# Patient Record
Sex: Female | Born: 1945 | Race: White | Hispanic: No | Smoking: Never smoker
Health system: Southern US, Community
[De-identification: ages and names within clinical notes are randomized; demographics above are authoritative.]

## PROBLEM LIST (undated history)

## (undated) DIAGNOSIS — E785 Hyperlipidemia, unspecified: Secondary | ICD-10-CM

## (undated) DIAGNOSIS — K589 Irritable bowel syndrome without diarrhea: Secondary | ICD-10-CM

## (undated) DIAGNOSIS — G43909 Migraine, unspecified, not intractable, without status migrainosus: Secondary | ICD-10-CM

## (undated) DIAGNOSIS — K5792 Diverticulitis of intestine, part unspecified, without perforation or abscess without bleeding: Secondary | ICD-10-CM

## (undated) DIAGNOSIS — R0989 Other specified symptoms and signs involving the circulatory and respiratory systems: Secondary | ICD-10-CM

## (undated) DIAGNOSIS — K7581 Nonalcoholic steatohepatitis (NASH): Secondary | ICD-10-CM

## (undated) DIAGNOSIS — I2510 Atherosclerotic heart disease of native coronary artery without angina pectoris: Secondary | ICD-10-CM

## (undated) DIAGNOSIS — E669 Obesity, unspecified: Secondary | ICD-10-CM

## (undated) DIAGNOSIS — F419 Anxiety disorder, unspecified: Secondary | ICD-10-CM

## (undated) DIAGNOSIS — R159 Full incontinence of feces: Secondary | ICD-10-CM

## (undated) DIAGNOSIS — T8859XA Other complications of anesthesia, initial encounter: Secondary | ICD-10-CM

## (undated) DIAGNOSIS — I700 Atherosclerosis of aorta: Secondary | ICD-10-CM

## (undated) DIAGNOSIS — F329 Major depressive disorder, single episode, unspecified: Secondary | ICD-10-CM

## (undated) DIAGNOSIS — Z889 Allergy status to unspecified drugs, medicaments and biological substances status: Secondary | ICD-10-CM

## (undated) DIAGNOSIS — E119 Type 2 diabetes mellitus without complications: Secondary | ICD-10-CM

## (undated) DIAGNOSIS — F32A Depression, unspecified: Secondary | ICD-10-CM

## (undated) DIAGNOSIS — K635 Polyp of colon: Secondary | ICD-10-CM

## (undated) DIAGNOSIS — D126 Benign neoplasm of colon, unspecified: Secondary | ICD-10-CM

## (undated) DIAGNOSIS — K5790 Diverticulosis of intestine, part unspecified, without perforation or abscess without bleeding: Secondary | ICD-10-CM

## (undated) DIAGNOSIS — T4145XA Adverse effect of unspecified anesthetic, initial encounter: Secondary | ICD-10-CM

## (undated) DIAGNOSIS — K219 Gastro-esophageal reflux disease without esophagitis: Secondary | ICD-10-CM

## (undated) HISTORY — DX: Depression, unspecified: F32.A

## (undated) HISTORY — DX: Atherosclerosis of aorta: I70.0

## (undated) HISTORY — DX: Anxiety disorder, unspecified: F41.9

## (undated) HISTORY — PX: BREAST LUMPECTOMY: SHX2

## (undated) HISTORY — DX: Diverticulitis of intestine, part unspecified, without perforation or abscess without bleeding: K57.92

## (undated) HISTORY — DX: Obesity, unspecified: E66.9

## (undated) HISTORY — DX: Diverticulosis of intestine, part unspecified, without perforation or abscess without bleeding: K57.90

## (undated) HISTORY — PX: INGUINAL HERNIA REPAIR: SUR1180

## (undated) HISTORY — DX: Nonalcoholic steatohepatitis (NASH): K75.81

## (undated) HISTORY — DX: Hyperlipidemia, unspecified: E78.5

## (undated) HISTORY — DX: Other specified symptoms and signs involving the circulatory and respiratory systems: R09.89

## (undated) HISTORY — PX: TONSILLECTOMY AND ADENOIDECTOMY: SUR1326

## (undated) HISTORY — DX: Allergy status to unspecified drugs, medicaments and biological substances: Z88.9

## (undated) HISTORY — DX: Full incontinence of feces: R15.9

## (undated) HISTORY — DX: Major depressive disorder, single episode, unspecified: F32.9

## (undated) HISTORY — DX: Benign neoplasm of colon, unspecified: D12.6

## (undated) HISTORY — DX: Polyp of colon: K63.5

## (undated) HISTORY — DX: Gastro-esophageal reflux disease without esophagitis: K21.9

## (undated) HISTORY — DX: Atherosclerotic heart disease of native coronary artery without angina pectoris: I25.10

## (undated) HISTORY — DX: Irritable bowel syndrome, unspecified: K58.9

---

## 1969-05-08 HISTORY — PX: EXCISIONAL HEMORRHOIDECTOMY: SHX1541

## 1969-05-08 HISTORY — PX: ABDOMINAL HYSTERECTOMY: SHX81

## 1998-02-15 ENCOUNTER — Ambulatory Visit (HOSPITAL_COMMUNITY): Admission: RE | Admit: 1998-02-15 | Discharge: 1998-02-15 | Payer: Self-pay | Admitting: Internal Medicine

## 1998-02-19 ENCOUNTER — Ambulatory Visit: Admission: RE | Admit: 1998-02-19 | Discharge: 1998-02-19 | Payer: Self-pay | Admitting: Cardiology

## 1998-02-21 ENCOUNTER — Ambulatory Visit (HOSPITAL_COMMUNITY): Admission: RE | Admit: 1998-02-21 | Discharge: 1998-02-21 | Payer: Self-pay | Admitting: Internal Medicine

## 1998-05-30 ENCOUNTER — Ambulatory Visit (HOSPITAL_COMMUNITY): Admission: RE | Admit: 1998-05-30 | Discharge: 1998-05-30 | Payer: Self-pay | Admitting: Internal Medicine

## 1998-05-30 ENCOUNTER — Encounter: Payer: Self-pay | Admitting: Internal Medicine

## 1999-01-28 ENCOUNTER — Encounter: Payer: Self-pay | Admitting: Internal Medicine

## 1999-01-28 ENCOUNTER — Ambulatory Visit (HOSPITAL_COMMUNITY): Admission: RE | Admit: 1999-01-28 | Discharge: 1999-01-28 | Payer: Self-pay | Admitting: Internal Medicine

## 1999-01-31 ENCOUNTER — Ambulatory Visit (HOSPITAL_COMMUNITY): Admission: RE | Admit: 1999-01-31 | Discharge: 1999-01-31 | Payer: Self-pay | Admitting: Internal Medicine

## 1999-01-31 ENCOUNTER — Encounter: Payer: Self-pay | Admitting: Internal Medicine

## 2000-06-08 ENCOUNTER — Ambulatory Visit (HOSPITAL_COMMUNITY): Admission: RE | Admit: 2000-06-08 | Discharge: 2000-06-08 | Payer: Self-pay

## 2000-09-23 ENCOUNTER — Ambulatory Visit (HOSPITAL_COMMUNITY): Admission: RE | Admit: 2000-09-23 | Discharge: 2000-09-23 | Payer: Self-pay | Admitting: Surgery

## 2002-06-20 ENCOUNTER — Ambulatory Visit (HOSPITAL_COMMUNITY): Admission: RE | Admit: 2002-06-20 | Discharge: 2002-06-20 | Payer: Self-pay | Admitting: Internal Medicine

## 2002-06-23 ENCOUNTER — Encounter: Admission: RE | Admit: 2002-06-23 | Discharge: 2002-06-23 | Payer: Self-pay

## 2004-04-23 ENCOUNTER — Encounter: Payer: Self-pay | Admitting: Internal Medicine

## 2004-04-24 ENCOUNTER — Encounter: Payer: Self-pay | Admitting: Internal Medicine

## 2004-09-07 HISTORY — PX: CORONARY ARTERY BYPASS GRAFT: SHX141

## 2004-09-07 HISTORY — PX: CARDIAC CATHETERIZATION: SHX172

## 2005-06-10 ENCOUNTER — Encounter: Admission: RE | Admit: 2005-06-10 | Discharge: 2005-06-10 | Payer: Self-pay | Admitting: Orthopaedic Surgery

## 2008-06-13 ENCOUNTER — Ambulatory Visit: Payer: Self-pay | Admitting: Internal Medicine

## 2008-06-13 DIAGNOSIS — R159 Full incontinence of feces: Secondary | ICD-10-CM | POA: Insufficient documentation

## 2008-06-13 LAB — CONVERTED CEMR LAB
ALT: 28 units/L (ref 0–35)
AST: 25 units/L (ref 0–37)
Albumin: 4.2 g/dL (ref 3.5–5.2)
Alkaline Phosphatase: 53 units/L (ref 39–117)
BUN: 14 mg/dL (ref 6–23)
Basophils Absolute: 0.1 10*3/uL (ref 0.0–0.1)
Basophils Relative: 1 % (ref 0.0–3.0)
CO2: 32 meq/L (ref 19–32)
Calcium: 9.7 mg/dL (ref 8.4–10.5)
Chloride: 102 meq/L (ref 96–112)
Creatinine, Ser: 0.6 mg/dL (ref 0.4–1.2)
Eosinophils Absolute: 0.4 10*3/uL (ref 0.0–0.7)
Eosinophils Relative: 6.9 % — ABNORMAL HIGH (ref 0.0–5.0)
GFR calc Af Amer: 129 mL/min
GFR calc non Af Amer: 107 mL/min
Glucose, Bld: 103 mg/dL — ABNORMAL HIGH (ref 70–99)
HCT: 42.2 % (ref 36.0–46.0)
Hemoglobin: 14.5 g/dL (ref 12.0–15.0)
Lymphocytes Relative: 27 % (ref 12.0–46.0)
MCHC: 34.4 g/dL (ref 30.0–36.0)
MCV: 90.3 fL (ref 78.0–100.0)
Monocytes Absolute: 0.8 10*3/uL (ref 0.1–1.0)
Monocytes Relative: 12.6 % — ABNORMAL HIGH (ref 3.0–12.0)
Neutro Abs: 3.2 10*3/uL (ref 1.4–7.7)
Neutrophils Relative %: 52.5 % (ref 43.0–77.0)
Platelets: 281 10*3/uL (ref 150–400)
Potassium: 4.3 meq/L (ref 3.5–5.1)
RBC: 4.67 M/uL (ref 3.87–5.11)
RDW: 12.2 % (ref 11.5–14.6)
Sodium: 144 meq/L (ref 135–145)
Total Bilirubin: 1 mg/dL (ref 0.3–1.2)
Total Protein: 7.2 g/dL (ref 6.0–8.3)
WBC: 6.2 10*3/uL (ref 4.5–10.5)

## 2008-06-19 ENCOUNTER — Ambulatory Visit (HOSPITAL_COMMUNITY): Admission: RE | Admit: 2008-06-19 | Discharge: 2008-06-19 | Payer: Self-pay | Admitting: Internal Medicine

## 2008-06-27 ENCOUNTER — Telehealth: Payer: Self-pay | Admitting: Internal Medicine

## 2008-07-03 ENCOUNTER — Telehealth: Payer: Self-pay | Admitting: Internal Medicine

## 2009-04-04 ENCOUNTER — Encounter: Admission: RE | Admit: 2009-04-04 | Discharge: 2009-04-04 | Payer: Self-pay | Admitting: Internal Medicine

## 2009-04-30 ENCOUNTER — Encounter: Admission: RE | Admit: 2009-04-30 | Discharge: 2009-04-30 | Payer: Self-pay | Admitting: Internal Medicine

## 2009-11-29 DIAGNOSIS — M76899 Other specified enthesopathies of unspecified lower limb, excluding foot: Secondary | ICD-10-CM | POA: Insufficient documentation

## 2010-04-01 ENCOUNTER — Encounter: Admission: RE | Admit: 2010-04-01 | Discharge: 2010-04-01 | Payer: Self-pay | Admitting: Orthopedic Surgery

## 2010-06-22 ENCOUNTER — Emergency Department (HOSPITAL_COMMUNITY): Admission: EM | Admit: 2010-06-22 | Discharge: 2010-06-22 | Payer: Self-pay | Admitting: Emergency Medicine

## 2010-06-26 ENCOUNTER — Ambulatory Visit: Payer: Self-pay | Admitting: Cardiovascular Disease

## 2010-07-13 ENCOUNTER — Telehealth (INDEPENDENT_AMBULATORY_CARE_PROVIDER_SITE_OTHER): Payer: Self-pay | Admitting: *Deleted

## 2010-07-26 ENCOUNTER — Emergency Department (HOSPITAL_COMMUNITY)
Admission: EM | Admit: 2010-07-26 | Discharge: 2010-07-26 | Payer: Self-pay | Source: Home / Self Care | Admitting: Emergency Medicine

## 2010-07-28 ENCOUNTER — Ambulatory Visit: Payer: Self-pay | Admitting: Cardiovascular Disease

## 2010-09-22 ENCOUNTER — Ambulatory Visit: Payer: Self-pay | Admitting: Cardiovascular Disease

## 2010-10-07 NOTE — Progress Notes (Addendum)
  Phone Note Call from Patient Call back at Home Phone [phone]   Caller: Patient Summary of Call: Pt called this evening incredibly anxious with complaints of feeling "so sick." Her husband just got out of the hospital from a stroke and has been under a lot of stress. She felt jittery with chills and overall "sick" this evening. She took her blood pressure which was 215/100. She denies CP, SOB, visual changes, or headache but pt feels generally unwell. I advised the patient to go to the ER, but she initially refused. After review of her medicines and a recheck of her blood pressure which was 216/110, again she was strongly encouraged to go to the hospital. She agreed and is calling 911.  Initial call taken by: Melina Copa PA-C

## 2010-11-18 LAB — POCT CARDIAC MARKERS
CKMB, poc: 2.6 ng/mL (ref 1.0–8.0)
Myoglobin, poc: 118 ng/mL (ref 12–200)
Troponin i, poc: <0.05 ng/mL (ref 0.00–0.09)

## 2010-11-18 LAB — DIFFERENTIAL
Basophils Absolute: 0.1 10*3/uL (ref 0.0–0.1)
Basophils Relative: 1 % (ref 0–1)
Eosinophils Absolute: 0.1 10*3/uL (ref 0.0–0.7)
Eosinophils Relative: 2 % (ref 0–5)
Lymphocytes Relative: 24 % (ref 12–46)
Lymphs Abs: 1.4 10*3/uL (ref 0.7–4.0)
Monocytes Absolute: 0.7 10*3/uL (ref 0.1–1.0)
Monocytes Relative: 13 % — ABNORMAL HIGH (ref 3–12)
Neutro Abs: 3.4 10*3/uL (ref 1.7–7.7)
Neutrophils Relative %: 61 % (ref 43–77)

## 2010-11-18 LAB — CBC
HCT: 43.1 % (ref 36.0–46.0)
Hemoglobin: 14.6 g/dL (ref 12.0–15.0)
MCH: 31.7 pg (ref 26.0–34.0)
MCHC: 33.9 g/dL (ref 30.0–36.0)
MCV: 93.5 fL (ref 78.0–100.0)
Platelets: 238 10*3/uL (ref 150–400)
RBC: 4.61 MIL/uL (ref 3.87–5.11)
RDW: 12.9 % (ref 11.5–15.5)
WBC: 5.7 10*3/uL (ref 4.0–10.5)

## 2010-11-18 LAB — POCT I-STAT, CHEM 8
BUN: 15 mg/dL (ref 6–23)
Calcium, Ion: 1.19 mmol/L (ref 1.12–1.32)
Chloride: 105 mEq/L (ref 96–112)
Creatinine, Ser: 0.6 mg/dL (ref 0.4–1.2)
Glucose, Bld: 126 mg/dL — ABNORMAL HIGH (ref 70–99)
HCT: 44 % (ref 36.0–46.0)
Hemoglobin: 15 g/dL (ref 12.0–15.0)
Potassium: 4.5 mEq/L (ref 3.5–5.1)
Sodium: 141 mEq/L (ref 135–145)
TCO2: 27 mmol/L (ref 0–100)

## 2010-11-19 LAB — DIFFERENTIAL
Basophils Absolute: 0.1 10*3/uL (ref 0.0–0.1)
Basophils Relative: 1 % (ref 0–1)
Eosinophils Absolute: 0.1 10*3/uL (ref 0.0–0.7)
Eosinophils Relative: 1 % (ref 0–5)
Lymphocytes Relative: 16 % (ref 12–46)
Lymphs Abs: 1 10*3/uL (ref 0.7–4.0)
Monocytes Absolute: 0.5 10*3/uL (ref 0.1–1.0)
Monocytes Relative: 7 % (ref 3–12)
Neutro Abs: 5 10*3/uL (ref 1.7–7.7)
Neutrophils Relative %: 75 % (ref 43–77)

## 2010-11-19 LAB — BASIC METABOLIC PANEL
BUN: 9 mg/dL (ref 6–23)
CO2: 25 mEq/L (ref 19–32)
Calcium: 9.1 mg/dL (ref 8.4–10.5)
Chloride: 100 mEq/L (ref 96–112)
Creatinine, Ser: 0.64 mg/dL (ref 0.4–1.2)
GFR calc Af Amer: >60 mL/min (ref >60)
GFR calc non Af Amer: >60 mL/min (ref >60)
Glucose, Bld: 150 mg/dL — ABNORMAL HIGH (ref 70–99)
Potassium: 3.8 mEq/L (ref 3.5–5.1)
Sodium: 134 mEq/L — ABNORMAL LOW (ref 135–145)

## 2010-11-19 LAB — CBC
HCT: 43.2 % (ref 36.0–46.0)
Hemoglobin: 15 g/dL (ref 12.0–15.0)
MCH: 31.5 pg (ref 26.0–34.0)
MCHC: 34.7 g/dL (ref 30.0–36.0)
MCV: 90.8 fL (ref 78.0–100.0)
Platelets: 200 10*3/uL (ref 150–400)
RBC: 4.76 MIL/uL (ref 3.87–5.11)
RDW: 12.6 % (ref 11.5–15.5)
WBC: 6.6 10*3/uL (ref 4.0–10.5)

## 2010-11-19 LAB — URINALYSIS, ROUTINE W REFLEX MICROSCOPIC
Bilirubin Urine: NEGATIVE
Glucose, UA: NEGATIVE mg/dL
Hgb urine dipstick: NEGATIVE
Ketones, ur: NEGATIVE mg/dL
Nitrite: NEGATIVE
Protein, ur: NEGATIVE mg/dL
Specific Gravity, Urine: 1.009 (ref 1.005–1.030)
Urobilinogen, UA: 0.2 mg/dL (ref 0.0–1.0)
pH: 7.5 (ref 5.0–8.0)

## 2010-11-19 LAB — URINE MICROSCOPIC-ADD ON

## 2010-12-24 ENCOUNTER — Telehealth: Payer: Self-pay | Admitting: *Deleted

## 2010-12-24 NOTE — Telephone Encounter (Signed)
Please call new phone number regarding her meds

## 2010-12-24 NOTE — Telephone Encounter (Signed)
Walk in with fathers app for event monitor hook up. Pt feels bp is too low,120/58. Wants you to cut her meds back. Pt dislikes EDARBI 63m and wants to take half dose. BYSTOLIC 298MKwants to take half. She only took half today. Wants ok to do half dose. Please advise. JCorwin LevinsRN

## 2010-12-24 NOTE — Telephone Encounter (Signed)
Her BP is perfect.  Please have her continue the meds at the current dose.

## 2010-12-25 ENCOUNTER — Telehealth: Payer: Self-pay | Admitting: Cardiovascular Disease

## 2010-12-25 ENCOUNTER — Other Ambulatory Visit: Payer: Self-pay | Admitting: *Deleted

## 2010-12-25 NOTE — Telephone Encounter (Signed)
Pt states the Edarbi 24m daily is making her sick on her stomach.  Her BP is ranging from  130/50 to 140/50; she is wanting to try taking 469mof the tablet.  Per Dr. NaAcie Fredricksonthis is fine.  Continue to monitor the BP;  Pt verbalized an understanding and will call back if any problems arise.

## 2010-12-25 NOTE — Telephone Encounter (Signed)
Believes her medicine is making her blood pressure drop too low, and is feeling dizzy. Taking Edarbi, dystolyc, amlodipine. The morning pill smell is making her nauseous. Please call her cell phone. I have pulled her file.

## 2010-12-29 ENCOUNTER — Encounter: Payer: Self-pay | Admitting: Cardiovascular Disease

## 2010-12-30 ENCOUNTER — Encounter: Payer: Self-pay | Admitting: Cardiovascular Disease

## 2010-12-30 ENCOUNTER — Ambulatory Visit (INDEPENDENT_AMBULATORY_CARE_PROVIDER_SITE_OTHER): Payer: Medicare Other | Admitting: Cardiovascular Disease

## 2010-12-30 VITALS — BP 152/90 | HR 66 | Ht 64.0 in | Wt 192.2 lb

## 2010-12-30 DIAGNOSIS — Z9889 Other specified postprocedural states: Secondary | ICD-10-CM

## 2010-12-30 DIAGNOSIS — E785 Hyperlipidemia, unspecified: Secondary | ICD-10-CM | POA: Insufficient documentation

## 2010-12-30 DIAGNOSIS — E119 Type 2 diabetes mellitus without complications: Secondary | ICD-10-CM

## 2010-12-30 DIAGNOSIS — I2510 Atherosclerotic heart disease of native coronary artery without angina pectoris: Secondary | ICD-10-CM | POA: Insufficient documentation

## 2010-12-30 DIAGNOSIS — I119 Hypertensive heart disease without heart failure: Secondary | ICD-10-CM | POA: Insufficient documentation

## 2010-12-30 DIAGNOSIS — I10 Essential (primary) hypertension: Secondary | ICD-10-CM

## 2010-12-30 DIAGNOSIS — Z951 Presence of aortocoronary bypass graft: Secondary | ICD-10-CM

## 2010-12-30 MED ORDER — AZILSARTAN MEDOXOMIL 80 MG PO TABS: 80.0000 mg | ORAL_TABLET | Freq: Every day | ORAL | Status: DC

## 2010-12-30 NOTE — Progress Notes (Signed)
Ruth [name] Date of Birth  May 26, 1946 [facility] [address] [place], [name]  [address] [phone]  Fax  [phone]  History of Present Illness:  Ruth [name] seems to be doing fairly well. She tried cutting her Edarbi in half her blood pressure went right back up. [name] now on the full-strength Edarbi and is on Bystolic her blood pressure readings have been well controlled.  She is under a lot less stress now that she's moved to [place] full-time. She was previously taken care of her husband who is disabled due to a stroke.  She denies any chest pain or shortness breath. She denies any dizziness or syncope.   Current Outpatient Prescriptions  Medication Sig Dispense Refill  . amLODipine (NORVASC) 2.5 MG tablet Take 2.5 mg by mouth daily.        Marland Kitchen aspirin 500 MG tablet Take 500 mg by mouth every other day.        . Azilsartan Medoxomil (EDARBI) 40 MG TABS Take 80 mg by mouth daily.   30 tablet    . Lansoprazole (PREVACID PO) Take by mouth as needed.        . Nebivolol HCl (BYSTOLIC) 20 MG TABS Take by mouth daily.           Allergies  Allergen Reactions  . Demerol   . Iohexol   . Plavix (Clopidogrel Bisulfate)   . Sulfonamide Derivatives     No past medical history on file.  No past surgical history on file.  History  Smoking status  . Never Smoker   Smokeless tobacco  . Not on file    History  Alcohol Use No    No family history on file.  Reviw of Systems:  Reviewed in the HPI.  All other systems are negative.  Physical Exam: BP 152/90  Pulse 66  Ht 5' 4"  (1.626 m)  Wt 192 lb 3.2 oz (87.181 kg)  BMI 32.99 kg/m2 The patient is alert and oriented x 3.  The mood and affect are normal.  The skin is warm and dry.  Color is normal.  The HEENT exam reveals that the sclera are nonicteric.  The mucous membranes are moist.  The carotids are 2+ without bruits.  There is no thyromegaly.  There is no JVD.  The  lungs are clear.  The chest wall is non tender.  The heart exam reveals a regular rate with a normal S1 and S2.  There are no murmurs, gallops, or rubs.  The PMI is not displaced.   Abdominal exam reveals good bowel sounds.  There is no guarding or rebound.  There is no hepatosplenomegaly or tenderness.  There are no masses.  Exam of the legs reveal no clubbing, cyanosis, or edema.  The legs are without rashes.  The distal pulses are intact.  Cranial nerves II - XII are intact.  Motor and sensory functions are intact.  The gait is normal.  Assessment / Plan:

## 2010-12-30 NOTE — Assessment & Plan Note (Signed)
She is not having any episodes of chest pain or shortness breath.

## 2010-12-30 NOTE — Assessment & Plan Note (Signed)
Ruth [name] is doing quite a bit better on her current medications. We'll continue with her same medications. I've asked her to increase her exercise and to watch her diet.  I have encouraged her to lose weight as this will make her blood pressure control much easier.

## 2011-01-26 ENCOUNTER — Other Ambulatory Visit: Payer: Self-pay | Admitting: Internal Medicine

## 2011-01-26 DIAGNOSIS — Z1231 Encounter for screening mammogram for malignant neoplasm of breast: Secondary | ICD-10-CM

## 2011-01-28 ENCOUNTER — Ambulatory Visit
Admission: RE | Admit: 2011-01-28 | Discharge: 2011-01-28 | Disposition: A | Discharge status: Home / Self Care | Payer: Medicare Other | Source: Ambulatory Visit | Attending: Internal Medicine | Admitting: Internal Medicine

## 2011-01-28 DIAGNOSIS — Z1231 Encounter for screening mammogram for malignant neoplasm of breast: Secondary | ICD-10-CM

## 2011-03-16 ENCOUNTER — Other Ambulatory Visit: Payer: Self-pay | Admitting: Cardiovascular Disease

## 2011-03-16 NOTE — Telephone Encounter (Signed)
Fax received from pharmacy. Refill completed. Corwin Levins RN

## 2011-04-29 ENCOUNTER — Telehealth: Payer: Self-pay | Admitting: Cardiovascular Disease

## 2011-04-29 NOTE — Telephone Encounter (Signed)
Samples provided for one month and i filled out assistance paperwork for her to finish and send in, msg called to her to pick up later today. TOI;[id number], [birth year]b [birth year]

## 2011-04-29 NOTE — Telephone Encounter (Signed)
Called wanting to know if we had any samples of Edarbi 80 mg available. She is going through a divorce and can not afford to purchase the medication on her own. Please call back. I have pulled her chart.

## 2011-06-29 ENCOUNTER — Ambulatory Visit (INDEPENDENT_AMBULATORY_CARE_PROVIDER_SITE_OTHER): Payer: Medicare Other | Admitting: Cardiovascular Disease

## 2011-06-29 ENCOUNTER — Encounter: Payer: Self-pay | Admitting: Cardiovascular Disease

## 2011-06-29 VITALS — BP 177/90 | HR 66 | Ht 64.0 in | Wt 192.0 lb

## 2011-06-29 DIAGNOSIS — I2510 Atherosclerotic heart disease of native coronary artery without angina pectoris: Secondary | ICD-10-CM

## 2011-06-29 DIAGNOSIS — I10 Essential (primary) hypertension: Secondary | ICD-10-CM

## 2011-06-29 DIAGNOSIS — E785 Hyperlipidemia, unspecified: Secondary | ICD-10-CM

## 2011-06-29 NOTE — Assessment & Plan Note (Addendum)
Her blood pressure readings remain just a little bit high. Overall this is fairly normal for her. [name] been doing fairly well on the dog. We'll continue to try to obtain samples for her.  [name] unable to get samples, we may have to change her to a generic medication like valsartan or Losartan

## 2011-06-29 NOTE — Assessment & Plan Note (Signed)
She has history of CAD. We'll check her lipids, basic metabolic profile, and hepatic profile at her next visit in 6 months.

## 2011-06-29 NOTE — Progress Notes (Signed)
[place] Date of Birth  04/29/1946 [facility] [address]    [place], [place]  [address] [phone]  Fax  [phone]  History of Present Illness:  65 year old female with a history of coronary artery bypass grafting and history of hypertension. His overall done fairly well. Her mother recently fell and broke her back and so she's been very busy helping her mom. [name] not been getting as much because she would like.  Her blood pressure measurements at home are typically normal.  Current Outpatient Prescriptions on File Prior to Visit  Medication Sig Dispense Refill  . amLODipine (NORVASC) 2.5 MG tablet take 1 tablet by mouth once daily  30 tablet  6  . aspirin 500 MG tablet Take 500 mg by mouth. Taking Every Other Day      . Azilsartan Medoxomil (EDARBI) 80 MG TABS Take 80 mg by mouth daily.  30 tablet  11  . Lansoprazole (PREVACID PO) Take by mouth as needed.        . Nebivolol HCl (BYSTOLIC) 20 MG TABS Take by mouth daily.          Allergies  Allergen Reactions  . Demerol   . Iohexol   . Plavix (Clopidogrel Bisulfate)   . Shrimp Flavor   . Sulfonamide Derivatives     No past medical history on file.  No past surgical history on file.  History  Smoking status  . Never Smoker   Smokeless tobacco  . Not on file    History  Alcohol Use No    No family history on file.  Reviw of Systems:  Reviewed in the HPI.  All other systems are negative.  Physical Exam: BP 177/90  Pulse 66  Ht 5' 4"  (1.626 m)  (She has not taken her by solid get today.)  The patient is alert and oriented x 3.  The mood and affect are normal.   Skin: warm and dry.  Color is normal.    HEENT:   No JVD. Her carotids are 2+.  Lungs: Lungs are clear to auscultation   Heart: Regular rate S1-S2. Her PMI is nondisplaced.    Abdomen: Abdominal exam bowel sounds. She is no hepatosplenomegaly.  Extremities:  No clubbing cyanosis or edema. She has no palpable  cords.  Neuro:  Nonfocal. Gait is normal.     ECG: Normal sinus rhythm. She has nonspecific ST-T wave changes.  Assessment / Plan:

## 2011-06-29 NOTE — Assessment & Plan Note (Signed)
She remains pain-free. We will continue with her same medications.

## 2011-06-29 NOTE — Patient Instructions (Addendum)
Your physician wants you to follow-up in: 6 months, You will receive a reminder letter in the mail two months in advance. If you don't receive a letter, please call our office to schedule the follow-up appointment.  Your physician recommends that you return for a FASTING lipid profile: 6 months  Your physician recommends that you continue on your current medications as directed. Please refer to the Current Medication list given to you today.  Gave one months of samples / edarbi 80 mg

## 2011-07-24 ENCOUNTER — Other Ambulatory Visit: Payer: Self-pay | Admitting: *Deleted

## 2011-07-24 DIAGNOSIS — I10 Essential (primary) hypertension: Secondary | ICD-10-CM

## 2011-07-24 MED ORDER — AZILSARTAN MEDOXOMIL 80 MG PO TABS: 80.0000 mg | ORAL_TABLET | Freq: Every day | ORAL | Status: DC

## 2011-07-24 NOTE — Telephone Encounter (Signed)
Adv. Refilled [name]

## 2011-07-24 NOTE — Telephone Encounter (Signed)
Refilled edarbi 52m. To [facility]

## 2011-08-04 ENCOUNTER — Telehealth: Payer: Self-pay | Admitting: Cardiovascular Disease

## 2011-08-04 NOTE — Telephone Encounter (Signed)
Patient called because her B/P has been high 204/104 this AM. After taken Bystolic medication her  B/P is 180/94. Also patient C/O of muscle cramps,she thinks is the Edarbi 80 mg. Patient  Refused to be seen by the PN, patient would like to  see  Dr. Acie Fredrickson. An appointment was made with MD  on Thursday 08/06/11 at 9:15 Am patient aware.

## 2011-08-04 NOTE — Telephone Encounter (Signed)
New message:  Pt is staying with mom at the hospital and her blood pressure is elevated.  Also she is having muscle cramps and wonder if the Edarbi would be causing this.  BP is running 204/104

## 2011-08-06 ENCOUNTER — Encounter: Payer: Self-pay | Admitting: Cardiovascular Disease

## 2011-08-06 ENCOUNTER — Ambulatory Visit (INDEPENDENT_AMBULATORY_CARE_PROVIDER_SITE_OTHER): Payer: Medicare Other | Admitting: Cardiovascular Disease

## 2011-08-06 VITALS — BP 140/80 | HR 64 | Ht 64.0 in | Wt 188.4 lb

## 2011-08-06 DIAGNOSIS — I10 Essential (primary) hypertension: Secondary | ICD-10-CM

## 2011-08-06 DIAGNOSIS — E785 Hyperlipidemia, unspecified: Secondary | ICD-10-CM

## 2011-08-06 LAB — HEPATIC FUNCTION PANEL
Alkaline Phosphatase: 54 U/L (ref 39–117)
Bilirubin, Direct: 0 mg/dL (ref 0.0–0.3)
Total Bilirubin: 0.9 mg/dL (ref 0.3–1.2)
Total Protein: 7.6 g/dL (ref 6.0–8.3)

## 2011-08-06 LAB — LIPID PANEL
HDL: 39.1 mg/dL (ref >39.00)
Total CHOL/HDL Ratio: 7
VLDL: 70.8 mg/dL — ABNORMAL HIGH (ref 0.0–40.0)

## 2011-08-06 LAB — BASIC METABOLIC PANEL
GFR: 98.21 mL/min (ref >60.00)
Potassium: 4.2 mEq/L (ref 3.5–5.1)
Sodium: 143 mEq/L (ref 135–145)

## 2011-08-06 LAB — LDL CHOLESTEROL, DIRECT: Direct LDL: 168.8 mg/dL

## 2011-08-06 MED ORDER — NEBIVOLOL HCL 20 MG PO TABS: 20.0000 mg | ORAL_TABLET | Freq: Two times a day (BID) | ORAL | Status: DC

## 2011-08-06 MED ORDER — AMLODIPINE BESYLATE 5 MG PO TABS: 5.0000 mg | ORAL_TABLET | Freq: Every day | ORAL | Status: DC

## 2011-08-06 NOTE — Progress Notes (Signed)
[place] Date of Birth  [birth year]  [facility] [address]    [place], [place]  [address] [phone]  Fax  [phone]  History of Present Illness:  65 year old female with a history of coronary artery bypass grafting and history of hypertension. His overall done fairly well. Her mother recently fell and broke her back and so she's been very busy helping her mom. [name] not been getting as much because she would like.  She has been sleeping at the [place] with her mom for the past month   Current Outpatient Prescriptions on File Prior to Visit  Medication Sig Dispense Refill  . amLODipine (NORVASC) 2.5 MG tablet take 1 tablet by mouth once daily  30 tablet  6  . aspirin 500 MG tablet Take 500 mg by mouth. Taking Twice a Week      . Azilsartan Medoxomil (EDARBI) 80 MG TABS Take 1 tablet (80 mg total) by mouth daily.  30 tablet  11  . Lansoprazole (PREVACID PO) Take by mouth as needed.        . Nebivolol HCl (BYSTOLIC) 20 MG TABS Take by mouth daily.          Allergies  Allergen Reactions  . Demerol   . Iohexol   . Plavix (Clopidogrel Bisulfate)   . Red Dye   . Shrimp Flavor   . Sulfonamide Derivatives     No past medical history on file.  No past surgical history on file.  History  Smoking status  . Never Smoker   Smokeless tobacco  . Not on file    History  Alcohol Use No    No family history on file.  Reviw of Systems:  Reviewed in the HPI.  All other systems are negative.  Physical Exam: BP 140/80  Pulse 64  Ht 5' 4"  (1.626 m)  Wt 188 lb 6.4 oz (85.458 kg)  BMI 32.34 kg/m2  (She has not taken her by solid get today.)  The patient is alert and oriented x 3.  The mood and affect are normal.   Skin: warm and dry.  Color is normal.    HEENT:   No JVD. Her carotids are 2+.  Lungs: Lungs are clear to auscultation   Heart: Regular rate S1-S2. Her PMI is nondisplaced.    Abdomen: Abdominal exam bowel sounds. She is no  hepatosplenomegaly.  Extremities:  No clubbing cyanosis or edema. She has no palpable cords.  Neuro:  Nonfocal. Gait is normal.    ECG: Assessment / Plan:

## 2011-08-06 NOTE — Assessment & Plan Note (Signed)
[name] not tolerating her ARB quite as well as she used to. We'll increase her Norvasc to 5 mg a day and will increase her Bystolic to 20 mg  twice a day.  I'll see her again in 3 months.  I have told her that she needs to rest. She may need to stay at her house rather than the hospital so that she gets enough rest.  She needs to exercise

## 2011-08-06 NOTE — Patient Instructions (Signed)
Your physician recommends that you schedule a follow-up appointment in: 3 months app made  Your physician has recommended you make the following change in your medication:  Stop edarbi Start bystolic 20 mg one tab twice daily Increase norvasc to 5 mg daily

## 2011-08-07 NOTE — Progress Notes (Signed)
Pt states she will work on diet and exercise but is not willing to start Lipitor.  She states "it's not going to happen" regarding the Lipitor.

## 2011-08-09 ENCOUNTER — Telehealth: Payer: Self-pay | Admitting: Physician Assistant

## 2011-08-09 NOTE — Telephone Encounter (Signed)
The patient called because she was having chest pain. The pain is in her upper left chest close to her left shoulder. She feels that this is gas. She took a Tums which only helped a little. She was concerned. The pain seems to get worse with deep inspiration. She denies any exertional symptoms. She is not short of breath. She wanted to know what to do. I advised her that she could call 911 and come to the emergency room but she stated she could not leave her mother. I then asked her what medications she had that she could take at home and we discussed over-the-counter GI medications. She wanted to know she could take Prevacid. I advised her that this was slower in onset than medication such as Mylanta but she could take it. Patient stated the pain was not like her previous cardiac symptoms. The patient seemed reassured and I advised we would contact her on Monday for followup.

## 2011-11-05 ENCOUNTER — Encounter: Payer: Self-pay | Admitting: Cardiovascular Disease

## 2011-11-05 ENCOUNTER — Ambulatory Visit (INDEPENDENT_AMBULATORY_CARE_PROVIDER_SITE_OTHER): Payer: Medicare Other | Admitting: Cardiovascular Disease

## 2011-11-05 DIAGNOSIS — I2510 Atherosclerotic heart disease of native coronary artery without angina pectoris: Secondary | ICD-10-CM

## 2011-11-05 DIAGNOSIS — I10 Essential (primary) hypertension: Secondary | ICD-10-CM

## 2011-11-05 DIAGNOSIS — R252 Cramp and spasm: Secondary | ICD-10-CM

## 2011-11-05 LAB — MAGNESIUM: Magnesium: 1.9 mg/dL (ref 1.5–2.5)

## 2011-11-05 LAB — BASIC METABOLIC PANEL
BUN: 14 mg/dL (ref 6–23)
Calcium: 9.8 mg/dL (ref 8.4–10.5)
GFR: 133.56 mL/min (ref >60.00)
Glucose, Bld: 107 mg/dL — ABNORMAL HIGH (ref 70–99)
Potassium: 4.4 mEq/L (ref 3.5–5.1)
Sodium: 140 mEq/L (ref 135–145)

## 2011-11-05 NOTE — Assessment & Plan Note (Signed)
She status post coronary artery bypass grafting. [name] not having any angina.

## 2011-11-05 NOTE — Progress Notes (Signed)
    Ruth [name] Date of Birth  05/12/1946 [address]    [place]   [place], [place]  [address]    [place], [place]  [address] [phone]  Fax  [phone]  [phone]  Fax [phone]  Problem list: 1. Coronary artery disease-status post CABG- 2006 2. Hypertension 3. Hypercholesterolemia   History of Present Illness:  Ruth [name] has done fairly well since I last saw her. [name] been under lots of stress. Her husband died this past Christmas. [name] also had patent help her mother who is not in good health right now.  [name] been eating a little bit of extra salt because she's been traveling so much and has not had time to cook for herself.  [name] been having some leg cramps particularly at night.  Current Outpatient Prescriptions on File Prior to Visit  Medication Sig Dispense Refill  . amLODipine (NORVASC) 5 MG tablet Take 1 tablet (5 mg total) by mouth daily.  30 tablet  11  . aspirin 500 MG tablet Take 500 mg by mouth. Taking Twice a Week      . Lansoprazole (PREVACID PO) Take by mouth as needed.        . Nebivolol HCl (BYSTOLIC) 20 MG TABS Take 1 tablet (20 mg total) by mouth 2 (two) times daily.  60 tablet  11    Allergies  Allergen Reactions  . Demerol   . Iohexol   . Plavix (Clopidogrel Bisulfate)   . Red Dye   . Shrimp Flavor   . Sulfonamide Derivatives     No past medical history on file.  No past surgical history on file.  History  Smoking status  . Never Smoker   Smokeless tobacco  . Not on file    History  Alcohol Use No    No family history on file.  Reviw of Systems:  Reviewed in the HPI.  All other systems are negative.  Physical Exam: Blood pressure 140/85, pulse 67, height 5' 3.5" (1.613 m), weight 187 lb 12.8 oz (85.186 kg). General: Well developed, well nourished, in no acute distress.  Head: Normocephalic, atraumatic, sclera non-icteric, mucus membranes are moist,    Neck: Supple. Carotids are 2 + without bruits. No JVD  Lungs: Clear bilaterally to auscultation.  Heart: regular rate  With normal  S1 S2. No murmurs, gallops or rubs.  Abdomen: Soft, non-tender, non-distended with normal bowel sounds. No hepatomegaly. No rebound/guarding. No masses.  Msk:  Strength and tone are normal  Extremities: No clubbing or cyanosis. No edema.  Distal pedal pulses are 2+ and equal bilaterally.  Neuro: Alert and oriented X 3. Moves all extremities spontaneously.  Psych:  Responds to questions appropriately with a normal affect.  ECG:  Assessment / Plan:

## 2011-11-05 NOTE — Patient Instructions (Signed)
Your physician wants you to follow-up in: 6 months  You will receive a reminder letter in the mail two months in advance. If you don't receive a letter, please call our office to schedule the follow-up appointment.   Your physician recommends that you return for lab work in: today   Your physician recommends that you return for a FASTING lipid profile: 6 months

## 2011-11-05 NOTE — Assessment & Plan Note (Signed)
Ruth [name] is doing quite well. [name] not had any further episodes of chest pain. Her blood pressures little elevated when she first comes into the office but it tends to get down quite nicely after she has had here for a little while. She'll continue to exercise on her regular basis.

## 2011-11-24 DIAGNOSIS — M791 Myalgia, unspecified site: Secondary | ICD-10-CM | POA: Insufficient documentation

## 2012-01-11 ENCOUNTER — Other Ambulatory Visit: Payer: Self-pay | Admitting: Internal Medicine

## 2012-01-11 DIAGNOSIS — R3129 Other microscopic hematuria: Secondary | ICD-10-CM | POA: Insufficient documentation

## 2012-01-11 DIAGNOSIS — N63 Unspecified lump in unspecified breast: Secondary | ICD-10-CM | POA: Insufficient documentation

## 2012-01-13 ENCOUNTER — Ambulatory Visit
Admission: RE | Admit: 2012-01-13 | Discharge: 2012-01-13 | Disposition: A | Discharge status: Home / Self Care | Payer: Medicare Other | Source: Ambulatory Visit | Attending: Internal Medicine | Admitting: Internal Medicine

## 2012-01-13 ENCOUNTER — Other Ambulatory Visit: Payer: Self-pay | Admitting: Internal Medicine

## 2012-01-13 ENCOUNTER — Encounter: Payer: Self-pay | Admitting: *Deleted

## 2012-03-01 ENCOUNTER — Telehealth: Payer: Self-pay | Admitting: Cardiovascular Disease

## 2012-03-01 NOTE — Telephone Encounter (Signed)
Samples avail, pt informed

## 2012-03-01 NOTE — Telephone Encounter (Signed)
New msg Pt wants samples of bystolic 20 mg. She will be in [place] today

## 2012-05-16 ENCOUNTER — Encounter: Payer: Self-pay | Admitting: Cardiovascular Disease

## 2012-05-20 ENCOUNTER — Telehealth: Payer: Self-pay | Admitting: Internal Medicine

## 2012-05-20 NOTE — Telephone Encounter (Signed)
Dr. Olevia Perches the patient would like to change to you because the rest of her family see you.  Will you accept?

## 2012-05-20 NOTE — Telephone Encounter (Signed)
Dr. Gessner do you approve of change? 

## 2012-05-20 NOTE — Telephone Encounter (Signed)
OK 

## 2012-05-20 NOTE — Telephone Encounter (Signed)
ok 

## 2012-05-23 ENCOUNTER — Encounter: Payer: Self-pay | Admitting: Internal Medicine

## 2012-06-06 ENCOUNTER — Telehealth: Payer: Self-pay | Admitting: Internal Medicine

## 2012-06-06 ENCOUNTER — Encounter: Payer: Self-pay | Admitting: *Deleted

## 2012-06-06 NOTE — Telephone Encounter (Signed)
Spoke with patient and she states she has had left sided abdominal pain for several weeks and alternates with diarrhea and constipation. She also reports stomach "swelling." States she has IBS.  Offered patient appointment today with extender which she refused. States" I only want to see Dr. Olevia Perches because she sees my father." Scheduled patient on 06/21/12 at 10:00 AM. Patient understands to call back if she is worse.

## 2012-06-07 ENCOUNTER — Telehealth: Payer: Self-pay | Admitting: Cardiovascular Disease

## 2012-06-07 NOTE — Telephone Encounter (Signed)
lmtcb,

## 2012-06-07 NOTE — Telephone Encounter (Signed)
New Problem:    Patient called because she feels like she has gas, is belching, and has some chest pains.  Patient is unsure if this has to do with her heart.  Patient believes that it has to do with her stomach but would like to be seen asap to be evaluated.  When offered an appointment with a NP/PA, patient refused to be seen by anyone but her physician.  Please call back.

## 2012-06-08 ENCOUNTER — Telehealth: Payer: Self-pay | Admitting: Cardiovascular Disease

## 2012-06-08 NOTE — Telephone Encounter (Signed)
Pt refuses to be seen sooner by PA/NP, pt told to go to ER with worsening symptoms, pt states" I think its just gas but I'm not sure" has app to establish with Dr Olevia Perches also for stomach upset. Describes gas/ belching/ fullness that goes into chest and arms. Tried to convince her to be seen by another provider, Pt declined. Will route to Dr Acie Fredrickson

## 2012-06-08 NOTE — Telephone Encounter (Signed)
Pt rtn call to jodette from yesterday

## 2012-06-14 ENCOUNTER — Telehealth: Payer: Self-pay | Admitting: Internal Medicine

## 2012-06-14 NOTE — Telephone Encounter (Signed)
Moved patient to Tye Savoy, NP on 06/15/12 at 10:30 AM.

## 2012-06-15 ENCOUNTER — Ambulatory Visit (INDEPENDENT_AMBULATORY_CARE_PROVIDER_SITE_OTHER): Payer: Medicare Other | Admitting: Nurse Practitioner

## 2012-06-15 ENCOUNTER — Encounter: Payer: Self-pay | Admitting: Nurse Practitioner

## 2012-06-15 VITALS — BP 164/98 | HR 60 | Ht 63.5 in | Wt 193.0 lb

## 2012-06-15 DIAGNOSIS — R140 Abdominal distension (gaseous): Secondary | ICD-10-CM | POA: Insufficient documentation

## 2012-06-15 DIAGNOSIS — Z800 Family history of malignant neoplasm of digestive organs: Secondary | ICD-10-CM

## 2012-06-15 DIAGNOSIS — R1084 Generalized abdominal pain: Secondary | ICD-10-CM | POA: Insufficient documentation

## 2012-06-15 DIAGNOSIS — R143 Flatulence: Secondary | ICD-10-CM

## 2012-06-15 MED ORDER — HYOSCYAMINE SULFATE 0.125 MG PO TABS: ORAL_TABLET | ORAL | Status: DC

## 2012-06-15 MED ORDER — ALIGN PO CAPS: 1.0000 | ORAL_CAPSULE | Freq: Every day | ORAL | Status: DC

## 2012-06-15 NOTE — Patient Instructions (Addendum)
Please call us if you are able to find the most recent colonoscopy information.  We would need a copy of it. We have had you sign a release of information form . Call us with the contact information and we will fax it to them.    We have given you samples of a probiotic Align, take 1 capsule daily for 14 days. We sent a prescription for Hyoscyamine tablets for cramping to [facility].

## 2012-06-15 NOTE — Progress Notes (Signed)
06/15/2012 Ruth [name] [id number] [birth year]   HISTORY OF PRESENT ILLNESS: Patient is a 66 year old female who saw Dr. Carlean Purl October 2009 for evaluation of chronic left lower quadrant pain. She  wanted to be seen today for abdominal pain, distention, gas and a clear discharge from her rectum. Her symptoms started 3 weeks ago. Her bowel movements are unchanged in consistency but she does often have a hard time expelling the stool when she tries to have a BM. Other times she is incontinent of stool, both liquid and formed. Abdominal pain is constant, diffuse, it is worse with meals. She does get some relief with defecation. No nausea or vomiting. She has chronic LLQ pain but this is different in that it is more diffuse. She has gas in her chest and has recently started OTC Prevacid and it helping.     Patient has a history of colon cancer in her father. We have her colonoscopy report from [place] done June 2005 with findings of diverticulosis and a small sessile polyp removed from the transverse colon. Patient believes she had a repeat colonoscopy at [facility] approximately 4 years ago, I do not have those results.    Past Medical History  Diagnosis Date  . CAD (coronary artery disease)   . Labile hypertension   . Dyslipidemia   . Diabetes mellitus   . Steatohepatitis   . Diverticulosis   . IBS (irritable bowel syndrome)   . Obesity   . Hyperplastic colon polyp   . Fecal incontinence    Past Surgical History  Procedure Date  . Coronary artery bypass graft 2006  . Inguinal hernia repair   . Partial hysterectomy   . Hemorrhoid surgery   . Breast lumpectomy     bilateral  . Head lumpectomy     reports that she has never smoked. She has never used smokeless tobacco. She reports that she does not drink alcohol or use illicit drugs. family history includes Breast cancer in an unspecified family member; Colon cancer in her father; Colon polyps in her father; Coronary artery  disease in her father; Diabetes in her maternal grandmother and paternal grandmother; and Hypertension in an unspecified family member. Allergies  Allergen Reactions  . Codeine Anaphylaxis  . Demerol   . Iohexol   . Plavix (Clopidogrel Bisulfate)   . Red Dye   . Shrimp Flavor   . Sulfonamide Derivatives       Outpatient Encounter Prescriptions as of 06/15/2012  Medication Sig Dispense Refill  . amLODipine (NORVASC) 5 MG tablet Take 1 tablet (5 mg total) by mouth daily.  30 tablet  11  . aspirin 500 MG tablet Take 500 mg by mouth. Taking Twice a Week      . calcium carbonate (TUMS - DOSED IN MG ELEMENTAL CALCIUM) 500 MG chewable tablet Chew 1 tablet by mouth as needed.      . lansoprazole (PREVACID) 15 MG capsule Take 15 mg by mouth daily.      . Nebivolol HCl (BYSTOLIC) 20 MG TABS Take 1 tablet (20 mg total) by mouth 2 (two) times daily.  60 tablet  11  . DISCONTD: Lansoprazole (PREVACID PO) Take by mouth as needed.           REVIEW OF SYSTEMS  : Positive for anxiety. Positive for swelling in left neck.  All other systems reviewed and negative except where noted in the History of Present Illness.   PHYSICAL EXAM: BP 164/98  Pulse 60  Ht 5' 3.5" (  1.613 m)  Wt 193 lb (87.544 kg)  BMI 33.65 kg/m2 General: Well developed white female in no acute distress Head: Normocephalic and atraumatic Eyes:  sclerae anicteric,conjunctive pink. Ears: Normal auditory acuity Neck: Supple, no masses felt. Bilateral clavicular fat pads, L>R. Lungs: Clear throughout to auscultation Heart: Regular rate and rhythm Abdomen: Soft, obese, non distended. Mild left lower quadrant tenderness to palpation No masses or hepatomegaly noted. Normal bowel sounds Rectal: 2-3 inch superficial abrasion on in her right buttock, several old hemorrhoidal tags. Minimal light brown stool in vault which is heme-negative. Decreased sphincter tone. Musculoskeletal: Symmetrical with no gross deformities  Skin: No lesions  on visible extremities Extremities: No edema  Neurological: Alert oriented x 4, grossly nonfocal Cervical Nodes:  No significant cervical adenopathy Psychological:  Anxious and emotional but pleasant.   ASSESSMENT AND PLAN:  1. Chronic LLQ pain, unchanged  2. Three week history of postprandial bloating, diffuse abdominal discomfort. Her abdominal exam is not overly concerning. Patient has a history of irritable bowel syndrome, she is under significant stress with two ill parents. Suspect her GI symptoms are functional in nature. She has diabetes, small bowel bacterial overgrowth is a possibility. Will try a course of align. If this is ineffective will consider Xifaxan. For discomfort patient will try Hyoscyamine. She will return for a followup visit with Dr. Olevia Perches in a month or so.  2. Diverticulosis  3. [facility] of colon cancer in father in his 68's. Patient had a colonoscopy in [place] in 2005, see HPI. Patient believes she may have had a colonoscopy at [facility] about 4 years ago, I do not have those results. I asked patient to find out whether she had a colonoscopy and get the results to [place]. If her last colonoscopy was in 2005 then she is due for a surveillance colonoscopy given family history of colon cancer in father.  4. multichemical sensitivity. Multiple medication allergies  as well.   5. Anxiety

## 2012-06-16 DIAGNOSIS — K649 Unspecified hemorrhoids: Secondary | ICD-10-CM | POA: Insufficient documentation

## 2012-06-16 DIAGNOSIS — L98499 Non-pressure chronic ulcer of skin of other sites with unspecified severity: Secondary | ICD-10-CM | POA: Insufficient documentation

## 2012-06-17 ENCOUNTER — Encounter: Payer: Self-pay | Admitting: Nurse Practitioner

## 2012-06-20 ENCOUNTER — Encounter: Payer: Self-pay | Admitting: Cardiovascular Disease

## 2012-06-20 ENCOUNTER — Ambulatory Visit (INDEPENDENT_AMBULATORY_CARE_PROVIDER_SITE_OTHER): Payer: Medicare Other | Admitting: Cardiovascular Disease

## 2012-06-20 VITALS — BP 148/80 | HR 76 | Ht 63.5 in | Wt 195.8 lb

## 2012-06-20 DIAGNOSIS — I10 Essential (primary) hypertension: Secondary | ICD-10-CM

## 2012-06-20 DIAGNOSIS — E785 Hyperlipidemia, unspecified: Secondary | ICD-10-CM

## 2012-06-20 DIAGNOSIS — I2510 Atherosclerotic heart disease of native coronary artery without angina pectoris: Secondary | ICD-10-CM

## 2012-06-20 DIAGNOSIS — E119 Type 2 diabetes mellitus without complications: Secondary | ICD-10-CM

## 2012-06-20 MED ORDER — AMLODIPINE BESYLATE 5 MG PO TABS: 2.5000 mg | ORAL_TABLET | Freq: Every day | ORAL | Status: DC

## 2012-06-20 NOTE — Assessment & Plan Note (Signed)
Ruth [name] is doing well. [name] not had any episodes of chest pain. We'll continue with her same medications. We'll have her return sometime this week for fasting labs.

## 2012-06-20 NOTE — Assessment & Plan Note (Signed)
Her blood pressure seems to be well-controlled. She takes a fairly complex regimen of Bystolic  but so far it seems to be working for her.

## 2012-06-20 NOTE — Patient Instructions (Addendum)
Your physician wants you to follow-up in: 6 months You will receive a reminder letter in the mail two months in advance. If you don't receive a letter, please call our office to schedule the follow-up appointment.  Your physician recommends that you return for a FASTING lipid profile: tomorrow fasting/ may drink water.

## 2012-06-20 NOTE — Progress Notes (Signed)
[name] Date of Birth  November 22, 1945 [place] [address]    [place], [place]  [address]    [place],   [id number] [phone]  Fax  [phone]  [phone]  Fax [phone]  Problem list: 1. Coronary artery disease-status post CABG- 2006 2. Hypertension 3. Hypercholesterolemia   History of Present Illness:  Kenslee has done fairly well since I last saw her. [name] been under lots of stress. Her husband died this past Christmas. [name] also had patent help her mother who is not in good health right now.  [name] been eating a little bit of extra salt because she's been traveling so much and has not had time to cook for herself.  [name] been having some leg cramps particularly at night.  She has had some episodes of HTN for which she takes an extra Norvasc.  She takes Bystolic 3 times a day for a total of 25 mg a day.  Current Outpatient Prescriptions on File Prior to Visit  Medication Sig Dispense Refill  . amLODipine (NORVASC) 5 MG tablet Take 1 tablet (5 mg total) by mouth daily.  30 tablet  11  . aspirin 500 MG tablet Take 500 mg by mouth. Taking Twice a Week      . bifidobacterium infantis (ALIGN) capsule Take 1 capsule by mouth daily.  14 capsule  0  . calcium carbonate (TUMS - DOSED IN MG ELEMENTAL CALCIUM) 500 MG chewable tablet Chew 1 tablet by mouth as needed.      . hyoscyamine (LEVSIN, ANASPAZ) 0.125 MG tablet Take 1 tab every morning as needed for cramping and abdominal spasms.  30 tablet  0  . lansoprazole (PREVACID) 15 MG capsule Take 15 mg by mouth daily.      Marland Kitchen DISCONTD: Nebivolol HCl (BYSTOLIC) 20 MG TABS Take 1 tablet (20 mg total) by mouth 2 (two) times daily.  60 tablet  11    Allergies  Allergen Reactions  . Codeine Anaphylaxis  . Demerol   . Iohexol   . Plavix (Clopidogrel Bisulfate)   . Red Dye   . Shrimp Flavor   . Sulfonamide Derivatives     Past Medical History    Diagnosis Date  . CAD (coronary artery disease)   . Labile hypertension   . Dyslipidemia   . Diabetes mellitus   . Steatohepatitis   . Diverticulosis   . IBS (irritable bowel syndrome)   . Obesity   . Hyperplastic colon polyp   . Fecal incontinence   . Multiple allergies     Past Surgical History  Procedure Date  . Coronary artery bypass graft 2006  . Inguinal hernia repair   . Partial hysterectomy   . Hemorrhoid surgery   . Breast lumpectomy     bilateral  . Head lumpectomy     History  Smoking status  . Never Smoker   Smokeless tobacco  . Never Used    History  Alcohol Use No    Family History  Problem Relation Age of Onset  . Coronary artery disease Father   . Hypertension    . Colon cancer Father   . Colon polyps Father   . Breast cancer      grandmother  . Diabetes Maternal Grandmother   . Diabetes Paternal Grandmother     Reviw of Systems:  Reviewed in the HPI.  All other systems are negative.  Physical  Exam: Blood pressure 148/80, pulse 76, height 5' 3.5" (1.613 m), weight 195 lb 12.8 oz (88.814 kg). General: Well developed, well nourished, in no acute distress.  Head: Normocephalic, atraumatic, sclera non-icteric, mucus membranes are moist,   Neck: Supple. Carotids are 2 + without bruits. No JVD  Lungs: Clear bilaterally to auscultation.  Heart: regular rate  With normal  S1 S2. No murmurs, gallops or rubs.  Abdomen: Soft, non-tender, non-distended with normal bowel sounds. No hepatomegaly. No rebound/guarding. No masses.  Msk:  Strength and tone are normal  Extremities: No clubbing or cyanosis. No edema.  Distal pedal pulses are 2+ and equal bilaterally.  Neuro: Alert and oriented X 3. Moves all extremities spontaneously.  Psych:  Responds to questions appropriately with a normal affect.  ECG: Oct. 14, 2013 - NSR at 107. No ST / T wave changes Assessment / Plan:

## 2012-06-21 ENCOUNTER — Ambulatory Visit: Payer: Medicare Other | Admitting: Internal Medicine

## 2012-06-21 ENCOUNTER — Ambulatory Visit (INDEPENDENT_AMBULATORY_CARE_PROVIDER_SITE_OTHER): Payer: Medicare Other | Admitting: *Deleted

## 2012-06-21 DIAGNOSIS — I10 Essential (primary) hypertension: Secondary | ICD-10-CM

## 2012-06-21 DIAGNOSIS — E785 Hyperlipidemia, unspecified: Secondary | ICD-10-CM

## 2012-06-21 DIAGNOSIS — I2510 Atherosclerotic heart disease of native coronary artery without angina pectoris: Secondary | ICD-10-CM

## 2012-06-21 LAB — BASIC METABOLIC PANEL
BUN: 14 mg/dL (ref 6–23)
Chloride: 101 mEq/L (ref 96–112)
Creatinine, Ser: 0.6 mg/dL (ref 0.4–1.2)

## 2012-06-21 LAB — HEPATIC FUNCTION PANEL
AST: 22 U/L (ref 0–37)
Albumin: 3.9 g/dL (ref 3.5–5.2)
Alkaline Phosphatase: 50 U/L (ref 39–117)

## 2012-06-21 LAB — LIPID PANEL
Cholesterol: 241 mg/dL — ABNORMAL HIGH (ref 0–200)
Total CHOL/HDL Ratio: 8

## 2012-06-27 ENCOUNTER — Encounter: Payer: Self-pay | Admitting: Cardiovascular Disease

## 2012-07-04 ENCOUNTER — Encounter: Payer: Self-pay | Admitting: Internal Medicine

## 2012-07-04 ENCOUNTER — Other Ambulatory Visit (INDEPENDENT_AMBULATORY_CARE_PROVIDER_SITE_OTHER): Payer: Medicare Other

## 2012-07-04 ENCOUNTER — Ambulatory Visit (INDEPENDENT_AMBULATORY_CARE_PROVIDER_SITE_OTHER): Payer: Medicare Other | Admitting: Internal Medicine

## 2012-07-04 VITALS — BP 200/104 | HR 72 | Ht 62.75 in | Wt 194.4 lb

## 2012-07-04 DIAGNOSIS — R1013 Epigastric pain: Secondary | ICD-10-CM

## 2012-07-04 MED ORDER — LANSOPRAZOLE 30 MG PO CPDR: 30.0000 mg | DELAYED_RELEASE_CAPSULE | Freq: Every day | ORAL | Status: DC

## 2012-07-04 NOTE — Progress Notes (Signed)
Ruth [name] [birth year] MRN [id number]   History of Present Illness:  This is a 66 year old white female with bloating, epigastric discomfort and pain. She saw Ruth Savoy, NP on 06/15/2012 and was started on probiotic Align,, Prevacid and Levsin SL. She took only align but not the Prevacid. She has multiple medical problems. She had coronary artery bypass graft in 2006. She also  has diabetes, fatty liver, obesity, family history of colon cancer in her father, constipation as well as diarrhea and fecal incontinence. She had a colonoscopy in [place] in 2005 which showed a polyp. A repeat colonoscopy was completed in 2010, according to the patient, at [facility], however, we have no report of that. There is a positive family history of gallbladder disease in her maternal grandmother. She denies dysphagia or odynophagia. She has multiple drug allergies and sensitivities. Her husband passed away last Christmas and she has been very upset.   Past Medical History  Diagnosis Date  . CAD (coronary artery disease)   . Labile hypertension   . Dyslipidemia   . Diabetes mellitus   . Steatohepatitis   . Diverticulosis   . IBS (irritable bowel syndrome)   . Obesity   . Hyperplastic colon polyp   . Fecal incontinence   . Multiple allergies    Past Surgical History  Procedure Date  . Coronary artery bypass graft 2006  . Inguinal hernia repair   . Partial hysterectomy   . Hemorrhoid surgery   . Breast lumpectomy     bilateral  . Head lumpectomy     reports that she has never smoked. She has never used smokeless tobacco. She reports that she does not drink alcohol or use illicit drugs. family history includes Breast cancer in an unspecified family member; Colon cancer in her father; Colon polyps in her father; Coronary artery disease in her father; Diabetes in her maternal grandmother and paternal grandmother; and Hypertension in an unspecified family member. Allergies    Allergen Reactions  . Codeine Anaphylaxis  . Demerol   . Iohexol   . Plavix (Clopidogrel Bisulfate)   . Red Dye   . Shrimp Flavor   . Sulfonamide Derivatives         Review of Systems: Bloating distention. Abdominal pain. Fecal incontinence  The remainder of the 10 point ROS is negative except as outlined in H&P   Physical Exam: General appearance  Well developed, in no distress. Eyes- non icteric. HEENT nontraumatic, normocephalic. Mouth no lesions, tongue papillated, no cheilosis. Neck supple without adenopathy, thyroid not enlarged, no carotid bruits, no JVD. Lungs Clear to auscultation bilaterally. Post thoracotomy scar Cor normal S1, normal S2, regular rhythm, no murmur,  quiet precordium. Abdomen: Protuberant soft with normoactive bowel sounds. Tenderness in epigastrium. Liver edge at costal margin tenderness in left lower quadrant. Rectal: Decreased rectal sphincter tone. Small external hemorrhoids. Soft Hemoccult negative stool. Extremities no pedal edema. Skin no lesions. Neurological alert and oriented x 3. Psychological normal mood and affect.  Assessment and Plan:  Problem #57 66 year old white female with nonspecific bloating and dyspepsia which could be multifactorial. I have asked her to take the Prevacid on a daily basis and will send her prescription for it. It could be biliary as well, so we will order an upper abdominal ultrasound. She is reluctant to undergo an upper endoscopy so we will go forward with an upper GI series instead which will show if she has a hiatal hernia.or any structural abnormality, she knows it will  not show subtle lesions.  Problem #2 Colorectal screening. We are still awaiting reports from 2010 colonoscopy. She has a positive family history of colon cancer in her father. She ought to be due for a repeat colonoscopy in 2015.  Problem #3 Decreased rectal sphincter tone, most likely postpartum. She had two large babies vaginally. She is  also a diabetic which may be negatively impacting the rectal sphincter tone. She will continue on probiotics and a high fiber diet.   07/04/2012 Ruth [name]

## 2012-07-04 NOTE — Patient Instructions (Addendum)
You have been scheduled for an Abdominal ultrasound and an Upper GI Series at [facility] (1st floor). Your appointment is on 07/11/12 at 9:00 am. Please arrive 15 minutes prior to your test for registration. Make sure not to eat or drink anything after midnight on the night before your test. If you need to reschedule, please call radiology at [phone]. ________________________________________________________________ An upper GI series uses x rays to help diagnose problems of the upper GI tract, which includes the esophagus, stomach, and duodenum. The duodenum is the first part of the small intestine. An upper GI series is conducted by a radiology technologist or a radiologist-a doctor who specializes in x-ray imaging-at a hospital or outpatient center. While sitting or standing in front of an x-ray machine, the patient drinks barium liquid, which is often white and has a chalky consistency and taste. The barium liquid coats the lining of the upper GI tract and makes signs of disease show up more clearly on x rays. X-ray video, called fluoroscopy, is used to view the barium liquid moving through the esophagus, stomach, and duodenum. Additional x rays and fluoroscopy are performed while the patient lies on an x-ray table. To fully coat the upper GI tract with barium liquid, the technologist or radiologist may press on the abdomen or ask the patient to change position. Patients hold still in various positions, allowing the technologist or radiologist to take x rays of the upper GI tract at different angles. If a technologist conducts the upper GI series, a radiologist will later examine the images to look for problems.  This test typically takes about 1 hour to complete. __________________________________________________________________ We have sent the following medications to your pharmacy for you to pick up at your convenience: [place] has requested that you go to the  basement for the following lab work before leaving today: H Pylori  We have given you samples of Align. This puts good bacteria back into your colon. You should take 1 capsule by mouth once daily. If this works well for you, it can be purchased over the counter.  CC: Dr Elta Guadeloupe Perini,Dr Cathie Olden

## 2012-07-05 ENCOUNTER — Telehealth: Payer: Self-pay | Admitting: *Deleted

## 2012-07-05 NOTE — Telephone Encounter (Signed)
Called and left a message for [facility] [employer] that we sent a signed release on 10-9 and have not received the Colon and path from 2010.  I advised I faxed the request again today 07-05-2012 and asked they please fax these records.  I also left my name and number in case they need to call me.

## 2012-07-07 ENCOUNTER — Emergency Department (HOSPITAL_COMMUNITY)
Admission: EM | Admit: 2012-07-07 | Discharge: 2012-07-08 | Disposition: A | Discharge status: Home / Self Care | Payer: Medicare Other | Attending: Emergency Medicine | Admitting: Emergency Medicine

## 2012-07-07 ENCOUNTER — Encounter (HOSPITAL_COMMUNITY): Payer: Self-pay | Admitting: *Deleted

## 2012-07-07 ENCOUNTER — Emergency Department (HOSPITAL_COMMUNITY): Payer: Medicare Other

## 2012-07-07 DIAGNOSIS — R079 Chest pain, unspecified: Secondary | ICD-10-CM | POA: Insufficient documentation

## 2012-07-07 DIAGNOSIS — Z79899 Other long term (current) drug therapy: Secondary | ICD-10-CM | POA: Insufficient documentation

## 2012-07-07 DIAGNOSIS — Z8719 Personal history of other diseases of the digestive system: Secondary | ICD-10-CM | POA: Insufficient documentation

## 2012-07-07 DIAGNOSIS — M549 Dorsalgia, unspecified: Secondary | ICD-10-CM | POA: Insufficient documentation

## 2012-07-07 DIAGNOSIS — I2510 Atherosclerotic heart disease of native coronary artery without angina pectoris: Secondary | ICD-10-CM | POA: Insufficient documentation

## 2012-07-07 DIAGNOSIS — E785 Hyperlipidemia, unspecified: Secondary | ICD-10-CM | POA: Insufficient documentation

## 2012-07-07 DIAGNOSIS — R109 Unspecified abdominal pain: Secondary | ICD-10-CM | POA: Insufficient documentation

## 2012-07-07 DIAGNOSIS — I10 Essential (primary) hypertension: Secondary | ICD-10-CM | POA: Insufficient documentation

## 2012-07-07 DIAGNOSIS — E119 Type 2 diabetes mellitus without complications: Secondary | ICD-10-CM | POA: Insufficient documentation

## 2012-07-07 DIAGNOSIS — K589 Irritable bowel syndrome without diarrhea: Secondary | ICD-10-CM | POA: Insufficient documentation

## 2012-07-07 DIAGNOSIS — Z951 Presence of aortocoronary bypass graft: Secondary | ICD-10-CM | POA: Insufficient documentation

## 2012-07-07 LAB — CBC WITH DIFFERENTIAL/PLATELET
Basophils Absolute: 0 10*3/uL (ref 0.0–0.1)
Basophils Relative: 1 % (ref 0–1)
Eosinophils Relative: 2 % (ref 0–5)
HCT: 42.8 % (ref 36.0–46.0)
MCHC: 34.8 g/dL (ref 30.0–36.0)
MCV: 89.4 fL (ref 78.0–100.0)
Monocytes Absolute: 0.6 10*3/uL (ref 0.1–1.0)
RDW: 12.5 % (ref 11.5–15.5)

## 2012-07-07 LAB — COMPREHENSIVE METABOLIC PANEL
AST: 23 U/L (ref 0–37)
Albumin: 4 g/dL (ref 3.5–5.2)
CO2: 27 mEq/L (ref 19–32)
Calcium: 10 mg/dL (ref 8.4–10.5)
Creatinine, Ser: 0.68 mg/dL (ref 0.50–1.10)
GFR calc non Af Amer: 88 mL/min — ABNORMAL LOW (ref >90)

## 2012-07-07 LAB — TROPONIN I: Troponin I: <0.3 ng/mL (ref <0.30)

## 2012-07-07 LAB — LIPASE, BLOOD: Lipase: 46 U/L (ref 11–59)

## 2012-07-07 NOTE — ED Notes (Signed)
Per EMS:  Pt has had epigastric pain and bloating for the last couple of months.  Pt started having chest pain 45 in ago (previous CABG), pt became diaphoretic and nauseous, pt became SOB.  Pt has 1g of Aspirin (1 558m this am and 1 504mwhen the pain started).  Pt refused NTG from EMS.  20g present L hand.  Pain is SSCP, pain does not radiate anywhere, pt describes pain as dull pain.

## 2012-07-07 NOTE — ED Provider Notes (Signed)
History     CSN: [id number]  Arrival date & time 07/07/12  2115   First MD Initiated Contact with Patient 07/07/12 2120      Chief Complaint - abdominal pain   Patient is a 66 y.o. female presenting with abdominal pain. The history is provided by the patient.  Abdominal Pain The primary symptoms of the illness include abdominal pain and nausea. The current episode started more than 2 days ago. The onset of the illness was gradual. The problem has been gradually worsening.  Additional symptoms associated with the illness include back pain.  eating/drinking makes symptoms worse  Pt presents with abd pain and chest pain The patient reports she has had epigastric pain for over 3 weeks - it is daily pain and worse with food She reports f/u with GI physician with abdominal US pending as outpatient She reports today her abdominal pain progressed into chest pain.  She also felt SOB and diaphoretic.  She took ASA and her BP meds and her symptoms improved She does not usually get CP on a regular basis.  Her CP is improved at this time  No fever.  No vomting.  No constipation.  No dysuria.  No rectal bleeding or melena No focal weakness At times the abdominal pain will move into her back  Past Medical History  Diagnosis Date  . CAD (coronary artery disease)   . Labile hypertension   . Dyslipidemia   . Diabetes mellitus   . Steatohepatitis   . Diverticulosis   . IBS (irritable bowel syndrome)   . Obesity   . Hyperplastic colon polyp   . Fecal incontinence   . Multiple allergies     Past Surgical History  Procedure Date  . Coronary artery bypass graft 2006  . Inguinal hernia repair   . Partial hysterectomy   . Hemorrhoid surgery   . Breast lumpectomy     bilateral  . Head lumpectomy     Family History  Problem Relation Age of Onset  . Coronary artery disease Father   . Hypertension    . Colon cancer Father   . Colon polyps Father   . Breast cancer      grandmother  .  Diabetes Maternal Grandmother   . Diabetes Paternal Grandmother     History  Substance Use Topics  . Smoking status: Never Smoker   . Smokeless tobacco: Never Used  . Alcohol Use: No    OB History    Grav Para Term Preterm Abortions TAB SAB Ect Mult Living                  Review of Systems  Gastrointestinal: Positive for nausea and abdominal pain.  Musculoskeletal: Positive for back pain.  All other systems reviewed and are negative.    Allergies  Codeine; Shrimp flavor; Demerol; Iohexol; Plavix; Red dye; and Sulfonamide derivatives  Home Medications   Current Outpatient Rx  Name Route Sig Dispense Refill  . AMLODIPINE BESYLATE 5 MG PO TABS Oral Take 0.5 tablets (2.5 mg total) by mouth daily. 30 tablet 11  . ASPIRIN 500 MG PO TABS Oral Take 500 mg by mouth. Taking Twice a Week    . ALIGN PO CAPS Oral Take 1 capsule by mouth daily. 14 capsule 0    Lot # [id number] Exp date: 06/2012  . CALCIUM CARBONATE ANTACID 500 MG PO CHEW Oral Chew 1 tablet by mouth 2 (two) times daily as needed. For heartburn    . LANSOPRAZOLE  30 MG PO CPDR Oral Take 1 capsule (30 mg total) by mouth daily. 90 capsule 3  . NEBIVOLOL HCL 5 MG PO TABS Oral Take 10-15 mg by mouth 2 (two) times daily. Take 15 mg in the morning and 20 mg at night      BP 182/85  Pulse 78  Temp 98.8 F (37.1 C)  Resp 16  SpO2 97%   Physical Exam CONSTITUTIONAL: Well developed/well nourished HEAD AND FACE: Normocephalic/atraumatic EYES: EOMI/PERRL ENMT: Mucous membranes moist NECK: supple no meningeal signs SPINE:entire spine nontender CV: S1/S2 noted, no murmurs/rubs/gallops noted LUNGS: Lungs are clear to auscultation bilaterally, no apparent distress ABDOMEN: soft, epigastric tenderness, tenderness is moderate, no rebound or guarding GU:no cva tenderness NEURO: Pt is awake/alert, moves all extremitiesx4 EXTREMITIES: pulses normal, full ROM SKIN: warm, color normal PSYCH: no abnormalities of mood  noted  ED Course  Procedures    Labs Reviewed  CBC WITH DIFFERENTIAL  COMPREHENSIVE METABOLIC PANEL  LIPASE, BLOOD  TROPONIN I   10:50 PM Pt here for worsening abd pain for over 3 weeks now with CP/SOB.  She has cardiac history with h/o CABG, so ACS is possible, however cholelithiasis is also possible.  Will initiate labs/imaging.  12:12 AM Pt improved We discussed imaging/labs.  She will continue with GI workup as outpatient including UGI as scheduled by dr Olevia Perches. Given she had CP earlier tonight and h/o CAD, would recommend repeat EKG troponin at 1230am (approximately 3 hrs after arrival and CP had resolved and approximately 4 hrs since pain started) She has had no further CP here.   It is possible that her CP was referred from abdominal pain. She can f/u with dr Acie Fredrickson next week since she had CP but do not feel she needs admission if pain does not return here and repeat labs/ekg negative Initial EKG did not show any ischemic changes D/w dr Randal Buba to f/u repeat ekg/labs   MDM  Nursing notes including past medical history and social history reviewed and considered in documentation xrays reviewed and considered Labs/vital reviewed and considered Previous records reviewed and considered - recent GI visit reviewed.  [place] ordered as outpatient        Date: 07/07/2012  Rate: 68  Rhythm: normal sinus rhythm  QRS Axis: normal  Intervals: normal  ST/T Wave abnormalities: nonspecific ST changes  Conduction Disutrbances:none  Narrative Interpretation:   Old EKG Reviewed: unchanged    Sharyon Cable, MD 07/08/12 [phone]

## 2012-07-08 ENCOUNTER — Telehealth: Payer: Self-pay | Admitting: Cardiovascular Disease

## 2012-07-08 ENCOUNTER — Telehealth: Payer: Self-pay | Admitting: Internal Medicine

## 2012-07-08 LAB — TROPONIN I: Troponin I: <0.3 ng/mL (ref <0.30)

## 2012-07-08 NOTE — Telephone Encounter (Signed)
Patient calling to let us know she went to ED last night due abdominal pain. States her BP was up too. She states she had a ultrasound done and per patient "it showed I had lots of gas."  She states she is still having abdominal pain-"feels like my stomach is on fire." Pain is increased with eating or movement. She wanted to be sure we cancelled the ultrasound scheduled on Monday since she already had one.  She is going to have the UGI series on Monday. She states she is holding on until then.

## 2012-07-08 NOTE — Telephone Encounter (Signed)
Patient notified of recommendations. 

## 2012-07-08 NOTE — Telephone Encounter (Signed)
New Problem:    Patient called in because she was seen at the ER last night because her BP was 220/120 and she wanted to know if she could increase her amLODipine (NORVASC) 5 MG tablet to the full 79m dose instead of continuing to only take 2.585m  Please call back.

## 2012-07-08 NOTE — Telephone Encounter (Signed)
I have reviewed her ultrasound. CBD is prominent. Could be gall bladder problem but her LFT"s last night were normal. Keep the appointment for UGI.

## 2012-07-08 NOTE — Telephone Encounter (Signed)
**Note De-Identified Ruth [name] Obfuscation** Pt. went to ER last night for chest/abdominal pain and states that her BP was 220/120. She is concerned that BP is too high this am at 176/88 and states that at her last OV with Dr. Acie Fredrickson on 06/27/12 she was advised to take additional 2.5 mg of Amlodipine for elevated BP. She wants to know if she should take 5 mg at one time or 2.5 mg twice daily. Pt. Is advised to take 2.5 mg this am and 2.5 mg this evening. She verbalized understanding.

## 2012-07-11 ENCOUNTER — Telehealth: Payer: Self-pay | Admitting: Internal Medicine

## 2012-07-11 ENCOUNTER — Ambulatory Visit (HOSPITAL_COMMUNITY)
Admission: RE | Admit: 2012-07-11 | Discharge: 2012-07-11 | Disposition: A | Discharge status: Home / Self Care | Payer: Medicare Other | Source: Ambulatory Visit | Attending: Internal Medicine | Admitting: Internal Medicine

## 2012-07-11 ENCOUNTER — Ambulatory Visit (HOSPITAL_COMMUNITY): Payer: Medicare Other

## 2012-07-11 DIAGNOSIS — R142 Eructation: Secondary | ICD-10-CM | POA: Insufficient documentation

## 2012-07-11 DIAGNOSIS — R141 Gas pain: Secondary | ICD-10-CM | POA: Insufficient documentation

## 2012-07-11 DIAGNOSIS — R1013 Epigastric pain: Secondary | ICD-10-CM | POA: Insufficient documentation

## 2012-07-11 NOTE — Telephone Encounter (Signed)
Spoke with patient and results given see results note.

## 2012-07-12 ENCOUNTER — Other Ambulatory Visit: Payer: Self-pay | Admitting: *Deleted

## 2012-07-12 MED ORDER — METOCLOPRAMIDE HCL 5 MG PO TABS: ORAL_TABLET | ORAL | Status: DC

## 2012-07-14 ENCOUNTER — Other Ambulatory Visit: Payer: Self-pay | Admitting: *Deleted

## 2012-07-14 DIAGNOSIS — I2510 Atherosclerotic heart disease of native coronary artery without angina pectoris: Secondary | ICD-10-CM

## 2012-07-14 DIAGNOSIS — R079 Chest pain, unspecified: Secondary | ICD-10-CM

## 2012-07-14 NOTE — Progress Notes (Signed)
PT WAS WITH FAMILY MEMBER FOR OV, DURING OV SHE MENTIONED SHE HAD CP AND WAS SEEN IN ER, DR NAHSER ORDERED  STRESS TEST.

## 2012-07-15 ENCOUNTER — Ambulatory Visit: Payer: Medicare Other | Admitting: Cardiovascular Disease

## 2012-07-19 ENCOUNTER — Telehealth: Payer: Self-pay | Admitting: Cardiovascular Disease

## 2012-07-19 ENCOUNTER — Ambulatory Visit: Payer: Medicare Other | Admitting: Internal Medicine

## 2012-07-19 NOTE — Telephone Encounter (Signed)
Pt has elevated 220/109 and she is very concerned and wants to talk to someone

## 2012-07-19 NOTE — Telephone Encounter (Signed)
Pt informed and will accept plan but will call back if she feels meds need to be changed.

## 2012-07-19 NOTE — Telephone Encounter (Signed)
No changes, continue to take an extra amlodipine on occasion.

## 2012-07-19 NOTE — Telephone Encounter (Signed)
Spoke with pt. Pt states her BP was elevated last night, the highest reading was 220/106. Pt took extra 44m of amlodipine and her BP has gradually come down. This morning her BP was 125/72. Pt is not having any symptoms this morning except her stomach hurts. She feels this may be related to bystolic. Pt usually takes bystolic 106UJ AM , 134MG6 PM , and amlodipine 2.5 around 10:30pm. Pt feels she is having side effects from bystolic and would like to know if it can be changed to another medication. She is also asking if other adjustments should be made to her medication given her recent high BP readings. Pt requesting try cell phone [phone] not at home number. I will forward to Dr NAcie Fredricksonfor review and recommendations.

## 2012-07-21 ENCOUNTER — Ambulatory Visit (HOSPITAL_COMMUNITY): Payer: Medicare Other | Attending: Cardiovascular Disease | Admitting: Radiology

## 2012-07-21 VITALS — BP 191/87 | HR 72 | Ht 63.0 in | Wt 188.0 lb

## 2012-07-21 DIAGNOSIS — I2510 Atherosclerotic heart disease of native coronary artery without angina pectoris: Secondary | ICD-10-CM

## 2012-07-21 DIAGNOSIS — E119 Type 2 diabetes mellitus without complications: Secondary | ICD-10-CM | POA: Insufficient documentation

## 2012-07-21 DIAGNOSIS — I10 Essential (primary) hypertension: Secondary | ICD-10-CM | POA: Insufficient documentation

## 2012-07-21 DIAGNOSIS — I999 Unspecified disorder of circulatory system: Secondary | ICD-10-CM | POA: Insufficient documentation

## 2012-07-21 DIAGNOSIS — R079 Chest pain, unspecified: Secondary | ICD-10-CM

## 2012-07-21 DIAGNOSIS — R0789 Other chest pain: Secondary | ICD-10-CM | POA: Insufficient documentation

## 2012-07-21 DIAGNOSIS — R0602 Shortness of breath: Secondary | ICD-10-CM | POA: Insufficient documentation

## 2012-07-21 DIAGNOSIS — Z8249 Family history of ischemic heart disease and other diseases of the circulatory system: Secondary | ICD-10-CM | POA: Insufficient documentation

## 2012-07-21 MED ORDER — TECHNETIUM TC 99M SESTAMIBI GENERIC - CARDIOLITE
10.0000 | Freq: Once | INTRAVENOUS | Status: AC | PRN
  Administered 2012-07-21: 10 via INTRAVENOUS

## 2012-07-21 MED ORDER — TECHNETIUM TC 99M SESTAMIBI GENERIC - CARDIOLITE
30.0000 | Freq: Once | INTRAVENOUS | Status: AC | PRN
  Administered 2012-07-21: 30 via INTRAVENOUS

## 2012-07-21 MED ORDER — REGADENOSON 0.4 MG/5ML IV SOLN
0.4000 mg | Freq: Once | INTRAVENOUS | Status: AC
  Administered 2012-07-21: 0.4 mg via INTRAVENOUS

## 2012-07-21 NOTE — Progress Notes (Signed)
[facility] [id number] [place] [address] [phone]  Cardiology Nuclear Med Study  Ruth [name] is a 66 y.o. female     MRN : [id number]     DOB: 04/23/1946  Procedure Date: 07/21/2012  Nuclear Med Background Indication for Stress Test:  Evaluation for Ischemia and Graft Patency History:  '06 CABG; '10 Normal, EF=62%; 07/07/12 [facility] with Chest/Abdominal Pain Cardiac Risk Factors: Family History - CAD, Hypertension, Lipids, NIDDM and Obesity  Symptoms:  Chest Pressure/Tightness with and without Exertion (last episode of chest discomfort is now, 2/10), Diaphoresis, DOE/SOB and Nausea    Nuclear Pre-Procedure Caffeine/Decaff Intake:  None NPO After: 6:00pm   Lungs:  Clear. O2 Sat: 97% on room air. IV 0.9% NS with Angio Cath:  22g  IV Site: L Antecubital  IV Started by:  Perrin Maltese, EMT-P  Chest Size (in):  38 Cup Size: D  Height: 5' 3"  (1.6 m)  Weight:  188 lb (85.276 kg)  BMI:  Body mass index is 33.30 kg/(m^2). Tech Comments:  Bystolic held x 24 hours    Nuclear Med Study 1 or 2 day study: 1 day  Stress Test Type:  Lexiscan  Reading MD: Mertie Moores, MD  Order Authorizing Provider:  Mertie Moores, MD  Resting Radionuclide: Technetium 51mSestamibi  Resting Radionuclide Dose: 11.0 mCi   Stress Radionuclide:  Technetium 96mestamibi  Stress Radionuclide Dose: 32.8 mCi           Stress Protocol Rest HR: 72 Stress HR: 109  Rest BP: 191/87 Stress BP: 184/70  Exercise Time (min): n/a METS: n/a   Predicted Max HR: 152 bpm % Max HR: 71.71 bpm Rate Pressure Product: 20056   Dose of Adenosine (mg):  n/a Dose of Lexiscan: 0.4 mg  Dose of Atropine (mg): n/a Dose of Dobutamine: n/a mcg/kg/min (at max HR)  Stress Test Technologist: ShLetta MoynahanCMA-N  Nuclear Technologist:  StCharlton AmorCNMT     Rest Procedure:  Myocardial perfusion imaging was performed at rest 45 minutes following the intravenous  administration of Technetium 9969mstamibi.  Rest ECG: No acute changes.  Stress Procedure:  The patient received IV Lexiscan 0.4 mg over 15-seconds.  Technetium 47m28mtamibi was injected at 30-seconds.  There were nonspecific T-wave changes and rare PAC's with Lexiscan.  She did c/o chest pain with Lexiscan.  Quantitative spect images were obtained after a 45- minute delay.  Stress ECG: No significant change from baseline ECG  QPS Raw Data Images:  Normal; no motion artifact; normal heart/lung ratio. Stress Images:  There is a large moderately severe defect in the mid-basal inferolateral wall.            Rest Images:  Normal homogeneous uptake in all areas of the myocardium. Subtraction (SDS):  There is evidence of a large area of moderatly severe ischemia in the mid-basal inferolateral wall    Transient Ischemic Dilatation (Normal <1.22):  1.06 Lung/Heart Ratio (Normal <0.45):  0.45  Quantitative Gated Spect Images QGS EDV:  70 ml QGS ESV:  20 ml  Impression Exercise Capacity:  Lexiscan with no exercise. BP Response:  Normal blood pressure response. Clinical Symptoms:  No significant symptoms noted. ECG Impression:  No significant ST segment change suggestive of ischemia. Comparison with Prior Nuclear Study: No images to compare  Overall Impression:  Abnormal stress nuclear study.  She has had episodes of angina.  She has a large area of moderate ischemia in the  lateral wall.   LV Ejection Fraction: 71%.  LV Wall Motion:  NL LV Function; NL Wall Motion.    Thayer Headings, Brooke Bonito., MD, [facility] 07/21/2012, 6:03 PM Office - [phone] Pager [phone]

## 2012-07-26 ENCOUNTER — Encounter (HOSPITAL_COMMUNITY): Payer: Self-pay | Admitting: Pharmacy Technician

## 2012-07-26 ENCOUNTER — Encounter: Payer: Self-pay | Admitting: Nurse Practitioner

## 2012-07-26 ENCOUNTER — Telehealth: Payer: Self-pay | Admitting: Cardiovascular Disease

## 2012-07-26 ENCOUNTER — Encounter: Payer: Self-pay | Admitting: *Deleted

## 2012-07-26 ENCOUNTER — Ambulatory Visit (INDEPENDENT_AMBULATORY_CARE_PROVIDER_SITE_OTHER): Payer: Medicare Other | Admitting: Nurse Practitioner

## 2012-07-26 VITALS — BP 180/90 | HR 80 | Resp 20 | Ht 63.5 in | Wt 192.0 lb

## 2012-07-26 DIAGNOSIS — R079 Chest pain, unspecified: Secondary | ICD-10-CM

## 2012-07-26 LAB — BASIC METABOLIC PANEL
BUN: 18 mg/dL (ref 6–23)
CO2: 29 mEq/L (ref 19–32)
Calcium: 9.7 mg/dL (ref 8.4–10.5)
Chloride: 100 mEq/L (ref 96–112)
Creatinine, Ser: 0.8 mg/dL (ref 0.4–1.2)
GFR: 73.57 mL/min (ref >60.00)
Glucose, Bld: 109 mg/dL — ABNORMAL HIGH (ref 70–99)
Potassium: 3.9 mEq/L (ref 3.5–5.1)
Sodium: 138 mEq/L (ref 135–145)

## 2012-07-26 LAB — CBC WITH DIFFERENTIAL/PLATELET
Basophils Absolute: 0 10*3/uL (ref 0.0–0.1)
Basophils Relative: 0.6 % (ref 0.0–3.0)
Eosinophils Absolute: 0.1 10*3/uL (ref 0.0–0.7)
Eosinophils Relative: 1.5 % (ref 0.0–5.0)
HCT: 43.8 % (ref 36.0–46.0)
Hemoglobin: 14.8 g/dL (ref 12.0–15.0)
Lymphocytes Relative: 23.6 % (ref 12.0–46.0)
Lymphs Abs: 1.8 10*3/uL (ref 0.7–4.0)
MCHC: 33.9 g/dL (ref 30.0–36.0)
MCV: 90.6 fl (ref 78.0–100.0)
Monocytes Absolute: 0.8 10*3/uL (ref 0.1–1.0)
Monocytes Relative: 10.6 % (ref 3.0–12.0)
Neutro Abs: 4.9 10*3/uL (ref 1.4–7.7)
Neutrophils Relative %: 63.7 % (ref 43.0–77.0)
Platelets: 249 10*3/uL (ref 150.0–400.0)
RBC: 4.83 Mil/uL (ref 3.87–5.11)
RDW: 12.6 % (ref 11.5–14.6)
WBC: 7.7 10*3/uL (ref 4.5–10.5)

## 2012-07-26 LAB — PROTIME-INR
INR: 1.1 ratio — ABNORMAL HIGH (ref 0.8–1.0)
Prothrombin Time: 11.4 s (ref 10.2–12.4)

## 2012-07-26 LAB — APTT: aPTT: 28.1 s (ref 21.7–28.8)

## 2012-07-26 MED ORDER — PREDNISONE (PAK) 10 MG PO TABS: 60.0000 mg | ORAL_TABLET | Freq: Every day | ORAL | Status: DC

## 2012-07-26 NOTE — Progress Notes (Signed)
[name] Date of Birth: [birth year] Medical Record #[id number]  History of Present Illness: Ruth [name] is seen back today for a pre cath visit. She is seen for Dr. Acie Fredrickson. She has known CAD with remote CABG x 4 in 2006 in [place], [place]. Her other issues include HTN, HLD and GERD. Blood pressure has been running higher recently. Has had her medicines adjusted. She has a significant degree of anxiety.   She comes in today. She has had a stress testing due to recurrent upper abdominal pain. This is abnormal. She is quite adamant that her pain is from the [place]. This has been switched to Metoprolol earlier today. She was going to start tomorrow. She is quite anxious and under lots of stress. Trying to take care of her aging parents. Husband died last year. Blood pressure is in the 150's now at home and tends to run higher here and is higher when she gets anxious.   Current Outpatient Prescriptions on File Prior to Visit  Medication Sig Dispense Refill  . amLODipine (NORVASC) 5 MG tablet Take 0.5 tablets (2.5 mg total) by mouth daily.  30 tablet  11  . aspirin 500 MG tablet Take 500 mg by mouth. Taking Twice a Week      . bifidobacterium infantis (ALIGN) capsule Take 1 capsule by mouth daily.  14 capsule  0  . calcium carbonate (TUMS - DOSED IN MG ELEMENTAL CALCIUM) 500 MG chewable tablet Chew 1 tablet by mouth 2 (two) times daily as needed. For heartburn      . lansoprazole (PREVACID) 30 MG capsule Take 1 capsule (30 mg total) by mouth daily.  90 capsule  3  . metoprolol (LOPRESSOR) 50 MG tablet Take 50 mg by mouth 2 (two) times daily.      . metoCLOPramide (REGLAN) 5 MG tablet Take one po TID AC  60 tablet  1    Allergies  Allergen Reactions  . Codeine Anaphylaxis  . Shrimp Flavor Shortness Of Breath  . Demerol     Unknown  . Iohexol     Unknown  . Latex   . Plavix (Clopidogrel Bisulfate)     Unknown  . Red Dye     Unknown  . Sulfonamide Derivatives     Past Medical History    Diagnosis Date  . CAD (coronary artery disease)     CABG x 4 in 2006 in [place]  . Labile hypertension   . Dyslipidemia   . Diabetes mellitus   . Steatohepatitis   . Diverticulosis   . IBS (irritable bowel syndrome)   . Obesity   . Hyperplastic colon polyp   . Fecal incontinence   . Multiple allergies   . Anxiety   . Obesity     Past Surgical History  Procedure Date  . Coronary artery bypass graft 2006  . Inguinal hernia repair   . Partial hysterectomy   . Hemorrhoid surgery   . Breast lumpectomy     bilateral  . Head lumpectomy     History  Smoking status  . Never Smoker   Smokeless tobacco  . Never Used    History  Alcohol Use No    Family History  Problem Relation Age of Onset  . Coronary artery disease Father   . Colon cancer Father   . Colon polyps Father   . Heart disease Father   . Hypertension    . Breast cancer      grandmother  . Diabetes Maternal  Grandmother   . Diabetes Paternal Grandmother   . Stroke Mother     Review of Systems: The review of systems is per the HPI.  All other systems were reviewed and are negative.  Physical Exam: BP 180/90  Pulse 80  Resp 20  Ht 5' 3.5" (1.613 m)  Wt 192 lb (87.091 kg)  BMI 33.48 kg/m2 Patient is quite anxious and in no acute distress. She is tearful and crying during today's visit. Skin is warm and dry. Color is normal.  HEENT is unremarkable. Normocephalic/atraumatic. PERRL. Sclera are nonicteric. Neck is supple. No masses. No JVD. Lungs are clear. Cardiac exam shows a regular rate and rhythm. Abdomen is soft. Extremities are without edema. Gait and ROM are intact. No gross neurologic deficits noted.  LABORATORY DATA: Pending  Myoview Impression  Exercise Capacity: Lexiscan with no exercise.  BP Response: Normal blood pressure response.  Clinical Symptoms: No significant symptoms noted.  ECG Impression: No significant ST segment change suggestive of ischemia.  Comparison with Prior  Nuclear Study: No images to compare   Overall Impression: Abnormal stress nuclear study. She has had episodes of angina. She has a large area of moderate ischemia in the lateral wall.  LV Ejection Fraction: 71%. LV Wall Motion: NL LV Function; NL Wall Motion.  Ruth [name], Ruth [name]., MD, [facility]  07/21/2012, 6:03 PM  Office - [phone]  Pager [phone]   Assessment / Plan: 1. Chest pain/abdominal pain with abnormal Myoview - known CAD with prior CABG. Will proceed on with cardiac catheterization. Procedure is reviewed in detail and she is willing to proceed. She has multiple allergies including Xray dye and will be premedicated accordingly.   2. HTN - she says her blood pressure has gotten better at home.   3. Multiple allergies  Her cath is arranged for this Thursday. Will check labs today. Patient is agreeable to this plan and will call if any problems develop in the interim.

## 2012-07-26 NOTE — Patient Instructions (Addendum)
We need to arrange for a heart catheterization  We will need to check labs today.  You may stop the Bystolic and use the Metoprolol as already ordered.  Stay on your other medicines  You are scheduled for a cardiac catheterization on Thursday, November 21st  with Dr. Angelena Form or associate.  Go to [facility] 2nd Floor Short Stay on Thursday, November 21st at 7am. No food or drink after midnight on Wednesday. You may take your medications with a sip of water on the day of your procedure.    Take Prednisone 60 mg on Wednesday night and again on Thursday morning before your procedure. This is because of your dye allergy.

## 2012-07-26 NOTE — Telephone Encounter (Signed)
Pt calling for stress test results

## 2012-07-26 NOTE — Telephone Encounter (Signed)
Results given, app made for today so lhc can be scheduled.

## 2012-07-28 ENCOUNTER — Encounter (HOSPITAL_COMMUNITY): Payer: Self-pay | Admitting: General Practice

## 2012-07-28 ENCOUNTER — Ambulatory Visit (HOSPITAL_COMMUNITY)
Admission: RE | Admit: 2012-07-28 | Discharge: 2012-07-29 | Disposition: A | Discharge status: Home / Self Care | Payer: Medicare Other | Source: Ambulatory Visit | Attending: Cardiovascular Disease | Admitting: Cardiovascular Disease

## 2012-07-28 ENCOUNTER — Encounter (HOSPITAL_COMMUNITY)
Admission: RE | Disposition: A | Discharge status: Home / Self Care | Payer: Self-pay | Source: Ambulatory Visit | Attending: Cardiovascular Disease

## 2012-07-28 DIAGNOSIS — K7689 Other specified diseases of liver: Secondary | ICD-10-CM | POA: Insufficient documentation

## 2012-07-28 DIAGNOSIS — E785 Hyperlipidemia, unspecified: Secondary | ICD-10-CM

## 2012-07-28 DIAGNOSIS — K5710 Diverticulosis of small intestine without perforation or abscess without bleeding: Secondary | ICD-10-CM | POA: Insufficient documentation

## 2012-07-28 DIAGNOSIS — I25810 Atherosclerosis of coronary artery bypass graft(s) without angina pectoris: Secondary | ICD-10-CM

## 2012-07-28 DIAGNOSIS — R079 Chest pain, unspecified: Secondary | ICD-10-CM

## 2012-07-28 DIAGNOSIS — Z91041 Radiographic dye allergy status: Secondary | ICD-10-CM | POA: Insufficient documentation

## 2012-07-28 DIAGNOSIS — I10 Essential (primary) hypertension: Secondary | ICD-10-CM

## 2012-07-28 DIAGNOSIS — K219 Gastro-esophageal reflux disease without esophagitis: Secondary | ICD-10-CM | POA: Insufficient documentation

## 2012-07-28 DIAGNOSIS — I200 Unstable angina: Secondary | ICD-10-CM | POA: Insufficient documentation

## 2012-07-28 DIAGNOSIS — R159 Full incontinence of feces: Secondary | ICD-10-CM | POA: Insufficient documentation

## 2012-07-28 DIAGNOSIS — I2510 Atherosclerotic heart disease of native coronary artery without angina pectoris: Secondary | ICD-10-CM | POA: Insufficient documentation

## 2012-07-28 DIAGNOSIS — E669 Obesity, unspecified: Secondary | ICD-10-CM

## 2012-07-28 DIAGNOSIS — I119 Hypertensive heart disease without heart failure: Secondary | ICD-10-CM | POA: Diagnosis present

## 2012-07-28 DIAGNOSIS — E119 Type 2 diabetes mellitus without complications: Secondary | ICD-10-CM | POA: Insufficient documentation

## 2012-07-28 DIAGNOSIS — F411 Generalized anxiety disorder: Secondary | ICD-10-CM | POA: Insufficient documentation

## 2012-07-28 HISTORY — DX: Migraine, unspecified, not intractable, without status migrainosus: G43.909

## 2012-07-28 HISTORY — PX: LEFT HEART CATHETERIZATION WITH CORONARY/GRAFT ANGIOGRAM: SHX5450

## 2012-07-28 HISTORY — DX: Other complications of anesthesia, initial encounter: T88.59XA

## 2012-07-28 HISTORY — DX: Type 2 diabetes mellitus without complications: E11.9

## 2012-07-28 HISTORY — DX: Adverse effect of unspecified anesthetic, initial encounter: T41.45XA

## 2012-07-28 HISTORY — PX: PERCUTANEOUS CORONARY STENT INTERVENTION (PCI-S): SHX5485

## 2012-07-28 HISTORY — PX: CORONARY ANGIOPLASTY WITH STENT PLACEMENT: SHX49

## 2012-07-28 LAB — GLUCOSE, CAPILLARY
Glucose-Capillary: 161 mg/dL — ABNORMAL HIGH (ref 70–99)
Glucose-Capillary: 180 mg/dL — ABNORMAL HIGH (ref 70–99)

## 2012-07-28 SURGERY — LEFT HEART CATHETERIZATION WITH CORONARY/GRAFT ANGIOGRAM
Anesthesia: LOCAL

## 2012-07-28 MED ORDER — ASPIRIN 81 MG PO CHEW
81.0000 mg | CHEWABLE_TABLET | Freq: Every day | ORAL | Status: DC
  Administered 2012-07-29: 09:00:00 81 mg via ORAL
  Filled 2012-07-28: qty 1

## 2012-07-28 MED ORDER — DIPHENHYDRAMINE HCL 50 MG/ML IJ SOLN
INTRAMUSCULAR | Status: AC
  Filled 2012-07-28: qty 1

## 2012-07-28 MED ORDER — HEPARIN (PORCINE) IN NACL 2-0.9 UNIT/ML-% IJ SOLN
INTRAMUSCULAR | Status: AC
  Filled 2012-07-28: qty 1000

## 2012-07-28 MED ORDER — SODIUM CHLORIDE 0.9 % IV SOLN
INTRAVENOUS | Status: DC
  Administered 2012-07-28: 1000 mL via INTRAVENOUS

## 2012-07-28 MED ORDER — ASPIRIN 81 MG PO CHEW
CHEWABLE_TABLET | ORAL | Status: AC
  Administered 2012-07-28: 324 mg via ORAL
  Filled 2012-07-28: qty 4

## 2012-07-28 MED ORDER — HYDRALAZINE HCL 20 MG/ML IJ SOLN
INTRAMUSCULAR | Status: AC
  Filled 2012-07-28: qty 1

## 2012-07-28 MED ORDER — METOPROLOL TARTRATE 50 MG PO TABS
50.0000 mg | ORAL_TABLET | Freq: Two times a day (BID) | ORAL | Status: DC
  Administered 2012-07-29: 06:00:00 50 mg via ORAL
  Filled 2012-07-28 (×3): qty 1

## 2012-07-28 MED ORDER — SODIUM CHLORIDE 0.9 % IJ SOLN: 3.0000 mL | Freq: Two times a day (BID) | INTRAMUSCULAR | Status: DC

## 2012-07-28 MED ORDER — MIDAZOLAM HCL 2 MG/2ML IJ SOLN
INTRAMUSCULAR | Status: AC
  Filled 2012-07-28: qty 2

## 2012-07-28 MED ORDER — SODIUM CHLORIDE 0.9 % IV SOLN: INTRAVENOUS | Status: AC

## 2012-07-28 MED ORDER — PANTOPRAZOLE SODIUM 20 MG PO TBEC
20.0000 mg | DELAYED_RELEASE_TABLET | Freq: Every day | ORAL | Status: DC
  Administered 2012-07-28 – 2012-07-29 (×2): 20 mg via ORAL
  Filled 2012-07-28 (×3): qty 1

## 2012-07-28 MED ORDER — ACETAMINOPHEN 325 MG PO TABS: 650.0000 mg | ORAL_TABLET | ORAL | Status: DC | PRN

## 2012-07-28 MED ORDER — FENTANYL CITRATE 0.05 MG/ML IJ SOLN
INTRAMUSCULAR | Status: AC
  Filled 2012-07-28: qty 2

## 2012-07-28 MED ORDER — SODIUM CHLORIDE 0.9 % IV SOLN: 250.0000 mL | INTRAVENOUS | Status: DC | PRN

## 2012-07-28 MED ORDER — SODIUM CHLORIDE 0.9 % IJ SOLN: 3.0000 mL | INTRAMUSCULAR | Status: DC | PRN

## 2012-07-28 MED ORDER — ONDANSETRON HCL 4 MG/2ML IJ SOLN: 4.0000 mg | Freq: Four times a day (QID) | INTRAMUSCULAR | Status: DC | PRN

## 2012-07-28 MED ORDER — PRASUGREL HCL 10 MG PO TABS
ORAL_TABLET | ORAL | Status: AC
  Filled 2012-07-28: qty 6

## 2012-07-28 MED ORDER — PRASUGREL HCL 10 MG PO TABS
10.0000 mg | ORAL_TABLET | Freq: Every day | ORAL | Status: DC
  Administered 2012-07-29: 09:00:00 10 mg via ORAL
  Filled 2012-07-28: qty 1

## 2012-07-28 MED ORDER — FAMOTIDINE IN NACL 20-0.9 MG/50ML-% IV SOLN
20.0000 mg | INTRAVENOUS | Status: AC
  Administered 2012-07-28: 20 mg via INTRAVENOUS

## 2012-07-28 MED ORDER — DIPHENHYDRAMINE HCL 50 MG/ML IJ SOLN: 25.0000 mg | INTRAMUSCULAR | Status: DC

## 2012-07-28 MED ORDER — NITROGLYCERIN 0.2 MG/ML ON CALL CATH LAB
INTRAVENOUS | Status: AC
  Filled 2012-07-28: qty 1

## 2012-07-28 MED ORDER — BIVALIRUDIN 250 MG IV SOLR
INTRAVENOUS | Status: AC
  Filled 2012-07-28: qty 250

## 2012-07-28 MED ORDER — ASPIRIN 81 MG PO CHEW
324.0000 mg | CHEWABLE_TABLET | ORAL | Status: AC
  Administered 2012-07-28: 324 mg via ORAL

## 2012-07-28 MED ORDER — AMLODIPINE BESYLATE 2.5 MG PO TABS
2.5000 mg | ORAL_TABLET | Freq: Every day | ORAL | Status: DC
  Administered 2012-07-28 – 2012-07-29 (×2): 2.5 mg via ORAL
  Filled 2012-07-28 (×2): qty 1

## 2012-07-28 MED ORDER — LIDOCAINE HCL (PF) 1 % IJ SOLN
INTRAMUSCULAR | Status: AC
  Filled 2012-07-28: qty 30

## 2012-07-28 NOTE — CV Procedure (Signed)
Cardiac Catheterization Operative Report  Ruth [name] [id number] 11/21/201312:03 PM Jerlyn Ly, MD  Procedure Performed:  1. Left Heart Catheterization 2. Selective Coronary Angiography 3. SVG angiography 4. LIMA graft angiography 5. Left ventricular angiogram 6. PTCA/DES x 1 mid body of SVG to PDA 7. Angioseal femoral artery closure device RFA  Operator: Lauree Chandler, MD  Indication:   66 yo WF with history of DM, HTN, HLD, CAD s/p CABG with recent chest/abdominal pain concerning for unstable angina with lateral ischemia in stress myoview.                                 Procedure Details: The risks, benefits, complications, treatment options, and expected outcomes were discussed with the patient. The patient and/or family concurred with the proposed plan, giving informed consent. The patient was brought to the cath lab after IV hydration was begun and oral premedication was given. The patient was further sedated with Versed and Fentanyl. The right groin was prepped and draped in the usual manner. Using the modified Seldinger access technique, a 5 French sheath was placed in the right femoral artery. Standard diagnostic catheters were used to perform selective coronary angiography. The JR4 catheter was used to engage both vein grafts. A IMA catheter was used to engage the LIMA graft.  A pigtail catheter was used to perform a left ventricular angiogram.  The patient was found to have a severe stenosis of the mid body of the SVG to the PDA. The sheath was upsized to a 6 [place] system. She was given a bolus of Angiomax and a drip was started. She was given 60 mg po Effient x 1 (she reported prior allergic response to Plavix). I then engaged the SVG to the PDA with a RCB guiding catheter. When the ACT was greater than 200, I passed a BMW wire down the vein graft into the distal target vessel. I then placed a Spider 5.0 filter wire in the distal body of the vein graft. A 3.5 x  12 mm Promus Element DES was carefully positioned in the mid body of the vein graft in the area of most severe stenosis and was deployed at 12 atm. I then retrieved the filter. There was an excellent result. The stenosis was taken from 99% down to 0%. There was excellent flow into the distal vessel. An Angioseal femoral artery closure device was deployed in the RFA but this failed and there was steady oozing from the site.   There were no immediate complications. The patient was taken to the recovery area in stable condition.   Hemodynamic Findings: Central aortic pressure: 183/86 Left ventricular pressure: 189/11/18  Angiographic Findings:  Left main: 10% distal stenosis.   Left Anterior Descending Artery: Large caliber vessel that courses to the apex. The proximal vessel has a 60% stenosis. The mid vessel has a 100% sub-total occlusion. The first two diagonal branches arise before the total occlusion and have non-obstructive plaque disease. The third diagonal branch, mid LAD and distal LAD fills from the LIMA graft.   Circumflex Artery: Moderate caliber vessel 100% mid occlusion. The second OM and distal AV groove Circumflex fill from the vein graft.   Right Coronary Artery: Large caliber, dominant vessel with 30% proximal stenosis, diffuse 50% mid vessel stenosis and 99% sub-total occlusion of the distal vessel just before the bifurcation into the PDA and PLA. The PDA and PLA fill from the patent vein  graft.   Graft Anatomy:  SVG to PDA/PLA patent with 99% stenosis in the mid body of the vein graft SVG to OM is patent LIMA to mid LAD and diagonal is patent  Left Ventricular Angiogram: LVEF=65%  Impression: 1. Triple vessel CAD s/p 4V CABG with 4/4 patent grafts with severe stenosis mid body of SVG to PDA/PLA. 2. Unstable angina 3. Successful PTCA/DES x 1 mid body of SVG to PDA/PLA 4. Preserved LV systolic function  Recommendations: She will need ASA and Effient for one year.  Continue other cardiac meds.        Complications:  None. The patient tolerated the procedure well.

## 2012-07-28 NOTE — H&P (View-Only) (Signed)
Ruth [name] Date of Birth: [birth year] Medical Record #[id number]  History of Present Illness: Ruth [name] is seen back today for a pre cath visit. She is seen for Dr. Acie Fredrickson. She has known CAD with remote CABG x 4 in 2006 in [place], [place]. Her other issues include HTN, HLD and GERD. Blood pressure has been running higher recently. Has had her medicines adjusted. She has a significant degree of anxiety.   She comes in today. She has had a stress testing due to recurrent upper abdominal pain. This is abnormal. She is quite adamant that her pain is from the [place]. This has been switched to Metoprolol earlier today. She was going to start tomorrow. She is quite anxious and under lots of stress. Trying to take care of her aging parents. Husband died last year. Blood pressure is in the 150's now at home and tends to run higher here and is higher when she gets anxious.   Current Outpatient Prescriptions on File Prior to Visit  Medication Sig Dispense Refill  . amLODipine (NORVASC) 5 MG tablet Take 0.5 tablets (2.5 mg total) by mouth daily.  30 tablet  11  . aspirin 500 MG tablet Take 500 mg by mouth. Taking Twice a Week      . bifidobacterium infantis (ALIGN) capsule Take 1 capsule by mouth daily.  14 capsule  0  . calcium carbonate (TUMS - DOSED IN MG ELEMENTAL CALCIUM) 500 MG chewable tablet Chew 1 tablet by mouth 2 (two) times daily as needed. For heartburn      . lansoprazole (PREVACID) 30 MG capsule Take 1 capsule (30 mg total) by mouth daily.  90 capsule  3  . metoprolol (LOPRESSOR) 50 MG tablet Take 50 mg by mouth 2 (two) times daily.      . metoCLOPramide (REGLAN) 5 MG tablet Take one po TID AC  60 tablet  1    Allergies  Allergen Reactions  . Codeine Anaphylaxis  . Shrimp Flavor Shortness Of Breath  . Demerol     Unknown  . Iohexol     Unknown  . Latex   . Plavix (Clopidogrel Bisulfate)     Unknown  . Red Dye     Unknown  . Sulfonamide Derivatives     Past Medical History    Diagnosis Date  . CAD (coronary artery disease)     CABG x 4 in 2006 in [place]  . Labile hypertension   . Dyslipidemia   . Diabetes mellitus   . Steatohepatitis   . Diverticulosis   . IBS (irritable bowel syndrome)   . Obesity   . Hyperplastic colon polyp   . Fecal incontinence   . Multiple allergies   . Anxiety   . Obesity     Past Surgical History  Procedure Date  . Coronary artery bypass graft 2006  . Inguinal hernia repair   . Partial hysterectomy   . Hemorrhoid surgery   . Breast lumpectomy     bilateral  . Head lumpectomy     History  Smoking status  . Never Smoker   Smokeless tobacco  . Never Used    History  Alcohol Use No    Family History  Problem Relation Age of Onset  . Coronary artery disease Father   . Colon cancer Father   . Colon polyps Father   . Heart disease Father   . Hypertension    . Breast cancer      grandmother  . Diabetes Maternal  Grandmother   . Diabetes Paternal Grandmother   . Stroke Mother     Review of Systems: The review of systems is per the HPI.  All other systems were reviewed and are negative.  Physical Exam: BP 180/90  Pulse 80  Resp 20  Ht 5' 3.5" (1.613 m)  Wt 192 lb (87.091 kg)  BMI 33.48 kg/m2 Patient is quite anxious and in no acute distress. She is tearful and crying during today's visit. Skin is warm and dry. Color is normal.  HEENT is unremarkable. Normocephalic/atraumatic. PERRL. Sclera are nonicteric. Neck is supple. No masses. No JVD. Lungs are clear. Cardiac exam shows a regular rate and rhythm. Abdomen is soft. Extremities are without edema. Gait and ROM are intact. No gross neurologic deficits noted.  LABORATORY DATA: Pending  Myoview Impression  Exercise Capacity: Lexiscan with no exercise.  BP Response: Normal blood pressure response.  Clinical Symptoms: No significant symptoms noted.  ECG Impression: No significant ST segment change suggestive of ischemia.  Comparison with Prior  Nuclear Study: No images to compare   Overall Impression: Abnormal stress nuclear study. She has had episodes of angina. She has a large area of moderate ischemia in the lateral wall.  LV Ejection Fraction: 71%. LV Wall Motion: NL LV Function; NL Wall Motion.  Thayer Headings, Ruth [name]., MD, [facility]  07/21/2012, 6:03 PM  Office - [phone]  Pager [phone]   Assessment / Plan: 1. Chest pain/abdominal pain with abnormal Myoview - known CAD with prior CABG. Will proceed on with cardiac catheterization. Procedure is reviewed in detail and she is willing to proceed. She has multiple allergies including Xray dye and will be premedicated accordingly.   2. HTN - she says her blood pressure has gotten better at home.   3. Multiple allergies  Her cath is arranged for this Thursday. Will check labs today. Patient is agreeable to this plan and will call if any problems develop in the interim.

## 2012-07-28 NOTE — Interval H&P Note (Signed)
History and Physical Interval Note:  07/28/2012 10:49 AM  Ruth [name]  has presented today for cardiac cath with the diagnosis of Chest pain and abnormal myvoview.   The various methods of treatment have been discussed with the patient and family. After consideration of risks, benefits and other options for treatment, the patient has consented to  Procedure(s) (LRB) with comments: LEFT HEART CATHETERIZATION WITH CORONARY/GRAFT ANGIOGRAM (N/A) as a surgical intervention .  The patient's history has been reviewed, patient examined, no change in status, stable for surgery.  I have reviewed the patient's chart and labs.  Questions were answered to the patient's satisfaction.     Wallace Gappa

## 2012-07-29 ENCOUNTER — Encounter (HOSPITAL_COMMUNITY): Payer: Self-pay | Admitting: Physician Assistant

## 2012-07-29 DIAGNOSIS — E669 Obesity, unspecified: Secondary | ICD-10-CM

## 2012-07-29 DIAGNOSIS — I200 Unstable angina: Secondary | ICD-10-CM | POA: Diagnosis present

## 2012-07-29 DIAGNOSIS — I2510 Atherosclerotic heart disease of native coronary artery without angina pectoris: Secondary | ICD-10-CM

## 2012-07-29 LAB — BASIC METABOLIC PANEL
Chloride: 104 mEq/L (ref 96–112)
GFR calc Af Amer: >90 mL/min (ref >90)
GFR calc non Af Amer: >90 mL/min (ref >90)
Glucose, Bld: 129 mg/dL — ABNORMAL HIGH (ref 70–99)
Potassium: 3.5 mEq/L (ref 3.5–5.1)
Sodium: 142 mEq/L (ref 135–145)

## 2012-07-29 LAB — CBC
Hemoglobin: 14 g/dL (ref 12.0–15.0)
MCHC: 34 g/dL (ref 30.0–36.0)
RDW: 12.9 % (ref 11.5–15.5)
WBC: 9 10*3/uL (ref 4.0–10.5)

## 2012-07-29 MED ORDER — NITROGLYCERIN 0.4 MG SL SUBL: 0.4000 mg | SUBLINGUAL_TABLET | SUBLINGUAL | Status: DC | PRN

## 2012-07-29 MED ORDER — ASPIRIN 81 MG PO TABS: 81.0000 mg | ORAL_TABLET | Freq: Every day | ORAL | Status: DC

## 2012-07-29 MED ORDER — PRASUGREL HCL 10 MG PO TABS: 10.0000 mg | ORAL_TABLET | Freq: Every day | ORAL | Status: DC

## 2012-07-29 MED FILL — Dextrose Inj 5%: INTRAVENOUS | Qty: 50 | Status: AC

## 2012-07-29 NOTE — Discharge Summary (Signed)
See full note this am. cdm

## 2012-07-29 NOTE — Discharge Summary (Signed)
Discharge Summary   Patient ID: Ruth [name] MRN: [id number], DOB/AGE: 1946-08-18 66 y.o. Admit date: 07/28/2012 D/C date:     07/29/2012  Primary Cardiologist: Nahser  Primary Discharge Diagnoses:  1. CAD with unstable angina this admission  - s/p PTCA/DES to mid body of SVG to PDA/PLA 07/28/12 - s/p CABG x 4 in 2006 in [place], [place] 2. Obesity BMI 34.9 3. HTN 4. Dyslipidemia, intolerant of statins  Secondary Discharge Diagnoses:  1. GERD 2. Anxiety 3. Diverticulosis 4. Hyperplastic colon polyp 5. Fecal incontinence 6. Multiple allergies including contrast dye 7. Diabetes mellitus 8. Steatohepatitis 9. Remote history of migraines  Hospital Course: Ruth [name] is a 66 y/o F with history of CAD s/p CABG 2006 in [place], HTN, HL, GERD. She recently underwent stress testing due to recurrent upper abdominal pain. This was abnormal showing large area of moderate ischemia in the lateral wall, EF was 71%. She was pre-treated with prednisone in prep for cath. She underwent cath yesterday demonstrating 4/4 patent grafts with severe stenosis mid body of SVG to PDA/PLA.This was treated with successful PTCA/DES. She tolerated the procedure well. Dr. Angelena Form recommended Effient/ASA for 1 year. He has seen and examined the patient today and feels she is stable for discharge. She received two rx's for Effient - one regular prescription and an additional one for use with the 30-day free card.    Discharge Vitals: Blood pressure 153/73, pulse 79, temperature 98.5 F (36.9 C), temperature source Oral, resp. rate 16, height 5' 3.5" (1.613 m), weight 200 lb (90.719 kg), SpO2 96.00%.  Labs: Lab Results  Component Value Date   WBC 9.0 07/29/2012   HGB 14.0 07/29/2012   HCT 41.2 07/29/2012   MCV 89.8 07/29/2012   PLT 230 07/29/2012    Lab 07/29/12 0515  NA 142  K 3.5  CL 104  CO2 25  BUN 17  CREATININE 0.56  CALCIUM 9.3  PROT --  BILITOT --  ALKPHOS --  ALT --  AST --  GLUCOSE 129*     Lab Results  Component Value Date   CHOL 241* 06/21/2012   HDL 30.20* 06/21/2012   TRIG 362.0* 06/21/2012    Diagnostic Studies/Procedures   US Abdomen Complete 07/07/2012  *RADIOLOGY REPORT*  Clinical Data:  Abdominal pain.  Epigastric pain.  COMPLETE ABDOMINAL ULTRASOUND  Comparison:  CT of the abdomen and pelvis with contrast 06/19/1008.  Findings:  Gallbladder:  A 4.8 mm polyp is present along the posterior wall of the gallbladder.  No stones are present.  Wall thickness is within normal limits at 1.7 mm.  There is no sonographic Murphy's sign.  Common bile duct:  The colon bile duct is mildly dilated, measuring 7.4 mm.  Liver:  The liver is mildly enlarged.  Increased echogenicity is somewhat heterogeneous.  A 10 mm cyst is benign.  IVC:  Appears normal.  Pancreas:  Although the pancreas is difficult to visualize in its entirety, no focal pancreatic abnormality is identified.  Spleen:  Normal size and echotexture without focal parenchymal abnormality.  The maximal length is 11.1 cm.  Right Kidney:  No hydronephrosis.  Well-preserved cortex.  Normal size and parenchymal echotexture without focal abnormalities. The maximal length is 13.3 cm, within normal limits.  Left Kidney:  No hydronephrosis.  Well-preserved cortex.  Normal size and parenchymal echotexture without focal abnormalities. The maximal length is 13.0 cm, within normal limits.  Abdominal aorta:  No aneurysm identified.  IMPRESSION:  1.  Heterogeneous appearance of the  liver.  There is likely some degree of fatty infiltration.  No discrete lesions are evident. 2.  Small gallbladder polyp without evidence for stones or cholecystitis. 3.  Borderline enlargement of the common bile duct.  No obstructing lesion is evident. 4.  No other acute or focal lesion to explain the patient's symptoms.   Original Report Authenticated By: San Morelle, M.D.    Dg Chest Port 1 View 07/07/2012  *RADIOLOGY REPORT*  Clinical Data: Chest pain and  shortness of breath.  PORTABLE CHEST - 1 VIEW  Comparison: Chest 07/26/2010.  Findings: There is cardiomegaly without edema.  Mild atelectasis in the lung bases is noted.  No pneumothorax or pleural fluid.  IMPRESSION: Cardiomegaly without acute disease.   Original Report Authenticated By: Orlean Patten, M.D.    Darletta Moll Duanne Limerick Charmayne Sheer 07/11/2012  *RADIOLOGY REPORT*  Clinical Data: Epigastric pain, bloating  UPPER GI SERIES W/ KUB  Technique: Double contrast upper GI study  Fluoroscopy time was 1.47 minutes  Comparison:  None.  Findings: The patient is status post median sternotomy and CABG.  The esophagus shows normal contour, distensibility and peristalsis.  No esophageal stricture.  No gastroesophageal reflux was noted.  No upper esophageal web .  No hiatal hernia.  The stomach shows normal contour, distensibility and peristalsis. No obstructing or constricting mass.  No gastric outlet obstruction.  The duodenal bulb and duodenal sweep are unremarkable. Visualized proximal small bowel is unremarkable.  IMPRESSION: Unremarkable double contrast upper GI study.   Original Report Authenticated By: Lahoma Crocker, M.D.     Discharge Medications   Current Discharge Medication List    START taking these medications   Details  nitroGLYCERIN (NITROSTAT) 0.4 MG SL tablet Place 1 tablet (0.4 mg total) under the tongue every 5 (five) minutes as needed for chest pain (up to 3 doses). Qty: 25 tablet, Refills: 4    prasugrel (EFFIENT) 10 MG TABS Take 1 tablet (10 mg total) by mouth daily. Qty: 30 tablet, Refills: 10      CONTINUE these medications which have CHANGED   Details  aspirin 81 MG tablet Take 1 tablet (81 mg total) by mouth daily.      CONTINUE these medications which have NOT CHANGED   Details  amLODipine (NORVASC) 5 MG tablet Take 0.5 tablets (2.5 mg total) by mouth daily. Qty: 30 tablet, Refills: 11   Associated Diagnoses: Hypertension; Hyperlipidemia    calcium carbonate (TUMS - DOSED IN MG  ELEMENTAL CALCIUM) 500 MG chewable tablet Chew 1 tablet by mouth 2 (two) times daily as needed. For heartburn    lansoprazole (PREVACID) 30 MG capsule Take 1 capsule (30 mg total) by mouth daily. Qty: 90 capsule, Refills: 3    metoprolol (LOPRESSOR) 50 MG tablet Take 50 mg by mouth 2 (two) times daily.      STOP taking these medications     predniSONE (STERAPRED UNI-PAK) 10 MG tablet Comments:  Reason for Stopping:          Disposition   The patient will be discharged in stable condition to home. Discharge Orders    Future Orders Please Complete By Expires   Diet - low sodium heart healthy      Increase activity slowly      Comments:   No driving for 2 days. No lifting over 5 lbs for 1 week. No sexual activity for 1 week. Keep procedure site clean & dry. If you notice increased pain, swelling, bleeding or pus, call/return!  You may shower, but  no soaking baths/hot tubs/pools for 1 week.     Follow-up Information    Follow up with Darden Amber., MD. (Our office will call you for a follow-up appointment)    Contact information:   [address] [place] [address] [phone]       Follow up with [name], MD. (Follow up with your gastroenterologist for your abdominal pain)    Contact information:   [address] [place] [address] [phone]            Duration of Discharge Encounter: Greater than 30 minutes including physician and PA time.  Signed, Melina Copa PA-C 07/29/2012, 7:58 AM

## 2012-07-29 NOTE — Progress Notes (Signed)
CARDIAC REHAB PHASE I   PRE:  Rate/Rhythm: 66 SR    BP: sitting 159/82    SaO2:   MODE:  Ambulation: 500 ft   POST:  Rate/Rhythm: 77    BP: sitting 156/73     SaO2:   Tolerated well. C/o abdominal pain that did make her slightly sob briefly. sts no angina walking, sob much improved. Ed completed. Not interested in CRPII due to bowel issues but will ex at home.  [id number]  Darrick Meigs CES, ACSM

## 2012-07-29 NOTE — Progress Notes (Signed)
    SUBJECTIVE: Feels great. No chest pain or SOB. NO events.   BP 153/73  Pulse 79  Temp 98.5 F (36.9 C) (Oral)  Resp 16  Ht 5' 3.5" (1.613 m)  Wt 200 lb (90.719 kg)  BMI 34.87 kg/m2  SpO2 96%  Intake/Output Summary (Last 24 hours) at 07/29/12 0647 Last data filed at 07/29/12 0500  Gross per 24 hour  Intake    120 ml  Output   1101 ml  Net   -981 ml    PHYSICAL EXAM General: Well developed, well nourished, in no acute distress. Alert and oriented x 3.  Psych:  Good affect, responds appropriately Neck: No JVD. No masses noted.  Lungs: Clear bilaterally with no wheezes or rhonci noted.  Heart: RRR with no murmurs noted. Abdomen: Bowel sounds are present. Soft, non-tender.  Extremities: No lower extremity edema. No right groin hematoma.   LABS: Basic Metabolic Panel:  Basename 07/26/12 1555  NA 138  K 3.9  CL 100  CO2 29  GLUCOSE 109*  BUN 18  CREATININE 0.8  CALCIUM 9.7  MG --  PHOS --   CBC:  Basename 07/29/12 0515 07/26/12 1555  WBC 9.0 7.7  NEUTROABS -- 4.9  HGB 14.0 14.8  HCT 41.2 43.8  MCV 89.8 90.6  PLT 230 249.0    Current Meds:    . amLODipine  2.5 mg Oral Daily  . [COMPLETED] aspirin  324 mg Oral Pre-Cath  . aspirin  81 mg Oral Daily  . [COMPLETED] bivalirudin      . [EXPIRED] diphenhydrAMINE      . [COMPLETED] diphenhydrAMINE      . [COMPLETED] famotidine (PEPCID) IV  20 mg Intravenous On Call  . [COMPLETED] fentaNYL      . [COMPLETED] heparin      . [COMPLETED] hydrALAZINE      . [COMPLETED] lidocaine      . metoprolol  50 mg Oral BID  . [COMPLETED] midazolam      . [COMPLETED] nitroGLYCERIN      . pantoprazole  20 mg Oral Daily  . [COMPLETED] prasugrel      . prasugrel  10 mg Oral Daily  . [DISCONTINUED] diphenhydrAMINE  25 mg Intravenous On Call  . [DISCONTINUED] sodium chloride  3 mL Intravenous Q12H     ASSESSMENT AND PLAN:  1. Unstable angina/CAD: Admitted after cardiac cath yesterday. Recent worsening of chest  pain/epigastric pain. History of 4V CABG. Stress myoview as outpatient with large lateral ischemia. Pt found to have 99% stenosis in the vein graft to the large posterolateral branch and PDA of RCA. Now s/p DES x 1 mid body of SVG to PDA/PLA. Doing well. Will need ASA 81 mg po Qdaily and Effient 10 mg po Qdaily for one year. Continue beta blocker. She does not tolerate statins.   2. Dispo: Ambulate this am with cardiac rehab. D/C home if BMET ok. Follow up with Liam Rogers in 2-3 weeks.   Lamoyne Palencia  11/22/20136:47 AM

## 2012-08-03 ENCOUNTER — Ambulatory Visit: Payer: Medicare Other | Admitting: Internal Medicine

## 2012-08-10 ENCOUNTER — Telehealth: Payer: Self-pay | Admitting: Cardiovascular Disease

## 2012-08-10 NOTE — Telephone Encounter (Signed)
[phone] pt having stomach pain, sob, some chest pain, since before stents and still having them but now not as bad , has appt with nahser 12-12 and wants to know if needs to be seen sooner, will only see him , she wants you to know

## 2012-08-10 NOTE — Telephone Encounter (Signed)
F/u   Pt calling for f/u SOB.  Pt says she needs to come in to see Dr. Acie Fredrickson, She hates the ER and will not go if SOB progresses.

## 2012-08-10 NOTE — Telephone Encounter (Signed)
App made for tomorrow

## 2012-08-10 NOTE — Telephone Encounter (Signed)
lmtcb at home number

## 2012-08-11 ENCOUNTER — Ambulatory Visit (INDEPENDENT_AMBULATORY_CARE_PROVIDER_SITE_OTHER): Payer: Medicare Other | Admitting: Cardiovascular Disease

## 2012-08-11 ENCOUNTER — Encounter: Payer: Self-pay | Admitting: Cardiovascular Disease

## 2012-08-11 VITALS — BP 178/96 | HR 74 | Ht 63.5 in | Wt 192.4 lb

## 2012-08-11 DIAGNOSIS — E669 Obesity, unspecified: Secondary | ICD-10-CM

## 2012-08-11 DIAGNOSIS — I2510 Atherosclerotic heart disease of native coronary artery without angina pectoris: Secondary | ICD-10-CM

## 2012-08-11 DIAGNOSIS — E119 Type 2 diabetes mellitus without complications: Secondary | ICD-10-CM

## 2012-08-11 DIAGNOSIS — I10 Essential (primary) hypertension: Secondary | ICD-10-CM

## 2012-08-11 MED ORDER — PRASUGREL HCL 10 MG PO TABS: 10.0000 mg | ORAL_TABLET | Freq: Every day | ORAL | Status: DC

## 2012-08-11 NOTE — Assessment & Plan Note (Signed)
Her BP remains elevated today - it is frequently elevated when she is stressed out.  It was better controlled on Bystolic but she does not want to pay for it.  She has multiple drug intolerances and it has been very difficult to find medications that she can tolerate / afford.  I have encouraged her to work on a good diet and exercise program to help with her BP control.

## 2012-08-11 NOTE — Progress Notes (Signed)
Ruth [name] Date of Birth  Jul 30, 1946 [place] [address]    [place]   [name], [place]  [address]    [place], [place]  [address] [phone]  Fax  [phone]  [phone]  Fax [phone]  Problem list: 1. Coronary artery disease-status post CABG- 2006, s/p stent placement to SVT to PDA/PLSA (3.5 x 12 mm Promus Element DES ) 2. Hypertension 3. Hypercholesterolemia   History of Present Illness:  Ruth [name] has done fairly well since I last saw her. [name] been under lots of stress. Her husband died this past Christmas. [name] also had patent help her mother who is not in good health right now.  She recently presented with unstable angina and a myoview revealed lateral ischemia.  She was found to have a tight stenosis and she had a stent placed in the SVG to PDA / PLSA. [name] not had any recurrent chest pain since the stent to the saphenous vein graft. She has had some stomach issues. She denies any blood in her stool. She takes antacids and PPIs. She does not have any real shortness breath but does feel the need to take a deep breath on occasion. She has not been exercising.  She has gained some weight.    Her BP has been a little elevated.  It was better controlled on Bystolic but she is back on Metoprolol due to costs.   Current Outpatient Prescriptions on File Prior to Visit  Medication Sig Dispense Refill  . amLODipine (NORVASC) 5 MG tablet Take 0.5 tablets (2.5 mg total) by mouth daily.  30 tablet  11  . aspirin 81 MG tablet Take 1 tablet (81 mg total) by mouth daily.      . calcium carbonate (TUMS - DOSED IN MG ELEMENTAL CALCIUM) 500 MG chewable tablet Chew 1 tablet by mouth 2 (two) times daily as needed. For heartburn      . lansoprazole (PREVACID) 30 MG capsule Take 1 capsule (30 mg total) by mouth daily.  90 capsule  3  . metoprolol (LOPRESSOR) 50 MG tablet Take 50 mg by mouth 2 (two) times daily.      .  nitroGLYCERIN (NITROSTAT) 0.4 MG SL tablet Place 1 tablet (0.4 mg total) under the tongue every 5 (five) minutes as needed for chest pain (up to 3 doses).  25 tablet  4  . prasugrel (EFFIENT) 10 MG TABS Take 1 tablet (10 mg total) by mouth daily.  30 tablet  10    Allergies  Allergen Reactions  . Betadine (Povidone Iodine) Swelling  . Codeine Anaphylaxis and Other (See Comments)    "quit breathing" (07/28/2012)  . Demerol Other (See Comments)    "quit breathing" (07/28/2012)  . Latex Other (See Comments)    "quit breathing" (07/28/2012)  . Other Other (See Comments)    Perfume, Any Fragrance. "quit breathing" (07/28/2012)  . Percocet (Oxycodone-Acetaminophen) Other (See Comments)    "quit breathing; disoriented" (07/28/2012)  . Plavix (Clopidogrel Bisulfate) Other (See Comments)    "get hot; like I'm burning up inside; had to put me in shower after OHS because of that" (07/28/2012)  . Red Dye Other (See Comments)    "quit breathing" (07/28/2012)  . Shrimp (Shellfish Allergy) Shortness Of Breath and Other (See Comments)    "broke out in knots all over" (07/28/2012)  . Shrimp Flavor Shortness Of Breath and Other (See Comments)    "broke out in  knots all over" (07/28/2012)  . Sulfonamide Derivatives Rash and Other (See Comments)    "quit breathing" (07/28/2012)  . Tylenol (Acetaminophen) Shortness Of Breath  . Iohexol     Unknown    Past Medical History  Diagnosis Date  . CAD (coronary artery disease)     a. CABG x 4 in 2006 in [place]. b. DES to midbody of SVG-PDA/PLA 07/2012.  . Labile hypertension   . Dyslipidemia     Intolerant of statins  . Diverticulosis   . IBS (irritable bowel syndrome)   . Obesity   . Hyperplastic colon polyp   . Fecal incontinence   . Multiple allergies   . Anxiety   . Obesity   . Complication of anesthesia     "quit breathing when I got ?[place]" (07/28/2012)  . Type II diabetes mellitus   . Steatohepatitis   . Migraines     "had them  in my 39's" (07/28/2012)    Past Surgical History  Procedure Date  . Inguinal hernia repair 1970's?    left  . Excisional hemorrhoidectomy 1970's  . Breast lumpectomy     bilateral  . Cardiac catheterization 2006  . Coronary angioplasty with stent placement 07/28/2012    "1; first time for me" (07/28/2012)  . Coronary artery bypass graft 2006    CABG X4  . Abdominal hysterectomy 1970's  . Tonsillectomy and adenoidectomy ~ 1951    History  Smoking status  . Never Smoker   Smokeless tobacco  . Never Used    History  Alcohol Use No    Family History  Problem Relation Age of Onset  . Coronary artery disease Father   . Colon cancer Father   . Colon polyps Father   . Heart disease Father   . Hypertension    . Breast cancer      grandmother  . Diabetes Maternal Grandmother   . Diabetes Paternal Grandmother   . Stroke Mother     Reviw of Systems:  Reviewed in the HPI.  All other systems are negative.  Physical Exam: Blood pressure 178/96, pulse 74, height 5' 3.5" (1.613 m), weight 192 lb 6.4 oz (87.272 kg), SpO2 98.00%. General: Well developed, well nourished, in no acute distress.  Head: Normocephalic, atraumatic, sclera non-icteric, mucus membranes are moist,   Neck: Supple. Carotids are 2 + without bruits. No JVD  Lungs: Clear bilaterally to auscultation.  Heart: regular rate  With normal  S1 S2. No murmurs, gallops or rubs.  Abdomen: Soft, non-tender, non-distended with normal bowel sounds. No hepatomegaly. No rebound/guarding. No masses.  Msk:  Strength and tone are normal  Extremities: No clubbing or cyanosis. No edema.  Distal pedal pulses are 2+ and equal bilaterally.  Her right femoral cath site has a small marble sized knot c/w mild scarring after cath.  Neuro: Alert and oriented X 3. Moves all extremities spontaneously.  Psych:  Responds to questions appropriately with a normal affect.  ECG: Dec. 5, 2013 - NSR at 74, NS ST /T  abnormalities.  Assessment / Plan:

## 2012-08-11 NOTE — Patient Instructions (Addendum)
Your physician wants you to follow-up in: 6 months  You will receive a reminder letter in the mail two months in advance. If you don't receive a letter, please call our office to schedule the follow-up appointment.  Your physician recommends that you continue on your current medications as directed. Please refer to the Current Medication list given to you today.   PLEASE SCHEDULE AN APPOINTMENT WITH YOUR PRIMARY CARE PHYSICIAN TO ASSESS YOUR STOMACH PROBLEMS.

## 2012-08-12 NOTE — Assessment & Plan Note (Signed)
She is doing well and has not had any further episodes of angina since the PCI of the SVG to her RCA.  She has some abdomina pain and I have asked her to check with her medical doctor.  If she needs to have endoscopy, I do not recommend stopping the Effient or ASA at this time since she just had a drug eluting stent placed.    I will see her in 6 months.

## 2012-08-18 ENCOUNTER — Encounter: Payer: Medicare Other | Admitting: Cardiovascular Disease

## 2012-08-24 ENCOUNTER — Telehealth: Payer: Self-pay | Admitting: Internal Medicine

## 2012-08-24 DIAGNOSIS — R109 Unspecified abdominal pain: Secondary | ICD-10-CM

## 2012-08-24 NOTE — Telephone Encounter (Signed)
Patient states she did not follow up because she had to go into hospital to have stints placed. She is now on a blood thinner. She states her stomach still hurts, swells up and gets hard. Pain is above belly button under breasts. She is taking Prevacid daily. She reports constipation alternating with diarrhea. She is not taking anything for constipation but will try Miralax prn. Yesterday, she reports she had rectal bleeding with bowel movement. Reports bright, red blood on tissue. She has not had any more rectal bleeding. She will notify her "heart doctor" about bleeding also since she is on blood thinner. Offered patient OV with extender but she only wants to see Dr. Olevia Perches. She scheduled OV for late January.  Please, advise.

## 2012-08-24 NOTE — Telephone Encounter (Signed)
Patient called because she said had called Dr. Olevia Perches GI  and spoke with his nurse regarding the stomach problem, the nurse recommended for pt to see her cardiologist. Pt was seen by Dr. Acie Fredrickson on 08/11/12 and recommended for pt to call her PCP and make an appointment to assess her stomach problem. Pt states when she spoke the GI nurse she got scare. Pt also states she has constipation problems; yesterday she was pushing hard to have a BM and even reached her hand  in to evacuate and bled a little. Today she is doing fine no problem bleeding. Pt is aware to call her PCP for the stomach problem.

## 2012-08-24 NOTE — Telephone Encounter (Signed)
I agree with an OV , continue Prevacid 30 mg po qd. Continue Levsin SL.125 mg,, before she sees me, she ought to have a HIDA scan with [facility] because of abnormal ultrasound.

## 2012-08-24 NOTE — Telephone Encounter (Signed)
New problem:   C/O stomach swelling .  GI was contacted advise to call her cardiologist. Bleeding from rectum. Pt just start on blood thinner.

## 2012-08-24 NOTE — Telephone Encounter (Signed)
Scheduled patient on 09/20/12 at 7:45/8:00 AM at [facility] radiology(Alisha) NPO after midnight. No stomach medications on day of procedure. Patient notified of procedure appointment date, time and instructions. She will take Prevacid and Levsin.

## 2012-09-02 ENCOUNTER — Encounter: Payer: Self-pay | Admitting: *Deleted

## 2012-09-20 ENCOUNTER — Encounter (HOSPITAL_COMMUNITY)
Admission: RE | Admit: 2012-09-20 | Discharge: 2012-09-20 | Disposition: A | Discharge status: Home / Self Care | Payer: Medicare Other | Source: Ambulatory Visit | Attending: Internal Medicine | Admitting: Internal Medicine

## 2012-09-20 DIAGNOSIS — R109 Unspecified abdominal pain: Secondary | ICD-10-CM | POA: Insufficient documentation

## 2012-09-20 MED ORDER — TECHNETIUM TC 99M MEBROFENIN IV KIT
5.5000 | PACK | Freq: Once | INTRAVENOUS | Status: AC | PRN
  Administered 2012-09-20: 6 via INTRAVENOUS

## 2012-10-05 ENCOUNTER — Ambulatory Visit: Payer: Medicare Other | Admitting: Internal Medicine

## 2012-10-24 ENCOUNTER — Telehealth: Payer: Self-pay | Admitting: *Deleted

## 2012-10-24 NOTE — Telephone Encounter (Signed)
PT STOPPED TO REQUEST SAMPLES OF EFFIENT, PT WAS CALLED BACK, NO EFFIENT SAMPLES AVAILABLE. MSG WAS LEFT.

## 2012-11-18 ENCOUNTER — Encounter: Payer: Self-pay | Admitting: Internal Medicine

## 2012-11-18 ENCOUNTER — Ambulatory Visit (INDEPENDENT_AMBULATORY_CARE_PROVIDER_SITE_OTHER): Payer: Medicare Other | Admitting: Internal Medicine

## 2012-11-18 VITALS — BP 170/86 | HR 86 | Ht 62.75 in | Wt 194.5 lb

## 2012-11-18 DIAGNOSIS — Z800 Family history of malignant neoplasm of digestive organs: Secondary | ICD-10-CM

## 2012-11-18 DIAGNOSIS — R198 Other specified symptoms and signs involving the digestive system and abdomen: Secondary | ICD-10-CM

## 2012-11-18 NOTE — Progress Notes (Signed)
Ruth [name] 14-Mar-1946 MRN [id number]  History of Present Illness:  This is a 67 year old white female who is here for a follow up appointment for rectal bleeding and abdominal pain. She has constipation and rectal sphincter incompetence. She is complaining of bloating. She has a history of coronary artery disease and is status post bypass grafting in 2006 and recent coronary stents placed by Dr.Nasher. She is currently on Effient and will stay on it for about 1 year. She uses manual disimpaction due to inability to evacuate stool. There is a family history of colon cancer in her father who is our patient. Other medical problems include diabetes, obesity and fatty liver. An upper abdominal ultrasound in October 2013 showed hepatomegaly and a gallbladder polyp with normal thickness of the gallbladder wall. The common bile duct was 7.4 mm. Her last colonoscopy in 2005 in [place] showed polyps. Her recent HIDA scan with CCK showed a 62% ejection fraction.     Past Medical History  Diagnosis Date  . CAD (coronary artery disease)     a. CABG x 4 in 2006 in [place]. b. DES to midbody of SVG-PDA/PLA 07/2012.  . Labile hypertension   . Dyslipidemia     Intolerant of statins  . Diverticulosis   . IBS (irritable bowel syndrome)   . Obesity   . Hyperplastic colon polyp   . Fecal incontinence   . Multiple allergies   . Anxiety   . Obesity   . Complication of anesthesia     "quit breathing when I got ?Inovar" (07/28/2012)  . Type II diabetes mellitus   . Steatohepatitis   . Migraines     "had them in my 30's" (07/28/2012)  . GERD (gastroesophageal reflux disease)    Past Surgical History  Procedure Laterality Date  . Inguinal hernia repair  1970's?    left  . Excisional hemorrhoidectomy  1970's  . Breast lumpectomy      bilateral  . Cardiac catheterization  2006  . Coronary angioplasty with stent placement  07/28/2012    "1; first time for me" (07/28/2012)  . Coronary artery  bypass graft  2006    CABG X4  . Abdominal hysterectomy  1970's  . Tonsillectomy and adenoidectomy  ~ 1951    reports that she has never smoked. She has never used smokeless tobacco. She reports that she does not drink alcohol or use illicit drugs. family history includes Breast cancer in an unspecified family member; Colon cancer in her father; Colon polyps in her father; Coronary artery disease in her father; Diabetes in her maternal grandmother and paternal grandmother; Heart disease in her father; Hypertension in an unspecified family member; and Stroke in her mother. Allergies  Allergen Reactions  . Betadine (Povidone Iodine) Swelling  . Codeine Anaphylaxis and Other (See Comments)    "quit breathing" (07/28/2012)  . Demerol Other (See Comments)    "quit breathing" (07/28/2012)  . Latex Other (See Comments)    "quit breathing" (07/28/2012)  . Other Other (See Comments)    Perfume, Any Fragrance. "quit breathing" (07/28/2012)  . Percocet (Oxycodone-Acetaminophen) Other (See Comments)    "quit breathing; disoriented" (07/28/2012)  . Plavix (Clopidogrel Bisulfate) Other (See Comments)    "get hot; like I'm burning up inside; had to put me in shower after OHS because of that" (07/28/2012)  . Red Dye Other (See Comments)    "quit breathing" (07/28/2012)  . Shrimp (Shellfish Allergy) Shortness Of Breath and Other (See Comments)    "  broke out in knots all over" (07/28/2012)  . Shrimp Flavor Shortness Of Breath and Other (See Comments)    "broke out in knots all over" (07/28/2012)  . Sulfonamide Derivatives Rash and Other (See Comments)    "quit breathing" (07/28/2012)  . Tylenol (Acetaminophen) Shortness Of Breath  . Iohexol     Unknown        Review of Systems:Negative for heartburn chest pain or shortness of breath   The remainder of the 10 point ROS is negative except as outlined in H&P   Physical Exam: General appearance  Well developed, in no distress. overweight   Eyes- non icteric. HEENT nontraumatic, normocephalic. Mouth no lesions, tongue papillated, no cheilosis. Neck supple without adenopathy, thyroid not enlarged, no carotid bruits, no JVD. Lungs Clear to auscultation bilaterally. Cor normal S1, normal S2, regular rhythm, no murmur,  quiet precordium. Abdomen:  obese soft with normoactive bowel sounds. Tenderness along left lower and left middle quadrant. No palpable mass.  Rectal: decreased rectal sphincter tone. External hemorrhoids. Soft Hemoccult negative stool.  Extremities: trace pedal edema. Skin no lesions. Neurological alert and oriented x 3. Psychological normal mood and affect.  Assessment and Plan:  Problem #80 67 year old white female with change in bowel habits toward constipation. I suspect a rectocele is causing  difficulty with stool evacuation. She has a positive family history of colorectal cancer in her father and a personal history of polyps. She is due for a repeat colonoscopy but she has to leave for [place] for about a month to sell her house after her husband passed away and will be back next month. A colonoscopy will continue Effient, in order to minimize the risk of stent thrombosis. For now, she will use magnesium oxide 500 mg 1-2 tablets daily and glycerin suppositories to evacuate stool from the rectum.    11/18/2012 [name]

## 2012-11-18 NOTE — Patient Instructions (Addendum)
Please take Magnesium Oxide (over the counter) 500 mg--Take 1-2 tablets by mouth every night.  You will be due for colonoscopy in April 2014. Per your request, we will wait to schedule the test for the time being. We will send you a reminder next month to call back and schedule your procedure. You will remain ON Effient per your cardiologist for the procedure.  CC: Dr Crist Infante, Dr Cathie Olden

## 2012-11-24 ENCOUNTER — Telehealth: Payer: Self-pay | Admitting: Cardiovascular Disease

## 2012-11-24 NOTE — Telephone Encounter (Signed)
She wants to be seen soon for bilateral ankle pain with slight edema, denies redness or warmth in ankles. She believes it is effient causing the pain. She had a high bp last eve and this am of 192/93 but stated it has come down now. She denies excessive sodium intake, she wants to take more amlodipine when her bp is elevated. I told her that for 2 elevated bp's she should not take more amlodipine and one SE of amlodipine is swelling and she shouldn't take more till evaluated.  I gave her several choices to accommodate her. I offered to dbl book Dr Acie Fredrickson  3/28, offered a soon app with PA/NP or she could see her pcp. Pt  Decided to see her pcp.

## 2012-11-24 NOTE — Telephone Encounter (Signed)
New Problem   Per pt blood pressure up 192/93 legs giving way she takes amlodipine(2.5 mg) pt wants to know if she can take 5 mg b/c sometimes that brings her b/p down- offered pt to see a PA but she declined, wants to see dr Mayra Reel call pt to advice

## 2012-11-30 ENCOUNTER — Encounter: Payer: Self-pay | Admitting: Internal Medicine

## 2012-12-14 DIAGNOSIS — R252 Cramp and spasm: Secondary | ICD-10-CM | POA: Insufficient documentation

## 2012-12-15 ENCOUNTER — Other Ambulatory Visit: Payer: Self-pay

## 2012-12-15 ENCOUNTER — Emergency Department (HOSPITAL_COMMUNITY)
Admission: EM | Admit: 2012-12-15 | Discharge: 2012-12-15 | Disposition: A | Discharge status: Home / Self Care | Payer: Medicare Other | Attending: Emergency Medicine | Admitting: Emergency Medicine

## 2012-12-15 ENCOUNTER — Encounter (HOSPITAL_COMMUNITY): Payer: Self-pay | Admitting: Emergency Medicine

## 2012-12-15 DIAGNOSIS — Z8639 Personal history of other endocrine, nutritional and metabolic disease: Secondary | ICD-10-CM | POA: Insufficient documentation

## 2012-12-15 DIAGNOSIS — Z7982 Long term (current) use of aspirin: Secondary | ICD-10-CM | POA: Insufficient documentation

## 2012-12-15 DIAGNOSIS — E119 Type 2 diabetes mellitus without complications: Secondary | ICD-10-CM | POA: Insufficient documentation

## 2012-12-15 DIAGNOSIS — I2510 Atherosclerotic heart disease of native coronary artery without angina pectoris: Secondary | ICD-10-CM | POA: Insufficient documentation

## 2012-12-15 DIAGNOSIS — Z8659 Personal history of other mental and behavioral disorders: Secondary | ICD-10-CM | POA: Insufficient documentation

## 2012-12-15 DIAGNOSIS — Z8679 Personal history of other diseases of the circulatory system: Secondary | ICD-10-CM | POA: Insufficient documentation

## 2012-12-15 DIAGNOSIS — Z862 Personal history of diseases of the blood and blood-forming organs and certain disorders involving the immune mechanism: Secondary | ICD-10-CM | POA: Insufficient documentation

## 2012-12-15 DIAGNOSIS — Z79899 Other long term (current) drug therapy: Secondary | ICD-10-CM | POA: Insufficient documentation

## 2012-12-15 DIAGNOSIS — Z860100 Personal history of colon polyps, unspecified: Secondary | ICD-10-CM | POA: Insufficient documentation

## 2012-12-15 DIAGNOSIS — Z951 Presence of aortocoronary bypass graft: Secondary | ICD-10-CM | POA: Insufficient documentation

## 2012-12-15 DIAGNOSIS — I10 Essential (primary) hypertension: Secondary | ICD-10-CM | POA: Insufficient documentation

## 2012-12-15 DIAGNOSIS — Z8719 Personal history of other diseases of the digestive system: Secondary | ICD-10-CM | POA: Insufficient documentation

## 2012-12-15 DIAGNOSIS — E669 Obesity, unspecified: Secondary | ICD-10-CM | POA: Insufficient documentation

## 2012-12-15 LAB — POCT I-STAT, CHEM 8
BUN: 13 mg/dL (ref 6–23)
BUN: 14 mg/dL (ref 6–23)
Calcium, Ion: 1.23 mmol/L (ref 1.13–1.30)
Chloride: 102 mEq/L (ref 96–112)
Chloride: 102 mEq/L (ref 96–112)
Creatinine, Ser: 0.6 mg/dL (ref 0.50–1.10)
Creatinine, Ser: 0.7 mg/dL (ref 0.50–1.10)
Glucose, Bld: 138 mg/dL — ABNORMAL HIGH (ref 70–99)
Glucose, Bld: 140 mg/dL — ABNORMAL HIGH (ref 70–99)
HCT: 45 % (ref 36.0–46.0)
Hemoglobin: 15.3 g/dL — ABNORMAL HIGH (ref 12.0–15.0)
Potassium: 3.8 mEq/L (ref 3.5–5.1)
Potassium: 3.9 mEq/L (ref 3.5–5.1)
Sodium: 140 mEq/L (ref 135–145)

## 2012-12-15 NOTE — ED Notes (Signed)
Saw PCP yesterday BP was 192/92. Pt took BP at home this am and it was still high she took 50 metoprolol at 0500. BP continued to go up she took another 50 mg at 1000 after calling her PCP. She called him back at 1330 he instructed her to take 2 Norvasc. She took these and BP has not come down. She called her PCP again and he instructed her to take another 50 mg of metoprolol and come to ED. Pt becomes extremely anxious when she hears what her BP is. First BP 238/110 checked by EMS, pt calmed down a little BP was rechecked 224/117. In right arm and Left arm 268/132.

## 2012-12-15 NOTE — ED Notes (Signed)
Pt dc to home.   Pt states understanding to dc instructions.  Pt ambulatory to exit without difficulty.  Pt denies need for w/c.

## 2012-12-15 NOTE — ED Provider Notes (Signed)
History     CSN: [id number]  Arrival date & time 12/15/12  1718    Chief Complaint  Patient presents with  . Hypertension    (Consider location/radiation/quality/duration/timing/severity/associated sxs/prior treatment) HPI Comments: 19 y F with hx of labile HTN here for eval 2/2 elevated BPs.  She reports SBPs as high as 260.  She talked to her PCP and took an increased dose of metoprolol (100 mg total in AM, usually on 50 mg) and took an extra dose of hydralazine (5 mg, usually on 2.5 mg nightly).  Asymptomatic.  No HA, CP, SOB.  Patient is a 67 y.o. female presenting with hypertension. The history is provided by the patient.  Hypertension This is a recurrent problem. The current episode started today. The problem occurs constantly. The problem has been gradually improving. Pertinent negatives include no abdominal pain, chest pain, fever, headaches or nausea. Treatments tried: extra doses of metoprolol and hydralazine. The treatment provided mild relief.    Past Medical History  Diagnosis Date  . CAD (coronary artery disease)     a. CABG x 4 in 2006 in [place]. b. DES to midbody of SVG-PDA/PLA 07/2012.  . Labile hypertension   . Dyslipidemia     Intolerant of statins  . Diverticulosis   . IBS (irritable bowel syndrome)   . Obesity   . Hyperplastic colon polyp   . Fecal incontinence   . Multiple allergies   . Anxiety   . Obesity   . Complication of anesthesia     "quit breathing when I got ?Inovar" (07/28/2012)  . Type II diabetes mellitus   . Steatohepatitis   . Migraines     "had them in my 30's" (07/28/2012)  . GERD (gastroesophageal reflux disease)     Past Surgical History  Procedure Laterality Date  . Inguinal hernia repair  1970's?    left  . Excisional hemorrhoidectomy  1970's  . Breast lumpectomy      bilateral  . Cardiac catheterization  2006  . Coronary angioplasty with stent placement  07/28/2012    "1; first time for me" (07/28/2012)  .  Coronary artery bypass graft  2006    CABG X4  . Abdominal hysterectomy  1970's  . Tonsillectomy and adenoidectomy  ~ 1951    Family History  Problem Relation Age of Onset  . Coronary artery disease Father   . Colon cancer Father   . Colon polyps Father   . Heart disease Father   . Hypertension    . Breast cancer      grandmother  . Diabetes Maternal Grandmother   . Diabetes Paternal Grandmother   . Stroke Mother     History  Substance Use Topics  . Smoking status: Never Smoker   . Smokeless tobacco: Never Used  . Alcohol Use: No    OB History   Grav Para Term Preterm Abortions TAB SAB Ect Mult Living                  Review of Systems  Constitutional: Negative for fever.  Eyes: Negative for visual disturbance.  Respiratory: Negative for chest tightness and shortness of breath.   Cardiovascular: Negative for chest pain.  Gastrointestinal: Negative for nausea and abdominal pain.  Neurological: Negative for headaches.  All other systems reviewed and are negative.    Allergies  Betadine; Codeine; Demerol; Latex; Other; Percocet; Plavix; Red dye; Shrimp; Shrimp flavor; Sulfonamide derivatives; Tylenol; and Iohexol  Home Medications   Current Outpatient Rx  Name  Route  Sig  Dispense  Refill  . amLODipine (NORVASC) 2.5 MG tablet   Oral   Take 2.5 mg by mouth every evening.         Marland Kitchen aspirin 81 MG tablet   Oral   Take 1 tablet (81 mg total) by mouth daily.         . calcium carbonate (TUMS - DOSED IN MG ELEMENTAL CALCIUM) 500 MG chewable tablet   Oral   Chew 1 tablet by mouth 2 (two) times daily as needed. For heartburn         . metoprolol (LOPRESSOR) 50 MG tablet   Oral   Take 50 mg by mouth 2 (two) times daily.         . nitroGLYCERIN (NITROSTAT) 0.4 MG SL tablet   Sublingual   Place 1 tablet (0.4 mg total) under the tongue every 5 (five) minutes as needed for chest pain (up to 3 doses).   25 tablet   4   . prasugrel (EFFIENT) 10 MG TABS    Oral   Take 1 tablet (10 mg total) by mouth daily.   30 tablet   10     BP 194/91  Pulse 75  Temp(Src) 98.3 F (36.8 C)  SpO2 97%  Physical Exam  Constitutional: She is oriented to person, place, and time. She appears well-developed and well-nourished.  HENT:  Right Ear: External ear normal.  Left Ear: External ear normal.  Mouth/Throat: No oropharyngeal exudate.  Eyes: Conjunctivae and EOM are normal. Pupils are equal, round, and reactive to light.  Neck: Normal range of motion. Neck supple.  Cardiovascular: Normal rate, regular rhythm, normal heart sounds and intact distal pulses.  Exam reveals no gallop and no friction rub.   No murmur heard. Pulmonary/Chest: Effort normal and breath sounds normal.  Abdominal: Soft. Bowel sounds are normal. She exhibits no distension. There is no tenderness.  Musculoskeletal: Normal range of motion. She exhibits no edema.  Neurological: She is alert and oriented to person, place, and time. No cranial nerve deficit.  Skin: Skin is warm and dry. No rash noted.  Psychiatric: She has a normal mood and affect.    ED Course  Procedures (including critical care time)  Labs Reviewed  POCT I-STAT, CHEM 8 - Abnormal; Notable for the following:    Glucose, Bld 140 (*)    Hemoglobin 15.3 (*)    All other components within normal limits  POCT I-STAT, CHEM 8 - Abnormal; Notable for the following:    Glucose, Bld 138 (*)    Hemoglobin 15.3 (*)    All other components within normal limits  POCT I-STAT TROPONIN I   No results found.  EKG: NSR rate 71, PR 144, QRS 86, QTc 474; RSR pattern, NSTWA  1. Hypertension       MDM   8 y F with hx of labile HTN here for eval 2/2 elevated BPs.  She reports SBPs as high as 260.  She talked to her PCP and took an increased dose of metoprolol (100 mg total in AM, usually on 50 mg) and took an extra dose of hydralazine (5 mg, usually on 2.5 mg nightly).  Asymptomatic.  No HA, CP, SOB.  Exam reassuring.   Given co-morbidities, will eval with EKG, trop and chem-8.  8:10 PM Workup unremarkable.  BPs in 150-160s throughout stay.  Asymptomatic hypertension.  Pt encouraged to resume her previous anti-hypertensive regimen and to f/u with her PCP in the  AM for further recommendations.   Return precautions reviewed.  It is felt the pt is stable for d/c with close PCP f/u.  Pt seen in conjunction with my attending, Dr. Alvino Chapel.  Madaline Brilliant, MD PGY-II [facility] Emergency Medicine Resident    Madaline Brilliant, MD 12/16/12 [phone]

## 2012-12-16 NOTE — ED Provider Notes (Signed)
I saw and evaluated the patient, reviewed the resident's note and I agree with the findings and plan and agree with their ECG interpretation. Patient with hypertension. No chest pain. Had been increased on medicines by PCP. EKG is reassuring. Blood pressure improved here. Will follow by PCP  Jasper Riling. Alvino Chapel, MD 12/16/12 2135

## 2012-12-19 ENCOUNTER — Telehealth: Payer: Self-pay | Admitting: Cardiovascular Disease

## 2012-12-19 NOTE — Telephone Encounter (Signed)
Follow-up:    Patient called in returning your call.  Please call back.

## 2012-12-19 NOTE — Telephone Encounter (Signed)
lmtcb

## 2012-12-19 NOTE — Telephone Encounter (Signed)
Pt states she has been dealing with her bp with pcp Dr Joylene Draft. Also seen in ED/ see visit.  He has doubled her metoprolol 100 mg bid and increased amlodipine 5 mg bid. States her bp is still elevated/ 203/91, Dr Joylene Draft is out of town and so she called and wanted to be seen. Pt declined app with NP/PA, app given 01/12/13 and pt was accepting. Pt to monitor bp/p and avoid salt. Pt told that I will forward note to dr Acie Fredrickson for review, pt accepting of plan.

## 2012-12-19 NOTE — Telephone Encounter (Signed)
New Prob    Was in the hospital for high BP 268/132, over a period of time while in the hospital her BP went down and they sent her home. Pt is very concerned and would like to speak to nurse.

## 2013-01-12 ENCOUNTER — Encounter: Payer: Self-pay | Admitting: Cardiovascular Disease

## 2013-01-12 ENCOUNTER — Ambulatory Visit (INDEPENDENT_AMBULATORY_CARE_PROVIDER_SITE_OTHER): Payer: Medicare Other | Admitting: Cardiovascular Disease

## 2013-01-12 VITALS — BP 180/96 | HR 78 | Ht 62.75 in | Wt 193.0 lb

## 2013-01-12 DIAGNOSIS — I10 Essential (primary) hypertension: Secondary | ICD-10-CM

## 2013-01-12 DIAGNOSIS — I119 Hypertensive heart disease without heart failure: Secondary | ICD-10-CM

## 2013-01-12 MED ORDER — LOSARTAN POTASSIUM 50 MG PO TABS: 50.0000 mg | ORAL_TABLET | Freq: Every day | ORAL | Status: DC

## 2013-01-12 MED ORDER — AMLODIPINE BESYLATE 5 MG PO TABS: 5.0000 mg | ORAL_TABLET | Freq: Two times a day (BID) | ORAL | Status: DC

## 2013-01-12 NOTE — Patient Instructions (Addendum)
Your physician has requested that you have a renal artery duplex. During this test, an ultrasound is used to evaluate blood flow to the kidneys. Allow one hour for this exam. Do not eat after midnight the day before and avoid carbonated beverages. Take your medications as you usually do.  Your physician wants you to follow-up in: [place] will receive a reminder letter in the mail two months in advance. If you don't receive a letter, please call our office to schedule the follow-up appointment.   Your physician has recommended you make the following change in your medication:   Increase amlodipine from to 5 mg twice daily. 12 hours apart  START COZAAR 50 MG DAILY

## 2013-01-12 NOTE — Assessment & Plan Note (Signed)
Ruth [name] continues to have significant hypertension. She was seen in the emergency room recently with significant hypertension. She recorded her blood pressure 260/130. Her blood pressure gradually improved while she was in the emergency room.  We have had difficulty keeping control of her blood pressure. She has multiple intolerances. We will try increasing her amlodipine to 5 mg twice a day. We'll add Cozaar 50 mg a day. I'll try to get a renal artery duplex scan to rule out renal artery stenosis.  I'll see her in 3 months.

## 2013-01-12 NOTE — Progress Notes (Signed)
Ruth [name] Date of Birth  [birth year] Ruth [address]    [place]   [place] Ruth [name], [place]  [address]    [place], [place]  [address] [phone]  Fax  [phone]  [phone]  Fax [phone]  Problem list: 1. Coronary artery disease-status post CABG- 2006, s/p stent placement to SVT to PDA/PLSA (3.5 x 12 mm Promus Element DES, 11/13) 2. Hypertension 3. Hypercholesterolemia  4. Leg pain  History of Present Illness:  [name] has done fairly well since I last saw her. [name] been under lots of stress. Her husband died this past Christmas. [name] also had patent help her mother who is not in good health right now.  She recently presented with unstable angina and a myoview revealed lateral ischemia.  She was found to have a tight stenosis and she had a stent placed in the SVG to PDA / PLSA. [name] not had any recurrent chest pain since the stent to the saphenous vein graft. She has had some stomach issues. She denies any blood in her stool. She takes antacids and PPIs. She does not have any real shortness breath but does feel the need to take a deep breath on occasion. She has not been exercising.  She has gained some weight.    Her BP has been a little elevated.  It was better controlled on Bystolic but she is back on Metoprolol due to costs.   Jan 12, 2013:  She has had some problems with HTN.  She was seen in th ER   Current Outpatient Prescriptions on File Prior to Visit  Medication Sig Dispense Refill  . amLODipine (NORVASC) 2.5 MG tablet Take by mouth every evening. Tsking 27m in AM and 2.516min PM      . aspirin 81 MG tablet Take 1 tablet (81 mg total) by mouth daily.      . calcium carbonate (TUMS - DOSED IN MG ELEMENTAL CALCIUM) 500 MG chewable tablet Chew 1 tablet by mouth 2 (two) times daily as needed. For heartburn      . metoprolol (LOPRESSOR) 50 MG tablet Take 100 mg by mouth 2 (two) times daily.       .  nitroGLYCERIN (NITROSTAT) 0.4 MG SL tablet Place 1 tablet (0.4 mg total) under the tongue every 5 (five) minutes as needed for chest pain (up to 3 doses).  25 tablet  4  . prasugrel (EFFIENT) 10 MG TABS Take 1 tablet (10 mg total) by mouth daily.  30 tablet  10   No current facility-administered medications on file prior to visit.    Allergies  Allergen Reactions  . Betadine (Povidone Iodine) Swelling  . Codeine Anaphylaxis and Other (See Comments)    "quit breathing" (07/28/2012)  . Demerol Other (See Comments)    "quit breathing" (07/28/2012)  . Latex Other (See Comments)    "quit breathing" (07/28/2012)  . Other Other (See Comments)    Perfume, Any Fragrance. "quit breathing" (07/28/2012)  . Percocet (Oxycodone-Acetaminophen) Other (See Comments)    "quit breathing; disoriented" (07/28/2012)  . Plavix (Clopidogrel Bisulfate) Other (See Comments)    "get hot; like I'm burning up inside; had to put me in shower after OHS because of that" (07/28/2012)  . Red Dye Other (See Comments)    "quit breathing" (07/28/2012)  . Shrimp (Shellfish Allergy) Shortness Of Breath and Other (See Comments)    "broke out in knots all over" (07/28/2012)  .  Shrimp Flavor Shortness Of Breath and Other (See Comments)    "broke out in knots all over" (07/28/2012)  . Sulfonamide Derivatives Rash and Other (See Comments)    "quit breathing" (07/28/2012)  . Tylenol (Acetaminophen) Shortness Of Breath  . Iohexol     Unknown    Past Medical History  Diagnosis Date  . CAD (coronary artery disease)     a. CABG x 4 in 2006 in [place]. b. DES to midbody of SVG-PDA/PLA 07/2012.  . Labile hypertension   . Dyslipidemia     Intolerant of statins  . Diverticulosis   . IBS (irritable bowel syndrome)   . Obesity   . Hyperplastic colon polyp   . Fecal incontinence   . Multiple allergies   . Anxiety   . Obesity   . Complication of anesthesia     "quit breathing when I got ?Inovar" (07/28/2012)  .  Type II diabetes mellitus   . Steatohepatitis   . Migraines     "had them in my 30's" (07/28/2012)  . GERD (gastroesophageal reflux disease)     Past Surgical History  Procedure Laterality Date  . Inguinal hernia repair  1970's?    left  . Excisional hemorrhoidectomy  1970's  . Breast lumpectomy      bilateral  . Cardiac catheterization  2006  . Coronary angioplasty with stent placement  07/28/2012    "1; first time for me" (07/28/2012)  . Coronary artery bypass graft  2006    CABG X4  . Abdominal hysterectomy  1970's  . Tonsillectomy and adenoidectomy  ~ 1951    History  Smoking status  . Never Smoker   Smokeless tobacco  . Never Used    History  Alcohol Use No    Family History  Problem Relation Age of Onset  . Coronary artery disease Father   . Colon cancer Father   . Colon polyps Father   . Heart disease Father   . Hypertension    . Breast cancer      grandmother  . Diabetes Maternal Grandmother   . Diabetes Paternal Grandmother   . Stroke Mother     Reviw of Systems:  Reviewed in the HPI.  All other systems are negative.  Physical Exam: Blood pressure 180/96, pulse 78, height 5' 2.75" (1.594 m), weight 193 lb (87.544 kg). General: Well developed, well nourished, in no acute distress.  Head: Normocephalic, atraumatic, sclera non-icteric, mucus membranes are moist,   Neck: Supple. Carotids are 2 + without bruits. No JVD  Lungs: Clear bilaterally to auscultation.  Heart: regular rate  With normal  S1 S2. No murmurs, gallops or rubs.  Abdomen: Soft, non-tender, non-distended with normal bowel sounds. No hepatomegaly. No rebound/guarding. No masses.  Msk:  Strength and tone are normal  Extremities: No clubbing or cyanosis. No edema.  Distal pedal pulses are 2+ and equal bilaterally.  Her right femoral cath site has a small marble sized knot c/w mild scarring after cath.  Neuro: Alert and oriented X 3. Moves all extremities spontaneously.  Psych:   Responds to questions appropriately with a normal affect.  ECG: Dec. 5, 2013 - NSR at 74, NS ST /T abnormalities.  Assessment / Plan:

## 2013-01-23 ENCOUNTER — Encounter (INDEPENDENT_AMBULATORY_CARE_PROVIDER_SITE_OTHER): Payer: Medicare Other

## 2013-01-23 DIAGNOSIS — I10 Essential (primary) hypertension: Secondary | ICD-10-CM

## 2013-01-25 ENCOUNTER — Telehealth: Payer: Self-pay | Admitting: *Deleted

## 2013-01-25 NOTE — Telephone Encounter (Signed)
Normal renal results given, pt verbalized understanding.

## 2013-02-14 DIAGNOSIS — M7542 Impingement syndrome of left shoulder: Secondary | ICD-10-CM | POA: Insufficient documentation

## 2013-04-17 ENCOUNTER — Ambulatory Visit: Payer: 59 | Admitting: Cardiovascular Disease

## 2013-07-03 ENCOUNTER — Other Ambulatory Visit: Payer: Self-pay

## 2013-07-03 ENCOUNTER — Telehealth: Payer: Self-pay | Admitting: Internal Medicine

## 2013-07-03 DIAGNOSIS — R197 Diarrhea, unspecified: Secondary | ICD-10-CM

## 2013-07-03 DIAGNOSIS — Z1231 Encounter for screening mammogram for malignant neoplasm of breast: Secondary | ICD-10-CM

## 2013-07-03 MED ORDER — METRONIDAZOLE 250 MG PO TABS: 250.0000 mg | ORAL_TABLET | Freq: Three times a day (TID) | ORAL | Status: DC

## 2013-07-03 NOTE — Telephone Encounter (Signed)
LOV 11/2012 for severe constipation,  Now diarrhea. Was supposed to have a colonoscopy in the spring. Why did not she schedule? Please obtain stool for lactoferrin, C&S, C.Diff,  Give course of Flagyl 250 mg po tid x 7 days. I will see her.

## 2013-07-03 NOTE — Telephone Encounter (Signed)
Patient is having loose stools. She is not taking Magnesium tablets. States she has had this for a month. She cannot tell me how many stools she is having per day. She also reports hemorrhoid problems and is having some bright, red blood in stool. She reports she has not been on any antibiotics. She also denies abdominal pain. She states she is on a "new heart medication" but nothing else new. Offered to schedule with extender but patient only wants Dr. Olevia Perches. Scheduled patient on 07/21/13 at 2:00 PM.

## 2013-07-03 NOTE — Telephone Encounter (Signed)
OK 

## 2013-07-03 NOTE — Telephone Encounter (Signed)
Spoke with patient and she states she had a cousin to pass away and she cannot get the stool kit until Wednesday. She states she is allergic to most antibiotics and does not want to take Flagyl at this time. She will keep her OV. She did not schedule her procedure in the spring because of her husband's recent death.

## 2013-07-03 NOTE — Telephone Encounter (Signed)
Unable to reach patient and voice mail not set up. Will try again later.

## 2013-07-04 NOTE — Telephone Encounter (Signed)
Spoke with patient and told her lab gives out the kits. She states she cannot come until 11/26/2022 because of a death in her family.

## 2013-07-06 ENCOUNTER — Other Ambulatory Visit: Payer: Medicare Other

## 2013-07-06 DIAGNOSIS — R197 Diarrhea, unspecified: Secondary | ICD-10-CM

## 2013-07-07 LAB — CLOSTRIDIUM DIFFICILE BY PCR: Toxigenic C. Difficile by PCR: NOT DETECTED

## 2013-07-07 LAB — FECAL LACTOFERRIN, QUANT: Lactoferrin: NEGATIVE

## 2013-07-21 ENCOUNTER — Ambulatory Visit (INDEPENDENT_AMBULATORY_CARE_PROVIDER_SITE_OTHER): Payer: Medicare Other | Admitting: Internal Medicine

## 2013-07-21 ENCOUNTER — Encounter: Payer: Self-pay | Admitting: Internal Medicine

## 2013-07-21 VITALS — BP 170/82 | HR 70 | Ht 62.5 in | Wt 194.0 lb

## 2013-07-21 DIAGNOSIS — K648 Other hemorrhoids: Secondary | ICD-10-CM

## 2013-07-21 DIAGNOSIS — K589 Irritable bowel syndrome without diarrhea: Secondary | ICD-10-CM

## 2013-07-21 DIAGNOSIS — R197 Diarrhea, unspecified: Secondary | ICD-10-CM

## 2013-07-21 NOTE — Progress Notes (Signed)
Ruth [name] 1945/09/09 MRN [id number]   History of Present Illness:  This is a 67 year old white female with diabetes, fatty liver and obesity. She also has coronary artery disease. She is status post coronary artery bypass graft in 2006 and a recent coronary stent by Dr Acie Fredrickson. She is now followed by Dr Einar Gip. She is currently on Effient. She is complaining of increasing diarrhea, urgency and incontinence since she  started on Effient. She had problems with the fecal incontinence prior to stent placement. diabetes. She also has symptoms of incomplete evacuation, rectal irritation and bleeding. Her last colonoscopy according to our records was in [place] in 2005 and she had a hyperplastic polyp but she says she had a colonoscopy since then at [facility]. We will request those records and just received and show last colonoscopy by Dr Annitta Needs  On 04/16/2009 followed by anorectal manometry which showed weak shpincter tone and pelvic floor dysfunction.. She has been unable to lose weight despite of being on a diabetic diet and carbohydrate modification. She has multiple allergies to medications and environmental allergens.   Past Medical History  Diagnosis Date  . CAD (coronary artery disease)     a. CABG x 4 in 2006 in [place]. b. DES to midbody of SVG-PDA/PLA 07/2012.  . Labile hypertension   . Dyslipidemia     Intolerant of statins  . Diverticulosis   . IBS (irritable bowel syndrome)   . Obesity   . Hyperplastic colon polyp   . Fecal incontinence   . Multiple allergies   . Anxiety   . Obesity   . Complication of anesthesia     "quit breathing when I got ?[place]" (07/28/2012)  . Type II diabetes mellitus   . Steatohepatitis   . Migraines     "had them in my 30's" (07/28/2012)  . GERD (gastroesophageal reflux disease)    Past Surgical History  Procedure Laterality Date  . Inguinal hernia repair  1970's?    left  . Excisional hemorrhoidectomy  1970's  . Breast  lumpectomy      bilateral  . Cardiac catheterization  2006  . Coronary angioplasty with stent placement  07/28/2012    "1; first time for me" (07/28/2012)  . Coronary artery bypass graft  2006    CABG X4  . Abdominal hysterectomy  1970's  . Tonsillectomy and adenoidectomy  ~ 1951    reports that she has never smoked. She has never used smokeless tobacco. She reports that she does not drink alcohol or use illicit drugs. family history includes Breast cancer in an other family member; Colon cancer in her father; Colon polyps in her father; Coronary artery disease in her father; Diabetes in her maternal grandmother and paternal grandmother; Heart disease in her father; Hypertension in an other family member; Stroke in her mother. Allergies  Allergen Reactions  . Betadine [Povidone Iodine] Swelling  . Codeine Anaphylaxis and Other (See Comments)    "quit breathing" (07/28/2012)  . Demerol Other (See Comments)    "quit breathing" (07/28/2012)  . Latex Other (See Comments)    "quit breathing" (07/28/2012)  . Other Other (See Comments)    Perfume, Any Fragrance. "quit breathing" (07/28/2012)  . Percocet [Oxycodone-Acetaminophen] Other (See Comments)    "quit breathing; disoriented" (07/28/2012)  . Plavix [Clopidogrel Bisulfate] Other (See Comments)    "get hot; like I'm burning up inside; had to put me in shower after OHS because of that" (07/28/2012)  . Red Dye Other (See Comments)    "  quit breathing" (07/28/2012)  . Shrimp Flavor Shortness Of Breath and Other (See Comments)    "broke out in knots all over" (07/28/2012)  . Shrimp [Shellfish Allergy] Shortness Of Breath and Other (See Comments)    "broke out in knots all over" (07/28/2012)  . Sulfonamide Derivatives Rash and Other (See Comments)    "quit breathing" (07/28/2012)  . Tylenol [Acetaminophen] Shortness Of Breath  . Iohexol     Unknown        Review of Systems: Denies dysphagia, heartburn, positive for rectal  bleeding  The remainder of the 10 point ROS is negative except as outlined in H&P   Physical Exam: General appearance  Well developed, in no distress. Mildly overweight Eyes- non icteric. HEENT nontraumatic, normocephalic. Mouth no lesions, tongue papillated, no cheilosis. Neck supple without adenopathy, thyroid not enlarged, no carotid bruits, no JVD. Lungs Clear to auscultation bilaterally. Post thoracotomy scar Cor normal S1, normal S2, regular rhythm, no murmur,  quiet precordium. Abdomen: Protuberant soft with mild tenderness along the left costal margin. No tympany or distention. Rectal: Large external hemorrhoids. Decreased rectal sphincter tone. Large amount of mucus and Hemoccult negative stool in the rectal ampulla. Extremities no pedal edema. Skin no lesions. Neurological alert and oriented x 3. Psychological normal mood and affect.  Assessment and Plan:  Problem #62 67 year old white female with a combination of rectal and pelvic floor  dysfunction fully evaluated  At [facility] in Aug 2010. She also has diabetic visceral neuropathy, and an IBS.Marland Kitchen On top of it, she has a lot of anxiety and is concerned about multiple allergies.. I am ,frankly, quite afraid to put her on any  New  medications because of the possibility of allergic reaction. I have given her samples of Recticare to apply 2-3 times a day to external hemorrhoids into the anal canal. I have suggested that she continues the manual disimpaction but is very careful not to injure the rectum. She was unable to take suppositories which were prescribed by Dr. Joylene Draft. She will stay on a high-fiber carb modified diet. She may consider using antispasmodics if she is willing to try them but she is afraid to try any new medications at this point. She needs to have a colonoscopy to further assess her rectal bleeding and intermittent diarrhea. She is not a surgical candidate for rectocele repair because of multiple medical  problems. She also has her mind made up about what she wants and what she doesn't want. At this point, she is not interested in a pelvic floor evaluation by Ileana Roup at the urology center. We will contact Dr.Ganje as to when it would be feasible for the patient to have stop Effient to have  a colonoscopy. I will tentatively schedule her for January 2015.   07/21/2013 [name]

## 2013-07-21 NOTE — Patient Instructions (Addendum)
We will try to obtain your last colonoscopy from [facility].  We have contacted Dr Irven Shelling office regarding whether you can come off of your Effient for a procedure. We will contact you when we get an answer from him.  You have been scheduled for a colonoscopy with propofol. Please follow written instructions given to you at your visit today.  Please pick up your prep kit at the pharmacy within the next 1-3 days. If you use inhalers (even only as needed), please bring them with you on the day of your procedure. Your physician has requested that you go to www.startemmi.com and enter the access code given to you at your visit today. This web site gives a general overview about your procedure. However, you should still follow specific instructions given to you by our office regarding your preparation for the procedure.  Cc: Dr Joylene Draft, Dr Nadyne Coombes

## 2013-07-26 ENCOUNTER — Telehealth: Payer: Self-pay | Admitting: *Deleted

## 2013-07-26 NOTE — Telephone Encounter (Signed)
Reviewed ans agree.

## 2013-07-26 NOTE — Telephone Encounter (Signed)
I have spoken to patient to advise that Dr Einar Gip states that in the past she wanted to proceed with colonoscopy while on ASA and Effient and come back for a 2nd procedure off of the medication x 7 days if a biopsy were needed. He says it is her choice, but from his standpoint, she is able to d/c Effient at any time since it has been over 1 year since her PTCA. Patient states that she will discontinue the Effient probably in December and will contact Dr Einar Gip about this because she wants to be sure what she is and is not supposed to be taking. Dr Olevia Perches aware.

## 2013-08-21 ENCOUNTER — Ambulatory Visit: Payer: Medicare Other

## 2013-09-12 DIAGNOSIS — B379 Candidiasis, unspecified: Secondary | ICD-10-CM | POA: Insufficient documentation

## 2013-09-13 ENCOUNTER — Telehealth: Payer: Self-pay | Admitting: Internal Medicine

## 2013-09-13 MED ORDER — MOVIPREP 100 G PO SOLR: 1.0000 | Freq: Once | ORAL | Status: DC

## 2013-09-13 NOTE — Telephone Encounter (Signed)
rx sent

## 2013-09-15 ENCOUNTER — Telehealth: Payer: Self-pay | Admitting: Internal Medicine

## 2013-09-15 NOTE — Telephone Encounter (Signed)
Patient is no longer on Effient. She just wanted to make sure she is able to take all medications prior to her procedure. I advised her that she may.

## 2013-09-19 ENCOUNTER — Ambulatory Visit (AMBULATORY_SURGERY_CENTER): Payer: Medicare Other | Admitting: Internal Medicine

## 2013-09-19 ENCOUNTER — Encounter: Payer: Self-pay | Admitting: Internal Medicine

## 2013-09-19 VITALS — BP 134/55 | HR 68 | Temp 97.5°F | Resp 19 | Ht 62.5 in | Wt 194.0 lb

## 2013-09-19 DIAGNOSIS — D126 Benign neoplasm of colon, unspecified: Secondary | ICD-10-CM

## 2013-09-19 DIAGNOSIS — K648 Other hemorrhoids: Secondary | ICD-10-CM

## 2013-09-19 DIAGNOSIS — K589 Irritable bowel syndrome without diarrhea: Secondary | ICD-10-CM

## 2013-09-19 DIAGNOSIS — R197 Diarrhea, unspecified: Secondary | ICD-10-CM

## 2013-09-19 MED ORDER — SODIUM CHLORIDE 0.9 % IV SOLN: 500.0000 mL | INTRAVENOUS | Status: DC

## 2013-09-19 NOTE — Op Note (Signed)
[place]  [address] [place], [address]   COLONOSCOPY PROCEDURE REPORT  PATIENT: Ruth, [name]  MR#: [id number] BIRTHDATE: 05/17/1946 , 89  yrs. old GENDER: Female ENDOSCOPIST: Lafayette Dragon, MD REFERRED HC:WCBJ Perini, M.D. PROCEDURE DATE:  09/19/2013 PROCEDURE:   Colonoscopy with cold biopsy polypectomy First Screening Colonoscopy - Avg.  risk and is 50 yrs.  old or older - No.  Prior Negative Screening - Now for repeat screening. 10 or more years since last screening Prior Negative Screening - Now for repeat screening.  Other: See Comments  History of Adenoma - Now for follow-up colonoscopy & has been > or = to 3 yrs.  N/A Polyps Removed Today? Yes. ASA CLASS:   Class II INDICATIONS:Rectal Bleeding and the leakage of stool, diarrhea, prior colonoscopy in 2005 hyperplastic polyp, and 2010.  Pelvic relaxation evaluated at [facility] in 2010. MEDICATIONS: MAC sedation, administered by CRNA and propofol (Diprivan) 368m IV  DESCRIPTION OF PROCEDURE:   After the risks benefits and alternatives of the procedure were thoroughly explained, informed consent was obtained.  A digital rectal exam revealed decreased sphincter tone.   The LB [id number] endoscope was introduced through the anus and advanced to the cecum, which was identified by both the appendix and ileocecal valve. No adverse events experienced.   The quality of the prep was good, using MoviPrep The instrument was then slowly withdrawn as the colon was fully examined.      COLON FINDINGS: A polypoid shaped sessile polyp ranging between 3-543min size was found at the cecum.  A polypectomy was performed with cold forceps.  The resection was complete and the polyp tissue was completely retrieved.   There was moderate diverticulosis noted throughout the entire examined colon with associated tortuosity, muscular hypertrophy, colonic narrowing and angulation.   Decreased rectal  sphincter tone.  Macerated mucosa within the anal canal. Retroflexed views revealed no abnormalities. The time to cecum=9 minutes 08 seconds.  Withdrawal time=8 minutes 30 seconds.  The scope was withdrawn and the procedure completed. COMPLICATIONS: There were no complications.  ENDOSCOPIC IMPRESSION: 1.   Sessile polyp ranging between 3-68m57mn size was found at the cecum; polypectomy was performed with cold forceps 2.   There was moderate diverticulosis noted throughout the entire examined colon 3.   Decreased rectal sphincter tone.  Macerated mucosa within the anal canal 4.random biopsies of the left colon to rule out microscopic colitis 5.Perirectal erythema due  to stool incontinence  RECOMMENDATIONS: 1.  Await pathology results 2.  Calmoseptine ointment bid and prn High-fiber diet Recall colonoscopy pending pathology   eSigned:  DorLafayette DragonD 09/19/2013 2:11 PM   cc:   PATIENT NAME:  [name], [name]: [id number]

## 2013-09-19 NOTE — Patient Instructions (Signed)
YOU HAD AN ENDOSCOPIC PROCEDURE TODAY AT [facility]: Refer to the procedure report that was given to you for any specific questions about what was found during the examination.  If the procedure report does not answer your questions, please call your gastroenterologist to clarify.  If you requested that your care partner not be given the details of your procedure findings, then the procedure report has been included in a sealed envelope for you to review at your convenience later.  YOU SHOULD EXPECT: Some feelings of bloating in the abdomen. Passage of more gas than usual.  Walking can help get rid of the air that was put into your GI tract during the procedure and reduce the bloating. If you had a lower endoscopy (such as a colonoscopy or flexible sigmoidoscopy) you may notice spotting of blood in your stool or on the toilet paper. If you underwent a bowel prep for your procedure, then you may not have a normal bowel movement for a few days.  DIET: Your first meal following the procedure should be a light meal and then it is ok to progress to your normal diet.  A half-sandwich or bowl of soup is an example of a good first meal.  Heavy or fried foods are harder to digest and may make you feel nauseous or bloated.  Likewise meals heavy in dairy and vegetables can cause extra gas to form and this can also increase the bloating.  Drink plenty of fluids but you should avoid alcoholic beverages for 24 hours.  ACTIVITY: Your care partner should take you home directly after the procedure.  You should plan to take it easy, moving slowly for the rest of the day.  You can resume normal activity the day after the procedure however you should NOT DRIVE or use heavy machinery for 24 hours (because of the sedation medicines used during the test).    SYMPTOMS TO REPORT IMMEDIATELY: A gastroenterologist can be reached at any hour.  During normal business hours, 8:30 AM to 5:00 PM Monday through Friday,  call [phone].  After hours and on weekends, please call the GI answering service at [phone]  Emergency number who will take a message and have the physician on call contact you.   Following lower endoscopy (colonoscopy or flexible sigmoidoscopy):  Excessive amounts of blood in the stool  Significant tenderness or worsening of abdominal pains  Swelling of the abdomen that is new, acute  Fever of 100F or higher  FOLLOW UP: If any biopsies were taken you will be contacted by phone or by letter within the next 1-3 weeks.  Call your gastroenterologist if you have not heard about the biopsies in 3 weeks.  Our staff will call the home number listed on your records the next business day following your procedure to check on you and address any questions or concerns that you may have at that time regarding the information given to you following your procedure. This is a courtesy call and so if there is no answer at the home number and we have not heard from you through the emergency physician on call, we will assume that you have returned to your regular daily activities without incident.  SIGNATURES/CONFIDENTIALITY: You and/or your care partner have signed paperwork which will be entered into your electronic medical record.  These signatures attest to the fact that that the information above on your After Visit Summary has been reviewed and is understood.  Full responsibility of the  confidentiality of this discharge information lies with you and/or your care-partner.  Handout on polyps diverticulosis high fiber diet calmoseptine ointment (over the counter) (samples given) twice a day and as needed You can try over the counter benefiber or metamucil once a day to get in your fiber

## 2013-09-19 NOTE — Progress Notes (Signed)
Patient did not experience any of the following events: a burn prior to discharge; a fall within the facility; wrong site/side/patient/procedure/implant event; or a hospital transfer or hospital admission upon discharge from the facility. (G8907)Patient did not have preoperative order for IV antibiotic SSI prophylaxis. (G8918) ewm 

## 2013-09-19 NOTE — Progress Notes (Signed)
A/ox3 pleased with MAC, report to [employer]

## 2013-09-19 NOTE — Progress Notes (Signed)
Called to room to assist during endoscopic procedure.  Patient ID and intended procedure confirmed with present staff. Received instructions for my participation in the procedure from the performing physician.  

## 2013-09-20 ENCOUNTER — Telehealth: Payer: Self-pay | Admitting: *Deleted

## 2013-09-20 NOTE — Telephone Encounter (Signed)
  Follow up Call-  Call back number 09/19/2013  Post procedure Call Back phone  # [phone] cell  Permission to leave phone message Yes     Patient questions:  Do you have a fever, pain , or abdominal swelling? no Pain Score  0 *  Have you tolerated food without any problems? yes  Have you been able to return to your normal activities? yes  Do you have any questions about your discharge instructions: Diet   no Medications  no Follow up visit  no  Do you have questions or concerns about your Care? no  Actions: * If pain score is 4 or above: No action needed, pain <4.  Pt. States that she is having some "gas discomfort" up high in area of stomach.  She doesn't have fever or unusual abdominal distention.  She is able to eat without any problems.  Advised to call back if she Has continuing or worsening discomfort.

## 2013-09-25 ENCOUNTER — Encounter: Payer: Self-pay | Admitting: Internal Medicine

## 2013-09-27 ENCOUNTER — Encounter: Payer: Self-pay | Admitting: *Deleted

## 2013-11-13 DIAGNOSIS — M79605 Pain in left leg: Secondary | ICD-10-CM | POA: Insufficient documentation

## 2013-11-14 ENCOUNTER — Ambulatory Visit (HOSPITAL_COMMUNITY)
Admission: RE | Admit: 2013-11-14 | Discharge: 2013-11-14 | Disposition: A | Discharge status: Home / Self Care | Payer: Medicare Other | Source: Ambulatory Visit | Attending: Vascular Surgery | Admitting: Vascular Surgery

## 2013-11-14 ENCOUNTER — Other Ambulatory Visit (HOSPITAL_COMMUNITY): Payer: Self-pay | Admitting: Internal Medicine

## 2013-11-14 DIAGNOSIS — M79609 Pain in unspecified limb: Secondary | ICD-10-CM | POA: Insufficient documentation

## 2014-02-12 ENCOUNTER — Encounter (HOSPITAL_COMMUNITY): Payer: Self-pay | Admitting: Emergency Medicine

## 2014-02-12 ENCOUNTER — Inpatient Hospital Stay (HOSPITAL_COMMUNITY)
Admission: EM | Admit: 2014-02-12 | Discharge: 2014-02-15 | DRG: 872 | Disposition: A | Discharge status: Home / Self Care | Payer: Medicare Other | Attending: Internal Medicine | Admitting: Internal Medicine

## 2014-02-12 ENCOUNTER — Emergency Department (HOSPITAL_COMMUNITY): Payer: Medicare Other

## 2014-02-12 DIAGNOSIS — R140 Abdominal distension (gaseous): Secondary | ICD-10-CM

## 2014-02-12 DIAGNOSIS — N12 Tubulo-interstitial nephritis, not specified as acute or chronic: Secondary | ICD-10-CM

## 2014-02-12 DIAGNOSIS — E669 Obesity, unspecified: Secondary | ICD-10-CM | POA: Diagnosis present

## 2014-02-12 DIAGNOSIS — I2510 Atherosclerotic heart disease of native coronary artery without angina pectoris: Secondary | ICD-10-CM | POA: Diagnosis present

## 2014-02-12 DIAGNOSIS — N390 Urinary tract infection, site not specified: Secondary | ICD-10-CM

## 2014-02-12 DIAGNOSIS — Z7982 Long term (current) use of aspirin: Secondary | ICD-10-CM

## 2014-02-12 DIAGNOSIS — I119 Hypertensive heart disease without heart failure: Secondary | ICD-10-CM

## 2014-02-12 DIAGNOSIS — N10 Acute tubulo-interstitial nephritis: Secondary | ICD-10-CM | POA: Diagnosis present

## 2014-02-12 DIAGNOSIS — Z79899 Other long term (current) drug therapy: Secondary | ICD-10-CM

## 2014-02-12 DIAGNOSIS — I10 Essential (primary) hypertension: Secondary | ICD-10-CM | POA: Diagnosis present

## 2014-02-12 DIAGNOSIS — E119 Type 2 diabetes mellitus without complications: Secondary | ICD-10-CM

## 2014-02-12 DIAGNOSIS — Z951 Presence of aortocoronary bypass graft: Secondary | ICD-10-CM

## 2014-02-12 DIAGNOSIS — F411 Generalized anxiety disorder: Secondary | ICD-10-CM | POA: Diagnosis present

## 2014-02-12 DIAGNOSIS — Z800 Family history of malignant neoplasm of digestive organs: Secondary | ICD-10-CM

## 2014-02-12 DIAGNOSIS — E1165 Type 2 diabetes mellitus with hyperglycemia: Secondary | ICD-10-CM

## 2014-02-12 DIAGNOSIS — K219 Gastro-esophageal reflux disease without esophagitis: Secondary | ICD-10-CM | POA: Diagnosis present

## 2014-02-12 DIAGNOSIS — E785 Hyperlipidemia, unspecified: Secondary | ICD-10-CM | POA: Diagnosis present

## 2014-02-12 DIAGNOSIS — Z6833 Body mass index (BMI) 33.0-33.9, adult: Secondary | ICD-10-CM

## 2014-02-12 DIAGNOSIS — E871 Hypo-osmolality and hyponatremia: Secondary | ICD-10-CM | POA: Diagnosis present

## 2014-02-12 DIAGNOSIS — I200 Unstable angina: Secondary | ICD-10-CM

## 2014-02-12 DIAGNOSIS — R1084 Generalized abdominal pain: Secondary | ICD-10-CM

## 2014-02-12 DIAGNOSIS — A419 Sepsis, unspecified organism: Secondary | ICD-10-CM

## 2014-02-12 DIAGNOSIS — IMO0001 Reserved for inherently not codable concepts without codable children: Secondary | ICD-10-CM | POA: Diagnosis present

## 2014-02-12 LAB — URINALYSIS, ROUTINE W REFLEX MICROSCOPIC
BILIRUBIN URINE: NEGATIVE
Glucose, UA: NEGATIVE mg/dL
Ketones, ur: NEGATIVE mg/dL
Nitrite: NEGATIVE
PROTEIN: NEGATIVE mg/dL
Specific Gravity, Urine: 1.016 (ref 1.005–1.030)
Urobilinogen, UA: 0.2 mg/dL (ref 0.0–1.0)
pH: 5.5 (ref 5.0–8.0)

## 2014-02-12 LAB — CBC WITH DIFFERENTIAL/PLATELET
BASOS ABS: 0 10*3/uL (ref 0.0–0.1)
Basophils Relative: 0 % (ref 0–1)
EOS PCT: 0 % (ref 0–5)
Eosinophils Absolute: 0 10*3/uL (ref 0.0–0.7)
HEMATOCRIT: 42 % (ref 36.0–46.0)
HEMOGLOBIN: 14.5 g/dL (ref 12.0–15.0)
LYMPHS ABS: 1.4 10*3/uL (ref 0.7–4.0)
LYMPHS PCT: 8 % — AB (ref 12–46)
MCH: 31.5 pg (ref 26.0–34.0)
MCHC: 34.5 g/dL (ref 30.0–36.0)
MCV: 91.3 fL (ref 78.0–100.0)
MONO ABS: 1.3 10*3/uL — AB (ref 0.1–1.0)
MONOS PCT: 8 % (ref 3–12)
Neutro Abs: 14 10*3/uL — ABNORMAL HIGH (ref 1.7–7.7)
Neutrophils Relative %: 84 % — ABNORMAL HIGH (ref 43–77)
Platelets: 237 10*3/uL (ref 150–400)
RBC: 4.6 MIL/uL (ref 3.87–5.11)
RDW: 12.6 % (ref 11.5–15.5)
WBC: 16.6 10*3/uL — AB (ref 4.0–10.5)

## 2014-02-12 LAB — COMPREHENSIVE METABOLIC PANEL
ALK PHOS: 61 U/L (ref 39–117)
ALT: 20 U/L (ref 0–35)
AST: 15 U/L (ref 0–37)
Albumin: 4.1 g/dL (ref 3.5–5.2)
BILIRUBIN TOTAL: 1.5 mg/dL — AB (ref 0.3–1.2)
BUN: 15 mg/dL (ref 6–23)
CHLORIDE: 89 meq/L — AB (ref 96–112)
CO2: 27 meq/L (ref 19–32)
CREATININE: 0.61 mg/dL (ref 0.50–1.10)
Calcium: 10.7 mg/dL — ABNORMAL HIGH (ref 8.4–10.5)
GFR calc Af Amer: >90 mL/min (ref >90)
GFR, EST NON AFRICAN AMERICAN: 90 mL/min — AB (ref >90)
Glucose, Bld: 178 mg/dL — ABNORMAL HIGH (ref 70–99)
Potassium: 4 mEq/L (ref 3.7–5.3)
Sodium: 133 mEq/L — ABNORMAL LOW (ref 137–147)
Total Protein: 8 g/dL (ref 6.0–8.3)

## 2014-02-12 LAB — URINE MICROSCOPIC-ADD ON

## 2014-02-12 LAB — CBG MONITORING, ED: Glucose-Capillary: 180 mg/dL — ABNORMAL HIGH (ref 70–99)

## 2014-02-12 LAB — I-STAT CG4 LACTIC ACID, ED: Lactic Acid, Venous: 2.72 mmol/L — ABNORMAL HIGH (ref 0.5–2.2)

## 2014-02-12 LAB — LIPASE, BLOOD: Lipase: 28 U/L (ref 11–59)

## 2014-02-12 MED ORDER — DEXTROSE 5 % IV SOLN
1.0000 g | INTRAVENOUS | Status: DC
  Administered 2014-02-12 – 2014-02-14 (×3): 1 g via INTRAVENOUS
  Filled 2014-02-12 (×3): qty 10

## 2014-02-12 MED ORDER — HEPARIN SODIUM (PORCINE) 5000 UNIT/ML IJ SOLN
5000.0000 [IU] | Freq: Three times a day (TID) | INTRAMUSCULAR | Status: DC
  Filled 2014-02-12 (×9): qty 1

## 2014-02-12 MED ORDER — DEXTROSE 5 % IV SOLN
1.0000 g | Freq: Once | INTRAVENOUS | Status: DC
  Filled 2014-02-12: qty 10

## 2014-02-12 MED ORDER — ASPIRIN 325 MG PO TABS
325.0000 mg | ORAL_TABLET | Freq: Every day | ORAL | Status: DC
  Administered 2014-02-13 – 2014-02-15 (×3): 325 mg via ORAL
  Filled 2014-02-12 (×3): qty 1

## 2014-02-12 MED ORDER — SODIUM CHLORIDE 0.9 % IV SOLN
INTRAVENOUS | Status: DC
  Administered 2014-02-12 – 2014-02-14 (×4): via INTRAVENOUS

## 2014-02-12 MED ORDER — INSULIN ASPART 100 UNIT/ML ~~LOC~~ SOLN: 0.0000 [IU] | Freq: Three times a day (TID) | SUBCUTANEOUS | Status: DC

## 2014-02-12 MED ORDER — SODIUM CHLORIDE 0.9 % IJ SOLN: 3.0000 mL | Freq: Two times a day (BID) | INTRAMUSCULAR | Status: DC

## 2014-02-12 MED ORDER — ONDANSETRON HCL 4 MG/2ML IJ SOLN: 4.0000 mg | Freq: Four times a day (QID) | INTRAMUSCULAR | Status: DC | PRN

## 2014-02-12 MED ORDER — IBUPROFEN 600 MG PO TABS: 600.0000 mg | ORAL_TABLET | Freq: Four times a day (QID) | ORAL | Status: DC | PRN

## 2014-02-12 MED ORDER — SODIUM CHLORIDE 0.9 % IV BOLUS (SEPSIS)
500.0000 mL | Freq: Once | INTRAVENOUS | Status: AC
  Administered 2014-02-12: 500 mL via INTRAVENOUS

## 2014-02-12 MED ORDER — IRBESARTAN 300 MG PO TABS
300.0000 mg | ORAL_TABLET | Freq: Every day | ORAL | Status: DC
  Filled 2014-02-12: qty 1

## 2014-02-12 MED ORDER — METOPROLOL TARTRATE 100 MG PO TABS
100.0000 mg | ORAL_TABLET | Freq: Two times a day (BID) | ORAL | Status: DC
  Filled 2014-02-12 (×2): qty 1

## 2014-02-12 MED ORDER — VITAMIN B-12 1000 MCG PO TABS
1000.0000 ug | ORAL_TABLET | Freq: Every day | ORAL | Status: DC
  Administered 2014-02-13 – 2014-02-15 (×3): 1000 ug via ORAL
  Filled 2014-02-12 (×3): qty 1

## 2014-02-12 MED ORDER — HYDROCHLOROTHIAZIDE 12.5 MG PO CAPS
12.5000 mg | ORAL_CAPSULE | Freq: Every day | ORAL | Status: DC
  Filled 2014-02-12: qty 1

## 2014-02-12 MED ORDER — SPIRONOLACTONE 12.5 MG HALF TABLET
12.5000 mg | ORAL_TABLET | Freq: Every day | ORAL | Status: DC
  Filled 2014-02-12: qty 1

## 2014-02-12 MED ORDER — TELMISARTAN-HCTZ 80-12.5 MG PO TABS: 1.0000 | ORAL_TABLET | Freq: Every day | ORAL | Status: DC

## 2014-02-12 MED ORDER — ONDANSETRON 8 MG PO TBDP
8.0000 mg | ORAL_TABLET | Freq: Once | ORAL | Status: DC
  Filled 2014-02-12: qty 2

## 2014-02-12 NOTE — ED Provider Notes (Signed)
CSN: [id number]     Arrival date & time 02/12/14  1521 History   First MD Initiated Contact with Patient 02/12/14 1821     Chief Complaint  Patient presents with  . Flank Pain     (Consider location/radiation/quality/duration/timing/severity/associated sxs/prior Treatment) Patient is a 68 y.o. female presenting with flank pain. The history is provided by the patient and medical records. No language interpreter was used.  Flank Pain Associated symptoms include abdominal pain, nausea and vomiting. Pertinent negatives include no chest pain, coughing, diaphoresis, fatigue, fever, headaches, neck pain, rash or weakness.    Ruth [name] is a 68 y.o. female  with a hx of CAD, diverticulosis, anxiety, steatohepatitis, GERD, non-insulin-dependent diabetes presents to the Emergency Department complaining of gradual, persistent, progressively worsening dysuria with associated "floppy" urine onset yesterday. Patient reports she had multiple bouts of emesis overnight with worsening left-sided abdominal and left flank pain. She reports she saw her primary care physician recommended that she visit the emergency department. She reports that she had a fever at home, MAXIMUM TEMPERATURE 101.3 F.  patient reports she's continued to have nausea and has been healthy for 2 days. She reports that her primary care provider gave her one tablet of Cipro however she just completed this helped. Nothing seems to make her symptoms better or worse. Pt denies headache neck pain, chest pain, shortness of breath, diarrhea, weakness, dizziness, syncope.     Past Medical History  Diagnosis Date  . CAD (coronary artery disease)     a. CABG x 4 in 2006 in [place]. b. DES to midbody of SVG-PDA/PLA 07/2012.  . Labile hypertension   . Dyslipidemia     Intolerant of statins  . Diverticulosis   . IBS (irritable bowel syndrome)   . Obesity   . Hyperplastic colon polyp   . Fecal incontinence   . Multiple allergies   .  Anxiety   . Obesity   . Complication of anesthesia     "quit breathing when I got ?Inovar" (07/28/2012)  . Steatohepatitis   . Migraines     "had them in my 30's" (07/28/2012)  . GERD (gastroesophageal reflux disease)   . Type II diabetes mellitus     no meds, diet controlled  . Hyperlipidemia    Past Surgical History  Procedure Laterality Date  . Inguinal hernia repair  1970's?    left  . Excisional hemorrhoidectomy  1970's  . Breast lumpectomy      bilateral  . Cardiac catheterization  2006  . Coronary angioplasty with stent placement  07/28/2012    "1; first time for me" (07/28/2012)  . Coronary artery bypass graft  2006    CABG X4  . Abdominal hysterectomy  1970's  . Tonsillectomy and adenoidectomy  ~ 1951   Family History  Problem Relation Age of Onset  . Coronary artery disease Father   . Colon cancer Father   . Colon polyps Father   . Heart disease Father   . Hypertension    . Breast cancer      grandmother  . Diabetes Maternal Grandmother   . Diabetes Paternal Grandmother   . Stroke Mother   . Esophageal cancer Neg Hx   . Rectal cancer Neg Hx   . Stomach cancer Neg Hx    History  Substance Use Topics  . Smoking status: Never Smoker   . Smokeless tobacco: Never Used  . Alcohol Use: No   OB History   Grav Para Term Preterm  Abortions TAB SAB Ect Mult Living                 Review of Systems  Constitutional: Negative for fever, diaphoresis, appetite change, fatigue and unexpected weight change.  HENT: Negative for mouth sores and trouble swallowing.   Eyes: Negative for visual disturbance.  Respiratory: Negative for cough, chest tightness, shortness of breath, wheezing and stridor.   Cardiovascular: Negative for chest pain and palpitations.  Gastrointestinal: Positive for nausea, vomiting and abdominal pain. Negative for diarrhea, constipation, blood in stool, abdominal distention and rectal pain.  Endocrine: Negative for polydipsia, polyphagia and  polyuria.  Genitourinary: Positive for dysuria and flank pain. Negative for urgency, frequency, hematuria and difficulty urinating.  Musculoskeletal: Negative for back pain, neck pain and neck stiffness.  Skin: Negative for rash.  Allergic/Immunologic: Negative for immunocompromised state.  Neurological: Negative for syncope, weakness, light-headedness and headaches.  Hematological: Negative for adenopathy. Does not bruise/bleed easily.  Psychiatric/Behavioral: Negative for confusion and sleep disturbance. The patient is not nervous/anxious.   All other systems reviewed and are negative.     Allergies  Betadine; Codeine; Demerol; Latex; Other; Percocet; Plavix; Red dye; Shrimp; Shrimp flavor; Sulfonamide derivatives; Tylenol; and Iohexol  Home Medications   Prior to Admission medications   Medication Sig Start Date End Date Taking? Authorizing Provider  aspirin 325 MG tablet Take 325 mg by mouth daily.   Yes Historical Provider, MD  calcium carbonate (TUMS - DOSED IN MG ELEMENTAL CALCIUM) 500 MG chewable tablet Chew 1 tablet by mouth 2 (two) times daily as needed. For heartburn   Yes Historical Provider, MD  cholecalciferol (VITAMIN D) 1000 UNITS tablet Take 1,000 Units by mouth daily.   Yes Historical Provider, MD  metoprolol (LOPRESSOR) 50 MG tablet Take 100 mg by mouth 2 (two) times daily.    Yes Historical Provider, MD  nitroGLYCERIN (NITROSTAT) 0.4 MG SL tablet Place 1 tablet (0.4 mg total) under the tongue every 5 (five) minutes as needed for chest pain (up to 3 doses). 07/29/12  Yes Dayna N Dunn, PA-C  spironolactone (ALDACTONE) 25 MG tablet Take 12.5 mg by mouth daily.   Yes Historical Provider, MD  telmisartan-hydrochlorothiazide (MICARDIS HCT) 80-12.5 MG per tablet Take 1 tablet by mouth daily.   Yes Historical Provider, MD  vitamin B-12 (CYANOCOBALAMIN) 1000 MCG tablet Take 1,000 mcg by mouth daily.   Yes Historical Provider, MD   BP 149/65  Pulse 89  Temp(Src) 101.2 F  (38.4 C) (Rectal)  Resp 21  SpO2 95% Physical Exam  Nursing note and vitals reviewed. Constitutional: She is oriented to person, place, and time. She appears well-developed and well-nourished. No distress.  Awake, alert, nontoxic appearance  HENT:  Head: Normocephalic and atraumatic.  Mouth/Throat: Oropharynx is clear and moist. No oropharyngeal exudate.  Eyes: Conjunctivae are normal. No scleral icterus.  Neck: Normal range of motion. Neck supple.  Cardiovascular: Normal rate, regular rhythm, normal heart sounds and intact distal pulses.   Pulmonary/Chest: Effort normal and breath sounds normal. No respiratory distress. She has no wheezes.  Abdominal: Soft. Bowel sounds are normal. She exhibits no distension and no mass. There is tenderness. There is guarding (voluntary). There is no rebound and no CVA tenderness.    Patient with pain to palpation the left upper quadrant left lower quadrant and left side; nonicteric guarding, no rebound, no peritoneal signs No CVA tenderness  Musculoskeletal: Normal range of motion. She exhibits no edema.  Lymphadenopathy:    She has no cervical adenopathy.  Neurological: She is alert and oriented to person, place, and time. She exhibits normal muscle tone. Coordination normal.  Speech is clear and goal oriented Moves extremities without ataxia  Skin: Skin is warm and dry. No rash noted. She is not diaphoretic.  Psychiatric: She has a normal mood and affect.    ED Course  Procedures (including critical care time) Labs Review Labs Reviewed  CBC WITH DIFFERENTIAL - Abnormal; Notable for the following:    WBC 16.6 (*)    Neutrophils Relative % 84 (*)    Neutro Abs 14.0 (*)    Lymphocytes Relative 8 (*)    Monocytes Absolute 1.3 (*)    All other components within normal limits  COMPREHENSIVE METABOLIC PANEL - Abnormal; Notable for the following:    Sodium 133 (*)    Chloride 89 (*)    Glucose, Bld 178 (*)    Calcium 10.7 (*)    Total  Bilirubin 1.5 (*)    GFR calc non Af Amer 90 (*)    All other components within normal limits  URINALYSIS, ROUTINE W REFLEX MICROSCOPIC - Abnormal; Notable for the following:    APPearance CLOUDY (*)    Hgb urine dipstick LARGE (*)    Leukocytes, UA LARGE (*)    All other components within normal limits  URINE MICROSCOPIC-ADD ON - Abnormal; Notable for the following:    Squamous Epithelial / LPF MANY (*)    Bacteria, UA FEW (*)    All other components within normal limits  I-STAT CG4 LACTIC ACID, ED - Abnormal; Notable for the following:    Lactic Acid, Venous 2.72 (*)    All other components within normal limits  CULTURE, BLOOD (ROUTINE X 2)  CULTURE, BLOOD (ROUTINE X 2)  URINE CULTURE  LIPASE, BLOOD    Imaging Review Ct Abdomen Pelvis Wo Contrast  02/12/2014   CLINICAL DATA:  Fever.  Flank pain.  Urinary tract infection.  EXAM: CT ABDOMEN AND PELVIS WITHOUT CONTRAST  TECHNIQUE: Multidetector CT imaging of the abdomen and pelvis was performed following the standard protocol without IV contrast.  COMPARISON:  06/19/2008  FINDINGS: Lung bases show scarring and/or atelectasis. No pleural or pericardial fluid.  The liver has a normal appearance without contrast. No calcified gallstones. The spleen is normal. The pancreas is normal. The adrenal glands are normal. 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 No evidence of hydronephrosis or stranding on this side. On the left, the kidney shows normal size and does not contain any focal lesions. One could question mild stranding of the surrounding fat but this is not definite. No stone seen along the course of either ureter. No stone in the bladder.  There is atherosclerosis of the aorta and its branch vessels but no aneurysm. The IVC is normal. No retroperitoneal mass or adenopathy. No free intraperitoneal fluid or air. The patient has had hysterectomy. There is diverticulosis of the colon without evidence of  diverticulitis. No significant bony finding.  IMPRESSION: No sign of renal obstruction or abscess. One could question mild stranding of the fat around the left kidney that could go along with pyelonephritis.  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   Electronically Signed   By: Nelson Chimes M.D.   On: 02/12/2014 20:23     EKG Interpretation None      MDM   Final diagnoses:  Pyelonephritis   Ruth [name] presents with  primary care office with complaints of urinary tract infection, fevers, vomiting and abdominal pain.  Patient is unable to give urine sample this time.  CBC with leukocytosis.  Normal BUN and creatinine.  Patient without history of kidney stone, no CVA tenderness today. Question possible pyelonephritis versus other intra-abdominal pathology.  Patient allergic to IV and oral contrast, will obtain CT abdomen without.    8:47 PM UA with evidence of urinary tract infection. Lactic acid 2.72. Leukocytosis of 16.6. CT with likely pyelonephritis.  Will admit for IV abx.  IV rocephin given in the ED.    9:01 PM Pt admitted to [facility].  BP 149/65  Pulse 89  Temp(Src) 101.2 F (38.4 C) (Rectal)  Resp 21  SpO2 95%   Abigail Butts, PA-C 02/12/14 2102

## 2014-02-12 NOTE — H&P (Signed)
[facility] History and Physical  Ruth [name] WRU:[id number] DOB: September 16, 1945 DOA: 02/12/2014  Referring physician: EDP PCP: Jerlyn Ly, MD   Chief Complaint: Fever   HPI: Ruth [name] is a 68 y.o. female who went to her PCPs office earlier today with L flank pain.  Diagnosed with UTI, sent home on cipro and told to call if she developed fever.  At home developed fever of 101.3 (101.2 here in ED), as well as persistent N/V.  Review of Systems: Systems reviewed.  As above, otherwise negative  Past Medical History  Diagnosis Date  . CAD (coronary artery disease)     a. CABG x 4 in 2006 in [place]. b. DES to midbody of SVG-PDA/PLA 07/2012.  . Labile hypertension   . Dyslipidemia     Intolerant of statins  . Diverticulosis   . IBS (irritable bowel syndrome)   . Obesity   . Hyperplastic colon polyp   . Fecal incontinence   . Multiple allergies   . Anxiety   . Obesity   . Complication of anesthesia     "quit breathing when I got ?Inovar" (07/28/2012)  . Steatohepatitis   . Migraines     "had them in my 30's" (07/28/2012)  . GERD (gastroesophageal reflux disease)   . Type II diabetes mellitus     no meds, diet controlled  . Hyperlipidemia    Past Surgical History  Procedure Laterality Date  . Inguinal hernia repair  1970's?    left  . Excisional hemorrhoidectomy  1970's  . Breast lumpectomy      bilateral  . Cardiac catheterization  2006  . Coronary angioplasty with stent placement  07/28/2012    "1; first time for me" (07/28/2012)  . Coronary artery bypass graft  2006    CABG X4  . Abdominal hysterectomy  1970's  . Tonsillectomy and adenoidectomy  ~ 1951   Social History:  reports that she has never smoked. She has never used smokeless tobacco. She reports that she does not drink alcohol or use illicit drugs.  Allergies  Allergen Reactions  . Betadine [Povidone Iodine] Swelling  . Codeine Anaphylaxis and Other (See Comments)    "quit  breathing" (07/28/2012)  . Demerol Other (See Comments)    "quit breathing" (07/28/2012)  . Latex Other (See Comments)    "quit breathing" (07/28/2012)  . Other Other (See Comments)    Perfume, Any Fragrance. "quit breathing" (07/28/2012)  . Percocet [Oxycodone-Acetaminophen] Other (See Comments)    "quit breathing; disoriented" (07/28/2012)  . Plavix [Clopidogrel Bisulfate] Other (See Comments)    "get hot; like I'm burning up inside; had to put me in shower after OHS because of that" (07/28/2012)  . Red Dye Other (See Comments)    "quit breathing" (07/28/2012)  . Shrimp Flavor Shortness Of Breath and Other (See Comments)    "broke out in knots all over" (07/28/2012)  . Shrimp [Shellfish Allergy] Shortness Of Breath and Other (See Comments)    "broke out in knots all over" (07/28/2012)  . Sulfonamide Derivatives Rash and Other (See Comments)    "quit breathing" (07/28/2012)  . Tylenol [Acetaminophen] Shortness Of Breath  . Iohexol     Unknown    Family History  Problem Relation Age of Onset  . Coronary artery disease Father   . Colon cancer Father   . Colon polyps Father   . Heart disease Father   . Hypertension    . Breast cancer      grandmother  .  Diabetes Maternal Grandmother   . Diabetes Paternal Grandmother   . Stroke Mother   . Esophageal cancer Neg Hx   . Rectal cancer Neg Hx   . Stomach cancer Neg Hx      Prior to Admission medications   Medication Sig Start Date End Date Taking? Authorizing Provider  aspirin 325 MG tablet Take 325 mg by mouth daily.   Yes Historical Provider, MD  calcium carbonate (TUMS - DOSED IN MG ELEMENTAL CALCIUM) 500 MG chewable tablet Chew 1 tablet by mouth 2 (two) times daily as needed. For heartburn   Yes Historical Provider, MD  cholecalciferol (VITAMIN D) 1000 UNITS tablet Take 1,000 Units by mouth daily.   Yes Historical Provider, MD  metoprolol (LOPRESSOR) 50 MG tablet Take 100 mg by mouth 2 (two) times daily.    Yes Historical  Provider, MD  nitroGLYCERIN (NITROSTAT) 0.4 MG SL tablet Place 1 tablet (0.4 mg total) under the tongue every 5 (five) minutes as needed for chest pain (up to 3 doses). 07/29/12  Yes Dayna N Dunn, PA-C  spironolactone (ALDACTONE) 25 MG tablet Take 12.5 mg by mouth daily.   Yes Historical Provider, MD  telmisartan-hydrochlorothiazide (MICARDIS HCT) 80-12.5 MG per tablet Take 1 tablet by mouth daily.   Yes Historical Provider, MD  vitamin B-12 (CYANOCOBALAMIN) 1000 MCG tablet Take 1,000 mcg by mouth daily.   Yes Historical Provider, MD   Physical Exam: Filed Vitals:   02/12/14 1945  BP: 149/65  Pulse: 89  Temp:   Resp: 21    BP 149/65  Pulse 89  Temp(Src) 101.2 F (38.4 C) (Rectal)  Resp 21  SpO2 95%  General Appearance:    Alert, oriented, no distress, appears stated age  Head:    Normocephalic, atraumatic  Eyes:    PERRL, EOMI, sclera non-icteric        Nose:   Nares without drainage or epistaxis. Mucosa, turbinates normal  Throat:   Moist mucous membranes. Oropharynx without erythema or exudate.  Neck:   Supple. No carotid bruits.  No thyromegaly.  No lymphadenopathy.   Back:     No CVA tenderness, no spinal tenderness  Lungs:     Clear to auscultation bilaterally, without wheezes, rhonchi or rales  Chest wall:    No tenderness to palpitation  Heart:    Regular rate and rhythm without murmurs, gallops, rubs  Abdomen:     Soft, non-tender, nondistended, normal bowel sounds, no organomegaly  Genitalia:    deferred  Rectal:    deferred  Extremities:   No clubbing, cyanosis or edema.  Pulses:   2+ and symmetric all extremities  Skin:   Skin color, texture, turgor normal, no rashes or lesions  Lymph nodes:   Cervical, supraclavicular, and axillary nodes normal  Neurologic:   CNII-XII intact. Normal strength, sensation and reflexes      throughout    Labs on Admission:  Basic Metabolic Panel:  Recent Labs Lab 02/12/14 1633  NA 133*  K 4.0  CL 89*  CO2 27  GLUCOSE 178*   BUN 15  CREATININE 0.61  CALCIUM 10.7*   Liver Function Tests:  Recent Labs Lab 02/12/14 1633  AST 15  ALT 20  ALKPHOS 61  BILITOT 1.5*  PROT 8.0  ALBUMIN 4.1    Recent Labs Lab 02/12/14 1633  LIPASE 28   No results found for this basename: AMMONIA,  in the last 168 hours CBC:  Recent Labs Lab 02/12/14 1633  WBC 16.6*  NEUTROABS  14.0*  HGB 14.5  HCT 42.0  MCV 91.3  PLT 237   Cardiac Enzymes: No results found for this basename: CKTOTAL, CKMB, CKMBINDEX, TROPONINI,  in the last 168 hours  BNP (last 3 results) No results found for this basename: PROBNP,  in the last 8760 hours CBG: No results found for this basename: GLUCAP,  in the last 168 hours  Radiological Exams on Admission: Ct Abdomen Pelvis Wo Contrast  02/12/2014   CLINICAL DATA:  Fever.  Flank pain.  Urinary tract infection.  EXAM: CT ABDOMEN AND PELVIS WITHOUT CONTRAST  TECHNIQUE: Multidetector CT imaging of the abdomen and pelvis was performed following the standard protocol without IV contrast.  COMPARISON:  06/19/2008  FINDINGS: Lung bases show scarring and/or atelectasis. No pleural or pericardial fluid.  The liver has a normal appearance without contrast. No calcified gallstones. The spleen is normal. The pancreas is normal. The adrenal glands are normal. 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 No evidence of hydronephrosis or stranding on this side. On the left, the kidney shows normal size and does not contain any focal lesions. One could question mild stranding of the surrounding fat but this is not definite. No stone seen along the course of either ureter. No stone in the bladder.  There is atherosclerosis of the aorta and its branch vessels but no aneurysm. The IVC is normal. No retroperitoneal mass or adenopathy. No free intraperitoneal fluid or air. The patient has had hysterectomy. There is diverticulosis of the colon without evidence of diverticulitis. No  significant bony finding.  IMPRESSION: No sign of renal obstruction or abscess. One could question mild stranding of the fat around the left kidney that could go along with pyelonephritis.  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   Electronically Signed   By: Nelson Chimes M.D.   On: 02/12/2014 20:23    EKG: Independently reviewed.  Assessment/Plan Active Problems:   Diabetes mellitus   Sepsis secondary to UTI   Acute pyelonephritis   1. Acute L sided pyelonephritis with secondary sepsis - cultures pending, empiric rocephin, IVF, repeat CBC and BMP in AM, does have 16k WBC, zofran for nausea, motrin for fever (allergy to tylenol). 2. DM2 - diet controlled at home, SSI ac/hs while here.    Code Status: Full Code  Family Communication: No family in room Disposition Plan: Admit to inpatient   Time spent: 40 min  [facility] Pager [phone]  If 7AM-7PM, please contact the day team taking care of the patient [email or web address] Password [facility] 02/12/2014, 8:56 PM

## 2014-02-12 NOTE — ED Notes (Signed)
CBG is 180. Notified Nurse Kandee Keen.

## 2014-02-12 NOTE — ED Notes (Signed)
Dr. Hurley Cisco at bedside.

## 2014-02-12 NOTE — ED Notes (Signed)
Per EMS- pt is from home. Pt has PCP appt today and had been diagnosed with kidney infection. Pt was sent home with antibiotics and was told to call if she developed fever. Pt states that that fever was 101.3. Pt has left flank pain. Pt has nausea and dry heaves.

## 2014-02-13 DIAGNOSIS — E871 Hypo-osmolality and hyponatremia: Secondary | ICD-10-CM

## 2014-02-13 DIAGNOSIS — I10 Essential (primary) hypertension: Secondary | ICD-10-CM

## 2014-02-13 LAB — CBC
HEMATOCRIT: 40.1 % (ref 36.0–46.0)
Hemoglobin: 13.7 g/dL (ref 12.0–15.0)
MCH: 31.3 pg (ref 26.0–34.0)
MCHC: 34.2 g/dL (ref 30.0–36.0)
MCV: 91.6 fL (ref 78.0–100.0)
Platelets: 209 10*3/uL (ref 150–400)
RBC: 4.38 MIL/uL (ref 3.87–5.11)
RDW: 12.5 % (ref 11.5–15.5)
WBC: 15.9 10*3/uL — ABNORMAL HIGH (ref 4.0–10.5)

## 2014-02-13 LAB — LACTIC ACID, PLASMA: Lactic Acid, Venous: 1.8 mmol/L (ref 0.5–2.2)

## 2014-02-13 LAB — URINE CULTURE
Colony Count: NO GROWTH
Culture: NO GROWTH

## 2014-02-13 LAB — GLUCOSE, CAPILLARY
GLUCOSE-CAPILLARY: 176 mg/dL — AB (ref 70–99)
GLUCOSE-CAPILLARY: 135 mg/dL — AB (ref 70–99)
GLUCOSE-CAPILLARY: 182 mg/dL — AB (ref 70–99)
Glucose-Capillary: 140 mg/dL — ABNORMAL HIGH (ref 70–99)

## 2014-02-13 LAB — BASIC METABOLIC PANEL
BUN: 11 mg/dL (ref 6–23)
CO2: 24 mEq/L (ref 19–32)
Calcium: 9.5 mg/dL (ref 8.4–10.5)
Chloride: 92 mEq/L — ABNORMAL LOW (ref 96–112)
Creatinine, Ser: 0.58 mg/dL (ref 0.50–1.10)
Glucose, Bld: 203 mg/dL — ABNORMAL HIGH (ref 70–99)
Potassium: 3.8 mEq/L (ref 3.7–5.3)
SODIUM: 133 meq/L — AB (ref 137–147)

## 2014-02-13 LAB — HEMOGLOBIN A1C
Hgb A1c MFr Bld: 8 % — ABNORMAL HIGH (ref <5.7)
Mean Plasma Glucose: 183 mg/dL — ABNORMAL HIGH (ref <117)

## 2014-02-13 MED ORDER — METOPROLOL TARTRATE 50 MG PO TABS
50.0000 mg | ORAL_TABLET | Freq: Two times a day (BID) | ORAL | Status: DC
  Administered 2014-02-13 – 2014-02-15 (×4): 50 mg via ORAL
  Filled 2014-02-13 (×5): qty 1

## 2014-02-13 MED ORDER — ALUM & MAG HYDROXIDE-SIMETH 200-200-20 MG/5ML PO SUSP
15.0000 mL | ORAL | Status: DC | PRN
  Administered 2014-02-13: 15 mL via ORAL
  Filled 2014-02-13: qty 30

## 2014-02-13 NOTE — Progress Notes (Signed)
OT Cancellation Note  Patient Details Name: Ruth [name] MRN: [id number] DOB: Jul 05, 1946   Cancelled Treatment:    Reason Eval/Treat Not Completed: OT screened, no needs identified, will sign off. Spoke with pt and she has been up in her room and has no OT needs at this time.  [name] OTR/L [phone] 02/13/2014, 3:34 PM

## 2014-02-13 NOTE — Evaluation (Signed)
Physical Therapy Evaluation and Discharge Patient Details Name: Ruth [name] MRN: [id number] DOB: 1945/12/25 Today's Date: 02/13/2014   History of Present Illness  Ruth [name] is a 68 y.o. female who went to her PCPs office earlier today with L flank pain.  Diagnosed with UTI, sent home on cipro and told to call if she developed fever.  At home developed fever of 101.3 (101.2 here in ED), as well as persistent N/V.  Clinical Impression  Pt functioning at baseline. Pt at minimal falls risk as demod by score of 23/24 on DGI. Pt safe to d/c home once medically stable. Pt with no further acute PT needs at this time. PT signing off. Please re-consult if needed in future.    Follow Up Recommendations No PT follow up    Equipment Recommendations  None recommended by PT    Recommendations for Other Services       Precautions / Restrictions Precautions Precautions: None Restrictions Weight Bearing Restrictions: No      Mobility  Bed Mobility Overal bed mobility:  (pt up in chair upon PT arrival)                Transfers Overall transfer level: Independent Equipment used: None             General transfer comment: safe technique, v/c's for safe IV line management  Ambulation/Gait Ambulation/Gait assistance: Independent Ambulation Distance (Feet): 400 Feet Assistive device: None Gait Pattern/deviations: WFL(Within Functional Limits)   Gait velocity interpretation: at or above normal speed for age/gender General Gait Details: pt with no episodes of LOB  Stairs Stairs: Yes Stairs assistance: Modified independent (Device/Increase time) Stair Management: One rail Right Number of Stairs: 3 General stair comments: pt step to down steps but safe  Wheelchair Mobility    Modified Rankin (Stroke Patients Only)       Balance                             High level balance activites: Backward walking;Direction changes;Turns High Level Balance  Comments: pt able to amb backwards and side step with supervision. pt required minA to maintain balance during tandem ambulation Standardized Balance Assessment Standardized Balance Assessment : Dynamic Gait Index   Dynamic Gait Index Level Surface: Normal Change in Gait Speed: Normal Gait with Horizontal Head Turns: Normal Gait with Vertical Head Turns: Normal Gait and Pivot Turn: Normal Step Over Obstacle: Normal Step Around Obstacles: Normal Steps: Mild Impairment Total Score: 23       Pertinent Vitals/Pain Denies pain, denied dizziness during eval and all testing    Home Living Family/patient expects to be discharged to:: Private residence Living Arrangements:  (pt is primary caregiver to her parents) Available Help at Discharge: Family;Friend(s);Available PRN/intermittently Type of Home: House Home Access: Stairs to enter Entrance Stairs-Rails: Can reach both Entrance Stairs-Number of Steps: 3 Home Layout: One level Home Equipment: None (parents have DME) Additional Comments: pt primary caregiver to her parents who are 38 and 52    Prior Function Level of Independence: Independent               Hand Dominance   Dominant Hand: Right    Extremity/Trunk Assessment   Upper Extremity Assessment: Overall WFL for tasks assessed           Lower Extremity Assessment: Overall WFL for tasks assessed      Cervical / Trunk Assessment: Normal  Communication   Communication:  No difficulties  Cognition Arousal/Alertness: Awake/alert Behavior During Therapy: WFL for tasks assessed/performed Overall Cognitive Status: Within Functional Limits for tasks assessed                      General Comments      Exercises        Assessment/Plan    PT Assessment Patent does not need any further PT services  PT Diagnosis     PT Problem List    PT Treatment Interventions     PT Goals (Current goals can be found in the Care Plan section) Acute Rehab PT  Goals Patient Stated Goal: home ASAP to care for parents PT Goal Formulation: No goals set, d/c therapy    Frequency     Barriers to discharge        Co-evaluation               End of Session Equipment Utilized During Treatment: Gait belt Activity Tolerance: Patient tolerated treatment well Patient left: in chair;with call bell/phone within reach Nurse Communication: Mobility status         Time: 4997-1820 PT Time Calculation (min): 17 min   Charges:   PT Evaluation $Initial PT Evaluation Tier I: 1 Procedure PT Treatments $Gait Training: 8-22 mins   PT G CodesKoleen Distance Andrus Sharp 02/13/2014, 4:49 PM  Kittie Plater, PT, DPT Pager #: [phone] Office #: [phone]

## 2014-02-13 NOTE — Progress Notes (Signed)
[facility] PROGRESS NOTE  KIVA NORLAND KCL:[id number] DOB: 1946/07/03 DOA: 02/12/2014  PCP: Jerlyn Ly, MD  Brief HPI: Ruth [name] is a 68 y.o. female who went to her PCPs office on day of admission with L flank pain. She was diagnosed with UTI, sent home on cipro and told to call if she developed fever. At home developed fever of 101.3 (101.2 here in ED), as well as persistent N/V.  Past medical history:  Past Medical History  Diagnosis Date  . CAD (coronary artery disease)     a. CABG x 4 in 2006 in [place]. b. DES to midbody of SVG-PDA/PLA 07/2012.  . Labile hypertension   . Dyslipidemia     Intolerant of statins  . Diverticulosis   . IBS (irritable bowel syndrome)   . Obesity   . Hyperplastic colon polyp   . Fecal incontinence   . Multiple allergies   . Anxiety   . Obesity   . Complication of anesthesia     "quit breathing when I got ?[place]" (07/28/2012)  . Steatohepatitis   . Migraines     "had them in my 30's" (07/28/2012)  . GERD (gastroesophageal reflux disease)   . Type II diabetes mellitus     no meds, diet controlled  . Hyperlipidemia     Consultants: None  Procedures: None  Antibiotics: Ceftriaxone  Subjective: Patient feels slightly better. Left sided abdominal and flank pain persists but down to 4/10. No nausea and vomiting this morning. Some dizziness earlier today. Complains of left great toe ingrown nail.  Objective: Vital Signs  Filed Vitals:   02/12/14 2200 02/12/14 2222 02/12/14 2250 02/13/14 0645  BP:   147/62 116/54  Pulse: 77  82 88  Temp:  99.5 F (37.5 C)  98.8 F (37.1 C)  TempSrc:  Oral  Oral  Resp:   18 18  Height:   5' 3.5" (1.613 m)   Weight:   85.594 kg (188 lb 11.2 oz)   SpO2: 96%  96% 96%    Intake/Output Summary (Last 24 hours) at 02/13/14 0817 Last data filed at 02/13/14 0600  Gross per 24 hour  Intake    600 ml  Output    300 ml  Net    300 ml   Filed Weights   02/12/14 2250    Weight: 85.594 kg (188 lb 11.2 oz)    General appearance: alert, cooperative, appears stated age, no distress and moderately obese Throat: dry mm Resp: clear to auscultation bilaterally Cardio: regular rate and rhythm, S1, S2 normal, no murmur, click, rub or gallop GI: soft, tender in left side of abormen without rebound rigisidty or guarding. BS present. No masses or organomegaly. Neurologic: No focal deficits Left 1st Toe: ingrown toe nail. Mild erythema. No tenderness  Lab Results:  Basic Metabolic Panel:  Recent Labs Lab 02/12/14 1633 02/13/14 0015  NA 133* 133*  K 4.0 3.8  CL 89* 92*  CO2 27 24  GLUCOSE 178* 203*  BUN 15 11  CREATININE 0.61 0.58  CALCIUM 10.7* 9.5   Liver Function Tests:  Recent Labs Lab 02/12/14 1633  AST 15  ALT 20  ALKPHOS 61  BILITOT 1.5*  PROT 8.0  ALBUMIN 4.1    Recent Labs Lab 02/12/14 1633  LIPASE 28   CBC:  Recent Labs Lab 02/12/14 1633 02/13/14 0015  WBC 16.6* 15.9*  NEUTROABS 14.0*  --   HGB 14.5 13.7  HCT 42.0 40.1  MCV 91.3 91.6  PLT 237 209   CBG:  Recent Labs Lab 02/12/14 2159 02/13/14 0744  GLUCAP 180* 176*    Recent Results (from the past 240 hour(s))  CULTURE, BLOOD (ROUTINE X 2)     Status: None   Collection Time    02/13/14 12:15 AM      Result Value Ref Range Status   Specimen Description BLOOD RIGHT ARM   Final   Special Requests BAA 5CC BLUE 3CC RED   Final   Culture PENDING   Incomplete   Report Status PENDING   Incomplete      Studies/Results: Ct Abdomen Pelvis Wo Contrast  02/12/2014   CLINICAL DATA:  Fever.  Flank pain.  Urinary tract infection.  EXAM: CT ABDOMEN AND PELVIS WITHOUT CONTRAST  TECHNIQUE: Multidetector CT imaging of the abdomen and pelvis was performed following the standard protocol without IV contrast.  COMPARISON:  06/19/2008  FINDINGS: Lung bases show scarring and/or atelectasis. No pleural or pericardial fluid.  The liver has a normal appearance without contrast. No  calcified gallstones. The spleen is normal. The pancreas is normal. The adrenal glands are normal. 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 No evidence of hydronephrosis or stranding on this side. On the left, the kidney shows normal size and does not contain any focal lesions. One could question mild stranding of the surrounding fat but this is not definite. No stone seen along the course of either ureter. No stone in the bladder.  There is atherosclerosis of the aorta and its branch vessels but no aneurysm. The IVC is normal. No retroperitoneal mass or adenopathy. No free intraperitoneal fluid or air. The patient has had hysterectomy. There is diverticulosis of the colon without evidence of diverticulitis. No significant bony finding.  IMPRESSION: No sign of renal obstruction or abscess. One could question mild stranding of the fat around the left kidney that could go along with pyelonephritis.  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   Electronically Signed   By: Nelson Chimes M.D.   On: 02/12/2014 20:23    Medications:  Scheduled: . aspirin  325 mg Oral Daily  . cefTRIAXone (ROCEPHIN) IVPB 1 gram/50 mL D5W  1 g Intravenous Q24H  . heparin  5,000 Units Subcutaneous 3 times per day  . insulin aspart  0-15 Units Subcutaneous TID WC  . metoprolol  50 mg Oral BID  . ondansetron  8 mg Oral Once  . sodium chloride  3 mL Intravenous Q12H  . vitamin B-12  1,000 mcg Oral Daily   Continuous: . sodium chloride 75 mL/hr at 02/12/14 2311   OVZ:CHYIFOYDX, ondansetron (ZOFRAN) IV  Assessment/Plan:  Principal Problem:   Acute pyelonephritis Active Problems:   Coronary artery disease   Diabetes mellitus   Sepsis secondary to UTI   Hyponatremia   HTN (hypertension)    Acute Pyelonephritis Continue Ceftriaxone. Follow urine culture. Anti-emetics and pain medications as needed.  Sepsis Repeat lactic acid level. BP  is soft. Stop her diuretics and ARB. Reduce dose of BB and give holding parameters. Continue IVF. Monitor WBC.  Mild Hyponatremia Likely due to hypovolemia/sepsis. Continue NS.  History of CAD Stable. Continue ASA and BB at reduced dose and with holding parameters. Patient took her own metoprolol this AM. She was asked not to take any of her home medications without talking to RN or MD.  History of Hypertension BP usually runs high. BP is soft  by comparison. Will hold her diuretics and ARB as discussed earlier. Monitor BP closely.  History of Diet Controlled Diabetes Mellitus Type 2 Continue SSI. Check HBA1c.  Code Status: Full Code  DVT Prophylaxis: Heparin    Family Communication: Discussed with patient  Disposition Plan: Not ready for discharge. Will return home when improved. Mobilize as tolerated. PT/OT.    LOS: 1 day   [facility] Pager [phone] 02/13/2014, 8:17 AM  If 8PM-8AM, please contact night-coverage at [email or web address], password [email or web address]   Disclaimer: This note was dictated with voice recognition software. Similar sounding words can inadvertently be transcribed and may not be corrected upon review.

## 2014-02-14 DIAGNOSIS — E1165 Type 2 diabetes mellitus with hyperglycemia: Secondary | ICD-10-CM

## 2014-02-14 DIAGNOSIS — I2510 Atherosclerotic heart disease of native coronary artery without angina pectoris: Secondary | ICD-10-CM

## 2014-02-14 DIAGNOSIS — IMO0001 Reserved for inherently not codable concepts without codable children: Secondary | ICD-10-CM

## 2014-02-14 DIAGNOSIS — N12 Tubulo-interstitial nephritis, not specified as acute or chronic: Secondary | ICD-10-CM

## 2014-02-14 LAB — CBC
HCT: 39.6 % (ref 36.0–46.0)
Hemoglobin: 13 g/dL (ref 12.0–15.0)
MCH: 31.1 pg (ref 26.0–34.0)
MCHC: 32.8 g/dL (ref 30.0–36.0)
MCV: 94.7 fL (ref 78.0–100.0)
PLATELETS: 181 10*3/uL (ref 150–400)
RBC: 4.18 MIL/uL (ref 3.87–5.11)
RDW: 12.7 % (ref 11.5–15.5)
WBC: 8.9 10*3/uL (ref 4.0–10.5)

## 2014-02-14 LAB — BASIC METABOLIC PANEL
BUN: 15 mg/dL (ref 6–23)
CALCIUM: 8.9 mg/dL (ref 8.4–10.5)
CHLORIDE: 103 meq/L (ref 96–112)
CO2: 25 meq/L (ref 19–32)
Creatinine, Ser: 0.59 mg/dL (ref 0.50–1.10)
GFR calc Af Amer: >90 mL/min (ref >90)
GFR calc non Af Amer: >90 mL/min (ref >90)
Glucose, Bld: 178 mg/dL — ABNORMAL HIGH (ref 70–99)
POTASSIUM: 4.6 meq/L (ref 3.7–5.3)
Sodium: 142 mEq/L (ref 137–147)

## 2014-02-14 LAB — GLUCOSE, CAPILLARY
GLUCOSE-CAPILLARY: 159 mg/dL — AB (ref 70–99)
GLUCOSE-CAPILLARY: 112 mg/dL — AB (ref 70–99)
GLUCOSE-CAPILLARY: 128 mg/dL — AB (ref 70–99)
Glucose-Capillary: 151 mg/dL — ABNORMAL HIGH (ref 70–99)

## 2014-02-14 MED ORDER — DIPHENHYDRAMINE HCL 50 MG/ML IJ SOLN: 25.0000 mg | Freq: Once | INTRAMUSCULAR | Status: DC

## 2014-02-14 NOTE — Progress Notes (Addendum)
Inpatient Diabetes Program Recommendations  AACE/ADA: New Consensus Statement on Inpatient Glycemic Control (2013)  Target Ranges:  Prepandial:   less than 140 mg/dL      Peak postprandial:   less than 180 mg/dL (1-2 hours)      Critically ill patients:  140 - 180 mg/dL   Reason for Assessment:  Results for Ruth [name], Ruth [name] (MRN [id number]) as of 02/14/2014 10:28  Ref. Range 02/13/2014 12:14 02/13/2014 17:21 02/13/2014 22:52 02/14/2014 04:32 02/14/2014 08:24  Glucose-Capillary Latest Range: 70-99 mg/dL 140 (H) 135 (H) 182 (H)  159 (H)  Results for Ruth [name], Ruth [name] (MRN [id number]) as of 02/14/2014 10:28  Ref. Range 02/13/2014 08:47  Hemoglobin A1C Latest Range: <5.7 % 8.0 (H)   Diabetes history: Type 2 diabetes- "Diet Controlled" Outpatient Diabetes medications: None Current orders for Inpatient glycemic control: Novolog moderate tid with meals   Note that patient has been refusing Novolog correction.  Based on A1C, it appears that she would benefit from the addition of oral diabetes medications at discharge.  She will also need close follow-up with her PCP.  Will follow.  Adah Perl, RN, BC-ADM Inpatient Diabetes Coordinator Pager [phone]     Addendum: Spoke to patient regarding home diabetes management.  She states that she see's Dr. Joylene Draft and that 1 month ago her A1C was 8.5%.  She is pleased that her A1C is improving and states she has been trying to eat better and exercise.  She reports that she is very sensitive to the chemicals in medications and therefore is trying to avoid medications for her diabetes.  She plans to follow-up with Dr. Joylene Draft about her diabetes.  Gave her educational sheet regarding A1C and other standards of care for diabetes. She is under a lot of stress with caring for her elderly parents.  Discussed the importance of managing stress and the effects of stress on her diabetes.

## 2014-02-14 NOTE — Progress Notes (Signed)
PROGRESS NOTE  Ruth [name] FHL:[id number] DOB: 31-Dec-1945 DOA: 02/12/2014 PCP: Jerlyn Ly, MD  Assessment/Plan: Acute Pyelonephritis  -Continue Ceftriaxone.  -WBC improving, blood pressure stable -Follow urine culture--remains negative to date -Anti-emetics and pain medications as needed. -Blood cultures negative to date  Sepsis  Repeat lactic acid level--1.8.  -BP is soft-->improved.  -Stop her diuretics and ARB -Continue metoprolol tartrate 50 mg twice a day Mild Hyponatremia  -Likely due to hypovolemia/sepsis.  -Continue NS.  -also partly due to HCTZ and spironolactone History of CAD  Stable.  -Continue ASA and BB at reduced dose and with holding parameters.  -Patient took her own metoprolol am6/8. She was asked not to take any of her home medications without talking to RN or MD.  History of Hypertension  -BP usually runs high. BP is soft by comparison. -hold her diuretics and ARB as discussed earlier. Monitor BP closely.  History of Diet Controlled Diabetes Mellitus Type 2  -Hemoglobin A1c 8.0 -Patient refuses insulin -She refuses any oral hypoglycemics -She insists on following up with her PCP to negotiate any further need of treatment   Family Communication:   Pt at beside Disposition Plan:   Home  02/15/14 if stable     Procedures/Studies: Ct Abdomen Pelvis Wo Contrast  02/12/2014   CLINICAL DATA:  Fever.  Flank pain.  Urinary tract infection.  EXAM: CT ABDOMEN AND PELVIS WITHOUT CONTRAST  TECHNIQUE: Multidetector CT imaging of the abdomen and pelvis was performed following the standard protocol without IV contrast.  COMPARISON:  06/19/2008  FINDINGS: Lung bases show scarring and/or atelectasis. No pleural or pericardial fluid.  The liver has a normal appearance without contrast. No calcified gallstones. The spleen is normal. The pancreas is normal. The adrenal glands are normal. The right kidney is normal except for a a 1.5 cm angiomyolipoma at the  lower pole. No evidence of hydronephrosis or stranding on this side. On the left, the kidney shows normal size and does not contain any focal lesions. One could question mild stranding of the surrounding fat but this is not definite. No stone seen along the course of either ureter. No stone in the bladder.  There is atherosclerosis of the aorta and its branch vessels but no aneurysm. The IVC is normal. No retroperitoneal mass or adenopathy. No free intraperitoneal fluid or air. The patient has had hysterectomy. There is diverticulosis of the colon without evidence of diverticulitis. No significant bony finding.  IMPRESSION: No sign of renal obstruction or abscess. One could question mild stranding of the fat around the left kidney that could go along with pyelonephritis.  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   Electronically Signed   By: Nelson Chimes M.D.   On: 02/12/2014 20:23         Subjective: Patient denies fevers, chills, headache, chest pain, dyspnea, nausea, vomiting, diarrhea, abdominal pain, dysuria, hematuria   Objective: Filed Vitals:   02/13/14 2152 02/14/14 0600 02/14/14 0939 02/14/14 1319  BP: 139/62 124/57 139/67 134/84  Pulse: 71 72 85 71  Temp: 98.7 F (37.1 C) 98.5 F (36.9 C)  98.2 F (36.8 C)  TempSrc: Oral Oral  Oral  Resp: 16 16  18   Height:      Weight:      SpO2: 96% 95%  93%    Intake/Output Summary (Last 24 hours) at 02/14/14 1508 Last data filed at 02/14/14 1442  Gross per  24 hour  Intake    960 ml  Output   1250 ml  Net   -290 ml   Weight change:  Exam:   General:  Pt is alert, follows commands appropriately, not in acute distress  HEENT: No icterus, No thrush,  [place]/AT  Cardiovascular: RRR, S1/S2, no rubs, no gallops  Respiratory: CTA bilaterally, no wheezing, no crackles, no rhonchi  Abdomen: Soft/+BS, non tender, non distended, no guarding  Extremities: No edema, No lymphangitis, No petechiae,  No rashes, no synovitis  Data Reviewed: Basic Metabolic Panel:  Recent Labs Lab 02/12/14 1633 02/13/14 0015 02/14/14 0432  NA 133* 133* 142  K 4.0 3.8 4.6  CL 89* 92* 103  CO2 27 24 25   GLUCOSE 178* 203* 178*  BUN 15 11 15   CREATININE 0.61 0.58 0.59  CALCIUM 10.7* 9.5 8.9   Liver Function Tests:  Recent Labs Lab 02/12/14 1633  AST 15  ALT 20  ALKPHOS 61  BILITOT 1.5*  PROT 8.0  ALBUMIN 4.1    Recent Labs Lab 02/12/14 1633  LIPASE 28   No results found for this basename: AMMONIA,  in the last 168 hours CBC:  Recent Labs Lab 02/12/14 1633 02/13/14 0015 02/14/14 0432  WBC 16.6* 15.9* 8.9  NEUTROABS 14.0*  --   --   HGB 14.5 13.7 13.0  HCT 42.0 40.1 39.6  MCV 91.3 91.6 94.7  PLT 237 209 181   Cardiac Enzymes: No results found for this basename: CKTOTAL, CKMB, CKMBINDEX, TROPONINI,  in the last 168 hours BNP: No components found with this basename: POCBNP,  CBG:  Recent Labs Lab 02/13/14 1214 02/13/14 1721 02/13/14 2252 02/14/14 0824 02/14/14 1207  GLUCAP 140* 135* 182* 159* 151*    Recent Results (from the past 240 hour(s))  CULTURE, BLOOD (ROUTINE X 2)     Status: None   Collection Time    02/12/14  6:50 PM      Result Value Ref Range Status   Specimen Description BLOOD ARM LEFT   Final   Special Requests BOTTLES DRAWN AEROBIC AND ANAEROBIC 10CC   Final   Culture  Setup Time     Final   Value: 02/13/2014 02:29     Performed at [facility]   Culture     Final   Value:        BLOOD CULTURE RECEIVED NO GROWTH TO DATE CULTURE WILL BE HELD FOR 5 DAYS BEFORE ISSUING A FINAL NEGATIVE REPORT     Performed at [facility]   Report Status PENDING   Incomplete  URINE CULTURE     Status: None   Collection Time    02/12/14  7:45 PM      Result Value Ref Range Status   Specimen Description URINE, CLEAN CATCH   Final   Special Requests NONE   Final   Culture  Setup Time     Final   Value: 02/12/2014 20:16     Performed at  [place]     Final   Value: NO GROWTH     Performed at [facility]   Culture     Final   Value: NO GROWTH     Performed at [facility]   Report Status 02/13/2014 FINAL   Final  CULTURE, BLOOD (ROUTINE X 2)     Status: None   Collection Time    02/13/14 12:15 AM      Result Value Ref  Range Status   Specimen Description BLOOD RIGHT ARM   Final   Special Requests BAA 5CC BLUE 3CC RED   Final   Culture  Setup Time     Final   Value: 02/13/2014 03:50     Performed at [facility]   Culture     Final   Value:        BLOOD CULTURE RECEIVED NO GROWTH TO DATE CULTURE WILL BE HELD FOR 5 DAYS BEFORE ISSUING A FINAL NEGATIVE REPORT     Performed at [facility]   Report Status PENDING   Incomplete     Scheduled Meds: . aspirin  325 mg Oral Daily  . cefTRIAXone (ROCEPHIN) IVPB 1 gram/50 mL D5W  1 g Intravenous Q24H  . heparin  5,000 Units Subcutaneous 3 times per day  . insulin aspart  0-15 Units Subcutaneous TID WC  . metoprolol  50 mg Oral BID  . ondansetron  8 mg Oral Once  . sodium chloride  3 mL Intravenous Q12H  . vitamin B-12  1,000 mcg Oral Daily   Continuous Infusions: . sodium chloride 75 mL/hr at 02/14/14 0200     Meiah Zamudio, DO  [facility] Pager [phone]  If 7PM-7AM, please contact night-coverage [email or web address] Password [email or web address] 02/14/2014, 3:08 PM   LOS: 2 days

## 2014-02-15 DIAGNOSIS — E785 Hyperlipidemia, unspecified: Secondary | ICD-10-CM

## 2014-02-15 LAB — BASIC METABOLIC PANEL
BUN: 11 mg/dL (ref 6–23)
CHLORIDE: 102 meq/L (ref 96–112)
CO2: 24 mEq/L (ref 19–32)
Calcium: 8.7 mg/dL (ref 8.4–10.5)
Creatinine, Ser: 0.57 mg/dL (ref 0.50–1.10)
GFR calc non Af Amer: >90 mL/min (ref >90)
Glucose, Bld: 159 mg/dL — ABNORMAL HIGH (ref 70–99)
POTASSIUM: 4.6 meq/L (ref 3.7–5.3)
SODIUM: 140 meq/L (ref 137–147)

## 2014-02-15 LAB — CBC
HEMATOCRIT: 38.4 % (ref 36.0–46.0)
HEMOGLOBIN: 12.6 g/dL (ref 12.0–15.0)
MCH: 30.7 pg (ref 26.0–34.0)
MCHC: 32.8 g/dL (ref 30.0–36.0)
MCV: 93.7 fL (ref 78.0–100.0)
Platelets: 210 10*3/uL (ref 150–400)
RBC: 4.1 MIL/uL (ref 3.87–5.11)
RDW: 12.5 % (ref 11.5–15.5)
WBC: 8.2 10*3/uL (ref 4.0–10.5)

## 2014-02-15 LAB — GLUCOSE, CAPILLARY: GLUCOSE-CAPILLARY: 168 mg/dL — AB (ref 70–99)

## 2014-02-15 MED ORDER — HYDROCHLOROTHIAZIDE 10 MG/ML ORAL SUSPENSION
6.2500 mg | Freq: Every day | ORAL | Status: DC
  Filled 2014-02-15: qty 1.25

## 2014-02-15 MED ORDER — CEFPODOXIME PROXETIL 200 MG PO TABS
200.0000 mg | ORAL_TABLET | Freq: Two times a day (BID) | ORAL | Status: DC
  Administered 2014-02-15: 200 mg via ORAL
  Filled 2014-02-15 (×2): qty 1

## 2014-02-15 MED ORDER — METOPROLOL TARTRATE 50 MG PO TABS: 50.0000 mg | ORAL_TABLET | Freq: Two times a day (BID) | ORAL | Status: DC

## 2014-02-15 MED ORDER — HYDROCHLOROTHIAZIDE 12.5 MG PO CAPS
12.5000 mg | ORAL_CAPSULE | Freq: Every day | ORAL | Status: DC
  Administered 2014-02-15: 12.5 mg via ORAL

## 2014-02-15 MED ORDER — HYDROCHLOROTHIAZIDE 12.5 MG PO CAPS
12.5000 mg | ORAL_CAPSULE | Freq: Every day | ORAL | Status: DC
Start: 2014-02-15 — End: 2015-03-04

## 2014-02-15 MED ORDER — CEFPODOXIME PROXETIL 200 MG PO TABS: 200.0000 mg | ORAL_TABLET | Freq: Two times a day (BID) | ORAL | Status: DC

## 2014-02-15 NOTE — ED Provider Notes (Signed)
Medical screening examination/treatment/procedure(s) were conducted as a shared visit with non-physician practitioner(s) and myself.  I personally evaluated the patient during the encounter.   EKG Interpretation None      Patient seen by me. Hx of UTI, complaint of left flank pain with nausea, vomiting, and fever. UA cw with UTI, CT scan raises concern for pyelonephritis. Some elevation in lactic acid but less than 4. Will require antibiotics and admission. PE with left sided tenderness.    Results for orders placed during the hospital encounter of 02/12/14  CULTURE, BLOOD (ROUTINE X 2)      Result Value Ref Range   Specimen Description BLOOD ARM LEFT     Special Requests BOTTLES DRAWN AEROBIC AND ANAEROBIC 10CC     Culture  Setup Time       Value: 02/13/2014 02:29     Performed at [facility]   Culture       Value:        BLOOD CULTURE RECEIVED NO GROWTH TO DATE CULTURE WILL BE HELD FOR 5 DAYS BEFORE ISSUING A FINAL NEGATIVE REPORT     Performed at [facility]   Report Status PENDING    CULTURE, BLOOD (ROUTINE X 2)      Result Value Ref Range   Specimen Description BLOOD RIGHT ARM     Special Requests BAA 5CC BLUE 3CC RED     Culture  Setup Time       Value: 02/13/2014 03:50     Performed at [facility]   Culture       Value:        BLOOD CULTURE RECEIVED NO GROWTH TO DATE CULTURE WILL BE HELD FOR 5 DAYS BEFORE ISSUING A FINAL NEGATIVE REPORT     Performed at [facility]   Report Status PENDING    URINE CULTURE      Result Value Ref Range   Specimen Description URINE, CLEAN CATCH     Special Requests NONE     Culture  Setup Time       Value: 02/12/2014 20:16     Performed at [facility]       Value: NO GROWTH     Performed at [facility]   Culture       Value: NO GROWTH     Performed at [facility]   Report Status 02/13/2014 FINAL    CBC WITH DIFFERENTIAL      Result Value Ref Range   WBC  16.6 (*) 4.0 - 10.5 K/uL   RBC 4.60  3.87 - 5.11 MIL/uL   Hemoglobin 14.5  12.0 - 15.0 g/dL   HCT 42.0  36.0 - 46.0 %   MCV 91.3  78.0 - 100.0 fL   MCH 31.5  26.0 - 34.0 pg   MCHC 34.5  30.0 - 36.0 g/dL   RDW 12.6  11.5 - 15.5 %   Platelets 237  150 - 400 K/uL   Neutrophils Relative % 84 (*) 43 - 77 %   Neutro Abs 14.0 (*) 1.7 - 7.7 K/uL   Lymphocytes Relative 8 (*) 12 - 46 %   Lymphs Abs 1.4  0.7 - 4.0 K/uL   Monocytes Relative 8  3 - 12 %   Monocytes Absolute 1.3 (*) 0.1 - 1.0 K/uL   Eosinophils Relative 0  0 - 5 %   Eosinophils Absolute 0.0  0.0 - 0.7 K/uL   Basophils Relative  0  0 - 1 %   Basophils Absolute 0.0  0.0 - 0.1 K/uL  COMPREHENSIVE METABOLIC PANEL      Result Value Ref Range   Sodium 133 (*) 137 - 147 mEq/L   Potassium 4.0  3.7 - 5.3 mEq/L   Chloride 89 (*) 96 - 112 mEq/L   CO2 27  19 - 32 mEq/L   Glucose, Bld 178 (*) 70 - 99 mg/dL   BUN 15  6 - 23 mg/dL   Creatinine, Ser 0.61  0.50 - 1.10 mg/dL   Calcium 10.7 (*) 8.4 - 10.5 mg/dL   Total Protein 8.0  6.0 - 8.3 g/dL   Albumin 4.1  3.5 - 5.2 g/dL   AST 15  0 - 37 U/L   ALT 20  0 - 35 U/L   Alkaline Phosphatase 61  39 - 117 U/L   Total Bilirubin 1.5 (*) 0.3 - 1.2 mg/dL   GFR calc non Af Amer 90 (*) >90 mL/min   GFR calc Af Amer >90  >90 mL/min  LIPASE, BLOOD      Result Value Ref Range   Lipase 28  11 - 59 U/L  URINALYSIS, ROUTINE W REFLEX MICROSCOPIC      Result Value Ref Range   Color, Urine YELLOW  YELLOW   APPearance CLOUDY (*) CLEAR   Specific Gravity, Urine 1.016  1.005 - 1.030   pH 5.5  5.0 - 8.0   Glucose, UA NEGATIVE  NEGATIVE mg/dL   Hgb urine dipstick LARGE (*) NEGATIVE   Bilirubin Urine NEGATIVE  NEGATIVE   Ketones, ur NEGATIVE  NEGATIVE mg/dL   Protein, ur NEGATIVE  NEGATIVE mg/dL   Urobilinogen, UA 0.2  0.0 - 1.0 mg/dL   Nitrite NEGATIVE  NEGATIVE   Leukocytes, UA LARGE (*) NEGATIVE  URINE MICROSCOPIC-ADD ON      Result Value Ref Range   Squamous Epithelial / LPF MANY (*) RARE   WBC,  UA TOO NUMEROUS TO COUNT  <3 WBC/hpf   RBC / HPF 11-20  <3 RBC/hpf   Bacteria, UA FEW (*) RARE  CBC      Result Value Ref Range   WBC 15.9 (*) 4.0 - 10.5 K/uL   RBC 4.38  3.87 - 5.11 MIL/uL   Hemoglobin 13.7  12.0 - 15.0 g/dL   HCT 40.1  36.0 - 46.0 %   MCV 91.6  78.0 - 100.0 fL   MCH 31.3  26.0 - 34.0 pg   MCHC 34.2  30.0 - 36.0 g/dL   RDW 12.5  11.5 - 15.5 %   Platelets 209  150 - 400 K/uL  BASIC METABOLIC PANEL      Result Value Ref Range   Sodium 133 (*) 137 - 147 mEq/L   Potassium 3.8  3.7 - 5.3 mEq/L   Chloride 92 (*) 96 - 112 mEq/L   CO2 24  19 - 32 mEq/L   Glucose, Bld 203 (*) 70 - 99 mg/dL   BUN 11  6 - 23 mg/dL   Creatinine, Ser 0.58  0.50 - 1.10 mg/dL   Calcium 9.5  8.4 - 10.5 mg/dL   GFR calc non Af Amer >90  >90 mL/min   GFR calc Af Amer >90  >90 mL/min  HEMOGLOBIN A1C      Result Value Ref Range   Hemoglobin A1C 8.0 (*) <5.7 %   Mean Plasma Glucose 183 (*) <117 mg/dL  LACTIC ACID, PLASMA      Result Value  Ref Range   Lactic Acid, Venous 1.8  0.5 - 2.2 mmol/L  GLUCOSE, CAPILLARY      Result Value Ref Range   Glucose-Capillary 176 (*) 70 - 99 mg/dL  GLUCOSE, CAPILLARY      Result Value Ref Range   Glucose-Capillary 140 (*) 70 - 99 mg/dL  CBC      Result Value Ref Range   WBC 8.9  4.0 - 10.5 K/uL   RBC 4.18  3.87 - 5.11 MIL/uL   Hemoglobin 13.0  12.0 - 15.0 g/dL   HCT 39.6  36.0 - 46.0 %   MCV 94.7  78.0 - 100.0 fL   MCH 31.1  26.0 - 34.0 pg   MCHC 32.8  30.0 - 36.0 g/dL   RDW 12.7  11.5 - 15.5 %   Platelets 181  150 - 400 K/uL  BASIC METABOLIC PANEL      Result Value Ref Range   Sodium 142  137 - 147 mEq/L   Potassium 4.6  3.7 - 5.3 mEq/L   Chloride 103  96 - 112 mEq/L   CO2 25  19 - 32 mEq/L   Glucose, Bld 178 (*) 70 - 99 mg/dL   BUN 15  6 - 23 mg/dL   Creatinine, Ser 0.59  0.50 - 1.10 mg/dL   Calcium 8.9  8.4 - 10.5 mg/dL   GFR calc non Af Amer >90  >90 mL/min   GFR calc Af Amer >90  >90 mL/min  GLUCOSE, CAPILLARY      Result Value Ref  Range   Glucose-Capillary 135 (*) 70 - 99 mg/dL  GLUCOSE, CAPILLARY      Result Value Ref Range   Glucose-Capillary 182 (*) 70 - 99 mg/dL   Comment 1 Notify RN     Comment 2 Documented in Chart    GLUCOSE, CAPILLARY      Result Value Ref Range   Glucose-Capillary 159 (*) 70 - 99 mg/dL  GLUCOSE, CAPILLARY      Result Value Ref Range   Glucose-Capillary 151 (*) 70 - 99 mg/dL  BASIC METABOLIC PANEL      Result Value Ref Range   Sodium 140  137 - 147 mEq/L   Potassium 4.6  3.7 - 5.3 mEq/L   Chloride 102  96 - 112 mEq/L   CO2 24  19 - 32 mEq/L   Glucose, Bld 159 (*) 70 - 99 mg/dL   BUN 11  6 - 23 mg/dL   Creatinine, Ser 0.57  0.50 - 1.10 mg/dL   Calcium 8.7  8.4 - 10.5 mg/dL   GFR calc non Af Amer >90  >90 mL/min   GFR calc Af Amer >90  >90 mL/min  CBC      Result Value Ref Range   WBC 8.2  4.0 - 10.5 K/uL   RBC 4.10  3.87 - 5.11 MIL/uL   Hemoglobin 12.6  12.0 - 15.0 g/dL   HCT 38.4  36.0 - 46.0 %   MCV 93.7  78.0 - 100.0 fL   MCH 30.7  26.0 - 34.0 pg   MCHC 32.8  30.0 - 36.0 g/dL   RDW 12.5  11.5 - 15.5 %   Platelets 210  150 - 400 K/uL  GLUCOSE, CAPILLARY      Result Value Ref Range   Glucose-Capillary 112 (*) 70 - 99 mg/dL  GLUCOSE, CAPILLARY      Result Value Ref Range   Glucose-Capillary 128 (*) 70 - 99 mg/dL  Comment 1 Notify RN     Comment 2 Documented in Chart    GLUCOSE, CAPILLARY      Result Value Ref Range   Glucose-Capillary 168 (*) 70 - 99 mg/dL  I-STAT CG4 LACTIC ACID, ED      Result Value Ref Range   Lactic Acid, Venous 2.72 (*) 0.5 - 2.2 mmol/L  CBG MONITORING, ED      Result Value Ref Range   Glucose-Capillary 180 (*) 70 - 99 mg/dL   Comment 1 Documented in Chart     Comment 2 Notify RN     Ct Abdomen Pelvis Wo Contrast  02/12/2014   CLINICAL DATA:  Fever.  Flank pain.  Urinary tract infection.  EXAM: CT ABDOMEN AND PELVIS WITHOUT CONTRAST  TECHNIQUE: Multidetector CT imaging of the abdomen and pelvis was performed following the standard protocol  without IV contrast.  COMPARISON:  06/19/2008  FINDINGS: Lung bases show scarring and/or atelectasis. No pleural or pericardial fluid.  The liver has a normal appearance without contrast. No calcified gallstones. The spleen is normal. The pancreas is normal. The adrenal glands are normal. 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 No evidence of hydronephrosis or stranding on this side. On the left, the kidney shows normal size and does not contain any focal lesions. One could question mild stranding of the surrounding fat but this is not definite. No stone seen along the course of either ureter. No stone in the bladder.  There is atherosclerosis of the aorta and its branch vessels but no aneurysm. The IVC is normal. No retroperitoneal mass or adenopathy. No free intraperitoneal fluid or air. The patient has had hysterectomy. There is diverticulosis of the colon without evidence of diverticulitis. No significant bony finding.  IMPRESSION: No sign of renal obstruction or abscess. One could question mild stranding of the fat around the left kidney that could go along with pyelonephritis.  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   Electronically Signed   By: Nelson Chimes M.D.   On: 02/12/2014 20:23    Results for orders placed during the hospital encounter of 02/12/14  CULTURE, BLOOD (ROUTINE X 2)      Result Value Ref Range   Specimen Description BLOOD ARM LEFT     Special Requests BOTTLES DRAWN AEROBIC AND ANAEROBIC 10CC     Culture  Setup Time       Value: 02/13/2014 02:29     Performed at [facility]   Culture       Value:        BLOOD CULTURE RECEIVED NO GROWTH TO DATE CULTURE WILL BE HELD FOR 5 DAYS BEFORE ISSUING A FINAL NEGATIVE REPORT     Performed at [facility]   Report Status PENDING    CULTURE, BLOOD (ROUTINE X 2)      Result Value Ref Range   Specimen Description BLOOD RIGHT ARM      Special Requests BAA 5CC BLUE 3CC RED     Culture  Setup Time       Value: 02/13/2014 03:50     Performed at [facility]   Culture       Value:        BLOOD CULTURE RECEIVED NO GROWTH TO DATE CULTURE WILL BE HELD FOR 5 DAYS BEFORE ISSUING A FINAL NEGATIVE REPORT     Performed at [facility]   Report  Status PENDING    URINE CULTURE      Result Value Ref Range   Specimen Description URINE, CLEAN CATCH     Special Requests NONE     Culture  Setup Time       Value: 02/12/2014 20:16     Performed at [facility]       Value: NO GROWTH     Performed at [facility]   Culture       Value: NO GROWTH     Performed at [facility]   Report Status 02/13/2014 FINAL    CBC WITH DIFFERENTIAL      Result Value Ref Range   WBC 16.6 (*) 4.0 - 10.5 K/uL   RBC 4.60  3.87 - 5.11 MIL/uL   Hemoglobin 14.5  12.0 - 15.0 g/dL   HCT 42.0  36.0 - 46.0 %   MCV 91.3  78.0 - 100.0 fL   MCH 31.5  26.0 - 34.0 pg   MCHC 34.5  30.0 - 36.0 g/dL   RDW 12.6  11.5 - 15.5 %   Platelets 237  150 - 400 K/uL   Neutrophils Relative % 84 (*) 43 - 77 %   Neutro Abs 14.0 (*) 1.7 - 7.7 K/uL   Lymphocytes Relative 8 (*) 12 - 46 %   Lymphs Abs 1.4  0.7 - 4.0 K/uL   Monocytes Relative 8  3 - 12 %   Monocytes Absolute 1.3 (*) 0.1 - 1.0 K/uL   Eosinophils Relative 0  0 - 5 %   Eosinophils Absolute 0.0  0.0 - 0.7 K/uL   Basophils Relative 0  0 - 1 %   Basophils Absolute 0.0  0.0 - 0.1 K/uL  COMPREHENSIVE METABOLIC PANEL      Result Value Ref Range   Sodium 133 (*) 137 - 147 mEq/L   Potassium 4.0  3.7 - 5.3 mEq/L   Chloride 89 (*) 96 - 112 mEq/L   CO2 27  19 - 32 mEq/L   Glucose, Bld 178 (*) 70 - 99 mg/dL   BUN 15  6 - 23 mg/dL   Creatinine, Ser 0.61  0.50 - 1.10 mg/dL   Calcium 10.7 (*) 8.4 - 10.5 mg/dL   Total Protein 8.0  6.0 - 8.3 g/dL   Albumin 4.1  3.5 - 5.2 g/dL   AST 15  0 - 37 U/L   ALT 20  0 - 35 U/L   Alkaline Phosphatase 61  39 - 117 U/L    Total Bilirubin 1.5 (*) 0.3 - 1.2 mg/dL   GFR calc non Af Amer 90 (*) >90 mL/min   GFR calc Af Amer >90  >90 mL/min  LIPASE, BLOOD      Result Value Ref Range   Lipase 28  11 - 59 U/L  URINALYSIS, ROUTINE W REFLEX MICROSCOPIC      Result Value Ref Range   Color, Urine YELLOW  YELLOW   APPearance CLOUDY (*) CLEAR   Specific Gravity, Urine 1.016  1.005 - 1.030   pH 5.5  5.0 - 8.0   Glucose, UA NEGATIVE  NEGATIVE mg/dL   Hgb urine dipstick LARGE (*) NEGATIVE   Bilirubin Urine NEGATIVE  NEGATIVE   Ketones, ur NEGATIVE  NEGATIVE mg/dL   Protein, ur NEGATIVE  NEGATIVE mg/dL   Urobilinogen, UA 0.2  0.0 - 1.0 mg/dL   Nitrite NEGATIVE  NEGATIVE   Leukocytes, UA LARGE (*) NEGATIVE  URINE MICROSCOPIC-ADD  ON      Result Value Ref Range   Squamous Epithelial / LPF MANY (*) RARE   WBC, UA TOO NUMEROUS TO COUNT  <3 WBC/hpf   RBC / HPF 11-20  <3 RBC/hpf   Bacteria, UA FEW (*) RARE  CBC      Result Value Ref Range   WBC 15.9 (*) 4.0 - 10.5 K/uL   RBC 4.38  3.87 - 5.11 MIL/uL   Hemoglobin 13.7  12.0 - 15.0 g/dL   HCT 40.1  36.0 - 46.0 %   MCV 91.6  78.0 - 100.0 fL   MCH 31.3  26.0 - 34.0 pg   MCHC 34.2  30.0 - 36.0 g/dL   RDW 12.5  11.5 - 15.5 %   Platelets 209  150 - 400 K/uL  BASIC METABOLIC PANEL      Result Value Ref Range   Sodium 133 (*) 137 - 147 mEq/L   Potassium 3.8  3.7 - 5.3 mEq/L   Chloride 92 (*) 96 - 112 mEq/L   CO2 24  19 - 32 mEq/L   Glucose, Bld 203 (*) 70 - 99 mg/dL   BUN 11  6 - 23 mg/dL   Creatinine, Ser 0.58  0.50 - 1.10 mg/dL   Calcium 9.5  8.4 - 10.5 mg/dL   GFR calc non Af Amer >90  >90 mL/min   GFR calc Af Amer >90  >90 mL/min  HEMOGLOBIN A1C      Result Value Ref Range   Hemoglobin A1C 8.0 (*) <5.7 %   Mean Plasma Glucose 183 (*) <117 mg/dL  LACTIC ACID, PLASMA      Result Value Ref Range   Lactic Acid, Venous 1.8  0.5 - 2.2 mmol/L  GLUCOSE, CAPILLARY      Result Value Ref Range   Glucose-Capillary 176 (*) 70 - 99 mg/dL  GLUCOSE, CAPILLARY       Result Value Ref Range   Glucose-Capillary 140 (*) 70 - 99 mg/dL  CBC      Result Value Ref Range   WBC 8.9  4.0 - 10.5 K/uL   RBC 4.18  3.87 - 5.11 MIL/uL   Hemoglobin 13.0  12.0 - 15.0 g/dL   HCT 39.6  36.0 - 46.0 %   MCV 94.7  78.0 - 100.0 fL   MCH 31.1  26.0 - 34.0 pg   MCHC 32.8  30.0 - 36.0 g/dL   RDW 12.7  11.5 - 15.5 %   Platelets 181  150 - 400 K/uL  BASIC METABOLIC PANEL      Result Value Ref Range   Sodium 142  137 - 147 mEq/L   Potassium 4.6  3.7 - 5.3 mEq/L   Chloride 103  96 - 112 mEq/L   CO2 25  19 - 32 mEq/L   Glucose, Bld 178 (*) 70 - 99 mg/dL   BUN 15  6 - 23 mg/dL   Creatinine, Ser 0.59  0.50 - 1.10 mg/dL   Calcium 8.9  8.4 - 10.5 mg/dL   GFR calc non Af Amer >90  >90 mL/min   GFR calc Af Amer >90  >90 mL/min  GLUCOSE, CAPILLARY      Result Value Ref Range   Glucose-Capillary 135 (*) 70 - 99 mg/dL  GLUCOSE, CAPILLARY      Result Value Ref Range   Glucose-Capillary 182 (*) 70 - 99 mg/dL   Comment 1 Notify RN     Comment 2 Documented in Chart  GLUCOSE, CAPILLARY      Result Value Ref Range   Glucose-Capillary 159 (*) 70 - 99 mg/dL  GLUCOSE, CAPILLARY      Result Value Ref Range   Glucose-Capillary 151 (*) 70 - 99 mg/dL  BASIC METABOLIC PANEL      Result Value Ref Range   Sodium 140  137 - 147 mEq/L   Potassium 4.6  3.7 - 5.3 mEq/L   Chloride 102  96 - 112 mEq/L   CO2 24  19 - 32 mEq/L   Glucose, Bld 159 (*) 70 - 99 mg/dL   BUN 11  6 - 23 mg/dL   Creatinine, Ser 0.57  0.50 - 1.10 mg/dL   Calcium 8.7  8.4 - 10.5 mg/dL   GFR calc non Af Amer >90  >90 mL/min   GFR calc Af Amer >90  >90 mL/min  CBC      Result Value Ref Range   WBC 8.2  4.0 - 10.5 K/uL   RBC 4.10  3.87 - 5.11 MIL/uL   Hemoglobin 12.6  12.0 - 15.0 g/dL   HCT 38.4  36.0 - 46.0 %   MCV 93.7  78.0 - 100.0 fL   MCH 30.7  26.0 - 34.0 pg   MCHC 32.8  30.0 - 36.0 g/dL   RDW 12.5  11.5 - 15.5 %   Platelets 210  150 - 400 K/uL  GLUCOSE, CAPILLARY      Result Value Ref Range    Glucose-Capillary 112 (*) 70 - 99 mg/dL  GLUCOSE, CAPILLARY      Result Value Ref Range   Glucose-Capillary 128 (*) 70 - 99 mg/dL   Comment 1 Notify RN     Comment 2 Documented in Chart    GLUCOSE, CAPILLARY      Result Value Ref Range   Glucose-Capillary 168 (*) 70 - 99 mg/dL  I-STAT CG4 LACTIC ACID, ED      Result Value Ref Range   Lactic Acid, Venous 2.72 (*) 0.5 - 2.2 mmol/L  CBG MONITORING, ED      Result Value Ref Range   Glucose-Capillary 180 (*) 70 - 99 mg/dL   Comment 1 Documented in Chart     Comment 2 Notify RN       Fredia Sorrow, MD 02/15/14 1142

## 2014-02-15 NOTE — Progress Notes (Signed)
Discussed discharge summary with pt. Reviewed medications with pt. Pt had no further questions. Pt ready for discharge and waiting for ride.

## 2014-02-15 NOTE — Discharge Summary (Signed)
Physician Discharge Summary  Ruth [name] HYW:[id number] DOB: 08-02-1946 DOA: 02/12/2014  PCP: Jerlyn Ly, MD  Admit date: 02/12/2014 Discharge date: 02/15/2014  Recommendations for Outpatient Follow-up:  1. Pt will need to follow up with PCP in 1-2 weeks post discharge 2. Please obtain BMP    Discharge Diagnoses:  Principal Problem:   Acute pyelonephritis Active Problems:   Coronary artery disease   Diabetes mellitus   Sepsis secondary to UTI   Hyponatremia   HTN (hypertension)   Type II or unspecified type diabetes mellitus without mention of complication, uncontrolled Acute Pyelonephritis  -Continue Ceftriaxone.  -WBC improving, blood pressure stable  -Follow urine culture--remains negative to date  -Anti-emetics and pain medications as needed.  -Blood cultures negative to date  -home with cefpodoxime 268m bid x 11 days which will complete 14 days of abx therapy -pt refuses to restart cipro or other quinolone because she felt she had a flush/swelling reaction to it prior to admission Sepsis  Repeat lactic acid level--1.8.  -BP is soft-->improved.  -Stop her diuretics and ARB  -Continue metoprolol tartrate 50 mg twice a day  Mild Hyponatremia  -Likely due to hypovolemia/sepsis.  -Continue NS-->improved -also partly due to HCTZ and spironolactone  History of CAD  Stable.  -Continue ASA and BB at reduced dose and with holding parameters.  -Patient took her own metoprolol am6/8. She was asked not to take any of her home medications without talking to RN or MD.  -home with metoprolol 50 mg bid History of Hypertension  -BP remains stable/controlled with metoprolol tartrate 529mbid -home with HCTZ 12.8m76maily  -d/c telmisartan and spironolactone History of Diet Controlled Diabetes Mellitus Type 2  -Hemoglobin A1c 8.0  -Patient refuses insulin  -She refuses any oral hypoglycemics  -She insists on following up with her PCP to negotiate any further need of  treatment   Discharge Condition: stable  Disposition: home  Diet:carb modified Wt Readings from Last 3 Encounters:  02/12/14 85.594 kg (188 lb 11.2 oz)  09/19/13 87.998 kg (194 lb)  07/21/13 87.998 kg (194 lb)    History of present illness:  68 14[age]. female who went to her PCPs office on day of admission with L flank pain. She was diagnosed with UTI, sent home on cipro and told to call if she developed fever. At home developed fever of 101.3 (101.2 here in ED), as well as persistent N/V. The patient was started on intravenous ceftriaxone. Blood cultures were obtained. The patient was started intravenous fluids. She clinically improved. Blood cultures remained negative. Unfortunately urine cultures were nondiagnostic. Initial WBC was 16.6 which improved to 8.2 on the day of discharge. The patient did not want to be started back on ciprofloxacin or quinolones do to her concern for swelling and flushing on the medication prior to admission. The patient will be sent home with cefpodoxime for 11 more days.     Discharge Exam: Filed Vitals:   02/15/14 0602  BP: 126/69  Pulse: 68  Temp: 98.4 F (36.9 C)  Resp: 16   Filed Vitals:   02/14/14 2121 02/14/14 2140 02/14/14 2300 02/15/14 0602  BP: 135/77 171/80 140/54 126/69  Pulse: 80 80 71 68  Temp:  98.1 F (36.7 C)  98.4 F (36.9 C)  TempSrc:  Oral  Oral  Resp:  18  16  Height:      Weight:      SpO2:  97%  96%   General: A&O x 3, NAD, pleasant, cooperative Cardiovascular:  RRR, no rub, no gallop, no S3 Respiratory: CTAB, no wheeze, no rhonchi Abdomen:soft, nontender, nondistended, positive bowel sounds Extremities: No edema, No lymphangitis, no petechiae  Discharge Instructions      Discharge Instructions   Diet - low sodium heart healthy    Complete by:  As directed      Increase activity slowly    Complete by:  As directed             Medication List    STOP taking these medications       spironolactone 25 MG  tablet  Commonly known as:  ALDACTONE     telmisartan-hydrochlorothiazide 80-12.5 MG per tablet  Commonly known as:  MICARDIS HCT      TAKE these medications       aspirin 325 MG tablet  Take 325 mg by mouth daily.     calcium carbonate 500 MG chewable tablet  Commonly known as:  TUMS - dosed in mg elemental calcium  Chew 1 tablet by mouth 2 (two) times daily as needed. For heartburn     cefpodoxime 200 MG tablet  Commonly known as:  VANTIN  Take 1 tablet (200 mg total) by mouth every 12 (twelve) hours.     cholecalciferol 1000 UNITS tablet  Commonly known as:  VITAMIN D  Take 1,000 Units by mouth daily.     hydrochlorothiazide 12.5 MG capsule  Commonly known as:  MICROZIDE  Take 1 capsule (12.5 mg total) by mouth daily.     metoprolol 50 MG tablet  Commonly known as:  LOPRESSOR  Take 1 tablet (50 mg total) by mouth 2 (two) times daily.     nitroGLYCERIN 0.4 MG SL tablet  Commonly known as:  NITROSTAT  Place 1 tablet (0.4 mg total) under the tongue every 5 (five) minutes as needed for chest pain (up to 3 doses).     vitamin B-12 1000 MCG tablet  Commonly known as:  CYANOCOBALAMIN  Take 1,000 mcg by mouth daily.         The results of significant diagnostics from this hospitalization (including imaging, microbiology, ancillary and laboratory) are listed below for reference.    Significant Diagnostic Studies: Ct Abdomen Pelvis Wo Contrast  02/12/2014   CLINICAL DATA:  Fever.  Flank pain.  Urinary tract infection.  EXAM: CT ABDOMEN AND PELVIS WITHOUT CONTRAST  TECHNIQUE: Multidetector CT imaging of the abdomen and pelvis was performed following the standard protocol without IV contrast.  COMPARISON:  06/19/2008  FINDINGS: Lung bases show scarring and/or atelectasis. No pleural or pericardial fluid.  The liver has a normal appearance without contrast. No calcified gallstones. The spleen is normal. The pancreas is normal. The adrenal glands are normal. 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 No evidence of hydronephrosis or stranding on this side. On the left, the kidney shows normal size and does not contain any focal lesions. One could question mild stranding of the surrounding fat but this is not definite. No stone seen along the course of either ureter. No stone in the bladder.  There is atherosclerosis of the aorta and its branch vessels but no aneurysm. The IVC is normal. No retroperitoneal mass or adenopathy. No free intraperitoneal fluid or air. The patient has had hysterectomy. There is diverticulosis of the colon without evidence of diverticulitis. No significant bony finding.  IMPRESSION: No sign of renal obstruction or abscess. One could question mild stranding of the fat around  the left kidney that could go along with pyelonephritis.  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   Electronically Signed   By: Nelson Chimes M.D.   On: 02/12/2014 20:23     Microbiology: Recent Results (from the past 240 hour(s))  CULTURE, BLOOD (ROUTINE X 2)     Status: None   Collection Time    02/12/14  6:50 PM      Result Value Ref Range Status   Specimen Description BLOOD ARM LEFT   Final   Special Requests BOTTLES DRAWN AEROBIC AND ANAEROBIC 10CC   Final   Culture  Setup Time     Final   Value: 02/13/2014 02:29     Performed at [facility]   Culture     Final   Value:        BLOOD CULTURE RECEIVED NO GROWTH TO DATE CULTURE WILL BE HELD FOR 5 DAYS BEFORE ISSUING A FINAL NEGATIVE REPORT     Performed at [facility]   Report Status PENDING   Incomplete  URINE CULTURE     Status: None   Collection Time    02/12/14  7:45 PM      Result Value Ref Range Status   Specimen Description URINE, CLEAN CATCH   Final   Special Requests NONE   Final   Culture  Setup Time     Final   Value: 02/12/2014 20:16     Performed at [facility]     Final   Value:  NO GROWTH     Performed at [facility]   Culture     Final   Value: NO GROWTH     Performed at [facility]   Report Status 02/13/2014 FINAL   Final  CULTURE, BLOOD (ROUTINE X 2)     Status: None   Collection Time    02/13/14 12:15 AM      Result Value Ref Range Status   Specimen Description BLOOD RIGHT ARM   Final   Special Requests BAA 5CC BLUE 3CC RED   Final   Culture  Setup Time     Final   Value: 02/13/2014 03:50     Performed at [facility]   Culture     Final   Value:        BLOOD CULTURE RECEIVED NO GROWTH TO DATE CULTURE WILL BE HELD FOR 5 DAYS BEFORE ISSUING A FINAL NEGATIVE REPORT     Performed at [facility]   Report Status PENDING   Incomplete     Labs: Basic Metabolic Panel:  Recent Labs Lab 02/12/14 1633 02/13/14 0015 02/14/14 0432 02/15/14 0426  NA 133* 133* 142 140  K 4.0 3.8 4.6 4.6  CL 89* 92* 103 102  CO2 27 24 25 24   GLUCOSE 178* 203* 178* 159*  BUN 15 11 15 11   CREATININE 0.61 0.58 0.59 0.57  CALCIUM 10.7* 9.5 8.9 8.7   Liver Function Tests:  Recent Labs Lab 02/12/14 1633  AST 15  ALT 20  ALKPHOS 61  BILITOT 1.5*  PROT 8.0  ALBUMIN 4.1    Recent Labs Lab 02/12/14 1633  LIPASE 28   No results found for this basename: AMMONIA,  in the last 168 hours CBC:  Recent Labs Lab 02/12/14 1633 02/13/14 0015 02/14/14 0432 02/15/14 0426  WBC 16.6* 15.9* 8.9 8.2  NEUTROABS 14.0*  --   --   --  HGB 14.5 13.7 13.0 12.6  HCT 42.0 40.1 39.6 38.4  MCV 91.3 91.6 94.7 93.7  PLT 237 209 181 210   Cardiac Enzymes: No results found for this basename: CKTOTAL, CKMB, CKMBINDEX, TROPONINI,  in the last 168 hours BNP: No components found with this basename: POCBNP,  CBG:  Recent Labs Lab 02/13/14 2252 02/14/14 0824 02/14/14 1207 02/14/14 1750 02/14/14 2138  GLUCAP 182* 159* 151* 112* 128*    Time coordinating discharge:  Greater than 30 minutes  Signed:  Verlan Grotz, DO [facility] Pager: [phone] 02/15/2014, 8:51 AM

## 2014-02-19 LAB — CULTURE, BLOOD (ROUTINE X 2)
CULTURE: NO GROWTH
Culture: NO GROWTH

## 2014-02-26 ENCOUNTER — Ambulatory Visit: Payer: Medicare Other

## 2014-08-16 ENCOUNTER — Encounter (HOSPITAL_COMMUNITY): Payer: Self-pay | Admitting: Cardiovascular Disease

## 2015-01-08 ENCOUNTER — Telehealth: Payer: Self-pay | Admitting: Cardiovascular Disease

## 2015-01-08 NOTE — Telephone Encounter (Signed)
Received records from [facility] for appointment with Dr Claiborne Billings on 03/04/15.  Records given to [facility] (medical records) for Dr Evette Georges schedule on 03/04/15.  lp

## 2015-03-04 ENCOUNTER — Encounter: Payer: Self-pay | Admitting: Cardiovascular Disease

## 2015-03-04 ENCOUNTER — Ambulatory Visit (INDEPENDENT_AMBULATORY_CARE_PROVIDER_SITE_OTHER): Payer: Medicare Other | Admitting: Cardiovascular Disease

## 2015-03-04 VITALS — BP 192/100 | HR 65 | Ht 63.5 in | Wt 192.5 lb

## 2015-03-04 DIAGNOSIS — R5383 Other fatigue: Secondary | ICD-10-CM

## 2015-03-04 DIAGNOSIS — I10 Essential (primary) hypertension: Secondary | ICD-10-CM | POA: Diagnosis not present

## 2015-03-04 DIAGNOSIS — E1159 Type 2 diabetes mellitus with other circulatory complications: Secondary | ICD-10-CM

## 2015-03-04 DIAGNOSIS — E119 Type 2 diabetes mellitus without complications: Secondary | ICD-10-CM | POA: Diagnosis not present

## 2015-03-04 DIAGNOSIS — E669 Obesity, unspecified: Secondary | ICD-10-CM

## 2015-03-04 DIAGNOSIS — Z79899 Other long term (current) drug therapy: Secondary | ICD-10-CM

## 2015-03-04 DIAGNOSIS — E785 Hyperlipidemia, unspecified: Secondary | ICD-10-CM | POA: Diagnosis not present

## 2015-03-04 DIAGNOSIS — I2510 Atherosclerotic heart disease of native coronary artery without angina pectoris: Secondary | ICD-10-CM

## 2015-03-04 MED ORDER — AMLODIPINE BESYLATE 10 MG PO TABS: 10.0000 mg | ORAL_TABLET | Freq: Every day | ORAL | Status: DC

## 2015-03-04 NOTE — Patient Instructions (Signed)
Your physician has requested that you have an echocardiogram. Echocardiography is a painless test that uses sound waves to create images of your heart. It provides your doctor with information about the size and shape of your heart and how well your heart's chambers and valves are working. This procedure takes approximately one hour. There are no restrictions for this procedure.  Your physician has requested that you have a lexiscan myoview. For further information please visit [email or web address]. Please follow instruction sheet, as given.  Your physician recommends that you return for lab work fasting.  Your physician has recommended you make the following change in your medication: the amlodipine has been increased from 5 mg to 10 mg. A new prescription has been sent to your pharmacy to reflect this change.  Your physician recommends that you schedule a follow-up appointment in: 2 months.

## 2015-03-04 NOTE — Progress Notes (Signed)
Patient ID: Ruth [name], female   DOB: [birth year], 69 y.o.   MRN: [id number]    PATIENT PROFILE: Ruth [name] is a 69 year old female who is followed by Dr. Crist Infante for primary care.  Over the last several years she has been cared for by Dr. Einar Gip at [facility] for cardiology care.  She presents to the office today to establish cardiology care with me.   HPI: [name] has a history of established coronary artery disease.  While she was in [place].  She developed chest discomfort and in 2004-11-26 underwent CABG surgery 4 at the [facility] in [place] by Dr. Marzetta Merino.  She had a IMA to LAD, SVG to diagonal, SVG to obtuse marginal, and SVG to the RCA.   Her husband died in 11-26-2010 and she moved to the [place] area.  In Nov 27, 2011, she experienced symptoms of increasing shortness of breath.  She underwent cardiac catheterization and a stent was placed in the vein graft to her RCA.  She states that she has not had any subsequent stress testing.  An echocardiogram done in July 2014 by Dr. Einar Gip showed normal LV size and function with mild concentric left ventricular hypertrophy.  She had mild to moderate mitral regurgitation, mild tricuspid regurgitation, and mild primary hypertension with an estimated PA pressure 39 mm.  Recently, she has noticed some left-sided chest pain and also admits to having gas in her left upper quadrant.  She has a history of hypertension for at least 15-20 years.  She states her blood pressure at home typically is in the 145-150 range, but her blood pressure is always elevated when she goes to the doctor's office.  She has a history of hyperlipidemia and apparently has been intolerant to statins but has never tried Zetia.  Is a history of diabetes mellitus but is not on therapy and states this is diet controlled.  She states she last saw Dr. Einar Gip 4 months ago.  She admits to recent increased stress.  Her mother died in 2014/12/25  and recently her dog died.  She does not routinely exercise.  She has been taking amlodipine 5 mg, losartan 50 mg twice a day and metoprolol 50 mrem twice a day for hypertension and CAD.  She presents to the office today to establish cardiology care with me.   Past Medical History  Diagnosis Date  . CAD (coronary artery disease)     a. CABG x 4 in November 26, 2004 in [place]. b. DES to midbody of SVG-PDA/PLA 07/2012.  . Labile hypertension   . Dyslipidemia     Intolerant of statins  . Diverticulosis   . IBS (irritable bowel syndrome)   . Obesity   . Hyperplastic colon polyp   . Fecal incontinence   . Multiple allergies   . Anxiety   . Obesity   . Complication of anesthesia     "quit breathing when I got ?[place]" (07/28/2012)  . Steatohepatitis   . Migraines     "had them in my 30's" (07/28/2012)  . GERD (gastroesophageal reflux disease)   . Type II diabetes mellitus     no meds, diet controlled  . Hyperlipidemia     Past Surgical History  Procedure Laterality Date  . Inguinal hernia repair  1970's?    left  . Excisional hemorrhoidectomy  1970's  . Breast lumpectomy      bilateral  . Cardiac catheterization  2004/11/26  . Coronary angioplasty with stent  placement  07/28/2012    "1; first time for me" (07/28/2012)  . Coronary artery bypass graft  2006    CABG X4  . Abdominal hysterectomy  1970's  . Tonsillectomy and adenoidectomy  ~ 1951  . Left heart catheterization with coronary/graft angiogram N/A 07/28/2012    Procedure: LEFT HEART CATHETERIZATION WITH Beatrix Fetters;  Surgeon: Burnell Blanks, MD;  Location: [facility] C[facility];  Service: Cardiovascular;  Laterality: N/A;  . Percutaneous coronary stent intervention (pci-s)  07/28/2012    Procedure: PERCUTANEOUS CORONARY STENT INTERVENTION (PCI-S);  Surgeon: Burnell Blanks, MD;  Location: [facility] CATH LAB;  Service: Cardiovascular;;    Allergies  Allergen Reactions  . Betadine [Povidone Iodine] Swelling  .  Codeine Anaphylaxis and Other (See Comments)    "quit breathing" (07/28/2012)  . Demerol Other (See Comments)    "quit breathing" (07/28/2012)  . Latex Other (See Comments)    "quit breathing" (07/28/2012)  . Other Other (See Comments)    Perfume, Any Fragrance. "quit breathing" (07/28/2012)  . Percocet [Oxycodone-Acetaminophen] Other (See Comments)    "quit breathing; disoriented" (07/28/2012)  . Plavix [Clopidogrel Bisulfate] Other (See Comments)    "get hot; like I'm burning up inside; had to put me in shower after OHS because of that" (07/28/2012)  . Red Dye Other (See Comments)    "quit breathing" (07/28/2012)  . Shrimp Flavor Shortness Of Breath and Other (See Comments)    "broke out in knots all over" (07/28/2012)  . Shrimp [Shellfish Allergy] Shortness Of Breath and Other (See Comments)    "broke out in knots all over" (07/28/2012)  . Sulfonamide Derivatives Rash and Other (See Comments)    "quit breathing" (07/28/2012)  . Tylenol [Acetaminophen] Shortness Of Breath  . Iohexol     Unknown    Current Outpatient Prescriptions  Medication Sig Dispense Refill  . amLODipine (NORVASC) 5 MG tablet Take 1 tablet by mouth daily.  0  . aspirin 325 MG tablet Take 325 mg by mouth daily.    . calcium carbonate (TUMS - DOSED IN MG ELEMENTAL CALCIUM) 500 MG chewable tablet Chew 1 tablet by mouth 2 (two) times daily as needed. For heartburn    . cholecalciferol (VITAMIN D) 1000 UNITS tablet Take 1,000 Units by mouth daily.    Marland Kitchen losartan (COZAAR) 100 MG tablet Take 50 mg by mouth 2 (two) times daily.  0  . Magnesium 250 MG TABS Take 0.5 tablets by mouth 2 (two) times daily.    . metoprolol (LOPRESSOR) 50 MG tablet Take 1 tablet (50 mg total) by mouth 2 (two) times daily. 60 tablet 0  . nitroGLYCERIN (NITROSTAT) 0.4 MG SL tablet Place 1 tablet (0.4 mg total) under the tongue every 5 (five) minutes as needed for chest pain (up to 3 doses). 25 tablet 4  . vitamin B-12 (CYANOCOBALAMIN) 1000  MCG tablet Take 1,000 mcg by mouth daily.     No current facility-administered medications for this visit.    History   Social History  . Marital Status: Widowed    Spouse Name: N/A  . Number of Children: N/A  . Years of Education: N/A   Occupational History  . Not on file.   Social History Main Topics  . Smoking status: Never Smoker   . Smokeless tobacco: Never Used  . Alcohol Use: No  . Drug Use: No  . Sexual Activity: No   Other Topics Concern  . Not on file   Social History Narrative   Social history  is notable that she is widowed.  She has 2 children, ages 43 and 48.  There is no tobacco use.  She does not routinely exercise.  Family History  Problem Relation Age of Onset  . Coronary artery disease Father   . Colon cancer Father   . Colon polyps Father   . Heart disease Father   . Hypertension    . Breast cancer      grandmother  . Diabetes Maternal Grandmother   . Diabetes Paternal Grandmother   . Stroke Mother   . Esophageal cancer Neg Hx   . Rectal cancer Neg Hx   . Stomach cancer Neg Hx    Family history is notable in that her father has heart disease and is 78 years old.  Her mother died at age 66 with a stroke.  She has 2 sisters, ages 50 and 5.  ROS General: Negative; No fevers, chills, or night sweats HEENT: Negative; No changes in vision or hearing, sinus congestion, difficulty swallowing Pulmonary: Negative; No cough, wheezing, shortness of breath, hemoptysis Cardiovascular: See HPI:  GI: Positive for left upper quadrant pain which he describes as "gas." GU: Negative; No dysuria, hematuria, or difficulty voiding Musculoskeletal: Negative; no myalgias, joint pain, or weakness Hematologic: Negative; no easy bruising, bleeding Endocrine: Positive for diabetes mellitus Neuro: Negative; no changes in balance, headaches Skin: Negative; No rashes or skin lesions Psychiatric: Negative; No behavioral problems, depression Sleep: Negative; No snoring,   daytime sleepiness, hypersomnolence, bruxism, restless legs, hypnogognic hallucinations. Other comprehensive 14 point system review is negative   Physical Exam BP 192/100 mmHg  Pulse 65  Ht 5' 3.5" (1.613 m)  Wt 192 lb 8 oz (87.317 kg)  BMI 33.56 kg/m2  Repeat blood pressure by me was 190/80  Wt Readings from Last 3 Encounters:  03/04/15 192 lb 8 oz (87.317 kg)  02/12/14 188 lb 11.2 oz (85.594 kg)  09/19/13 194 lb (87.998 kg)   General: Alert, oriented, no distress.  Skin: normal turgor, no rashes, warm and dry HEENT: Normocephalic, atraumatic. Pupils equal round and reactive to light; sclera anicteric; extraocular muscles intact, No lid lag; Nose without nasal septal hypertrophy; Mouth/Parynx benign; Mallinpatti scale 3 Neck: No JVD, no carotid bruits; normal carotid upstroke Lungs: clear to ausculatation and percussion bilaterally; no wheezing or rales, normal inspiratory and expiratory effort Chest wall: without tenderness to palpitation Heart: PMI not displaced, RRR, s1 s2 normal, 6-5/4 systolic murmur, No diastolic murmur, no rubs, gallops, thrills, or heaves Abdomen: soft, nontender; no hepatosplenomehaly, BS+; abdominal aorta nontender and not dilated by palpation. Back: no CVA tenderness Pulses: 2+  Musculoskeletal: full range of motion, normal strength, no joint deformities Extremities: Pulses 2+, no clubbing cyanosis or edema, Homan's sign negative  Neurologic: grossly nonfocal; Cranial nerves grossly wnl Psychologic: Normal mood and affect   ECG (independently read by me): Normal sinus rhythm at 65 bpm.  Mild T wave abnormality in lead 1 and aVL.  LABS:  BMP Latest Ref Rng 02/15/2014 02/14/2014 02/13/2014  Glucose 70 - 99 mg/dL 159(H) 178(H) 203(H)  BUN 6 - 23 mg/dL 11 15 11   Creatinine 0.50 - 1.10 mg/dL 0.57 0.59 0.58  Sodium 137 - 147 mEq/L 140 142 133(L)  Potassium 3.7 - 5.3 mEq/L 4.6 4.6 3.8  Chloride 96 - 112 mEq/L 102 103 92(L)  CO2 19 - 32 mEq/L 24 25 24     Calcium 8.4 - 10.5 mg/dL 8.7 8.9 9.5     Hepatic Function Latest Ref Rng 02/12/2014 07/07/2012  06/21/2012  Total Protein 6.0 - 8.3 g/dL 8.0 7.6 7.4  Albumin 3.5 - 5.2 g/dL 4.1 4.0 3.9  AST 0 - 37 U/L 15 23 22   ALT 0 - 35 U/L 20 22 23   Alk Phosphatase 39 - 117 U/L 61 68 50  Total Bilirubin 0.3 - 1.2 mg/dL 1.5(H) 0.5 1.0  Bilirubin, Direct 0.0 - 0.3 mg/dL - - 0.1    CBC Latest Ref Rng 02/15/2014 02/14/2014 02/13/2014  WBC 4.0 - 10.5 K/uL 8.2 8.9 15.9(H)  Hemoglobin 12.0 - 15.0 g/dL 12.6 13.0 13.7  Hematocrit 36.0 - 46.0 % 38.4 39.6 40.1  Platelets 150 - 400 K/uL 210 181 209   Lab Results  Component Value Date   MCV 93.7 02/15/2014   MCV 94.7 02/14/2014   MCV 91.6 02/13/2014    No results found for: TSH  BNP No results found for: BNP  ProBNP No results found for: PROBNP   Lipid Panel     Component Value Date/Time   CHOL 241* 06/21/2012 0833   TRIG 362.0* 06/21/2012 0833   HDL 30.20* 06/21/2012 0833   CHOLHDL 8 06/21/2012 0833   VLDL 72.4* 06/21/2012 0833   LDLDIRECT 160.9 06/21/2012 0833     RADIOLOGY: No results found.    ASSESSMENT AND PLAN: Ms. Lumi Winslett is a 69 year old female who is 10 years status post CBG revascularization surgery 4, which was done at the [facility] in [place], [place].  She is 3 years status post DES stenting to the vein graft supplying her RCA territory.  I reviewed records from Drs. Perini and Ganji  She recently has noticed some vague left-sided chest discomfort which is not definitively exertionally precipitated.  Her blood pressure today is elevated and is compatible with stage II hypertension despite taking amlodipine 5 mg, losartan 100 mg, and metoprolol 50 twice a day.  I am titrating her amlodipine to 10 mg daily for improved blood pressure control.  I am recommending that she undergo a 2-D echo Doppler study to evaluate systolic and diastolic function, as well as valvular architecture.  She has a 1 to 2/6 systolic  murmur which may be due to aortic sclerosis but also she previously is been documented have mild to moderate mitral regurgitation.  She has a history of significant hyperlipidemia and has refused to take statin therapy due to previous myalgias.  I discussed with her the possibility of initiating Zetia.  However, initially, I will obtain a complete set of laboratory including a Cmet, CBC, magnesium level, vitamin D level, TSH, and lipid panel.  I am scheduling her for a Gibbs study for further evaluation of her CAD and chest discomfort since she is 10 years status post bypass surgery and has had continued hypertension and hyperlipidemia.  I will see her in 6-8 weeks in follow-up of the above studies and further recommendations will be made at that time.  Time spent: 45 minutes   Troy Sine, MD, [facility]  03/04/2015 8:55 AM

## 2015-03-13 ENCOUNTER — Ambulatory Visit (HOSPITAL_COMMUNITY)
Admission: RE | Admit: 2015-03-13 | Discharge: 2015-03-13 | Disposition: A | Discharge status: Home / Self Care | Payer: Medicare Other | Source: Ambulatory Visit | Attending: Cardiovascular Disease | Admitting: Cardiovascular Disease

## 2015-03-13 DIAGNOSIS — I10 Essential (primary) hypertension: Secondary | ICD-10-CM | POA: Diagnosis not present

## 2015-03-13 DIAGNOSIS — I340 Nonrheumatic mitral (valve) insufficiency: Secondary | ICD-10-CM | POA: Diagnosis not present

## 2015-03-13 DIAGNOSIS — I2510 Atherosclerotic heart disease of native coronary artery without angina pectoris: Secondary | ICD-10-CM | POA: Diagnosis present

## 2015-03-13 DIAGNOSIS — I517 Cardiomegaly: Secondary | ICD-10-CM | POA: Insufficient documentation

## 2015-03-14 ENCOUNTER — Encounter: Payer: Self-pay | Admitting: *Deleted

## 2015-03-18 ENCOUNTER — Telehealth: Payer: Self-pay | Admitting: Cardiovascular Disease

## 2015-03-18 NOTE — Telephone Encounter (Signed)
Pt called in wanting to know if she needed to fast before having lab work done. Please call  Thanks

## 2015-03-18 NOTE — Telephone Encounter (Signed)
Pt. Informed to be fasting for her labs and that Shirlean Mylar would call her about her stress test

## 2015-03-18 NOTE — Telephone Encounter (Signed)
Follow Up   Pt returning call after getting disconnected. Please call.

## 2015-03-20 LAB — COMPREHENSIVE METABOLIC PANEL
ALBUMIN: 4.1 g/dL (ref 3.5–5.2)
ALK PHOS: 64 U/L (ref 39–117)
ALT: 20 U/L (ref 0–35)
AST: 20 U/L (ref 0–37)
BILIRUBIN TOTAL: 0.7 mg/dL (ref 0.2–1.2)
BUN: 13 mg/dL (ref 6–23)
CHLORIDE: 100 meq/L (ref 96–112)
CO2: 23 mEq/L (ref 19–32)
CREATININE: 0.59 mg/dL (ref 0.50–1.10)
Calcium: 9.5 mg/dL (ref 8.4–10.5)
Glucose, Bld: 197 mg/dL — ABNORMAL HIGH (ref 70–99)
POTASSIUM: 4.6 meq/L (ref 3.5–5.3)
Sodium: 139 mEq/L (ref 135–145)
TOTAL PROTEIN: 6.9 g/dL (ref 6.0–8.3)

## 2015-03-20 LAB — CBC
HEMATOCRIT: 42.1 % (ref 36.0–46.0)
HEMOGLOBIN: 14.1 g/dL (ref 12.0–15.0)
MCH: 30.8 pg (ref 26.0–34.0)
MCHC: 33.5 g/dL (ref 30.0–36.0)
MCV: 91.9 fL (ref 78.0–100.0)
MPV: 9.5 fL (ref 8.6–12.4)
Platelets: 267 10*3/uL (ref 150–400)
RBC: 4.58 MIL/uL (ref 3.87–5.11)
RDW: 13.7 % (ref 11.5–15.5)
WBC: 6 10*3/uL (ref 4.0–10.5)

## 2015-03-20 LAB — LIPID PANEL
CHOL/HDL RATIO: 8.4 ratio
Cholesterol: 244 mg/dL — ABNORMAL HIGH (ref 0–200)
HDL: 29 mg/dL — ABNORMAL LOW (ref >=46)
LDL CALC: 163 mg/dL — AB (ref 0–99)
TRIGLYCERIDES: 262 mg/dL — AB (ref <150)
VLDL: 52 mg/dL — ABNORMAL HIGH (ref 0–40)

## 2015-03-20 LAB — HEMOGLOBIN A1C
Hgb A1c MFr Bld: 8.7 % — ABNORMAL HIGH (ref <5.7)
MEAN PLASMA GLUCOSE: 203 mg/dL — AB (ref <117)

## 2015-03-20 LAB — MAGNESIUM: Magnesium: 1.8 mg/dL (ref 1.5–2.5)

## 2015-03-20 LAB — TSH: TSH: 1.987 u[IU]/mL (ref 0.350–4.500)

## 2015-03-21 ENCOUNTER — Ambulatory Visit (HOSPITAL_COMMUNITY)
Admission: RE | Admit: 2015-03-21 | Discharge: 2015-03-21 | Disposition: A | Discharge status: Home / Self Care | Payer: Medicare Other | Source: Ambulatory Visit | Attending: Cardiology | Admitting: Cardiology

## 2015-03-21 DIAGNOSIS — E663 Overweight: Secondary | ICD-10-CM | POA: Insufficient documentation

## 2015-03-21 DIAGNOSIS — Z8249 Family history of ischemic heart disease and other diseases of the circulatory system: Secondary | ICD-10-CM | POA: Insufficient documentation

## 2015-03-21 DIAGNOSIS — I2510 Atherosclerotic heart disease of native coronary artery without angina pectoris: Secondary | ICD-10-CM | POA: Insufficient documentation

## 2015-03-21 DIAGNOSIS — Z955 Presence of coronary angioplasty implant and graft: Secondary | ICD-10-CM | POA: Insufficient documentation

## 2015-03-21 DIAGNOSIS — Z6834 Body mass index (BMI) 34.0-34.9, adult: Secondary | ICD-10-CM | POA: Insufficient documentation

## 2015-03-21 DIAGNOSIS — E119 Type 2 diabetes mellitus without complications: Secondary | ICD-10-CM | POA: Diagnosis not present

## 2015-03-21 DIAGNOSIS — R079 Chest pain, unspecified: Secondary | ICD-10-CM | POA: Insufficient documentation

## 2015-03-21 DIAGNOSIS — Z951 Presence of aortocoronary bypass graft: Secondary | ICD-10-CM | POA: Diagnosis not present

## 2015-03-21 DIAGNOSIS — R42 Dizziness and giddiness: Secondary | ICD-10-CM | POA: Diagnosis not present

## 2015-03-21 DIAGNOSIS — I10 Essential (primary) hypertension: Secondary | ICD-10-CM

## 2015-03-21 DIAGNOSIS — R0600 Dyspnea, unspecified: Secondary | ICD-10-CM | POA: Insufficient documentation

## 2015-03-21 MED ORDER — REGADENOSON 0.4 MG/5ML IV SOLN
0.4000 mg | Freq: Once | INTRAVENOUS | Status: AC
  Administered 2015-03-21: 0.4 mg via INTRAVENOUS

## 2015-03-21 MED ORDER — AMINOPHYLLINE 25 MG/ML IV SOLN
75.0000 mg | Freq: Once | INTRAVENOUS | Status: AC
  Administered 2015-03-21: 75 mg via INTRAVENOUS

## 2015-03-21 MED ORDER — TECHNETIUM TC 99M SESTAMIBI GENERIC - CARDIOLITE
10.8000 | Freq: Once | INTRAVENOUS | Status: AC | PRN
  Administered 2015-03-21: 11 via INTRAVENOUS

## 2015-03-21 MED ORDER — TECHNETIUM TC 99M SESTAMIBI GENERIC - CARDIOLITE
30.5000 | Freq: Once | INTRAVENOUS | Status: AC | PRN
  Administered 2015-03-21: 31 via INTRAVENOUS

## 2015-03-22 ENCOUNTER — Encounter: Payer: Self-pay | Admitting: *Deleted

## 2015-03-22 DIAGNOSIS — E7800 Pure hypercholesterolemia, unspecified: Secondary | ICD-10-CM | POA: Insufficient documentation

## 2015-03-22 DIAGNOSIS — E1165 Type 2 diabetes mellitus with hyperglycemia: Secondary | ICD-10-CM | POA: Insufficient documentation

## 2015-03-22 LAB — MYOCARDIAL PERFUSION IMAGING
CHL CUP RESTING HR STRESS: 64 {beats}/min
LVDIAVOL: 74 mL
LVSYSVOL: 26 mL
Peak HR: 90 {beats}/min
SDS: 2
SRS: 2
SSS: 4
TID: 1.23

## 2015-03-22 LAB — VITAMIN D 1,25 DIHYDROXY
VITAMIN D 1, 25 (OH) TOTAL: 89 pg/mL — AB (ref 18–72)
VITAMIN D3 1, 25 (OH): 89 pg/mL
Vitamin D2 1, 25 (OH)2: <8 pg/mL

## 2015-05-23 ENCOUNTER — Encounter: Payer: Self-pay | Admitting: Cardiovascular Disease

## 2015-05-23 ENCOUNTER — Ambulatory Visit (INDEPENDENT_AMBULATORY_CARE_PROVIDER_SITE_OTHER): Payer: Medicare Other | Admitting: Cardiovascular Disease

## 2015-05-23 VITALS — BP 200/90 | HR 68 | Ht 63.5 in | Wt 193.2 lb

## 2015-05-23 DIAGNOSIS — I10 Essential (primary) hypertension: Secondary | ICD-10-CM

## 2015-05-23 DIAGNOSIS — E785 Hyperlipidemia, unspecified: Secondary | ICD-10-CM | POA: Diagnosis not present

## 2015-05-23 DIAGNOSIS — I2583 Coronary atherosclerosis due to lipid rich plaque: Secondary | ICD-10-CM

## 2015-05-23 DIAGNOSIS — I119 Hypertensive heart disease without heart failure: Secondary | ICD-10-CM | POA: Diagnosis not present

## 2015-05-23 DIAGNOSIS — E669 Obesity, unspecified: Secondary | ICD-10-CM | POA: Diagnosis not present

## 2015-05-23 DIAGNOSIS — I2510 Atherosclerotic heart disease of native coronary artery without angina pectoris: Secondary | ICD-10-CM

## 2015-05-23 MED ORDER — DILTIAZEM HCL ER COATED BEADS 120 MG PO CP24: 120.0000 mg | ORAL_CAPSULE | Freq: Every day | ORAL | Status: DC

## 2015-05-23 MED ORDER — ASPIRIN EC 81 MG PO TBEC: 81.0000 mg | DELAYED_RELEASE_TABLET | Freq: Every day | ORAL | Status: DC

## 2015-05-23 MED ORDER — SPIRONOLACTONE 25 MG PO TABS
12.5000 mg | ORAL_TABLET | Freq: Every day | ORAL | Status: DC
Start: 2015-05-23 — End: 2015-05-23

## 2015-05-23 MED ORDER — EZETIMIBE 10 MG PO TABS: 10.0000 mg | ORAL_TABLET | Freq: Every day | ORAL | Status: DC

## 2015-05-23 NOTE — Patient Instructions (Addendum)
Your physician recommends that you return for lab work in: 2 months fasting.  Your physician has recommended you make the following change in your medication:   1.) decrease the aspirin to 81 mg daily.  2.) start new prescriptions for diltiazem and zetia. These have already been sent to your pharmacy.  He also wants you to purchase some fish oil over the counter. Take 2 capsules daily.  Your physician recommends that you schedule a follow-up appointment in:  3 months with Dr. Claiborne Billings.

## 2015-05-24 ENCOUNTER — Telehealth: Payer: Self-pay

## 2015-05-24 ENCOUNTER — Telehealth: Payer: Self-pay | Admitting: Cardiovascular Disease

## 2015-05-24 NOTE — Telephone Encounter (Signed)
Dr. Claiborne Billings OV yesterday.  Started one of the new RX Cardizem at 8p last night.  BP this morning at eye doctor 243/108  Took Losartan and 1/2 magnesium pill when she got home  BP now 229/99.  She said it will bo back to her normal of 140/80 later on.   Said the [employer] of Cardizem had the opposite effect.  She has chemical intolerances to medications which causes her body to act opposite to the intended affect.  She said that she is going to stop Cardizem because it caused her blood pressure to go too high and made her sick to her stomach.  Has not started Zetia yet.  Did not want to start both at once because then she would not know which one she was reacting to.  I told her I would give Dr. Claiborne Billings the message and we would call her back with any changes.  Wants to make sure that stopping Cardizem after one dose there would not be a problem with abrupt discontinuation.

## 2015-05-24 NOTE — Telephone Encounter (Signed)
Routed to Dr. Claiborne Billings

## 2015-05-24 NOTE — Telephone Encounter (Signed)
Explained to patient that Dr. Claiborne Billings would like her to continue medication.  It takes 5 days for the medication to get therapeutic effect in her body.  She said , "No, it makes me sick and I am just going back to what I was taking'  Told her that her blood pressure was too high even though it has gone down to 187/99.  And she said, "No, I am not taking it and it is going down now".

## 2015-05-24 NOTE — Telephone Encounter (Signed)
Dr Claiborne Billings started her on a new medicine yesterday. Her blood pressure went up to 243/108.It is making her sick,please call asap.Blood pressure went down to 229/99.

## 2015-05-25 ENCOUNTER — Encounter: Payer: Self-pay | Admitting: Cardiovascular Disease

## 2015-05-25 DIAGNOSIS — I119 Hypertensive heart disease without heart failure: Secondary | ICD-10-CM | POA: Insufficient documentation

## 2015-05-25 NOTE — Progress Notes (Signed)
Patient ID: Ruth [name], female   DOB: [birth year], 69 y.o.   MRN: [id number]   Primary M.D.: Dr. Hardie Shackleton   HPI: Ruth [name] is a 69 year old female who established cardiology care with me 3 months ago.  She presents for follow-up cardiology evaluation.    Ruth [name] has a history of established coronary artery disease.  While living in [place] she developed chest discomfort and in 2004-11-07 underwent CABG surgery 4 at the [facility] in [place] by Dr. Marzetta Merino.  She had a LIMA to LAD, SVG to diagonal, SVG to obtuse marginal, and SVG to the RCA.   Her husband died in 11/07/10 and she moved to the [place] area.  In 2011/11/08, she experienced symptoms of increasing shortness of breath.  She underwent cardiac catheterization and a stent was placed in the vein graft to her RCA.  She states that she has not had any subsequent stress testing.  She is a former patient of Dr. Einar [name] and an echocardiogram in July 2014 showed normal LV size and function with mild concentric left ventricular hypertrophy.  She had mild to moderate mitral regurgitation, mild tricuspid regurgitation, and mild primary hypertension with an estimated PA pressure 39 mm.  Recently, she has noticed some left-sided chest pain and also admits to having gas in her left upper quadrant.  She has a history of hypertension for at least 15-20 years.  She states her blood pressure at home typically is in the 145-150 range, but her blood pressure is always elevated when she goes to the doctor's office.  She has a history of hyperlipidemia and apparently has been intolerant to statins but has never tried Zetia.  Is a history of diabetes mellitus but is not on therapy and states this is diet controlled.    She admits to recent increased stress.  Her mother died in Dec 06, 2014 and recently her dog died.  She does not routinely exercise.  When I initially saw her in June 2016 she has been taking amlodipine 5 mg, losartan 50  mg twice a day and metoprolol 50 mrem twice a day for hypertension and CAD.  She has a history of multiple allergies.  She states that she is Intolerant.  In the past.  She could not take amlodipine as well as Spironolactone.  She has been having labile blood pressure.  In the past. She became dehydrated on diarrhetic therapy.  Since I last saw her, she was taken off amlodipine by Dr. Joylene Draft.  She underwent an echo Doppler study on 03/13/2015 which showed an ejection fraction at 55-60%.  There was mild mitral regurgitation, mild left atrial dilatation, and very mild pulmonary hypertension with an estimated PA pressure 32 mm.  A nuclear perfusion study done on 03/21/2015 was low risk and demonstrated a very small region of mild ischemia in the basal anterolateral wall.  She had normal LV function with an EF of 65%.  Presently, she denies chest pain.  Laboratory 2 months ago revealed increased serum glucose.  Her lipids were elevated with a total cholesterol 244, triglycerides 262, HDL 29, and LDL 163.  She presents for evaluation.   Past Medical History  Diagnosis Date  . CAD (coronary artery disease)     a. CABG x 4 in 2004/11/07 in [place]. b. DES to midbody of SVG-PDA/PLA 07/2012.  . Labile hypertension   . Dyslipidemia     Intolerant of statins  . Diverticulosis   . IBS (irritable bowel  syndrome)   . Obesity   . Hyperplastic colon polyp   . Fecal incontinence   . Multiple allergies   . Anxiety   . Obesity   . Complication of anesthesia     "quit breathing when I got ?Inovar" (07/28/2012)  . Steatohepatitis   . Migraines     "had them in my 30's" (07/28/2012)  . GERD (gastroesophageal reflux disease)   . Type II diabetes mellitus     no meds, diet controlled  . Hyperlipidemia     Past Surgical History  Procedure Laterality Date  . Inguinal hernia repair  1970's?    left  . Excisional hemorrhoidectomy  1970's  . Breast lumpectomy      bilateral  . Cardiac catheterization   2006  . Coronary angioplasty with stent placement  07/28/2012    "1; first time for me" (07/28/2012)  . Coronary artery bypass graft  2006    CABG X4  . Abdominal hysterectomy  1970's  . Tonsillectomy and adenoidectomy  ~ 1951  . Left heart catheterization with coronary/graft angiogram N/A 07/28/2012    Procedure: LEFT HEART CATHETERIZATION WITH Beatrix Fetters;  Surgeon: Burnell Blanks, MD;  Location: [facility] CATH LAB;  Service: Cardiovascular;  Laterality: N/A;  . Percutaneous coronary stent intervention (pci-s)  07/28/2012    Procedure: PERCUTANEOUS CORONARY STENT INTERVENTION (PCI-S);  Surgeon: Burnell Blanks, MD;  Location: [facility] CATH LAB;  Service: Cardiovascular;;    Allergies  Allergen Reactions  . Betadine [Povidone Iodine] Swelling  . Codeine Anaphylaxis and Other (See Comments)    "quit breathing" (07/28/2012)  . Demerol Other (See Comments)    "quit breathing" (07/28/2012)  . Latex Other (See Comments)    "quit breathing" (07/28/2012)  . Other Other (See Comments)    Perfume, Any Fragrance. "quit breathing" (07/28/2012)  . Percocet [Oxycodone-Acetaminophen] Other (See Comments)    "quit breathing; disoriented" (07/28/2012)  . Plavix [Clopidogrel Bisulfate] Other (See Comments)    "get hot; like I'm burning up inside; had to put me in shower after OHS because of that" (07/28/2012)  . Red Dye Other (See Comments)    "quit breathing" (07/28/2012)  . Shrimp Flavor Shortness Of Breath and Other (See Comments)    "broke out in knots all over" (07/28/2012)  . Shrimp [Shellfish Allergy] Shortness Of Breath and Other (See Comments)    "broke out in knots all over" (07/28/2012)  . Sulfonamide Derivatives Rash and Other (See Comments)    "quit breathing" (07/28/2012)  . Tylenol [Acetaminophen] Shortness Of Breath  . Iohexol     Unknown    Current Outpatient Prescriptions  Medication Sig Dispense Refill  . calcium carbonate (TUMS - DOSED IN MG ELEMENTAL  CALCIUM) 500 MG chewable tablet Chew 1 tablet by mouth 2 (two) times daily as needed. For heartburn    . cholecalciferol (VITAMIN D) 1000 UNITS tablet Take 1,000 Units by mouth daily.    . Cranberry-Vitamin C-Vitamin E (CRANBERRY PLUS VITAMIN C) 4200-20-3 MG-MG-UNIT CAPS Take 1 capsule by mouth daily.    Marland Kitchen losartan (COZAAR) 100 MG tablet Take 50 mg by mouth 2 (two) times daily.  0  . Magnesium 250 MG TABS Take 0.5 tablets by mouth 2 (two) times daily.    . metoprolol (LOPRESSOR) 100 MG tablet Take 1 tablet by mouth 2 (two) times daily.  0  . nitroGLYCERIN (NITROSTAT) 0.4 MG SL tablet Place 1 tablet (0.4 mg total) under the tongue every 5 (five) minutes as needed for chest pain (up  to 3 doses). 25 tablet 4  . vitamin B-12 (CYANOCOBALAMIN) 1000 MCG tablet Take 1,000 mcg by mouth daily.    Marland Kitchen aspirin EC 81 MG tablet Take 1 tablet (81 mg total) by mouth daily. 90 tablet 3  . diltiazem (CARDIZEM CD) 120 MG 24 hr capsule Take 1 capsule (120 mg total) by mouth at bedtime. 30 capsule 6  . ezetimibe (ZETIA) 10 MG tablet Take 1 tablet (10 mg total) by mouth daily. 30 tablet 6   No current facility-administered medications for this visit.    Social History   Social History  . Marital Status: Widowed    Spouse Name: N/A  . Number of Children: N/A  . Years of Education: N/A   Occupational History  . Not on file.   Social History Main Topics  . Smoking status: Never Smoker   . Smokeless tobacco: Never Used  . Alcohol Use: No  . Drug Use: No  . Sexual Activity: No   Other Topics Concern  . Not on file   Social History Narrative   Social history is notable that she is widowed.  She has 2 children, ages 33 and 55.  There is no tobacco use.  She does not routinely exercise.  Family History  Problem Relation Age of Onset  . Coronary artery disease Father   . Colon cancer Father   . Colon polyps Father   . Heart disease Father   . Hypertension    . Breast cancer      grandmother  .  Diabetes Maternal Grandmother   . Diabetes Paternal Grandmother   . Stroke Mother   . Esophageal cancer Neg Hx   . Rectal cancer Neg Hx   . Stomach cancer Neg Hx    Family history is notable in that her father has heart disease and is 101 years old.  Her mother died at age 18 with a stroke.  She has 2 sisters, ages 29 and 69.  ROS General: Negative; No fevers, chills, or night sweats HEENT: Negative; No changes in vision or hearing, sinus congestion, difficulty swallowing Pulmonary: Negative; No cough, wheezing, shortness of breath, hemoptysis Cardiovascular: See HPI:  GI: Positive for left upper quadrant pain which he describes as "gas." GU: Negative; No dysuria, hematuria, or difficulty voiding Musculoskeletal: Negative; no myalgias, joint pain, or weakness Hematologic: Negative; no easy bruising, bleeding Endocrine: Positive for diabetes mellitus Neuro: Negative; no changes in balance, headaches Skin: Negative; No rashes or skin lesions Psychiatric: Negative; No behavioral problems, depression Sleep: Negative; No snoring,  daytime sleepiness, hypersomnolence, bruxism, restless legs, hypnogognic hallucinations. Other comprehensive 14 point system review is negative   Physical Exam BP 200/90 mmHg  Pulse 68  Ht 5' 3.5" (1.613 m)  Wt 193 lb 3.2 oz (87.635 kg)  BMI 33.68 kg/m2  Repeat blood pressure by me was 188/90  Wt Readings from Last 3 Encounters:  05/23/15 193 lb 3.2 oz (87.635 kg)  03/21/15 192 lb (87.091 kg)  03/04/15 192 lb 8 oz (87.317 kg)   General: Alert, oriented, no distress.  Skin: normal turgor, no rashes, warm and dry HEENT: Normocephalic, atraumatic. Pupils equal round and reactive to light; sclera anicteric; extraocular muscles intact, No lid lag; Nose without nasal septal hypertrophy; Mouth/Parynx benign; Mallinpatti scale 3 Neck: No JVD, no carotid bruits; normal carotid upstroke Lungs: clear to ausculatation and percussion bilaterally; no wheezing or  rales, normal inspiratory and expiratory effort Chest wall: without tenderness to palpitation Heart: PMI not displaced, RRR, s1  s2 normal, 5-4/5 systolic murmur, No diastolic murmur, no rubs, gallops, thrills, or heaves Abdomen: soft, nontender; no hepatosplenomehaly, BS+; abdominal aorta nontender and not dilated by palpation. Back: no CVA tenderness Pulses: 2+  Musculoskeletal: full range of motion, normal strength, no joint deformities Extremities: Pulses 2+, no clubbing cyanosis or edema, Homan's sign negative  Neurologic: grossly nonfocal; Cranial nerves grossly wnl Psychologic: Normal mood and affect   03/04/2015 ECG (independently read by me): Normal sinus rhythm at 65 bpm.  Mild T wave abnormality in lead 1 and aVL.  LABS:  BMP Latest Ref Rng 03/19/2015 02/15/2014 02/14/2014  Glucose 70 - 99 mg/dL 197(H) 159(H) 178(H)  BUN 6 - 23 mg/dL _0 Creatinine 0.50 - 1.10 mg/dL 0.59 0.57 0.59  Sodium 135 - 145 mEq/L 139 140 142  Potassium 3.5 - 5.3 mEq/L 4.6 4.6 4.6  Chloride 96 - 112 mEq/L 100 102 103  CO2 19 - 32 mEq/L _1 Calcium 8.4 - 10.5 mg/dL 9.5 8.7 8.9     Hepatic Function Latest Ref Rng 03/19/2015 02/12/2014 07/07/2012  Total Protein 6.0 - 8.3 g/dL 6.9 8.0 7.6  Albumin 3.5 - 5.2 g/dL 4.1 4.1 4.0  AST 0 - 37 U/L _2 ALT 0 - 35 U/L _3 Alk Phosphatase 39 - 117 U/L 64 61 68  Total Bilirubin 0.2 - 1.2 mg/dL 0.7 1.5(H) 0.5  Bilirubin, Direct 0.0 - 0.3 mg/dL - - -    CBC Latest Ref Rng 03/19/2015 02/15/2014 02/14/2014  WBC 4.0 - 10.5 K/uL 6.0 8.2 8.9  Hemoglobin 12.0 - 15.0 g/dL 14.1 12.6 13.0  Hematocrit 36.0 - 46.0 % 42.1 38.4 39.6  Platelets 150 - 400 K/uL 267 210 181   Lab Results  Component Value Date   MCV 91.9 03/19/2015   MCV 93.7 02/15/2014   MCV 94.7 02/14/2014    Lab Results  Component Value Date   TSH 1.987 03/19/2015    BNP No results found for: BNP  ProBNP No results found for: PROBNP   Lipid Panel     Component Value  Date/Time   CHOL 244* 03/19/2015 0812   TRIG 262* 03/19/2015 0812   HDL 29* 03/19/2015 0812   CHOLHDL 8.4 03/19/2015 0812   VLDL 52* 03/19/2015 0812   LDLCALC 163* 03/19/2015 0812   LDLDIRECT 160.9 06/21/2012 0833     RADIOLOGY: No results found.    ASSESSMENT AND PLAN: Ruth. Ruth [name] is a 69 year old female who is 10 years status post CABG revascularization surgery 4, which was done at the [facility] in [place], [place].  She is status post DES stenting to the vein graft supplying her RCA territory.  When I last saw her, she was experiencing vague left-sided chest discomfort which was not exertionally precipitated.  An echo Doppler study was done which showed normal systolic function without wall motion abnormalities.  Nuclear perfusion imaging showed only a minimal region (extent 5%, TPD 4%) of mild ischemia in the basal inferolateral wall.  I reviewed her lipid studies with her in detail.  She is completely against initiation of statin therapy.  I again have recommended at least adding fish oil over-the-counter, as well as Zetia 10 mg to her medical regimen.  She has stage II hypertension.  She has numerous intolerances apparently recently did not tolerate amlodipine and apparently cannot tolerate diarrhetic's or spironolactone.  I will try starting her on diltiazem, initially at 120 mg at bedtime to see if  this can improve some of her symptoms.  She does have some increased gas and discomfort at the left splenic flexure but I do not believe this is cardiac in nature.  She will continue taking her losartan 100 mg daily.  He 59 g twice a day regimen as well as metoprolol 100 mg twice a day.  She will monitor her blood pressure.  I suggested she reduce her aspirin from 325 mg to 81 mg.  Follow-up laboratory will be obtained in approximate 60 weeks.  I will see her in 3 months for reevaluation.   Time spent: 30 minutes  Troy Sine, MD, [facility]  05/25/2015 10:05 PM

## 2015-08-26 ENCOUNTER — Ambulatory Visit: Payer: Medicare Other | Admitting: Cardiovascular Disease

## 2015-09-07 ENCOUNTER — Encounter (HOSPITAL_COMMUNITY): Payer: Self-pay

## 2015-09-07 ENCOUNTER — Emergency Department (HOSPITAL_COMMUNITY)
Admission: EM | Admit: 2015-09-07 | Discharge: 2015-09-07 | Disposition: A | Discharge status: Home / Self Care | Payer: Medicare Other | Attending: Emergency Medicine | Admitting: Emergency Medicine

## 2015-09-07 DIAGNOSIS — Z8659 Personal history of other mental and behavioral disorders: Secondary | ICD-10-CM | POA: Diagnosis not present

## 2015-09-07 DIAGNOSIS — Z91040 Latex allergy status: Secondary | ICD-10-CM | POA: Diagnosis not present

## 2015-09-07 DIAGNOSIS — Z86010 Personal history of colonic polyps: Secondary | ICD-10-CM | POA: Diagnosis not present

## 2015-09-07 DIAGNOSIS — Z79899 Other long term (current) drug therapy: Secondary | ICD-10-CM | POA: Insufficient documentation

## 2015-09-07 DIAGNOSIS — I2510 Atherosclerotic heart disease of native coronary artery without angina pectoris: Secondary | ICD-10-CM | POA: Insufficient documentation

## 2015-09-07 DIAGNOSIS — E119 Type 2 diabetes mellitus without complications: Secondary | ICD-10-CM | POA: Diagnosis not present

## 2015-09-07 DIAGNOSIS — Z951 Presence of aortocoronary bypass graft: Secondary | ICD-10-CM | POA: Insufficient documentation

## 2015-09-07 DIAGNOSIS — E785 Hyperlipidemia, unspecified: Secondary | ICD-10-CM | POA: Insufficient documentation

## 2015-09-07 DIAGNOSIS — E669 Obesity, unspecified: Secondary | ICD-10-CM | POA: Insufficient documentation

## 2015-09-07 DIAGNOSIS — M542 Cervicalgia: Secondary | ICD-10-CM | POA: Diagnosis present

## 2015-09-07 DIAGNOSIS — Y9389 Activity, other specified: Secondary | ICD-10-CM | POA: Diagnosis not present

## 2015-09-07 DIAGNOSIS — Z7982 Long term (current) use of aspirin: Secondary | ICD-10-CM | POA: Diagnosis not present

## 2015-09-07 DIAGNOSIS — Z9889 Other specified postprocedural states: Secondary | ICD-10-CM | POA: Diagnosis not present

## 2015-09-07 DIAGNOSIS — Y998 Other external cause status: Secondary | ICD-10-CM | POA: Diagnosis not present

## 2015-09-07 DIAGNOSIS — I10 Essential (primary) hypertension: Secondary | ICD-10-CM | POA: Insufficient documentation

## 2015-09-07 DIAGNOSIS — Y9289 Other specified places as the place of occurrence of the external cause: Secondary | ICD-10-CM | POA: Insufficient documentation

## 2015-09-07 DIAGNOSIS — X58XXXA Exposure to other specified factors, initial encounter: Secondary | ICD-10-CM | POA: Insufficient documentation

## 2015-09-07 DIAGNOSIS — K219 Gastro-esophageal reflux disease without esophagitis: Secondary | ICD-10-CM | POA: Diagnosis not present

## 2015-09-07 DIAGNOSIS — Z9861 Coronary angioplasty status: Secondary | ICD-10-CM | POA: Insufficient documentation

## 2015-09-07 DIAGNOSIS — S46811A Strain of other muscles, fascia and tendons at shoulder and upper arm level, right arm, initial encounter: Secondary | ICD-10-CM | POA: Insufficient documentation

## 2015-09-07 NOTE — ED Provider Notes (Signed)
CSN: [id number]     Arrival date & time 09/07/15  5573 History   First MD Initiated Contact with Patient 09/07/15 [phone]     Chief Complaint  Patient presents with  . Neck Pain     (Consider location/radiation/quality/duration/timing/severity/associated sxs/prior Treatment) HPI Comments: 69yo F w/ PMH including CAD s/p CABG, HTN, HLD, T2DM, anxiety who p/w neck pain. Patient began having right upper neck pain starting yesterday which has been severe and worse with any movement. She denies any trauma or recent falls. No change in physical activity. No numbness or tingling in her arms or upper extremity weakness. No problems walking. She states that she's had a mild headache related to the neck pain but denies any visual changes. She does admit to being very stressed recently as she is caring for her father who has metastatic cancer and her mom recently died. She has taken aspirin a few times yesterday without much relief.  Patient is a 69 y.o. female presenting with neck pain. The history is provided by the patient.  Neck Pain   Past Medical History  Diagnosis Date  . CAD (coronary artery disease)     a. CABG x 4 in 2006 in [place]. b. DES to midbody of SVG-PDA/PLA 07/2012.  . Labile hypertension   . Dyslipidemia     Intolerant of statins  . Diverticulosis   . IBS (irritable bowel syndrome)   . Obesity   . Hyperplastic colon polyp   . Fecal incontinence   . Multiple allergies   . Anxiety   . Obesity   . Complication of anesthesia     "quit breathing when I got ?Inovar" (07/28/2012)  . Steatohepatitis   . Migraines     "had them in my 30's" (07/28/2012)  . GERD (gastroesophageal reflux disease)   . Type II diabetes mellitus (HCC)     no meds, diet controlled  . Hyperlipidemia    Past Surgical History  Procedure Laterality Date  . Inguinal hernia repair  1970's?    left  . Excisional hemorrhoidectomy  1970's  . Breast lumpectomy      bilateral  . Cardiac  catheterization  2006  . Coronary angioplasty with stent placement  07/28/2012    "1; first time for me" (07/28/2012)  . Coronary artery bypass graft  2006    CABG X4  . Abdominal hysterectomy  1970's  . Tonsillectomy and adenoidectomy  ~ 1951  . Left heart catheterization with coronary/graft angiogram N/A 07/28/2012    Procedure: LEFT HEART CATHETERIZATION WITH Beatrix Fetters;  Surgeon: Burnell Blanks, MD;  Location: [facility] CATH LAB;  Service: Cardiovascular;  Laterality: N/A;  . Percutaneous coronary stent intervention (pci-s)  07/28/2012    Procedure: PERCUTANEOUS CORONARY STENT INTERVENTION (PCI-S);  Surgeon: Burnell Blanks, MD;  Location: [facility] CATH LAB;  Service: Cardiovascular;;   Family History  Problem Relation Age of Onset  . Coronary artery disease Father   . Colon cancer Father   . Colon polyps Father   . Heart disease Father   . Hypertension    . Breast cancer      grandmother  . Diabetes Maternal Grandmother   . Diabetes Paternal Grandmother   . Stroke Mother   . Esophageal cancer Neg Hx   . Rectal cancer Neg Hx   . Stomach cancer Neg Hx    Social History  Substance Use Topics  . Smoking status: Never Smoker   . Smokeless tobacco: Never Used  . Alcohol Use: No  OB History    No data available     Review of Systems  Musculoskeletal: Positive for neck pain.   10 Systems reviewed and are negative for acute change except as noted in the HPI.    Allergies  Betadine; Codeine; Demerol; Latex; Other; Percocet; Plavix; Red dye; Shrimp; Shrimp flavor; Sulfonamide derivatives; Tylenol; and Iohexol  Home Medications   Prior to Admission medications   Medication Sig Start Date End Date Taking? Authorizing Provider  aspirin EC 81 MG tablet Take 1 tablet (81 mg total) by mouth daily. 05/23/15   Troy Sine, MD  calcium carbonate (TUMS - DOSED IN MG ELEMENTAL CALCIUM) 500 MG chewable tablet Chew 1 tablet by mouth 2 (two) times daily as needed.  For heartburn    Historical Provider, MD  cholecalciferol (VITAMIN D) 1000 UNITS tablet Take 1,000 Units by mouth daily.    Historical Provider, MD  Cranberry-Vitamin C-Vitamin E (CRANBERRY PLUS VITAMIN C) 4200-20-3 MG-MG-UNIT CAPS Take 1 capsule by mouth daily.    Historical Provider, MD  diltiazem (CARDIZEM CD) 120 MG 24 hr capsule Take 1 capsule (120 mg total) by mouth at bedtime. 05/23/15   Troy Sine, MD  ezetimibe (ZETIA) 10 MG tablet Take 1 tablet (10 mg total) by mouth daily. 05/23/15   Troy Sine, MD  losartan (COZAAR) 100 MG tablet Take 50 mg by mouth 2 (two) times daily. 01/03/15   Historical Provider, MD  Magnesium 250 MG TABS Take 0.5 tablets by mouth 2 (two) times daily.    Historical Provider, MD  metoprolol (LOPRESSOR) 100 MG tablet Take 1 tablet by mouth 2 (two) times daily. 05/07/15   Historical Provider, MD  nitroGLYCERIN (NITROSTAT) 0.4 MG SL tablet Place 1 tablet (0.4 mg total) under the tongue every 5 (five) minutes as needed for chest pain (up to 3 doses). 07/29/12   Dayna N Dunn, PA-C  vitamin B-12 (CYANOCOBALAMIN) 1000 MCG tablet Take 1,000 mcg by mouth daily.    Historical Provider, MD   BP 181/85 mmHg  Pulse 73  Temp(Src) 98.8 F (37.1 C) (Oral)  Resp 16  SpO2 98% Physical Exam  Constitutional: She is oriented to person, place, and time. She appears well-developed and well-nourished. No distress.  HENT:  Head: Normocephalic and atraumatic.  Moist mucous membranes  Eyes: Conjunctivae are normal. Pupils are equal, round, and reactive to light.  Neck:  Limited right-sided rotation of the neck secondary to pain, no meningismus  Cardiovascular: Normal rate, regular rhythm and normal heart sounds.   No murmur heard. Pulmonary/Chest: Effort normal and breath sounds normal.  Abdominal: Soft. Bowel sounds are normal. She exhibits no distension. There is no tenderness.  Musculoskeletal:  Tenderness to palpation of the right trapezius muscle, 5/5 strength and normal  sensation bilateral upper extremities  Neurological: She is alert and oriented to person, place, and time.  Fluent speech  Skin: Skin is warm and dry.  Psychiatric: Judgment normal.  Anxious but pleasant  Nursing note and vitals reviewed.   ED Course  Procedures (including critical care time) Labs Review Labs Reviewed - No data to display   MDM   Final diagnoses:  Trapezius strain, right, initial encounter   patient with posterior right-sided neck/shoulder pain that began yesterday without any history of trauma. Patient anxious but well-appearing at presentation. She was hypertensive at triage but states that she suspects this is because of pain and anxiety. Tenderness to palpation of right trapezius muscle consistent with muscle strain/cramping. No upper extremity weakness  or altered sensation to suggest cervical spine problem. Structure on supportive care including heat therapy and cautioned on judicious use of aspirin for only a few days. I discussed risks and benefits of muscle relaxant and patient declined stating that she is allergic to a lot of medications and it was not worth the risk. Instructed to follow-up with PCP in one week if not improved. Patient voiced understanding and was discharged in satisfactory condition.  Sharlett Iles, MD 09/07/15 [phone]

## 2015-09-07 NOTE — ED Notes (Signed)
Per pt, neck pain bilateral back and going to shoulders.  Pain started yesterday.  Denies trauma.  Pt cannot recall if pain came on sudden or woke up with it. Pt very stressed.  Looking after Dad and mom has passed.

## 2015-09-07 NOTE — Discharge Instructions (Signed)

## 2015-09-08 ENCOUNTER — Emergency Department (HOSPITAL_COMMUNITY)
Admission: EM | Admit: 2015-09-08 | Discharge: 2015-09-08 | Disposition: A | Discharge status: Home / Self Care | Payer: Medicare Other | Attending: Emergency Medicine | Admitting: Emergency Medicine

## 2015-09-08 ENCOUNTER — Encounter (HOSPITAL_COMMUNITY): Payer: Self-pay | Admitting: Emergency Medicine

## 2015-09-08 DIAGNOSIS — N390 Urinary tract infection, site not specified: Secondary | ICD-10-CM

## 2015-09-08 DIAGNOSIS — E785 Hyperlipidemia, unspecified: Secondary | ICD-10-CM | POA: Insufficient documentation

## 2015-09-08 DIAGNOSIS — Z951 Presence of aortocoronary bypass graft: Secondary | ICD-10-CM | POA: Diagnosis not present

## 2015-09-08 DIAGNOSIS — Z91040 Latex allergy status: Secondary | ICD-10-CM | POA: Insufficient documentation

## 2015-09-08 DIAGNOSIS — E119 Type 2 diabetes mellitus without complications: Secondary | ICD-10-CM | POA: Insufficient documentation

## 2015-09-08 DIAGNOSIS — E669 Obesity, unspecified: Secondary | ICD-10-CM | POA: Diagnosis not present

## 2015-09-08 DIAGNOSIS — Z7982 Long term (current) use of aspirin: Secondary | ICD-10-CM | POA: Insufficient documentation

## 2015-09-08 DIAGNOSIS — Z86010 Personal history of colonic polyps: Secondary | ICD-10-CM | POA: Insufficient documentation

## 2015-09-08 DIAGNOSIS — K219 Gastro-esophageal reflux disease without esophagitis: Secondary | ICD-10-CM | POA: Insufficient documentation

## 2015-09-08 DIAGNOSIS — I2510 Atherosclerotic heart disease of native coronary artery without angina pectoris: Secondary | ICD-10-CM | POA: Diagnosis not present

## 2015-09-08 DIAGNOSIS — Z9889 Other specified postprocedural states: Secondary | ICD-10-CM | POA: Insufficient documentation

## 2015-09-08 DIAGNOSIS — Z9861 Coronary angioplasty status: Secondary | ICD-10-CM | POA: Insufficient documentation

## 2015-09-08 DIAGNOSIS — R300 Dysuria: Secondary | ICD-10-CM | POA: Diagnosis present

## 2015-09-08 DIAGNOSIS — Z79899 Other long term (current) drug therapy: Secondary | ICD-10-CM | POA: Insufficient documentation

## 2015-09-08 DIAGNOSIS — Z8659 Personal history of other mental and behavioral disorders: Secondary | ICD-10-CM | POA: Diagnosis not present

## 2015-09-08 DIAGNOSIS — I10 Essential (primary) hypertension: Secondary | ICD-10-CM | POA: Diagnosis not present

## 2015-09-08 DIAGNOSIS — R110 Nausea: Secondary | ICD-10-CM | POA: Diagnosis not present

## 2015-09-08 LAB — URINALYSIS, ROUTINE W REFLEX MICROSCOPIC
BILIRUBIN URINE: NEGATIVE
Glucose, UA: 100 mg/dL — AB
Hgb urine dipstick: NEGATIVE
KETONES UR: NEGATIVE mg/dL
NITRITE: NEGATIVE
PROTEIN: NEGATIVE mg/dL
SPECIFIC GRAVITY, URINE: 1.012 (ref 1.005–1.030)
pH: 5.5 (ref 5.0–8.0)

## 2015-09-08 LAB — URINE MICROSCOPIC-ADD ON: RBC / HPF: NONE SEEN RBC/hpf (ref 0–5)

## 2015-09-08 MED ORDER — AMOXICILLIN-POT CLAVULANATE 875-125 MG PO TABS
1.0000 | ORAL_TABLET | Freq: Once | ORAL | Status: AC
  Administered 2015-09-08: 1 via ORAL
  Filled 2015-09-08: qty 1

## 2015-09-08 MED ORDER — AMOXICILLIN-POT CLAVULANATE 875-125 MG PO TABS: 1.0000 | ORAL_TABLET | Freq: Two times a day (BID) | ORAL | Status: DC

## 2015-09-08 NOTE — ED Notes (Signed)
Patient was alert, oriented and stable upon discharge. RN went over AVS and patient had no further questions. Pt was monitored for reaction to medication before leaving. No signs of allergic reaction prior to discharge.

## 2015-09-08 NOTE — ED Notes (Signed)
Pt states that she has had oliguria and lower abdominal pain x 2 days. Feels like she may have a UTI. Alert and oriented.

## 2015-09-08 NOTE — ED Provider Notes (Signed)
CSN: [id number]     Arrival date & time 09/08/15  1546 History   First MD Initiated Contact with Patient 09/08/15 1755     Chief Complaint  Patient presents with  . Dysuria     (Consider location/radiation/quality/duration/timing/severity/associated sxs/prior Treatment) Patient is a 70 y.o. female presenting with dysuria.  Dysuria Pain quality:  Sharp and aching Pain severity:  Mild Onset quality:  Gradual Timing:  Constant Progression:  Worsening Chronicity:  New Recent urinary tract infections: no   Relieved by:  None tried Worsened by:  Nothing tried Ineffective treatments:  None tried Urinary symptoms: discolored urine and foul-smelling urine   Urinary symptoms: no frequent urination   Associated symptoms: nausea   Associated symptoms: no abdominal pain and no vomiting   Risk factors: recurrent urinary tract infections     Past Medical History  Diagnosis Date  . CAD (coronary artery disease)     a. CABG x 4 in 2006 in [place]. b. DES to midbody of SVG-PDA/PLA 07/2012.  . Labile hypertension   . Dyslipidemia     Intolerant of statins  . Diverticulosis   . IBS (irritable bowel syndrome)   . Obesity   . Hyperplastic colon polyp   . Fecal incontinence   . Multiple allergies   . Anxiety   . Obesity   . Complication of anesthesia     "quit breathing when I got ?Inovar" (07/28/2012)  . Steatohepatitis   . Migraines     "had them in my 30's" (07/28/2012)  . GERD (gastroesophageal reflux disease)   . Type II diabetes mellitus (HCC)     no meds, diet controlled  . Hyperlipidemia    Past Surgical History  Procedure Laterality Date  . Inguinal hernia repair  1970's?    left  . Excisional hemorrhoidectomy  1970's  . Breast lumpectomy      bilateral  . Cardiac catheterization  2006  . Coronary angioplasty with stent placement  07/28/2012    "1; first time for me" (07/28/2012)  . Coronary artery bypass graft  2006    CABG X4  . Abdominal hysterectomy   1970's  . Tonsillectomy and adenoidectomy  ~ 1951  . Left heart catheterization with coronary/graft angiogram N/A 07/28/2012    Procedure: LEFT HEART CATHETERIZATION WITH Beatrix Fetters;  Surgeon: Burnell Blanks, MD;  Location: [facility] CATH LAB;  Service: Cardiovascular;  Laterality: N/A;  . Percutaneous coronary stent intervention (pci-s)  07/28/2012    Procedure: PERCUTANEOUS CORONARY STENT INTERVENTION (PCI-S);  Surgeon: Burnell Blanks, MD;  Location: [facility] CATH LAB;  Service: Cardiovascular;;   Family History  Problem Relation Age of Onset  . Coronary artery disease Father   . Colon cancer Father   . Colon polyps Father   . Heart disease Father   . Hypertension    . Breast cancer      grandmother  . Diabetes Maternal Grandmother   . Diabetes Paternal Grandmother   . Stroke Mother   . Esophageal cancer Neg Hx   . Rectal cancer Neg Hx   . Stomach cancer Neg Hx    Social History  Substance Use Topics  . Smoking status: Never Smoker   . Smokeless tobacco: Never Used  . Alcohol Use: No   OB History    No data available     Review of Systems  Constitutional: Negative for chills.  HENT: Negative for congestion.   Eyes: Negative for pain.  Respiratory: Negative for cough and shortness of breath.  Cardiovascular: Negative for chest pain and palpitations.  Gastrointestinal: Positive for nausea. Negative for vomiting and abdominal pain.  Genitourinary: Positive for dysuria. Negative for hematuria and enuresis.  Musculoskeletal: Negative for myalgias and back pain.  All other systems reviewed and are negative.     Allergies  Betadine; Codeine; Demerol; Latex; Other; Percocet; Plavix; Red dye; Shrimp; Shrimp flavor; Sulfonamide derivatives; Tylenol; and Iohexol  Home Medications   Prior to Admission medications   Medication Sig Start Date End Date Taking? Authorizing Provider  aspirin 325 MG EC tablet Take 325 mg by mouth daily.   Yes Historical  Provider, MD  calcium carbonate (TUMS - DOSED IN MG ELEMENTAL CALCIUM) 500 MG chewable tablet Chew 1 tablet by mouth 2 (two) times daily as needed. For heartburn   Yes Historical Provider, MD  cholecalciferol (VITAMIN D) 1000 UNITS tablet Take 1,000 Units by mouth daily.   Yes Historical Provider, MD  Cranberry-Vitamin C-Vitamin E (CRANBERRY PLUS VITAMIN C) 4200-20-3 MG-MG-UNIT CAPS Take 1 capsule by mouth daily.   Yes Historical Provider, MD  losartan (COZAAR) 50 MG tablet Take 50 mg by mouth 2 (two) times daily. 09/07/15  Yes Historical Provider, MD  Magnesium 250 MG TABS Take 0.5 tablets by mouth 2 (two) times daily.   Yes Historical Provider, MD  metoprolol (LOPRESSOR) 100 MG tablet Take 1 tablet by mouth 2 (two) times daily. 05/07/15  Yes Historical Provider, MD  mupirocin ointment (BACTROBAN) 2 % Apply 1 application topically 3 (three) times daily as needed (rash).  08/14/15  Yes Historical Provider, MD  Naftifine HCl 2 % CREA Apply 1 application topically 3 (three) times daily as needed (rash).  08/07/15  Yes Historical Provider, MD  nitroGLYCERIN (NITROSTAT) 0.4 MG SL tablet Place 1 tablet (0.4 mg total) under the tongue every 5 (five) minutes as needed for chest pain (up to 3 doses). 07/29/12  Yes Dayna N Dunn, PA-C  Nystatin (NYAMYC) 100000 UNIT/GM POWD Apply 1 application topically daily as needed (rash).  08/14/15  Yes Historical Provider, MD  vitamin B-12 (CYANOCOBALAMIN) 1000 MCG tablet Take 1,000 mcg by mouth daily.   Yes Historical Provider, MD  amoxicillin-clavulanate (AUGMENTIN) 875-125 MG tablet Take 1 tablet by mouth 2 (two) times daily. 09/08/15   Merrily Pew, MD  aspirin EC 81 MG tablet Take 1 tablet (81 mg total) by mouth daily. 05/23/15   Troy Sine, MD  diltiazem (CARDIZEM CD) 120 MG 24 hr capsule Take 1 capsule (120 mg total) by mouth at bedtime. 05/23/15   Troy Sine, MD  ezetimibe (ZETIA) 10 MG tablet Take 1 tablet (10 mg total) by mouth daily. 05/23/15   Troy Sine,  MD   BP 184/91 mmHg  Pulse 82  Temp(Src) 98.4 F (36.9 C) (Oral)  Resp 18  SpO2 96% Physical Exam  Constitutional: She is oriented to person, place, and time. She appears well-developed and well-nourished.  HENT:  Head: Normocephalic and atraumatic.  Neck: Normal range of motion.  Cardiovascular: Normal rate and regular rhythm.   Pulmonary/Chest: Effort normal and breath sounds normal. No stridor. No respiratory distress.  Abdominal: Soft. She exhibits no distension. There is tenderness (slightly, suprapubic).  Musculoskeletal: Normal range of motion. She exhibits no edema or tenderness.  Neurological: She is alert and oriented to person, place, and time. No cranial nerve deficit. Coordination normal.  Skin: Skin is warm and dry.  Psychiatric: She has a normal mood and affect.  Nursing note and vitals reviewed.   ED Course  Procedures (including critical care time) Labs Review Labs Reviewed  URINALYSIS, ROUTINE W REFLEX MICROSCOPIC (NOT AT [facility]) - Abnormal; Notable for the following:    APPearance HAZY (*)    Glucose, UA 100 (*)    Leukocytes, UA MODERATE (*)    All other components within normal limits  URINE MICROSCOPIC-ADD ON - Abnormal; Notable for the following:    Squamous Epithelial / LPF TOO NUMEROUS TO COUNT (*)    Bacteria, UA RARE (*)    All other components within normal limits    Imaging Review No results found. I have personally reviewed and evaluated these images and lab results as part of my medical decision-making.   EKG Interpretation None      MDM   Final diagnoses:  UTI (lower urinary tract infection)    Likely uncomplicated UTI. No e/o of other causes for symptoms. Will tx appropriately, with PCP follow up.     Merrily Pew, MD 09/08/15 [phone]

## 2015-11-11 ENCOUNTER — Ambulatory Visit (INDEPENDENT_AMBULATORY_CARE_PROVIDER_SITE_OTHER): Payer: Medicare Other | Admitting: Cardiovascular Disease

## 2015-11-11 ENCOUNTER — Telehealth: Payer: Self-pay | Admitting: Cardiovascular Disease

## 2015-11-11 ENCOUNTER — Encounter: Payer: Self-pay | Admitting: Cardiovascular Disease

## 2015-11-11 VITALS — BP 180/100 | HR 78 | Ht 63.5 in | Wt 195.0 lb

## 2015-11-11 DIAGNOSIS — I10 Essential (primary) hypertension: Secondary | ICD-10-CM | POA: Diagnosis not present

## 2015-11-11 DIAGNOSIS — I2583 Coronary atherosclerosis due to lipid rich plaque: Secondary | ICD-10-CM

## 2015-11-11 DIAGNOSIS — I119 Hypertensive heart disease without heart failure: Secondary | ICD-10-CM | POA: Diagnosis not present

## 2015-11-11 DIAGNOSIS — E669 Obesity, unspecified: Secondary | ICD-10-CM

## 2015-11-11 DIAGNOSIS — I2510 Atherosclerotic heart disease of native coronary artery without angina pectoris: Secondary | ICD-10-CM

## 2015-11-11 MED ORDER — DILTIAZEM HCL ER COATED BEADS 120 MG PO CP24: 120.0000 mg | ORAL_CAPSULE | Freq: Every evening | ORAL | Status: DC | PRN

## 2015-11-11 MED ORDER — DILTIAZEM HCL ER COATED BEADS 120 MG PO CP24: 120.0000 mg | ORAL_CAPSULE | Freq: Every day | ORAL | Status: DC

## 2015-11-11 MED ORDER — METOPROLOL TARTRATE 100 MG PO TABS: 100.0000 mg | ORAL_TABLET | Freq: Two times a day (BID) | ORAL | Status: DC

## 2015-11-11 NOTE — Telephone Encounter (Signed)
She said she saw Dr Claiborne Billings this morning. Pt said he put her on Cardizem,she says she can not take this medicine.

## 2015-11-11 NOTE — Patient Instructions (Signed)
Your physician has recommended you make the following change in your medication:   1.) start new prescription for diltiazem 120 mg as directed on the bottle. This has been sent in to your pharmacy.  Your physician recommends that you schedule a follow-up appointment in: 2 months with Dr Joylene Draft and 6 months with Dr Claiborne Billings.

## 2015-11-13 ENCOUNTER — Encounter: Payer: Self-pay | Admitting: Cardiovascular Disease

## 2015-11-13 NOTE — Progress Notes (Signed)
Patient ID: Ruth [name], female   DOB: 1946/04/03, 70 y.o.   MRN: [id number]   Primary M.D.: Dr. Crist Infante  HPI: Ruth [name] is a 70 year old female who presents for six-month follow-up cardiology evaluation.  Ruth [name] has a history of coronary artery disease.  While living in [place] Ruth [name] developed chest discomfort and in 08-Nov-2004 underwent CABG surgery 4 at the [facility] in [place] by Dr. Marzetta Merino.  Ruth [name] had a LIMA to LAD, SVG to diagonal, SVG to obtuse marginal, and SVG to the RCA.   Her husband died in 11-08-2010 and Ruth [name] moved to the [place] area.  In 11-09-2011, Ruth [name] experienced symptoms of increasing shortness of breath.  Ruth [name] underwent cardiac catheterization and a stent was placed in the vein graft to her RCA.  Ruth [name] states that Ruth [name] has not had any subsequent stress testing.  Ruth [name] is a former patient of Dr. Einar Gip and an echocardiogram in July 2014 showed normal LV size and function with mild concentric left ventricular hypertrophy.  Ruth [name] had mild to moderate mitral regurgitation, mild tricuspid regurgitation, and mild primary hypertension with an estimated PA pressure 39 mm.  Ruth [name] has a history of hypertension for at least 15-20 years.  Ruth [name] states her blood pressure at home typically is in the 145-150 range, but her blood pressure is always elevated when Ruth [name] goes to the doctor's office.  Ruth [name] has a history of hyperlipidemia and apparently has been intolerant to statins but has never tried Zetia.  There is a history of diabetes mellitus but is not on therapy and states this is diet controlled.    Ruth [name] admits to  increased stress.  Her mother died in 2014/12/07 and recently her dog died.  Ruth [name] does not routinely exercise.  When I initially saw her in June 2016 Ruth [name] has been taking amlodipine 5 mg, losartan 50 mg twice a day and metoprolol 50 mrem twice a day for hypertension and CAD.  Ruth [name] has a history of multiple allergies.  Ruth [name] states that Ruth [name] is Intolerant to amlodipine as  well as Spironolactone.  Ruth [name] has been having labile blood pressure.  In the past. Ruth [name] became dehydrated on diarrhetic therapy.    Ruth [name] underwent an echo Doppler study on 03/13/2015 which showed an ejection fraction at 55-60%.  There was mild mitral regurgitation, mild left atrial dilatation, and very mild pulmonary hypertension with an estimated PA pressure 32 mm.  A nuclear perfusion study done on 03/21/2015 was low risk and demonstrated a very small region of mild ischemia in the basal anterolateral wall.  Ruth [name] had normal LV function with an EF of 65%.   he has a history of diverticular disease. Ruth [name] has noticed some vague left-sided chest discomfort which is not exertional. Ruth [name] continues to have blood pressure elevation.  Ruth [name] is unaware of palpitations. Ruth [name] admits to sleepiness but does not want any evaluation for sleep.  Past Medical History  Diagnosis Date  . CAD (coronary artery disease)     a. CABG x 4 in November 08, 2004 in [place]. b. DES to midbody of SVG-PDA/PLA 07/2012.  . Labile hypertension   . Dyslipidemia     Intolerant of statins  . Diverticulosis   . IBS (irritable bowel syndrome)   . Obesity   . Hyperplastic colon polyp   . Fecal incontinence   . Multiple allergies   . Anxiety   . Obesity   . Complication of anesthesia     "quit breathing when I got ?  Inovar" (07/28/2012)  . Steatohepatitis   . Migraines     "had them in my 30's" (07/28/2012)  . GERD (gastroesophageal reflux disease)   . Type II diabetes mellitus (HCC)     no meds, diet controlled  . Hyperlipidemia     Past Surgical History  Procedure Laterality Date  . Inguinal hernia repair  1970's?    left  . Excisional hemorrhoidectomy  1970's  . Breast lumpectomy      bilateral  . Cardiac catheterization  2006  . Coronary angioplasty with stent placement  07/28/2012    "1; first time for me" (07/28/2012)  . Coronary artery bypass graft  2006    CABG X4  . Abdominal hysterectomy  1970's  . Tonsillectomy and  adenoidectomy  ~ 1951  . Left heart catheterization with coronary/graft angiogram N/A 07/28/2012    Procedure: LEFT HEART CATHETERIZATION WITH Beatrix Fetters;  Surgeon: Burnell Blanks, MD;  Location: [facility] CATH LAB;  Service: Cardiovascular;  Laterality: N/A;  . Percutaneous coronary stent intervention (pci-s)  07/28/2012    Procedure: PERCUTANEOUS CORONARY STENT INTERVENTION (PCI-S);  Surgeon: Burnell Blanks, MD;  Location: [facility] CATH LAB;  Service: Cardiovascular;;    Allergies  Allergen Reactions  . Betadine [Povidone Iodine] Swelling  . Codeine Anaphylaxis and Other (See Comments)    "quit breathing" (07/28/2012)  . Demerol Other (See Comments)    "quit breathing" (07/28/2012)  . Latex Other (See Comments)    "quit breathing" (07/28/2012)  . Other Other (See Comments)    Perfume, Any Fragrance. "quit breathing" (07/28/2012)  . Percocet [Oxycodone-Acetaminophen] Other (See Comments)    "quit breathing; disoriented" (07/28/2012)  . Plavix [Clopidogrel Bisulfate] Other (See Comments)    "get hot; like I'm burning up inside; had to put me in shower after OHS because of that" (07/28/2012)  . Red Dye Other (See Comments)    "quit breathing" (07/28/2012)  . Shrimp Flavor Shortness Of Breath and Other (See Comments)    "broke out in knots all over" (07/28/2012)  . Shrimp [Shellfish Allergy] Shortness Of Breath and Other (See Comments)    "broke out in knots all over" (07/28/2012)  . Sulfonamide Derivatives Rash and Other (See Comments)    "quit breathing" (07/28/2012)  . Tylenol [Acetaminophen] Shortness Of Breath  . Iohexol     Unknown    Current Outpatient Prescriptions  Medication Sig Dispense Refill  . aspirin 325 MG EC tablet Take 325 mg by mouth daily.    . calcium carbonate (TUMS - DOSED IN MG ELEMENTAL CALCIUM) 500 MG chewable tablet Chew 1 tablet by mouth 2 (two) times daily as needed. For heartburn    . cholecalciferol (VITAMIN D) 1000 UNITS tablet  Take 1,000 Units by mouth daily.    . Cranberry-Vitamin C-Vitamin E (CRANBERRY PLUS VITAMIN C) 4200-20-3 MG-MG-UNIT CAPS Take 1 capsule by mouth daily.    Marland Kitchen losartan (COZAAR) 50 MG tablet Take 50 mg by mouth 2 (two) times daily.  0  . Magnesium 250 MG TABS Take 0.5 tablets by mouth 2 (two) times daily.    . metoprolol (LOPRESSOR) 100 MG tablet Take 1 tablet (100 mg total) by mouth 2 (two) times daily. 60 tablet 6  . nitroGLYCERIN (NITROSTAT) 0.4 MG SL tablet Place 1 tablet (0.4 mg total) under the tongue every 5 (five) minutes as needed for chest pain (up to 3 doses). 25 tablet 4  . vitamin B-12 (CYANOCOBALAMIN) 1000 MCG tablet Take 1,000 mcg by mouth daily.    Marland Kitchen  diltiazem (CARDIZEM CD) 120 MG 24 hr capsule Take 1 capsule (120 mg total) by mouth at bedtime. 30 capsule 6   No current facility-administered medications for this visit.    Social History   Social History  . Marital Status: Widowed    Spouse Name: N/A  . Number of Children: N/A  . Years of Education: N/A   Occupational History  . Not on file.   Social History Main Topics  . Smoking status: Never Smoker   . Smokeless tobacco: Never Used  . Alcohol Use: No  . Drug Use: No  . Sexual Activity: No   Other Topics Concern  . Not on file   Social History Narrative   Social history is notable that Ruth [name] is widowed.  Ruth [name] has 2 children, ages 88 and 72.  There is no tobacco use.  Ruth [name] does not routinely exercise.  Family History  Problem Relation Age of Onset  . Coronary artery disease Father   . Colon cancer Father   . Colon polyps Father   . Heart disease Father   . Hypertension    . Breast cancer      grandmother  . Diabetes Maternal Grandmother   . Diabetes Paternal Grandmother   . Stroke Mother   . Esophageal cancer Neg Hx   . Rectal cancer Neg Hx   . Stomach cancer Neg Hx    Family history is notable in that her father has heart disease and is 40 years old.  Her mother died at age 43 with a stroke.  Ruth [name] has 2  sisters, ages 71 and 58.  ROS General: Negative; No fevers, chills, or night sweats HEENT: Negative; No changes in vision or hearing, sinus congestion, difficulty swallowing Pulmonary: Negative; No cough, wheezing, shortness of breath, hemoptysis Cardiovascular: See HPI:  GI: Positive for left upper quadrant pain which he describes as "gas." GU: Negative; No dysuria, hematuria, or difficulty voiding Musculoskeletal: Negative; no myalgias, joint pain, or weakness Hematologic: Negative; no easy bruising, bleeding Endocrine: Positive for diabetes mellitus Neuro: Negative; no changes in balance, headaches Skin: Negative; No rashes or skin lesions Psychiatric: Negative; No behavioral problems, depression Sleep: Negative; No snoring,  daytime sleepiness, hypersomnolence, bruxism, restless legs, hypnogognic hallucinations. Other comprehensive 14 point system review is negative   Physical Exam BP 180/100 mmHg  Pulse 78  Ht 5' 3.5" (1.613 m)  Wt 195 lb (88.451 kg)  BMI 34.00 kg/m2  Repeat blood pressure by me was 172/92  Wt Readings from Last 3 Encounters:  11/11/15 195 lb (88.451 kg)  05/23/15 193 lb 3.2 oz (87.635 kg)  03/21/15 192 lb (87.091 kg)   General: Alert, oriented, no distress.  Skin: normal turgor, no rashes, warm and dry HEENT: Normocephalic, atraumatic. Pupils equal round and reactive to light; sclera anicteric; extraocular muscles intact, No lid lag; Nose without nasal septal hypertrophy; Mouth/Parynx benign; Mallinpatti scale 3 Neck: No JVD, no carotid bruits; normal carotid upstroke Lungs: clear to ausculatation and percussion bilaterally; no wheezing or rales, normal inspiratory and expiratory effort Chest wall: without tenderness to palpitation Heart: PMI not displaced, RRR, s1 s2 normal, 5-6/3 systolic murmur, No diastolic murmur, no rubs, gallops, thrills, or heaves Abdomen: soft, nontender; no hepatosplenomehaly, BS+; abdominal aorta nontender and not dilated by  palpation. Back: no CVA tenderness Pulses: 2+  Musculoskeletal: full range of motion, normal strength, no joint deformities Extremities: Pulses 2+, no clubbing cyanosis or edema, Homan's sign negative  Neurologic: grossly nonfocal; Cranial nerves grossly wnl Psychologic: Normal  mood and affect  ECG (independently read by me):  Normal sinus rhythm at 70 bpm. Nonspecific ST changes laterally.  03/04/2015 ECG (independently read by me): Normal sinus rhythm at 65 bpm.  Mild T wave abnormality in lead 1 and aVL.  LABS:  BMP Latest Ref Rng 03/19/2015 02/15/2014 02/14/2014  Glucose 70 - 99 mg/dL 197(H) 159(H) 178(H)  BUN 6 - 23 mg/dL 13 11 15   Creatinine 0.50 - 1.10 mg/dL 0.59 0.57 0.59  Sodium 135 - 145 mEq/L 139 140 142  Potassium 3.5 - 5.3 mEq/L 4.6 4.6 4.6  Chloride 96 - 112 mEq/L 100 102 103  CO2 19 - 32 mEq/L 23 24 25   Calcium 8.4 - 10.5 mg/dL 9.5 8.7 8.9     Hepatic Function Latest Ref Rng 03/19/2015 02/12/2014 07/07/2012  Total Protein 6.0 - 8.3 g/dL 6.9 8.0 7.6  Albumin 3.5 - 5.2 g/dL 4.1 4.1 4.0  AST 0 - 37 U/L 20 15 23   ALT 0 - 35 U/L 20 20 22   Alk Phosphatase 39 - 117 U/L 64 61 68  Total Bilirubin 0.2 - 1.2 mg/dL 0.7 1.5(H) 0.5  Bilirubin, Direct 0.0 - 0.3 mg/dL - - -    CBC Latest Ref Rng 03/19/2015 02/15/2014 02/14/2014  WBC 4.0 - 10.5 K/uL 6.0 8.2 8.9  Hemoglobin 12.0 - 15.0 g/dL 14.1 12.6 13.0  Hematocrit 36.0 - 46.0 % 42.1 38.4 39.6  Platelets 150 - 400 K/uL 267 210 181   Lab Results  Component Value Date   MCV 91.9 03/19/2015   MCV 93.7 02/15/2014   MCV 94.7 02/14/2014    Lab Results  Component Value Date   TSH 1.987 03/19/2015    BNP No results found for: BNP  ProBNP No results found for: PROBNP   Lipid Panel     Component Value Date/Time   CHOL 244* 03/19/2015 0812   TRIG 262* 03/19/2015 0812   HDL 29* 03/19/2015 0812   CHOLHDL 8.4 03/19/2015 0812   VLDL 52* 03/19/2015 0812   LDLCALC 163* 03/19/2015 0812   LDLDIRECT 160.9 06/21/2012 0833      RADIOLOGY: No results found.    ASSESSMENT AND PLAN: Ruth [name] is a 70 year old female who is 11 years status post CABG revascularization surgery 4, which was done at the [facility] in [place], [place].  Ruth [name] is status post DES stenting to the vein graft supplying her RCA territory.   Remotely Ruth [name] has experienced vague left-sided chest discomfort which was not exertionally precipitated.  An echo Doppler study showed normal systolic function without wall motion abnormalities.  Nuclear perfusion imaging showed only a minimal region (extent 5%, TPD 4%) of mild ischemia in the basal inferolateral wall.  Her blood pressure today is elevated and compatible with stage II hypertension. Ruth [name] initially was not willing to take additional blood pressure medication.  Ruth [name] has been maintained on losartan 50 Milligan grams twice a day and Lopressor 100 mg twice a day.  Her resting pulse is 78.  I've suggested the addition of Cardizem CD at 120 mg to take at bedtime. Ruth [name] has significant hyperlipidemia but has refused to take statin therapy. I also discussed with her the possibility of sleep apnea  contributing somewhat to her elevated blood pressure but Ruth [name] does not  desire any evaluation.  We discussed sodium restriction.  We discussed exercise and weight loss.  Ruth [name] is mildly obese with a BMI of 34.  I will see her in 2-3 months for reevaluation.  Time spent:  25 minutes  Troy Sine, MD, [facility]  11/13/2015 1:57 PM

## 2015-11-14 NOTE — Telephone Encounter (Signed)
Left message that call has been returned. Call back at her convenience.

## 2015-12-04 ENCOUNTER — Ambulatory Visit (INDEPENDENT_AMBULATORY_CARE_PROVIDER_SITE_OTHER): Payer: Medicare Other | Admitting: Podiatry

## 2015-12-04 ENCOUNTER — Encounter: Payer: Self-pay | Admitting: Podiatry

## 2015-12-04 VITALS — BP 183/104 | HR 75 | Resp 14

## 2015-12-04 DIAGNOSIS — L600 Ingrowing nail: Secondary | ICD-10-CM

## 2015-12-04 NOTE — Progress Notes (Signed)
   Subjective:    Patient ID: Ruth [name], female    DOB: [birth year], 70 y.o.   MRN: [id number]  HPI The patient presents here today with left great toe pain since 1 week.She says she has pain along the outside border big toe left foot. She says she has soaked her toe in Epsom salts for the last week. She says she has multiple medical problems and only wishes the nail corner to be removed. She presents the office for an evaluation and treatment. Patient is diabetic.   Review of Systems  All other systems reviewed and are negative.      Objective:   Physical Exam GENERAL APPEARANCE: Alert, conversant. Appropriately groomed. No acute distress.  VASCULAR: Pedal pulses are  palpable at  [facility] and [facility] bilateral.  Capillary refill time is immediate to all digits,  Normal temperature gradient.  Digital hair growth is present bilateral  NEUROLOGIC: sensation is normal to 5.07 monofilament at 5/5 sites bilateral.  Light touch is intact bilateral, Muscle strength normal.  MUSCULOSKELETAL: acceptable muscle strength, tone and stability bilateral.  Intrinsic muscluature intact bilateral.  Rectus appearance of foot and digits noted bilateral.   DERMATOLOGIC: skin color, texture, and turgor are within normal limits.  No preulcerative lesions or ulcers  are seen, no interdigital maceration noted.  No open lesions present.  . No drainage noted.  NAILS  Marked incurvation and pain along the lateral border left great toenail.         Assessment & Plan:  Ingrown toenail lateral border left great toe.   IE  Incision and drainage lateral border left great toe.  Home instructions given.   Gardiner Barefoot DPM

## 2016-03-12 DIAGNOSIS — M542 Cervicalgia: Secondary | ICD-10-CM | POA: Insufficient documentation

## 2016-03-19 ENCOUNTER — Encounter: Payer: Self-pay | Admitting: Cardiovascular Disease

## 2016-04-01 ENCOUNTER — Encounter: Payer: Self-pay | Admitting: Physical Therapy

## 2016-04-01 ENCOUNTER — Ambulatory Visit: Payer: Medicare Other | Attending: Internal Medicine | Admitting: Physical Therapy

## 2016-04-01 DIAGNOSIS — M62838 Other muscle spasm: Secondary | ICD-10-CM | POA: Diagnosis present

## 2016-04-01 DIAGNOSIS — M542 Cervicalgia: Secondary | ICD-10-CM | POA: Insufficient documentation

## 2016-04-01 DIAGNOSIS — R293 Abnormal posture: Secondary | ICD-10-CM | POA: Insufficient documentation

## 2016-04-01 NOTE — Therapy (Signed)
[place], [place], [address] Phone: [phone]   Fax:  [phone]  Physical Therapy Evaluation  Patient Details  Name: Ruth [name] MRN: [id number] Date of Birth: Jun 12, 1946 Referring Provider: Crist Infante MD  Encounter Date: 04/01/2016      PT End of Session - 04/01/16 1102    Visit Number 1   Number of Visits 13   Date for PT Re-Evaluation 05/13/16   PT Start Time 1017   PT Stop Time 1101   PT Time Calculation (min) 44 min   Activity Tolerance Patient tolerated treatment well   Behavior During Therapy [facility] for tasks assessed/performed      Past Medical History:  Diagnosis Date  . Anxiety   . CAD (coronary artery disease)    a. CABG x 4 in 2006 in [place]. b. DES to midbody of SVG-PDA/PLA 07/2012.  Marland Kitchen Complication of anesthesia    "quit breathing when I got ?Inovar" (07/28/2012)  . Depression   . Diverticulosis   . Dyslipidemia    Intolerant of statins  . Fecal incontinence   . GERD (gastroesophageal reflux disease)   . Hyperlipidemia   . Hyperplastic colon polyp   . IBS (irritable bowel syndrome)   . Labile hypertension   . Migraines    "had them in my 30's" (07/28/2012)  . Multiple allergies   . Obesity   . Obesity   . Steatohepatitis   . Type II diabetes mellitus (HCC)    no meds, diet controlled    Past Surgical History:  Procedure Laterality Date  . ABDOMINAL HYSTERECTOMY  1970's  . BREAST LUMPECTOMY     bilateral  . CARDIAC CATHETERIZATION  2006  . CORONARY ANGIOPLASTY WITH STENT PLACEMENT  07/28/2012   "1; first time for me" (07/28/2012)  . CORONARY ARTERY BYPASS GRAFT  2006   CABG X4  . EXCISIONAL HEMORRHOIDECTOMY  1970's  . INGUINAL HERNIA REPAIR  1970's?   left  . LEFT HEART CATHETERIZATION WITH CORONARY/GRAFT ANGIOGRAM N/A 07/28/2012   Procedure: LEFT HEART CATHETERIZATION WITH Beatrix Fetters;  Surgeon: Burnell Blanks, MD;  Location: [facility]  CATH LAB;  Service: Cardiovascular;  Laterality: N/A;  . PERCUTANEOUS CORONARY STENT INTERVENTION (PCI-S)  07/28/2012   Procedure: PERCUTANEOUS CORONARY STENT INTERVENTION (PCI-S);  Surgeon: Burnell Blanks, MD;  Location: [facility] CATH LAB;  Service: Cardiovascular;;  . TONSILLECTOMY AND ADENOIDECTOMY  ~ 1951    There were no vitals filed for this visit.       Subjective Assessment - 04/01/16 1023    Subjective pt is a 70 y.o F with CC of neck pain that has been going for 6 months that started insidiously. pain stays mostly in the neck on the R/L equally, deniess N/T down the arms. since onset the pain has gotten worse, reports intermittent headaches with over use.    Limitations Lifting;House hold activities   How long can you sit comfortably? 5 min    How long can you stand comfortably? unlimited   How long can you walk comfortably? unlimited   Diagnostic tests X-ray recently   Patient Stated Goals to make the pain go away, to be able to do yard work without pain, and to sleep better.    Currently in Pain? Yes   Pain Score 5    Pain Location Neck   Pain Orientation Right;Left;Mid   Pain Descriptors / Indicators Tightness   Pain Onset More than a month ago   Pain Frequency  Constant   Aggravating Factors  sitting, lifting, pusing (mowing)   Pain Relieving Factors shoulder shrugs, hot bath, epsom salt            [facility] PT Assessment - 04/01/16 1028      Assessment   Medical Diagnosis neck pain    Referring Provider Crist Infante MD   Onset Date/Surgical Date --  6 months   Hand Dominance Right   Next MD Visit --  3 months   Prior Therapy no     Precautions   Precautions None     Restrictions   Weight Bearing Restrictions No     Balance Screen   Has the patient fallen in the past 6 months No   Has the patient had a decrease in activity level because of a fear of falling?  No   Is the patient reluctant to leave their home because of a fear of falling?  No     Home  Environment   Living Environment Private residence   Living Arrangements Alone   Type of [place] to enter   Entrance Stairs-Number of Steps 3   [place] One level     Prior Function   Level of Independence Independent;Independent with basic ADLs   Vocation Retired   Leisure yard work, church     Cognition   Overall Cognitive Status Within [employer] for tasks assessed     Observation/Other Assessments   Focus on Therapeutic Outcomes (FOTO)  52% limited  predicted 37% limited     Posture/Postural Control   Posture/Postural Control Postural limitations   Postural Limitations Forward head;Rounded Shoulders     ROM / Strength   AROM / PROM / Strength AROM;Strength     AROM   AROM Assessment Site Cervical   Cervical Flexion 30  PDM   Cervical Extension 30  PDM   Cervical - Right Side Bend 30  ERP   Cervical - Left Side Bend 32  ERP   Cervical - Right Rotation 45  ERP   Cervical - Left Rotation 45  ERP     Palpation   Palpation comment tightness in bil upper traps with multiple trigger points. tightness in cervical multifidus, sub-occipitals and levator scapulae                   [facility] Adult PT Treatment/Exercise - 04/01/16 1028      Manual Therapy   Manual Therapy Myofascial release;Soft tissue mobilization   Soft tissue mobilization DTM rolling with tennis ball over bil upper traps and levator scapulae   Myofascial Release manual trigger point release x 2 ea in bil upper trap, x 1 in levator scap bil.   reported relief of pain                PT Education - 04/01/16 1101    Education provided Yes   Education Details evaluation findings, POC, Goals, HEP with proper form and treatment reationale. education of trap/ levator scapulae anatomy and effects on pain and referred symptoms. posture keeping shoulders down to prefent upper trap overactivation   Person(s) Educated Patient   Methods  Explanation;Demonstration;Handout;Verbal cues   Comprehension Verbalized understanding;Verbal cues required;Returned demonstration          PT Short Term Goals - 04/01/16 1107      PT SHORT TERM GOAL #1   Title pt will be I with inital HEP (04/22/2016)   Time 3   Period Weeks  Status New     PT SHORT TERM GOAL #2   Title pt will verbalize / demo proper posture and lifting/ carrying mechanics to prevent / reduce muscle tightness and neck pain (04/22/2016)   Time 3   Period Weeks   Status New           PT Long Term Goals - 04/01/16 1108      PT LONG TERM GOAL #1   Title pt will be I with all advanced HEP given (05/13/2016)   Time 6   Period Weeks   Status New     PT LONG TERM GOAL #2   Title pt will demonstrate reduce shoulder muscle tightness to reduce pain to </= 1/10 pain and improve cervical mobility (05/13/2016)   Time 6   Period Weeks   Status New     PT LONG TERM GOAL #3   Title She will improve cervical flexion/ extension by >/= 10 degrees  and cervical side bending/ rotation by >/= 8 degrees bil to assist with safety during driving and ADLs (12/09/345)   Time 6   Period Weeks   Status New     PT LONG TERM GOAL #4   Title Increaes FOTO score to </=37% limited to demonstrate improvement in function (05/13/2016)   Time 6   Period Weeks   Status New     PT LONG TERM GOAL #5   Title pt will be able to push/ pull >/= 15#, and lift / carry >/= 8# at or above her head to assist with functional lifting and carrying with </=2/10 pain and to assist with personal goal of doing yard work (05/13/2016)   Time 6   Period Weeks   Status New               Plan - 04/01/16 1102    Clinical Impression Statement Mrs. Theodis Aguas presents to O[facility] as a moderate complexity evaluation with CC of neck pain. she demosntrates mild limitation with cervical mobility secondary to pain which was noted during motion. palpation revealed multiple trigger points in bil upper traps/ levator  scapule and sub-occipitals. She would benefit from physical therapy to decrease neck pain, reduce muscle tightness, correct posture and lifting/ carrying mechanics and overall maximize her funciton by addressing the defecits listed.  following soft tiusse work she reported pain dropped to 4/10.   Moderate complexity based on anxiety, HTN, depression, DM : eval findings of abnormal posture, limited cervical ROM, pain, muscle tightness, worsening symptoms int he shoulder that fluctuates    Rehab Potential Good   PT Frequency 2x / week   PT Duration 6 weeks   PT Treatment/Interventions ADLs/Self Care Home Management;Cryotherapy;Electrical Stimulation;Iontophoresis 80m/ml Dexamethasone;Passive range of motion;Taping;Therapeutic exercise;Therapeutic activities;Ultrasound;Moist Heat;Manual techniques;Patient/family education;Dry needling   PT Next Visit Plan assess/ review HEP, soft tissue work to bil upper traps and surrounding muscles, posture education, Modalites for pain/ tightness PRN(pt reports issues with fragrant smells, or anything that has chemicals in it)   PT Home Exercise Plan upper trap stretch, levator scapulae stretch, chin tucks, rows.       Patient will benefit from skilled therapeutic intervention in order to improve the following deficits and impairments:  Pain, Improper body mechanics, Postural dysfunction, Hypomobility, Decreased range of motion, Decreased activity tolerance, Decreased endurance, Increased fascial restricitons  Visit Diagnosis: Cervicalgia - Plan: PT plan of care cert/re-cert  Other muscle spasm - Plan: PT plan of care cert/re-cert  Abnormal posture - Plan: PT plan of care cert/re-cert  G-Codes - 04/01/16 1117    Functional Assessment Tool Used FOTO/ clinical judgement   Functional Limitation Carrying, moving and handling objects   Carrying, Moving and Handling Objects Current Status [phone]) At least 40 percent but less than 60 percent impaired, limited  or restricted   Carrying, Moving and Handling Objects Goal Status ([id number]) At least 20 percent but less than 40 percent impaired, limited or restricted       Problem List Patient Active Problem List   Diagnosis Date Noted  . Hypertensive heart disease 05/25/2015  . Type II or unspecified type diabetes mellitus without mention of complication, uncontrolled 02/14/2014  . Hyponatremia 02/13/2014  . HTN (hypertension) 02/13/2014  . Sepsis secondary to UTI ([place]) 02/12/2014  . Acute pyelonephritis 02/12/2014  . Unstable angina ([place]) 07/29/2012  . Obesity (BMI 30-39.9) 07/29/2012  . Bloating 06/15/2012  . Generalized abdominal pain 06/15/2012  . Family history of colon cancer 06/15/2012  . 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 Coronary artery disease 12/30/2010  . Hyperlipidemia 12/30/2010  . Diabetes mellitus ([place]) 12/30/2010  . RECTAL INCONTINENCE 06/13/2008   Starr Lake PT, DPT, LAT, ATC  04/01/16  11:21 AM      [facility] [address] [place], [place], [address] Phone: [phone]   Fax:  [phone]  Name: Ruth [name] MRN: [id number] Date of Birth: 07/04/1946

## 2016-04-14 ENCOUNTER — Ambulatory Visit: Payer: Medicare Other | Attending: Internal Medicine | Admitting: Physical Therapy

## 2016-04-14 DIAGNOSIS — M542 Cervicalgia: Secondary | ICD-10-CM | POA: Diagnosis not present

## 2016-04-14 DIAGNOSIS — M62838 Other muscle spasm: Secondary | ICD-10-CM | POA: Diagnosis present

## 2016-04-14 DIAGNOSIS — R293 Abnormal posture: Secondary | ICD-10-CM

## 2016-04-14 NOTE — Therapy (Signed)
[place], [place], [address] Phone: [phone]   Fax:  [phone]  Physical Therapy Treatment  Patient Details  Name: Ruth [name] MRN: [id number] Date of Birth: 08-Nov-1945 Referring Provider: Crist Infante MD  Encounter Date: 04/14/2016      PT End of Session - 04/14/16 1807    Visit Number 2   Number of Visits 13   Date for PT Re-Evaluation 05/13/16   PT Start Time 1021   PT Stop Time 1105   PT Time Calculation (min) 44 min   Activity Tolerance Patient tolerated treatment well   Behavior During Therapy [facility] for tasks assessed/performed      Past Medical History:  Diagnosis Date  . Anxiety   . CAD (coronary artery disease)    a. CABG x 4 in 2006 in [place]. b. DES to midbody of SVG-PDA/PLA 07/2012.  Marland Kitchen Complication of anesthesia    "quit breathing when I got ?Inovar" (07/28/2012)  . Depression   . Diverticulosis   . Dyslipidemia    Intolerant of statins  . Fecal incontinence   . GERD (gastroesophageal reflux disease)   . Hyperlipidemia   . Hyperplastic colon polyp   . IBS (irritable bowel syndrome)   . Labile hypertension   . Migraines    "had them in my 30's" (07/28/2012)  . Multiple allergies   . Obesity   . Obesity   . Steatohepatitis   . Type II diabetes mellitus (HCC)    no meds, diet controlled    Past Surgical History:  Procedure Laterality Date  . ABDOMINAL HYSTERECTOMY  1970's  . BREAST LUMPECTOMY     bilateral  . CARDIAC CATHETERIZATION  2006  . CORONARY ANGIOPLASTY WITH STENT PLACEMENT  07/28/2012   "1; first time for me" (07/28/2012)  . CORONARY ARTERY BYPASS GRAFT  2006   CABG X4  . EXCISIONAL HEMORRHOIDECTOMY  1970's  . INGUINAL HERNIA REPAIR  1970's?   left  . LEFT HEART CATHETERIZATION WITH CORONARY/GRAFT ANGIOGRAM N/A 07/28/2012   Procedure: LEFT HEART CATHETERIZATION WITH Beatrix Fetters;  Surgeon: Burnell Blanks, MD;  Location: [facility]  CATH LAB;  Service: Cardiovascular;  Laterality: N/A;  . PERCUTANEOUS CORONARY STENT INTERVENTION (PCI-S)  07/28/2012   Procedure: PERCUTANEOUS CORONARY STENT INTERVENTION (PCI-S);  Surgeon: Burnell Blanks, MD;  Location: [facility] CATH LAB;  Service: Cardiovascular;;  . TONSILLECTOMY AND ADENOIDECTOMY  ~ 1951    There were no vitals filed for this visit.      Subjective Assessment - 04/14/16 1027    Subjective (P)  4/10.  Sometimes worse with yardwork.    Currently in Pain? (P)  Yes   Pain Score (P)  4    Pain Descriptors / Indicators (P)  Aching;Sore                         [facility] Adult PT Treatment/Exercise - 04/14/16 0001      Self-Care   Self-Care --  posture education sitting     Neck Exercises: Seated   Postural Training scapular squeeze , 5 X 5 seconds.      Manual Therapy   Soft tissue mobilization instrument assist intermittantly through clothes.  unable to use lotion from clinic, patient sensitive.    Myofascial Release trigger point release, soft tissue work upper trap, neck and mid back                PT Education - 04/14/16  43    Education provided Yes   Education Details posture importance   Person(s) Educated Patient   Methods Explanation;Demonstration   Comprehension Verbalized understanding          PT Short Term Goals - 04/14/16 1810      PT SHORT TERM GOAL #1   Title pt will be I with inital HEP (04/22/2016)   Time 3   Period Weeks   Status On-going     PT SHORT TERM GOAL #2   Title pt will verbalize / demo proper posture and lifting/ carrying mechanics to prevent / reduce muscle tightness and neck pain (04/22/2016)   Baseline beginning education   Time 3   Period Weeks   Status On-going           PT Long Term Goals - 04/01/16 1108      PT LONG TERM GOAL #1   Title pt will be I with all advanced HEP given (05/13/2016)   Time 6   Period Weeks   Status New     PT LONG TERM GOAL #2   Title pt will  demonstrate reduce shoulder muscle tightness to reduce pain to </= 1/10 pain and improve cervical mobility (05/13/2016)   Time 6   Period Weeks   Status New     PT LONG TERM GOAL #3   Title She will improve cervical flexion/ extension by >/= 10 degrees  and cervical side bending/ rotation by >/= 8 degrees bil to assist with safety during driving and ADLs (10/16/1914)   Time 6   Period Weeks   Status New     PT LONG TERM GOAL #4   Title Increaes FOTO score to </=37% limited to demonstrate improvement in function (05/13/2016)   Time 6   Period Weeks   Status New     PT LONG TERM GOAL #5   Title pt will be able to push/ pull >/= 15#, and lift / carry >/= 8# at or above her head to assist with functional lifting and carrying with </=2/10 pain and to assist with personal goal of doing yard work (05/13/2016)   Time 6   Period Weeks   Status New               Plan - 04/14/16 1808    Clinical Impression Statement manual helpful. Patient wants to bring her own lotion for massage due to her sensitivity to fragrance.  No new goals met,    PT Next Visit Plan manual, reviev previously issued exercises, Use patient's lotion.   progress home exercises if ready.   PT Home Exercise Plan upper trap stretch, levator scapulae stretch, chin tucks, rows.    Consulted and Agree with Plan of Care Patient      Patient will benefit from skilled therapeutic intervention in order to improve the following deficits and impairments:     Visit Diagnosis: Cervicalgia  Other muscle spasm  Abnormal posture     Problem List Patient Active Problem List   Diagnosis Date Noted  . Hypertensive heart disease 05/25/2015  . Type II or unspecified type diabetes mellitus without mention of complication, uncontrolled 02/14/2014  . Hyponatremia 02/13/2014  . HTN (hypertension) 02/13/2014  . Sepsis secondary to UTI ([place]) 02/12/2014  . Acute pyelonephritis 02/12/2014  . Unstable angina ([place]) 07/29/2012  .  Obesity (BMI 30-39.9) 07/29/2012  . Bloating 06/15/2012  . Generalized abdominal pain 06/15/2012  . Family history of colon cancer 06/15/2012  . 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 Coronary artery disease 12/30/2010  . Hyperlipidemia 12/30/2010  . Diabetes mellitus ([place]) 12/30/2010  . RECTAL INCONTINENCE 06/13/2008    HARRIS,KAREN 04/14/2016, 6:11 PM  [facility] [address] [place], [place], [address] Phone: [phone]   Fax:  [phone]  Name: Ruth [name] MRN: [id number] Date of Birth: [birth year]   Melvenia Needles, PTA 04/14/16 6:11 PM Phone: [phone] Fax: [phone]

## 2016-04-16 ENCOUNTER — Encounter: Payer: Medicare Other | Admitting: Physical Therapy

## 2016-04-17 ENCOUNTER — Ambulatory Visit: Payer: Medicare Other | Admitting: Physical Therapy

## 2016-04-17 DIAGNOSIS — R293 Abnormal posture: Secondary | ICD-10-CM

## 2016-04-17 DIAGNOSIS — M542 Cervicalgia: Secondary | ICD-10-CM | POA: Diagnosis not present

## 2016-04-17 DIAGNOSIS — M62838 Other muscle spasm: Secondary | ICD-10-CM

## 2016-04-17 NOTE — Therapy (Signed)
[place], [place], [address] Phone: [phone]   Fax:  [phone]  Physical Therapy Treatment  Patient Details  Name: Ruth [name] MRN: [id number] Date of Birth: [birth year] Referring Provider: Crist Infante MD  Encounter Date: 04/17/2016      PT End of Session - 04/17/16 1158    Visit Number 3   Number of Visits 13   Date for PT Re-Evaluation 05/13/16   PT Start Time 0933   PT Stop Time 1020   PT Time Calculation (min) 47 min   Activity Tolerance Patient tolerated treatment well   Behavior During Therapy [facility] for tasks assessed/performed      Past Medical History:  Diagnosis Date  . Anxiety   . CAD (coronary artery disease)    a. CABG x 4 in 2006 in [place]. b. DES to midbody of SVG-PDA/PLA 07/2012.  Marland Kitchen Complication of anesthesia    "quit breathing when I got ?Inovar" (07/28/2012)  . Depression   . Diverticulosis   . Dyslipidemia    Intolerant of statins  . Fecal incontinence   . GERD (gastroesophageal reflux disease)   . Hyperlipidemia   . Hyperplastic colon polyp   . IBS (irritable bowel syndrome)   . Labile hypertension   . Migraines    "had them in my 30's" (07/28/2012)  . Multiple allergies   . Obesity   . Obesity   . Steatohepatitis   . Type II diabetes mellitus (HCC)    no meds, diet controlled    Past Surgical History:  Procedure Laterality Date  . ABDOMINAL HYSTERECTOMY  1970's  . BREAST LUMPECTOMY     bilateral  . CARDIAC CATHETERIZATION  2006  . CORONARY ANGIOPLASTY WITH STENT PLACEMENT  07/28/2012   "1; first time for me" (07/28/2012)  . CORONARY ARTERY BYPASS GRAFT  2006   CABG X4  . EXCISIONAL HEMORRHOIDECTOMY  1970's  . INGUINAL HERNIA REPAIR  1970's?   left  . LEFT HEART CATHETERIZATION WITH CORONARY/GRAFT ANGIOGRAM N/A 07/28/2012   Procedure: LEFT HEART CATHETERIZATION WITH Beatrix Fetters;  Surgeon: Burnell Blanks, MD;  Location: [facility]  CATH LAB;  Service: Cardiovascular;  Laterality: N/A;  . PERCUTANEOUS CORONARY STENT INTERVENTION (PCI-S)  07/28/2012   Procedure: PERCUTANEOUS CORONARY STENT INTERVENTION (PCI-S);  Surgeon: Burnell Blanks, MD;  Location: [facility] CATH LAB;  Service: Cardiovascular;;  . TONSILLECTOMY AND ADENOIDECTOMY  ~ 1951    There were no vitals filed for this visit.      Subjective Assessment - 04/17/16 0943    Subjective 5/10 now.  Sore from being prone last session.  Brought her own lotion.  And  soap to wash our hands  prior to session.    Currently in Pain? Yes   Pain Score 5    Pain Location Neck   Pain Orientation Right;Left;Mid   Pain Descriptors / Indicators Tightness   Pain Radiating Towards shoulder right with using arm   Pain Frequency Constant   Aggravating Factors  sitting, mowing    Pain Relieving Factors epsom salts, soft tissue work,                           [facility] Adult PT Treatment/Exercise - 04/17/16 0001      Self-Care   Self-Care --  reaching modifications demonstrated with lower reaching.       Neck Exercises: Seated   Cervical Rotation 5 reps;Both  cues for posture prior  to stretch   Lateral Flexion 5 reps  5 seconds,  painful end range, cued to use her hand for less     Shoulder Exercises: Seated   Retraction --  3 reps   Row 10 reps   Theraband Level (Shoulder Row) Level 2 (Red)   Row Limitations Multiple cues needed.  Shoulder pain right noted.      Manual Therapy   Manual therapy comments Thera cane shown and patient practiced using.  She is considering purchase.   Myofascial Release trigger point release, soft tissue work upper trap, neck and mid back  pain decreased to 2-1/10 post srssion.     Neck Exercises: Stretches   Levator Stretch 3 reps;10 seconds   Other Neck Stretches neck flexion 10 seconds  1 X                PT Education - 04/17/16 1157    Education provided Yes   Education Details ADL handout,  posture ed  continued   Person(s) Educated Patient   Methods Demonstration;Handout   Comprehension Verbalized understanding;Need further instruction          PT Short Term Goals - 04/14/16 1810      PT SHORT TERM GOAL #1   Title pt will be I with inital HEP (04/22/2016)   Time 3   Period Weeks   Status On-going     PT SHORT TERM GOAL #2   Title pt will verbalize / demo proper posture and lifting/ carrying mechanics to prevent / reduce muscle tightness and neck pain (04/22/2016)   Baseline beginning education   Time 3   Period Weeks   Status On-going           PT Long Term Goals - 04/01/16 1108      PT LONG TERM GOAL #1   Title pt will be I with all advanced HEP given (05/13/2016)   Time 6   Period Weeks   Status New     PT LONG TERM GOAL #2   Title pt will demonstrate reduce shoulder muscle tightness to reduce pain to </= 1/10 pain and improve cervical mobility (05/13/2016)   Time 6   Period Weeks   Status New     PT LONG TERM GOAL #3   Title She will improve cervical flexion/ extension by >/= 10 degrees  and cervical side bending/ rotation by >/= 8 degrees bil to assist with safety during driving and ADLs (5/0/9326)   Time 6   Period Weeks   Status New     PT LONG TERM GOAL #4   Title Increaes FOTO score to </=37% limited to demonstrate improvement in function (05/13/2016)   Time 6   Period Weeks   Status New     PT LONG TERM GOAL #5   Title pt will be able to push/ pull >/= 15#, and lift / carry >/= 8# at or above her head to assist with functional lifting and carrying with </=2/10 pain and to assist with personal goal of doing yard work (05/13/2016)   Time 6   Period [place] - 04/17/16 1159    Clinical Impression Statement Patient has been working in garage from 6 am to 11 pm with some rests to go through boxes from moving years ago.  Some soreness noted from this,  she has not been using the best body mechanics.  She continues to need  cues with her hoime exercoise program.  No new objective findings noted today.   PT Next Visit Plan manual, Use patient's lotion.   progress home exercises,  consider decompression or scapular stab with bands.    PT Home Exercise Plan Continue with improved technique   Consulted and Agree with Plan of Care Patient      Patient will benefit from skilled therapeutic intervention in order to improve the following deficits and impairments:  Pain, Improper body mechanics, Postural dysfunction, Hypomobility, Decreased range of motion, Decreased activity tolerance, Decreased endurance, Increased fascial restricitons  Visit Diagnosis: Cervicalgia  Other muscle spasm  Abnormal posture     Problem List Patient Active Problem List   Diagnosis Date Noted  . Hypertensive heart disease 05/25/2015  . Type II or unspecified type diabetes mellitus without mention of complication, uncontrolled 02/14/2014  . Hyponatremia 02/13/2014  . HTN (hypertension) 02/13/2014  . Sepsis secondary to UTI ([place]) 02/12/2014  . Acute pyelonephritis 02/12/2014  . Unstable angina ([place]) 07/29/2012  . Obesity (BMI 30-39.9) 07/29/2012  . Bloating 06/15/2012  . Generalized abdominal pain 06/15/2012  . Family history of colon cancer 06/15/2012  . 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 Coronary artery disease 12/30/2010  . Hyperlipidemia 12/30/2010  . Diabetes mellitus ([place]) 12/30/2010  . RECTAL INCONTINENCE 06/13/2008    Kainan Patty 04/17/2016, 12:05 PM  [facility] [address] [place], [place], [address] Phone: [phone]   Fax:  [phone]  Name: Ruth [name] MRN: [id number] Date of Birth: Jan 15, 1946   Melvenia Needles, PTA 04/17/16 12:05 PM Phone: [phone] Fax: [phone]

## 2016-04-17 NOTE — Patient Instructions (Signed)

## 2016-04-21 ENCOUNTER — Ambulatory Visit: Payer: Medicare Other | Admitting: Physical Therapy

## 2016-04-21 DIAGNOSIS — M542 Cervicalgia: Secondary | ICD-10-CM

## 2016-04-21 DIAGNOSIS — R293 Abnormal posture: Secondary | ICD-10-CM

## 2016-04-21 DIAGNOSIS — M62838 Other muscle spasm: Secondary | ICD-10-CM

## 2016-04-21 NOTE — Therapy (Signed)
[place], [place], [address] Phone: [phone]   Fax:  [phone]  Physical Therapy Treatment  Patient Details  Name: Ruth [name] MRN: [id number] Date of Birth: 03-03-1946 Referring Provider: Crist Infante MD  Encounter Date: 04/21/2016      PT End of Session - 04/21/16 1824    Visit Number 4   Number of Visits 13   Date for PT Re-Evaluation 05/13/16   PT Start Time 1022   PT Stop Time 1047   PT Time Calculation (min) 25 min   Activity Tolerance Patient tolerated treatment well   Behavior During Therapy [facility] for tasks assessed/performed      Past Medical History:  Diagnosis Date  . Anxiety   . CAD (coronary artery disease)    a. CABG x 4 in 2006 in [place]. b. DES to midbody of SVG-PDA/PLA 07/2012.  Marland Kitchen Complication of anesthesia    "quit breathing when I got ?Inovar" (07/28/2012)  . Depression   . Diverticulosis   . Dyslipidemia    Intolerant of statins  . Fecal incontinence   . GERD (gastroesophageal reflux disease)   . Hyperlipidemia   . Hyperplastic colon polyp   . IBS (irritable bowel syndrome)   . Labile hypertension   . Migraines    "had them in my 30's" (07/28/2012)  . Multiple allergies   . Obesity   . Obesity   . Steatohepatitis   . Type II diabetes mellitus (HCC)    no meds, diet controlled    Past Surgical History:  Procedure Laterality Date  . ABDOMINAL HYSTERECTOMY  1970's  . BREAST LUMPECTOMY     bilateral  . CARDIAC CATHETERIZATION  2006  . CORONARY ANGIOPLASTY WITH STENT PLACEMENT  07/28/2012   "1; first time for me" (07/28/2012)  . CORONARY ARTERY BYPASS GRAFT  2006   CABG X4  . EXCISIONAL HEMORRHOIDECTOMY  1970's  . INGUINAL HERNIA REPAIR  1970's?   left  . LEFT HEART CATHETERIZATION WITH CORONARY/GRAFT ANGIOGRAM N/A 07/28/2012   Procedure: LEFT HEART CATHETERIZATION WITH Beatrix Fetters;  Surgeon: Burnell Blanks, MD;  Location: [facility]  CATH LAB;  Service: Cardiovascular;  Laterality: N/A;  . PERCUTANEOUS CORONARY STENT INTERVENTION (PCI-S)  07/28/2012   Procedure: PERCUTANEOUS CORONARY STENT INTERVENTION (PCI-S);  Surgeon: Burnell Blanks, MD;  Location: [facility] CATH LAB;  Service: Cardiovascular;;  . TONSILLECTOMY AND ADENOIDECTOMY  ~ 1951    There were no vitals filed for this visit.      Subjective Assessment - 04/21/16 1056    Subjective My neck is feeling better,  I have been working on posture and it helps.  Less upper back pain.   Currently in Pain? Yes   Pain Score 5    Pain Location Neck   Pain Orientation Right;Left;Mid   Pain Descriptors / Indicators Tightness  pain   Pain Radiating Towards shoulders,  down to bra area   Pain Frequency Constant   Aggravating Factors  sitting, mowing,  turning head to drive   Pain Relieving Factors massage , posture changes                         [facility] Adult PT Treatment/Exercise - 04/21/16 0001      Neck Exercises: Seated   Neck Retraction 1 rep;5 secs   Cervical Rotation --  3 reps using hand for neck support   Other Seated Exercise neck flexion, lateral flexion and royations, AROM,  painful unless she supports head with hand for lowering.    able to touch chin to chest   Other Seated Exercise trunk rotation 2 x 10 seconds each way,  painful end range.     Manual Therapy   Myofascial Release trigger point release, soft tissue work upper trap, neck and mid back  pain decreased to 2-1/10 post srssion.                  PT Short Term Goals - 04/21/16 1827      PT SHORT TERM GOAL #1   Title pt will be I with inital HEP (04/22/2016)   Time 3   Period Weeks   Status On-going     PT SHORT TERM GOAL #2   Title pt will verbalize / demo proper posture and lifting/ carrying mechanics to prevent / reduce muscle tightness and neck pain (04/22/2016)   Baseline reports doing better with these at home   Time 3   Period Weeks   Status  On-going           PT Long Term Goals - 04/01/16 1108      PT LONG TERM GOAL #1   Title pt will be I with all advanced HEP given (05/13/2016)   Time 6   Period Weeks   Status New     PT LONG TERM GOAL #2   Title pt will demonstrate reduce shoulder muscle tightness to reduce pain to </= 1/10 pain and improve cervical mobility (05/13/2016)   Time 6   Period Weeks   Status New     PT LONG TERM GOAL #3   Title She will improve cervical flexion/ extension by >/= 10 degrees  and cervical side bending/ rotation by >/= 8 degrees bil to assist with safety during driving and ADLs (04/10/6961)   Time 6   Period Weeks   Status New     PT LONG TERM GOAL #4   Title Increaes FOTO score to </=37% limited to demonstrate improvement in function (05/13/2016)   Time 6   Period Weeks   Status New     PT LONG TERM GOAL #5   Title pt will be able to push/ pull >/= 15#, and lift / carry >/= 8# at or above her head to assist with functional lifting and carrying with </=2/10 pain and to assist with personal goal of doing yard work (05/13/2016)   Time 6   Period Weeks   Status New               Plan - 04/21/16 1825    Clinical Impression Statement Short session patient had to leave early.  Pain improving Full neck flexion With AA of hand to rest her head on with flexion.  Progress toward pain goals.    PT Next Visit Plan Issue AROM with the use of hand,  work toward goals.    PT Home Exercise Plan Continue with improved technique   Consulted and Agree with Plan of Care Patient      Patient will benefit from skilled therapeutic intervention in order to improve the following deficits and impairments:  Pain, Improper body mechanics, Postural dysfunction, Hypomobility, Decreased range of motion, Decreased activity tolerance, Decreased endurance, Increased fascial restricitons  Visit Diagnosis: Cervicalgia  Other muscle spasm  Abnormal posture     Problem List Patient Active Problem List    Diagnosis Date Noted  . Hypertensive heart disease 05/25/2015  . Type II or unspecified type  diabetes mellitus without mention of complication, uncontrolled 02/14/2014  . Hyponatremia 02/13/2014  . HTN (hypertension) 02/13/2014  . Sepsis secondary to UTI ([place]) 02/12/2014  . Acute pyelonephritis 02/12/2014  . Unstable angina ([place]) 07/29/2012  . Obesity (BMI 30-39.9) 07/29/2012  . Bloating 06/15/2012  . Generalized abdominal pain 06/15/2012  . Family history of colon cancer 06/15/2012  . 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 Coronary artery disease 12/30/2010  . Hyperlipidemia 12/30/2010  . Diabetes mellitus ([place]) 12/30/2010  . RECTAL INCONTINENCE 06/13/2008    HARRIS,KAREN 04/21/2016, 6:29 PM  [facility] [address] [place], [place], [address] Phone: [phone]   Fax:  [phone]  Name: Ruth [name] MRN: [id number] Date of Birth: November 17, 1945   Melvenia Needles, PTA 04/21/16 6:29 PM Phone: [phone] Fax: [phone]

## 2016-04-23 ENCOUNTER — Ambulatory Visit: Payer: Medicare Other | Admitting: Physical Therapy

## 2016-04-23 DIAGNOSIS — M62838 Other muscle spasm: Secondary | ICD-10-CM

## 2016-04-23 DIAGNOSIS — M542 Cervicalgia: Secondary | ICD-10-CM

## 2016-04-23 DIAGNOSIS — R293 Abnormal posture: Secondary | ICD-10-CM

## 2016-04-23 NOTE — Patient Instructions (Signed)
Neck ROM from Ex drawer.  Use of hand to assist stretch, pain free.  5-10 X each after a warm bath.  daily

## 2016-04-23 NOTE — Therapy (Signed)
[place], [place], [address] Phone: [phone]   Fax:  [phone]  Physical Therapy Treatment  Patient Details  Name: Ruth [name] MRN: [id number] Date of Birth: 1946-01-07 Referring Provider: Crist Infante MD  Encounter Date: 04/23/2016      PT End of Session - 04/23/16 1832    Visit Number 5   Number of Visits 13   Date for PT Re-Evaluation 05/13/16   PT Start Time 0935   PT Stop Time 1017   PT Time Calculation (min) 42 min   Activity Tolerance Patient tolerated treatment well   Behavior During Therapy [facility] for tasks assessed/performed      Past Medical History:  Diagnosis Date  . Anxiety   . CAD (coronary artery disease)    a. CABG x 4 in 2006 in [place]. b. DES to midbody of SVG-PDA/PLA 07/2012.  Marland Kitchen Complication of anesthesia    "quit breathing when I got ?Inovar" (07/28/2012)  . Depression   . Diverticulosis   . Dyslipidemia    Intolerant of statins  . Fecal incontinence   . GERD (gastroesophageal reflux disease)   . Hyperlipidemia   . Hyperplastic colon polyp   . IBS (irritable bowel syndrome)   . Labile hypertension   . Migraines    "had them in my 30's" (07/28/2012)  . Multiple allergies   . Obesity   . Obesity   . Steatohepatitis   . Type II diabetes mellitus (HCC)    no meds, diet controlled    Past Surgical History:  Procedure Laterality Date  . ABDOMINAL HYSTERECTOMY  1970's  . BREAST LUMPECTOMY     bilateral  . CARDIAC CATHETERIZATION  2006  . CORONARY ANGIOPLASTY WITH STENT PLACEMENT  07/28/2012   "1; first time for me" (07/28/2012)  . CORONARY ARTERY BYPASS GRAFT  2006   CABG X4  . EXCISIONAL HEMORRHOIDECTOMY  1970's  . INGUINAL HERNIA REPAIR  1970's?   left  . LEFT HEART CATHETERIZATION WITH CORONARY/GRAFT ANGIOGRAM N/A 07/28/2012   Procedure: LEFT HEART CATHETERIZATION WITH Beatrix Fetters;  Surgeon: Burnell Blanks, MD;  Location: [facility]  CATH LAB;  Service: Cardiovascular;  Laterality: N/A;  . PERCUTANEOUS CORONARY STENT INTERVENTION (PCI-S)  07/28/2012   Procedure: PERCUTANEOUS CORONARY STENT INTERVENTION (PCI-S);  Surgeon: Burnell Blanks, MD;  Location: [facility] CATH LAB;  Service: Cardiovascular;;  . TONSILLECTOMY AND ADENOIDECTOMY  ~ 1951    There were no vitals filed for this visit.      Subjective Assessment - 04/23/16 0959    Subjective My neck is moving better.  3/10 today.  Is not waking as much at night with pain,   Currently in Pain? Yes   Pain Score 3    Pain Location Neck                         [facility] Adult PT Treatment/Exercise - 04/23/16 0001      Neck Exercises: Seated   Other Seated Exercise Neck arom 3 way both sides 5-10 X each HEP  hand needed. LT lateral flexion     Shoulder Exercises: Standing   Row 5 reps  felt she had pulling on chest incision (old scar)    Retraction 5 reps  cues     Shoulder Exercises: ROM/Strengthening   UBE (Upper Arm Bike) Nustep L1 arms/legs X 5 minutes     Manual Therapy   Myofascial Release trigger point release, soft tissue  work upper trap, neck and mid back  pain decreased to 2-1/10 post srssion.                PT Education - 04/23/16 1832    Education provided Yes   Education Details Neck ROM   Person(s) Educated Patient   Methods Explanation;Demonstration;Verbal cues;Handout   Comprehension Verbalized understanding;Returned demonstration          PT Short Term Goals - 04/21/16 1827      PT SHORT TERM GOAL #1   Title pt will be I with inital HEP (04/22/2016)   Time 3   Period Weeks   Status On-going     PT SHORT TERM GOAL #2   Title pt will verbalize / demo proper posture and lifting/ carrying mechanics to prevent / reduce muscle tightness and neck pain (04/22/2016)   Baseline reports doing better with these at home   Time 3   Period Weeks   Status On-going           PT Long Term Goals - 04/01/16 1108       PT LONG TERM GOAL #1   Title pt will be I with all advanced HEP given (05/13/2016)   Time 6   Period Weeks   Status New     PT LONG TERM GOAL #2   Title pt will demonstrate reduce shoulder muscle tightness to reduce pain to </= 1/10 pain and improve cervical mobility (05/13/2016)   Time 6   Period Weeks   Status New     PT LONG TERM GOAL #3   Title She will improve cervical flexion/ extension by >/= 10 degrees  and cervical side bending/ rotation by >/= 8 degrees bil to assist with safety during driving and ADLs (12/12/960)   Time 6   Period Weeks   Status New     PT LONG TERM GOAL #4   Title Increaes FOTO score to </=37% limited to demonstrate improvement in function (05/13/2016)   Time 6   Period Weeks   Status New     PT LONG TERM GOAL #5   Title pt will be able to push/ pull >/= 15#, and lift / carry >/= 8# at or above her head to assist with functional lifting and carrying with </=2/10 pain and to assist with personal goal of doing yard work (05/13/2016)   Time 6   Period Weeks   Status New               Plan - 04/23/16 1833    Clinical Impression Statement Progress toward home exercise goals.  Lateral flexion to Left increased her headach pain.     PT Next Visit Plan review Neck AROM and measure   PT Home Exercise Plan Neck AROM      Patient will benefit from skilled therapeutic intervention in order to improve the following deficits and impairments:  Pain, Improper body mechanics, Postural dysfunction, Hypomobility, Decreased range of motion, Decreased activity tolerance, Decreased endurance, Increased fascial restricitons  Visit Diagnosis: Cervicalgia  Other muscle spasm  Abnormal posture     Problem List Patient Active Problem List   Diagnosis Date Noted  . Hypertensive heart disease 05/25/2015  . Type II or unspecified type diabetes mellitus without mention of complication, uncontrolled 02/14/2014  . Hyponatremia 02/13/2014  . HTN (hypertension)  02/13/2014  . Sepsis secondary to UTI ([place]) 02/12/2014  . Acute pyelonephritis 02/12/2014  . Unstable angina ([place]) 07/29/2012  . Obesity (BMI 30-39.9) 07/29/2012  .  Bloating 06/15/2012  . Generalized abdominal pain 06/15/2012  . Family history of colon cancer 06/15/2012  . 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 Coronary artery disease 12/30/2010  . Hyperlipidemia 12/30/2010  . Diabetes mellitus ([place]) 12/30/2010  . RECTAL INCONTINENCE 06/13/2008    HARRIS,KAREN 04/23/2016, 6:35 PM  [facility] [address] [place], [place], [address] Phone: [phone]   Fax:  [phone]  Name: KYIA RHUDE MRN: [id number] Date of Birth: 11-07-1945   Melvenia Needles, PTA 04/23/16 6:35 PM Phone: [phone] Fax: [phone]

## 2016-04-28 ENCOUNTER — Encounter: Payer: Medicare Other | Admitting: Physical Therapy

## 2016-04-30 ENCOUNTER — Ambulatory Visit: Payer: Medicare Other | Admitting: Physical Therapy

## 2016-04-30 DIAGNOSIS — M542 Cervicalgia: Secondary | ICD-10-CM

## 2016-04-30 DIAGNOSIS — M62838 Other muscle spasm: Secondary | ICD-10-CM

## 2016-04-30 DIAGNOSIS — R293 Abnormal posture: Secondary | ICD-10-CM
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
## 2016-05-05 ENCOUNTER — Ambulatory Visit: Payer: Medicare Other | Admitting: Physical Therapy

## 2016-05-05 DIAGNOSIS — M542 Cervicalgia: Secondary | ICD-10-CM

## 2016-05-05 DIAGNOSIS — R293 Abnormal posture: Secondary | ICD-10-CM

## 2016-05-05 DIAGNOSIS — M62838 Other muscle spasm: Secondary | ICD-10-CM

## 2016-05-05 NOTE — Therapy (Signed)
[place], [place], [address] Phone: [phone]   Fax:  [phone]  Physical Therapy Treatment  Patient Details  Name: Ruth [name] MRN: [id number] Date of Birth: [birth year] Referring Provider: Crist Infante MD  Encounter Date: 05/05/2016      PT End of Session - 05/05/16 1815    Visit Number 7   Number of Visits 13   Date for PT Re-Evaluation 05/13/16   PT Start Time 0937   PT Stop Time 1020   PT Time Calculation (min) 43 min   Activity Tolerance Patient tolerated treatment well   Behavior During Therapy [facility] for tasks assessed/performed      Past Medical History:  Diagnosis Date  . Anxiety   . CAD (coronary artery disease)    a. CABG x 4 in 2006 in [place]. b. DES to midbody of SVG-PDA/PLA 07/2012.  Marland Kitchen Complication of anesthesia    "quit breathing when I got ?Inovar" (07/28/2012)  . Depression   . Diverticulosis   . Dyslipidemia    Intolerant of statins  . Fecal incontinence   . GERD (gastroesophageal reflux disease)   . Hyperlipidemia   . Hyperplastic colon polyp   . IBS (irritable bowel syndrome)   . Labile hypertension   . Migraines    "had them in my 30's" (07/28/2012)  . Multiple allergies   . Obesity   . Obesity   . Steatohepatitis   . Type II diabetes mellitus (HCC)    no meds, diet controlled    Past Surgical History:  Procedure Laterality Date  . ABDOMINAL HYSTERECTOMY  1970's  . BREAST LUMPECTOMY     bilateral  . CARDIAC CATHETERIZATION  2006  . CORONARY ANGIOPLASTY WITH STENT PLACEMENT  07/28/2012   "1; first time for me" (07/28/2012)  . CORONARY ARTERY BYPASS GRAFT  2006   CABG X4  . EXCISIONAL HEMORRHOIDECTOMY  1970's  . INGUINAL HERNIA REPAIR  1970's?   left  . LEFT HEART CATHETERIZATION WITH CORONARY/GRAFT ANGIOGRAM N/A 07/28/2012   Procedure: LEFT HEART CATHETERIZATION WITH Beatrix Fetters;  Surgeon: Burnell Blanks, MD;  Location: [facility]  CATH LAB;  Service: Cardiovascular;  Laterality: N/A;  . PERCUTANEOUS CORONARY STENT INTERVENTION (PCI-S)  07/28/2012   Procedure: PERCUTANEOUS CORONARY STENT INTERVENTION (PCI-S);  Surgeon: Burnell Blanks, MD;  Location: [facility] CATH LAB;  Service: Cardiovascular;;  . TONSILLECTOMY AND ADENOIDECTOMY  ~ 1951    There were no vitals filed for this visit.      Subjective Assessment - 05/05/16 1805    Subjective Less pain today.  I was able to mow the yard with less pain.     Currently in Pain? Yes   Pain Score 3    Pain Location Neck   Pain Orientation Left;Right;Mid   Pain Descriptors / Indicators --  sore   Pain Radiating Towards not reported today,     Pain Frequency Constant   Aggravating Factors  moving head end ranges.    Pain Relieving Factors exercise, posture changes, massage   Multiple Pain Sites No            [facility] PT Assessment - 05/05/16 0001      AROM   Cervical Flexion 44   Cervical Extension 34   Cervical - Right Side Bend 30   Cervical - Left Side Bend 30   Cervical - Right Rotation 52   Cervical - Left Rotation 48  [facility] Adult PT Treatment/Exercise - 05/05/16 0001      Therapeutic Activites    Therapeutic Activities Other Therapeutic Activities   Other Therapeutic Activities simulates puss/pull lawnmower with 20 pounds on cart vs sled.  patient demonstrated good technique.      Neck Exercises: Seated   Cervical Rotation --  AROM 3 X   Lateral Flexion --  AROM 3 X     Shoulder Exercises: ROM/Strengthening   UBE (Upper Arm Bike) Nu step L 4 initially then decreased to 2/10 6 minutes arms and legs.   UBE Level 1,  4 minutes stopped due to chest pain.  forward.     Manual Therapy   Manual Therapy Soft tissue mobilization   Manual therapy comments upper traps paraspinals, scaleens,  Pecs less tight than usual,  Strumming to lengthen with active stretch of neck                  PT Short Term Goals -  05/05/16 1820      PT SHORT TERM GOAL #1   Title pt will be I with inital HEP (04/22/2016)   Baseline she says she is independent   Time 3   Period Weeks   Status Achieved     PT SHORT TERM GOAL #2   Title pt will verbalize / demo proper posture and lifting/ carrying mechanics to prevent / reduce muscle tightness and neck pain (04/22/2016)   Baseline can demo correct technique with push /pull simulation   Time 3   Period Weeks   Status Partially Met           PT Long Term Goals - 04/01/16 1108      PT LONG TERM GOAL #1   Title pt will be I with all advanced HEP given (05/13/2016)   Time 6   Period Weeks   Status New     PT LONG TERM GOAL #2   Title pt will demonstrate reduce shoulder muscle tightness to reduce pain to </= 1/10 pain and improve cervical mobility (05/13/2016)   Time 6   Period Weeks   Status New     PT LONG TERM GOAL #3   Title She will improve cervical flexion/ extension by >/= 10 degrees  and cervical side bending/ rotation by >/= 8 degrees bil to assist with safety during driving and ADLs (8/0/9983)   Time 6   Period Weeks   Status New     PT LONG TERM GOAL #4   Title Increaes FOTO score to </=37% limited to demonstrate improvement in function (05/13/2016)   Time 6   Period Weeks   Status New     PT LONG TERM GOAL #5   Title pt will be able to push/ pull >/= 15#, and lift / carry >/= 8# at or above her head to assist with functional lifting and carrying with </=2/10 pain and to assist with personal goal of doing yard work (05/13/2016)   Time 6   Period Weeks   Status New               Plan - 05/05/16 1816    Clinical Impression Statement STG #2 met,  STG#3 partially met. Progress toward ADL goal. Patient had chest pain while on UBE toady and briefly after.  She has these pains from time to time and does nothing about them. Alease Medina her to to report to MD,  She says she is aware.   PT Next Visit  Plan Try lifting to work on long term goals.      PT Home Exercise Plan continue   Consulted and Agree with Plan of Care Patient      Patient will benefit from skilled therapeutic intervention in order to improve the following deficits and impairments:  Pain, Improper body mechanics, Postural dysfunction, Hypomobility, Decreased range of motion, Decreased activity tolerance, Decreased endurance, Increased fascial restricitons  Visit Diagnosis: Cervicalgia  Abnormal posture  Other muscle spasm     Problem List Patient Active Problem List   Diagnosis Date Noted  . Hypertensive heart disease 05/25/2015  . Type II or unspecified type diabetes mellitus without mention of complication, uncontrolled 02/14/2014  . Hyponatremia 02/13/2014  . HTN (hypertension) 02/13/2014  . Sepsis secondary to UTI ([place]) 02/12/2014  . Acute pyelonephritis 02/12/2014  . Unstable angina ([place]) 07/29/2012  . Obesity (BMI 30-39.9) 07/29/2012  . Bloating 06/15/2012  . Generalized abdominal pain 06/15/2012  . Family history of colon cancer 06/15/2012  . 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 Coronary artery disease 12/30/2010  . Hyperlipidemia 12/30/2010  . Diabetes mellitus ([place]) 12/30/2010  . RECTAL INCONTINENCE 06/13/2008    HARRIS,KAREN 05/05/2016, 6:24 PM  [facility] [address] [place], [place], [address] Phone: [phone]   Fax:  [phone]  Name: Ruth [name] MRN: [id number] Date of Birth: 15-May-1946   Melvenia Needles, PTA 05/05/16 6:24 PM Phone: [phone] Fax: [phone]

## 2016-05-07 ENCOUNTER — Ambulatory Visit: Payer: Medicare Other | Admitting: Physical Therapy

## 2016-05-07 DIAGNOSIS — M542 Cervicalgia: Secondary | ICD-10-CM | POA: Diagnosis not present

## 2016-05-07 DIAGNOSIS — R293 Abnormal posture: Secondary | ICD-10-CM

## 2016-05-07 DIAGNOSIS — M62838 Other muscle spasm: Secondary | ICD-10-CM

## 2016-05-07 NOTE — Therapy (Signed)
[place], [place], [address] Phone: [phone]   Fax:  [phone]  Physical Therapy Treatment  Patient Details  Name: Ruth [name] MRN: [id number] Date of Birth: 1946/01/19 Referring Provider: Crist Infante MD  Encounter Date: 05/07/2016      PT End of Session - 05/07/16 1355    Visit Number 8   Number of Visits 13   Date for PT Re-Evaluation 05/13/16   PT Start Time 4818   PT Stop Time 1100   PT Time Calculation (min) 45 min   Activity Tolerance Patient tolerated treatment well   Behavior During Therapy [facility] for tasks assessed/performed      Past Medical History:  Diagnosis Date  . Anxiety   . CAD (coronary artery disease)    a. CABG x 4 in 2006 in [place]. b. DES to midbody of SVG-PDA/PLA 07/2012.  Marland Kitchen Complication of anesthesia    "quit breathing when I got ?Inovar" (07/28/2012)  . Depression   . Diverticulosis   . Dyslipidemia    Intolerant of statins  . Fecal incontinence   . GERD (gastroesophageal reflux disease)   . Hyperlipidemia   . Hyperplastic colon polyp   . IBS (irritable bowel syndrome)   . Labile hypertension   . Migraines    "had them in my 30's" (07/28/2012)  . Multiple allergies   . Obesity   . Obesity   . Steatohepatitis   . Type II diabetes mellitus (HCC)    no meds, diet controlled    Past Surgical History:  Procedure Laterality Date  . ABDOMINAL HYSTERECTOMY  1970's  . BREAST LUMPECTOMY     bilateral  . CARDIAC CATHETERIZATION  2006  . CORONARY ANGIOPLASTY WITH STENT PLACEMENT  07/28/2012   "1; first time for me" (07/28/2012)  . CORONARY ARTERY BYPASS GRAFT  2006   CABG X4  . EXCISIONAL HEMORRHOIDECTOMY  1970's  . INGUINAL HERNIA REPAIR  1970's?   left  . LEFT HEART CATHETERIZATION WITH CORONARY/GRAFT ANGIOGRAM N/A 07/28/2012   Procedure: LEFT HEART CATHETERIZATION WITH Beatrix Fetters;  Surgeon: Burnell Blanks, MD;  Location: [facility]  CATH LAB;  Service: Cardiovascular;  Laterality: N/A;  . PERCUTANEOUS CORONARY STENT INTERVENTION (PCI-S)  07/28/2012   Procedure: PERCUTANEOUS CORONARY STENT INTERVENTION (PCI-S);  Surgeon: Burnell Blanks, MD;  Location: [facility] CATH LAB;  Service: Cardiovascular;;  . TONSILLECTOMY AND ADENOIDECTOMY  ~ 1951    There were no vitals filed for this visit.      Subjective Assessment - 05/07/16 1020    Subjective Lt thigh cramps10/10,  Ankle sore too.Left 9/10. Neck, shoulder 4/10.  ABle to rake 40 minutes and groomed 4 dogs yeaterday.I think i need massage and stretches today vs using the machines.      Currently in Pain? Yes   Pain Score 4    Pain Location Neck   Pain Descriptors / Indicators --  sore   Pain Radiating Towards no   Pain Frequency Constant   Aggravating Factors  Activity   Pain Relieving Factors soaking in bath   Multiple Pain Sites --  has ankle and thigh cramps, some swelling lateral y  advised  patient to see  MD if she continues to have high pain levels.                           [facility] Adult PT Treatment/Exercise - 05/07/16 0001      Manual Therapy  Manual Therapy Soft tissue mobilization   Manual therapy comments upper traps paraspinals, scaleens,  Pecs less tight than usual,  Strumming to lengthen with active stretch of neck     Neck Exercises: Stretches   Upper Trapezius Stretch 3 reps;20 seconds   Levator Stretch 3 reps;20 seconds   Corner Stretch 3 reps;30 seconds  1 arm in doorway.  HEP  Extra time spent with education about exercise and delayed soreness that happens .  How to ease back into exercising.                    PT Short Term Goals - 05/05/16 1820      PT SHORT TERM GOAL #1   Title pt will be I with inital HEP (04/22/2016)   Baseline she says she is independent   Time 3   Period Weeks   Status Achieved     PT SHORT TERM GOAL #2   Title pt will verbalize / demo proper posture and lifting/ carrying  mechanics to prevent / reduce muscle tightness and neck pain (04/22/2016)   Baseline can demo correct technique with push /pull simulation   Time 3   Period Weeks   Status Partially Met           PT Long Term Goals - 04/01/16 1108      PT LONG TERM GOAL #1   Title pt will be I with all advanced HEP given (05/13/2016)   Time 6   Period Weeks   Status New     PT LONG TERM GOAL #2   Title pt will demonstrate reduce shoulder muscle tightness to reduce pain to </= 1/10 pain and improve cervical mobility (05/13/2016)   Time 6   Period Weeks   Status New     PT LONG TERM GOAL #3   Title She will improve cervical flexion/ extension by >/= 10 degrees  and cervical side bending/ rotation by >/= 8 degrees bil to assist with safety during driving and ADLs (10/12/3662)   Time 6   Period Weeks   Status New     PT LONG TERM GOAL #4   Title Increaes FOTO score to </=37% limited to demonstrate improvement in function (05/13/2016)   Time 6   Period Weeks   Status New     PT LONG TERM GOAL #5   Title pt will be able to push/ pull >/= 15#, and lift / carry >/= 8# at or above her head to assist with functional lifting and carrying with </=2/10 pain and to assist with personal goal of doing yard work (05/13/2016)   Time 6   Period Weeks   Status New               Plan - 05/07/16 1356    Clinical Impression Statement Pain reported by patient was not physically demonstrated non verbally.  Her Left thigh is larger than right with varicose veins.  Ankle tender lateral ankle, She is to tell her MD about .  Session focused on ROM/Manual at patient's request.  No new goals met, however Neck AROM appeard to be improving with increased function with driving,  yard work and care of dogs. nder    PT Next Visit Plan Try lifting to work on long term goals if she is ready.     PT Home Exercise Plan Single arm doorway stretch   Consulted and Agree with Plan of Care Patient      Patient will  benefit from  skilled therapeutic intervention in order to improve the following deficits and impairments:  Pain, Improper body mechanics, Postural dysfunction, Hypomobility, Decreased range of motion, Decreased activity tolerance, Decreased endurance, Increased fascial restricitons  Visit Diagnosis: Cervicalgia  Abnormal posture  Other muscle spasm     Problem List Patient Active Problem List   Diagnosis Date Noted  . Hypertensive heart disease 05/25/2015  . Type II or unspecified type diabetes mellitus without mention of complication, uncontrolled 02/14/2014  . Hyponatremia 02/13/2014  . HTN (hypertension) 02/13/2014  . Sepsis secondary to UTI ([place]) 02/12/2014  . Acute pyelonephritis 02/12/2014  . Unstable angina ([place]) 07/29/2012  . Obesity (BMI 30-39.9) 07/29/2012  . Bloating 06/15/2012  . Generalized abdominal pain 06/15/2012  . Family history of colon cancer 06/15/2012  . 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 Coronary artery disease 12/30/2010  . Hyperlipidemia 12/30/2010  . Diabetes mellitus ([place]) 12/30/2010  . RECTAL INCONTINENCE 06/13/2008    HARRIS,KAREN 05/07/2016, 2:12 PM  [facility] [address] [place], [place], [address] Phone: [phone]   Fax:  [phone]  Name: Ruth [name] MRN: [id number] Date of Birth: 1946/07/04   Melvenia Needles, PTA 05/07/16 2:13 PM Phone: [phone] Fax: [phone]

## 2016-05-07 NOTE — Patient Instructions (Addendum)
Contact MD about chest pain, thigh pain, ankle pain.   Single arm door way stretch issued from exercise drawer.  1 x a day, 3 X each, 30 seconds hold.

## 2016-05-12 ENCOUNTER — Ambulatory Visit: Payer: Medicare Other | Attending: Internal Medicine | Admitting: Physical Therapy

## 2016-05-12 DIAGNOSIS — R293 Abnormal posture: Secondary | ICD-10-CM | POA: Diagnosis present

## 2016-05-12 DIAGNOSIS — M542 Cervicalgia: Secondary | ICD-10-CM | POA: Diagnosis not present

## 2016-05-12 DIAGNOSIS — M62838 Other muscle spasm: Secondary | ICD-10-CM | POA: Diagnosis present

## 2016-05-12 NOTE — Therapy (Signed)
[place], [place], [address] Phone: [phone]   Fax:  [phone]  Physical Therapy Treatment  Patient Details  Name: Ruth [name] MRN: [id number] Date of Birth: [birth year] Referring Provider: Crist Infante MD  Encounter Date: 05/12/2016      PT End of Session - 05/12/16 1808    Visit Number 9   Number of Visits 13   Date for PT Re-Evaluation 05/13/16   PT Start Time 0934   PT Stop Time 1017   PT Time Calculation (min) 43 min   Activity Tolerance Patient tolerated treatment well   Behavior During Therapy [facility] for tasks assessed/performed      Past Medical History:  Diagnosis Date  . Anxiety   . CAD (coronary artery disease)    a. CABG x 4 in 2006 in [place]. b. DES to midbody of SVG-PDA/PLA 07/2012.  Marland Kitchen Complication of anesthesia    "quit breathing when I got ?Inovar" (07/28/2012)  . Depression   . Diverticulosis   . Dyslipidemia    Intolerant of statins  . Fecal incontinence   . GERD (gastroesophageal reflux disease)   . Hyperlipidemia   . Hyperplastic colon polyp   . IBS (irritable bowel syndrome)   . Labile hypertension   . Migraines    "had them in my 30's" (07/28/2012)  . Multiple allergies   . Obesity   . Obesity   . Steatohepatitis   . Type II diabetes mellitus (HCC)    no meds, diet controlled    Past Surgical History:  Procedure Laterality Date  . ABDOMINAL HYSTERECTOMY  1970's  . BREAST LUMPECTOMY     bilateral  . CARDIAC CATHETERIZATION  2006  . CORONARY ANGIOPLASTY WITH STENT PLACEMENT  07/28/2012   "1; first time for me" (07/28/2012)  . CORONARY ARTERY BYPASS GRAFT  2006   CABG X4  . EXCISIONAL HEMORRHOIDECTOMY  1970's  . INGUINAL HERNIA REPAIR  1970's?   left  . LEFT HEART CATHETERIZATION WITH CORONARY/GRAFT ANGIOGRAM N/A 07/28/2012   Procedure: LEFT HEART CATHETERIZATION WITH Beatrix Fetters;  Surgeon: Burnell Blanks, MD;  Location: [facility]  CATH LAB;  Service: Cardiovascular;  Laterality: N/A;  . PERCUTANEOUS CORONARY STENT INTERVENTION (PCI-S)  07/28/2012   Procedure: PERCUTANEOUS CORONARY STENT INTERVENTION (PCI-S);  Surgeon: Burnell Blanks, MD;  Location: [facility] CATH LAB;  Service: Cardiovascular;;  . TONSILLECTOMY AND ADENOIDECTOMY  ~ 1951    There were no vitals filed for this visit.      Subjective Assessment - 05/12/16 0952    Subjective 8/10 pain left ankle.  Neck 3/10.  Neck does not wake her at night.     Currently in Pain? Yes   Pain Score 3    Pain Location Neck   Pain Orientation Left   Pain Descriptors / Indicators Aching   Pain Frequency Constant   Aggravating Factors  sitting longer   Pain Relieving Factors spaking   Multiple Pain Sites --  left ankle,  worse with weight bearing,   Wakes her at night                         [facility] Adult PT Treatment/Exercise - 05/12/16 0001      Shoulder Exercises: Standing   External Rotation 10 reps   Theraband Level (Shoulder External Rotation) Level 1 (Yellow)   Extension 10 reps   Theraband Level (Shoulder Extension) Level 1 (Yellow)   Row 10 reps  Theraband Level (Shoulder Row) Level 2 (Red)   Row Limitations cues   Other Standing Exercises posture standing with abdominals  engaged5 x     Shoulder Exercises: ROM/Strengthening   UBE (Upper Arm Bike) Nustep L4 8 minutes   Other ROM/Strengthening Exercises wall push up 10 X     Manual Therapy   Manual Therapy Soft tissue mobilization   Manual therapy comments upper traps paraspinals, scaleens,  Pecs less tight than usual,  Strumming to lengthen with active stretch of neck                  PT Short Term Goals - 05/12/16 1815      PT SHORT TERM GOAL #1   Title pt will be I with inital HEP (04/22/2016)   Time 3   Period Weeks   Status Achieved     PT SHORT TERM GOAL #2   Title pt will verbalize / demo proper posture and lifting/ carrying mechanics to prevent / reduce  muscle tightness and neck pain (04/22/2016)   Time 3   Period Weeks   Status Partially Met           PT Long Term Goals - 05/12/16 1815      PT LONG TERM GOAL #1   Title pt will be I with all advanced HEP given (05/13/2016)   Time 6   Period Weeks   Status On-going     PT LONG TERM GOAL #2   Title pt will demonstrate reduce shoulder muscle tightness to reduce pain to </= 1/10 pain and improve cervical mobility (05/13/2016)   Baseline 4/10 pain    Time 6   Period Weeks   Status On-going     PT LONG TERM GOAL #3   Title She will improve cervical flexion/ extension by >/= 10 degrees  and cervical side bending/ rotation by >/= 8 degrees bil to assist with safety during driving and ADLs (04/14/5026)   Time 6   Period Weeks   Status Unable to assess     PT LONG TERM GOAL #4   Title Increaes FOTO score to </=37% limited to demonstrate improvement in function (05/13/2016)   Time 6   Period Weeks   Status Unable to assess     PT LONG TERM GOAL #5   Title pt will be able to push/ pull >/= 15#, and lift / carry >/= 8# at or above her head to assist with functional lifting and carrying with </=2/10 pain and to assist with personal goal of doing yard work (05/13/2016)   Time 6   Period Weeks   Status Unable to assess               Plan - 05/12/16 1809    Clinical Impression Statement Patient gradually participating in strengthening.  She declines some exercises from fear of hurting her chest. (Open heart surgery)No objective measured assessed today due to lack of time. Pain levels have consistantly been decreasing over the last week. I am not sure how consistant her home exercises have been done.   PT Next Visit Plan ERO, FOTO    PT Home Exercise Plan continue   Consulted and Agree with Plan of Care Patient      Patient will benefit from skilled therapeutic intervention in order to improve the following deficits and impairments:  Pain, Improper body mechanics, Postural  dysfunction, Hypomobility, Decreased range of motion, Decreased activity tolerance, Decreased endurance, Increased fascial restricitons  Visit Diagnosis: Cervicalgia  Abnormal posture  Other muscle spasm     Problem List Patient Active Problem List   Diagnosis Date Noted  . Hypertensive heart disease 05/25/2015  . Type II or unspecified type diabetes mellitus without mention of complication, uncontrolled 02/14/2014  . Hyponatremia 02/13/2014  . HTN (hypertension) 02/13/2014  . Sepsis secondary to UTI ([place]) 02/12/2014  . Acute pyelonephritis 02/12/2014  . Unstable angina ([place]) 07/29/2012  . Obesity (BMI 30-39.9) 07/29/2012  . Bloating 06/15/2012  . Generalized abdominal pain 06/15/2012  . Family history of colon cancer 06/15/2012  . 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 Coronary artery disease 12/30/2010  . Hyperlipidemia 12/30/2010  . Diabetes mellitus ([place]) 12/30/2010  . RECTAL INCONTINENCE 06/13/2008    Derrian Rodak 05/12/2016, 6:17 PM  [facility] [address] [place], [place], [address] Phone: [phone]   Fax:  [phone]  Name: Ruth [name] MRN: [id number] Date of Birth: 09-Nov-1945   Melvenia Needles, PTA 05/12/16 6:17 PM Phone: [phone] Fax: [phone]

## 2016-05-14 ENCOUNTER — Ambulatory Visit: Payer: Medicare Other | Admitting: Physical Therapy

## 2016-05-16 ENCOUNTER — Other Ambulatory Visit: Payer: Self-pay | Admitting: Internal Medicine

## 2016-05-16 DIAGNOSIS — Z1231 Encounter for screening mammogram for malignant neoplasm of breast: Secondary | ICD-10-CM

## 2016-05-18 ENCOUNTER — Ambulatory Visit: Payer: Medicare Other | Admitting: Physical Therapy

## 2016-05-18 DIAGNOSIS — M542 Cervicalgia: Secondary | ICD-10-CM | POA: Diagnosis not present

## 2016-05-18 DIAGNOSIS — M62838 Other muscle spasm: Secondary | ICD-10-CM

## 2016-05-18 DIAGNOSIS — R293 Abnormal posture: Secondary | ICD-10-CM

## 2016-05-18 NOTE — Therapy (Signed)
[place], [place], [address] Phone: [phone]   Fax:  [phone]  Physical Therapy Treatment / Re-certification Note  Patient Details  Name: ARLYNE BRANDES MRN: [id number] Date of Birth: May 22, 1946 Referring Provider: Crist Infante MD  Encounter Date: 05/18/2016      PT End of Session - 05/18/16 1042    Visit Number 10   Number of Visits 13   Date for PT Re-Evaluation 06/29/16   PT Start Time 1000   PT Stop Time 1049   PT Time Calculation (min) 49 min   Activity Tolerance Patient tolerated treatment well   Behavior During Therapy [facility] for tasks assessed/performed      Past Medical History:  Diagnosis Date  . Anxiety   . CAD (coronary artery disease)    a. CABG x 4 in 2006 in [place]. b. DES to midbody of SVG-PDA/PLA 07/2012.  Marland Kitchen Complication of anesthesia    "quit breathing when I got ?Inovar" (07/28/2012)  . Depression   . Diverticulosis   . Dyslipidemia    Intolerant of statins  . Fecal incontinence   . GERD (gastroesophageal reflux disease)   . Hyperlipidemia   . Hyperplastic colon polyp   . IBS (irritable bowel syndrome)   . Labile hypertension   . Migraines    "had them in my 30's" (07/28/2012)  . Multiple allergies   . Obesity   . Obesity   . Steatohepatitis   . Type II diabetes mellitus (HCC)    no meds, diet controlled    Past Surgical History:  Procedure Laterality Date  . ABDOMINAL HYSTERECTOMY  1970's  . BREAST LUMPECTOMY     bilateral  . CARDIAC CATHETERIZATION  2006  . CORONARY ANGIOPLASTY WITH STENT PLACEMENT  07/28/2012   "1; first time for me" (07/28/2012)  . CORONARY ARTERY BYPASS GRAFT  2006   CABG X4  . EXCISIONAL HEMORRHOIDECTOMY  1970's  . INGUINAL HERNIA REPAIR  1970's?   left  . LEFT HEART CATHETERIZATION WITH CORONARY/GRAFT ANGIOGRAM N/A 07/28/2012   Procedure: LEFT HEART CATHETERIZATION WITH Beatrix Fetters;  Surgeon: Burnell Blanks, MD;  Location: [facility] CATH LAB;  Service: Cardiovascular;  Laterality: N/A;  . PERCUTANEOUS CORONARY STENT INTERVENTION (PCI-S)  07/28/2012   Procedure: PERCUTANEOUS CORONARY STENT INTERVENTION (PCI-S);  Surgeon: Burnell Blanks, MD;  Location[facility] CATH LAB;  Service: Cardiovascular;;  . TONSILLECTOMY AND ADENOIDECTOMY  ~ 1951    There were no vitals filed for this visit.      Subjective Assessment - 05/18/16 0952    Subjective "I am not feeling good because this weekend, it seems to be worse with sitting outside and it has been hurting which makes me feel sick to my stomach"   Currently in Pain? Yes   Pain Score 4    Pain Location Neck   Pain Orientation Left   Pain Descriptors / Indicators Aching   Pain Onset More than a month ago   Pain Frequency Intermittent   Aggravating Factors  prolonged sitting   Pain Relieving Factors nothing            [facility] PT Assessment - 05/18/16 1009      Observation/Other Assessments   Focus on Therapeutic Outcomes (FOTO)  50% limited     AROM   Cervical Flexion 60   Cervical Extension 40   Cervical - Right Side Bend 38   Cervical - Left Side Bend 36   Cervical - Right Rotation  76   Cervical - Left Rotation 68                     [facility] Adult PT Treatment/Exercise - 05/18/16 0001      Neck Exercises: Seated   Other Seated Exercise seated pelvic tilts 2 x 10, 1 x 10 standing  verbal / visual cues for form (HEP)     Shoulder Exercises: Seated   Other Seated Exercises thoracic extension over back of chair 2 x 10   verbal cues to look at floor to avoid cervical ext     Manual Therapy   Manual therapy comments sub-occipital release x 5 min     Neck Exercises: Stretches   Upper Trapezius Stretch 1 rep;30 seconds   Levator Stretch 1 rep;30 seconds   Corner Stretch 2 reps;30 seconds                PT Education - 05/18/16 1052    Education provided Yes   Education Details reviewed HEP and importance  of doing HEP every day to help correct imbalance, posture especially with sitting using roll in lumbar region to decrease pain. updated HEP and discussed pt progress and updated POC.   Person(s) Educated Patient   Methods Explanation;Verbal cues;Handout   Comprehension Verbalized understanding;Verbal cues required          PT Short Term Goals - 05/18/16 1016      PT SHORT TERM GOAL #1   Title pt will be I with inital HEP (04/22/2016)   Time 3   Period Weeks   Status Achieved     PT SHORT TERM GOAL #2   Title pt will verbalize / demo proper posture and lifting/ carrying mechanics to prevent / reduce muscle tightness and neck pain (04/22/2016)   Baseline able to verbalize correct posture   Time 3   Period Weeks   Status Achieved           PT Long Term Goals - 05/18/16 1017      PT LONG TERM GOAL #1   Title pt will be I with all advanced HEP given (05/13/2016)   Time 6   Period Weeks   Status On-going     PT LONG TERM GOAL #2   Title pt will demonstrate reduce shoulder muscle tightness to reduce pain to </= 1/10 pain and improve cervical mobility (05/13/2016)   Baseline 4/10 pain    Time 6   Period Weeks   Status On-going     PT LONG TERM GOAL #3   Title She will improve cervical flexion/ extension by >/= 10 degrees  and cervical side bending/ rotation by >/= 8 degrees bil to assist with safety during driving and ADLs (2/0/1007)   Baseline flexion 60 degrees, met sidebending/ rotation,    Time 6   Period Weeks   Status Achieved     PT LONG TERM GOAL #4   Title Increaes FOTO score to </=37% limited to demonstrate improvement in function (05/13/2016)   Time 6   Period Weeks   Status On-going     PT LONG TERM GOAL #5   Title pt will be able to push/ pull >/= 15#, and lift / carry >/= 8# at or above her head to assist with functional lifting and carrying with </=2/10 pain and to assist with personal goal of doing yard work (05/13/2016)   Period Weeks   Status On-going  Plan - 2016/06/12 1053    Clinical Impression Statement Mrs. Lonsway reports increased pain today from prolonged sitting. following sitting posture correction and thoracic extension exercises she reported significant relief of pain rpeorted at 2/10. pt is improving with cerivcal moblity with decreased pain and tightness. pt me tall STg's today and is progressing with LTG's. plan to continue with her current POC to finish out her remaining visits to work toward her remaining goals, and independent exercise.    Rehab Potential Good   PT Frequency 1x / week   PT Duration 3 weeks   PT Treatment/Interventions ADLs/Self Care Home Management;Cryotherapy;Electrical Stimulation;Iontophoresis 74m/ml Dexamethasone;Passive range of motion;Taping;Therapeutic exercise;Therapeutic activities;Ultrasound;Moist Heat;Manual techniques;Patient/family education;Dry needling   PT Next Visit Plan review posture, thoracic exercises for mobility, progressing away from manual and more toward exercise (discussed this with pt)   PT Home Exercise Plan thoracic extension over chair, pelvic tilt in sitting   Consulted and Agree with Plan of Care Patient      Patient will benefit from skilled therapeutic intervention in order to improve the following deficits and impairments:  Pain, Improper body mechanics, Postural dysfunction, Hypomobility, Decreased range of motion, Decreased activity tolerance, Decreased endurance, Increased fascial restricitons  Visit Diagnosis: Cervicalgia - Plan: PT plan of care cert/re-cert  Abnormal posture - Plan: PT plan of care cert/re-cert  Other muscle spasm - Plan: PT plan of care cert/re-cert       G-Codes - 010/06/20171057    Functional Assessment Tool Used FOTO/ Clinical judgement   Functional Limitation Carrying, moving and handling objects   Carrying, Moving and Handling Objects Current Status ((X2820 At least 40 percent but less than 60 percent impaired, limited  or restricted   Carrying, Moving and Handling Objects Goal Status ((S1388 At least 20 percent but less than 40 percent impaired, limited or restricted      Problem List Patient Active Problem List   Diagnosis Date Noted  . Hypertensive heart disease 05/25/2015  . Type II or unspecified type diabetes mellitus without mention of complication, uncontrolled 02/14/2014  . Hyponatremia 02/13/2014  . HTN (hypertension) 02/13/2014  . Sepsis secondary to UTI ([place] 02/12/2014  . Acute pyelonephritis 02/12/2014  . Unstable angina ([place] 07/29/2012  . Obesity (BMI 30-39.9) 07/29/2012  . Bloating 06/15/2012  . Generalized abdominal pain 06/15/2012  . Family history of colon cancer 06/15/2012  . 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 Coronary artery disease 12/30/2010  . Hyperlipidemia 12/30/2010  . Diabetes mellitus ([place] 12/30/2010  . RECTAL INCONTINENCE 06/13/2008   KStarr LakePT, DPT, LAT, ATC  0Oct 06, 2017 11:03 AM      [facility]GElmsford [place] [address]Phone: [phone]  Fax:  [phone] Name: DAREIL OTTEYMRN: [id number] of Birth: 505-02-1946

## 2016-05-27 ENCOUNTER — Ambulatory Visit: Payer: Medicare Other | Admitting: Physical Therapy

## 2016-05-27 DIAGNOSIS — M542 Cervicalgia: Secondary | ICD-10-CM | POA: Diagnosis not present

## 2016-05-27 DIAGNOSIS — R293 Abnormal posture: Secondary | ICD-10-CM

## 2016-05-27 DIAGNOSIS — M62838 Other muscle spasm: Secondary | ICD-10-CM

## 2016-05-27 NOTE — Patient Instructions (Signed)
   Take breaks throughout activity if planning on doing prolong tasks that last longer than an hour. Set a timer for for 30 minutes and take a 5 minute break; then resume activity.

## 2016-05-27 NOTE — Therapy (Signed)
[place], [place], [address] Phone: [phone]   Fax:  [phone]  Physical Therapy Treatment  Patient Details  Name: Ruth [name] MRN: [id number] Date of Birth: [birth year] Referring Provider: Crist Infante MD  Encounter Date: 05/27/2016      PT End of Session - 05/27/16 1101    Visit Number 11   Number of Visits 13   Date for PT Re-Evaluation 06/29/16   PT Start Time 1016   PT Stop Time 1059   PT Time Calculation (min) 43 min   Activity Tolerance Patient tolerated treatment well   Behavior During Therapy [facility] for tasks assessed/performed      Past Medical History:  Diagnosis Date  . Anxiety   . CAD (coronary artery disease)    a. CABG x 4 in 2006 in [place]. b. DES to midbody of SVG-PDA/PLA 07/2012.  Marland Kitchen Complication of anesthesia    "quit breathing when I got ?Inovar" (07/28/2012)  . Depression   . Diverticulosis   . Dyslipidemia    Intolerant of statins  . Fecal incontinence   . GERD (gastroesophageal reflux disease)   . Hyperlipidemia   . Hyperplastic colon polyp   . IBS (irritable bowel syndrome)   . Labile hypertension   . Migraines    "had them in my 30's" (07/28/2012)  . Multiple allergies   . Obesity   . Obesity   . Steatohepatitis   . Type II diabetes mellitus (HCC)    no meds, diet controlled    Past Surgical History:  Procedure Laterality Date  . ABDOMINAL HYSTERECTOMY  1970's  . BREAST LUMPECTOMY     bilateral  . CARDIAC CATHETERIZATION  2006  . CORONARY ANGIOPLASTY WITH STENT PLACEMENT  07/28/2012   "1; first time for me" (07/28/2012)  . CORONARY ARTERY BYPASS GRAFT  2006   CABG X4  . EXCISIONAL HEMORRHOIDECTOMY  1970's  . INGUINAL HERNIA REPAIR  1970's?   left  . LEFT HEART CATHETERIZATION WITH CORONARY/GRAFT ANGIOGRAM N/A 07/28/2012   Procedure: LEFT HEART CATHETERIZATION WITH Beatrix Fetters;  Surgeon: Burnell Blanks, MD;  Location: [facility]  CATH LAB;  Service: Cardiovascular;  Laterality: N/A;  . PERCUTANEOUS CORONARY STENT INTERVENTION (PCI-S)  07/28/2012   Procedure: PERCUTANEOUS CORONARY STENT INTERVENTION (PCI-S);  Surgeon: Burnell Blanks, MD;  Location: [facility] CATH LAB;  Service: Cardiovascular;;  . TONSILLECTOMY AND ADENOIDECTOMY  ~ 1951    There were no vitals filed for this visit.      Subjective Assessment - 05/27/16 1024    Subjective "The last few days I had a some soreness in the neck at rated at 4/10"  pt reports she doesn't know when to stop.   Currently in Pain? Yes   Pain Score 4    Pain Location Neck   Pain Orientation Left   Pain Descriptors / Indicators Aching   Pain Onset More than a month ago   Pain Frequency Intermittent   Aggravating Factors  over doing it                         [facility] Adult PT Treatment/Exercise - 05/27/16 1030      Neck Exercises: Seated   Other Seated Exercise thoracic extension over back of chair 2 x 10 pushing from arm rest of chair   Other Seated Exercise thoracic rotation holding physioball 2 x 10   1 x without ball to mimic exercise at home without  ball     Shoulder Exercises: Seated   Other Seated Exercises shoulder depression exercise from table 2 x 10      Shoulder Exercises: Standing   External Rotation AROM;Both   Extension 10 reps   Theraband Level (Shoulder Extension) Level 2 (Red)   Row 10 reps   Theraband Level (Shoulder Row) Level 2 (Red)  cues for proper form                PT Education - 05/27/16 1102    Education provided Yes   Education Details reviewed Hep and how to performed exercises correctly to avoid upper trap activation. updated HEP for thoracic mobility exercises.    Person(s) Educated Patient   Methods Explanation;Verbal cues;Tactile cues;Handout   Comprehension Verbalized understanding;Verbal cues required;Tactile cues required          PT Short Term Goals - 05/18/16 1016      PT SHORT TERM GOAL #1    Title pt will be I with inital HEP (04/22/2016)   Time 3   Period Weeks   Status Achieved     PT SHORT TERM GOAL #2   Title pt will verbalize / demo proper posture and lifting/ carrying mechanics to prevent / reduce muscle tightness and neck pain (04/22/2016)   Baseline able to verbalize correct posture   Time 3   Period Weeks   Status Achieved           PT Long Term Goals - 05/18/16 1017      PT LONG TERM GOAL #1   Title pt will be I with all advanced HEP given (05/13/2016)   Time 6   Period Weeks   Status On-going     PT LONG TERM GOAL #2   Title pt will demonstrate reduce shoulder muscle tightness to reduce pain to </= 1/10 pain and improve cervical mobility (05/13/2016)   Baseline 4/10 pain    Time 6   Period Weeks   Status On-going     PT LONG TERM GOAL #3   Title She will improve cervical flexion/ extension by >/= 10 degrees  and cervical side bending/ rotation by >/= 8 degrees bil to assist with safety during driving and ADLs (09/12/9448)   Baseline flexion 60 degrees, met sidebending/ rotation,    Time 6   Period Weeks   Status Achieved     PT LONG TERM GOAL #4   Title Increaes FOTO score to </=37% limited to demonstrate improvement in function (05/13/2016)   Time 6   Period Weeks   Status On-going     PT LONG TERM GOAL #5   Title pt will be able to push/ pull >/= 15#, and lift / carry >/= 8# at or above her head to assist with functional lifting and carrying with </=2/10 pain and to assist with personal goal of doing yard work (05/13/2016)   Period Weeks   Status On-going               Plan - 05/27/16 1101    Clinical Impression Statement pt reported pain at 4/10 which relieved to 2/10 following thoracic mobility exercises which was given as HEP. reviewed HEP with pt, which she required verbal/ tactile cues for proper form.    PT Next Visit Plan review posture, thoracic exercises for mobility, progressing away from manual and more toward exercise   PT  Home Exercise Plan thoracic extension over chair, and rotation mimicing holding a ball   Consulted and Agree  with Plan of Care Patient      Patient will benefit from skilled therapeutic intervention in order to improve the following deficits and impairments:  Pain, Improper body mechanics, Postural dysfunction, Hypomobility, Decreased range of motion, Decreased activity tolerance, Decreased endurance, Increased fascial restricitons  Visit Diagnosis: Cervicalgia  Abnormal posture  Other muscle spasm     Problem List Patient Active Problem List   Diagnosis Date Noted  . Hypertensive heart disease 05/25/2015  . Type II or unspecified type diabetes mellitus without mention of complication, uncontrolled 02/14/2014  . Hyponatremia 02/13/2014  . HTN (hypertension) 02/13/2014  . Sepsis secondary to UTI ([place]) 02/12/2014  . Acute pyelonephritis 02/12/2014  . Unstable angina ([place]) 07/29/2012  . Obesity (BMI 30-39.9) 07/29/2012  . Bloating 06/15/2012  . Generalized abdominal pain 06/15/2012  . Family history of colon cancer 06/15/2012  . 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 Coronary artery disease 12/30/2010  . Hyperlipidemia 12/30/2010  . Diabetes mellitus ([place]) 12/30/2010  . RECTAL INCONTINENCE 06/13/2008   Starr Lake PT, DPT, LAT, ATC  05/27/16  11:06 AM      [place] [facility] [address] [place], [place], [address] Phone: [phone]   Fax:  [phone]  Name: Ruth [name] MRN: [id number] Date of Birth: 1946/06/21

## 2016-06-03 ENCOUNTER — Ambulatory Visit: Payer: Medicare Other | Admitting: Physical Therapy

## 2016-06-03 DIAGNOSIS — R293 Abnormal posture: Secondary | ICD-10-CM

## 2016-06-03 DIAGNOSIS — M62838 Other muscle spasm: Secondary | ICD-10-CM

## 2016-06-03 DIAGNOSIS — M542 Cervicalgia: Secondary | ICD-10-CM | POA: Diagnosis not present

## 2016-06-03 NOTE — Therapy (Signed)
[place], [place], [address] Phone: [phone]   Fax:  [phone]  Physical Therapy Treatment  Patient Details  Name: Ruth [name] MRN: [id number] Date of Birth: 23-Feb-1946 Referring Provider: Crist Infante MD  Encounter Date: 06/03/2016      PT End of Session - 06/03/16 1140    Visit Number 12   Number of Visits 13   Date for PT Re-Evaluation 06/29/16   PT Start Time 1105   PT Stop Time 1146   PT Time Calculation (min) 41 min   Activity Tolerance Patient tolerated treatment well   Behavior During Therapy [facility] for tasks assessed/performed      Past Medical History:  Diagnosis Date  . Anxiety   . CAD (coronary artery disease)    a. CABG x 4 in 2006 in [place]. b. DES to midbody of SVG-PDA/PLA 07/2012.  Marland Kitchen Complication of anesthesia    "quit breathing when I got ?Inovar" (07/28/2012)  . Depression   . Diverticulosis   . Dyslipidemia    Intolerant of statins  . Fecal incontinence   . GERD (gastroesophageal reflux disease)   . Hyperlipidemia   . Hyperplastic colon polyp   . IBS (irritable bowel syndrome)   . Labile hypertension   . Migraines    "had them in my 30's" (07/28/2012)  . Multiple allergies   . Obesity   . Obesity   . Steatohepatitis   . Type II diabetes mellitus (HCC)    no meds, diet controlled    Past Surgical History:  Procedure Laterality Date  . ABDOMINAL HYSTERECTOMY  1970's  . BREAST LUMPECTOMY     bilateral  . CARDIAC CATHETERIZATION  2006  . CORONARY ANGIOPLASTY WITH STENT PLACEMENT  07/28/2012   "1; first time for me" (07/28/2012)  . CORONARY ARTERY BYPASS GRAFT  2006   CABG X4  . EXCISIONAL HEMORRHOIDECTOMY  1970's  . INGUINAL HERNIA REPAIR  1970's?   left  . LEFT HEART CATHETERIZATION WITH CORONARY/GRAFT ANGIOGRAM N/A 07/28/2012   Procedure: LEFT HEART CATHETERIZATION WITH Beatrix Fetters;  Surgeon: Burnell Blanks, MD;  Location: [facility]  CATH LAB;  Service: Cardiovascular;  Laterality: N/A;  . PERCUTANEOUS CORONARY STENT INTERVENTION (PCI-S)  07/28/2012   Procedure: PERCUTANEOUS CORONARY STENT INTERVENTION (PCI-S);  Surgeon: Burnell Blanks, MD;  Location: [facility] CATH LAB;  Service: Cardiovascular;;  . TONSILLECTOMY AND ADENOIDECTOMY  ~ 1951    There were no vitals filed for this visit.      Subjective Assessment - 06/03/16 1110    Subjective "I am doing, much better today no pain doing exercises more often just not every day"   Currently in Pain? No/denies   Pain Onset More than a month ago   Pain Frequency Intermittent                         [facility] Adult PT Treatment/Exercise - 06/03/16 0001      Neck Exercises: Supine   Neck Retraction 10 reps;5 secs  in semi-recumbent position   Upper Extremity D2 Flexion;Extension;15 reps;Theraband  x 2 sets performed bil   Theraband Level (UE D2) Level 1 (Yellow)   Other Supine Exercise foam roll routine (using rolled up towels) ceiling punches, alt ceiling punchs, horizontal abduction/ adduction, back stroke alternating arms     Neck Exercises: Stretches   Upper Trapezius Stretch 1 rep;30 seconds   Levator Stretch 1 rep;30 seconds  PT Education - 06/03/16 1139    Education Details updated and reviewed HEP with proper form.    Person(s) Educated Patient   Methods Explanation;Tactile cues;Demonstration;Verbal cues;Handout   Comprehension Verbalized understanding;Returned demonstration;Verbal cues required          PT Short Term Goals - 05/18/16 1016      PT SHORT TERM GOAL #1   Title pt will be I with inital HEP (04/22/2016)   Time 3   Period Weeks   Status Achieved     PT SHORT TERM GOAL #2   Title pt will verbalize / demo proper posture and lifting/ carrying mechanics to prevent / reduce muscle tightness and neck pain (04/22/2016)   Baseline able to verbalize correct posture   Time 3   Period Weeks   Status  Achieved           PT Long Term Goals - 05/18/16 1017      PT LONG TERM GOAL #1   Title pt will be I with all advanced HEP given (05/13/2016)   Time 6   Period Weeks   Status On-going     PT LONG TERM GOAL #2   Title pt will demonstrate reduce shoulder muscle tightness to reduce pain to </= 1/10 pain and improve cervical mobility (05/13/2016)   Baseline 4/10 pain    Time 6   Period Weeks   Status On-going     PT LONG TERM GOAL #3   Title She will improve cervical flexion/ extension by >/= 10 degrees  and cervical side bending/ rotation by >/= 8 degrees bil to assist with safety during driving and ADLs (11/06/5496)   Baseline flexion 60 degrees, met sidebending/ rotation,    Time 6   Period Weeks   Status Achieved     PT LONG TERM GOAL #4   Title Increaes FOTO score to </=37% limited to demonstrate improvement in function (05/13/2016)   Time 6   Period Weeks   Status On-going     PT LONG TERM GOAL #5   Title pt will be able to push/ pull >/= 15#, and lift / carry >/= 8# at or above her head to assist with functional lifting and carrying with </=2/10 pain and to assist with personal goal of doing yard work (05/13/2016)   Period Weeks   Status On-going               Plan - 06/03/16 1253    Clinical Impression Statement pt states she has no pain today and has been a little more consistent with her HEP. Continued with thoracic mobility exercises and shoulder exercises which she peformed well with no pain. discussed with pt that next visit is her last session and to bring in all her HEP for review.    PT Next Visit Plan review HEP, ROM, goals. FOTO   PT Home Exercise Plan D2 PNF in supine   Consulted and Agree with Plan of Care Patient      Patient will benefit from skilled therapeutic intervention in order to improve the following deficits and impairments:  Pain, Improper body mechanics, Postural dysfunction, Hypomobility, Decreased range of motion, Decreased activity  tolerance, Decreased endurance, Increased fascial restricitons  Visit Diagnosis: Cervicalgia  Abnormal posture  Other muscle spasm     Problem List Patient Active Problem List   Diagnosis Date Noted  . Hypertensive heart disease 05/25/2015  . Type II or unspecified type diabetes mellitus without mention of complication, uncontrolled 02/14/2014  .  Hyponatremia 02/13/2014  . HTN (hypertension) 02/13/2014  . Sepsis secondary to UTI ([place]) 02/12/2014  . Acute pyelonephritis 02/12/2014  . Unstable angina ([place]) 07/29/2012  . Obesity (BMI 30-39.9) 07/29/2012  . Bloating 06/15/2012  . Generalized abdominal pain 06/15/2012  . Family history of colon cancer 06/15/2012  . 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 Coronary artery disease 12/30/2010  . Hyperlipidemia 12/30/2010  . Diabetes mellitus ([place]) 12/30/2010  . RECTAL INCONTINENCE 06/13/2008   Starr Lake PT, DPT, LAT, ATC  06/03/16  12:57 PM      [facility] [address] [place], [place], [address] Phone: [phone]   Fax:  [phone]  Name: Ruth [name] MRN: [id number] Date of Birth: Dec 06, 1945

## 2016-06-10 ENCOUNTER — Ambulatory Visit: Payer: Medicare Other | Attending: Internal Medicine | Admitting: Physical Therapy

## 2016-06-10 DIAGNOSIS — R293 Abnormal posture: Secondary | ICD-10-CM | POA: Diagnosis present

## 2016-06-10 DIAGNOSIS — M542 Cervicalgia: Secondary | ICD-10-CM

## 2016-06-10 DIAGNOSIS — M62838 Other muscle spasm: Secondary | ICD-10-CM

## 2016-06-10 NOTE — Therapy (Signed)
[place], [place], [address] Phone: [phone]   Fax:  [phone]  Physical Therapy Treatment / Discharge Note  Patient Details  Name: Ruth [name] MRN: [id number] Date of Birth: [birth year] Referring Provider: Crist Infante MD  Encounter Date: 06/10/2016      PT End of Session - 06/10/16 1012    Visit Number 13   Number of Visits 13   Date for PT Re-Evaluation 06/29/16   PT Start Time 1003  shortened visit due to discharge   PT Stop Time 1035   PT Time Calculation (min) 32 min   Activity Tolerance Patient tolerated treatment well   Behavior During Therapy [facility] for tasks assessed/performed      Past Medical History:  Diagnosis Date  . Anxiety   . CAD (coronary artery disease)    a. CABG x 4 in 2006 in [place]. b. DES to midbody of SVG-PDA/PLA 07/2012.  Marland Kitchen Complication of anesthesia    "quit breathing when I got ?Inovar" (07/28/2012)  . Depression   . Diverticulosis   . Dyslipidemia    Intolerant of statins  . Fecal incontinence   . GERD (gastroesophageal reflux disease)   . Hyperlipidemia   . Hyperplastic colon polyp   . IBS (irritable bowel syndrome)   . Labile hypertension   . Migraines    "had them in my 30's" (07/28/2012)  . Multiple allergies   . Obesity   . Obesity   . Steatohepatitis   . Type II diabetes mellitus (HCC)    no meds, diet controlled    Past Surgical History:  Procedure Laterality Date  . ABDOMINAL HYSTERECTOMY  1970's  . BREAST LUMPECTOMY     bilateral  . CARDIAC CATHETERIZATION  2006  . CORONARY ANGIOPLASTY WITH STENT PLACEMENT  07/28/2012   "1; first time for me" (07/28/2012)  . CORONARY ARTERY BYPASS GRAFT  2006   CABG X4  . EXCISIONAL HEMORRHOIDECTOMY  1970's  . INGUINAL HERNIA REPAIR  1970's?   left  . LEFT HEART CATHETERIZATION WITH CORONARY/GRAFT ANGIOGRAM N/A 07/28/2012   Procedure: LEFT HEART CATHETERIZATION WITH Beatrix Fetters;   Surgeon: Burnell Blanks, MD;  Location: [facility] CATH LAB;  Service: Cardiovascular;  Laterality: N/A;  . PERCUTANEOUS CORONARY STENT INTERVENTION (PCI-S)  07/28/2012   Procedure: PERCUTANEOUS CORONARY STENT INTERVENTION (PCI-S);  Surgeon: Burnell Blanks, MD;  Location: [facility] CATH LAB;  Service: Cardiovascular;;  . TONSILLECTOMY AND ADENOIDECTOMY  ~ 1951    There were no vitals filed for this visit.      Subjective Assessment - 06/10/16 1008    Subjective "I am doing so well, I have no pain"    Currently in Pain? No/denies            [facility] PT Assessment - 06/10/16 1009      Observation/Other Assessments   Focus on Therapeutic Outcomes (FOTO)  34% limited     AROM   Cervical Flexion 60   Cervical Extension 48   Cervical - Right Side Bend 58   Cervical - Left Side Bend 42   Cervical - Right Rotation 82   Cervical - Left Rotation 71                     [facility] Adult PT Treatment/Exercise - 06/10/16 0001      Manual Therapy   Manual therapy comments DTM over bil upper traps, levator scapulae, sub-occipitals  with pt in sitting  PT Education - 2016-06-29 1038    Education provided Yes   Education Details reviewed previously given pt's progress with PT, HEP's and how to progress exercises in order to assist with endurance by increasing reps/ sets, reviewed posture handout with focus on specific tasks pertinent to her ADLS/ work.   Person(s) Educated Patient   Methods Explanation;Verbal cues   Comprehension Verbalized understanding;Verbal cues required          PT Short Term Goals - 05/18/16 1016      PT SHORT TERM GOAL #1   Title pt will be I with inital HEP (04/22/2016)   Time 3   Period Weeks   Status Achieved     PT SHORT TERM GOAL #2   Title pt will verbalize / demo proper posture and lifting/ carrying mechanics to prevent / reduce muscle tightness and neck pain (04/22/2016)   Baseline able to verbalize correct posture    Time 3   Period Weeks   Status Achieved           PT Long Term Goals - 29-Jun-2016 1017      PT LONG TERM GOAL #1   Title pt will be I with all advanced HEP given (05/13/2016)   Time 6   Period Weeks   Status Achieved     PT LONG TERM GOAL #2   Title pt will demonstrate reduce shoulder muscle tightness to reduce pain to </= 1/10 pain and improve cervical mobility (05/13/2016)   Time 6   Period Weeks   Status Achieved     PT LONG TERM GOAL #3   Title She will improve cervical flexion/ extension by >/= 10 degrees  and cervical side bending/ rotation by >/= 8 degrees bil to assist with safety during driving and ADLs (04/14/5026)   Period Weeks   Status Achieved     PT LONG TERM GOAL #4   Title Increaes FOTO score to </=37% limited to demonstrate improvement in function (05/13/2016)   Baseline 34% limited   Time 6   Period Weeks   Status Achieved     PT LONG TERM GOAL #5   Title pt will be able to push/ pull >/= 15#, and lift / carry >/= 8# at or above her head to assist with functional lifting and carrying with </=2/10 pain and to assist with personal goal of doing yard work (05/13/2016)   Time 6   Period Weeks   Status Achieved               Plan - 06-29-16 1041    Clinical Impression Statement mrs. Coke has made great progress with PT improveing cervical mobility inall planes, improve endurance with ADLs and additonally reports no pain. she met all goals today. she reports she is able to maintain and progress her current level of function independently and will be discharged from PT today.    PT Next Visit Plan discharged from pt   PT Home Exercise Plan HEP review, posture review   Consulted and Agree with Plan of Care Patient      Patient will benefit from skilled therapeutic intervention in order to improve the following deficits and impairments:  Pain, Improper body mechanics, Postural dysfunction, Hypomobility, Decreased range of motion, Decreased activity  tolerance, Decreased endurance, Increased fascial restricitons  Visit Diagnosis: Cervicalgia  Abnormal posture  Other muscle spasm       G-Codes - 06-29-2016 1043    Functional Assessment Tool Used FOTO/ Clinical judgement  Functional Limitation Carrying, moving and handling objects   Carrying, Moving and Handling Objects Goal Status [phone]) At least 20 percent but less than 40 percent impaired, limited or restricted   Carrying, Moving and Handling Objects Discharge Status [phone]) At least 20 percent but less than 40 percent impaired, limited or restricted      Problem List Patient Active Problem List   Diagnosis Date Noted  . Hypertensive heart disease 05/25/2015  . Type II or unspecified type diabetes mellitus without mention of complication, uncontrolled 02/14/2014  . Hyponatremia 02/13/2014  . HTN (hypertension) 02/13/2014  . Sepsis secondary to UTI ([place]) 02/12/2014  . Acute pyelonephritis 02/12/2014  . Unstable angina ([place]) 07/29/2012  . Obesity (BMI 30-39.9) 07/29/2012  . Bloating 06/15/2012  . Generalized abdominal pain 06/15/2012  . Family history of colon cancer 06/15/2012  . 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 Coronary artery disease 12/30/2010  . Hyperlipidemia 12/30/2010  . Diabetes mellitus ([place]) 12/30/2010  . RECTAL INCONTINENCE 06/13/2008   Starr Lake PT, DPT, LAT, ATC  06/10/16  10:46 AM      [place] [facility] [address] [place], [place], [address] Phone: [phone]   Fax:  [phone]  Name: ETHELL BLATCHFORD MRN: [id number] Date of Birth: [birth year]   PHYSICAL THERAPY DISCHARGE SUMMARY  Visits from Start of Care: 13  Current functional level related to goals / functional outcomes: 34% limited   Remaining deficits: Intermittent tightness / soreness in bil upper traps, increased thoracic kyphosis that she is able to correct with verbal  cues and use of lumbar roll / pelvic tilt exercise.   Education / Equipment: HEP, posture education, theraband for strengthening  Plan: Patient agrees to discharge.  Patient goals were met. Patient is being discharged due to meeting the stated rehab goals.  ?????

## 2016-06-24 DIAGNOSIS — B351 Tinea unguium: Secondary | ICD-10-CM | POA: Insufficient documentation

## 2016-08-19 ENCOUNTER — Ambulatory Visit (INDEPENDENT_AMBULATORY_CARE_PROVIDER_SITE_OTHER): Payer: Medicare Other | Admitting: Cardiovascular Disease

## 2016-08-19 ENCOUNTER — Encounter: Payer: Self-pay | Admitting: Cardiovascular Disease

## 2016-08-19 VITALS — BP 231/107 | HR 70 | Ht 63.5 in | Wt 180.6 lb

## 2016-08-19 DIAGNOSIS — I2510 Atherosclerotic heart disease of native coronary artery without angina pectoris: Secondary | ICD-10-CM | POA: Diagnosis not present

## 2016-08-19 DIAGNOSIS — E785 Hyperlipidemia, unspecified: Secondary | ICD-10-CM

## 2016-08-19 DIAGNOSIS — I119 Hypertensive heart disease without heart failure: Secondary | ICD-10-CM

## 2016-08-19 DIAGNOSIS — I10 Essential (primary) hypertension: Secondary | ICD-10-CM

## 2016-08-19 DIAGNOSIS — I2583 Coronary atherosclerosis due to lipid rich plaque: Secondary | ICD-10-CM | POA: Diagnosis not present

## 2016-08-19 DIAGNOSIS — E669 Obesity, unspecified: Secondary | ICD-10-CM

## 2016-08-19 MED ORDER — VALSARTAN 160 MG PO TABS: ORAL_TABLET | ORAL | 6 refills | Status: DC

## 2016-08-19 NOTE — Patient Instructions (Addendum)
Your physician has requested that you have a renal artery duplex. During this test, an ultrasound is used to evaluate blood flow to the kidneys. Allow one hour for this exam. Do not eat after midnight the day before and avoid carbonated beverages. Take your medications as you usually do.  Your physician recommends that you return for lab work 2-3 weeks after starting new medication.  Your physician has recommended you make the following change in your medication:   1.) STOP losartan. This has been replaced with Valsartan. Take as directed on the bottle.  Your physician recommends that you schedule a follow-up appointment in: 6 weeks with Dr Claiborne Billings.

## 2016-08-21 NOTE — Progress Notes (Signed)
Patient ID: Ruth [name], female   DOB: [birth year], 70 y.o.   MRN: [id number]   Primary M.D.: Dr. Crist Infante  HPI: Ruth [name] is a 70 year old female who presents for 9 month follow-up cardiology evaluation.  Ruth [name] has a history of coronary artery disease.  While living in [place] she developed chest discomfort and in 11-04-2004 underwent CABG surgery 4 at the [facility] in [place] by Dr. Marzetta Merino.  She had a LIMA to LAD, SVG to diagonal, SVG to obtuse marginal, and SVG to the RCA.   Her husband died in November 04, 2010 and she moved to the [place] area.  In 11/05/2011, she experienced symptoms of increasing shortness of breath.  She underwent cardiac catheterization and a stent was placed in the vein graft to her RCA.  She states that she has not had any subsequent stress testing.  She is a former patient of Dr. Einar Gip and an echocardiogram in July 2014 showed normal LV size and function with mild concentric left ventricular hypertrophy.  She had mild to moderate mitral regurgitation, mild tricuspid regurgitation, and mild primary hypertension with an estimated PA pressure 39 mm.  She has a history of hypertension for at least 15-20 years.  She states her blood pressure at home typically is in the 145-150 range, but her blood pressure is always elevated when she goes to the doctor's office.  She has a history of hyperlipidemia and apparently has been intolerant to statins but has never tried Zetia.  There is a history of diabetes mellitus but is not on therapy and states this is diet controlled.    She admits to  increased stress.  Her mother died in 12/03/2014 and recently her dog died.  She does not routinely exercise.  When I initially saw her in June 2016 she has been taking amlodipine 5 mg, losartan 50 mg twice a day and metoprolol 50 mrem twice a day for hypertension and CAD.  She has a history of multiple allergies.  She states that she is Intolerant to amlodipine as well  as Spironolactone.  She has been having labile blood pressure.  In the past. She became dehydrated on diarrhetic therapy.    She underwent an echo Doppler study on 03/13/2015 which showed an ejection fraction at 55-60%.  There was mild mitral regurgitation, mild left atrial dilatation, and very mild pulmonary hypertension with an estimated PA pressure 32 mm.  A nuclear perfusion study done on 03/21/2015 was low risk and demonstrated a very small region of mild ischemia in the basal anterolateral wall.  She had normal LV function with an EF of 65%.  She has a history of diverticular disease.  She denies any episodes of chest pain.  Since I last saw her, she stopped taking diltiazem.  She continues to take losartan 50 mg twice a day, metoprolol 100 mg twice a day for blood pressure control.  She has been taking full dose aspirin.  She denies PND or orthopnea.  She does at times have some difficulty with sleep, but she does not want further evaluation of this.  She presents for reevaluation..  Past Medical History:  Diagnosis Date  . Anxiety   . CAD (coronary artery disease)    a. CABG x 4 in 2004/11/04 in [place]. b. DES to midbody of SVG-PDA/PLA 07/2012.  Marland Kitchen Complication of anesthesia    "quit breathing when I got ?[place]" (07/28/2012)  . Depression   . Diverticulosis   .  Dyslipidemia    Intolerant of statins  . Fecal incontinence   . GERD (gastroesophageal reflux disease)   . Hyperlipidemia   . Hyperplastic colon polyp   . IBS (irritable bowel syndrome)   . Labile hypertension   . Migraines    "had them in my 30's" (07/28/2012)  . Multiple allergies   . Obesity   . Obesity   . Steatohepatitis   . Type II diabetes mellitus (HCC)    no meds, diet controlled    Past Surgical History:  Procedure Laterality Date  . ABDOMINAL HYSTERECTOMY  1970's  . BREAST LUMPECTOMY     bilateral  . CARDIAC CATHETERIZATION  2006  . CORONARY ANGIOPLASTY WITH STENT PLACEMENT  07/28/2012   "1; first  time for me" (07/28/2012)  . CORONARY ARTERY BYPASS GRAFT  2006   CABG X4  . EXCISIONAL HEMORRHOIDECTOMY  1970's  . INGUINAL HERNIA REPAIR  1970's?   left  . LEFT HEART CATHETERIZATION WITH CORONARY/GRAFT ANGIOGRAM N/A 07/28/2012   Procedure: LEFT HEART CATHETERIZATION WITH Beatrix Fetters;  Surgeon: Burnell Blanks, MD;  Location: [facility] CATH LAB;  Service: Cardiovascular;  Laterality: N/A;  . PERCUTANEOUS CORONARY STENT INTERVENTION (PCI-S)  07/28/2012   Procedure: PERCUTANEOUS CORONARY STENT INTERVENTION (PCI-S);  Surgeon: Burnell Blanks, MD;  Location: [facility] CATH LAB;  Service: Cardiovascular;;  . TONSILLECTOMY AND ADENOIDECTOMY  ~ 1951    Allergies  Allergen Reactions  . Betadine [Povidone Iodine] Swelling  . Codeine Anaphylaxis and Other (See Comments)    "quit breathing" (07/28/2012)  . Demerol Other (See Comments)    "quit breathing" (07/28/2012)  . Latex Other (See Comments)    "quit breathing" (07/28/2012)  . Other Other (See Comments)    Perfume, Any Fragrance. "quit breathing" (07/28/2012)  . Percocet [Oxycodone-Acetaminophen] Other (See Comments)    "quit breathing; disoriented" (07/28/2012)  . Plavix [Clopidogrel Bisulfate] Other (See Comments)    "get hot; like I'm burning up inside; had to put me in shower after OHS because of that" (07/28/2012)  . Red Dye Other (See Comments)    "quit breathing" (07/28/2012)  . Shrimp Flavor Shortness Of Breath and Other (See Comments)    "broke out in knots all over" (07/28/2012)  . Shrimp [Shellfish Allergy] Shortness Of Breath and Other (See Comments)    "broke out in knots all over" (07/28/2012)  . Sulfonamide Derivatives Rash and Other (See Comments)    "quit breathing" (07/28/2012)  . Tylenol [Acetaminophen] Shortness Of Breath  . Iohexol     Unknown    Current Outpatient Prescriptions  Medication Sig Dispense Refill  . aspirin 325 MG EC tablet Take 325 mg by mouth daily.    . calcium carbonate  (TUMS - DOSED IN MG ELEMENTAL CALCIUM) 500 MG chewable tablet Chew 1 tablet by mouth 2 (two) times daily as needed. For heartburn    . cholecalciferol (VITAMIN D) 1000 UNITS tablet Take 1,000 Units by mouth daily.    . Cranberry-Vitamin C-Vitamin E (CRANBERRY PLUS VITAMIN C) 4200-20-3 MG-MG-UNIT CAPS Take 1 capsule by mouth daily.    . Magnesium 250 MG TABS Take 0.5 tablets by mouth 2 (two) times daily.    . metoprolol (LOPRESSOR) 100 MG tablet Take 1 tablet (100 mg total) by mouth 2 (two) times daily. 60 tablet 6  . nitroGLYCERIN (NITROSTAT) 0.4 MG SL tablet Place 1 tablet (0.4 mg total) under the tongue every 5 (five) minutes as needed for chest pain (up to 3 doses). 25 tablet 4  . vitamin  B-12 (CYANOCOBALAMIN) 1000 MCG tablet Take 1,000 mcg by mouth daily.    . valsartan (DIOVAN) 160 MG tablet Take 1/2 tablet twice a day for 2 weeks then increase to 1 tablet twice daily 60 tablet 6   No current facility-administered medications for this visit.     Social History   Social History  . Marital status: Widowed    Spouse name: N/A  . Number of children: N/A  . Years of education: N/A   Occupational History  . Not on file.   Social History Main Topics  . Smoking status: Never Smoker  . Smokeless tobacco: Never Used  . Alcohol use No  . Drug use: No  . Sexual activity: No   Other Topics Concern  . Not on file   Social History Narrative  . No narrative on file   Social history is notable that she is widowed.  She has 2 children, ages 3 and 60.  There is no tobacco use.  She does not routinely exercise.  Family History  Problem Relation Age of Onset  . Coronary artery disease Father   . Colon cancer Father   . Colon polyps Father   . Heart disease Father   . Stroke Mother   . Hypertension    . Breast cancer      grandmother  . Diabetes Maternal Grandmother   . Diabetes Paternal Grandmother   . Esophageal cancer Neg Hx   . Rectal cancer Neg Hx   . Stomach cancer Neg Hx      Family history is notable in that her father has heart disease and is 24 years old.  Her mother died at age 66 with a stroke.  She has 2 sisters, ages 43 and 89.  ROS General: Negative; No fevers, chills, or night sweats HEENT: Negative; No changes in vision or hearing, sinus congestion, difficulty swallowing Pulmonary: Negative; No cough, wheezing, shortness of breath, hemoptysis Cardiovascular: See HPI:  GI: Positive for left upper quadrant pain which he describes as "gas." GU: Negative; No dysuria, hematuria, or difficulty voiding Musculoskeletal: Negative; no myalgias, joint pain, or weakness Hematologic: Negative; no easy bruising, bleeding Endocrine: Positive for diabetes mellitus Neuro: Negative; no changes in balance, headaches Skin: Negative; No rashes or skin lesions Psychiatric: Negative; No behavioral problems, depression Sleep: Negative; No snoring,  daytime sleepiness, hypersomnolence, bruxism, restless legs, hypnogognic hallucinations. Other comprehensive 14 point system review is negative   Physical Exam BP (!) 231/107   Pulse 70   Ht 5' 3.5" (1.613 m)   Wt 180 lb 9.6 oz (81.9 kg)   BMI 31.49 kg/m    Repeat blood pressure by me was 168/90  Wt Readings from Last 3 Encounters:  08/19/16 180 lb 9.6 oz (81.9 kg)  11/11/15 195 lb (88.5 kg)  05/23/15 193 lb 3.2 oz (87.6 kg)   General: Alert, oriented, no distress.  Skin: normal turgor, no rashes, warm and dry HEENT: Normocephalic, atraumatic. Pupils equal round and reactive to light; sclera anicteric; extraocular muscles intact, No lid lag; Nose without nasal septal hypertrophy; Mouth/Parynx benign; Mallinpatti scale 3 Neck: No JVD, no carotid bruits; normal carotid upstroke Lungs: clear to ausculatation and percussion bilaterally; no wheezing or rales, normal inspiratory and expiratory effort Chest wall: without tenderness to palpitation Heart: PMI not displaced, RRR, s1 s2 normal, 1-7/6 systolic murmur, No  diastolic murmur, no rubs, gallops, thrills, or heaves Abdomen: soft, nontender; no hepatosplenomehaly, BS+; abdominal aorta nontender and not dilated by palpation. Back: no CVA  tenderness Pulses: 2+  Musculoskeletal: full range of motion, normal strength, no joint deformities Extremities: Pulses 2+, no clubbing cyanosis or edema, Homan's sign negative  Neurologic: grossly nonfocal; Cranial nerves grossly wnl Psychologic: Normal mood and affect  ECG (independently read by me): Normal sinus rhythm at 70 bpm.  Nonspecific ST changes.  No ectopy.  March 2017 ECG (independently read by me):  Normal sinus rhythm at 70 bpm. Nonspecific ST changes laterally.  03/04/2015 ECG (independently read by me): Normal sinus rhythm at 65 bpm.  Mild T wave abnormality in lead 1 and aVL.  LABS:  BMP Latest Ref Rng & Units 03/19/2015 02/15/2014 02/14/2014  Glucose 70 - 99 mg/dL 197(H) 159(H) 178(H)  BUN 6 - 23 mg/dL 13 11 15   Creatinine 0.50 - 1.10 mg/dL 0.59 0.57 0.59  Sodium 135 - 145 mEq/L 139 140 142  Potassium 3.5 - 5.3 mEq/L 4.6 4.6 4.6  Chloride 96 - 112 mEq/L 100 102 103  CO2 19 - 32 mEq/L 23 24 25   Calcium 8.4 - 10.5 mg/dL 9.5 8.7 8.9     Hepatic Function Latest Ref Rng & Units 03/19/2015 02/12/2014 07/07/2012  Total Protein 6.0 - 8.3 g/dL 6.9 8.0 7.6  Albumin 3.5 - 5.2 g/dL 4.1 4.1 4.0  AST 0 - 37 U/L 20 15 23   ALT 0 - 35 U/L 20 20 22   Alk Phosphatase 39 - 117 U/L 64 61 68  Total Bilirubin 0.2 - 1.2 mg/dL 0.7 1.5(H) 0.5  Bilirubin, Direct 0.0 - 0.3 mg/dL - - -    CBC Latest Ref Rng & Units 03/19/2015 02/15/2014 02/14/2014  WBC 4.0 - 10.5 K/uL 6.0 8.2 8.9  Hemoglobin 12.0 - 15.0 g/dL 14.1 12.6 13.0  Hematocrit 36.0 - 46.0 % 42.1 38.4 39.6  Platelets 150 - 400 K/uL 267 210 181   Lab Results  Component Value Date   MCV 91.9 03/19/2015   MCV 93.7 02/15/2014   MCV 94.7 02/14/2014    Lab Results  Component Value Date   TSH 1.987 03/19/2015    BNP No results found for:  BNP  ProBNP No results found for: PROBNP   Lipid Panel     Component Value Date/Time   CHOL 244 (H) 03/19/2015 0812   TRIG 262 (H) 03/19/2015 0812   HDL 29 (L) 03/19/2015 0812   CHOLHDL 8.4 03/19/2015 0812   VLDL 52 (H) 03/19/2015 0812   LDLCALC 163 (H) 03/19/2015 0812   LDLDIRECT 160.9 06/21/2012 0833     RADIOLOGY: No results found.  IMPRESSION:  1. Essential hypertension   2. Coronary artery disease due to lipid rich plaque   3. Obesity (BMI 30-39.9)   4. Hypertensive heart disease without heart failure   5. Hyperlipidemia with target low density lipoprotein (LDL) cholesterol less than 70 mg/dL     ASSESSMENT AND PLAN: Ruth. Ruth [name] is a 70 year old female who is 12 years status post CABG revascularization surgery 4, which was done at the [facility] in [place], [place].  She is status post DES stenting to the vein graft supplying her RCA territory.   Remotely she has experienced vague left-sided chest discomfort which was not exertionally precipitated.  An echo Doppler study showed normal systolic function without wall motion abnormalities.  Nuclear perfusion imaging showed only a minimal region (extent 5%, TPD 4%) of mild ischemia in the basal inferolateral wall.  She continues to have issues with significant blood pressure lability.  Her blood pressure today when she arrived was markedly elevated  at 231/107.  On repeat when tested by me had improved but was still elevated at 168/90.  She is unaware of any episodes of tachycardia.  No longer is taking her previous diltiazem.  I am recommending she discontinue losartan.  In its place.  She will start valsartan 160 mg daily for 2 weeks and then she will increase this to twice a day.  I'm also scheduling her for renal Doppler study to makes certain there is no renal vascular etiology.  Will be monitoring her blood pressures closely.  A complete set of fasting laboratory will be obtained.  I suggested she reduce her  aspirin to 81 mg.  She will need lipid-lowering therapy , and I will contact her regarding the results of her laboratory.  We discussed the importance of weight loss and sodium restriction.  I will see her in the office in 6 weeks for reevaluation.    Time spent: 25 minutes  Troy Sine, MD, [facility]  08/21/2016 6:41 PM

## 2016-09-02 ENCOUNTER — Telehealth: Payer: Self-pay | Admitting: Cardiovascular Disease

## 2016-09-02 NOTE — Telephone Encounter (Signed)
Returned call to patient-patient reports she was suppose to get blood work completed this week or next after a medication change that was made at [facility] on 12/13 by Dr. Claiborne Billings.  Pt reports she did not make medication change as she has "multiple chemical sensitivities" and changes cause her "big problems".   Pt denies taking BP at home because she is too nervous but reports she can tell when it is elevated (face is flushed) and reports she doesn't think it has been elevated, reports she "feels fine".  Pt reports her BP is always high at the doctor office because she is so nervous and when she gets home it has decreased.  Pt wants to let Dr. Claiborne Billings know she did not switch medication from losartan to valsartan.  Patient also wanting to know if blood work is still needed since she did not make this medication change.    Advised patient that I would make Dr. Claiborne Billings aware and route to Dr. Claiborne Billings for review regarding blood work.    Pt agreed and verbalized understanding.

## 2016-09-02 NOTE — Telephone Encounter (Signed)
Pt has a question concerning her labs she is suppose to have. Please call pt on home [phone] or cell [phone]

## 2016-09-08 NOTE — Telephone Encounter (Signed)
Follow up      Pt states that she is still waiting on a call from the nurse from last week.  Please call and ok to leave message on vm if she is not there

## 2016-09-08 NOTE — Telephone Encounter (Signed)
Returned call to patient-made aware Dr. Claiborne Billings is to review and will return call with recommendations.    Pt verbalized understanding.

## 2016-09-11 NOTE — Telephone Encounter (Signed)
Follow up      Calling to follow up.  Pt did not start new medication.  She want to know if she still need to get labs drawn. Also, should she still keep her appt for the renal test on the 19th?  Please call

## 2016-09-11 NOTE — Telephone Encounter (Signed)
Spoke to patient, who declines interest in changing medical therapy for HTN. Advised patient that if she is not making med changes to hold off on bloodwork at this time. She cites "MCS" and white coat syndrome as rationale for not changing meds - thinks she is well controlled at home. She would wish to remain on previous therapy rather than changing ARB type and dosing. Informed her I would make Dr. Claiborne Billings aware.

## 2016-09-12 NOTE — Telephone Encounter (Signed)
I recommend that she change the medicine; she has severe blood pressure lability and needs more potent Rx

## 2016-09-15 ENCOUNTER — Encounter (HOSPITAL_COMMUNITY): Payer: Medicare Other

## 2016-09-15 NOTE — Telephone Encounter (Signed)
Spoke to patient and let her know Dr. Evette Georges recommendations. She voiced acknowledgment of this. She agreed to make changes and follow her BP readings and give [place] a call in a week or so w BP log and updates.

## 2016-09-25 ENCOUNTER — Ambulatory Visit (HOSPITAL_COMMUNITY)
Admission: RE | Admit: 2016-09-25 | Discharge: 2016-09-25 | Disposition: A | Discharge status: Home / Self Care | Payer: Medicare Other | Source: Ambulatory Visit | Attending: Cardiology | Admitting: Cardiology

## 2016-09-25 DIAGNOSIS — I10 Essential (primary) hypertension: Secondary | ICD-10-CM | POA: Insufficient documentation

## 2016-09-28 DIAGNOSIS — K5730 Diverticulosis of large intestine without perforation or abscess without bleeding: Secondary | ICD-10-CM | POA: Insufficient documentation

## 2016-09-28 DIAGNOSIS — R142 Eructation: Secondary | ICD-10-CM | POA: Insufficient documentation

## 2016-10-02 ENCOUNTER — Telehealth: Payer: Self-pay | Admitting: *Deleted

## 2016-10-02 NOTE — Telephone Encounter (Signed)
Left message renal duplex normal. Call if questions and or concerns.

## 2016-11-18 ENCOUNTER — Telehealth: Payer: Self-pay | Admitting: Cardiovascular Disease

## 2016-11-18 NOTE — Telephone Encounter (Signed)
Spoke with patient and she has been having episodes of chest pain over the last several days that only last for a few minutes. Chest pain comes on different times but not brought on by exertion. Patient has been having elevated blood pressure with a reading couple days ago of 230/104. Chest pain not associated with elevated blood pressure Patient stated she is currently taking Losartan 50 mg twice a day and per patient Dr Rulon Abide said ok to take extra as needed. Scheduled ov tomorrow with Allie Bossier NP

## 2016-11-18 NOTE — Telephone Encounter (Signed)
Left message to call back  

## 2016-11-18 NOTE — Telephone Encounter (Signed)
New Message  Requesting call back from nurse  Pt c/o of Chest Pain: STAT if CP now or developed within 24 hours  1. Are you having CP right now? Yes  2. Are you experiencing any other symptoms (ex. SOB, nausea, vomiting, sweating)? Dizziness  3. How long have you been experiencing CP? About 3 days  4. Is your CP continuous or coming and going? Coming & Going  5. Have you taken Nitroglycerin? No ?

## 2016-11-19 ENCOUNTER — Encounter: Payer: Self-pay | Admitting: Nurse Practitioner

## 2016-11-19 ENCOUNTER — Ambulatory Visit (INDEPENDENT_AMBULATORY_CARE_PROVIDER_SITE_OTHER): Payer: Medicare Other | Admitting: Nurse Practitioner

## 2016-11-19 VITALS — BP 199/95 | HR 64 | Ht 62.5 in | Wt 181.0 lb

## 2016-11-19 DIAGNOSIS — I2510 Atherosclerotic heart disease of native coronary artery without angina pectoris: Secondary | ICD-10-CM

## 2016-11-19 DIAGNOSIS — I119 Hypertensive heart disease without heart failure: Secondary | ICD-10-CM | POA: Diagnosis not present

## 2016-11-19 MED ORDER — VALSARTAN 160 MG PO TABS: 160.0000 mg | ORAL_TABLET | Freq: Every day | ORAL | 5 refills | Status: DC

## 2016-11-19 MED ORDER — ASPIRIN EC 81 MG PO TBEC: 81.0000 mg | DELAYED_RELEASE_TABLET | Freq: Every day | ORAL | 5 refills | Status: DC

## 2016-11-19 NOTE — Patient Instructions (Addendum)
Medication Instructions:   Change aspirin from 350m daily to 824mdaily. Stop losartan and start valsartan 16043maily.   Labwork: none   Testing/Procedures: none   Follow-Up: with Kristen in 1 week for blood pressure check. With ChrIgnacia Bayley in 1 month.   Any Other Special Instructions Will Be Listed Below (If Applicable).     If you need a refill on your cardiac medications before your next appointment, please call your pharmacy.

## 2016-11-19 NOTE — Progress Notes (Signed)
Office Visit    Patient Name: Ruth [name] Date of Encounter: 11/19/2016  Primary Care Provider:  Jerlyn Ly, MD Primary Cardiologist:  Corky Downs, MD   Chief Complaint    71 year old female with prior history of CAD status post coronary artery bypass grafting, hypertension, hyperlipidemia, diet-controlled diabetes ongoing hypertension.  Past Medical History    Past Medical History:  Diagnosis Date  . Anxiety   . CAD (coronary artery disease)    a. CABG x 4 in 2006 in [place] (LIMA->LAD, VG->Diag, VG->OM, VG->RCA); b. DES to midbody of SVG-PDA/PLA 07/2012; c. 03/2015 Myoview: EF 65%, small defect of mild severit in basal inferolateral wall, low risk.  . Complication of anesthesia    "quit breathing when I got ?Inovar" (07/28/2012)  . Depression   . Diverticulosis   . Dyslipidemia    Intolerant of statins  . Fecal incontinence   . GERD (gastroesophageal reflux disease)   . Hyperlipidemia   . Hyperplastic colon polyp   . IBS (irritable bowel syndrome)   . Labile hypertension    a. 09/2016 Renal Artery duplex: nl renal arteries.  . Migraines    "had them in my 30's" (07/28/2012)  . Multiple allergies   . Obesity   . Obesity   . Steatohepatitis   . Type II diabetes mellitus (HCC)    a. no meds, diet controlled - takes cinnamon.   Past Surgical History:  Procedure Laterality Date  . ABDOMINAL HYSTERECTOMY  1970's  . BREAST LUMPECTOMY     bilateral  . CARDIAC CATHETERIZATION  2006  . CORONARY ANGIOPLASTY WITH STENT PLACEMENT  07/28/2012   "1; first time for me" (07/28/2012)  . CORONARY ARTERY BYPASS GRAFT  2006   CABG X4  . EXCISIONAL HEMORRHOIDECTOMY  1970's  . INGUINAL HERNIA REPAIR  1970's?   left  . LEFT HEART CATHETERIZATION WITH CORONARY/GRAFT ANGIOGRAM N/A 07/28/2012   Procedure: LEFT HEART CATHETERIZATION WITH Beatrix Fetters;  Surgeon: Burnell Blanks, MD;  Location: [facility] CATH LAB;  Service: Cardiovascular;  Laterality: N/A;  .  PERCUTANEOUS CORONARY STENT INTERVENTION (PCI-S)  07/28/2012   Procedure: PERCUTANEOUS CORONARY STENT INTERVENTION (PCI-S);  Surgeon: Burnell Blanks, MD;  Location: [facility] CATH LAB;  Service: Cardiovascular;;  . TONSILLECTOMY AND ADENOIDECTOMY  ~ 1951    Allergies  Allergies  Allergen Reactions  . Betadine [Povidone Iodine] Swelling  . Codeine Anaphylaxis and Other (See Comments)    "quit breathing" (07/28/2012)  . Demerol Other (See Comments)    "quit breathing" (07/28/2012)  . Latex Other (See Comments)    "quit breathing" (07/28/2012)  . Other Other (See Comments)    Perfume, Any Fragrance. "quit breathing" (07/28/2012)  . Percocet [Oxycodone-Acetaminophen] Other (See Comments)    "quit breathing; disoriented" (07/28/2012)  . Plavix [Clopidogrel Bisulfate] Other (See Comments)    "get hot; like I'm burning up inside; had to put me in shower after OHS because of that" (07/28/2012)  . Red Dye Other (See Comments)    "quit breathing" (07/28/2012)  . Shrimp Flavor Shortness Of Breath and Other (See Comments)    "broke out in knots all over" (07/28/2012)  . Shrimp [Shellfish Allergy] Shortness Of Breath and Other (See Comments)    "broke out in knots all over" (07/28/2012)  . Sulfonamide Derivatives Rash and Other (See Comments)    "quit breathing" (07/28/2012)  . Tylenol [Acetaminophen] Shortness Of Breath  . Iohexol     Unknown    History of Present Illness  71 year old female with the above complex past medical history including coronary artery disease status post coronary artery bypass grafting 4 in 2006 with subsequent stenting of the vein graft to the PDA/PLA in 2013. She had a low risk stress test in 2016. Other history includes hypertension, hyperlipidemia, diet-controlled diabetes, GERD, obesity, and anxiety. She was last seen in clinic in December, at which time her blood pressure was markedly elevated with a systolic over 830. She was advised to discontinue  losartan and begin valsartan. She never ended up doing this. A renal artery duplex was performed in early January and was normal. She says that her blood pressure typically runs in the 140s to 150s at home but over the past few days, it has been higher. Today, she was 199/95 and 200/100 on repeat. She is not symptomatic currently. She says sometimes she can feel her face flushed when her pressure is up. She also occasionally notes fleeting upper abdominal to left chest discomfort, occurring once a week or less, lasting a second or so, and resolving spontaneously. She denies dyspnea, palpitations, PND, orthopnea, dizziness, syncope, edema, or early satiety.  Home Medications    Prior to Admission medications   Medication Sig Start Date End Date Taking? Authorizing Provider  calcium carbonate (TUMS - DOSED IN MG ELEMENTAL CALCIUM) 500 MG chewable tablet Chew 1 tablet by mouth 2 (two) times daily as needed. For heartburn   Yes Historical Provider, MD  cholecalciferol (VITAMIN D) 1000 UNITS tablet Take 1,000 Units by mouth daily.   Yes Historical Provider, MD  Cranberry-Vitamin C-Vitamin E (CRANBERRY PLUS VITAMIN C) 4200-20-3 MG-MG-UNIT CAPS Take 1 capsule by mouth daily.   Yes Historical Provider, MD  Magnesium 250 MG TABS Take 0.5 tablets by mouth 2 (two) times daily.   Yes Historical Provider, MD  metoprolol (LOPRESSOR) 100 MG tablet Take 1 tablet (100 mg total) by mouth 2 (two) times daily. 11/11/15  Yes Troy Sine, MD  nitroGLYCERIN (NITROSTAT) 0.4 MG SL tablet Place 1 tablet (0.4 mg total) under the tongue every 5 (five) minutes as needed for chest pain (up to 3 doses). 07/29/12  Yes Dayna N Dunn, PA-C  vitamin B-12 (CYANOCOBALAMIN) 1000 MCG tablet Take 1,000 mcg by mouth daily.   Yes Historical Provider, MD  aspirin EC 81 MG tablet Take 1 tablet (81 mg total) by mouth daily. 11/19/16   Rogelia Mire, NP  valsartan (DIOVAN) 160 MG tablet Take 1 tablet (160 mg total) by mouth daily. 11/19/16    Rogelia Mire, NP    Review of Systems    She has occasional fleeting upper abdominal and left chest discomfort lasting just a second or so and resolving spontaneously. She has not been having any centralized chest pressure, dyspnea, PND, orthopnea, dizziness, syncope, edema, palpitations, or early satiety. she occasionally notes facial flushing during periods of elevated blood pressure.  All other systems reviewed and are otherwise negative except as noted above.  Physical Exam    VS:  BP (!) 199/95   Pulse 64   Ht 5' 2.5" (1.588 m)   Wt 181 lb (82.1 kg)   BMI 32.58 kg/m  , BMI Body mass index is 32.58 kg/m. GEN: Well nourished, well developed, in no acute distress.  HEENT: normal.  Neck: Supple, no JVD, carotid bruits, or masses. Cardiac: RRR, 2/6 systolic murmur at the bilateral upper sternal borders, no rubs, or gallops. No clubbing, cyanosis, edema.  Radials/DP/PT 2+ and equal bilaterally.  Respiratory:  Respirations regular and  unlabored, clear to auscultation bilaterally. GI: Soft, nontender, nondistended, BS + x 4. MS: no deformity or atrophy. Skin: warm and dry, no rash. Neuro:  Strength and sensation are intact. Psych: Normal affect.  Accessory Clinical Findings    ECG - Regular sinus rhythm, 63, nonspecific T changes. No acute changes.  Assessment & Plan    1.  Hypertensive heart disease without congestive heart failure: Patient's blood pressure is markedly elevated today. This is similar to her presentation in December at which time she was advised to discontinue losartan and begin valsartan 160 mg daily with a plan to titrate that to twice a day. She never ended up changing her ARB as she reports a history of multiple chemical sensitivities and has extreme anxiety about adverse reactions to medications. We discussed how valsartan and losartan carry a similar side effect profile and that since she has tolerated losartan, we would expect that she would tolerate  valsartan. She is now willing to make this change. As her pressure is elevated in clinic today, I did have her take an additional 50 mg of losartan while she was here. She will begin taking valsartan 160 mg daily and I will arrange for follow-up with our pharmacist in one week for additional blood pressure management. If blood pressure remains elevated, we can look to titrate valsartan to 160 mg twice a day as was previously advised by Dr. Claiborne Billings. Beyond that, we could consider switching metoprolol to carvedilol at some point in the future though as noted above, she has a fair amount of anxiety related to changing medicines.  2. Coronary artery disease: Status post prior CABG and subsequent stenting of the vein graft to the PDA/PLA. She low risk stress test in 2016. She has been very active and just mowed her lawn a few days ago without any limitations. She does have occasional fleeting chest pain but this begins as GI gas and works its way up to her left chest and resolves within just a few seconds. Given her good exercise tolerance and ability to work out in her yard, I do not think that she needs any additional ischemic evaluation at this time. She will remain on aspirin, which I have again asked her to reduce to 81 mg left breast seen Dr. Claiborne Billings asked her to do this in December) and beta blocker. She is not on a statin due to intolerance though I do not see it actually listed as a prior intolerance -I will list.   3. Disposition: Follow-up in pharmacy run risk factor management clinic next week for blood pressure evaluation. Follow-up with Dr. Claiborne Billings in April as scheduled.  Murray Hodgkins, NP 11/19/2016, 2:08 PM

## 2016-11-24 NOTE — Addendum Note (Signed)
Addended by: Ulice Brilliant T on: 11/24/2016 02:27 PM   Modules accepted: Orders

## 2016-11-26 ENCOUNTER — Ambulatory Visit (INDEPENDENT_AMBULATORY_CARE_PROVIDER_SITE_OTHER): Payer: Medicare Other | Admitting: Pharmacist

## 2016-11-26 VITALS — BP 163/84 | HR 64

## 2016-11-26 DIAGNOSIS — I10 Essential (primary) hypertension: Secondary | ICD-10-CM | POA: Diagnosis not present

## 2016-11-26 DIAGNOSIS — I119 Hypertensive heart disease without heart failure: Secondary | ICD-10-CM

## 2016-11-26 NOTE — Progress Notes (Signed)
Patient ID: JOLONDA GOMM                 DOB: 12/05/1945                      MRN: [id number]     HPI:  Ruth [name] is a 71 y.o. female patient of Dr Claiborne Billings referred to HTN clinic by Angelica Ran NP. Prior medical history includes CAD s/p bypass grafting, hypertension, hyperlipidemia, obesity and diabetes.  Patient very anxious about medication changes due to sensitivity to various "chemicals".  Losartan was changed to valsartan 160m daily last week with plan titration to 1629mtwice daily is BP not at goal.  Patient presents to clinic for HTN follow up. 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  Patient is taking 8034mwice daily instead of 160m64mery morning. Patient to start taking valsartan 160mg60mry morning in 1 week and to repeat BMET in 2 weeks. Denies dizziness, shortness of breath, headaches, fatigue or any ADR.   Current HTN meds:  Metoprolol 100mg 2me a day Valsartan 160mg d51m  Previously tried:  Amlodipine 5mg - i51mlerant Diltiazem 120mg HCT56m.5mg Losar91m 50mg Nebiv18m 20mg Spirin25mtone 25mg - intol66mt  BP goal: 130/80  Family History: 2 sons ; one son has hypertension and another had CABG recently. Mother died of stroke at at age 76, father ha75 HTN but died at age 53.  Social H71tory: Denies tobacco use, alcohol or illicit drugs.  Diet: Low sodium and low fat diet. Drinks 1 cup of decaf coffee every day  Exercise:  [place]; 160/72 yesterday per patient history  Wt Readings from Last 3 Encounters:  11/19/16 181 lb (82.1 kg)  08/19/16 180 lb 9.6 oz (81.9 kg)  11/11/15 195 lb (88.5 kg)   BP Readings from Last 3 Encounters:  11/26/16 (!) 163/84  11/19/16 (!) 199/95  08/19/16 (!) 231/107   Pulse Readings from Last 3 Encounters:  11/26/16 64  11/19/16 64  08/19/16 70    Past Medical History:  Diagnosis Date  . Anxiety   . CAD (coronary artery disease)    [place] VG->RCA[place] b. DES to midbody of SVG-PDA/PLA 07/2012; c. 03/2015 Myoview: EF 65%, small defect of mild severit in basal inferolateral wall, low risk.  . Complication of anesthesia    "quit breathing when I got ?Inovar" (07/28/2012)  . Depression   . Diverticulosis   . Dyslipidemia    Intolerant of statins  . Fecal incontinence   . GERD (gastroesophageal reflux disease)   . Hyperlipidemia   . Hyperplastic colon polyp   . IBS (irritable bowel syndrome)   . Labile hypertension    a. 09/2016 Renal Artery duplex: nl renal arteries.  . Migraines    "had them in my 30's" (07/28/2012)  . Multiple allergies   . Obesity   . Steatohepatitis   . Type II diabetes mellitus (HCC)    a. no meds, diet controlled - takes cinnamon.    Current Outpatient Prescriptions on File Prior to Visit  Medication Sig Dispense Refill  . aspirin EC 81 MG tablet Take 1 tablet (81 mg total) by mouth daily. 30 tablet 5  . calcium carbonate (TUMS - DOSED IN MG ELEMENTAL CALCIUM) 500 MG chewable tablet Chew 1 tablet by mouth 2 (two) times daily as needed. For heartburn    .  cholecalciferol (VITAMIN D) 1000 UNITS tablet Take 1,000 Units by mouth daily.    . Cranberry-Vitamin C-Vitamin E (CRANBERRY PLUS VITAMIN C) 4200-20-3 MG-MG-UNIT CAPS Take 1 capsule by mouth daily.    . Magnesium 250 MG TABS Take 0.5 tablets by mouth 2 (two) times daily.    . metoprolol (LOPRESSOR) 100 MG tablet Take 1 tablet (100 mg total) by mouth 2 (two) times daily. 60 tablet 6  . nitroGLYCERIN (NITROSTAT) 0.4 MG SL tablet Place 1 tablet (0.4 mg total) under the tongue every 5 (five) minutes as needed for chest pain (up to 3 doses). 25 tablet 4  . valsartan (DIOVAN) 160 MG tablet Take 1 tablet (160 mg total) by mouth daily. 30 tablet 5  . vitamin B-12 (CYANOCOBALAMIN) 1000 MCG tablet Take 1,000 mcg by mouth daily.     No current facility-administered medications on file prior to visit.      Allergies  Allergen Reactions  . Betadine [Povidone Iodine] Swelling  . Codeine Anaphylaxis and Other (See Comments)    "quit breathing" (07/28/2012)  . Demerol Other (See Comments)    "quit breathing" (07/28/2012)  . Latex Other (See Comments)    "quit breathing" (07/28/2012)  . Other Other (See Comments)    Perfume, Any Fragrance. "quit breathing" (07/28/2012)  . Percocet [Oxycodone-Acetaminophen] Other (See Comments)    "quit breathing; disoriented" (07/28/2012)  . Plavix [Clopidogrel Bisulfate] Other (See Comments)    "get hot; like I'm burning up inside; had to put me in shower after OHS because of that" (07/28/2012)  . Red Dye Other (See Comments)    "quit breathing" (07/28/2012)  . Shrimp Flavor Shortness Of Breath and Other (See Comments)    "broke out in knots all over" (07/28/2012)  . Shrimp [Shellfish Allergy] Shortness Of Breath and Other (See Comments)    "broke out in knots all over" (07/28/2012)  . Sulfonamide Derivatives Rash and Other (See Comments)    "quit breathing" (07/28/2012)  . Tylenol [Acetaminophen] Shortness Of Breath  . Iohexol     Unknown  . Statins     Blood pressure (!) 163/84, pulse 64, SpO2 97 %.  Hypertension:  Blood pressure today remains above goal but greatly improved from previous readings.  Also noted patient has history of white coat syndrome. Patient is very apprehensive to all medication changes..  She started taking valsartan less than 7 days ago and  still taking valsartan 167m (1/2 tablet twice daily).  Will continue current medication titration as planned by Dr KClaiborne Billings Patient to take valsartan 1665m(1/2 tablet twice daily) for 1 more week, then 1606mvery morning and repeat BMET in 1 week.  If able to tolerate medication, we will increase valsartan to 160m36mice daily. Will follow up with Dr KellClaiborne Billings3 weeks and with HTN clinic in 6 weeks.   Addalynn Kumari Rodriguez-Guzman PharmD, [id number] 10:00 AM

## 2016-11-26 NOTE — Patient Instructions (Addendum)
Return for a  follow up appointment in in 6 weeks  Your blood pressure today is 163/84 pulse 64  Check your blood pressure at home daily (if able) and keep record of the readings.  Take your BP meds as follows: **All CURRENT medication as prescribed**  Phone [phone]  Bring all of your meds, your BP cuff and your record of home blood pressures to your next appointment.  Exercise as you're able, try to walk approximately 30 minutes per day.  Keep salt intake to a minimum, especially watch canned and prepared boxed foods.  Eat more fresh fruits and vegetables and fewer canned items.  Avoid eating in fast food restaurants.    HOW TO TAKE YOUR BLOOD PRESSURE: . Rest 5 minutes before taking your blood pressure. .  Don't smoke or drink caffeinated beverages for at least 30 minutes before. . Take your blood pressure before (not after) you eat. . Sit comfortably with your back supported and both feet on the floor (don't cross your legs). . Elevate your arm to heart level on a table or a desk. . Use the proper sized cuff. It should fit smoothly and snugly around your bare upper arm. There should be enough room to slip a fingertip under the cuff. The bottom edge of the cuff should be 1 inch above the crease of the elbow. . Ideally, take 3 measurements at one sitting and record the average.

## 2016-12-07 ENCOUNTER — Other Ambulatory Visit: Payer: Self-pay | Admitting: Cardiovascular Disease

## 2016-12-07 NOTE — Telephone Encounter (Signed)
Rx request sent to pharmacy.  

## 2016-12-10 ENCOUNTER — Telehealth: Payer: Self-pay | Admitting: Pharmacist

## 2016-12-10 NOTE — Telephone Encounter (Signed)
Information about initiating ARJUNA extract requested by patient.   LMOM; Arjuna extract may potentiate or mimic beta-blocker action and decrease blood pressure as well as pulse.   Recommendation:  1. Preference is to avoid ARJUNA extract for now and let MD/PCP optimized medication regimen.  2. If preference is to start ARJUNA extract - please start 3-5 days prior to next office visit so we can monitor effect on BP and pulse.

## 2016-12-14 LAB — CBC
HCT: 44.4 % (ref 35.0–45.0)
Hemoglobin: 14.7 g/dL (ref 11.7–15.5)
MCH: 30.6 pg (ref 27.0–33.0)
MCHC: 33.1 g/dL (ref 32.0–36.0)
MCV: 92.5 fL (ref 80.0–100.0)
MPV: 10.2 fL (ref 7.5–12.5)
Platelets: 243 10*3/uL (ref 140–400)
RBC: 4.8 MIL/uL (ref 3.80–5.10)
RDW: 13 % (ref 11.0–15.0)
WBC: 5.8 10*3/uL (ref 3.8–10.8)

## 2016-12-14 LAB — COMPREHENSIVE METABOLIC PANEL
ALT: 12 U/L (ref 6–29)
AST: 12 U/L (ref 10–35)
Albumin: 3.9 g/dL (ref 3.6–5.1)
Alkaline Phosphatase: 63 U/L (ref 33–130)
BILIRUBIN TOTAL: 0.6 mg/dL (ref 0.2–1.2)
BUN: 20 mg/dL (ref 7–25)
CO2: 28 mmol/L (ref 20–31)
Calcium: 9.3 mg/dL (ref 8.6–10.4)
Chloride: 102 mmol/L (ref 98–110)
Creat: 0.63 mg/dL (ref 0.60–0.93)
GLUCOSE: 259 mg/dL — AB (ref 65–99)
POTASSIUM: 4.6 mmol/L (ref 3.5–5.3)
Sodium: 138 mmol/L (ref 135–146)
TOTAL PROTEIN: 6.6 g/dL (ref 6.1–8.1)

## 2016-12-15 ENCOUNTER — Encounter: Payer: Self-pay | Admitting: *Deleted

## 2016-12-17 ENCOUNTER — Encounter: Payer: Self-pay | Admitting: Cardiovascular Disease

## 2016-12-17 ENCOUNTER — Ambulatory Visit (INDEPENDENT_AMBULATORY_CARE_PROVIDER_SITE_OTHER): Payer: Medicare Other | Admitting: Cardiovascular Disease

## 2016-12-17 VITALS — BP 164/98 | HR 67 | Ht 62.0 in | Wt 183.0 lb

## 2016-12-17 DIAGNOSIS — E118 Type 2 diabetes mellitus with unspecified complications: Secondary | ICD-10-CM | POA: Diagnosis not present

## 2016-12-17 DIAGNOSIS — I119 Hypertensive heart disease without heart failure: Secondary | ICD-10-CM

## 2016-12-17 DIAGNOSIS — I2510 Atherosclerotic heart disease of native coronary artery without angina pectoris: Secondary | ICD-10-CM | POA: Diagnosis not present

## 2016-12-17 DIAGNOSIS — E785 Hyperlipidemia, unspecified: Secondary | ICD-10-CM | POA: Diagnosis not present

## 2016-12-17 DIAGNOSIS — I10 Essential (primary) hypertension: Secondary | ICD-10-CM | POA: Diagnosis not present

## 2016-12-17 DIAGNOSIS — E669 Obesity, unspecified: Secondary | ICD-10-CM

## 2016-12-17 MED ORDER — VALSARTAN 160 MG PO TABS: 160.0000 mg | ORAL_TABLET | Freq: Two times a day (BID) | ORAL | 6 refills | Status: DC

## 2016-12-17 NOTE — Progress Notes (Signed)
Patient ID: Ruth [name], female   DOB: March 28, 1946, 71 y.o.   MRN: [id number]   Primary M.D.: Dr. Crist [name]  HPI: [name] is a 71 year old female who presents for a 4 month follow-up cardiology evaluation.  Ms [name] has a history of coronary artery disease.  While living in [place] she developed chest discomfort and in November 04, 2004 underwent CABG surgery 4 at the [facility] in [place] by Dr. Marzetta Merino.  She had a LIMA to LAD, SVG to diagonal, SVG to obtuse marginal, and SVG to the RCA.   Her husband died in 11/04/2010 and she moved to the [place] area.  In 2011/11/05, she experienced symptoms of increasing shortness of breath.  She underwent cardiac catheterization and a stent was placed in the vein graft to her RCA.  She states that she has not had any subsequent stress testing.  She is a former patient of Dr. Einar Gip and an echocardiogram in July 2014 showed normal LV size and function with mild concentric left ventricular hypertrophy.  She had mild to moderate mitral regurgitation, mild tricuspid regurgitation, and mild primary hypertension with an estimated PA pressure 39 mm.  She has a history of hypertension for at least 15-20 years.  She states her blood pressure at home typically is in the 145-150 range, but her blood pressure is always elevated when she goes to the doctor's office.  She has a history of hyperlipidemia and apparently has been intolerant to statins but has never tried Zetia.  There is a history of diabetes mellitus but is not on therapy and states this is diet controlled.    She admits to  increased stress.  Her mother died in 12-03-2014 and recently her dog died.  She does not routinely exercise.  When I initially saw her in June 2016 she has been taking amlodipine 5 mg, losartan 50 mg twice a day and metoprolol 50 mrem twice a day for hypertension and CAD.  She has a history of multiple allergies.  She states that she is Intolerant to amlodipine as  well as Spironolactone.  She has been having labile blood pressure.  In the past. She became dehydrated on diarrhetic therapy.    She underwent an echo Doppler study on 03/13/2015 which showed an ejection fraction at 55-60%.  There was mild mitral regurgitation, mild left atrial dilatation, and very mild pulmonary hypertension with an estimated PA pressure 32 mm.  A nuclear perfusion study done on 03/21/2015 was low risk and demonstrated a very small region of mild ischemia in the basal anterolateral wall.  She had normal LV function with an EF of 65%.  She has a history of diverticular disease.  She denies any episodes of chest pain.  She is diabetic but refuses to take and the type of diabetic medication.  Most recently, she has been taking valsartan 80 mg twice a day, metoprolol 100 mg twice a day for blood pressure.  She takes B12 daily and calcium.  She is on a baby aspirin.  She had blood work on 12/14/2016 and her fasting glucose was 259.  Normal LFTs.  Her BUN and creatinine were 20 and 0.63, respectively.  She was not anemic with a hemoglobin of 14.7 and hematocrit 44.4.  She has an appointment to see Dr. Abner Greenspan in May.  She presents for evaluation.   Past Medical History:  Diagnosis Date  . Anxiety   . CAD (coronary artery disease)    a. CABG x  4 in 2006 in [place], VG->Diag, VG->OM, VG->RCA); b. DES to midbody of SVG-PDA/PLA 07/2012; c. 03/2015 Myoview: EF 65%, small defect of mild severit in basal inferolateral wall, low risk.  . Complication of anesthesia    "quit breathing when I got ?Inovar" (07/28/2012)  . Depression   . Diverticulosis   . Dyslipidemia    Intolerant of statins  . Fecal incontinence   . GERD (gastroesophageal reflux disease)   . Hyperlipidemia   . Hyperplastic colon polyp   . IBS (irritable bowel syndrome)   . Labile hypertension    a. 09/2016 Renal Artery duplex: nl renal arteries.  . Migraines    "had them in my 30's" (07/28/2012)  .  Multiple allergies   . Obesity   . Steatohepatitis   . Type II diabetes mellitus (HCC)    a. no meds, diet controlled - takes cinnamon.    Past Surgical History:  Procedure Laterality Date  . ABDOMINAL HYSTERECTOMY  1970's  . BREAST LUMPECTOMY     bilateral  . CARDIAC CATHETERIZATION  2006  . CORONARY ANGIOPLASTY WITH STENT PLACEMENT  07/28/2012   "1; first time for me" (07/28/2012)  . CORONARY ARTERY BYPASS GRAFT  2006   CABG X4  . EXCISIONAL HEMORRHOIDECTOMY  1970's  . INGUINAL HERNIA REPAIR  1970's?   left  . LEFT HEART CATHETERIZATION WITH CORONARY/GRAFT ANGIOGRAM N/A 07/28/2012   Procedure: LEFT HEART CATHETERIZATION WITH Beatrix Fetters;  Surgeon: Burnell Blanks, MD;  Location: [facility] CATH LAB;  Service: Cardiovascular;  Laterality: N/A;  . PERCUTANEOUS CORONARY STENT INTERVENTION (PCI-S)  07/28/2012   Procedure: PERCUTANEOUS CORONARY STENT INTERVENTION (PCI-S);  Surgeon: Burnell Blanks, MD;  Location: [facility] CATH LAB;  Service: Cardiovascular;;  . TONSILLECTOMY AND ADENOIDECTOMY  ~ 1951    Allergies  Allergen Reactions  . Betadine [Povidone Iodine] Swelling  . Codeine Anaphylaxis and Other (See Comments)    "quit breathing" (07/28/2012)  . Demerol Other (See Comments)    "quit breathing" (07/28/2012)  . Latex Other (See Comments)    "quit breathing" (07/28/2012)  . Other Other (See Comments)    Perfume, Any Fragrance. "quit breathing" (07/28/2012)  . Percocet [Oxycodone-Acetaminophen] Other (See Comments)    "quit breathing; disoriented" (07/28/2012)  . Plavix [Clopidogrel Bisulfate] Other (See Comments)    "get hot; like I'm burning up inside; had to put me in shower after OHS because of that" (07/28/2012)  . Red Dye Other (See Comments)    "quit breathing" (07/28/2012)  . Shrimp Flavor Shortness Of Breath and Other (See Comments)    "broke out in knots all over" (07/28/2012)  . Shrimp [Shellfish Allergy] Shortness Of Breath and Other (See  Comments)    "broke out in knots all over" (07/28/2012)  . Sulfonamide Derivatives Rash and Other (See Comments)    "quit breathing" (07/28/2012)  . Tylenol [Acetaminophen] Shortness Of Breath  . Iohexol     Unknown  . Statins     Current Outpatient Prescriptions  Medication Sig Dispense Refill  . aspirin EC 81 MG tablet Take 1 tablet (81 mg total) by mouth daily. 30 tablet 5  . BIOTIN PO Take 500 mg by mouth daily.    . calcium carbonate (TUMS - DOSED IN MG ELEMENTAL CALCIUM) 500 MG chewable tablet Chew 1 tablet by mouth 2 (two) times daily as needed. For heartburn    . cholecalciferol (VITAMIN D) 1000 UNITS tablet Take 1,000 Units by mouth daily.    . Cranberry-Vitamin C-Vitamin E (CRANBERRY PLUS  VITAMIN C) 4200-20-3 MG-MG-UNIT CAPS Take 1 capsule by mouth daily.    . Magnesium 250 MG TABS Take 0.5 tablets by mouth 2 (two) times daily.    . metoprolol (LOPRESSOR) 100 MG tablet take 1 tablet by mouth twice a day 60 tablet 6  . nitroGLYCERIN (NITROSTAT) 0.4 MG SL tablet Place 1 tablet (0.4 mg total) under the tongue every 5 (five) minutes as needed for chest pain (up to 3 doses). 25 tablet 4  . valsartan (DIOVAN) 160 MG tablet Take 1 tablet (160 mg total) by mouth 2 (two) times daily. 60 tablet 6  . vitamin B-12 (CYANOCOBALAMIN) 1000 MCG tablet Take 1,000 mcg by mouth daily.     No current facility-administered medications for this visit.     Social History   Social History  . Marital status: Widowed    Spouse name: N/A  . Number of children: N/A  . Years of education: N/A   Occupational History  . Not on file.   Social History Main Topics  . Smoking status: Never Smoker  . Smokeless tobacco: Never Used  . Alcohol use No  . Drug use: No  . Sexual activity: No   Other Topics Concern  . Not on file   Social History Narrative  . No narrative on file   Social history is notable that she is widowed.  She has 2 children, ages 91 and 46.  There is no tobacco use.  She does  not routinely exercise. She has a son who had undergone CABG surgery and reportedly has a defibrillator and an ejection fraction of 35%.  Family History  Problem Relation Age of Onset  . Coronary artery disease Father   . Colon cancer Father   . Colon polyps Father   . Heart disease Father   . Stroke Mother   . Hypertension    . Breast cancer      grandmother  . Diabetes Maternal Grandmother   . Diabetes Paternal Grandmother   . Esophageal cancer Neg Hx   . Rectal cancer Neg Hx   . Stomach cancer Neg Hx    Family history is notable in that her father has heart disease and is 27 years old.  Her mother died at age 57 with a stroke.  She has 2 sisters, ages 24 and 39.  ROS General: Negative; No fevers, chills, or night sweats HEENT: Negative; No changes in vision or hearing, sinus congestion, difficulty swallowing Pulmonary: Negative; No cough, wheezing, shortness of breath, hemoptysis Cardiovascular: See HPI:  GI: Positive for left upper quadrant pain which he describes as "gas." GU: Negative; No dysuria, hematuria, or difficulty voiding Musculoskeletal: Negative; no myalgias, joint pain, or weakness Hematologic: Negative; no easy bruising, bleeding Endocrine: Positive for diabetes mellitus , which is untreated.  She has refused medications Neuro: Negative; no changes in balance, headaches Skin: Negative; No rashes or skin lesions Psychiatric: Negative; No behavioral problems, depression Sleep: Negative; No snoring,  daytime sleepiness, hypersomnolence, bruxism, restless legs, hypnogognic hallucinations. Other comprehensive 14 point system review is negative   Physical Exam BP (!) 164/98   Pulse 67   Ht 5' 2"  (1.575 m)   Wt 183 lb (83 kg)   BMI 33.47 kg/m    Repeat blood pressure by me was 160/94  Wt Readings from Last 3 Encounters:  12/17/16 183 lb (83 kg)  11/19/16 181 lb (82.1 kg)  08/19/16 180 lb 9.6 oz (81.9 kg)   General: Alert, oriented, no distress.  Skin:  normal turgor, no rashes, warm and dry HEENT: Normocephalic, atraumatic. Pupils equal round and reactive to light; sclera anicteric; extraocular muscles intact, No lid lag; Nose without nasal septal hypertrophy; Mouth/Parynx benign; Mallinpatti scale 3 Neck: No JVD, no carotid bruits; normal carotid upstroke Lungs: clear to ausculatation and percussion bilaterally; no wheezing or rales, normal inspiratory and expiratory effort Chest wall: without tenderness to palpitation Heart: PMI not displaced, RRR, s1 s2 normal, 2-8/1 systolic murmur, No diastolic murmur, no rubs, gallops, thrills, or heaves Abdomen: soft, nontender; no hepatosplenomehaly, BS+; abdominal aorta nontender and not dilated by palpation. Back: no CVA tenderness Pulses: 2+  Musculoskeletal: full range of motion, normal strength, no joint deformities Extremities: Pulses 2+, no clubbing cyanosis or edema, Homan's sign negative  Neurologic: grossly nonfocal; Cranial nerves grossly wnl Psychologic: Normal mood and affect  ECG (independently read by me): Normal sinus rhythm at 67 bpm.  PR interval 142 ms.  Poor anterior R-wave progression.  Lateral T changes in 1 and L.  December 2017 ECG (independently read by me): Normal sinus rhythm at 70 bpm.  Nonspecific ST changes.  No ectopy.  March 2017 ECG (independently read by me):  Normal sinus rhythm at 70 bpm. Nonspecific ST changes laterally.  03/04/2015 ECG (independently read by me): Normal sinus rhythm at 65 bpm.  Mild T wave abnormality in lead 1 and aVL.  LABS:  BMP Latest Ref Rng & Units 12/14/2016 03/19/2015 02/15/2014  Glucose 65 - 99 mg/dL 259(H) 197(H) 159(H)  BUN 7 - 25 mg/dL 20 13 11   Creatinine 0.60 - 0.93 mg/dL 0.63 0.59 0.57  Sodium 135 - 146 mmol/L 138 139 140  Potassium 3.5 - 5.3 mmol/L 4.6 4.6 4.6  Chloride 98 - 110 mmol/L 102 100 102  CO2 20 - 31 mmol/L 28 23 24   Calcium 8.6 - 10.4 mg/dL 9.3 9.5 8.7     Hepatic Function Latest Ref Rng & Units 12/14/2016  03/19/2015 02/12/2014  Total Protein 6.1 - 8.1 g/dL 6.6 6.9 8.0  Albumin 3.6 - 5.1 g/dL 3.9 4.1 4.1  AST 10 - 35 U/L 12 20 15   ALT 6 - 29 U/L 12 20 20   Alk Phosphatase 33 - 130 U/L 63 64 61  Total Bilirubin 0.2 - 1.2 mg/dL 0.6 0.7 1.5(H)  Bilirubin, Direct 0.0 - 0.3 mg/dL - - -    CBC Latest Ref Rng & Units 12/14/2016 03/19/2015 02/15/2014  WBC 3.8 - 10.8 K/uL 5.8 6.0 8.2  Hemoglobin 11.7 - 15.5 g/dL 14.7 14.1 12.6  Hematocrit 35.0 - 45.0 % 44.4 42.1 38.4  Platelets 140 - 400 K/uL 243 267 210   Lab Results  Component Value Date   MCV 92.5 12/14/2016   MCV 91.9 03/19/2015   MCV 93.7 02/15/2014    Lab Results  Component Value Date   TSH 1.987 03/19/2015    BNP No results found for: BNP  ProBNP No results found for: PROBNP   Lipid Panel     Component Value Date/Time   CHOL 244 (H) 03/19/2015 0812   TRIG 262 (H) 03/19/2015 0812   HDL 29 (L) 03/19/2015 0812   CHOLHDL 8.4 03/19/2015 0812   VLDL 52 (H) 03/19/2015 0812   LDLCALC 163 (H) 03/19/2015 0812   LDLDIRECT 160.9 06/21/2012 0833     RADIOLOGY: No results found.  IMPRESSION:  1. Coronary artery disease involving native coronary artery of native heart without angina pectoris   2. Essential hypertension   3. Hypertensive heart disease without CHF   4.  Obesity (BMI 30-39.9)   5. Type 2 diabetes mellitus with complication, without long-term current use of insulin ([place])   6. Hyperlipidemia with target low density lipoprotein (LDL) cholesterol less than 70 mg/dL     ASSESSMENT AND PLAN: [name] is a 71 year old female who is 12 years status post CABG revascularization surgery 4, which was done at the [facility] in [place], [place] in 2006.Marland Kitchen  She is status post DES stenting to the vein graft supplying her RCA territory.   Remotely she has experienced vague left-sided chest discomfort which was not exertionally precipitated.  An echo Doppler study showed normal systolic function without wall motion  abnormalities.  Nuclear perfusion imaging showed only a minimal region (extent 5%, TPD 4%) of mild ischemia in the basal inferolateral wall.  She continues to have issues with significant blood pressure lability.  She has had significant blood pressure lability in the past and states that she has intolerability to numerous medications.  Today, her blood pressure is elevated but improved from previously.  I have recommended titration of valsartan to 160 mg twice a day from her present 80 twice a day regimen.  Her ventricular rate is in the 60s on her current dose of metoprolol 100 mg twice a day.  I reviewed her most recent laboratory.  She clearly has type 2 diabetes mellitus.  I have suggested reconsideration for initiation of theray.  She will be having a follow-up appointment with Dr. Abner Greenspan next month and this will need to be addressed.  Follow-up laboratory will also be done at that time.  She is not having anginal symptoms.  There are no signs of CHF.  Overtly on physical exam.  Target LDL is less than 70.  She has refused lipid-lowering therapy.  I even discussed with her the possibility of Repatha as an every 2 week injection or monthly  infusion, but she has no interest.  Her BMI is 33.47, which places her in the obesity category.  Weight loss and exercise was recommended.  I will see her in 6 months for cardiology reevaluation.  Time spent: 25 minutes  Troy Sine, MD, [facility]  12/17/2016 1:02 PM

## 2016-12-17 NOTE — Patient Instructions (Signed)
Your physician has recommended you make the following change in your medication:   1.) the valsartan has been changed: take 1 tablet in the morning and 1/2 tablet in the evening for 1 week then increase to 1 tablet twice a day.   Your physician wants you to follow-up in: 6 months or sooner if needed. You will receive a reminder letter in the mail two months in advance. If you don't receive a letter, please call our office to schedule the follow-up appointment.  If you need a refill on your cardiac medications before your next appointment, please call your pharmacy.

## 2017-01-26 DIAGNOSIS — Z6379 Other stressful life events affecting family and household: Secondary | ICD-10-CM | POA: Insufficient documentation

## 2017-02-04 ENCOUNTER — Encounter: Payer: Self-pay | Admitting: Nurse Practitioner

## 2017-02-04 ENCOUNTER — Encounter (INDEPENDENT_AMBULATORY_CARE_PROVIDER_SITE_OTHER): Payer: Self-pay

## 2017-02-04 ENCOUNTER — Ambulatory Visit (INDEPENDENT_AMBULATORY_CARE_PROVIDER_SITE_OTHER): Payer: Medicare Other | Admitting: Nurse Practitioner

## 2017-02-04 VITALS — BP 144/82 | HR 63 | Ht 63.0 in | Wt 178.0 lb

## 2017-02-04 DIAGNOSIS — K5909 Other constipation: Secondary | ICD-10-CM

## 2017-02-04 DIAGNOSIS — K219 Gastro-esophageal reflux disease without esophagitis: Secondary | ICD-10-CM

## 2017-02-04 NOTE — Progress Notes (Signed)
HPI: Patient is a 71 year old female previously followed by Dr. Olevia Perches. She has chronic problems with gas / bloating as well as pelvic floor dysfunction resulting in altered bowel habits. In 2010 she had an anorectal manometry at [facility] which showed weak sphincter tone  Patient has to manually remove stool from her rectum. Other times she is incontinent of stool. Her last colonoscopy was January 2015.  A small sessile tubular adenoma was removed from the cecum. Path negative for HGD.  There was moderate pan-diverticulosis and the colon was narrowed and angulated.  Magenta comes in today for evaluation of excessive belching and terrible burning in her chest over the last week. She has no previous history of GERD. Burning is nonexertional without radiation into chest, back or down arms. . No associated nausea or SOB. She has gotten relief from a combination of Tums, Gas-X and Zantac 75 mg twice a day. She does not eat after 4 PM and sleeps on several pillows. As far as the belching, she does not drink carbonated beverages nor drink through a straw. She has a hx of CAD / remote CABG / remote stent. She is current with her cardiology follow-up, saw Dr. Claiborne Billings a few weeks ago   Sebastiana has been under a lot of stress. Her husband died at Christmas. Son had open heart surgery 4 months ago and apparently arrested during surgery. He did survive but requires a lot of her time / attention. She was in the emergency department with him last night and has yet been to sleep. She recently lost her dog as well   Past Medical History:  Diagnosis Date  . Anxiety   . CAD (coronary artery disease)    a. CABG x 4 in 2006 in [place] (LIMA->LAD, VG->Diag, VG->OM, VG->RCA); b. DES to midbody of SVG-PDA/PLA 07/2012; c. 03/2015 Myoview: EF 65%, small defect of mild severit in basal inferolateral wall, low risk.  . Complication of anesthesia    "quit breathing when I got ?Inovar" (07/28/2012)  . Depression   .  Diverticulosis   . Dyslipidemia    Intolerant of statins  . Fecal incontinence   . GERD (gastroesophageal reflux disease)   . Hyperlipidemia   . Hyperplastic colon polyp   . IBS (irritable bowel syndrome)   . Labile hypertension    a. 09/2016 Renal Artery duplex: nl renal arteries.  . Migraines    "had them in my 30's" (07/28/2012)  . Multiple allergies   . Obesity   . Steatohepatitis   . Type II diabetes mellitus (HCC)    a. no meds, diet controlled - takes cinnamon.    Past Surgical History:  Procedure Laterality Date  . ABDOMINAL HYSTERECTOMY  1970's  . BREAST LUMPECTOMY     bilateral  . CARDIAC CATHETERIZATION  2006  . CORONARY ANGIOPLASTY WITH STENT PLACEMENT  07/28/2012   "1; first time for me" (07/28/2012)  . CORONARY ARTERY BYPASS GRAFT  2006   CABG X4  . EXCISIONAL HEMORRHOIDECTOMY  1970's  . INGUINAL HERNIA REPAIR  1970's?   left  . LEFT HEART CATHETERIZATION WITH CORONARY/GRAFT ANGIOGRAM N/A 07/28/2012   Procedure: LEFT HEART CATHETERIZATION WITH Beatrix Fetters;  Surgeon: Burnell Blanks, MD;  Location: [facility] CATH LAB;  Service: Cardiovascular;  Laterality: N/A;  . PERCUTANEOUS CORONARY STENT INTERVENTION (PCI-S)  07/28/2012   Procedure: PERCUTANEOUS CORONARY STENT INTERVENTION (PCI-S);  Surgeon: Burnell Blanks, MD;  Location: [facility] CATH LAB;  Service: Cardiovascular;;  . TONSILLECTOMY AND  ADENOIDECTOMY  ~ 74   Family History  Problem Relation Age of Onset  . Coronary artery disease Father   . Colon cancer Father   . Colon polyps Father   . Heart disease Father   . Stroke Mother   . Hypertension Unknown   . Breast cancer Unknown        grandmother  . Diabetes Maternal Grandmother   . Diabetes Paternal Grandmother   . Esophageal cancer Neg Hx   . Rectal cancer Neg Hx   . Stomach cancer Neg Hx    Social History  Substance Use Topics  . Smoking status: Never Smoker  . Smokeless tobacco: Never Used  . Alcohol use No   Current  Outpatient Prescriptions  Medication Sig Dispense Refill  . aspirin EC 81 MG tablet Take 1 tablet (81 mg total) by mouth daily. 30 tablet 5  . BIOTIN PO Take 500 mg by mouth daily.    . calcium carbonate (TUMS - DOSED IN MG ELEMENTAL CALCIUM) 500 MG chewable tablet Chew 1 tablet by mouth 2 (two) times daily as needed. For heartburn    . cholecalciferol (VITAMIN D) 1000 UNITS tablet Take 1,000 Units by mouth daily.    . Cranberry-Vitamin C-Vitamin E (CRANBERRY PLUS VITAMIN C) 4200-20-3 MG-MG-UNIT CAPS Take 1 capsule by mouth daily.    . Magnesium 250 MG TABS Take 0.5 tablets by mouth 2 (two) times daily.    . metoprolol (LOPRESSOR) 100 MG tablet take 1 tablet by mouth twice a day 60 tablet 6  . nitroGLYCERIN (NITROSTAT) 0.4 MG SL tablet Place 1 tablet (0.4 mg total) under the tongue every 5 (five) minutes as needed for chest pain (up to 3 doses). 25 tablet 4  . valsartan (DIOVAN) 160 MG tablet Take 1 tablet (160 mg total) by mouth 2 (two) times daily. 60 tablet 6  . vitamin B-12 (CYANOCOBALAMIN) 1000 MCG tablet Take 1,000 mcg by mouth daily.     No current facility-administered medications for this visit.    Allergies  Allergen Reactions  . Betadine [Povidone Iodine] Swelling  . Codeine Anaphylaxis and Other (See Comments)    "quit breathing" (07/28/2012)  . Demerol Other (See Comments)    "quit breathing" (07/28/2012)  . Latex Other (See Comments)    "quit breathing" (07/28/2012)  . Other Other (See Comments)    Perfume, Any Fragrance. "quit breathing" (07/28/2012)  . Percocet [Oxycodone-Acetaminophen] Other (See Comments)    "quit breathing; disoriented" (07/28/2012)  . Plavix [Clopidogrel Bisulfate] Other (See Comments)    "get hot; like I'm burning up inside; had to put me in shower after OHS because of that" (07/28/2012)  . Red Dye Other (See Comments)    "quit breathing" (07/28/2012)  . Shrimp Flavor Shortness Of Breath and Other (See Comments)    "broke out in knots all over"  (07/28/2012)  . Shrimp [Shellfish Allergy] Shortness Of Breath and Other (See Comments)    "broke out in knots all over" (07/28/2012)  . Sulfonamide Derivatives Rash and Other (See Comments)    "quit breathing" (07/28/2012)  . Tylenol [Acetaminophen] Shortness Of Breath  . Iohexol     Unknown  . Statins      Review of Systems: All systems reviewed and negative except where noted in HPI.    Physical Exam: BP (!) 144/82   Pulse 63   Ht 5' 3"  (1.6 m)   Wt 178 lb (80.7 kg)   BMI 31.53 kg/m  Constitutional:  Pleasant, obese white female in  no acute distress. Psychiatric: Normal mood and affect. Behavior is normal. EENT:  Conjunctivae are normal. No scleral icterus. Neck supple.  Cardiovascular: Normal rate, regular rhythm.  Pulmonary/chest: Effort normal and breath sounds normal. No wheezing, rales or rhonchi. Abdominal: Soft, nondistended, nontender. Bowel sounds active throughout. There are no masses palpable. No hepatomegaly. Extremities: no edema Lymphadenopathy: No cervical adenopathy noted. Neurological: Alert and oriented to person place and time. Skin: Skin is warm and dry. No rashes noted.   ASSESSMENT AND PLAN:  67. 71 year old female with a one week history of burning in chest and belching, improved with Tums, Gas-X and Zantac. Suspect she is having acid reflux. -Anti-reflex measures discussed, GERD literature given -Patient has multiple allergies, she is nervous about adding any medications. Since she is tolerating the Zantac will increase dose from 75 mg twice a day to 150 mg twice a day -She can continue Gas-X as needed -follow up with me in 3-4 weeks  2. Pelvic floor dysfunction, evaluated at [facility] in 2010.  Patient manually removes stool, other times she has fecal incontinence.  At time of her colonoscopy in 2015 her perianal area was macerated from digital disimpaction. She continues to have occasional bleeding after manual removal of stool. -Trial of fleets  glycerin suppositories to help facilitate rectal emptying.  3. History of adenomatous colon polyps, surveillance colonoscopy due 2020.  4. Multiple allergies  5. CAD / remote CABG / remote stent. Follows with Dr. Genevie Cheshire, NP  02/04/2017, 10:41 AM

## 2017-02-04 NOTE — Patient Instructions (Addendum)
If you are age 73 or older, your body mass index should be between 23-30. Your Body mass index is 31.53 kg/m. If this is out of the aforementioned range listed, please consider follow up with your Primary Care Provider.  If you are age 43 or younger, your body mass index should be between 19-25. Your Body mass index is 31.53 kg/m. If this is out of the aformentioned range listed, please consider follow up with your Primary Care Provider.   Start Fleets glycerin suppositories and use as needed.  INCREASE Zantac 150 mg to twice daily.  You have been given GERD Literature and Low Gas Diet.  Follow up with Tye Savoy, NP on February 25, 2017 at 10:30 am.  Thank you for choosing me and [facility].   Tye Savoy, NP

## 2017-02-05 ENCOUNTER — Telehealth: Payer: Self-pay | Admitting: Nurse Practitioner

## 2017-02-05 NOTE — Telephone Encounter (Signed)
Patient was seen yesterday. She has increased her Zantac to 150 mg BID as directed. Takes Gas-X and TUMS PRN. Last night it hurt to swallow. She felt like her pills were lodging in her throat. Today she is a little better. Has not had that sensation or level of pain today. She is following reflux precautions and she is careful with her diet. She states her main concern is that if this does not help, will we make her "sit at the ER for hours" to get a scan done. After a lot of conversation I found out the patient has spent most of today "sitting in the ER" with her son waiting for him to be seen. She became concerned if she did not respond to the therapy prescribed to her and she needed further testing, she would have to sit in the ER also.  Reassured the patient. Encouraged her to continue as discussed at her OV. Follow up as already scheduled. It she worsens, call us.

## 2017-02-05 NOTE — Telephone Encounter (Signed)
Left a message on the voicemail returning her call.

## 2017-02-06 NOTE — Progress Notes (Signed)
Reviewed and agree with initial management plan.  Malcolm T. Stark, MD FACG 

## 2017-02-08 ENCOUNTER — Telehealth: Payer: Self-pay | Admitting: Cardiovascular Disease

## 2017-02-08 NOTE — Telephone Encounter (Signed)
Most of her symptoms sound GI, but the pain she has when she is pushing her lawnmower is not. Should come in for a follow-up appointment. May need to discuss repeat cardiac catheterization.

## 2017-02-08 NOTE — Telephone Encounter (Signed)
New Message    Pt c/o of Chest Pain: STAT if CP now or developed within 24 hours  1. Are you having CP right now? Yes   2. Are you experiencing any other symptoms (ex. SOB, nausea, vomiting, sweating)? None 3. How long have you been experiencing CP? 8 days  4. Is your CP continuous or coming and going? continous  5. Have you taken Nitroglycerin?  No   She is having gastro issues, but they told her to follow up with you if the pain did not go away.     Offered pt appt with PA on 02/15/17 and she refused and does not want to go to er ?

## 2017-02-08 NOTE — Telephone Encounter (Signed)
Patient returned call. Offered available appt for today @ 1130am, which patient cannot make. Scheduled for first available w/H. Meng PA on 6/11 @ 930am  Patient wishes to be added to cancellation list in case an opening comes up between now and 6/11  Advised patient on NTG use for anginal/chest pain.

## 2017-02-08 NOTE — Telephone Encounter (Signed)
Attempted to reach patient as at time of the call there was a 1130am appt for 6/4 w/H. Eulas Post, [place] available. Dr. Evette Georges first available is Sept 2018  [facility]

## 2017-02-08 NOTE — Telephone Encounter (Signed)
Returned call to patient  Patient states she has "gas" a lot and she went to see GI on 5/31 - thought her symptoms were reflux related -- was given Zantac BID (taking Tums & Gas-X) -- she has some relief of her symptoms when taking these PRN medications Patient describes "chest pain" and states pain is "severe"  -- occurs when she swallows and when she mows w/push mower She states the pain is there "all the time", it varies in severity She states she "belches like a sailor" She describes having stomach pain - "real bad burning" and then "it just hurts" She states she does not eat at night d/t pain She does not want to go to ED as they make you wait for hours and she has chemical allergies She does not take NTG PRN as it gives her headaches She asked if her symptoms could be related to a "blood clot" around her stent  Advised that if she is getting relief from GI PRN medications, her symptoms are likely related to those issues Nevertheless, she wanted an MD to review her symptoms to tell her if she needed to be seen soon or if she can wait  Sent to DOD (Dr. Claiborne Billings is out of office)

## 2017-02-15 ENCOUNTER — Ambulatory Visit (INDEPENDENT_AMBULATORY_CARE_PROVIDER_SITE_OTHER): Payer: Medicare Other | Admitting: Physician Assistant

## 2017-02-15 ENCOUNTER — Encounter: Payer: Self-pay | Admitting: Physician Assistant

## 2017-02-15 VITALS — BP 160/78 | HR 59 | Ht 63.0 in | Wt 185.0 lb

## 2017-02-15 DIAGNOSIS — E119 Type 2 diabetes mellitus without complications: Secondary | ICD-10-CM

## 2017-02-15 DIAGNOSIS — I10 Essential (primary) hypertension: Secondary | ICD-10-CM | POA: Diagnosis not present

## 2017-02-15 DIAGNOSIS — R1011 Right upper quadrant pain: Secondary | ICD-10-CM

## 2017-02-15 DIAGNOSIS — R0789 Other chest pain: Secondary | ICD-10-CM

## 2017-02-15 DIAGNOSIS — E785 Hyperlipidemia, unspecified: Secondary | ICD-10-CM | POA: Diagnosis not present

## 2017-02-15 DIAGNOSIS — I25708 Atherosclerosis of coronary artery bypass graft(s), unspecified, with other forms of angina pectoris: Secondary | ICD-10-CM

## 2017-02-15 NOTE — Progress Notes (Signed)
Cardiology Office Note    Date:  02/16/2017   ID:  Ruth [name], DOB April 06, 1946, MRN [id number]  PCP:  Crist Infante, MD  Cardiologist:  Dr. Claiborne Billings  Chief Complaint  Patient presents with  . Follow-up    seen for Dr. Claiborne Billings    History of Present Illness:  Ruth [name] is a 71 y.o. female with PMH of CAD s/p CABG x 4 (LIMA to LAD, SVG to diag, SVG to OM, SVG to RCA) 2016 in [place], hyperlipidemia, hypertension and DM 2. He had DES to mid SVG to PDA/PLA in November 2013. Last Myoview obtained in July 2016 showed EF 65%, small defect of mild severity in basal inferolateral wall, overall considered low risk finding. Last echocardiogram obtained on 03/13/2015 showed EF 55-60%, mild MR, PA peak pressure 32 mmHg. She is intolerant to statins but have never tried Zetia. Dr. Claiborne Billings did discuss with her regarding Repatha, however she was not interested. She was last seen by Dr. Claiborne Billings on 12/17/2016, her blood pressure was very labile and elevated on that day. Her valsartan was increased to 160 mg twice a day.  Based on phone conversation on 02/08/2017, she has been having some "gas pain" she did see her GI doctor on 02/04/2017, she was given Zantac twice a day. She does have some relief of her symptom when taking Zantac as needed. Her chest pain however occurs when she swallows and when she pushed a lawnmower. Symptom was concerning therefore she was placed on my schedule to be seen today.  She says her chest pain recently has been very aggravating, she usually have very high tolerance for pain, however some of the recent chest pain has been severe enough to make her stop. She described it as a burning sensation in the mid chest. She states it is somewhat different from her previous angina. Although she does notice it when she pushed a lawnmower. The exertional component of her chest pain is concerning despite some atypical features. We went over 2 alternatives, one is I placed her on Protonix and  also Imdur as a trial of medical therapy. And if she fails medical therapy, then consider stress testing. Alternatively, we can proceed directly to stress testing. She refused to make further adjustment of her medications. She has a long list of drug allergies, with majority of the side effect of medication listed as "stop breathing". She is not interested to try new medications. I will proceed with stress testing to rule out ischemia. She is also concerned her pain is coming from her past gallbladder, I told her gallbladder pain is very unlikely to cause burning sensation in the chest. She says her son is a nutritionist and think that may be a cause. Surprisingly on physical exam, she does have right upper quadrant pain with deep pressure. I will order a right upper quadrant limited ultrasound to further evaluate. Although I told her that her chest pain is unlikely to be related to gallbladder issue.    Past Medical History:  Diagnosis Date  . Anxiety   . CAD (coronary artery disease)    a. CABG x 4 in 2006 in [place] (LIMA->LAD, VG->Diag, VG->OM, VG->RCA); b. DES to midbody of SVG-PDA/PLA 07/2012; c. 03/2015 Myoview: EF 65%, small defect of mild severit in basal inferolateral wall, low risk.  . Complication of anesthesia    "quit breathing when I got ?Inovar" (07/28/2012)  . Depression   . Diverticulosis   . Dyslipidemia  Intolerant of statins  . Fecal incontinence   . GERD (gastroesophageal reflux disease)   . Hyperlipidemia   . Hyperplastic colon polyp   . IBS (irritable bowel syndrome)   . Labile hypertension    a. 09/2016 Renal Artery duplex: nl renal arteries.  . Migraines    "had them in my 30's" (07/28/2012)  . Multiple allergies   . Obesity   . Steatohepatitis   . Type II diabetes mellitus (HCC)    a. no meds, diet controlled - takes cinnamon.    Past Surgical History:  Procedure Laterality Date  . ABDOMINAL HYSTERECTOMY  1970's  . BREAST LUMPECTOMY     bilateral  .  CARDIAC CATHETERIZATION  2006  . CORONARY ANGIOPLASTY WITH STENT PLACEMENT  07/28/2012   "1; first time for me" (07/28/2012)  . CORONARY ARTERY BYPASS GRAFT  2006   CABG X4  . EXCISIONAL HEMORRHOIDECTOMY  1970's  . INGUINAL HERNIA REPAIR  1970's?   left  . LEFT HEART CATHETERIZATION WITH CORONARY/GRAFT ANGIOGRAM N/A 07/28/2012   Procedure: LEFT HEART CATHETERIZATION WITH Beatrix Fetters;  Surgeon: Burnell Blanks, MD;  Location: [facility] CATH LAB;  Service: Cardiovascular;  Laterality: N/A;  . PERCUTANEOUS CORONARY STENT INTERVENTION (PCI-S)  07/28/2012   Procedure: PERCUTANEOUS CORONARY STENT INTERVENTION (PCI-S);  Surgeon: Burnell Blanks, MD;  Location: [facility] CATH LAB;  Service: Cardiovascular;;  . TONSILLECTOMY AND ADENOIDECTOMY  ~ 1951    Current Medications: Outpatient Medications Prior to Visit  Medication Sig Dispense Refill  . aspirin EC 81 MG tablet Take 1 tablet (81 mg total) by mouth daily. 30 tablet 5  . BIOTIN PO Take 500 mg by mouth daily.    . calcium carbonate (TUMS - DOSED IN MG ELEMENTAL CALCIUM) 500 MG chewable tablet Chew 1 tablet by mouth 2 (two) times daily as needed. For heartburn    . cholecalciferol (VITAMIN D) 1000 UNITS tablet Take 1,000 Units by mouth daily.    . Cranberry-Vitamin C-Vitamin E (CRANBERRY PLUS VITAMIN C) 4200-20-3 MG-MG-UNIT CAPS Take 1 capsule by mouth daily.    . Magnesium 250 MG TABS Take 0.5 tablets by mouth 2 (two) times daily.    . metoprolol (LOPRESSOR) 100 MG tablet take 1 tablet by mouth twice a day 60 tablet 6  . nitroGLYCERIN (NITROSTAT) 0.4 MG SL tablet Place 1 tablet (0.4 mg total) under the tongue every 5 (five) minutes as needed for chest pain (up to 3 doses). 25 tablet 4  . valsartan (DIOVAN) 160 MG tablet Take 1 tablet (160 mg total) by mouth 2 (two) times daily. (Patient taking differently: Take 80 mg by mouth 2 (two) times daily. ) 60 tablet 6  . vitamin B-12 (CYANOCOBALAMIN) 1000 MCG tablet Take 1,000 mcg by  mouth daily.     No facility-administered medications prior to visit.      Allergies:   Betadine [povidone iodine]; Codeine; Demerol; Latex; Other; Percocet [oxycodone-acetaminophen]; Plavix [clopidogrel bisulfate]; Red dye; Shrimp flavor; Shrimp [shellfish allergy]; Sulfonamide derivatives; Tylenol [acetaminophen]; Iohexol; and Statins   Social History   Social History  . Marital status: Widowed    Spouse name: N/A  . Number of children: N/A  . Years of education: N/A   Social History Main Topics  . Smoking status: Never Smoker  . Smokeless tobacco: Never Used  . Alcohol use No  . Drug use: No  . Sexual activity: No   Other Topics Concern  . None   Social History Narrative  . None  Family History:  The patient's family history includes Colon cancer in her father; Colon polyps in her father; Coronary artery disease in her father; Diabetes in her maternal grandmother and paternal grandmother; Heart disease in her father; Stroke in her mother.   ROS:   Please see the history of present illness.    ROS All other systems reviewed and are negative.   PHYSICAL EXAM:   VS:  BP (!) 160/78   Pulse (!) 59   Ht 5' 3"  (1.6 m)   Wt 185 lb (83.9 kg)   BMI 32.77 kg/m    GEN: Well nourished, well developed, in no acute distress  HEENT: normal  Neck: no JVD, carotid bruits, or masses Cardiac: RRR; no murmurs, rubs, or gallops,no edema  Respiratory:  clear to auscultation bilaterally, normal work of breathing GI: soft, nontender, nondistended, + BS MS: no deformity or atrophy  Skin: warm and dry, no rash Neuro:  Alert and Oriented x 3, Strength and sensation are intact Psych: euthymic mood, full affect  Wt Readings from Last 3 Encounters:  02/15/17 185 lb (83.9 kg)  02/04/17 178 lb (80.7 kg)  12/17/16 183 lb (83 kg)      Studies/Labs Reviewed:   EKG:  EKG is ordered today.  The ekg ordered today demonstrates normal sinus rhythm nonspecific ST-T wave changes.  Recent  Labs: 12/14/2016: ALT 12; BUN 20; Creat 0.63; Hemoglobin 14.7; Platelets 243; Potassium 4.6; Sodium 138   Lipid Panel    Component Value Date/Time   CHOL 244 (H) 03/19/2015 0812   TRIG 262 (H) 03/19/2015 0812   HDL 29 (L) 03/19/2015 0812   CHOLHDL 8.4 03/19/2015 0812   VLDL 52 (H) 03/19/2015 0812   LDLCALC 163 (H) 03/19/2015 0812   LDLDIRECT 160.9 06/21/2012 0833    Additional studies/ records that were reviewed today include:   Cath 07/28/2012 Angiographic Findings:  Left main: 10% distal stenosis.   Left Anterior Descending Artery: Large caliber vessel that courses to the apex. The proximal vessel has a 60% stenosis. The mid vessel has a 100% sub-total occlusion. The first two diagonal branches arise before the total occlusion and have non-obstructive plaque disease. The third diagonal branch, mid LAD and distal LAD fills from the LIMA graft.   Circumflex Artery: Moderate caliber vessel 100% mid occlusion. The second OM and distal AV groove Circumflex fill from the vein graft.   Right Coronary Artery: Large caliber, dominant vessel with 30% proximal stenosis, diffuse 50% mid vessel stenosis and 99% sub-total occlusion of the distal vessel just before the bifurcation into the PDA and PLA. The PDA and PLA fill from the patent vein graft.   Graft Anatomy:  SVG to PDA/PLA patent with 99% stenosis in the mid body of the vein graft SVG to OM is patent LIMA to mid LAD and diagonal is patent  Left Ventricular Angiogram: LVEF=65%  Impression: 1. Triple vessel CAD s/p 4V CABG with 4/4 patent grafts with severe stenosis mid body of SVG to PDA/PLA. 2. Unstable angina 3. Successful PTCA/DES x 1 mid body of SVG to PDA/PLA 4. Preserved LV systolic function  Recommendations: She will need ASA and Effient for one year. Continue other cardiac meds.        Complications:  None. The patient tolerated the procedure well.                   Echo 03/13/2015 LV EF: 55% -   60%  Study  Conclusions  - Left ventricle: The  cavity size was normal. Wall thickness was   normal. Systolic function was normal. The estimated ejection   fraction was in the range of 55% to 60%. - Mitral valve: There was mild regurgitation. - Left atrium: The atrium was mildly dilated. - Atrial septum: No defect or patent foramen ovale was identified. - Pulmonary arteries: PA peak pressure: 32 mm Hg (S).   Myoview 03/21/2015 Study Highlights    Nuclear stress EF: 65%.  The left ventricular ejection fraction is normal (55-65%).  There was no ST segment deviation noted during stress.  Defect 1: There is a small defect of mild severity present in the basal inferolateral location.  This is a low risk study.  Findings consistent with ischemia.   Low risk nuclear perfusion study demonstrating a small region (extent 5%; TPD 4%) of mild ischemia in the basal inferolateral wall. Mild baseline T wave abnormality in V2 and aVL, unchanged with pharmacologic stress. Normal LV function with EF 65% without wall motion abnormality.    ASSESSMENT:    1. Burning chest pain   2. Right upper quadrant pain   3. Coronary artery disease involving coronary bypass graft of native heart with other forms of angina pectoris ([place])   4. Essential hypertension   5. Hyperlipidemia, unspecified hyperlipidemia type   6. Controlled type 2 diabetes mellitus without complication, without long-term current use of insulin (HCC)      PLAN:  In order of problems listed above:  1. Burning chest pain: Symptoms somewhat atypical compared to her usual angina. However, it does have some typical features including chest pain when she pushed on lawnmower. It does not occur consistently with exertion though. We discussed alternative possibilities including a trial of medical therapy including Protonix and Imdur to cover both the possibility of GERD and angina vs proceeding directly for stress testing which is also reasonable. She  does not wish to try new medication at this time, citing multiple allergies with anaphylaxis in the past. We will proceed with stress testing  2. Right upper quadrant pain: Her son is a nutritionist to think some of her burning sensation in the chest could be the result of gallbladder issues. I said it is very unlikely gallbladder issue present illness leading sensation in the chest. Surprisingly on physical exam she does have right upper quadrant tenderness with palpation. I will order a right upper quadrant ultrasound to further assess.  3. CAD s/p CABG: She did describe the previous chest pain as a chest tightness, worse to current chest pain is more burning sensation. Given the exertional nature of some of her recent chest burning sensation, we opted with outpatient stress testing.  4. Hypertension: Blood pressure is elevated today, I recommended addition of some new medication to control blood pressure. However she sent to be very hesitant to try any new medications.  5. Hyperlipidemia: She has refused Repatha in the past. Last lipid panel was in 2016. She does not want to try any new medications.  6. DM 2: Does not appears to be on any diabetes medication. We'll defer to primary care physician.    Medication Adjustments/Labs and Tests Ordered: Current medicines are reviewed at length with the patient today.  Concerns regarding medicines are outlined above.  Medication changes, Labs and Tests ordered today are listed in the Patient Instructions below. Patient Instructions  Your physician has requested that you have an exercise stress myoview. For further information please visit [email or web address]. Please follow instruction sheet, as given.  -- on  day of stress test, take sips of water w/valsartan prior to stress test  -- HOLD metoprolol the evening before test  -- try to keep stomach as empty as possible  -- no caffeinated beverages night before/morning of test  Isaac Laud, [place] has ordered a  right upper quadrant ultrasound - [address]  Your physician recommends that you schedule a follow-up appointment in: TWO MONTHS with Dr. Claiborne Billings      Signed, Almyra Deforest, [place]  02/16/2017 10:20 AM    [facility], [place], [place]  [address] Phone: [phone]; Fax: [phone]

## 2017-02-15 NOTE — Patient Instructions (Addendum)
Your physician has requested that you have an exercise stress myoview. For further information please visit [email or web address]. Please follow instruction sheet, as given.  -- on day of stress test, take sips of water w/valsartan prior to stress test  -- HOLD metoprolol the evening before test  -- try to keep stomach as empty as possible  -- no caffeinated beverages night before/morning of test  Isaac Laud, [place] has ordered a right upper quadrant ultrasound - [address]  Your physician recommends that you schedule a follow-up appointment in: TWO MONTHS with Dr. Claiborne Billings

## 2017-02-16 ENCOUNTER — Encounter: Payer: Self-pay | Admitting: Physician Assistant

## 2017-02-18 ENCOUNTER — Telehealth: Payer: Self-pay | Admitting: Physician Assistant

## 2017-02-18 NOTE — Telephone Encounter (Signed)
I left a message for the patient stating there was no appointment here in our office tomorrow but on 02/24/17. I did let her know she had an ultrasound appointment tomorrow with GI office and advised her to call them for further instruction

## 2017-02-18 NOTE — Telephone Encounter (Signed)
Pt wants to know where is she supposed to have her test tomorrow?

## 2017-02-19 ENCOUNTER — Ambulatory Visit
Admission: RE | Admit: 2017-02-19 | Discharge: 2017-02-19 | Disposition: A | Discharge status: Home / Self Care | Payer: Medicare Other | Source: Ambulatory Visit | Attending: Physician Assistant | Admitting: Physician Assistant

## 2017-02-19 ENCOUNTER — Telehealth (HOSPITAL_COMMUNITY): Payer: Self-pay

## 2017-02-19 DIAGNOSIS — R1011 Right upper quadrant pain: Secondary | ICD-10-CM

## 2017-02-19 NOTE — Telephone Encounter (Signed)
Encounter complete. 

## 2017-02-19 NOTE — Progress Notes (Signed)
Forward result to primary care provider. Did have distended gallbladder, also 3 mm polyp which was found incidentally, but no obvious wall thickening that is usually accompanied with cholecystitis

## 2017-02-22 ENCOUNTER — Telehealth: Payer: Self-pay | Admitting: Physician Assistant

## 2017-02-22 NOTE — Telephone Encounter (Signed)
Thank you :)

## 2017-02-22 NOTE — Telephone Encounter (Signed)
New message    Pt is calling asking if results from her gallbladder test were received. She also has a question about which medication she should and shouldn't take before test. Please call. She said she needs a call soon, because she has to take her medication.

## 2017-02-22 NOTE — Telephone Encounter (Signed)
Spoke with pt she states that she has a Myoview scheduled 02-24-17 informed pt not to take her metoprolol 24 hours before testing, nothing to eat or drink 2 hours prior,, no caffeine 12 hours prior.  Pt wants result of "gall bladder test" she states that she does not want to have stress test done if chestpain/discomfort is because of her gallbladder.(Notes recorded by Almyra Deforest, PA on 02/19/2017 at 1:38 PM EDT, Forward result to primary care provider. Did have distended gallbladder, also 3 mm polyp which was found incidentally, but no obvious wall thickening that is usually accompanied with cholecystitis). Pt notified of results above, she states that she would like to cancel, I informed pt of Hao's recommedation(per  Almyra Deforest note: I said it is very unlikely gallbladder issue present illness leading sensation in the chest)and  continuing with stress testing because of this. She states that she will continue and have Myoview.  She will contact her PCP or GI doctor to see if this pain is related to her gallbladder. She states that her daddy died fro bladder CA and is very concerned about her gallbladder issues. Pt states that she will continue with exercise Myoview as ordered 02-24-17.

## 2017-02-24 ENCOUNTER — Encounter: Payer: Self-pay | Admitting: Physician Assistant

## 2017-02-24 ENCOUNTER — Ambulatory Visit (HOSPITAL_COMMUNITY)
Admission: RE | Admit: 2017-02-24 | Discharge: 2017-02-24 | Disposition: A | Discharge status: Home / Self Care | Payer: Medicare Other | Source: Ambulatory Visit | Attending: Cardiovascular Disease | Admitting: Cardiovascular Disease

## 2017-02-24 ENCOUNTER — Encounter (HOSPITAL_COMMUNITY): Payer: Self-pay | Admitting: *Deleted

## 2017-02-24 ENCOUNTER — Ambulatory Visit (INDEPENDENT_AMBULATORY_CARE_PROVIDER_SITE_OTHER): Payer: Medicare Other | Admitting: Physician Assistant

## 2017-02-24 VITALS — BP 220/92 | HR 69 | Ht 63.5 in | Wt 175.0 lb

## 2017-02-24 DIAGNOSIS — E119 Type 2 diabetes mellitus without complications: Secondary | ICD-10-CM | POA: Diagnosis not present

## 2017-02-24 DIAGNOSIS — I10 Essential (primary) hypertension: Secondary | ICD-10-CM

## 2017-02-24 DIAGNOSIS — R0789 Other chest pain: Secondary | ICD-10-CM | POA: Diagnosis present

## 2017-02-24 DIAGNOSIS — R9439 Abnormal result of other cardiovascular function study: Secondary | ICD-10-CM | POA: Insufficient documentation

## 2017-02-24 DIAGNOSIS — I25700 Atherosclerosis of coronary artery bypass graft(s), unspecified, with unstable angina pectoris: Secondary | ICD-10-CM

## 2017-02-24 DIAGNOSIS — E785 Hyperlipidemia, unspecified: Secondary | ICD-10-CM

## 2017-02-24 LAB — MYOCARDIAL PERFUSION IMAGING
CHL CUP NUCLEAR SRS: 4
CHL CUP NUCLEAR SSS: 14
LV dias vol: 86 mL (ref 46–106)
LV sys vol: 39 mL
NUC STRESS TID: 1.2
Peak HR: 118 {beats}/min
Rest HR: 76 {beats}/min
SDS: 10

## 2017-02-24 MED ORDER — REGADENOSON 0.4 MG/5ML IV SOLN
0.4000 mg | Freq: Once | INTRAVENOUS | Status: AC
  Administered 2017-02-24: 0.4 mg via INTRAVENOUS

## 2017-02-24 MED ORDER — TECHNETIUM TC 99M TETROFOSMIN IV KIT
10.5000 | PACK | Freq: Once | INTRAVENOUS | Status: AC | PRN
  Administered 2017-02-24: 10.5 via INTRAVENOUS
  Filled 2017-02-24: qty 11

## 2017-02-24 MED ORDER — TECHNETIUM TC 99M TETROFOSMIN IV KIT
31.2000 | PACK | Freq: Once | INTRAVENOUS | Status: AC | PRN
  Administered 2017-02-24: 31.2 via INTRAVENOUS
  Filled 2017-02-24: qty 32

## 2017-02-24 MED ORDER — AMINOPHYLLINE 25 MG/ML IV SOLN
75.0000 mg | Freq: Once | INTRAVENOUS | Status: AC
  Administered 2017-02-24: 75 mg via INTRAVENOUS

## 2017-02-24 NOTE — Patient Instructions (Addendum)
Medication Instructions: No changes   Follow-Up: We will call you with an update and instructions for the catheterization after speaking with Dr.[name].    Any Additional Special Instructions Will Be Listed Below (If Applicable).  If you experience any chest pain, please seek immediate medical attention or call 911.

## 2017-02-24 NOTE — Progress Notes (Signed)
Cardiology Office Note    Date:  02/25/2017   ID:  Ruth, [name] 06-03-1946, MRN [id number]  PCP:  Crist Infante, MD  Cardiologist:  Dr. Claiborne Billings   Chief Complaint  Patient presents with  . Follow-up    add- on for Dr. Claiborne Billings, abnormal stress test    History of Present Illness:  Ruth [name] is a 71 y.o. female with PMH of CAD s/p CABG x 4 (LIMA to LAD, SVG to diag, SVG to OM, SVG to RCA) 2016 in [place], hyperlipidemia, hypertension and DM 2. She had DES to mid SVG to PDA/PLA in November 2013. Last Myoview obtained in July 2016 showed EF 65%, small defect of mild severity in basal inferolateral wall, overall considered low risk finding. Last echocardiogram obtained on 03/13/2015 showed EF 55-60%, mild MR, PA peak pressure 32 mmHg. She is intolerant to statins but have never tried Zetia. Dr. Claiborne Billings did discuss with her regarding Repatha, however she was not interested. She was last seen by Dr. Claiborne Billings on 12/17/2016, her blood pressure was very labile and elevated on that day. Her valsartan was increased to 160 mg twice a day.  She is an add-on to my schedule today. I last saw the patient on 02/15/2017 at which time she was complaining of some gas pain. Her symptom occurs when she swallows, however also occurs when she pushed a lawnmower. The exertional nature of the symptom was quite concerning. I recommended a stress test, she underwent a stress test today, she had hypertensive response, however Myoview portion was significantly abnormal with medium defect of moderate severity present in the basal inferior, basal inferolateral, mid inferior, apical inferior, apical lateral location consistent with his inferolateral ischemia. Ejection fraction was calculated to be 55%. I discussed various options with the patient, she was still having chest discomfort. I have reviewed the images with DOD Dr. Oval Linsey. I discussed the benefit and risk of the cardiac catheterization with the patient. She  wished to hear from Dr. Claiborne Billings before deciding on the procedure, I will discuss the case with Dr. Claiborne Billings. I recommended at addition of Imdur, however she does not wish to try any new medication at this time. Her blood pressure is elevated today, she has not been taking 160 mg twice a day of losartan like Dr. Claiborne Billings recommended, instead she is taking 80 mg twice a day, I asked her to increase the medication to 160 mg twice a day.   Past Medical History:  Diagnosis Date  . Anxiety   . CAD (coronary artery disease)    a. CABG x 4 in 2006 in [place] (LIMA->LAD, VG->Diag, VG->OM, VG->RCA); b. DES to midbody of SVG-PDA/PLA 07/2012; c. 03/2015 Myoview: EF 65%, small defect of mild severit in basal inferolateral wall, low risk.  . Complication of anesthesia    "quit breathing when I got ?Inovar" (07/28/2012)  . Depression   . Diverticulosis   . Dyslipidemia    Intolerant of statins  . Fecal incontinence   . GERD (gastroesophageal reflux disease)   . Hyperlipidemia   . Hyperplastic colon polyp   . IBS (irritable bowel syndrome)   . Labile hypertension    a. 09/2016 Renal Artery duplex: nl renal arteries.  . Migraines    "had them in my 30's" (07/28/2012)  . Multiple allergies   . Obesity   . Steatohepatitis   . Type II diabetes mellitus (HCC)    a. no meds, diet controlled - takes cinnamon.  Past Surgical History:  Procedure Laterality Date  . ABDOMINAL HYSTERECTOMY  1970's  . BREAST LUMPECTOMY     bilateral  . CARDIAC CATHETERIZATION  2006  . CORONARY ANGIOPLASTY WITH STENT PLACEMENT  07/28/2012   "1; first time for me" (07/28/2012)  . CORONARY ARTERY BYPASS GRAFT  2006   CABG X4  . EXCISIONAL HEMORRHOIDECTOMY  1970's  . INGUINAL HERNIA REPAIR  1970's?   left  . LEFT HEART CATHETERIZATION WITH CORONARY/GRAFT ANGIOGRAM N/A 07/28/2012   Procedure: LEFT HEART CATHETERIZATION WITH Beatrix Fetters;  Surgeon: Burnell Blanks, MD;  [facility];  Service:  Cardiovascular;  Laterality: N/A;  . PERCUTANEOUS CORONARY STENT INTERVENTION (PCI-S)  07/28/2012   Procedure: PERCUTANEOUS CORONARY STENT INTERVENTION (PCI-S);  Surgeon: Burnell Blanks, MD;  Location: [facility] CATH LAB;  Service: Cardiovascular;;  . TONSILLECTOMY AND ADENOIDECTOMY  ~ 1951    Current Medications: Outpatient Medications Prior to Visit  Medication Sig Dispense Refill  . aspirin EC 81 MG tablet Take 1 tablet (81 mg total) by mouth daily. 30 tablet 5  . calcium carbonate (TUMS - DOSED IN MG ELEMENTAL CALCIUM) 500 MG chewable tablet Chew 1-2 tablets by mouth 3 (three) times daily as needed for heartburn.     . cholecalciferol (VITAMIN D) 1000 UNITS tablet Take 1,000 Units by mouth daily.    . Cranberry-Vitamin C-Vitamin E (CRANBERRY PLUS VITAMIN C) 4200-20-3 MG-MG-UNIT CAPS Take 1 capsule by mouth daily.    . Magnesium 250 MG TABS Take 250 mg by mouth daily.     . metoprolol (LOPRESSOR) 100 MG tablet take 1 tablet by mouth twice a day 60 tablet 6  . nitroGLYCERIN (NITROSTAT) 0.4 MG SL tablet Place 1 tablet (0.4 mg total) under the tongue every 5 (five) minutes as needed for chest pain (up to 3 doses). 25 tablet 4  . vitamin B-12 (CYANOCOBALAMIN) 1000 MCG tablet Take 1,000 mcg by mouth daily.    Marland Kitchen BIOTIN PO Take 500 mg by mouth daily.    . valsartan (DIOVAN) 160 MG tablet Take 1 tablet (160 mg total) by mouth 2 (two) times daily. (Patient taking differently: Take 80 mg by mouth 2 (two) times daily. ) 60 tablet 6   No facility-administered medications prior to visit.      Allergies:   Betadine [povidone iodine]; Codeine; Demerol; Latex; Other; Percocet [oxycodone-acetaminophen]; Plavix [clopidogrel bisulfate]; Red dye; Shellfish allergy; Sulfonamide derivatives; Tylenol [acetaminophen]; Iohexol; and Statins   Social History   Social History  . Marital status: Widowed    Spouse name: N/A  . Number of children: 2  . Years of education: N/A   Occupational History  . retired     Social History Main Topics  . Smoking status: Never Smoker  . Smokeless tobacco: Never Used  . Alcohol use No  . Drug use: No  . Sexual activity: No   Other Topics Concern  . None   Social History Narrative  . None     Family History:  The patient's family history includes Colon cancer in her father; Colon polyps in her father; Coronary artery disease in her father; Diabetes in her maternal grandmother and paternal grandmother; Heart disease in her father; Stroke in her mother.   ROS:   Please see the history of present illness.    ROS All other systems reviewed and are negative.   PHYSICAL EXAM:   VS:  BP (!) 220/92 (BP Location: Left Arm)   Pulse 69   Ht 5' 3.5" (1.613  m)   Wt 175 lb (79.4 kg)   SpO2 97%   BMI 30.51 kg/m    GEN: Well nourished, well developed, in no acute distress  HEENT: normal  Neck: no JVD, carotid bruits, or masses Cardiac: RRR; no murmurs, rubs, or gallops,no edema  Respiratory:  clear to auscultation bilaterally, normal work of breathing GI: soft, nontender, nondistended, + BS MS: no deformity or atrophy  Skin: warm and dry, no rash Neuro:  Alert and Oriented x 3, Strength and sensation are intact Psych: euthymic mood, full affect  Wt Readings from Last 3 Encounters:  02/25/17 173 lb (78.5 kg)  02/24/17 185 lb (83.9 kg)  02/24/17 175 lb (79.4 kg)      Studies/Labs Reviewed:   EKG:  EKG is not ordered today.    Recent Labs: 12/14/2016: ALT 12 02/25/2017: BUN 13; Creatinine, Ser 0.63; Hemoglobin 16.0; Platelets 251; Potassium 4.8; Sodium 139   Lipid Panel    Component Value Date/Time   CHOL 244 (H) 03/19/2015 0812   TRIG 262 (H) 03/19/2015 0812   HDL 29 (L) 03/19/2015 0812   CHOLHDL 8.4 03/19/2015 0812   VLDL 52 (H) 03/19/2015 0812   LDLCALC 163 (H) 03/19/2015 0812   LDLDIRECT 160.9 06/21/2012 0833    Additional studies/ records that were reviewed today include:   Myoview 02/24/2017 Study Highlights    Nuclear stress EF:  55%.  The left ventricular ejection fraction is normal (55-65%).  Defect 1: There is a medium defect of moderate severity present in the basal inferior, basal inferolateral, mid inferior, apical inferior and apical lateral location.  Findings consistent with inferolateral ischemia.  This is an intermediate risk study.        ASSESSMENT:    1. Abnormal stress test   2. Coronary artery disease involving coronary bypass graft of native heart with unstable angina pectoris ([place])   3. Essential hypertension   4. Hyperlipidemia, unspecified hyperlipidemia type   5. Controlled type 2 diabetes mellitus without complication, without long-term current use of insulin (HCC)      PLAN:  In order of problems listed above:  1. Abnormal Myoview: Inferolateral ischemia of moderate size on today's Myoview, case discussed with Dr. Oval Linsey who recommended cardiac catheterization. I have also discussed with the patient as well. She wished to see what Dr. Claiborne Billings think before proceeding with the stress test.  Addendum: I have discussed with Dr. Claiborne Billings over the phone, he also agrees the plan for cardiac catheterization. I have discussed with the patient benefit and risk of the procedure during today's office visit.  - Risk and benefit of procedure explained to the patient who display clear understanding and agree to proceed. Discussed with patient possible procedural risk include bleeding, vascular injury, renal injury, arrythmia, MI, stroke and loss of limb or life.  - Since she is allergic to Iodine, we will pretreat her for possible allergy. She is aware that even with pretreatment, we cannot guarantee that she won't have a reaction to the contrast dye. She is also allergic to a lot of medications.  - Standard precath lab include CBC, basic metabolic panel, PT/INR  - Will attempt a cath from the left radial or right groin. Note Plavix is listed as an allergy  2. CAD s/p CABG x 4 (LIMA to LAD, SVG to  diag, SVG to OM, SVG to RCA) 2016 in [place]: Abnormal Myoview, described recent symptom was burning sensation. Given abnormal Myoview, I did recommend addition of Imdur, she was not  interested. May try it in the hospital under observation to make sure she does not have any side effect.  3. Hypertension: Hypertensive today, instructed her to take an additional 80 mg of losartan, she will need to permanently increase her valsartan to 160 mg twice a day.  4. Hyperlipidemia: Intolerant to statins, may need to consider PCSK 9 inhibitor, however patient was not interested  5. DM 2: She does not appears to be on any diabetes medication. Previous hemoglobin A1C in July 2016 was 8.7.    Medication Adjustments/Labs and Tests Ordered: Current medicines are reviewed at length with the patient today.  Concerns regarding medicines are outlined above.  Medication changes, Labs and Tests ordered today are listed in the Patient Instructions below. Patient Instructions  Medication Instructions: No changes   Follow-Up: We will call you with an update and instructions for the catheterization after speaking with Dr.[name].    Any Additional Special Instructions Will Be Listed Below (If Applicable).  If you experience any chest pain, please seek immediate medical attention or call 911.            Hilbert Corrigan, [place]  02/25/2017 7:23 PM    [facility] [address], [place], [place]  [address] Phone: [phone]; Fax: [phone]

## 2017-02-24 NOTE — Progress Notes (Signed)
Dr Oval Linsey reviewed Myoview study. Pt will see Almyra Deforest, PA today(06.20.18) at 1:45 pm to discuss test results. Stress changed to a Lexiscan Myoview as BP was too high for pt to walk on treadmill. Pt took morning dose of Valsartan, but held her Metoprolol. After Lexiscan infusion, pt took her Metoprolol and another dose of Valsartan at 10:35 am. Last BP taken post imaging(1130 am)-227/116.

## 2017-02-25 ENCOUNTER — Encounter: Payer: Self-pay | Admitting: *Deleted

## 2017-02-25 ENCOUNTER — Encounter: Payer: Self-pay | Admitting: Nurse Practitioner

## 2017-02-25 ENCOUNTER — Encounter: Payer: Self-pay | Admitting: Physician Assistant

## 2017-02-25 ENCOUNTER — Telehealth: Payer: Self-pay | Admitting: *Deleted

## 2017-02-25 ENCOUNTER — Ambulatory Visit (INDEPENDENT_AMBULATORY_CARE_PROVIDER_SITE_OTHER): Payer: Medicare Other | Admitting: Nurse Practitioner

## 2017-02-25 ENCOUNTER — Telehealth: Payer: Self-pay | Admitting: Physician Assistant

## 2017-02-25 ENCOUNTER — Other Ambulatory Visit: Payer: Self-pay | Admitting: Physician Assistant

## 2017-02-25 VITALS — BP 176/78 | HR 60 | Ht 62.75 in | Wt 173.0 lb

## 2017-02-25 DIAGNOSIS — K219 Gastro-esophageal reflux disease without esophagitis: Secondary | ICD-10-CM

## 2017-02-25 DIAGNOSIS — Z01818 Encounter for other preprocedural examination: Secondary | ICD-10-CM

## 2017-02-25 DIAGNOSIS — I25119 Atherosclerotic heart disease of native coronary artery with unspecified angina pectoris: Secondary | ICD-10-CM | POA: Diagnosis not present

## 2017-02-25 LAB — PROTIME-INR
INR: 1 (ref 0.8–1.2)
PROTHROMBIN TIME: 10.6 s (ref 9.1–12.0)

## 2017-02-25 LAB — CBC
HEMOGLOBIN: 16 g/dL — AB (ref 11.1–15.9)
Hematocrit: 47.1 % — ABNORMAL HIGH (ref 34.0–46.6)
MCH: 31.1 pg (ref 26.6–33.0)
MCHC: 34 g/dL (ref 31.5–35.7)
MCV: 92 fL (ref 79–97)
Platelets: 251 10*3/uL (ref 150–379)
RBC: 5.15 x10E6/uL (ref 3.77–5.28)
RDW: 13.2 % (ref 12.3–15.4)
WBC: 6.6 10*3/uL (ref 3.4–10.8)

## 2017-02-25 LAB — BASIC METABOLIC PANEL
BUN / CREAT RATIO: 21 (ref 12–28)
BUN: 13 mg/dL (ref 8–27)
CO2: 22 mmol/L (ref 20–29)
CREATININE: 0.63 mg/dL (ref 0.57–1.00)
Calcium: 10.4 mg/dL — ABNORMAL HIGH (ref 8.7–10.3)
Chloride: 100 mmol/L (ref 96–106)
GFR calc Af Amer: 103 mL/min/{1.73_m2} (ref >59)
GFR calc non Af Amer: 89 mL/min/{1.73_m2} (ref >59)
GLUCOSE: 142 mg/dL — AB (ref 65–99)
POTASSIUM: 4.8 mmol/L (ref 3.5–5.2)
SODIUM: 139 mmol/L (ref 134–144)

## 2017-02-25 MED ORDER — PREDNISONE 50 MG PO TABS: ORAL_TABLET | ORAL | 0 refills | Status: DC

## 2017-02-25 NOTE — Telephone Encounter (Signed)
Labs reviewed, no obvious contraindication to cath.

## 2017-02-25 NOTE — Telephone Encounter (Signed)
Patient has been called and informed of the need for a left heart catheterization. She stated that she would come in today for labs and pre cath instructions. The cath has been scheduled for Monday at 12 with Dr. Claiborne Billings. Prednisone 50 mg (one tablet q6 hours starting Sunday night at 6 pm) has been ordered for her contrast dye allergy, per Almyra Deforest, PA. Instructions will be given upon her arrival to the office.

## 2017-02-25 NOTE — Progress Notes (Signed)
HPI: Patient is 71 year old female previously followed by Dr. Olevia Perches for IBS. I saw her the end of May for burning in her chest with associated belching. The burning in her chest was nonexertional, non-radiating and without associated shortness of breath. Patient has multiple allergies and at that time was tolerating Zantac so we increased dose from 75 mg twice daily to 150 mg twice daily. She was advised to continue Gas-X and follow-up with me in 3-4 weeks.  Increasing the Zantac definitely helped but did not totally resolve the burning. A week later she called Cardiology with severe chest pain which was constant, worse when pushing lawnmower but also when swallowing . Cardiology felt symptoms were probably GI. Patient was concerned about her gallbladder so cardiology ordered u/s which was only suggested steatosis and a gallbladder polyp Zantac had helped the burning but there was still concern about pain when pushing mower. She underwent stress test, had a hypertensive responsve response . Myoview portion was significantly abnormal with inferolateral ischemia. and is scheduled for a cardiac on Monday.   Someone told her to drink a small amount of vinegar in water and burning has since resolved. She stopped Zantac, now just drinking a small amount of vinegar in water twice daily.    Past Medical History:  Diagnosis Date  . Anxiety   . CAD (coronary artery disease)    a. CABG x 4 in 2006 in [place] (LIMA->LAD, VG->Diag, VG->OM, VG->RCA); b. DES to midbody of SVG-PDA/PLA 07/2012; c. 03/2015 Myoview: EF 65%, small defect of mild severit in basal inferolateral wall, low risk.  . Complication of anesthesia    "quit breathing when I got ?Inovar" (07/28/2012)  . Depression   . Diverticulosis   . Dyslipidemia    Intolerant of statins  . Fecal incontinence   . GERD (gastroesophageal reflux disease)   . Hyperlipidemia   . Hyperplastic colon polyp   . IBS (irritable bowel syndrome)   .  Labile hypertension    a. 09/2016 Renal Artery duplex: nl renal arteries.  . Migraines    "had them in my 30's" (07/28/2012)  . Multiple allergies   . Obesity   . Steatohepatitis   . Type II diabetes mellitus (HCC)    a. no meds, diet controlled - takes cinnamon.     Past Surgical History:  Procedure Laterality Date  . ABDOMINAL HYSTERECTOMY  1970's  . BREAST LUMPECTOMY     bilateral  . CARDIAC CATHETERIZATION  2006  . CORONARY ANGIOPLASTY WITH STENT PLACEMENT  07/28/2012   "1; first time for me" (07/28/2012)  . CORONARY ARTERY BYPASS GRAFT  2006   CABG X4  . EXCISIONAL HEMORRHOIDECTOMY  1970's  . INGUINAL HERNIA REPAIR  1970's?   left  . LEFT HEART CATHETERIZATION WITH CORONARY/GRAFT ANGIOGRAM N/A 07/28/2012   Procedure: LEFT HEART CATHETERIZATION WITH Beatrix Fetters;  Surgeon: Burnell Blanks, MD;  Location: [facility] CATH LAB;  Service: Cardiovascular;  Laterality: N/A;  . PERCUTANEOUS CORONARY STENT INTERVENTION (PCI-S)  07/28/2012   Procedure: PERCUTANEOUS CORONARY STENT INTERVENTION (PCI-S);  Surgeon: Burnell Blanks, MD;  Location: [facility] CATH LAB;  Service: Cardiovascular;;  . TONSILLECTOMY AND ADENOIDECTOMY  ~ 1951   Family History  Problem Relation Age of Onset  . Coronary artery disease Father   . Colon cancer Father   . Colon polyps Father   . Heart disease Father   . Stroke Mother   . Hypertension Unknown   . Breast cancer Unknown  grandmother  . Diabetes Maternal Grandmother   . Diabetes Paternal Grandmother   . Esophageal cancer Neg Hx   . Rectal cancer Neg Hx   . Stomach cancer Neg Hx    Social History  Substance Use Topics  . Smoking status: Never Smoker  . Smokeless tobacco: Never Used  . Alcohol use No   Current Outpatient Prescriptions  Medication Sig Dispense Refill  . aspirin EC 81 MG tablet Take 1 tablet (81 mg total) by mouth daily. 30 tablet 5  . BIOTIN PO Take 500 mg by mouth daily.    . calcium carbonate (TUMS -  DOSED IN MG ELEMENTAL CALCIUM) 500 MG chewable tablet Chew 1 tablet by mouth 2 (two) times daily as needed. For heartburn    . cholecalciferol (VITAMIN D) 1000 UNITS tablet Take 1,000 Units by mouth daily.    . Cranberry-Vitamin C-Vitamin E (CRANBERRY PLUS VITAMIN C) 4200-20-3 MG-MG-UNIT CAPS Take 1 capsule by mouth daily.    . Magnesium 250 MG TABS Take 0.5 tablets by mouth 2 (two) times daily.    . metoprolol (LOPRESSOR) 100 MG tablet take 1 tablet by mouth twice a day 60 tablet 6  . nitroGLYCERIN (NITROSTAT) 0.4 MG SL tablet Place 1 tablet (0.4 mg total) under the tongue every 5 (five) minutes as needed for chest pain (up to 3 doses). 25 tablet 4  . predniSONE (DELTASONE) 50 MG tablet Take one tablet at 6pm Sunday night, one tablet at midnight, and one tablet at 6am Monday morning 3 tablet 0  . valsartan (DIOVAN) 160 MG tablet Take 80 mg by mouth 2 (two) times daily.    . vitamin B-12 (CYANOCOBALAMIN) 1000 MCG tablet Take 1,000 mcg by mouth daily.     No current facility-administered medications for this visit.    Allergies  Allergen Reactions  . Betadine [Povidone Iodine] Swelling  . Codeine Anaphylaxis and Other (See Comments)    "quit breathing" (07/28/2012)  . Demerol Other (See Comments)    "quit breathing" (07/28/2012)  . Latex Other (See Comments)    "quit breathing" (07/28/2012)  . Other Other (See Comments)    Perfume, Any Fragrance. "quit breathing" (07/28/2012)  . Percocet [Oxycodone-Acetaminophen] Other (See Comments)    "quit breathing; disoriented" (07/28/2012)  . Plavix [Clopidogrel Bisulfate] Other (See Comments)    "get hot; like I'm burning up inside; had to put me in shower after OHS because of that" (07/28/2012)  . Red Dye Other (See Comments)    "quit breathing" (07/28/2012)  . Shrimp Flavor Shortness Of Breath and Other (See Comments)    "broke out in knots all over" (07/28/2012)  . Shrimp [Shellfish Allergy] Shortness Of Breath and Other (See Comments)     "broke out in knots all over" (07/28/2012)  . Sulfonamide Derivatives Rash and Other (See Comments)    "quit breathing" (07/28/2012)  . Tylenol [Acetaminophen] Shortness Of Breath  . Iohexol     Unknown  . Statins      Review of Systems: All systems reviewed and negative except where noted in HPI.    Physical Exam: BP (!) 176/78   Pulse 60   Ht 5' 2.75" (1.594 m)   Wt 173 lb (78.5 kg)   BMI 30.89 kg/m  Constitutional:  Well-developed, white female in no acute distress. Psychiatric: Normal mood and affect. Behavior is normal. EENT: Pupils normal.  Conjunctivae are normal. No scleral icterus. Neck supple.  Cardiovascular: Normal rate, regular rhythm. No edema Pulmonary/chest: Effort normal and breath sounds normal.  No wheezing, rales or rhonchi. Abdominal: Soft, nondistended. Nontender. Bowel sounds active throughout. There are no masses palpable. No hepatomegaly. Lymphadenopathy: No cervical adenopathy noted. Neurological: Alert and oriented to person place and time. Skin: Skin is warm and dry. No rashes noted.   ASSESSMENT AND PLAN:  1.  71 yo female with non-exertional chest pain (burning) and gas / belching. Symptoms improved after recently increasing Zantac. She stopped Zantac after friend told her to drink a small amount of vinegar twice daily. Asymptomatic with the vinegar. She has CAD and has been in contact with Cardiology about chest pain. Her symptoms had been mainly GI in nature but she recently had pain when pushing lawm mover. Her symptoms lead to stress test which was very abnormal. She is for cardiac cath next week.  -If belching and chest burning continue following cardiac workup then will need to she her back for continuation of GI  workup.   2. Gallbladder polyp. Single 45m polyp seen on u/s. This is not causing her symptoms. I reassured about this.   3. Fatty liver on u/s. Normal ultrasound. Will monitor this.   PTye Savoy NP  02/25/2017, 10:58  AM  Cc: PCrist Infante MD

## 2017-02-25 NOTE — Patient Instructions (Signed)
If you are age 73 or older, your body mass index should be between 23-30. Your Body mass index is 30.89 kg/m. If this is out of the aforementioned range listed, please consider follow up with your Primary Care Provider.  If you are age 64 or younger, your body mass index should be between 19-25. Your Body mass index is 30.89 kg/m. If this is out of the aformentioned range listed, please consider follow up with your Primary Care Provider.   Please call us on Monday after your heart cath.  Follow up as needed.  Thank you for choosing me and [facility].   Tye Savoy, NP

## 2017-02-25 NOTE — Telephone Encounter (Signed)
Discussed with Dr. Claiborne Billings yesterday, informed patient regarding recommendation. She agreed to proceed with cardiac cath on Monday. Risk and Benefit of the procedure detailed in yesterday's office note and stated again today as well.  Risk and benefit of procedure explained to the patient who display clear understanding and agree to proceed.  Discussed with patient possible procedural risk include bleeding, vascular injury, renal injury, allergic reaction, arrythmia, MI, stroke and loss of limb or life.   She will need to be premedicated for iodine allergy. We have sent 3 doses of 32m prednisone to her pharmacy, she will start taking around 6 PM the night before procedure, then midnight, then 6AM on the day of procedure. I ordered IV pepcid and benadryl to be given prior to cath. Note she is allergic to plavix.   SHilbert CorriganPA Pager: [phone]

## 2017-02-26 ENCOUNTER — Telehealth: Payer: Self-pay

## 2017-02-26 NOTE — Telephone Encounter (Signed)
Left detailed message on VM per DPR.  Patient contacted pre-catheterization at [facility] scheduled for: 03/01/2017 @ 1200  Verified arrival time and place:  NT @ 1000  Confirmed AM meds to be taken pre-cath with sip of water:  Left detailed message on medication schedule.  Pt to take 50 mg prednisone @ 6 pm night before procedure, 50 mg prednisone @ midnight night before procedure and 50 mg morning of procedure.  Also noted Pt should take her ASA 81 mg morning of procedure.  Per prior instruction, Pt to hold valsartan AM of procedure.  Confirmed patient has responsible person to drive home post procedure and observe patient for 24 hours:  Directed on message  Left this nurse name and # for call back if any further questions.  Addl concerns:   DYE Allergy, prior CABG, Plavix allery

## 2017-02-26 NOTE — Telephone Encounter (Signed)
Return call received from Pt.   Verified arrival time and place:  Pt indicates she knows where to go, will arrive at 1000  Confirmed AM meds to be taken pre-cath with sip of water:  Pt states she normally takes her ASA at bedtime.  Asked if she should also take in am before procedure.  Notified Pt that she should take in AM before procedure.  Pt indicates understanding.  Discussed Pt prednisone regimen, Pt indicates understanding and has filled prescription.  Pt states she does not take valsartan in the AM.  Confirmed patient has responsible person to drive home post procedure and observe patient for 24 hours:  Yes, Pt's son  Addl concerns:  Prior CABG, DYE and PLAVIX allergy

## 2017-02-27 NOTE — Progress Notes (Signed)
Reviewed and agree with initial management plan.  Jonel Sick T. Japheth Diekman, MD FACG 

## 2017-03-01 ENCOUNTER — Encounter (HOSPITAL_COMMUNITY)
Admission: RE | Disposition: A | Discharge status: Home / Self Care | Payer: Self-pay | Source: Ambulatory Visit | Attending: Cardiovascular Disease

## 2017-03-01 ENCOUNTER — Other Ambulatory Visit: Payer: Self-pay

## 2017-03-01 ENCOUNTER — Ambulatory Visit (HOSPITAL_COMMUNITY)
Admission: RE | Admit: 2017-03-01 | Discharge: 2017-03-02 | Disposition: A | Discharge status: Home / Self Care | Payer: Medicare Other | Source: Ambulatory Visit | Attending: Cardiovascular Disease | Admitting: Cardiovascular Disease

## 2017-03-01 ENCOUNTER — Encounter (HOSPITAL_COMMUNITY): Payer: Self-pay

## 2017-03-01 DIAGNOSIS — T82855A Stenosis of coronary artery stent, initial encounter: Secondary | ICD-10-CM | POA: Insufficient documentation

## 2017-03-01 DIAGNOSIS — Z7982 Long term (current) use of aspirin: Secondary | ICD-10-CM | POA: Insufficient documentation

## 2017-03-01 DIAGNOSIS — K589 Irritable bowel syndrome without diarrhea: Secondary | ICD-10-CM | POA: Insufficient documentation

## 2017-03-01 DIAGNOSIS — I2582 Chronic total occlusion of coronary artery: Secondary | ICD-10-CM | POA: Diagnosis not present

## 2017-03-01 DIAGNOSIS — I25708 Atherosclerosis of coronary artery bypass graft(s), unspecified, with other forms of angina pectoris: Secondary | ICD-10-CM

## 2017-03-01 DIAGNOSIS — E669 Obesity, unspecified: Secondary | ICD-10-CM | POA: Diagnosis not present

## 2017-03-01 DIAGNOSIS — Z91041 Radiographic dye allergy status: Secondary | ICD-10-CM | POA: Diagnosis not present

## 2017-03-01 DIAGNOSIS — K7581 Nonalcoholic steatohepatitis (NASH): Secondary | ICD-10-CM | POA: Insufficient documentation

## 2017-03-01 DIAGNOSIS — K219 Gastro-esophageal reflux disease without esophagitis: Secondary | ICD-10-CM | POA: Insufficient documentation

## 2017-03-01 DIAGNOSIS — Z6830 Body mass index (BMI) 30.0-30.9, adult: Secondary | ICD-10-CM | POA: Diagnosis not present

## 2017-03-01 DIAGNOSIS — I25710 Atherosclerosis of autologous vein coronary artery bypass graft(s) with unstable angina pectoris: Secondary | ICD-10-CM | POA: Diagnosis not present

## 2017-03-01 DIAGNOSIS — I200 Unstable angina: Secondary | ICD-10-CM | POA: Diagnosis present

## 2017-03-01 DIAGNOSIS — Y831 Surgical operation with implant of artificial internal device as the cause of abnormal reaction of the patient, or of later complication, without mention of misadventure at the time of the procedure: Secondary | ICD-10-CM | POA: Insufficient documentation

## 2017-03-01 DIAGNOSIS — Z91013 Allergy to seafood: Secondary | ICD-10-CM | POA: Diagnosis not present

## 2017-03-01 DIAGNOSIS — Z91040 Latex allergy status: Secondary | ICD-10-CM | POA: Diagnosis not present

## 2017-03-01 DIAGNOSIS — F419 Anxiety disorder, unspecified: Secondary | ICD-10-CM | POA: Insufficient documentation

## 2017-03-01 DIAGNOSIS — Z885 Allergy status to narcotic agent status: Secondary | ICD-10-CM | POA: Diagnosis not present

## 2017-03-01 DIAGNOSIS — F329 Major depressive disorder, single episode, unspecified: Secondary | ICD-10-CM | POA: Diagnosis not present

## 2017-03-01 DIAGNOSIS — I10 Essential (primary) hypertension: Secondary | ICD-10-CM | POA: Diagnosis not present

## 2017-03-01 DIAGNOSIS — E119 Type 2 diabetes mellitus without complications: Secondary | ICD-10-CM | POA: Diagnosis not present

## 2017-03-01 DIAGNOSIS — Z882 Allergy status to sulfonamides status: Secondary | ICD-10-CM | POA: Insufficient documentation

## 2017-03-01 DIAGNOSIS — Z955 Presence of coronary angioplasty implant and graft: Secondary | ICD-10-CM

## 2017-03-01 DIAGNOSIS — R9439 Abnormal result of other cardiovascular function study: Secondary | ICD-10-CM

## 2017-03-01 DIAGNOSIS — Z8249 Family history of ischemic heart disease and other diseases of the circulatory system: Secondary | ICD-10-CM | POA: Insufficient documentation

## 2017-03-01 DIAGNOSIS — E785 Hyperlipidemia, unspecified: Secondary | ICD-10-CM | POA: Insufficient documentation

## 2017-03-01 HISTORY — PX: LEFT HEART CATH AND CORS/GRAFTS ANGIOGRAPHY: CATH118250

## 2017-03-01 HISTORY — PX: CORONARY STENT INTERVENTION: CATH118234

## 2017-03-01 LAB — GLUCOSE, CAPILLARY
GLUCOSE-CAPILLARY: 218 mg/dL — AB (ref 65–99)
GLUCOSE-CAPILLARY: 152 mg/dL — AB (ref 65–99)
Glucose-Capillary: 247 mg/dL — ABNORMAL HIGH (ref 65–99)
Glucose-Capillary: 187 mg/dL — ABNORMAL HIGH (ref 65–99)

## 2017-03-01 LAB — POCT ACTIVATED CLOTTING TIME: Activated Clotting Time: 345 seconds

## 2017-03-01 LAB — TROPONIN I: Troponin I: 1.07 ng/mL (ref <0.03)

## 2017-03-01 SURGERY — LEFT HEART CATH AND CORS/GRAFTS ANGIOGRAPHY
Anesthesia: LOCAL

## 2017-03-01 MED ORDER — HEPARIN (PORCINE) IN NACL 2-0.9 UNIT/ML-% IJ SOLN
INTRAMUSCULAR | Status: AC | PRN
  Administered 2017-03-01: 1000 mL

## 2017-03-01 MED ORDER — SODIUM CHLORIDE 0.9% FLUSH: 3.0000 mL | Freq: Two times a day (BID) | INTRAVENOUS | Status: DC

## 2017-03-01 MED ORDER — SODIUM CHLORIDE 0.9% FLUSH: 3.0000 mL | INTRAVENOUS | Status: DC | PRN

## 2017-03-01 MED ORDER — SODIUM CHLORIDE 0.9 % WEIGHT BASED INFUSION: 1.0000 mL/kg/h | INTRAVENOUS | Status: DC

## 2017-03-01 MED ORDER — MIDAZOLAM HCL 2 MG/2ML IJ SOLN
INTRAMUSCULAR | Status: AC
  Filled 2017-03-01: qty 2

## 2017-03-01 MED ORDER — ISOSORBIDE MONONITRATE ER 30 MG PO TB24
15.0000 mg | ORAL_TABLET | Freq: Every day | ORAL | Status: DC
  Administered 2017-03-02: 09:00:00 15 mg via ORAL
  Filled 2017-03-01 (×2): qty 1

## 2017-03-01 MED ORDER — FENTANYL CITRATE (PF) 100 MCG/2ML IJ SOLN
INTRAMUSCULAR | Status: AC
  Filled 2017-03-01: qty 2

## 2017-03-01 MED ORDER — IOPAMIDOL (ISOVUE-370) INJECTION 76%
INTRAVENOUS | Status: DC | PRN
  Administered 2017-03-01: 275 mL via INTRA_ARTERIAL

## 2017-03-01 MED ORDER — SODIUM CHLORIDE 0.9 % IV SOLN: 250.0000 mL | INTRAVENOUS | Status: DC | PRN

## 2017-03-01 MED ORDER — IOPAMIDOL (ISOVUE-370) INJECTION 76%
INTRAVENOUS | Status: AC
  Filled 2017-03-01: qty 50

## 2017-03-01 MED ORDER — NITROGLYCERIN 1 MG/10 ML FOR IR/CATH LAB
INTRA_ARTERIAL | Status: DC | PRN
  Administered 2017-03-01: 200 ug

## 2017-03-01 MED ORDER — SODIUM CHLORIDE 0.9 % WEIGHT BASED INFUSION: 3.0000 mL/kg/h | INTRAVENOUS | Status: DC

## 2017-03-01 MED ORDER — DIPHENHYDRAMINE HCL 50 MG/ML IJ SOLN
25.0000 mg | INTRAMUSCULAR | Status: AC
  Administered 2017-03-01: 25 mg via INTRAVENOUS

## 2017-03-01 MED ORDER — IOPAMIDOL (ISOVUE-370) INJECTION 76%
INTRAVENOUS | Status: AC
  Filled 2017-03-01: qty 125

## 2017-03-01 MED ORDER — ACETAMINOPHEN 325 MG PO TABS: 650.0000 mg | ORAL_TABLET | ORAL | Status: DC | PRN

## 2017-03-01 MED ORDER — ATROPINE SULFATE 1 MG/10ML IJ SOSY
PREFILLED_SYRINGE | INTRAMUSCULAR | Status: AC
  Filled 2017-03-01: qty 10

## 2017-03-01 MED ORDER — IOPAMIDOL (ISOVUE-370) INJECTION 76%
INTRAVENOUS | Status: AC
  Filled 2017-03-01: qty 100

## 2017-03-01 MED ORDER — LIDOCAINE HCL (PF) 1 % IJ SOLN
INTRAMUSCULAR | Status: DC | PRN
  Administered 2017-03-01: 10 mL

## 2017-03-01 MED ORDER — ONDANSETRON HCL 4 MG/2ML IJ SOLN: 4.0000 mg | Freq: Four times a day (QID) | INTRAMUSCULAR | Status: DC | PRN

## 2017-03-01 MED ORDER — FAMOTIDINE IN NACL 20-0.9 MG/50ML-% IV SOLN
20.0000 mg | INTRAVENOUS | Status: AC
  Administered 2017-03-01: 20 mg via INTRAVENOUS

## 2017-03-01 MED ORDER — METOPROLOL TARTRATE 12.5 MG HALF TABLET
12.5000 mg | ORAL_TABLET | Freq: Two times a day (BID) | ORAL | Status: DC
  Administered 2017-03-01 – 2017-03-02 (×2): 12.5 mg via ORAL
  Filled 2017-03-01 (×2): qty 1

## 2017-03-01 MED ORDER — NITROGLYCERIN IN D5W 200-5 MCG/ML-% IV SOLN
INTRAVENOUS | Status: AC
  Filled 2017-03-01: qty 250

## 2017-03-01 MED ORDER — TICAGRELOR 90 MG PO TABS
ORAL_TABLET | ORAL | Status: AC
  Filled 2017-03-01: qty 1

## 2017-03-01 MED ORDER — ASPIRIN 81 MG PO CHEW: 81.0000 mg | CHEWABLE_TABLET | ORAL | Status: DC

## 2017-03-01 MED ORDER — DIAZEPAM 5 MG PO TABS: 5.0000 mg | ORAL_TABLET | Freq: Four times a day (QID) | ORAL | Status: DC | PRN

## 2017-03-01 MED ORDER — HEPARIN (PORCINE) IN NACL 2-0.9 UNIT/ML-% IJ SOLN
INTRAMUSCULAR | Status: AC
  Filled 2017-03-01: qty 1000

## 2017-03-01 MED ORDER — FAMOTIDINE IN NACL 20-0.9 MG/50ML-% IV SOLN
INTRAVENOUS | Status: AC
  Administered 2017-03-01: 20 mg via INTRAVENOUS
  Filled 2017-03-01: qty 50

## 2017-03-01 MED ORDER — BIVALIRUDIN TRIFLUOROACETATE 250 MG IV SOLR
INTRAVENOUS | Status: DC | PRN
  Administered 2017-03-01: 1.75 mg/kg/h via INTRAVENOUS

## 2017-03-01 MED ORDER — MIDAZOLAM HCL 2 MG/2ML IJ SOLN
INTRAMUSCULAR | Status: DC | PRN
Start: 2017-03-01 — End: 2017-03-01
  Administered 2017-03-01: 1 mg via INTRAVENOUS
  Administered 2017-03-01: 2 mg via INTRAVENOUS
  Administered 2017-03-01: 1 mg via INTRAVENOUS

## 2017-03-01 MED ORDER — HYDRALAZINE HCL 20 MG/ML IJ SOLN: 5.0000 mg | INTRAMUSCULAR | Status: AC | PRN

## 2017-03-01 MED ORDER — PANTOPRAZOLE SODIUM 40 MG IV SOLR
40.0000 mg | Freq: Once | INTRAVENOUS | Status: AC
  Administered 2017-03-01: 40 mg via INTRAVENOUS
  Filled 2017-03-01: qty 40

## 2017-03-01 MED ORDER — SODIUM CHLORIDE 0.9 % IV SOLN: INTRAVENOUS | Status: DC

## 2017-03-01 MED ORDER — INSULIN ASPART 100 UNIT/ML ~~LOC~~ SOLN: 0.0000 [IU] | Freq: Three times a day (TID) | SUBCUTANEOUS | Status: DC

## 2017-03-01 MED ORDER — ASPIRIN 81 MG PO CHEW
81.0000 mg | CHEWABLE_TABLET | Freq: Every day | ORAL | Status: DC
  Administered 2017-03-02: 08:00:00 81 mg via ORAL
  Filled 2017-03-01: qty 1

## 2017-03-01 MED ORDER — TICAGRELOR 90 MG PO TABS
ORAL_TABLET | ORAL | Status: DC | PRN
  Administered 2017-03-01: 180 mg via ORAL

## 2017-03-01 MED ORDER — ATORVASTATIN CALCIUM 10 MG PO TABS
10.0000 mg | ORAL_TABLET | Freq: Every day | ORAL | Status: DC
  Administered 2017-03-01: 10 mg via ORAL
  Filled 2017-03-01: qty 1

## 2017-03-01 MED ORDER — TICAGRELOR 90 MG PO TABS
90.0000 mg | ORAL_TABLET | Freq: Two times a day (BID) | ORAL | Status: DC
  Administered 2017-03-02: 05:00:00 90 mg via ORAL
  Filled 2017-03-01: qty 1

## 2017-03-01 MED ORDER — BIVALIRUDIN BOLUS VIA INFUSION - CUPID
INTRAVENOUS | Status: DC | PRN
  Administered 2017-03-01: 58.875 mg via INTRAVENOUS

## 2017-03-01 MED ORDER — DIPHENHYDRAMINE HCL 50 MG/ML IJ SOLN
INTRAMUSCULAR | Status: AC
  Administered 2017-03-01: 25 mg via INTRAVENOUS
  Filled 2017-03-01: qty 1

## 2017-03-01 MED ORDER — LIDOCAINE HCL 1 % IJ SOLN
INTRAMUSCULAR | Status: AC
  Filled 2017-03-01: qty 20

## 2017-03-01 MED ORDER — FENTANYL CITRATE (PF) 100 MCG/2ML IJ SOLN
INTRAMUSCULAR | Status: DC | PRN
  Administered 2017-03-01 (×2): 25 ug via INTRAVENOUS
  Administered 2017-03-01: 50 ug via INTRAVENOUS

## 2017-03-01 MED ORDER — LABETALOL HCL 5 MG/ML IV SOLN: 10.0000 mg | INTRAVENOUS | Status: AC | PRN

## 2017-03-01 MED ORDER — NITROGLYCERIN IN D5W 200-5 MCG/ML-% IV SOLN
INTRAVENOUS | Status: AC | PRN
  Administered 2017-03-01: 10 ug/min via INTRAVENOUS

## 2017-03-01 MED ORDER — BIVALIRUDIN TRIFLUOROACETATE 250 MG IV SOLR
INTRAVENOUS | Status: AC
  Filled 2017-03-01: qty 250

## 2017-03-01 MED ORDER — NITROGLYCERIN 1 MG/10 ML FOR IR/CATH LAB
INTRA_ARTERIAL | Status: AC
  Filled 2017-03-01: qty 10

## 2017-03-01 SURGICAL SUPPLY — 20 items
BALLN EMERGE MR 2.5X15 (BALLOONS) ×2
BALLN ~~LOC~~ EMERGE MR 3.75X15 (BALLOONS) ×2
BALLOON EMERGE MR 2.5X15 (BALLOONS) ×1 IMPLANT
BALLOON ~~LOC~~ EMERGE MR 3.75X15 (BALLOONS) ×1 IMPLANT
CATH INFINITI 5 FR IM (CATHETERS) ×2 IMPLANT
CATH INFINITI 5 FR RCB (CATHETERS) ×2 IMPLANT
CATH INFINITI 5FR MULTPACK ANG (CATHETERS) ×2 IMPLANT
CATHETER LAUNCHER 6FR RCB (CATHETERS) ×2 IMPLANT
FILTERWIRE EZ 3.5-5.5 190CM (WIRE) ×4 IMPLANT
KIT ENCORE 26 ADVANTAGE (KITS) ×4 IMPLANT
KIT HEART LEFT (KITS) ×2 IMPLANT
PACK CARDIAC CATHETERIZATION (CUSTOM PROCEDURE TRAY) ×2 IMPLANT
SHEATH PINNACLE 5F 10CM (SHEATH) ×2 IMPLANT
SHEATH PINNACLE 6F 10CM (SHEATH) ×2 IMPLANT
STENT SYNERGY DES 3.5X32 (Permanent Stent) ×2 IMPLANT
SYR MEDRAD MARK V 150ML (SYRINGE) ×2 IMPLANT
TRANSDUCER W/STOPCOCK (MISCELLANEOUS) ×2 IMPLANT
TUBING CIL FLEX 10 FLL-RA (TUBING) ×2 IMPLANT
WIRE EMERALD 3MM-J .035X150CM (WIRE) ×2 IMPLANT
WIRE EMERALD 3MM-J .035X260CM (WIRE) ×2 IMPLANT

## 2017-03-01 NOTE — Progress Notes (Addendum)
Pt c/o "chest pain from my reflux" It is mid chest burning she reports is typically relieved at home with OTC meds,  She refused nitro and took meds from home, gas x and two tums.  She reports she has this pain daily.

## 2017-03-01 NOTE — Progress Notes (Signed)
Pt reports chest discomfort is "50% better 5/10" since taking home meds and this is the pain she has been getting every night.

## 2017-03-01 NOTE — Interval H&P Note (Signed)
Cath Lab Visit (complete for each Cath Lab visit)  Clinical Evaluation Leading to the Procedure:   ACS: No.  Non-ACS:    Anginal Classification: CCS III  Anti-ischemic medical therapy: Minimal Therapy (1 class of medications)  Non-Invasive Test Results: Intermediate-risk stress test findings: cardiac mortality 1-3%/year  Prior CABG: Previous CABG      History and Physical Interval Note:  03/01/2017 2:55 PM  Ruth [name]  has presented today for surgery, with the diagnosis of abn stress  The various methods of treatment have been discussed with the patient and family. After consideration of risks, benefits and other options for treatment, the patient has consented to  Procedure(s): Left Heart Cath and Cors/Grafts Angiography (N/A) as a surgical intervention .  The patient's history has been reviewed, patient examined, no change in status, stable for surgery.  I have reviewed the patient's chart and labs.  Questions were answered to the patient's satisfaction.     [name]

## 2017-03-01 NOTE — H&P (View-Only) (Signed)
Cardiology Office Note    Date:  02/25/2017   ID:  [name], [name] [birth year], MRN [id number]  PCP:  Crist Infante, MD  Cardiologist:  Dr. Claiborne Billings   Chief Complaint  Patient presents with  . Follow-up    add- on for Dr. Claiborne Billings, abnormal stress test    History of Present Illness:  Ruth [name] is a 71 y.o. female with PMH of CAD s/p CABG x 4 (LIMA to LAD, SVG to diag, SVG to OM, SVG to RCA) 2016 in [place], hyperlipidemia, hypertension and DM 2. She had DES to mid SVG to PDA/PLA in November 2013. Last Myoview obtained in July 2016 showed EF 65%, small defect of mild severity in basal inferolateral wall, overall considered low risk finding. Last echocardiogram obtained on 03/13/2015 showed EF 55-60%, mild MR, PA peak pressure 32 mmHg. She is intolerant to statins but have never tried Zetia. Dr. Claiborne Billings did discuss with her regarding Repatha, however she was not interested. She was last seen by Dr. Claiborne Billings on 12/17/2016, her blood pressure was very labile and elevated on that day. Her valsartan was increased to 160 mg twice a day.  She is an add-on to my schedule today. I last saw the patient on 02/15/2017 at which time she was complaining of some gas pain. Her symptom occurs when she swallows, however also occurs when she pushed a lawnmower. The exertional nature of the symptom was quite concerning. I recommended a stress test, she underwent a stress test today, she had hypertensive response, however Myoview portion was significantly abnormal with medium defect of moderate severity present in the basal inferior, basal inferolateral, mid inferior, apical inferior, apical lateral location consistent with his inferolateral ischemia. Ejection fraction was calculated to be 55%. I discussed various options with the patient, she was still having chest discomfort. I have reviewed the images with DOD Dr. Oval Linsey. I discussed the benefit and risk of the cardiac catheterization with the patient. She  wished to hear from Dr. Claiborne Billings before deciding on the procedure, I will discuss the case with Dr. Claiborne Billings. I recommended at addition of Imdur, however she does not wish to try any new medication at this time. Her blood pressure is elevated today, she has not been taking 160 mg twice a day of losartan like Dr. Claiborne Billings recommended, instead she is taking 80 mg twice a day, I asked her to increase the medication to 160 mg twice a day.   Past Medical History:  Diagnosis Date  . Anxiety   . CAD (coronary artery disease)    a. CABG x 4 in 2006 in [place] (LIMA->LAD, VG->Diag, VG->OM, VG->RCA); b. DES to midbody of SVG-PDA/PLA 07/2012; c. 03/2015 Myoview: EF 65%, small defect of mild severit in basal inferolateral wall, low risk.  . Complication of anesthesia    "quit breathing when I got ?[place]ar" (07/28/2012)  . Depression   . Diverticulosis   . Dyslipidemia    Intolerant of statins  . Fecal incontinence   . GERD (gastroesophageal reflux disease)   . Hyperlipidemia   . Hyperplastic colon polyp   . IBS (irritable bowel syndrome)   . Labile hypertension    a. 09/2016 Renal Artery duplex: nl renal arteries.  . Migraines    "had them in my 30's" (07/28/2012)  . Multiple allergies   . Obesity   . Steatohepatitis   . Type II diabetes mellitus (HCC)    a. no meds, diet controlled - takes cinnamon.  Past Surgical History:  Procedure Laterality Date  . ABDOMINAL HYSTERECTOMY  1970's  . BREAST LUMPECTOMY     bilateral  . CARDIAC CATHETERIZATION  2006  . CORONARY ANGIOPLASTY WITH STENT PLACEMENT  07/28/2012   "1; first time for me" (07/28/2012)  . CORONARY ARTERY BYPASS GRAFT  2006   CABG X4  . EXCISIONAL HEMORRHOIDECTOMY  1970's  . INGUINAL HERNIA REPAIR  1970's?   left  . LEFT HEART CATHETERIZATION WITH CORONARY/GRAFT ANGIOGRAM N/A 07/28/2012   Procedure: LEFT HEART CATHETERIZATION WITH Beatrix Fetters;  Surgeon: Burnell Blanks, MD;  Location: [facility] CATH LAB;  Service:  Cardiovascular;  Laterality: N/A;  . PERCUTANEOUS CORONARY STENT INTERVENTION (PCI-S)  07/28/2012   Procedure: PERCUTANEOUS CORONARY STENT INTERVENTION (PCI-S);  Surgeon: Burnell Blanks, MD;  Location: [facility] CATH LAB;  Service: Cardiovascular;;  . TONSILLECTOMY AND ADENOIDECTOMY  ~ 1951    Current Medications: Outpatient Medications Prior to Visit  Medication Sig Dispense Refill  . aspirin EC 81 MG tablet Take 1 tablet (81 mg total) by mouth daily. 30 tablet 5  . calcium carbonate (TUMS - DOSED IN MG ELEMENTAL CALCIUM) 500 MG chewable tablet Chew 1-2 tablets by mouth 3 (three) times daily as needed for heartburn.     . cholecalciferol (VITAMIN D) 1000 UNITS tablet Take 1,000 Units by mouth daily.    . Cranberry-Vitamin C-Vitamin E (CRANBERRY PLUS VITAMIN C) 4200-20-3 MG-MG-UNIT CAPS Take 1 capsule by mouth daily.    . Magnesium 250 MG TABS Take 250 mg by mouth daily.     . metoprolol (LOPRESSOR) 100 MG tablet take 1 tablet by mouth twice a day 60 tablet 6  . nitroGLYCERIN (NITROSTAT) 0.4 MG SL tablet Place 1 tablet (0.4 mg total) under the tongue every 5 (five) minutes as needed for chest pain (up to 3 doses). 25 tablet 4  . vitamin B-12 (CYANOCOBALAMIN) 1000 MCG tablet Take 1,000 mcg by mouth daily.    Marland Kitchen BIOTIN PO Take 500 mg by mouth daily.    . valsartan (DIOVAN) 160 MG tablet Take 1 tablet (160 mg total) by mouth 2 (two) times daily. (Patient taking differently: Take 80 mg by mouth 2 (two) times daily. ) 60 tablet 6   No facility-administered medications prior to visit.      Allergies:   Betadine [povidone iodine]; Codeine; Demerol; Latex; Other; Percocet [oxycodone-acetaminophen]; Plavix [clopidogrel bisulfate]; Red dye; Shellfish allergy; Sulfonamide derivatives; Tylenol [acetaminophen]; Iohexol; and Statins   Social History   Social History  . Marital status: Widowed    Spouse name: N/A  . Number of children: 2  . Years of education: N/A   Occupational History  . retired     Social History Main Topics  . Smoking status: Never Smoker  . Smokeless tobacco: Never Used  . Alcohol use No  . Drug use: No  . Sexual activity: No   Other Topics Concern  . None   Social History Narrative  . None     Family History:  The patient's family history includes Colon cancer in her father; Colon polyps in her father; Coronary artery disease in her father; Diabetes in her maternal grandmother and paternal grandmother; Heart disease in her father; Stroke in her mother.   ROS:   Please see the history of present illness.    ROS All other systems reviewed and are negative.   PHYSICAL EXAM:   VS:  BP (!) 220/92 (BP Location: Left Arm)   Pulse 69   Ht 5' 3.5" (1.613  m)   Wt 175 lb (79.4 kg)   SpO2 97%   BMI 30.51 kg/m    GEN: Well nourished, well developed, in no acute distress  HEENT: normal  Neck: no JVD, carotid bruits, or masses Cardiac: RRR; no murmurs, rubs, or gallops,no edema  Respiratory:  clear to auscultation bilaterally, normal work of breathing GI: soft, nontender, nondistended, + BS MS: no deformity or atrophy  Skin: warm and dry, no rash Neuro:  Alert and Oriented x 3, Strength and sensation are intact Psych: euthymic mood, full affect  Wt Readings from Last 3 Encounters:  02/25/17 173 lb (78.5 kg)  02/24/17 185 lb (83.9 kg)  02/24/17 175 lb (79.4 kg)      Studies/Labs Reviewed:   EKG:  EKG is not ordered today.    Recent Labs: 12/14/2016: ALT 12 02/25/2017: BUN 13; Creatinine, Ser 0.63; Hemoglobin 16.0; Platelets 251; Potassium 4.8; Sodium 139   Lipid Panel    Component Value Date/Time   CHOL 244 (H) 03/19/2015 0812   TRIG 262 (H) 03/19/2015 0812   HDL 29 (L) 03/19/2015 0812   CHOLHDL 8.4 03/19/2015 0812   VLDL 52 (H) 03/19/2015 0812   LDLCALC 163 (H) 03/19/2015 0812   LDLDIRECT 160.9 06/21/2012 0833    Additional studies/ records that were reviewed today include:   Myoview 02/24/2017 Study Highlights    Nuclear stress EF:  55%.  The left ventricular ejection fraction is normal (55-65%).  Defect 1: There is a medium defect of moderate severity present in the basal inferior, basal inferolateral, mid inferior, apical inferior and apical lateral location.  Findings consistent with inferolateral ischemia.  This is an intermediate risk study.        ASSESSMENT:    1. Abnormal stress test   2. Coronary artery disease involving coronary bypass graft of native heart with unstable angina pectoris ([place])   3. Essential hypertension   4. Hyperlipidemia, unspecified hyperlipidemia type   5. Controlled type 2 diabetes mellitus without complication, without long-term current use of insulin (HCC)      PLAN:  In order of problems listed above:  1. Abnormal Myoview: Inferolateral ischemia of moderate size on today's Myoview, case discussed with Dr. Oval Linsey who recommended cardiac catheterization. I have also discussed with the patient as well. She wished to see what Dr. Claiborne Billings think before proceeding with the stress test.  Addendum: I have discussed with Dr. Claiborne Billings over the phone, he also agrees the plan for cardiac catheterization. I have discussed with the patient benefit and risk of the procedure during today's office visit.  - Risk and benefit of procedure explained to the patient who display clear understanding and agree to proceed. Discussed with patient possible procedural risk include bleeding, vascular injury, renal injury, arrythmia, MI, stroke and loss of limb or life.  - Since she is allergic to Iodine, we will pretreat her for possible allergy. She is aware that even with pretreatment, we cannot guarantee that she won't have a reaction to the contrast dye. She is also allergic to a lot of medications.  - Standard precath lab include CBC, basic metabolic panel, PT/INR  - Will attempt a cath from the left radial or right groin. Note Plavix is listed as an allergy  2. CAD s/p CABG x 4 (LIMA to LAD, SVG to  diag, SVG to OM, SVG to RCA) 2016 in [place]: Abnormal Myoview, described recent symptom was burning sensation. Given abnormal Myoview, I did recommend addition of Imdur, she was not  interested. May try it in the hospital under observation to make sure she does not have any side effect.  3. Hypertension: Hypertensive today, instructed her to take an additional 80 mg of losartan, she will need to permanently increase her valsartan to 160 mg twice a day.  4. Hyperlipidemia: Intolerant to statins, may need to consider PCSK 9 inhibitor, however patient was not interested  5. DM 2: She does not appears to be on any diabetes medication. Previous hemoglobin A1C in July 2016 was 8.7.    Medication Adjustments/Labs and Tests Ordered: Current medicines are reviewed at length with the patient today.  Concerns regarding medicines are outlined above.  Medication changes, Labs and Tests ordered today are listed in the Patient Instructions below. Patient Instructions  Medication Instructions: No changes   Follow-Up: We will call you with an update and instructions for the catheterization after speaking with Dr.[name].    Any Additional Special Instructions Will Be Listed Below (If Applicable).  If you experience any chest pain, please seek immediate medical attention or call 911.            Hilbert Corrigan, [place]  02/25/2017 7:23 PM    [facility] [address], [place], [place]  [address] Phone: [phone]; Fax: [phone]

## 2017-03-01 NOTE — Progress Notes (Signed)
Patient refused regular nsulin coverage ,cbg before meal 187.  Importance of med explained to pt.

## 2017-03-02 ENCOUNTER — Encounter (HOSPITAL_COMMUNITY): Payer: Self-pay | Admitting: Cardiovascular Disease

## 2017-03-02 DIAGNOSIS — R9439 Abnormal result of other cardiovascular function study: Secondary | ICD-10-CM

## 2017-03-02 DIAGNOSIS — I25710 Atherosclerosis of autologous vein coronary artery bypass graft(s) with unstable angina pectoris: Secondary | ICD-10-CM | POA: Diagnosis not present

## 2017-03-02 DIAGNOSIS — I2582 Chronic total occlusion of coronary artery: Secondary | ICD-10-CM | POA: Diagnosis not present

## 2017-03-02 DIAGNOSIS — T82855A Stenosis of coronary artery stent, initial encounter: Secondary | ICD-10-CM | POA: Diagnosis not present

## 2017-03-02 DIAGNOSIS — I10 Essential (primary) hypertension: Secondary | ICD-10-CM | POA: Diagnosis not present

## 2017-03-02 LAB — GLUCOSE, CAPILLARY
GLUCOSE-CAPILLARY: 160 mg/dL — AB (ref 65–99)
GLUCOSE-CAPILLARY: 159 mg/dL — AB (ref 65–99)

## 2017-03-02 LAB — BASIC METABOLIC PANEL
ANION GAP: 8 (ref 5–15)
BUN: 19 mg/dL (ref 6–20)
CO2: 25 mmol/L (ref 22–32)
Calcium: 8.9 mg/dL (ref 8.9–10.3)
Chloride: 108 mmol/L (ref 101–111)
Creatinine, Ser: 0.92 mg/dL (ref 0.44–1.00)
GLUCOSE: 166 mg/dL — AB (ref 65–99)
POTASSIUM: 3.5 mmol/L (ref 3.5–5.1)
Sodium: 141 mmol/L (ref 135–145)

## 2017-03-02 LAB — CBC
HEMATOCRIT: 41.8 % (ref 36.0–46.0)
HEMOGLOBIN: 13.6 g/dL (ref 12.0–15.0)
MCH: 29.5 pg (ref 26.0–34.0)
MCHC: 32.5 g/dL (ref 30.0–36.0)
MCV: 90.7 fL (ref 78.0–100.0)
Platelets: 199 10*3/uL (ref 150–400)
RBC: 4.61 MIL/uL (ref 3.87–5.11)
RDW: 13.2 % (ref 11.5–15.5)
WBC: 9.7 10*3/uL (ref 4.0–10.5)

## 2017-03-02 LAB — TROPONIN I: TROPONIN I: 1.16 ng/mL — AB (ref <0.03)

## 2017-03-02 MED ORDER — PRASUGREL HCL 10 MG PO TABS
30.0000 mg | ORAL_TABLET | Freq: Once | ORAL | Status: AC
  Administered 2017-03-02: 30 mg via ORAL
  Filled 2017-03-02: qty 3

## 2017-03-02 MED ORDER — PRASUGREL HCL 10 MG PO TABS: 10.0000 mg | ORAL_TABLET | Freq: Every day | ORAL | 3 refills | Status: DC

## 2017-03-02 MED ORDER — METOPROLOL TARTRATE 25 MG PO TABS
100.0000 mg | ORAL_TABLET | Freq: Two times a day (BID) | ORAL | Status: DC
  Administered 2017-03-02: 09:00:00 77.5 mg via ORAL
  Filled 2017-03-02: qty 4

## 2017-03-02 MED ORDER — PRASUGREL HCL 10 MG PO TABS: 10.0000 mg | ORAL_TABLET | Freq: Every day | ORAL | Status: DC

## 2017-03-02 MED ORDER — NITROGLYCERIN 0.4 MG SL SUBL
SUBLINGUAL_TABLET | SUBLINGUAL | Status: AC
  Filled 2017-03-02: qty 1

## 2017-03-02 MED ORDER — ISOSORBIDE MONONITRATE ER 30 MG PO TB24: 15.0000 mg | ORAL_TABLET | Freq: Every day | ORAL | 0 refills | Status: DC

## 2017-03-02 MED ORDER — IRBESARTAN 75 MG PO TABS
150.0000 mg | ORAL_TABLET | Freq: Two times a day (BID) | ORAL | Status: DC
  Administered 2017-03-02: 09:00:00 150 mg via ORAL
  Filled 2017-03-02 (×2): qty 2

## 2017-03-02 MED ORDER — ANGIOPLASTY BOOK
Freq: Once | Status: DC
  Filled 2017-03-02: qty 1

## 2017-03-02 MED ORDER — PRASUGREL HCL 10 MG PO TABS: 10.0000 mg | ORAL_TABLET | Freq: Every day | ORAL | 0 refills | Status: DC

## 2017-03-02 NOTE — Progress Notes (Signed)
Site area: R groin  Site Prior to Removal:  Level 0  Pressure Applied For 35 MINUTES    Minutes Beginning at 1950  Manual: :  Yes  Patient Status During Pull: Stable  Post Pull Groin Site:  Level 0  Post Pull Instructions Given:    Post Pull Pulses Present:  Yes  Dressing Applied: Pressure dressing Pt had an issue with oozing.  Comments:  Pt tolerated procedure well. VSS.  Reinforced  Keeping R leg straight.

## 2017-03-02 NOTE — Progress Notes (Addendum)
CARDIAC REHAB PHASE I   PRE:  Rate/Rhythm: 73 SR  BP:  Sitting: 215/79    MODE:  Ambulation: 350 ft   POST:  Rate/Rhythm: 87 SR  BP:  Sitting: 227/94       Pt BP elevated, insistent upon walking, states her blood pressure normally runs very high. Also c/o "gas pain" in her chest. Pt ambulated 350 ft on RA, handheld assist, slow, steady gait, tolerated fairly well.  Pt c/o mild dizziness, feeling "shaky in her legs." BP still elevated upon return to room, RN notified. Completed PCI/stent education.  Reviewed risk factors, anti-platelet therapy, stent card, activity restrictions, ntg, exercise, heart healthy and diabets diet handouts and phase 2 cardiac rehab. Pt verbalized understanding. Pt agrees to phase 2 cardiac rehab referral, will send to [facility]. Pt c/o 4/10 chest pain at rest during education, RN notified. Pt to see case manager regarding brilinta prior to discharge. Pt to bed per pt request after walk, call bell within reach.  [phone] Lenna Sciara, RN, BSN 03/02/2017 8:51 AM

## 2017-03-02 NOTE — Discharge Summary (Signed)
Discharge Summary    Patient ID: Ruth [name],  MRN: [id number], DOB/AGE: May 19, 1946 71 y.o.  Admit date: 03/01/2017 Discharge date: 03/02/2017  Primary Care Provider: Crist Infante Primary Cardiologist: Claiborne Billings  Discharge Diagnoses    Active Problems:   Unstable angina ([place])   Abnormal nuclear stress test   Allergies Allergies  Allergen Reactions  . Betadine [Povidone Iodine] Swelling  . Codeine Anaphylaxis and Other (See Comments)    "quit breathing" (07/28/2012)  . Demerol Other (See Comments)    "quit breathing" (07/28/2012)  . Latex Other (See Comments)    "quit breathing" (07/28/2012)  . Other Other (See Comments)    Perfume, Any Fragrance. "quit breathing" (07/28/2012)  . Percocet [Oxycodone-Acetaminophen] Other (See Comments)    "quit breathing; disoriented" (07/28/2012)  . Plavix [Clopidogrel Bisulfate] Other (See Comments)    "get hot; like I'm burning up inside; had to put me in shower after OHS because of that" (07/28/2012)  . Red Dye Other (See Comments)    "quit breathing" (07/28/2012)  . Shellfish Allergy Shortness Of Breath and Other (See Comments)    "broke out in knots all over" (07/28/2012)  . Sulfonamide Derivatives Rash and Other (See Comments)    "quit breathing" (07/28/2012)  . Tylenol [Acetaminophen] Shortness Of Breath  . Iohexol Swelling  . Statins     Muscle pain    Diagnostic Studies/Procedures    LHC: 03/01/17   Conclusion     Prox Cx to Mid Cx lesion, 100 %stenosed.  1st Mrg lesion, 100 %stenosed.  Ost LAD to Prox LAD lesion, 70 %stenosed.  Prox RCA lesion, 75 %stenosed.  Mid RCA lesion, 100 %stenosed.  SVG.  Prox Graft lesion, 60 %stenosed.  LIMA.  SVG.  Dist Graft lesion, 95 %stenosed.  A STENT SYNERGY DES 3.5X32 drug eluting stent was successfully placed.  Mid Graft lesion, 60 %stenosed.  Post intervention, there is a 0% residual stenosis.  The left ventricular systolic function is normal.  LV end  diastolic pressure is normal.  The left ventricular ejection fraction is 55-65% by visual estimate.   Preserved global LV contractility with an ejection fraction of  55-65%  Significant multivessel native CAD with 70% diffuse proximal LAD stenosis, total occlusion of the circumflex and OM1 vessel proximally, and 75% proximal RCA stenosis with occlusion of the RCA prior to the acute margin.  Patent LIMA graft which supplies the mid LAD and may also anastomose into the diagonal vessel.  SVG supplying the distal marginal branch of the circumflex vessel with smooth 60% proximal to mid body stenosis and TIMI-3 flow.  SVG supplying the distal RCA with previously noted stented segment with focal 60% stenosis followed by 95-99% thrombotic stenosis beyond the stented segment.  Successful PCI to the SVG to the RCA treated with PTCA and stenting with a 3.532 mm Synergy stent postdilated to 3.71 mm with a Sport and exercise psychologist wire for distal graft protection from embolization.  RECOMMENDATION: The patient should continue dual antiplatelet therapy indefinitely.  Medical therapy will need to be adjusted.  The patient has been resistant to take medicines in the past.  She will also be rechallenged with low dose statin therapy.   _____________   History of Present Illness     71 y.o. female with PMH of CAD s/p CABG x 4 (LIMA to LAD, SVG to diag, SVG to OM, SVG to RCA) 2016 in [place], hyperlipidemia, hypertension and DM 2. She had DES to mid SVG to PDA/PLA in  November 2013. Last Myoview obtained in July 2016 showed EF 65%, small defect of mild severity in basal inferolateral wall, overall considered low risk finding. Last echocardiogram obtained on 03/13/2015 showed EF 55-60%, mild MR, PA peak pressure 32 mmHg. She is intolerant to statins but have never tried Zetia. Dr. Claiborne Billings did discuss with her regarding Repatha, however she was not interested. She was last seen by Dr. Claiborne Billings on 12/17/2016,  her blood pressure was very labile and elevated on that day. Her valsartan was increased to 160 mg twice a day.  She was seen by Almyra Deforest on 02/15/2017 at which time she was complaining of some gas pain. Her symptom occurs when she swallows, however also occurs when she pushed a lawnmower. The exertional nature of the symptom was quite concerning. Recommended a stress test, she underwent a stress test 02/25/17, she had hypertensive response, however Myoview portion was significantly abnormal with medium defect of moderate severity present in the basal inferior, basal inferolateral, mid inferior, apical inferior, apical lateral location consistent with his inferolateral ischemia. Ejection fraction was calculated to be 55%. Discussed various options with the patient, she was was still having chest discomfort. Images with DOD Dr. Oval Linsey and cath was discussed with the patient. She wished to hear from Dr. Claiborne Billings before deciding on the procedure. Recommended at addition of Imdur, however she did not wish to try any new medication at this time. Her blood pressure was elevated at this office visit, and was asked to increase her valsartan to 182m BID. Case was discussed with Dr. KClaiborne Billingsand she was referred for cath.   Hospital Course     Underwent LHC with Dr. KClaiborne Billingson 03/01/17 noted above with successful PCI to SVG-->RCA treated with PTCA/DES x1. Was originally placed on Brilinta post cath, but reported she experiencing severe shortness of breath and unable to tolerate. Allergic to plavix, therefore placed on Effient and loaded with 376mprior to discharge. Plan for Effient 1059maily. Originally placed on statin, but intolerant in the past. Consider referral to the lipid clinic at follow up appt. She was restarted on her home medications with improvement in her blood pressure prior to discharge. Troponin noted with flat low trend. Was able to work with cardiac rehab without significant issues.   She was seen by Dr.  CooBurt Knackd determined stable for discharge home. Follow up in the office has been arranged. Medications are listed below.  _____________  Discharge Vitals Blood pressure 133/60, pulse 62, temperature 98 F (36.7 C), temperature source Oral, resp. rate 15, height 5' 3.5" (1.613 m), weight 169 lb 12.1 oz (77 kg), SpO2 97 %.  Filed Weights   03/01/17 1006 03/02/17 0415  Weight: 173 lb (78.5 kg) 169 lb 12.1 oz (77 kg)    Labs & Radiologic Studies    CBC  Recent Labs  03/02/17 0504  WBC 9.7  HGB 13.6  HCT 41.8  MCV 90.7  PLT 199209Basic Metabolic Panel  Recent Labs  03/02/17 0504  NA 141  K 3.5  CL 108  CO2 25  GLUCOSE 166*  BUN 19  CREATININE 0.92  CALCIUM 8.9   Liver Function Tests No results for input(s): AST, ALT, ALKPHOS, BILITOT, PROT, ALBUMIN in the last 72 hours. No results for input(s): LIPASE, AMYLASE in the last 72 hours. Cardiac Enzymes  Recent Labs  03/01/17 1746 03/02/17 0923  TROPONINI 1.07* 1.16*   BNP Invalid input(s): POCBNP D-Dimer No results for input(s): DDIMER in the last  72 hours. Hemoglobin A1C No results for input(s): HGBA1C in the last 72 hours. Fasting Lipid Panel No results for input(s): CHOL, HDL, LDLCALC, TRIG, CHOLHDL, LDLDIRECT in the last 72 hours. Thyroid Function Tests No results for input(s): TSH, T4TOTAL, T3FREE, THYROIDAB in the last 72 hours.  Invalid input(s): FREET3 _____________  US Abdomen Limited Ruq  Result Date: 02/19/2017 CLINICAL DATA:  Right upper quadrant pain 2 weeks EXAM: ULTRASOUND ABDOMEN LIMITED RIGHT UPPER QUADRANT COMPARISON:  01/02/2015 FINDINGS: Gallbladder: Gallbladder is well distended. Single 3 mm polyp is noted. No wall thickening is seen. Common bile duct: Diameter: 3 mm Liver: Increased echogenicity without focal mass. Hepatopetal flow is noted in the portal vein. IMPRESSION: Fatty infiltration of the liver. Small gallbladder polyp. Electronically Signed   By: Inez Catalina M.D.   On: 02/19/2017  11:23   Disposition   Pt is being discharged home today in good condition.  Follow-up Plans & Appointments    Follow-up Information    Almyra Deforest, [place] Follow up on 03/12/2017.   Specialties:  Cardiology, Radiology Why:  at 830am for your follow up appt.  Contact information: [address] [place] [address] [phone]          Discharge Instructions    Amb Referral to Cardiac Rehabilitation    Complete by:  As directed    Diagnosis:  Coronary Stents   Call MD for:  redness, tenderness, or signs of infection (pain, swelling, redness, odor or green/yellow discharge around incision site)    Complete by:  As directed    Diet - low sodium heart healthy    Complete by:  As directed    Discharge instructions    Complete by:  As directed    Groin Site Care Refer to this sheet in the next few weeks. These instructions provide you with information on caring for yourself after your procedure. Your caregiver may also give you more specific instructions. Your treatment has been planned according to current medical practices, but problems sometimes occur. Call your caregiver if you have any problems or questions after your procedure. HOME CARE INSTRUCTIONS You may shower 24 hours after the procedure. Remove the bandage (dressing) and gently wash the site with plain soap and water. Gently pat the site dry.  Do not apply powder or lotion to the site.  Do not sit in a bathtub, swimming pool, or whirlpool for 5 to 7 days.  No bending, squatting, or lifting anything over 10 pounds (4.5 kg) as directed by your caregiver.  Inspect the site at least twice daily.  Do not drive home if you are discharged the same day of the procedure. Have someone else drive you.  You may drive 24 hours after the procedure unless otherwise instructed by your caregiver.  What to expect: Any bruising will usually fade within 1 to 2 weeks.  Blood that collects in the tissue (hematoma) may be painful  to the touch. It should usually decrease in size and tenderness within 1 to 2 weeks.  SEEK IMMEDIATE MEDICAL CARE IF: You have unusual pain at the groin site or down the affected leg.  You have redness, warmth, swelling, or pain at the groin site.  You have drainage (other than a small amount of blood on the dressing).  You have chills.  You have a fever or persistent symptoms for more than 72 hours.  You have a fever and your symptoms suddenly get worse.  Your leg becomes pale, cool, tingly, or  numb.  You have heavy bleeding from the site. Hold pressure on the site. Marland Kitchen  PLEASE DO NOT MISS ANY DOSES OF YOUR EFFIENT!!!   Increase activity slowly    Complete by:  As directed       Discharge Medications   Current Discharge Medication List    START taking these medications   Details  isosorbide mononitrate (IMDUR) 30 MG 24 hr tablet Take 0.5 tablets (15 mg total) by mouth daily. Qty: 60 tablet, Refills: 0    prasugrel (EFFIENT) 10 MG TABS tablet Take 1 tablet (10 mg total) by mouth daily. Qty: 90 tablet, Refills: 3      CONTINUE these medications which have NOT CHANGED   Details  Biotin 1000 MCG tablet Take 500 mcg by mouth daily.    calcium carbonate (TUMS - DOSED IN MG ELEMENTAL CALCIUM) 500 MG chewable tablet Chew 1-2 tablets by mouth 3 (three) times daily as needed for heartburn.     cholecalciferol (VITAMIN D) 1000 UNITS tablet Take 1,000 Units by mouth daily.    Cranberry-Vitamin C-Vitamin E (CRANBERRY PLUS VITAMIN C) 4200-20-3 MG-MG-UNIT CAPS Take 1 capsule by mouth daily.    metoprolol (LOPRESSOR) 100 MG tablet take 1 tablet by mouth twice a day Qty: 60 tablet, Refills: 6    nitroGLYCERIN (NITROSTAT) 0.4 MG SL tablet Place 1 tablet (0.4 mg total) under the tongue every 5 (five) minutes as needed for chest pain (up to 3 doses). Qty: 25 tablet, Refills: 4    ranitidine (ZANTAC) 75 MG tablet Take 150 mg by mouth 2 (two) times daily as needed for heartburn (gas).      Simethicone (GAS-X PO) Take 1 tablet by mouth 2 (two) times daily.    valsartan (DIOVAN) 160 MG tablet Take 160 mg by mouth 2 (two) times daily.     vitamin B-12 (CYANOCOBALAMIN) 1000 MCG tablet Take 1,000 mcg by mouth daily.      STOP taking these medications     aspirin EC 325 MG tablet      aspirin EC 81 MG tablet      Magnesium 250 MG TABS      predniSONE (DELTASONE) 50 MG tablet          Aspirin prescribed at discharge?  No: Reported allergy High Intensity Statin Prescribed? (Lipitor 40-81m or Crestor 20-486m: No: Intolerant Beta Blocker Prescribed? Yes For EF <40%, was ACEI/ARB Prescribed? Yes ADP Receptor Inhibitor Prescribed? (i.e. Plavix etc.-Includes Medically Managed Patients): Yes For EF <40%, Aldosterone Inhibitor Prescribed? No: EF ok Was EF assessed during THIS hospitalization? Yes Was Cardiac Rehab II ordered? (Included Medically managed Patients): Yes   Outstanding Labs/Studies   Consider lipid clinic referral given statin intolerance.   Duration of Discharge Encounter   Greater than 30 minutes including physician time.  Signed, LiReino BellisP-C 03/02/2017, 12:36 PM  Patient seen, examined. Available data reviewed. Agree with findings, assessment, and plan as outlined by LiReino BellisNP-C. The patient is independently interviewed and examined this morning. She is currently stable with no symptoms. She has a fairly complex history including significant problems with medication intolerances. Her blood pressure is currently greater than 200. [facility]. On exam, she is alert and oriented in no distress, her lung fields are clear, JVP is normal, heart is regular rate and rhythm with a grade 2/6 systolic murmur at the right upper sternal border, abdomen is soft and nontender,  her right groin is dressed it is nontender and the dressing is dry. There is no hematoma  present. There is no peripheral edema. Her skin is warm and dry.  I have reviewed the cardiac catheterization report from yesterday that demonstrated critical saphenous vein graft stenosis treated with a drug-eluting stent. The procedure was uncomplicated with the exception of an elevated troponin from 5:46 PM yesterday evening. The patient denies chest pain overnight.  Plan:  Resume metoprolol 100 mg twice a day and valsartan 160 mg twice a day. Monitor blood pressure closely if back in reasonable range patient could be discharged this afternoon  Repeat a troponin to make sure that it is not trending up ordered. The patient does not have active chest pain  Hold atorvastatin. The patient states she couldn't walk when she has taken statin drugs in the past. She is very reluctant to use this. Referred to lipid clinic for consideration of a PCSK9 inhibitor  Patient is short of breath with brilinta. She has multiple medicine intolerances. She is allergic to aspirin. She states that she tolerated Effient well after her last PCI and would like to use this if possible. We'll transition her from brilinta to Effient with a 30 mg loading dose today followed by 10 mg daily tomorrow  Discharge the sac after noon if troponin is flat, patient is chest pain-free, and blood pressure is controlled  Sherren Mocha, M.D. 03/02/2017 12:36 PM

## 2017-03-02 NOTE — Progress Notes (Signed)
Benefit check.  Magda Paganini, Natasa Stigall, RN        Prasugrel (EFFIENT) tablet 31m QD does not need prior authorization. Drug is covered and is a $25 copay for a 30 day supply. Patient may use [facility] which is pharmacy listed that patient uses.   Thanks,   [place]

## 2017-03-04 ENCOUNTER — Telehealth: Payer: Self-pay

## 2017-03-04 NOTE — Telephone Encounter (Signed)
Patient contacted regarding discharge from [facility] 03/02/2017.  Patient understands to follow up with provider Almyra Deforest 03/12/2017 @ 0830 at [facility] office.  Patient understands discharge instructions? Yes  Patient understands medications and regiment? Yes  Patient understands to bring all medications to this visit? yes  Pt states she has been feeling pretty good.  States that her new medication Imdur is giving her horrible headaches and she asked if she needed to be on this medication.  Informed Pt I would ask her cardiologist about it and get back to her.  Pt also states her son who has similar allergies to her has been able to tolerate pravastatin and she asked if she could try this statin.  Will ask about this as well.  Pt states she has removed her bandage to her cath site and it is healing well.  She had a little bleeding upon removal of bandage, but away from cath site.  She states this has resolved.  Pt states she is tolerating Effient.  Will forward questions to cardiologist.

## 2017-03-04 NOTE — Telephone Encounter (Signed)
-----   Message from Cheryln Manly, NP sent at 03/02/2017 11:36 AM EDT ----- Regarding: TOC f/u Needs TOC f/u call  Thx Reino Bellis

## 2017-03-05 ENCOUNTER — Telehealth: Payer: Self-pay | Admitting: Cardiovascular Disease

## 2017-03-05 ENCOUNTER — Telehealth (HOSPITAL_COMMUNITY): Payer: Self-pay

## 2017-03-05 LAB — HEMOGLOBIN A1C
HEMOGLOBIN A1C: 7.5 % — AB (ref 4.8–5.6)
MEAN PLASMA GLUCOSE: 169 mg/dL

## 2017-03-05 NOTE — Telephone Encounter (Signed)
As discussed, refer to lipid clinic for further adjustment of cholesterol medication given prior side effect with statins. Ok to stop Imdur.

## 2017-03-05 NOTE — Telephone Encounter (Signed)
New message     Pt c/o medication issue:  1. Name of Medication:  isosorbide mononitrate (IMDUR) 30 MG 24 hr tablet Take 0.5 tablets (15 mg total) by mouth daily.     2. How are you currently taking this medication (dosage and times per day)?  As prescribed  3. Are you having a reaction (difficulty breathing--STAT)? no  4. What is your medication issue? Migraines    pravactatin - pt wants you to call a prescription for this medication , her son takes it and she wants to try it.

## 2017-03-05 NOTE — Telephone Encounter (Signed)
F/U Call: patient calling states that her current medication, Isosorbide gives her terrible headaches and neck pain. Patient would like to know if she really needs this medication or if she can take the pravastatin instead.  Patient mentions that she did not take the isosorbide medication this morning and the headache and neck pain are subsiding.

## 2017-03-05 NOTE — Telephone Encounter (Signed)
Duplicate message. 

## 2017-03-05 NOTE — Telephone Encounter (Signed)
Patient insurance is active and benefits verified. Patient has [place] [employer] - $5.00 co-payment, deductible $300/$300 has been met, out of pocket $3600/$440 has been met, no co-insurance, no pre-authorization and no limit on visit. Passport/reference [phone].

## 2017-03-05 NOTE — Telephone Encounter (Signed)
Pt notified she will stop imdur, and will discuss this with Isaac Laud at upcoming appt and will await scheduleing to make appt for lipid clinic

## 2017-03-08 ENCOUNTER — Telehealth: Payer: Self-pay | Admitting: Cardiovascular Disease

## 2017-03-08 NOTE — Telephone Encounter (Signed)
Patient allergic/intolerant to multiple medication. Only 2 new medication Indur and effient.  Any medication can cause GI upset/discomfort but looks like GI issues started before starting Effient  Recommendation:  1. Agree with benadryl OTC recommendation for neck rash. 2. Patient should seek medical attention (urgent care/emergency room) if trouble breathing or more severe reaction noted. 3. Agree with GI follow up 4. Avoid irritant on diet (soda, spices, fried food...) 5. Report if dark tarry stools or blood in stools noted.  Noted recent stent placement on 03/01/17. Recommend to continue taking Effient for now. Patient unable to tolerate Brilinta,  Pavix or Aspirin.

## 2017-03-08 NOTE — Telephone Encounter (Signed)
New Message    Pt c/o medication issue:  1. Name of Medication:  prasugrel (EFFIENT) 10 MG TABS tablet Take 1 tablet (10 mg total) by mouth daily.     2. How are you currently taking this medication (dosage and times per day)?  1 a day  3. Are you having a reaction (difficulty breathing--STAT)? Has a rash all over   4. What is your medication issue? Rash all over and gas , and bloated

## 2017-03-08 NOTE — Telephone Encounter (Signed)
Patient was started on Effient 03/01/17 -- didn't seem to bother her Patient was started on Imdur and had a migraine - now off this  She states she now has stomach swelling/gas/hurts Before heart cath, the bloating went up into her chest and she "felt like she was dying" She states she still has this, but it isn't that bad She states her stomach is really blown up She has had BMs -- she thinks she needs a GI doctor -- she takes gas-x & tums  She has a red rash under her neck and it itches - head "kind of itches" too for the past hour or two  She wants to know if she can get real bad gas from any of her medications She states she has allergies to lots of medications  She has no breathing troubles Advised to try benadryl PRN  Will defer to pharmacy staff for med review/side effects

## 2017-03-09 NOTE — Telephone Encounter (Signed)
Returned call to patient She states her rash & itching has improved -- she put alcohol on it last night -- did not use benadryl She states her stomach is the same - blown up with gas -- informed her of recommendationto see GI appt -- educated on foods/beverages to avoid Provided education on all recommendations per [facility]  ** She inquired about the class of medication of Effient - explained this is antiplatelet. She asked if she needed to take aspirin with this. She states she had previously taken ASA 360m QD and then ASA 868mQD but was told to stop at the hospital. Her discharge summary states she is allergic to ASA but cath notes says she needs dual antiplatelet therapy indefinitely and patient states she is NOT allergic to ASA. Explained if she is to be on dual antiplatelet therapy, this would mean ASA 813m effient. She would like clarification from MD on this.   She has an appt with [facility] [place] 7/6  Will defer to MD + PA for clarification on medications.

## 2017-03-09 NOTE — Telephone Encounter (Signed)
I have called the patient. She is compliant on effient, but her ASA was discontinued in the hospital. Given new stent, I have asked her to restart 30m aspirin. Although ASA previously listed as an allergy, she denies this allergy, she actually was taking daily ASA prior to recent cath. Imdur recently stopped due to migraine. She says she has a lot of gas and feels bloated, she likely will need GI eval as well. I will see her this Friday

## 2017-03-12 ENCOUNTER — Other Ambulatory Visit: Payer: Self-pay | Admitting: Physician Assistant

## 2017-03-12 ENCOUNTER — Encounter: Payer: Self-pay | Admitting: Physician Assistant

## 2017-03-12 ENCOUNTER — Ambulatory Visit (INDEPENDENT_AMBULATORY_CARE_PROVIDER_SITE_OTHER): Payer: Medicare Other | Admitting: Physician Assistant

## 2017-03-12 VITALS — BP 136/82 | HR 58 | Ht 63.5 in | Wt 173.0 lb

## 2017-03-12 DIAGNOSIS — I25810 Atherosclerosis of coronary artery bypass graft(s) without angina pectoris: Secondary | ICD-10-CM | POA: Diagnosis not present

## 2017-03-12 DIAGNOSIS — E119 Type 2 diabetes mellitus without complications: Secondary | ICD-10-CM

## 2017-03-12 DIAGNOSIS — I10 Essential (primary) hypertension: Secondary | ICD-10-CM

## 2017-03-12 DIAGNOSIS — E785 Hyperlipidemia, unspecified: Secondary | ICD-10-CM

## 2017-03-12 DIAGNOSIS — Z79899 Other long term (current) drug therapy: Secondary | ICD-10-CM

## 2017-03-12 DIAGNOSIS — Z9889 Other specified postprocedural states: Secondary | ICD-10-CM

## 2017-03-12 MED ORDER — PRAVASTATIN SODIUM 20 MG PO TABS: 20.0000 mg | ORAL_TABLET | Freq: Every evening | ORAL | 3 refills | Status: DC

## 2017-03-12 NOTE — Patient Instructions (Signed)
Your physician has recommended you make the following change in your medication:  -- START pravastatin 99m once daily  Your physician recommends that you return for lab work in 6-8 weeks -- you will need to be fasting (nothing to eat or drink after midnight) -- please come to our office ([facility])  -- no appointment needed  Your physician recommends that you schedule a follow-up appointment in: 2-3 months with Dr. KClaiborne Billings

## 2017-03-12 NOTE — Progress Notes (Addendum)
Cardiology Office Note    Date:  03/13/2017   ID:  Ruth [name], DOB [birth year], MRN [id number]  PCP:  Crist Infante, MD  Cardiologist:  Dr. Claiborne Billings   Chief Complaint  Patient presents with  . Transitions Of Care    seen for Dr. Claiborne Billings, 14 day TCM appt    History of Present Illness:  Ruth [name] is a 71 y.o. female with PMH of CAD s/p CABG x 4 (LIMA to LAD, SVG to diag, SVG to OM, SVG to RCA) 2016 in [place], hyperlipidemia, hypertension and DM 2. She had DES to mid SVG to PDA/PLA in November 2013. Last Myoview obtained in July 2016 showed EF 65%, small defect of mild severity in basal inferolateral wall, overall considered low risk finding. Last echocardiogram obtained on 03/13/2015 showed EF 55-60%, mild MR, PA peak pressure 32 mmHg. She is intolerant to statins but have never tried Zetia. Dr. Claiborne Billings did discuss with her regarding Repatha, however she was not interested. She was last seen by Dr. Claiborne Billings on 12/17/2016, her blood pressure was very labile and elevated on that day. Her valsartan was increased to 160 mg twice a day.  I recently saw the patient at which time she complained of some gas pain in the chest. I recommended a stress test which came back abnormal showing median defect of moderate severity present in the basal inferior, basal inferolateral, mid inferior, apical inferior, apical lateral location consistent with ischemia. She underwent cardiac catheterization which showed multivessel CAD with 70% diffuse proximal LAD stenosis, total occlusion of left circumflex and OM1, 75% proximal RCA stenosis with occlusion of RCA prior to acute marginal. Patent LIMA to mid LAD, 60% stenosis in mid SVG to distal marginal branch of left circumflex, 60% stenosis in SVG to distal RCA followed by 95-99% thrombotic stenosis, this was treated with 3.5 x 15 mm Synergy DES postdilated to 3.71 mm. Imdur was added in the hospital as well, however she could not tolerate it due to headache. When I  spoke with her on 03/08/2017, it turned out her aspirin was discontinued in the hospital. Although aspirin previously listed as allergy, she actually have been taking aspirin without any significant side effect. Therefore I have restarted baby aspirin along with Effient.  She presents today for follow-up, she denies any chest pain. She still have gas pain and feel bloated all the time. She has upcoming visit with her GI doctor. Her blood pressure is better now. She also has upcoming visit was lipid clinic next Monday, however she says she wished to hold it off, she is willing to try Pravachol. I will start her on 20 mg daily Pravachol. If she is able to tolerate this dose, I plan to repeat a fasting lipid panel and LFTs in 6-8 weeks. She can follow-up with Dr. Claiborne Billings in 2-3 month period   Past Medical History:  Diagnosis Date  . Anxiety   . CAD (coronary artery disease)    a. CABG x 4 in 2006 in [place] (LIMA->LAD, VG->Diag, VG->OM, VG->RCA); b. DES to midbody of SVG-PDA/PLA 07/2012; c. 03/2015 Myoview: EF 65%, small defect of mild severit in basal inferolateral wall, low risk.  . Complication of anesthesia    "quit breathing when I got ?Inovar" (07/28/2012)  . Depression   . Diverticulosis   . Dyslipidemia    Intolerant of statins  . Fecal incontinence   . GERD (gastroesophageal reflux disease)   . Hyperlipidemia   . Hyperplastic colon  polyp   . IBS (irritable bowel syndrome)   . Labile hypertension    a. 09/2016 Renal Artery duplex: nl renal arteries.  . Migraines    "had them in my 30's" (07/28/2012)  . Multiple allergies   . Obesity   . Steatohepatitis   . Type II diabetes mellitus (HCC)    a. no meds, diet controlled - takes cinnamon.    Past Surgical History:  Procedure Laterality Date  . ABDOMINAL HYSTERECTOMY  1970's  . BREAST LUMPECTOMY     bilateral  . CARDIAC CATHETERIZATION  2006  . CORONARY ANGIOPLASTY WITH STENT PLACEMENT  07/28/2012   "1; first time for me"  (07/28/2012)  . CORONARY ARTERY BYPASS GRAFT  2006   CABG X4  . CORONARY STENT INTERVENTION N/A 03/01/2017   Procedure: Coronary Stent Intervention;  Surgeon: Troy Sine, MD;  Location: [facility];  Service: Cardiovascular;  Laterality: N/A;  . EXCISIONAL HEMORRHOIDECTOMY  1970's  . INGUINAL HERNIA REPAIR  1970's?   left  . LEFT HEART CATH AND CORS/GRAFTS ANGIOGRAPHY N/A 03/01/2017   Procedure: Left Heart Cath and Cors/Grafts Angiography;  Surgeon: Troy Sine, MD;  Location: [facility];  Service: Cardiovascular;  Laterality: N/A;  . LEFT HEART CATHETERIZATION WITH CORONARY/GRAFT ANGIOGRAM N/A 07/28/2012   Procedure: LEFT HEART CATHETERIZATION WITH Beatrix Fetters;  Surgeon: Burnell Blanks, MD;  Location: [facility];  Service: Cardiovascular;  Laterality: N/A;  . PERCUTANEOUS CORONARY STENT INTERVENTION (PCI-S)  07/28/2012   Procedure: PERCUTANEOUS CORONARY STENT INTERVENTION (PCI-S);  Surgeon: Burnell Blanks, MD;  Location: [facility] CATH LAB;  Service: Cardiovascular;;  . TONSILLECTOMY AND ADENOIDECTOMY  ~ 1951    Current Medications: Outpatient Medications Prior to Visit  Medication Sig Dispense Refill  . Biotin 1000 MCG tablet Take 500 mcg by mouth daily.    . calcium carbonate (TUMS - DOSED IN MG ELEMENTAL CALCIUM) 500 MG chewable tablet Chew 1-2 tablets by mouth 3 (three) times daily as needed for heartburn.     . cholecalciferol (VITAMIN D) 1000 UNITS tablet Take 1,000 Units by mouth daily.    . Cranberry-Vitamin C-Vitamin E (CRANBERRY PLUS VITAMIN C) 4200-20-3 MG-MG-UNIT CAPS Take 1 capsule by mouth daily.    . isosorbide mononitrate (IMDUR) 30 MG 24 hr tablet Take 0.5 tablets (15 mg total) by mouth daily. 60 tablet 0  . metoprolol (LOPRESSOR) 100 MG tablet take 1 tablet by mouth twice a day 60 tablet 6  . nitroGLYCERIN (NITROSTAT) 0.4 MG SL tablet Place 1 tablet (0.4 mg total) under the tongue every 5 (five) minutes as needed for chest pain  (up to 3 doses). 25 tablet 4  . prasugrel (EFFIENT) 10 MG TABS tablet Take 1 tablet (10 mg total) by mouth daily. 30 tablet 0  . ranitidine (ZANTAC) 75 MG tablet Take 150 mg by mouth 2 (two) times daily as needed for heartburn (gas).    . Simethicone (GAS-X PO) Take 1 tablet by mouth 2 (two) times daily.    . valsartan (DIOVAN) 160 MG tablet Take 160 mg by mouth 2 (two) times daily.     . vitamin B-12 (CYANOCOBALAMIN) 1000 MCG tablet Take 1,000 mcg by mouth daily.     No facility-administered medications prior to visit.      Allergies:   Betadine [povidone iodine]; Codeine; Demerol; Latex; Other; Percocet [oxycodone-acetaminophen]; Plavix [clopidogrel bisulfate]; Red dye; Shellfish allergy; Sulfonamide derivatives; Tylenol [acetaminophen]; Iohexol; and Statins   Social History   Social History  .  Marital status: Widowed    Spouse name: N/A  . Number of children: 2  . Years of education: N/A   Occupational History  . retired    Social History Main Topics  . Smoking status: Never Smoker  . Smokeless tobacco: Never Used  . Alcohol use No  . Drug use: No  . Sexual activity: No   Other Topics Concern  . None   Social History Narrative  . None     Family History:  The patient's family history includes Colon cancer in her father; Colon polyps in her father; Coronary artery disease in her father; Diabetes in her maternal grandmother and paternal grandmother; Heart disease in her father; Stroke in her mother.   ROS:   Please see the history of present illness.    ROS All other systems reviewed and are negative.   PHYSICAL EXAM:   VS:  BP 136/82   Pulse (!) 58   Ht 5' 3.5" (1.613 m)   Wt 173 lb (78.5 kg)   BMI 30.16 kg/m    GEN: Well nourished, well developed, in no acute distress  HEENT: normal  Neck: no JVD, carotid bruits, or masses Cardiac: RRR; no murmurs, rubs, or gallops,no edema  Respiratory:  clear to auscultation bilaterally, normal work of breathing GI: soft,  nontender, nondistended, + BS MS: no deformity or atrophy  Skin: warm and dry, no rash Neuro:  Alert and Oriented x 3, Strength and sensation are intact Psych: euthymic mood, full affect  Wt Readings from Last 3 Encounters:  03/12/17 173 lb (78.5 kg)  03/02/17 169 lb 12.1 oz (77 kg)  02/25/17 173 lb (78.5 kg)      Studies/Labs Reviewed:   EKG:  EKG is ordered today.  The ekg ordered today demonstrates Normal sinus rhythm with T-wave inversions in lead 3 and aVF.  Recent Labs: 12/14/2016: ALT 12 03/02/2017: BUN 19; Creatinine, Ser 0.92; Hemoglobin 13.6; Platelets 199; Potassium 3.5; Sodium 141   Lipid Panel    Component Value Date/Time   CHOL 244 (H) 03/19/2015 0812   TRIG 262 (H) 03/19/2015 0812   HDL 29 (L) 03/19/2015 0812   CHOLHDL 8.4 03/19/2015 0812   VLDL 52 (H) 03/19/2015 0812   LDLCALC 163 (H) 03/19/2015 0812   LDLDIRECT 160.9 06/21/2012 0833    Additional studies/ records that were reviewed today include:   Cath 03/01/2017 Conclusion     Prox Cx to Mid Cx lesion, 100 %stenosed.  1st Mrg lesion, 100 %stenosed.  Ost LAD to Prox LAD lesion, 70 %stenosed.  Prox RCA lesion, 75 %stenosed.  Mid RCA lesion, 100 %stenosed.  SVG.  Prox Graft lesion, 60 %stenosed.  LIMA.  SVG.  Dist Graft lesion, 95 %stenosed.  A STENT SYNERGY DES 3.5X32 drug eluting stent was successfully placed.  Mid Graft lesion, 60 %stenosed.  Post intervention, there is a 0% residual stenosis.  The left ventricular systolic function is normal.  LV end diastolic pressure is normal.  The left ventricular ejection fraction is 55-65% by visual estimate.   Preserved global LV contractility with an ejection fraction of  55-65%  Significant multivessel native CAD with 70% diffuse proximal LAD stenosis, total occlusion of the circumflex and OM1 vessel proximally, and 75% proximal RCA stenosis with occlusion of the RCA prior to the acute margin.  Patent LIMA graft which supplies the  mid LAD and may also anastomose into the diagonal vessel.  SVG supplying the distal marginal branch of the circumflex vessel with smooth 60%  proximal to mid body stenosis and TIMI-3 flow.  SVG supplying the distal RCA with previously noted stented segment with focal 60% stenosis followed by 95-99% thrombotic stenosis beyond the stented segment.  Successful PCI to the SVG to the RCA treated with PTCA and stenting with a 3.532 mm Synergy stent postdilated to 3.71 mm with a Sport and exercise psychologist wire for distal graft protection from embolization.  RECOMMENDATION: The patient should continue dual antiplatelet therapy indefinitely.  Medical therapy will need to be adjusted.  The patient has been resistant to take medicines in the past.  She will also be rechallenged with low dose statin therapy.      ASSESSMENT:    1. Coronary artery disease involving coronary bypass graft of native heart without angina pectoris   2. Hyperlipidemia, unspecified hyperlipidemia type   3. S/P cardiac catheterization   4. Medication management   5. Essential hypertension   6. Controlled type 2 diabetes mellitus without complication, without long-term current use of insulin (HCC)      PLAN:  In order of problems listed above:  1. CAD s/p CABG: Status post recent PCI. Allergic to Plavix, started on aspirin and Effient. Unfortunately, she was not discharged on aspirin, aspirin was restarted on 03/08/2017 after phone conversation. She continued to have gas pain and feel her abdomen bloated, however she has more energy now and denies any significant chest pain. She has upcoming evaluation by GI.  2. Hypertension: Her blood pressure has always been uncontrolled, she was started on Imdur in the hospital, however she was unable to tolerated. Surprisingly today her blood pressure is relatively normal.  3. Hyperlipidemia: She agreed to start on Pravachol 20 mg daily. We'll need fasting lipid panel and LFTs in 6-8  weeks. If cholesterol still not controlled, will refer her to the clinic again.  4. DM 2: Recent hemoglobin A1c 7.5.    Medication Adjustments/Labs and Tests Ordered: Current medicines are reviewed at length with the patient today.  Concerns regarding medicines are outlined above.  Medication changes, Labs and Tests ordered today are listed in the Patient Instructions below. Patient Instructions  Your physician has recommended you make the following change in your medication:  -- START pravastatin 52m once daily  Your physician recommends that you return for lab work in 6-8 weeks -- you will need to be fasting (nothing to eat or drink after midnight) -- please come to our office ([facility])  -- no appointment needed  Your physician recommends that you schedule a follow-up appointment in: 2-3 months with Dr. KClaiborne Billings       Signed, HAlmyra Deforest PUtah 03/13/2017 11:02 AM    [email or web address] [place]  [id number]: [phone] Fax: [phone]

## 2017-03-13 ENCOUNTER — Encounter: Payer: Self-pay | Admitting: Physician Assistant

## 2017-03-13 NOTE — Addendum Note (Signed)
Addended byEulas Post, Jeb Schloemer on: 03/13/2017 11:02 AM   Modules accepted: Level of Service

## 2017-03-15 ENCOUNTER — Telehealth: Payer: Self-pay | Admitting: Cardiovascular Disease

## 2017-03-15 ENCOUNTER — Ambulatory Visit: Payer: Medicare Other

## 2017-03-15 NOTE — Telephone Encounter (Signed)
Patient calling, states that she was supposed to start rehab program because she had a stent put in recently. Patient is calling for updates.

## 2017-03-15 NOTE — Telephone Encounter (Signed)
Notified pt to wait a week for cardiac rehab to get referral and to call back if they do not call.

## 2017-03-17 ENCOUNTER — Telehealth: Payer: Self-pay | Admitting: Cardiovascular Disease

## 2017-03-17 NOTE — Telephone Encounter (Signed)
Returned call to patient, reports she has not been scheduled for cardiac rehab yet.   Reports she called cardiac rehab and was not given any instruction or reason as to why she wasn't being scheduled.    Per chart review:    General 03/14/2017 12:35 PM Rowe Pavy, RN - -  Note   Clinical review of pt follow up appt on 7/6 office note. Pt to see GI Md for follow up on bloating and gas pain.  Pt appropriate for scheduling for cardiac rehab for after GI appt (hopefully near future).  Will forward to support staff for scheduling. Maurice Small RN, BSN Cardiac and Pulmonary Rehab Nurse Navigator            Patient reports she has GI appt 7/17.  Advised I would reach out to Cardiac Rehab for further direction/advice.  Patient aware and verbalized understanding.

## 2017-03-17 NOTE — Telephone Encounter (Signed)
New message    Pt is calling about a referral for rehab.

## 2017-03-23 ENCOUNTER — Ambulatory Visit (INDEPENDENT_AMBULATORY_CARE_PROVIDER_SITE_OTHER): Payer: Medicare Other | Admitting: Nurse Practitioner

## 2017-03-23 ENCOUNTER — Encounter: Payer: Self-pay | Admitting: Nurse Practitioner

## 2017-03-23 VITALS — BP 172/80 | HR 68 | Ht 63.0 in | Wt 176.0 lb

## 2017-03-23 DIAGNOSIS — R140 Abdominal distension (gaseous): Secondary | ICD-10-CM

## 2017-03-23 DIAGNOSIS — R1010 Upper abdominal pain, unspecified: Secondary | ICD-10-CM | POA: Diagnosis not present

## 2017-03-23 DIAGNOSIS — K5900 Constipation, unspecified: Secondary | ICD-10-CM | POA: Diagnosis not present

## 2017-03-23 NOTE — Progress Notes (Signed)
HPI: Patient is a 71 yo female known to [place] for hx of IBS. I saw her late May and late June for non-exertional, non-radiating chest pain. We increased her zantac to 150 mg BID which helped but didn't alleviate the pain. Cardiology had seen her, agreed the pain seemed more GI. Then she complained of exertional chest pain and got scheduled for a heart cath. I saw her in June prior to cath and chest pain was better after she starting taking a small amount of vinegar every day. She went for the cath and it turned out that she had significant multivessel disease.  I reviewed hospital records. She had PCI to the SVG to the RCA treated with PTCA. On dual antiplatelet therapy now.   Ruth [name] is back. Following PCI her chest pain is better but she still gets burning sometimes. She still complains of excessive belching and she has generalized non-radiating upper abdominal pain. Pain is intermittent but definitely worse after eating. It has awoken her at night but generally doesn't. Pain isn't positional. Gas-x helps the belching. No nausea, her weight is up a few pounds.  She wants an xray to rule out cancer. She hasn't resumed zantac since stent placed. She is on Cipro for UTI.     Past Medical History:  Diagnosis Date  . Anxiety   . CAD (coronary artery disease)    a. CABG x 4 in 2006 in [place] (LIMA->LAD, VG->Diag, VG->OM, VG->RCA); b. DES to midbody of SVG-PDA/PLA 07/2012; c. 03/2015 Myoview: EF 65%, small defect of mild severit in basal inferolateral wall, low risk.  . Complication of anesthesia    "quit breathing when I got ?Inovar" (07/28/2012)  . Depression   . Diverticulosis   . Dyslipidemia    Intolerant of statins  . Fecal incontinence   . GERD (gastroesophageal reflux disease)   . Hyperlipidemia   . Hyperplastic colon polyp   . IBS (irritable bowel syndrome)   . Labile hypertension    a. 09/2016 Renal Artery duplex: nl renal arteries.  . Migraines    "had them in my 30's"  (07/28/2012)  . Multiple allergies   . Obesity   . Steatohepatitis   . Type II diabetes mellitus (HCC)    a. no meds, diet controlled - takes cinnamon.    Patient's surgical history, family medical history, social history, medications and allergies were all reviewed in Epic    Physical Exam: BP (!) 172/80   Pulse 68   Ht 5' 3"  (1.6 m)   Wt 176 lb (79.8 kg)   BMI 31.18 kg/m   GENERAL: overweight white female in NAD PSYCH: :Pleasant, cooperative, normal affect EENT:  conjunctiva pink, mucous membranes moist, neck supple without masses CARDIAC:  RRR, no peripheral edema PULM: Normal respiratory effort, lungs CTA bilaterally, no wheezing ABDOMEN:  soft, nontender, nondistended, no obvious masses, no hepatomegaly,  normal bowel sounds SKIN:  turgor, no lesions seen Musculoskeletal:  Normal muscle tone, normal strength NEURO: Alert and oriented x 3, no focal neurologic deficits   ASSESSMENT and PLAN:  1. Pleasant 71 year old female back with belching, generalized upper abdominal discomfort. She is no longer taking Zantac and has chronic constipation. Her weight is stable. Marland Kitchen She wants imaging to look for cancer.   -Resume Zantac 75 mg BID -treat constipation, see #2.  -Call with a condition update in a couple of weeks. If she has persistent upper abdominal pain despite resolution of constipation and resumption of Zantac  then we will proceed with further workup.  2. Chronic constipation.  -she never did try the glycerin supp I recommended. I advised her to start daily Miralax and use Glycerin supp as needed.   3. CAD, s/p PCI last month. [place] , NP 03/23/2017, 10:40 AM

## 2017-03-23 NOTE — Patient Instructions (Signed)
If you are age 73 or older, your body mass index should be between 23-30. Your Body mass index is 31.18 kg/m. If this is out of the aforementioned range listed, please consider follow up with your Primary Care Provider.  If you are age 25 or younger, your body mass index should be between 19-25. Your Body mass index is 31.18 kg/m. If this is out of the aformentioned range listed, please consider follow up with your Primary Care Provider.   Use Glycerin suppositories as needed.  Start Miralax daily - one capful with 8 ounces of water.  Resume Zantac 75 mg  twice daily (before breakfast and dinner)  Continue Gas X as needed.  Call me in two weeks with an update.  Thank you for choosing me and [facility].   Tye Savoy, NP

## 2017-03-24 ENCOUNTER — Telehealth (HOSPITAL_COMMUNITY): Payer: Self-pay

## 2017-03-24 NOTE — Telephone Encounter (Signed)
*  Updated insurance verification* [employer] Medicare - $5.00 co-pay, deductible $300/$300 has been met, out of pocket $3600/$485.00 has been met, no co-insurance and no pre-authorization. Passport/reference [id number].

## 2017-03-25 ENCOUNTER — Encounter (HOSPITAL_COMMUNITY): Payer: Self-pay

## 2017-03-25 ENCOUNTER — Encounter (HOSPITAL_COMMUNITY)
Admission: RE | Admit: 2017-03-25 | Discharge: 2017-03-25 | Disposition: A | Discharge status: Home / Self Care | Payer: Medicare Other | Source: Ambulatory Visit | Attending: Cardiovascular Disease | Admitting: Cardiovascular Disease

## 2017-03-25 VITALS — BP 148/84 | HR 59 | Ht 62.5 in | Wt 180.0 lb

## 2017-03-25 DIAGNOSIS — Z48812 Encounter for surgical aftercare following surgery on the circulatory system: Secondary | ICD-10-CM | POA: Insufficient documentation

## 2017-03-25 DIAGNOSIS — Z955 Presence of coronary angioplasty implant and graft: Secondary | ICD-10-CM | POA: Insufficient documentation

## 2017-03-25 NOTE — Progress Notes (Signed)
Cardiac Individual Treatment Plan  Patient Details  Name: Ruth [name] MRN: [id number] Date of Birth: 29-Jun-1946 Referring Provider:     [facility] from 03/25/2017 in [place]  Referring Provider  [name] MD      Initial Encounter Date:    CARDIAC REHAB PHASE II ORIENTATION from 03/25/2017 in [place]  Date  03/25/17  Referring Provider  [name] MD      Visit Diagnosis: 03/02/17 Status post coronary artery stent placement  Patient's Home Medications on Admission:  Current Outpatient Prescriptions:  .  aspirin EC 81 MG tablet, Take 81 mg by mouth daily., Disp: , Rfl:  .  Biotin 1000 MCG tablet, Take 500 mcg by mouth daily., Disp: , Rfl:  .  calcium carbonate (TUMS - DOSED IN MG ELEMENTAL CALCIUM) 500 MG chewable tablet, Chew 1-2 tablets by mouth 3 (three) times daily as needed for heartburn. , Disp: , Rfl:  .  cholecalciferol (VITAMIN D) 1000 UNITS tablet, Take 1,000 Units by mouth daily., Disp: , Rfl:  .  ciprofloxacin (CIPRO) 500 MG tablet, Take 500 mg by mouth 2 (two) times daily., Disp: , Rfl: 0 .  Cranberry-Vitamin C-Vitamin E (CRANBERRY PLUS VITAMIN C) 4200-20-3 MG-MG-UNIT CAPS, Take 1 capsule by mouth daily., Disp: , Rfl:  .  metoprolol (LOPRESSOR) 100 MG tablet, take 1 tablet by mouth twice a day, Disp: 60 tablet, Rfl: 6 .  nitroGLYCERIN (NITROSTAT) 0.4 MG SL tablet, Place 1 tablet (0.4 mg total) under the tongue every 5 (five) minutes as needed for chest pain (up to 3 doses)., Disp: 25 tablet, Rfl: 4 .  prasugrel (EFFIENT) 10 MG TABS tablet, Take 1 tablet (10 mg total) by mouth daily., Disp: 30 tablet, Rfl: 0 .  pravastatin (PRAVACHOL) 20 MG tablet, Take 1 tablet (20 mg total) by mouth every evening., Disp: 90 tablet, Rfl: 3 .  ranitidine (ZANTAC) 75 MG tablet, Take 150 mg by mouth 2 (two) times daily as needed for heartburn (gas)., Disp: , Rfl:  .  Simethicone (GAS-X PO),  Take 1 tablet by mouth 2 (two) times daily., Disp: , Rfl:  .  valsartan (DIOVAN) 160 MG tablet, Take 160 mg by mouth 2 (two) times daily. , Disp: , Rfl:  .  vitamin B-12 (CYANOCOBALAMIN) 1000 MCG tablet, Take 1,000 mcg by mouth daily., Disp: , Rfl:  .  isosorbide mononitrate (IMDUR) 30 MG 24 hr tablet, Take 0.5 tablets (15 mg total) by mouth daily. (Patient not taking: Reported on 03/25/2017), Disp: 60 tablet, Rfl: 0  Past Medical History: Past Medical History:  Diagnosis Date  . Anxiety   . CAD (coronary artery disease)    a. CABG x 4 in 2006 in [place] (LIMA->LAD, VG->Diag, VG->OM, VG->RCA); b. DES to midbody of SVG-PDA/PLA 07/2012; c. 03/2015 Myoview: EF 65%, small defect of mild severit in basal inferolateral wall, low risk.  . Complication of anesthesia    "quit breathing when I got ?[place]" (07/28/2012)  . Depression   . Diverticulosis   . Dyslipidemia    Intolerant of statins  . Fecal incontinence   . GERD (gastroesophageal reflux disease)   . Hyperlipidemia   . Hyperplastic colon polyp   . IBS (irritable bowel syndrome)   . Labile hypertension    a. 09/2016 Renal Artery duplex: nl renal arteries.  . Migraines    "had them in my 30's" (07/28/2012)  . Multiple allergies   . Obesity   .  Steatohepatitis   . Type II diabetes mellitus (HCC)    a. no meds, diet controlled - takes cinnamon.    Tobacco Use: History  Smoking Status  . Never Smoker  Smokeless Tobacco  . Never Used    Labs: Recent Review Flowsheet Data    Labs for ITP Cardiac and Pulmonary Rehab Latest Ref Rng & Units 12/15/2012 12/15/2012 02/13/2014 03/19/2015 03/02/2017   Cholestrol 0 - 200 mg/dL - - - 244(H) -   LDLCALC 0 - 99 mg/dL - - - 163(H) -   LDLDIRECT mg/dL - - - - -   HDL >=46 mg/dL - - - 29(L) -   Trlycerides <150 mg/dL - - - 262(H) -   Hemoglobin A1c 4.8 - 5.6 % - - 8.0(H) 8.7(H) 7.5(H)   TCO2 0 - 100 mmol/L 27 27 - - -      Capillary Blood Glucose: Lab Results  Component Value Date    GLUCAP 159 (H) 03/02/2017   GLUCAP 160 (H) 03/02/2017   GLUCAP 152 (H) 03/01/2017   GLUCAP 187 (H) 03/01/2017   GLUCAP 218 (H) 03/01/2017     Exercise Target Goals: Date: 03/25/17  Exercise Program Goal: Individual exercise prescription set with THRR, safety & activity barriers. Participant demonstrates ability to understand and report RPE using BORG scale, to self-measure pulse accurately, and to acknowledge the importance of the exercise prescription.  Exercise Prescription Goal: Starting with aerobic activity 30 plus minutes a day, 3 days per week for initial exercise prescription. Provide home exercise prescription and guidelines that participant acknowledges understanding prior to discharge.  Activity Barriers & Risk Stratification:     Activity Barriers & Cardiac Risk Stratification - 03/25/17 0857      Activity Barriers & Cardiac Risk Stratification   Activity Barriers Arthritis;Deconditioning;Muscular Weakness;Other (comment)   Comments arthritis B ankles   Cardiac Risk Stratification High      6 Minute Walk:     6 Minute Walk    Row Name 03/25/17 1128         6 Minute Walk   Phase Initial     Distance 1600 feet     Walk Time 6 minutes     # of Rest Breaks 0     MPH 3.03     METS 3.3     RPE 11     VO2 Peak 11.55     Symptoms No     Resting HR 59 bpm     Resting BP 148/84     Max Ex. HR 100 bpm     Max Ex. BP 182/84     2 Minute Post BP 162/84        Oxygen Initial Assessment:   Oxygen Re-Evaluation:   Oxygen Discharge (Final Oxygen Re-Evaluation):   Initial Exercise Prescription:     Initial Exercise Prescription - 03/25/17 1100      Date of Initial Exercise RX and Referring Provider   Date 03/25/17   Referring Provider [name] MD     Bike   Level 0.5   Minutes 10   METs 2.42     NuStep   Level 3   SPM 70   Minutes 10   METs 2     Track   Laps 9   Minutes 10   METs 2.57     Prescription Details   Frequency (times  per week) 3   Duration Progress to 30 minutes of continuous aerobic without signs/symptoms of physical distress  Intensity   THRR 40-80% of Max Heartrate 59-118   Ratings of Perceived Exertion 11-15   Perceived Dyspnea 0-4     Progression   Progression Continue to progress workloads to maintain intensity without signs/symptoms of physical distress.     Resistance Training   Training Prescription Yes   Weight 2lbs   Reps 10-15      Perform Capillary Blood Glucose checks as needed.  Exercise Prescription Changes:   Exercise Comments:   Exercise Goals and Review:     Exercise Goals    Row Name 03/25/17 0857             Exercise Goals   Increase Physical Activity Yes       Intervention Provide advice, education, support and counseling about physical activity/exercise needs.;Develop an individualized exercise prescription for aerobic and resistive training based on initial evaluation findings, risk stratification, comorbidities and participant's personal goals.       Expected Outcomes Achievement of increased cardiorespiratory fitness and enhanced flexibility, muscular endurance and strength shown through measurements of functional capacity and personal statement of participant.       Increase Strength and Stamina Yes  return to yardwork; get back to using a push mower       Intervention Provide advice, education, support and counseling about physical activity/exercise needs.;Develop an individualized exercise prescription for aerobic and resistive training based on initial evaluation findings, risk stratification, comorbidities and participant's personal goals.       Expected Outcomes Achievement of increased cardiorespiratory fitness and enhanced flexibility, muscular endurance and strength shown through measurements of functional capacity and personal statement of participant.          Exercise Goals Re-Evaluation :    Discharge Exercise Prescription (Final  Exercise Prescription Changes):   Nutrition:  Target Goals: Understanding of nutrition guidelines, daily intake of sodium <1553m, cholesterol <2020m calories 30% from fat and 7% or less from saturated fats, daily to have 5 or more servings of fruits and vegetables.  Biometrics:     Pre Biometrics - 03/25/17 1138      Pre Biometrics   Waist Circumference 40 inches   Hip Circumference 42 inches   Waist to Hip Ratio 0.95 %   Triceps Skinfold 39 mm   % Body Fat 45.8 %   Grip Strength 31 kg   Flexibility 16.5 in   Single Leg Stand 1.85 seconds       Nutrition Therapy Plan and Nutrition Goals:   Nutrition Discharge: Nutrition Scores:   Nutrition Goals Re-Evaluation:   Nutrition Goals Re-Evaluation:   Nutrition Goals Discharge (Final Nutrition Goals Re-Evaluation):   Psychosocial: Target Goals: Acknowledge presence or absence of significant depression and/or stress, maximize coping skills, provide positive support system. Participant is able to verbalize types and ability to use techniques and skills needed for reducing stress and depression.  Initial Review & Psychosocial Screening:     Initial Psych Review & Screening - 03/25/17 1313      Initial Review   Current issues with None Identified     Family Dynamics   Good Support System? Yes     Barriers   Psychosocial barriers to participate in program There are no identifiable barriers or psychosocial needs.     Screening Interventions   Interventions Encouraged to exercise      Quality of Life Scores:     Quality of Life - 03/25/17 1145      Quality of Life Scores   Health/Function Pre 29.09 %  Socioeconomic Pre 30 %   Psych/Spiritual Pre 28.5 %   Family Pre 28.75 %   GLOBAL Pre 29.1 %      PHQ-9: Recent Review Flowsheet Data    There is no flowsheet data to display.     Interpretation of Total Score  Total Score Depression Severity:  1-4 = Minimal depression, 5-9 = Mild depression, 10-14  = Moderate depression, 15-19 = Moderately severe depression, 20-27 = Severe depression   Psychosocial Evaluation and Intervention:   Psychosocial Re-Evaluation:   Psychosocial Discharge (Final Psychosocial Re-Evaluation):   Vocational Rehabilitation: Provide vocational rehab assistance to qualifying candidates.   Vocational Rehab Evaluation & Intervention:   Education: Education Goals: Education classes will be provided on a weekly basis, covering required topics. Participant will state understanding/return demonstration of topics presented.  Learning Barriers/Preferences:     Learning Barriers/Preferences - 03/25/17 0857      Learning Barriers/Preferences   Learning Barriers Sight   Learning Preferences Skilled Demonstration      Education Topics: Count Your Pulse:  -Group instruction provided by verbal instruction, demonstration, patient participation and written materials to support subject.  Instructors address importance of being able to find your pulse and how to count your pulse when at home without a heart monitor.  Patients get hands on experience counting their pulse with staff help and individually.   Heart Attack, Angina, and Risk Factor Modification:  -Group instruction provided by verbal instruction, video, and written materials to support subject.  Instructors address signs and symptoms of angina and heart attacks.    Also discuss risk factors for heart disease and how to make changes to improve heart health risk factors.   Functional Fitness:  -Group instruction provided by verbal instruction, demonstration, patient participation, and written materials to support subject.  Instructors address safety measures for doing things around the house.  Discuss how to get up and down off the floor, how to pick things up properly, how to safely get out of a chair without assistance, and balance training.   Meditation and Mindfulness:  -Group instruction provided by  verbal instruction, patient participation, and written materials to support subject.  Instructor addresses importance of mindfulness and meditation practice to help reduce stress and improve awareness.  Instructor also leads participants through a meditation exercise.    Stretching for Flexibility and Mobility:  -Group instruction provided by verbal instruction, patient participation, and written materials to support subject.  Instructors lead participants through series of stretches that are designed to increase flexibility thus improving mobility.  These stretches are additional exercise for major muscle groups that are typically performed during regular warm up and cool down.   Hands Only CPR:  -Group verbal, video, and participation provides a basic overview of AHA guidelines for community CPR. Role-play of emergencies allow participants the opportunity to practice calling for help and chest compression technique with discussion of AED use.   Hypertension: -Group verbal and written instruction that provides a basic overview of hypertension including the most recent diagnostic guidelines, risk factor reduction with self-care instructions and medication management.    Nutrition I class: Heart Healthy Eating:  -Group instruction provided by PowerPoint slides, verbal discussion, and written materials to support subject matter. The instructor gives an explanation and review of the Therapeutic Lifestyle Changes diet recommendations, which includes a discussion on lipid goals, dietary fat, sodium, fiber, plant stanol/sterol esters, sugar, and the components of a well-balanced, healthy diet.   Nutrition II class: Lifestyle Skills:  -Group instruction provided  by PowerPoint slides, verbal discussion, and written materials to support subject matter. The instructor gives an explanation and review of label reading, grocery shopping for heart health, heart healthy recipe modifications, and ways to make  healthier choices when eating out.   Diabetes Question & Answer:  -Group instruction provided by PowerPoint slides, verbal discussion, and written materials to support subject matter. The instructor gives an explanation and review of diabetes co-morbidities, pre- and post-prandial blood glucose goals, pre-exercise blood glucose goals, signs, symptoms, and treatment of hypoglycemia and hyperglycemia, and foot care basics.   Diabetes Blitz:  -Group instruction provided by PowerPoint slides, verbal discussion, and written materials to support subject matter. The instructor gives an explanation and review of the physiology behind type 1 and type 2 diabetes, diabetes medications and rational behind using different medications, pre- and post-prandial blood glucose recommendations and Hemoglobin A1c goals, diabetes diet, and exercise including blood glucose guidelines for exercising safely.    Portion Distortion:  -Group instruction provided by PowerPoint slides, verbal discussion, written materials, and food models to support subject matter. The instructor gives an explanation of serving size versus portion size, changes in portions sizes over the last 20 years, and what consists of a serving from each food group.   Stress Management:  -Group instruction provided by verbal instruction, video, and written materials to support subject matter.  Instructors review role of stress in heart disease and how to cope with stress positively.     Exercising on Your Own:  -Group instruction provided by verbal instruction, power point, and written materials to support subject.  Instructors discuss benefits of exercise, components of exercise, frequency and intensity of exercise, and end points for exercise.  Also discuss use of nitroglycerin and activating EMS.  Review options of places to exercise outside of rehab.  Review guidelines for sex with heart disease.   Cardiac Drugs I:  -Group instruction provided by  verbal instruction and written materials to support subject.  Instructor reviews cardiac drug classes: antiplatelets, anticoagulants, beta blockers, and statins.  Instructor discusses reasons, side effects, and lifestyle considerations for each drug class.   Cardiac Drugs II:  -Group instruction provided by verbal instruction and written materials to support subject.  Instructor reviews cardiac drug classes: angiotensin converting enzyme inhibitors (ACE-I), angiotensin II receptor blockers (ARBs), nitrates, and calcium channel blockers.  Instructor discusses reasons, side effects, and lifestyle considerations for each drug class.   Anatomy and Physiology of the Circulatory System:  Group verbal and written instruction and models provide basic cardiac anatomy and physiology, with the coronary electrical and arterial systems. Review of: AMI, Angina, Valve disease, Heart Failure, Peripheral Artery Disease, Cardiac Arrhythmia, Pacemakers, and the ICD.   Other Education:  -Group or individual verbal, written, or video instructions that support the educational goals of the cardiac rehab program.   Knowledge Questionnaire Score:   Core Components/Risk Factors/Patient Goals at Admission:     Personal Goals and Risk Factors at Admission - 03/25/17 1144      Core Components/Risk Factors/Patient Goals on Admission   Diabetes Yes   Intervention Provide education about signs/symptoms and action to take for hypo/hyperglycemia.;Provide education about proper nutrition, including hydration, and aerobic/resistive exercise prescription along with prescribed medications to achieve blood glucose in normal ranges: Fasting glucose 65-99 mg/dL   Expected Outcomes Short Term: Participant verbalizes understanding of the signs/symptoms and immediate care of hyper/hypoglycemia, proper foot care and importance of medication, aerobic/resistive exercise and nutrition plan for blood glucose control.;Long Term: Attainment  of HbA1C < 7%.      Core Components/Risk Factors/Patient Goals Review:    Core Components/Risk Factors/Patient Goals at Discharge (Final Review):    ITP Comments:     ITP Comments    Row Name 03/25/17 0854           ITP Comments Medical Director, Dr, Fransico Him          Comments:  Patient attended orientation from 0800 to 1000 to review rules and guidelines for program. Completed 6 minute walk test, Intitial ITP, and exercise prescription.  VSS. Telemetry-SR.  Asymptomatic.Brief Psychosocial Assessment - Pt is happy and is looking forward to participating in [place]. Pt has several church members who are prior participants of the rehab program.  Pt denies any needs or need for further interventions. Cherre Huger, BSN Cardiac and Training and development officer

## 2017-03-25 NOTE — Progress Notes (Signed)
Cardiac Rehab Medication Review by a Pharmacist  Does the patient  feel that his/her medications are working for him/her?  yes  Has the patient been experiencing any side effects to the medications prescribed?  no  Does the patient measure his/her own blood pressure or blood glucose at home?  yes ; when she feels like it's high.   Does the patient have any problems obtaining medications due to transportation or finances?   no  Understanding of regimen: good Understanding of indications: good Potential of compliance: good    Pharmacist comments: Patient has multiple allergy reactions. Of note, she states she tolerates 1-2 shots of morphine in regards to pain medications. She reports a reaction to Imdur so her cardiologist has since discontinued. She reports good compliance to the rest of her medications.      Bridgett Larsson, PharmD, [facility] PGY1 Pharmacy Resident 03/25/2017 8:54 AM

## 2017-03-26 NOTE — Progress Notes (Signed)
Reviewed and agree with initial management plan.  Malcolm T. Stark, MD FACG 

## 2017-03-29 ENCOUNTER — Encounter (HOSPITAL_COMMUNITY)
Admission: RE | Admit: 2017-03-29 | Discharge: 2017-03-29 | Disposition: A | Discharge status: Home / Self Care | Payer: Medicare Other | Source: Ambulatory Visit | Attending: Cardiovascular Disease | Admitting: Cardiovascular Disease

## 2017-03-29 ENCOUNTER — Encounter (HOSPITAL_COMMUNITY): Payer: Medicare Other

## 2017-03-29 ENCOUNTER — Encounter (HOSPITAL_COMMUNITY): Payer: Self-pay

## 2017-03-29 DIAGNOSIS — Z955 Presence of coronary angioplasty implant and graft: Secondary | ICD-10-CM

## 2017-03-29 DIAGNOSIS — Z48812 Encounter for surgical aftercare following surgery on the circulatory system: Secondary | ICD-10-CM | POA: Diagnosis not present

## 2017-03-29 NOTE — Progress Notes (Signed)
Daily Session Note  Patient Details  Name: Ruth [name] MRN: [id number] Date of Birth: [birth year] Referring Provider:     CARDIAC REHAB PHASE II ORIENTATION from 03/25/2017 in [place]  Referring Provider  [name] MD      Encounter Date: 03/29/2017  Check In:     Session Check In - 03/29/17 1326      Check-In   Location [facility]   Staff Present Cleda Mccreedy, MS, Exercise Physiologist;Amber Fair, MS, ACSM RCEP, Exercise Physiologist;Olinty Celesta Aver, MS, ACSM CEP, Exercise Physiologist;Promise Bushong, RN, BSN;Carlette Carlton, RN, BSN   Supervising physician immediately available to respond to emergencies [facility] immediately available   Physician(s) Dr. Burnis Medin   Medication changes reported     No   Fall or balance concerns reported    No   Tobacco Cessation No Change   Warm-up and Cool-down Performed as group-led instruction   Resistance Training Performed Yes   VAD Patient? No     Pain Assessment   Currently in Pain? No/denies   Multiple Pain Sites No      Capillary Blood Glucose: No results found for this or any previous visit (from the past 24 hour(s)).    History  Smoking Status  . Never Smoker  Smokeless Tobacco  . Never Used    Goals Met:  Exercise tolerated well  Goals Unmet:  Not Applicable  Comments: Pt started cardiac rehab today.  Pt tolerated light exercise without difficulty. VSS, telemetry-sinus rhythm,  asymptomatic.  Medication list reconciled. Pt denies barriers to medicaiton compliance.  PSYCHOSOCIAL ASSESSMENT:  PHQ-0. Pt exhibits positive coping skills, hopeful outlook with supportive family. No psychosocial needs identified at this time, no psychosocial interventions necessary.    Pt enjoys attending church activities, doing yard work and house work. .   Pt goals for cardiac rehab are to get stronger and be able to resume using her battery powered push lawn mower. She used to  be able to cut 1/3 acre.  Pt instructed to participate in cardiac rehab and home exercise programs to increase ability to resume this level of activity. Pt also cautioned against overexerting herself, to wait until given medical clearance to resume heavy house/yard activities.    Pt oriented to exercise equipment and routine.    Understanding verbalized.   Dr. Fransico Him is Medical Director for Cardiac Rehab at [facility].

## 2017-03-31 ENCOUNTER — Encounter (HOSPITAL_COMMUNITY)
Admission: RE | Admit: 2017-03-31 | Discharge: 2017-03-31 | Disposition: A | Discharge status: Home / Self Care | Payer: Medicare Other | Source: Ambulatory Visit | Attending: Cardiovascular Disease | Admitting: Cardiovascular Disease

## 2017-03-31 ENCOUNTER — Encounter (HOSPITAL_COMMUNITY): Payer: Medicare Other

## 2017-03-31 DIAGNOSIS — Z955 Presence of coronary angioplasty implant and graft: Secondary | ICD-10-CM

## 2017-03-31 DIAGNOSIS — Z48812 Encounter for surgical aftercare following surgery on the circulatory system: Secondary | ICD-10-CM | POA: Diagnosis not present

## 2017-04-02 ENCOUNTER — Encounter (HOSPITAL_COMMUNITY): Payer: Medicare Other

## 2017-04-02 ENCOUNTER — Encounter (HOSPITAL_COMMUNITY)
Admission: RE | Admit: 2017-04-02 | Discharge: 2017-04-02 | Disposition: A | Discharge status: Home / Self Care | Payer: Medicare Other | Source: Ambulatory Visit | Attending: Cardiovascular Disease | Admitting: Cardiovascular Disease

## 2017-04-02 DIAGNOSIS — Z48812 Encounter for surgical aftercare following surgery on the circulatory system: Secondary | ICD-10-CM | POA: Diagnosis not present

## 2017-04-02 DIAGNOSIS — Z955 Presence of coronary angioplasty implant and graft: Secondary | ICD-10-CM

## 2017-04-05 ENCOUNTER — Ambulatory Visit (INDEPENDENT_AMBULATORY_CARE_PROVIDER_SITE_OTHER): Payer: Medicare Other | Admitting: Physician Assistant

## 2017-04-05 ENCOUNTER — Encounter: Payer: Self-pay | Admitting: Physician Assistant

## 2017-04-05 ENCOUNTER — Encounter (HOSPITAL_COMMUNITY): Payer: Medicare Other

## 2017-04-05 ENCOUNTER — Encounter (HOSPITAL_COMMUNITY)
Admission: RE | Admit: 2017-04-05 | Discharge: 2017-04-05 | Disposition: A | Discharge status: Home / Self Care | Payer: Medicare Other | Source: Ambulatory Visit | Attending: Cardiovascular Disease | Admitting: Cardiovascular Disease

## 2017-04-05 ENCOUNTER — Telehealth: Payer: Self-pay | Admitting: Physician Assistant

## 2017-04-05 VITALS — BP 200/90 | HR 65 | Ht 62.0 in | Wt 176.0 lb

## 2017-04-05 DIAGNOSIS — I10 Essential (primary) hypertension: Secondary | ICD-10-CM

## 2017-04-05 DIAGNOSIS — I25810 Atherosclerosis of coronary artery bypass graft(s) without angina pectoris: Secondary | ICD-10-CM | POA: Diagnosis not present

## 2017-04-05 DIAGNOSIS — R079 Chest pain, unspecified: Secondary | ICD-10-CM

## 2017-04-05 DIAGNOSIS — E785 Hyperlipidemia, unspecified: Secondary | ICD-10-CM | POA: Diagnosis not present

## 2017-04-05 DIAGNOSIS — Z955 Presence of coronary angioplasty implant and graft: Secondary | ICD-10-CM

## 2017-04-05 DIAGNOSIS — Z48812 Encounter for surgical aftercare following surgery on the circulatory system: Secondary | ICD-10-CM | POA: Diagnosis not present

## 2017-04-05 MED ORDER — PANTOPRAZOLE SODIUM 40 MG PO TBEC: 40.0000 mg | DELAYED_RELEASE_TABLET | Freq: Two times a day (BID) | ORAL | 11 refills | Status: DC

## 2017-04-05 MED ORDER — LOSARTAN POTASSIUM 50 MG PO TABS: 50.0000 mg | ORAL_TABLET | Freq: Two times a day (BID) | ORAL | 3 refills | Status: DC

## 2017-04-05 NOTE — Patient Instructions (Signed)
Medication Instructions:  HOLD ZANTAC START PROTONIX 40MG TWICE DAILY STOP VALSARTAN START LOSARTAN 50MG TWICE DAILY  If you need a refill on your cardiac medications before your next appointment, please call your pharmacy.  Labwork: CBC TODAY HERE IN OUR OFFICE AT [facility]  Follow-Up: Your physician wants you to follow-up in: [place].   Your physician wants you to follow-up:  WITH  PHARMACIST FOR BP MEDICATION MANAGEMENT  Special Instructions: TAKE BP AND LOG EVERY 2 DAYS CALL IF >139/89 TO LET us KNOW.   Thank you for choosing [facility] at [facility]!!

## 2017-04-05 NOTE — Telephone Encounter (Signed)
Patient calling, states that she had stent installed a couple weeks ago. Patient states that she has pain around the site where stent was installed. Patient states that " I am not going to ED for emergencies so you can tell them that." Patient has rehab at 1pm today so please call home # at [phone] or cell # [phone].

## 2017-04-05 NOTE — Telephone Encounter (Signed)
Patient states she is still having right side chest pain .  She has gone to GI. Patient has not used NTG  SUBLINGUAL TABLETS." The pain is is not that bad enough."    APPOINTMENT HAS BEEN MADE FOR 11:30 AM  TODAY . PATIENT VERBALIZED UNDERSTANDING.

## 2017-04-05 NOTE — Progress Notes (Signed)
Cardiology Office Note   Date:  04/05/2017   ID:  Mckynleigh, Mussell 04-24-1946, MRN [id number]  PCP:  Crist Infante, MD  Cardiologist:  Dr Claiborne Billings, 12/17/2016 Almyra Deforest, [facility] 03/12/2017  Rosaria Ferries, PA-C   Chief Complaint  Patient presents with  . Follow-up    right sided chest pain , coughing and valsartan recall needs new meds     History of Present Illness: Ruth [name] is a 71 y.o. female with a history of CAD s/p CABG x 4 (LIMA to LAD, SVG to diag, SVG to OM, SVG to RCA) 2016 in [place], HTN, HLD, DM, DES to mid SVG-PDA/PLA 07/2012, Myoview 03/2015 showed EF 65%, small defect of mild severity in basal inferolateral wall, overall considered low risk. Echo 03/13/2015 w/ EF 55-60%, mild MR, PA peak pressure 32 mmHg. Intolerant to statins, never tried Zetia, not interested in [place].  Ruth [name] presents for cardiology follow up.  She is having the pain she was having prior to the stent. She initially got relief, but then, after she went home, the symptoms started back. While she was in the hospital, she likely got Protonix. When she went home, she did not go back on her home dose of ranitidine. Since she saw Tye Savoy, NP for GI, she was started back on ranitidine and is to start on Miralax for chronic constipation. She has not started on the Miralax yet because when her bowels move, she cannot control them. She is worried about going to rehab And having an accident.  Her pain is present all the time in the epigastric area. The pain gets worse after she eats, up to 8/10. This is still not as bad as pre-stent. It has also improved since being started back on the Ranitidine. She will belch, that does not always help. No hx ulcers. She is compliant with her medications.   She is doing well in cardiac rehab, not having any increased chest pain with exertion.  She is concerned about the valsartan, but tolerated the losartan she was previously on.    Past  Medical History:  Diagnosis Date  . Anxiety   . CAD (coronary artery disease)    a. CABG x 4 in 2006 in [place] (LIMA->LAD, VG->Diag, VG->OM, VG->RCA); b. DES to midbody of SVG-PDA/PLA 07/2012; c. 03/2015 Myoview: EF 65%, small defect of mild severit in basal inferolateral wall, low risk.  . Complication of anesthesia    "quit breathing when I got ?Inovar" (07/28/2012)  . Depression   . Diverticulosis   . Dyslipidemia    Intolerant of statins  . Fecal incontinence   . GERD (gastroesophageal reflux disease)   . Hyperlipidemia   . Hyperplastic colon polyp   . IBS (irritable bowel syndrome)   . Labile hypertension    a. 09/2016 Renal Artery duplex: nl renal arteries.  . Migraines    "had them in my 30's" (07/28/2012)  . Multiple allergies   . Obesity   . Steatohepatitis   . Type II diabetes mellitus (HCC)    a. no meds, diet controlled - takes cinnamon.    Past Surgical History:  Procedure Laterality Date  . ABDOMINAL HYSTERECTOMY  1970's  . BREAST LUMPECTOMY     bilateral  . CARDIAC CATHETERIZATION  2006  . CORONARY ANGIOPLASTY WITH STENT PLACEMENT  07/28/2012   "1; first time for me" (07/28/2012)  . CORONARY ARTERY BYPASS GRAFT  2006   CABG X4  . CORONARY STENT  INTERVENTION N/A 03/01/2017   Procedure: Coronary Stent Intervention;  Surgeon: Troy Sine, MD;  Location: [place] [facility];  Service: Cardiovascular;  Laterality: N/A;  . EXCISIONAL HEMORRHOIDECTOMY  1970's  . INGUINAL HERNIA REPAIR  1970's?   left  . LEFT HEART CATH AND CORS/GRAFTS ANGIOGRAPHY N/A 03/01/2017   Procedure: Left Heart Cath and Cors/Grafts Angiography;  Surgeon: Troy Sine, MD;  Location: [facility];  Service: Cardiovascular;  Laterality: N/A;  . LEFT HEART CATHETERIZATION WITH CORONARY/GRAFT ANGIOGRAM N/A 07/28/2012   Procedure: LEFT HEART CATHETERIZATION WITH Beatrix Fetters;  Surgeon: Burnell Blanks, MD;  Location: [facility] C[facility];  Service: Cardiovascular;   Laterality: N/A;  . PERCUTANEOUS CORONARY STENT INTERVENTION (PCI-S)  07/28/2012   Procedure: PERCUTANEOUS CORONARY STENT INTERVENTION (PCI-S);  Surgeon: Burnell Blanks, MD;  Location: [facility] CATH LAB;  Service: Cardiovascular;;  . TONSILLECTOMY AND ADENOIDECTOMY  ~ 1951    Current Outpatient Prescriptions  Medication Sig Dispense Refill  . aspirin EC 81 MG tablet Take 81 mg by mouth daily.    . Biotin 1000 MCG tablet Take 500 mcg by mouth daily.    . calcium carbonate (TUMS - DOSED IN MG ELEMENTAL CALCIUM) 500 MG chewable tablet Chew 1-2 tablets by mouth 3 (three) times daily as needed for heartburn.     . cholecalciferol (VITAMIN D) 1000 UNITS tablet Take 1,000 Units by mouth daily.    . Cranberry-Vitamin C-Vitamin E (CRANBERRY PLUS VITAMIN C) 4200-20-3 MG-MG-UNIT CAPS Take 1 capsule by mouth daily.    . metoprolol (LOPRESSOR) 100 MG tablet take 1 tablet by mouth twice a day 60 tablet 6  . nitroGLYCERIN (NITROSTAT) 0.4 MG SL tablet Place 1 tablet (0.4 mg total) under the tongue every 5 (five) minutes as needed for chest pain (up to 3 doses). 25 tablet 4  . prasugrel (EFFIENT) 10 MG TABS tablet Take 1 tablet (10 mg total) by mouth daily. 30 tablet 0  . pravastatin (PRAVACHOL) 20 MG tablet Take 1 tablet (20 mg total) by mouth every evening. 90 tablet 3  . ranitidine (ZANTAC) 75 MG tablet Take 150 mg by mouth 2 (two) times daily as needed for heartburn (gas).    . Simethicone (GAS-X PO) Take 1 tablet by mouth 2 (two) times daily.    . valsartan (DIOVAN) 160 MG tablet Take 160 mg by mouth 2 (two) times daily.     . vitamin B-12 (CYANOCOBALAMIN) 1000 MCG tablet Take 1,000 mcg by mouth daily.     No current facility-administered medications for this visit.     Allergies:   Betadine [povidone iodine]; Codeine; Demerol; Iohexol; Latex; Other; Percocet [oxycodone-acetaminophen]; Plavix [clopidogrel bisulfate]; Red dye; Shellfish allergy; Sulfonamide derivatives; Tylenol [acetaminophen];  Statins; and Imdur [isosorbide nitrate]    Social History:  The patient  reports that she has never smoked. She has never used smokeless tobacco. She reports that she does not drink alcohol or use drugs.   Family History:  The patient's family history includes Breast cancer in her unknown relative; Colon cancer in her father; Colon polyps in her father; Coronary artery disease in her father; Diabetes in her maternal grandmother and paternal grandmother; Heart disease in her father; Hypertension in her unknown relative; Stroke in her mother.    ROS:  Please see the history of present illness. All other systems are reviewed and negative.    PHYSICAL EXAM: VS:  BP (!) 200/90   Pulse 65   Ht 5' 2"  (1.575 m)   Wt  176 lb (79.8 kg)   BMI 32.19 kg/m  , BMI Body mass index is 32.19 kg/m. GEN: Well nourished, well developed, female in no acute distress  HEENT: normal for age  Neck: no JVD, no carotid bruit, no masses Cardiac: RRR; no murmur, no rubs, or gallops Respiratory:  clear to auscultation bilaterally, normal work of breathing GI: soft, nontender, nondistended, + BS MS: no deformity or atrophy; no edema; distal pulses are 2+ in all 4 extremities   Skin: warm and dry, no rash Neuro:  Strength and sensation are intact Psych: euthymic mood, full affect   EKG:  EKG is ordered today. The ekg ordered today demonstrates sinus rhythm, heart rate 65. Mildly abnormal lateral ST segments are not significantly different from previous ECG.  CATH: 03/01/2017  Prox Cx to Mid Cx lesion, 100 %stenosed.  1st Mrg lesion, 100 %stenosed.  Ost LAD to Prox LAD lesion, 70 %stenosed.  Prox RCA lesion, 75 %stenosed.  Mid RCA lesion, 100 %stenosed.  SVG. Prox Graft lesion, 60 %stenosed.  LIMA.  SVG Dist Graft lesion, 95 %stenosed.  A STENT SYNERGY DES 3.5X32 drug eluting stent was successfully placed.  Mid Graft lesion, 60 %stenosed.  Post intervention, there is a 0% residual  stenosis.  The left ventricular systolic function is normal.  LV end diastolic pressure is normal.  The left ventricular ejection fraction is 55-65% by visual estimate.  Preserved global LV contractility with an ejection fraction of  55-65% Significant multivessel native CAD with 70% diffuse proximal LAD stenosis, total occlusion of the circumflex and OM1 vessel proximally, and 75% proximal RCA stenosis with occlusion of the RCA prior to the acute margin. Patent LIMA graft which supplies the mid LAD and may also anastomose into the diagonal vessel. SVG supplying the distal marginal branch of the circumflex vessel with smooth 60% proximal to mid body stenosis and TIMI-3 flow. SVG supplying the distal RCA with previously noted stented segment with focal 60% stenosis followed by 95-99% thrombotic stenosis beyond the stented segment. Successful PCI to the SVG to the RCA treated with PTCA and stenting with a 3.532 mm Synergy stent postdilated to 3.71 mm with a Sport and exercise psychologist wire for distal graft protection from embolization. RECOMMENDATION: The patient should continue dual antiplatelet therapy indefinitely.  Medical therapy will need to be adjusted.  The patient has been resistant to take medicines in the past.  She will also be rechallenged with low dose statin therapy. Post-Intervention Diagram        Recent Labs: 12/14/2016: ALT 12 03/02/2017: BUN 19; Creatinine, Ser 0.92; Hemoglobin 13.6; Platelets 199; Potassium 3.5; Sodium 141    Lipid Panel    Component Value Date/Time   CHOL 244 (H) 03/19/2015 0812   TRIG 262 (H) 03/19/2015 0812   HDL 29 (L) 03/19/2015 0812   CHOLHDL 8.4 03/19/2015 0812   VLDL 52 (H) 03/19/2015 0812   LDLCALC 163 (H) 03/19/2015 0812   LDLDIRECT 160.9 06/21/2012 0833     Wt Readings from Last 3 Encounters:  04/05/17 176 lb (79.8 kg)  03/25/17 179 lb 15.4 oz (81.6 kg)  03/23/17 176 lb (79.8 kg)     Other studies Reviewed: Additional  studies/ records that were reviewed today include: Office notes, hospital records and testing.  ASSESSMENT AND PLAN:  1.  Chest pain: Her symptoms are atypical and more consistent with GI origin. She was restarted on the ranitidine by the GI NP, but that has not helped very much. I will give her a  temporary prescription for Protonix and have her follow-up with GI. She is willing to do this. She was relieved to learn that her ECG was okay. I feel confident that her stent is open because the pain is not consistent with angina. She is to continue aspirin at 81 mg daily, Lopressor 100 mg daily, Effient 10 mg daily and Pravachol 20 mg daily.  2. Hypertension: She had previously been on losartan, but was changed to valsartan for better blood pressure control. However, her valsartan has possibly been recalled. We will change her back to losartan at her previous dose and follow her blood pressure.   She is very reluctant to start any new medications because she has allergies and intolerances to so many. Her pressure is elevated today, but she says that it does not normally run that high. She is to check her blood pressure every couple of days and write down. Follow-up with the pharmacist in a month or so when hopefully, she will be less anxious about her heart and her GI issues will have improved. She might possibly tolerate amlodipine, but with her IBS and problems with chronic constipation, I will hold off for now.  3. Hyperlipidemia: Continue Pravachol for now. Keep appointments for fasting blood work per [facility] previous note.   Current medicines are reviewed at length with the patient today.  The patient has concerns regarding medicines.  The following changes have been made:  Change valsartan to losartan, hold Zantac, start Protonix  Labs/ tests ordered today include:   Orders Placed This Encounter  Procedures  . CBC  . EKG 12-Lead     Disposition:   FU with Dr Claiborne Billings  Signed, Rosaria Ferries, PA-C  04/05/2017 6:04 PM    [place] Phone: [phone]; Fax: [phone]  This note was written with the assistance of speech recognition software. Please excuse any transcriptional errors.

## 2017-04-06 ENCOUNTER — Telehealth: Payer: Self-pay

## 2017-04-06 NOTE — Telephone Encounter (Signed)
Called  Jessie-paula gunther, np office she states that she does have appt available 04-20-17 she will call pt and schedule appt

## 2017-04-07 ENCOUNTER — Encounter (HOSPITAL_COMMUNITY)
Admission: RE | Admit: 2017-04-07 | Discharge: 2017-04-07 | Disposition: A | Discharge status: Home / Self Care | Payer: Medicare Other | Source: Ambulatory Visit | Attending: Cardiovascular Disease | Admitting: Cardiovascular Disease

## 2017-04-07 ENCOUNTER — Encounter (HOSPITAL_COMMUNITY): Payer: Medicare Other

## 2017-04-07 DIAGNOSIS — Z48812 Encounter for surgical aftercare following surgery on the circulatory system: Secondary | ICD-10-CM | POA: Diagnosis not present

## 2017-04-07 DIAGNOSIS — Z955 Presence of coronary angioplasty implant and graft: Secondary | ICD-10-CM | POA: Diagnosis present

## 2017-04-07 NOTE — Telephone Encounter (Signed)
Thanks

## 2017-04-09 ENCOUNTER — Encounter (HOSPITAL_COMMUNITY): Payer: Medicare Other

## 2017-04-09 ENCOUNTER — Encounter (HOSPITAL_COMMUNITY)
Admission: RE | Admit: 2017-04-09 | Discharge: 2017-04-09 | Disposition: A | Discharge status: Home / Self Care | Payer: Medicare Other | Source: Ambulatory Visit | Attending: Cardiovascular Disease | Admitting: Cardiovascular Disease

## 2017-04-09 DIAGNOSIS — Z955 Presence of coronary angioplasty implant and graft: Secondary | ICD-10-CM

## 2017-04-09 DIAGNOSIS — Z48812 Encounter for surgical aftercare following surgery on the circulatory system: Secondary | ICD-10-CM | POA: Diagnosis not present

## 2017-04-12 ENCOUNTER — Encounter (HOSPITAL_COMMUNITY): Payer: Medicare Other

## 2017-04-12 ENCOUNTER — Encounter (HOSPITAL_COMMUNITY)
Admission: RE | Admit: 2017-04-12 | Discharge: 2017-04-12 | Disposition: A | Discharge status: Home / Self Care | Payer: Medicare Other | Source: Ambulatory Visit | Attending: Cardiovascular Disease | Admitting: Cardiovascular Disease

## 2017-04-12 DIAGNOSIS — Z955 Presence of coronary angioplasty implant and graft: Secondary | ICD-10-CM

## 2017-04-12 DIAGNOSIS — Z48812 Encounter for surgical aftercare following surgery on the circulatory system: Secondary | ICD-10-CM | POA: Diagnosis not present

## 2017-04-14 ENCOUNTER — Encounter (HOSPITAL_COMMUNITY): Payer: Medicare Other

## 2017-04-14 ENCOUNTER — Encounter (HOSPITAL_COMMUNITY)
Admission: RE | Admit: 2017-04-14 | Discharge: 2017-04-14 | Disposition: A | Discharge status: Home / Self Care | Payer: Medicare Other | Source: Ambulatory Visit | Attending: Cardiovascular Disease | Admitting: Cardiovascular Disease

## 2017-04-14 DIAGNOSIS — Z48812 Encounter for surgical aftercare following surgery on the circulatory system: Secondary | ICD-10-CM | POA: Diagnosis not present

## 2017-04-14 DIAGNOSIS — Z955 Presence of coronary angioplasty implant and graft: Secondary | ICD-10-CM

## 2017-04-16 ENCOUNTER — Encounter (HOSPITAL_COMMUNITY): Payer: Medicare Other

## 2017-04-16 ENCOUNTER — Encounter (HOSPITAL_COMMUNITY)
Admission: RE | Admit: 2017-04-16 | Discharge: 2017-04-16 | Disposition: A | Discharge status: Home / Self Care | Payer: Medicare Other | Source: Ambulatory Visit | Attending: Cardiovascular Disease | Admitting: Cardiovascular Disease

## 2017-04-16 DIAGNOSIS — Z955 Presence of coronary angioplasty implant and graft: Secondary | ICD-10-CM

## 2017-04-16 DIAGNOSIS — Z48812 Encounter for surgical aftercare following surgery on the circulatory system: Secondary | ICD-10-CM | POA: Diagnosis not present

## 2017-04-19 ENCOUNTER — Ambulatory Visit (HOSPITAL_COMMUNITY)
Admission: RE | Admit: 2017-04-19 | Discharge: 2017-04-19 | Disposition: A | Discharge status: Home / Self Care | Payer: Medicare Other | Source: Ambulatory Visit | Attending: Family Medicine | Admitting: Family Medicine

## 2017-04-19 ENCOUNTER — Encounter (HOSPITAL_COMMUNITY)
Admission: RE | Admit: 2017-04-19 | Discharge: 2017-04-19 | Disposition: A | Discharge status: Home / Self Care | Payer: Medicare Other | Source: Ambulatory Visit | Attending: Cardiovascular Disease | Admitting: Cardiovascular Disease

## 2017-04-19 ENCOUNTER — Telehealth: Payer: Self-pay | Admitting: Cardiology

## 2017-04-19 ENCOUNTER — Other Ambulatory Visit: Payer: Self-pay

## 2017-04-19 ENCOUNTER — Encounter (HOSPITAL_COMMUNITY): Payer: Medicare Other

## 2017-04-19 DIAGNOSIS — Z955 Presence of coronary angioplasty implant and graft: Secondary | ICD-10-CM | POA: Diagnosis not present

## 2017-04-19 DIAGNOSIS — Z48812 Encounter for surgical aftercare following surgery on the circulatory system: Secondary | ICD-10-CM | POA: Diagnosis not present

## 2017-04-19 NOTE — Progress Notes (Signed)
Incomplete Session Note  Patient Details  Name: SHAUNIE BOEHM MRN: [id number] Date of Birth: 1946/04/23 Referring Provider:     CARDIAC REHAB PHASE II ORIENTATION from 03/25/2017 in [place]  Referring Provider  [name] MD      [name] did not complete her rehab session.  Wilmarie reported having some chest discomfort since yesterday. "I think it is gas." Blood pressure 144/60. Oxygen saturation 96% on room air. Tanielle describes her discomfort as a needing to belch. 12 lead ECG obtained. Kerin Ransom [facility] called. Luke reviewed the 12 lead ECG and said the ECG looked unchanged form her last ECG. Lurena Joiner said that [place] can resume exercise and keep her next scheduled visit at Dr Evette Georges office. Shany says she has an appointment to see the GI doctor tomorrow. Shannon plans to return to exercise tomorrow.[name], RN,BSN 04/19/2017 6:29 PM

## 2017-04-19 NOTE — Telephone Encounter (Signed)
I was called by Verdis Frederickson, RN in Cardiac Rehab about Ms Earwood. The pt complained of epigastric discomfort while in Rehab. An EKG was done which I reviewed, no change from previous with lateral TWI. I reviewed her recent cath and office note where it was felt her symptoms were not cardiac. The pt has a GI appointment for tomorrow. I offered for her to go to the ED but she felt her symptoms were not bad and declined.  Kerin Ransom PA-C 04/19/2017 10:24 AM

## 2017-04-20 ENCOUNTER — Encounter: Payer: Self-pay | Admitting: Nurse Practitioner

## 2017-04-20 ENCOUNTER — Ambulatory Visit (INDEPENDENT_AMBULATORY_CARE_PROVIDER_SITE_OTHER): Payer: Medicare Other | Admitting: Nurse Practitioner

## 2017-04-20 VITALS — BP 180/98 | HR 72 | Ht 62.75 in | Wt 179.1 lb

## 2017-04-20 DIAGNOSIS — R1013 Epigastric pain: Secondary | ICD-10-CM | POA: Diagnosis not present

## 2017-04-20 DIAGNOSIS — K219 Gastro-esophageal reflux disease without esophagitis: Secondary | ICD-10-CM | POA: Diagnosis not present

## 2017-04-20 NOTE — Progress Notes (Signed)
HPI: Patient is a 71 year old female who we've seen several times in the last few months for upper abdominal discomfort, chest pain  and belching. Recently increased her Zantac dose as she was concerned about trying a PPI due to multiple drug allergies. Patient has coronary artery disease status post CABG times 4. She recently underwent PCI , on dual anti-platelet therapy now. While in the hospital patient's home Zantac was stopped, she received pantoprazole instead. No adverse side effects experienced with the PPI. She wasn't continued on Pantoprazole at time of discharge. She continued to have the same intermittent upper abdominal discomfort and belching as was present prior to PCI. Cardiology has restarted BID pantoprazole and she does feel a lot better. She has reduced the dose to 1/2 BID. Silvestre Gunner while at cardiac rehabilitation she had another episode of epigastric / chest  discomfort. An EKG was done and reviewed by cardiology,  no changes compared to previous EKG. Symptoms not felt to be cardiac in nature. Patient went home and took a teaspoon of vinegar and has not had any pain since.. Patient took care of father before his death and he had similar GI symptomats so she is a little concerned about underlying pathology  Past Medical History:  Diagnosis Date  . Anxiety   . CAD (coronary artery disease)    a. CABG x 4 in 2006 in [place] (LIMA->LAD, VG->Diag, VG->OM, VG->RCA); b. DES to midbody of SVG-PDA/PLA 07/2012; c. 03/2015 Myoview: EF 65%, small defect of mild severit in basal inferolateral wall, low risk.  . Complication of anesthesia    "quit breathing when I got ?Inovar" (07/28/2012)  . Depression   . Diverticulosis   . Dyslipidemia    Intolerant of statins  . Fecal incontinence   . GERD (gastroesophageal reflux disease)   . Hyperlipidemia   . Hyperplastic colon polyp   . IBS (irritable bowel syndrome)   . Labile hypertension    a. 09/2016 Renal Artery duplex: nl renal  arteries.  . Migraines    "had them in my 30's" (07/28/2012)  . Multiple allergies   . Obesity   . Steatohepatitis   . Type II diabetes mellitus (HCC)    a. no meds, diet controlled - takes cinnamon.    Patient's surgical history, family medical history, social history, medications and allergies were all reviewed in Epic    Physical Exam: There were no vitals taken for this visit.  GENERAL: well developed white female in NAD PSYCH: :Pleasant, cooperative, normal affect EENT:  conjunctiva pink, mucous membranes moist, neck supple without masses CARDIAC:  RRR, no peripheral edema PULM: Normal respiratory effort, lungs CTA bilaterally, no wheezing ABDOMEN:  soft, nontender, nondistended, no obvious masses, no hepatomegaly,  normal bowel sounds SKIN:  turgor, no lesions seen Musculoskeletal:  Normal muscle tone, normal strength NEURO: Alert and oriented x 3, no focal neurologic deficits    ASSESSMENT and PLAN:  1. Pleasant 71 year old female with chronic intermittent upper abdominal / chest discomfort and belching. Father had similar symptoms before his death. She is doing better on PPI and a teaspoon of vinegar twice a day. She takes tums and gas-x s needed -Her GERD/ dyspepsia regimen seems to be a little more complex than necessary but it is working for her riight now -reassurance provided.  -call for recurrent problems.   2. CAD / hx of CABG / recent stenting  I spent 25 minutes of face-to-face time with the patient. Greater than 50% of  the time was spent counseling and coordinating care.    Tye Savoy , NP 04/20/2017, 10:32 AM

## 2017-04-20 NOTE — Progress Notes (Signed)
Cardiac Individual Treatment Plan  Patient Details  Name: Ruth [name] MRN: [id number] Date of Birth: 1946/08/09 Referring Provider:     [facility] from 03/25/2017 in [facility]  Referring Provider  [name] MD      Initial Encounter Date:    CARDIAC REHAB PHASE II ORIENTATION from 03/25/2017 in [facility]  Date  03/25/17  Referring Provider  [name] MD      Visit Diagnosis: 03/02/17 Status post coronary artery stent placement  Patient's Home Medications on Admission:  Current Outpatient Prescriptions:  .  aspirin EC 81 MG tablet, Take 81 mg by mouth daily., Disp: , Rfl:  .  Biotin 1000 MCG tablet, Take 500 mcg by mouth daily., Disp: , Rfl:  .  calcium carbonate (TUMS - DOSED IN MG ELEMENTAL CALCIUM) 500 MG chewable tablet, Chew 1-2 tablets by mouth 3 (three) times daily as needed for heartburn. , Disp: , Rfl:  .  cholecalciferol (VITAMIN D) 1000 UNITS tablet, Take 1,000 Units by mouth daily., Disp: , Rfl:  .  Cranberry-Vitamin C-Vitamin E (CRANBERRY PLUS VITAMIN C) 4200-20-3 MG-MG-UNIT CAPS, Take 1 capsule by mouth daily., Disp: , Rfl:  .  losartan (COZAAR) 50 MG tablet, Take 1 tablet (50 mg total) by mouth 2 (two) times daily., Disp: 60 tablet, Rfl: 3 .  metoprolol (LOPRESSOR) 100 MG tablet, take 1 tablet by mouth twice a day, Disp: 60 tablet, Rfl: 6 .  nitroGLYCERIN (NITROSTAT) 0.4 MG SL tablet, Place 1 tablet (0.4 mg total) under the tongue every 5 (five) minutes as needed for chest pain (up to 3 doses)., Disp: 25 tablet, Rfl: 4 .  pantoprazole (PROTONIX) 40 MG tablet, Take 1 tablet (40 mg total) by mouth 2 (two) times daily. (Patient taking differently: Take 20 mg by mouth daily. ), Disp: 60 tablet, Rfl: 11 .  prasugrel (EFFIENT) 10 MG TABS tablet, Take 1 tablet (10 mg total) by mouth daily., Disp: 30 tablet, Rfl: 0 .  pravastatin (PRAVACHOL) 20 MG tablet, Take 1 tablet (20 mg total)  by mouth every evening., Disp: 90 tablet, Rfl: 3 .  Simethicone (GAS-X PO), Take 1 tablet by mouth 2 (two) times daily., Disp: , Rfl:  .  vitamin B-12 (CYANOCOBALAMIN) 1000 MCG tablet, Take 1,000 mcg by mouth daily., Disp: , Rfl:   Past Medical History: Past Medical History:  Diagnosis Date  . Anxiety   . CAD (coronary artery disease)    a. CABG x 4 in 2006 in [place] (LIMA->LAD, VG->Diag, VG->OM, VG->RCA); b. DES to midbody of SVG-PDA/PLA 07/2012; c. 03/2015 Myoview: EF 65%, small defect of mild severit in basal inferolateral wall, low risk.  . Complication of anesthesia    "quit breathing when I got ?[place]" (07/28/2012)  . Depression   . Diverticulosis   . Dyslipidemia    Intolerant of statins  . Fecal incontinence   . GERD (gastroesophageal reflux disease)   . Hyperlipidemia   . Hyperplastic colon polyp   . IBS (irritable bowel syndrome)   . Labile hypertension    a. 09/2016 Renal Artery duplex: nl renal arteries.  . Migraines    "had them in my 30's" (07/28/2012)  . Multiple allergies   . Obesity   . Steatohepatitis   . Type II diabetes mellitus (HCC)    a. no meds, diet controlled - takes cinnamon.    Tobacco Use: History  Smoking Status  . Never Smoker  Smokeless Tobacco  .  Never Used    Labs: Recent Review Flowsheet Data    Labs for ITP Cardiac and Pulmonary Rehab Latest Ref Rng & Units 12/15/2012 12/15/2012 02/13/2014 03/19/2015 03/02/2017   Cholestrol 0 - 200 mg/dL - - - 244(H) -   LDLCALC 0 - 99 mg/dL - - - 163(H) -   LDLDIRECT mg/dL - - - - -   HDL >=46 mg/dL - - - 29(L) -   Trlycerides <150 mg/dL - - - 262(H) -   Hemoglobin A1c 4.8 - 5.6 % - - 8.0(H) 8.7(H) 7.5(H)   TCO2 0 - 100 mmol/L 27 27 - - -      Capillary Blood Glucose: Lab Results  Component Value Date   GLUCAP 159 (H) 03/02/2017   GLUCAP 160 (H) 03/02/2017   GLUCAP 152 (H) 03/01/2017   GLUCAP 187 (H) 03/01/2017   GLUCAP 218 (H) 03/01/2017     Exercise Target Goals:    Exercise  Program Goal: Individual exercise prescription set with THRR, safety & activity barriers. Participant demonstrates ability to understand and report RPE using BORG scale, to self-measure pulse accurately, and to acknowledge the importance of the exercise prescription.  Exercise Prescription Goal: Starting with aerobic activity 30 plus minutes a day, 3 days per week for initial exercise prescription. Provide home exercise prescription and guidelines that participant acknowledges understanding prior to discharge.  Activity Barriers & Risk Stratification:     Activity Barriers & Cardiac Risk Stratification - 03/25/17 0857      Activity Barriers & Cardiac Risk Stratification   Activity Barriers Arthritis;Deconditioning;Muscular Weakness;Other (comment)   Comments arthritis B ankles   Cardiac Risk Stratification High      6 Minute Walk:     6 Minute Walk    Row Name 03/25/17 1128         6 Minute Walk   Phase Initial     Distance 1600 feet     Walk Time 6 minutes     # of Rest Breaks 0     MPH 3.03     METS 3.3     RPE 11     VO2 Peak 11.55     Symptoms No     Resting HR 59 bpm     Resting BP 148/84     Max Ex. HR 100 bpm     Max Ex. BP 182/84     2 Minute Post BP 162/84        Oxygen Initial Assessment:   Oxygen Re-Evaluation:   Oxygen Discharge (Final Oxygen Re-Evaluation):   Initial Exercise Prescription:     Initial Exercise Prescription - 03/25/17 1100      Date of Initial Exercise RX and Referring Provider   Date 03/25/17   Referring Provider [name] MD     Bike   Level 0.5   Minutes 10   METs 2.42     NuStep   Level 3   SPM 70   Minutes 10   METs 2     Track   Laps 9   Minutes 10   METs 2.57     Prescription Details   Frequency (times per week) 3   Duration Progress to 30 minutes of continuous aerobic without signs/symptoms of physical distress     Intensity   THRR 40-80% of Max Heartrate 59-118   Ratings of Perceived Exertion  11-15   Perceived Dyspnea 0-4     Progression   Progression Continue to progress workloads to maintain intensity without  signs/symptoms of physical distress.     Resistance Training   Training Prescription Yes   Weight 2lbs   Reps 10-15      Perform Capillary Blood Glucose checks as needed.  Exercise Prescription Changes:     Exercise Prescription Changes    Row Name 04/16/17 1300             Response to Exercise   Blood Pressure (Admit) 130/90       Blood Pressure (Exercise) 134/84       Blood Pressure (Exit) 132/84       Heart Rate (Admit) 71 bpm       Heart Rate (Exercise) 96 bpm       Heart Rate (Exit) 71 bpm       Rating of Perceived Exertion (Exercise) 12       Duration Progress to 45 minutes of aerobic exercise without signs/symptoms of physical distress       Intensity THRR unchanged         Progression   Progression Continue to progress workloads to maintain intensity without signs/symptoms of physical distress.       Average METs 2.4         Resistance Training   Training Prescription Yes       Weight 2lbs       Reps 10-15         Bike   Level 0.5       Minutes 10       METs 2.42         NuStep   Level 3       SPM 70       Minutes 10       METs 2         Track   Laps 14       Minutes 10       METs 2.57          Exercise Comments:     Exercise Comments    Row Name 04/16/17 1359           Exercise Comments Reviewed goals with pt.  She is off to a great start with exercise.           Exercise Goals and Review:     Exercise Goals    Row Name 03/25/17 0857             Exercise Goals   Increase Physical Activity Yes       Intervention Provide advice, education, support and counseling about physical activity/exercise needs.;Develop an individualized exercise prescription for aerobic and resistive training based on initial evaluation findings, risk stratification, comorbidities and participant's personal goals.       Expected  Outcomes Achievement of increased cardiorespiratory fitness and enhanced flexibility, muscular endurance and strength shown through measurements of functional capacity and personal statement of participant.       Increase Strength and Stamina Yes  return to yardwork; get back to using a push mower       Intervention Provide advice, education, support and counseling about physical activity/exercise needs.;Develop an individualized exercise prescription for aerobic and resistive training based on initial evaluation findings, risk stratification, comorbidities and participant's personal goals.       Expected Outcomes Achievement of increased cardiorespiratory fitness and enhanced flexibility, muscular endurance and strength shown through measurements of functional capacity and personal statement of participant.          Exercise Goals Re-Evaluation :  Exercise Goals Re-Evaluation    [place] Name 04/16/17 1357             Exercise Goal Re-Evaluation   Exercise Goals Review Increase Physical Activity;Increase Strenth and Stamina       Comments Pt is off to a great start with exercise and says she's already noticed her energy level has increased       Expected Outcomes Continue with exercise Rx and gradually increase workloads as tolerated in order to increase strength and stamina           Discharge Exercise Prescription (Final Exercise Prescription Changes):     Exercise Prescription Changes - 04/16/17 1300      Response to Exercise   Blood Pressure (Admit) 130/90   Blood Pressure (Exercise) 134/84   Blood Pressure (Exit) 132/84   Heart Rate (Admit) 71 bpm   Heart Rate (Exercise) 96 bpm   Heart Rate (Exit) 71 bpm   Rating of Perceived Exertion (Exercise) 12   Duration Progress to 45 minutes of aerobic exercise without signs/symptoms of physical distress   Intensity THRR unchanged     Progression   Progression Continue to progress workloads to maintain intensity without  signs/symptoms of physical distress.   Average METs 2.4     Resistance Training   Training Prescription Yes   Weight 2lbs   Reps 10-15     Bike   Level 0.5   Minutes 10   METs 2.42     NuStep   Level 3   SPM 70   Minutes 10   METs 2     Track   Laps 14   Minutes 10   METs 2.57      Nutrition:  Target Goals: Understanding of nutrition guidelines, daily intake of sodium <1552m, cholesterol <2064m calories 30% from fat and 7% or less from saturated fats, daily to have 5 or more servings of fruits and vegetables.  Biometrics:     Pre Biometrics - 03/25/17 1138      Pre Biometrics   Waist Circumference 40 inches   Hip Circumference 42 inches   Waist to Hip Ratio 0.95 %   Triceps Skinfold 39 mm   % Body Fat 45.8 %   Grip Strength 31 kg   Flexibility 16.5 in   Single Leg Stand 1.85 seconds       Nutrition Therapy Plan and Nutrition Goals:   Nutrition Discharge: Nutrition Scores:   Nutrition Goals Re-Evaluation:   Nutrition Goals Re-Evaluation:   Nutrition Goals Discharge (Final Nutrition Goals Re-Evaluation):   Psychosocial: Target Goals: Acknowledge presence or absence of significant depression and/or stress, maximize coping skills, provide positive support system. Participant is able to verbalize types and ability to use techniques and skills needed for reducing stress and depression.  Initial Review & Psychosocial Screening:     Initial Psych Review & Screening - 03/25/17 1313      Initial Review   Current issues with None Identified     Family Dynamics   Good Support System? Yes     Barriers   Psychosocial barriers to participate in program There are no identifiable barriers or psychosocial needs.     Screening Interventions   Interventions Encouraged to exercise      Quality of Life Scores:     Quality of Life - 03/29/17 1641      Quality of Life Scores   GLOBAL Pre --  pt overall scores excellent. pt demonstrates positive  outlook with good coping  skills.       PHQ-9: Recent Review Flowsheet Data    Depression screen [facility] 2/9 03/29/2017   Decreased Interest 0   Down, Depressed, Hopeless 0   PHQ - 2 Score 0     Interpretation of Total Score  Total Score Depression Severity:  1-4 = Minimal depression, 5-9 = Mild depression, 10-14 = Moderate depression, 15-19 = Moderately severe depression, 20-27 = Severe depression   Psychosocial Evaluation and Intervention:     Psychosocial Evaluation - 03/29/17 1636      Psychosocial Evaluation & Interventions   Interventions Encouraged to exercise with the program and follow exercise prescription   Comments no psychosocial needs identified, no interventions necessary.    Expected Outcomes pt will demonstrate positive outlook with good coping skills.    Continue Psychosocial Services  No Follow up required      Psychosocial Re-Evaluation:     Psychosocial Re-Evaluation    [place] Name 04/20/17 1608             Psychosocial Re-Evaluation   Current issues with None Identified       Interventions Stress management education;Encouraged to attend Cardiac Rehabilitation for the exercise       Continue Psychosocial Services  No Follow up required          Psychosocial Discharge (Final Psychosocial Re-Evaluation):     Psychosocial Re-Evaluation - 04/20/17 1608      Psychosocial Re-Evaluation   Current issues with None Identified   Interventions Stress management education;Encouraged to attend Cardiac Rehabilitation for the exercise   Continue Psychosocial Services  No Follow up required      Vocational Rehabilitation: Provide vocational rehab assistance to qualifying candidates.   Vocational Rehab Evaluation & Intervention:   Education: Education Goals: Education classes will be provided on a weekly basis, covering required topics. Participant will state understanding/return demonstration of topics presented.  Learning Barriers/Preferences:      Learning Barriers/Preferences - 03/25/17 0857      Learning Barriers/Preferences   Learning Barriers Sight   Learning Preferences Skilled Demonstration      Education Topics: Count Your Pulse:  -Group instruction provided by verbal instruction, demonstration, patient participation and written materials to support subject.  Instructors address importance of being able to find your pulse and how to count your pulse when at home without a heart monitor.  Patients get hands on experience counting their pulse with staff help and individually.   Heart Attack, Angina, and Risk Factor Modification:  -Group instruction provided by verbal instruction, video, and written materials to support subject.  Instructors address signs and symptoms of angina and heart attacks.    Also discuss risk factors for heart disease and how to make changes to improve heart health risk factors.   Functional Fitness:  -Group instruction provided by verbal instruction, demonstration, patient participation, and written materials to support subject.  Instructors address safety measures for doing things around the house.  Discuss how to get up and down off the floor, how to pick things up properly, how to safely get out of a chair without assistance, and balance training.   Meditation and Mindfulness:  -Group instruction provided by verbal instruction, patient participation, and written materials to support subject.  Instructor addresses importance of mindfulness and meditation practice to help reduce stress and improve awareness.  Instructor also leads participants through a meditation exercise.    Stretching for Flexibility and Mobility:  -Group instruction provided by verbal instruction, patient participation, and written materials to  support subject.  Instructors lead participants through series of stretches that are designed to increase flexibility thus improving mobility.  These stretches are additional exercise for  major muscle groups that are typically performed during regular warm up and cool down.   Hands Only CPR:  -Group verbal, video, and participation provides a basic overview of AHA guidelines for community CPR. Role-play of emergencies allow participants the opportunity to practice calling for help and chest compression technique with discussion of AED use.   Hypertension: -Group verbal and written instruction that provides a basic overview of hypertension including the most recent diagnostic guidelines, risk factor reduction with self-care instructions and medication management.    Nutrition I class: Heart Healthy Eating:  -Group instruction provided by PowerPoint slides, verbal discussion, and written materials to support subject matter. The instructor gives an explanation and review of the Therapeutic Lifestyle Changes diet recommendations, which includes a discussion on lipid goals, dietary fat, sodium, fiber, plant stanol/sterol esters, sugar, and the components of a well-balanced, healthy diet.   Nutrition II class: Lifestyle Skills:  -Group instruction provided by PowerPoint slides, verbal discussion, and written materials to support subject matter. The instructor gives an explanation and review of label reading, grocery shopping for heart health, heart healthy recipe modifications, and ways to make healthier choices when eating out.   Diabetes Question & Answer:  -Group instruction provided by PowerPoint slides, verbal discussion, and written materials to support subject matter. The instructor gives an explanation and review of diabetes co-morbidities, pre- and post-prandial blood glucose goals, pre-exercise blood glucose goals, signs, symptoms, and treatment of hypoglycemia and hyperglycemia, and foot care basics.   Diabetes Blitz:  -Group instruction provided by PowerPoint slides, verbal discussion, and written materials to support subject matter. The instructor gives an explanation  and review of the physiology behind type 1 and type 2 diabetes, diabetes medications and rational behind using different medications, pre- and post-prandial blood glucose recommendations and Hemoglobin A1c goals, diabetes diet, and exercise including blood glucose guidelines for exercising safely.    Portion Distortion:  -Group instruction provided by PowerPoint slides, verbal discussion, written materials, and food models to support subject matter. The instructor gives an explanation of serving size versus portion size, changes in portions sizes over the last 20 years, and what consists of a serving from each food group.   Stress Management:  -Group instruction provided by verbal instruction, video, and written materials to support subject matter.  Instructors review role of stress in heart disease and how to cope with stress positively.     Exercising on Your Own:  -Group instruction provided by verbal instruction, power point, and written materials to support subject.  Instructors discuss benefits of exercise, components of exercise, frequency and intensity of exercise, and end points for exercise.  Also discuss use of nitroglycerin and activating EMS.  Review options of places to exercise outside of rehab.  Review guidelines for sex with heart disease.   Cardiac Drugs I:  -Group instruction provided by verbal instruction and written materials to support subject.  Instructor reviews cardiac drug classes: antiplatelets, anticoagulants, beta blockers, and statins.  Instructor discusses reasons, side effects, and lifestyle considerations for each drug class.   Cardiac Drugs II:  -Group instruction provided by verbal instruction and written materials to support subject.  Instructor reviews cardiac drug classes: angiotensin converting enzyme inhibitors (ACE-I), angiotensin II receptor blockers (ARBs), nitrates, and calcium channel blockers.  Instructor discusses reasons, side effects, and lifestyle  considerations for each drug class.  Anatomy and Physiology of the Circulatory System:  Group verbal and written instruction and models provide basic cardiac anatomy and physiology, with the coronary electrical and arterial systems. Review of: AMI, Angina, Valve disease, Heart Failure, Peripheral Artery Disease, Cardiac Arrhythmia, Pacemakers, and the ICD.   Other Education:  -Group or individual verbal, written, or video instructions that support the educational goals of the cardiac rehab program.   Knowledge Questionnaire Score:   Core Components/Risk Factors/Patient Goals at Admission:     Personal Goals and Risk Factors at Admission - 03/25/17 1144      Core Components/Risk Factors/Patient Goals on Admission   Diabetes Yes   Intervention Provide education about signs/symptoms and action to take for hypo/hyperglycemia.;Provide education about proper nutrition, including hydration, and aerobic/resistive exercise prescription along with prescribed medications to achieve blood glucose in normal ranges: Fasting glucose 65-99 mg/dL   Expected Outcomes Short Term: Participant verbalizes understanding of the signs/symptoms and immediate care of hyper/hypoglycemia, proper foot care and importance of medication, aerobic/resistive exercise and nutrition plan for blood glucose control.;Long Term: Attainment of HbA1C < 7%.      Core Components/Risk Factors/Patient Goals Review:    Core Components/Risk Factors/Patient Goals at Discharge (Final Review):    ITP Comments:     ITP Comments    Row Name 03/25/17 0854           ITP Comments Medical Director, Dr, Fransico Him          Comments: Dxie is making expected progress toward personal goals after completing 10 sessions. Recommend continued exercise and life style modification education including  stress management and relaxation techniques to decrease cardiac risk profile. Will continue to monitor the patient throughout  the  program. Nitika is enjoying coming to the earlier class time.[name], RN,BSN 04/20/2017 4:21 PM

## 2017-04-20 NOTE — Patient Instructions (Signed)
If you are age 73 or older, your body mass index should be between 23-30. Your Body mass index is 31.98 kg/m. If this is out of the aforementioned range listed, please consider follow up with your Primary Care Provider.  If you are age 61 or younger, your body mass index should be between 19-25. Your Body mass index is 31.98 kg/m. If this is out of the aformentioned range listed, please consider follow up with your Primary Care Provider.   Follow up as needed.   Thank you for choosing me and [facility].   Tye Savoy, NP

## 2017-04-21 ENCOUNTER — Encounter (HOSPITAL_COMMUNITY): Payer: Medicare Other

## 2017-04-21 ENCOUNTER — Encounter (HOSPITAL_COMMUNITY)
Admission: RE | Admit: 2017-04-21 | Discharge: 2017-04-21 | Disposition: A | Discharge status: Home / Self Care | Payer: Medicare Other | Source: Ambulatory Visit | Attending: Cardiovascular Disease | Admitting: Cardiovascular Disease

## 2017-04-21 DIAGNOSIS — Z48812 Encounter for surgical aftercare following surgery on the circulatory system: Secondary | ICD-10-CM | POA: Diagnosis not present

## 2017-04-21 DIAGNOSIS — Z955 Presence of coronary angioplasty implant and graft: Secondary | ICD-10-CM

## 2017-04-21 NOTE — Progress Notes (Deleted)
Cardiac Individual Treatment Plan  Patient Details  Name: Ruth [name] MRN: [id number] Date of Birth: [birth year] Referring Provider:     [facility] from 03/25/2017 in [facility]  Referring Provider  [name] MD      Initial Encounter Date:    CARDIAC REHAB PHASE II ORIENTATION from 03/25/2017 in [facility]  Date  03/25/17  Referring Provider  [name] MD      Visit Diagnosis: 03/02/17 Status post coronary artery stent placement  Patient's Home Medications on Admission:  Current Outpatient Prescriptions:  .  aspirin EC 81 MG tablet, Take 81 mg by mouth daily., Disp: , Rfl:  .  Biotin 1000 MCG tablet, Take 500 mcg by mouth daily., Disp: , Rfl:  .  calcium carbonate (TUMS - DOSED IN MG ELEMENTAL CALCIUM) 500 MG chewable tablet, Chew 1-2 tablets by mouth 3 (three) times daily as needed for heartburn. , Disp: , Rfl:  .  cholecalciferol (VITAMIN D) 1000 UNITS tablet, Take 1,000 Units by mouth daily., Disp: , Rfl:  .  Cranberry-Vitamin C-Vitamin E (CRANBERRY PLUS VITAMIN C) 4200-20-3 MG-MG-UNIT CAPS, Take 1 capsule by mouth daily., Disp: , Rfl:  .  losartan (COZAAR) 50 MG tablet, Take 1 tablet (50 mg total) by mouth 2 (two) times daily., Disp: 60 tablet, Rfl: 3 .  metoprolol (LOPRESSOR) 100 MG tablet, take 1 tablet by mouth twice a day, Disp: 60 tablet, Rfl: 6 .  nitroGLYCERIN (NITROSTAT) 0.4 MG SL tablet, Place 1 tablet (0.4 mg total) under the tongue every 5 (five) minutes as needed for chest pain (up to 3 doses)., Disp: 25 tablet, Rfl: 4 .  pantoprazole (PROTONIX) 40 MG tablet, Take 1 tablet (40 mg total) by mouth 2 (two) times daily. (Patient taking differently: Take 20 mg by mouth daily. ), Disp: 60 tablet, Rfl: 11 .  prasugrel (EFFIENT) 10 MG TABS tablet, Take 1 tablet (10 mg total) by mouth daily., Disp: 30 tablet, Rfl: 0 .  pravastatin (PRAVACHOL) 20 MG tablet, Take 1 tablet (20 mg total)  by mouth every evening., Disp: 90 tablet, Rfl: 3 .  Simethicone (GAS-X PO), Take 1 tablet by mouth 2 (two) times daily., Disp: , Rfl:  .  vitamin B-12 (CYANOCOBALAMIN) 1000 MCG tablet, Take 1,000 mcg by mouth daily., Disp: , Rfl:   Past Medical History: Past Medical History:  Diagnosis Date  . Anxiety   . CAD (coronary artery disease)    a. CABG x 4 in 2006 in [place] (LIMA->LAD, VG->Diag, VG->OM, VG->RCA); b. DES to midbody of SVG-PDA/PLA 07/2012; c. 03/2015 Myoview: EF 65%, small defect of mild severit in basal inferolateral wall, low risk.  . Complication of anesthesia    "quit breathing when I got ?[place]" (07/28/2012)  . Depression   . Diverticulosis   . Dyslipidemia    Intolerant of statins  . Fecal incontinence   . GERD (gastroesophageal reflux disease)   . Hyperlipidemia   . Hyperplastic colon polyp   . IBS (irritable bowel syndrome)   . Labile hypertension    a. 09/2016 Renal Artery duplex: nl renal arteries.  . Migraines    "had them in my 30's" (07/28/2012)  . Multiple allergies   . Obesity   . Steatohepatitis   . Type II diabetes mellitus (HCC)    a. no meds, diet controlled - takes cinnamon.    Tobacco Use: History  Smoking Status  . Never Smoker  Smokeless Tobacco  .  Never Used    Labs: Recent Review Flowsheet Data    Labs for ITP Cardiac and Pulmonary Rehab Latest Ref Rng & Units 12/15/2012 12/15/2012 02/13/2014 03/19/2015 03/02/2017   Cholestrol 0 - 200 mg/dL - - - 244(H) -   LDLCALC 0 - 99 mg/dL - - - 163(H) -   LDLDIRECT mg/dL - - - - -   HDL >=46 mg/dL - - - 29(L) -   Trlycerides <150 mg/dL - - - 262(H) -   Hemoglobin A1c 4.8 - 5.6 % - - 8.0(H) 8.7(H) 7.5(H)   TCO2 0 - 100 mmol/L 27 27 - - -      Capillary Blood Glucose: Lab Results  Component Value Date   GLUCAP 159 (H) 03/02/2017   GLUCAP 160 (H) 03/02/2017   GLUCAP 152 (H) 03/01/2017   GLUCAP 187 (H) 03/01/2017   GLUCAP 218 (H) 03/01/2017     Exercise Target Goals:    Exercise  Program Goal: Individual exercise prescription set with THRR, safety & activity barriers. Participant demonstrates ability to understand and report RPE using BORG scale, to self-measure pulse accurately, and to acknowledge the importance of the exercise prescription.  Exercise Prescription Goal: Starting with aerobic activity 30 plus minutes a day, 3 days per week for initial exercise prescription. Provide home exercise prescription and guidelines that participant acknowledges understanding prior to discharge.  Activity Barriers & Risk Stratification:     Activity Barriers & Cardiac Risk Stratification - 03/25/17 0857      Activity Barriers & Cardiac Risk Stratification   Activity Barriers Arthritis;Deconditioning;Muscular Weakness;Other (comment)   Comments arthritis B ankles   Cardiac Risk Stratification High      6 Minute Walk:     6 Minute Walk    Row Name 03/25/17 1128         6 Minute Walk   Phase Initial     Distance 1600 feet     Walk Time 6 minutes     # of Rest Breaks 0     MPH 3.03     METS 3.3     RPE 11     VO2 Peak 11.55     Symptoms No     Resting HR 59 bpm     Resting BP 148/84     Max Ex. HR 100 bpm     Max Ex. BP 182/84     2 Minute Post BP 162/84        Oxygen Initial Assessment:   Oxygen Re-Evaluation:   Oxygen Discharge (Final Oxygen Re-Evaluation):   Initial Exercise Prescription:     Initial Exercise Prescription - 03/25/17 1100      Date of Initial Exercise RX and Referring Provider   Date 03/25/17   Referring Provider [name] MD     Bike   Level 0.5   Minutes 10   METs 2.42     NuStep   Level 3   SPM 70   Minutes 10   METs 2     Track   Laps 9   Minutes 10   METs 2.57     Prescription Details   Frequency (times per week) 3   Duration Progress to 30 minutes of continuous aerobic without signs/symptoms of physical distress     Intensity   THRR 40-80% of Max Heartrate 59-118   Ratings of Perceived Exertion  11-15   Perceived Dyspnea 0-4     Progression   Progression Continue to progress workloads to maintain intensity without  signs/symptoms of physical distress.     Resistance Training   Training Prescription Yes   Weight 2lbs   Reps 10-15      Perform Capillary Blood Glucose checks as needed.  Exercise Prescription Changes:     Exercise Prescription Changes    Row Name 04/16/17 1300             Response to Exercise   Blood Pressure (Admit) 130/90       Blood Pressure (Exercise) 134/84       Blood Pressure (Exit) 132/84       Heart Rate (Admit) 71 bpm       Heart Rate (Exercise) 96 bpm       Heart Rate (Exit) 71 bpm       Rating of Perceived Exertion (Exercise) 12       Duration Progress to 45 minutes of aerobic exercise without signs/symptoms of physical distress       Intensity THRR unchanged         Progression   Progression Continue to progress workloads to maintain intensity without signs/symptoms of physical distress.       Average METs 2.4         Resistance Training   Training Prescription Yes       Weight 2lbs       Reps 10-15         Bike   Level 0.5       Minutes 10       METs 2.42         NuStep   Level 3       SPM 70       Minutes 10       METs 2         Track   Laps 14       Minutes 10       METs 2.57          Exercise Comments:     Exercise Comments    Row Name 04/16/17 1359           Exercise Comments Reviewed goals with pt.  She is off to a great start with exercise.           Exercise Goals and Review:     Exercise Goals    Row Name 03/25/17 0857             Exercise Goals   Increase Physical Activity Yes       Intervention Provide advice, education, support and counseling about physical activity/exercise needs.;Develop an individualized exercise prescription for aerobic and resistive training based on initial evaluation findings, risk stratification, comorbidities and participant's personal goals.       Expected  Outcomes Achievement of increased cardiorespiratory fitness and enhanced flexibility, muscular endurance and strength shown through measurements of functional capacity and personal statement of participant.       Increase Strength and Stamina Yes  return to yardwork; get back to using a push mower       Intervention Provide advice, education, support and counseling about physical activity/exercise needs.;Develop an individualized exercise prescription for aerobic and resistive training based on initial evaluation findings, risk stratification, comorbidities and participant's personal goals.       Expected Outcomes Achievement of increased cardiorespiratory fitness and enhanced flexibility, muscular endurance and strength shown through measurements of functional capacity and personal statement of participant.          Exercise Goals Re-Evaluation :  Exercise Goals Re-Evaluation    [place] Name 04/16/17 1357             Exercise Goal Re-Evaluation   Exercise Goals Review Increase Physical Activity;Increase Strenth and Stamina       Comments Pt is off to a great start with exercise and says she's already noticed her energy level has increased       Expected Outcomes Continue with exercise Rx and gradually increase workloads as tolerated in order to increase strength and stamina           Discharge Exercise Prescription (Final Exercise Prescription Changes):     Exercise Prescription Changes - 04/16/17 1300      Response to Exercise   Blood Pressure (Admit) 130/90   Blood Pressure (Exercise) 134/84   Blood Pressure (Exit) 132/84   Heart Rate (Admit) 71 bpm   Heart Rate (Exercise) 96 bpm   Heart Rate (Exit) 71 bpm   Rating of Perceived Exertion (Exercise) 12   Duration Progress to 45 minutes of aerobic exercise without signs/symptoms of physical distress   Intensity THRR unchanged     Progression   Progression Continue to progress workloads to maintain intensity without  signs/symptoms of physical distress.   Average METs 2.4     Resistance Training   Training Prescription Yes   Weight 2lbs   Reps 10-15     Bike   Level 0.5   Minutes 10   METs 2.42     NuStep   Level 3   SPM 70   Minutes 10   METs 2     Track   Laps 14   Minutes 10   METs 2.57      Nutrition:  Target Goals: Understanding of nutrition guidelines, daily intake of sodium <1570m, cholesterol <2027m calories 30% from fat and 7% or less from saturated fats, daily to have 5 or more servings of fruits and vegetables.  Biometrics:     Pre Biometrics - 03/25/17 1138      Pre Biometrics   Waist Circumference 40 inches   Hip Circumference 42 inches   Waist to Hip Ratio 0.95 %   Triceps Skinfold 39 mm   % Body Fat 45.8 %   Grip Strength 31 kg   Flexibility 16.5 in   Single Leg Stand 1.85 seconds       Nutrition Therapy Plan and Nutrition Goals:   Nutrition Discharge: Nutrition Scores:   Nutrition Goals Re-Evaluation:   Nutrition Goals Re-Evaluation:   Nutrition Goals Discharge (Final Nutrition Goals Re-Evaluation):   Psychosocial: Target Goals: Acknowledge presence or absence of significant depression and/or stress, maximize coping skills, provide positive support system. Participant is able to verbalize types and ability to use techniques and skills needed for reducing stress and depression.  Initial Review & Psychosocial Screening:     Initial Psych Review & Screening - 03/25/17 1313      Initial Review   Current issues with None Identified     Family Dynamics   Good Support System? Yes     Barriers   Psychosocial barriers to participate in program There are no identifiable barriers or psychosocial needs.     Screening Interventions   Interventions Encouraged to exercise      Quality of Life Scores:     Quality of Life - 03/29/17 1641      Quality of Life Scores   GLOBAL Pre --  pt overall scores excellent. pt demonstrates positive  outlook with good coping  skills.       PHQ-9: Recent Review Flowsheet Data    Depression screen [facility] 2/9 03/29/2017   Decreased Interest 0   Down, Depressed, Hopeless 0   PHQ - 2 Score 0     Interpretation of Total Score  Total Score Depression Severity:  1-4 = Minimal depression, 5-9 = Mild depression, 10-14 = Moderate depression, 15-19 = Moderately severe depression, 20-27 = Severe depression   Psychosocial Evaluation and Intervention:     Psychosocial Evaluation - 03/29/17 1636      Psychosocial Evaluation & Interventions   Interventions Encouraged to exercise with the program and follow exercise prescription   Comments no psychosocial needs identified, no interventions necessary.    Expected Outcomes pt will demonstrate positive outlook with good coping skills.    Continue Psychosocial Services  No Follow up required      Psychosocial Re-Evaluation:     Psychosocial Re-Evaluation    [place] Name 04/20/17 1608             Psychosocial Re-Evaluation   Current issues with None Identified       Interventions Stress management education;Encouraged to attend Cardiac Rehabilitation for the exercise       Continue Psychosocial Services  No Follow up required          Psychosocial Discharge (Final Psychosocial Re-Evaluation):     Psychosocial Re-Evaluation - 04/20/17 1608      Psychosocial Re-Evaluation   Current issues with None Identified   Interventions Stress management education;Encouraged to attend Cardiac Rehabilitation for the exercise   Continue Psychosocial Services  No Follow up required      Vocational Rehabilitation: Provide vocational rehab assistance to qualifying candidates.   Vocational Rehab Evaluation & Intervention:   Education: Education Goals: Education classes will be provided on a weekly basis, covering required topics. Participant will state understanding/return demonstration of topics presented.  Learning Barriers/Preferences:      Learning Barriers/Preferences - 03/25/17 0857      Learning Barriers/Preferences   Learning Barriers Sight   Learning Preferences Skilled Demonstration      Education Topics: Count Your Pulse:  -Group instruction provided by verbal instruction, demonstration, patient participation and written materials to support subject.  Instructors address importance of being able to find your pulse and how to count your pulse when at home without a heart monitor.  Patients get hands on experience counting their pulse with staff help and individually.   Heart Attack, Angina, and Risk Factor Modification:  -Group instruction provided by verbal instruction, video, and written materials to support subject.  Instructors address signs and symptoms of angina and heart attacks.    Also discuss risk factors for heart disease and how to make changes to improve heart health risk factors.   Functional Fitness:  -Group instruction provided by verbal instruction, demonstration, patient participation, and written materials to support subject.  Instructors address safety measures for doing things around the house.  Discuss how to get up and down off the floor, how to pick things up properly, how to safely get out of a chair without assistance, and balance training.   Meditation and Mindfulness:  -Group instruction provided by verbal instruction, patient participation, and written materials to support subject.  Instructor addresses importance of mindfulness and meditation practice to help reduce stress and improve awareness.  Instructor also leads participants through a meditation exercise.    Stretching for Flexibility and Mobility:  -Group instruction provided by verbal instruction, patient participation, and written materials to  support subject.  Instructors lead participants through series of stretches that are designed to increase flexibility thus improving mobility.  These stretches are additional exercise for  major muscle groups that are typically performed during regular warm up and cool down.   Hands Only CPR:  -Group verbal, video, and participation provides a basic overview of AHA guidelines for community CPR. Role-play of emergencies allow participants the opportunity to practice calling for help and chest compression technique with discussion of AED use.   Hypertension: -Group verbal and written instruction that provides a basic overview of hypertension including the most recent diagnostic guidelines, risk factor reduction with self-care instructions and medication management.    Nutrition I class: Heart Healthy Eating:  -Group instruction provided by PowerPoint slides, verbal discussion, and written materials to support subject matter. The instructor gives an explanation and review of the Therapeutic Lifestyle Changes diet recommendations, which includes a discussion on lipid goals, dietary fat, sodium, fiber, plant stanol/sterol esters, sugar, and the components of a well-balanced, healthy diet.   Nutrition II class: Lifestyle Skills:  -Group instruction provided by PowerPoint slides, verbal discussion, and written materials to support subject matter. The instructor gives an explanation and review of label reading, grocery shopping for heart health, heart healthy recipe modifications, and ways to make healthier choices when eating out.   Diabetes Question & Answer:  -Group instruction provided by PowerPoint slides, verbal discussion, and written materials to support subject matter. The instructor gives an explanation and review of diabetes co-morbidities, pre- and post-prandial blood glucose goals, pre-exercise blood glucose goals, signs, symptoms, and treatment of hypoglycemia and hyperglycemia, and foot care basics.   Diabetes Blitz:  -Group instruction provided by PowerPoint slides, verbal discussion, and written materials to support subject matter. The instructor gives an explanation  and review of the physiology behind type 1 and type 2 diabetes, diabetes medications and rational behind using different medications, pre- and post-prandial blood glucose recommendations and Hemoglobin A1c goals, diabetes diet, and exercise including blood glucose guidelines for exercising safely.    Portion Distortion:  -Group instruction provided by PowerPoint slides, verbal discussion, written materials, and food models to support subject matter. The instructor gives an explanation of serving size versus portion size, changes in portions sizes over the last 20 years, and what consists of a serving from each food group.   Stress Management:  -Group instruction provided by verbal instruction, video, and written materials to support subject matter.  Instructors review role of stress in heart disease and how to cope with stress positively.     Exercising on Your Own:  -Group instruction provided by verbal instruction, power point, and written materials to support subject.  Instructors discuss benefits of exercise, components of exercise, frequency and intensity of exercise, and end points for exercise.  Also discuss use of nitroglycerin and activating EMS.  Review options of places to exercise outside of rehab.  Review guidelines for sex with heart disease.   Cardiac Drugs I:  -Group instruction provided by verbal instruction and written materials to support subject.  Instructor reviews cardiac drug classes: antiplatelets, anticoagulants, beta blockers, and statins.  Instructor discusses reasons, side effects, and lifestyle considerations for each drug class.   Cardiac Drugs II:  -Group instruction provided by verbal instruction and written materials to support subject.  Instructor reviews cardiac drug classes: angiotensin converting enzyme inhibitors (ACE-I), angiotensin II receptor blockers (ARBs), nitrates, and calcium channel blockers.  Instructor discusses reasons, side effects, and lifestyle  considerations for each drug class.  Anatomy and Physiology of the Circulatory System:  Group verbal and written instruction and models provide basic cardiac anatomy and physiology, with the coronary electrical and arterial systems. Review of: AMI, Angina, Valve disease, Heart Failure, Peripheral Artery Disease, Cardiac Arrhythmia, Pacemakers, and the ICD.   Other Education:  -Group or individual verbal, written, or video instructions that support the educational goals of the cardiac rehab program.   Knowledge Questionnaire Score:   Core Components/Risk Factors/Patient Goals at Admission:     Personal Goals and Risk Factors at Admission - 03/25/17 1144      Core Components/Risk Factors/Patient Goals on Admission   Diabetes Yes   Intervention Provide education about signs/symptoms and action to take for hypo/hyperglycemia.;Provide education about proper nutrition, including hydration, and aerobic/resistive exercise prescription along with prescribed medications to achieve blood glucose in normal ranges: Fasting glucose 65-99 mg/dL   Expected Outcomes Short Term: Participant verbalizes understanding of the signs/symptoms and immediate care of hyper/hypoglycemia, proper foot care and importance of medication, aerobic/resistive exercise and nutrition plan for blood glucose control.;Long Term: Attainment of HbA1C < 7%.      Core Components/Risk Factors/Patient Goals Review:    Core Components/Risk Factors/Patient Goals at Discharge (Final Review):    ITP Comments:     ITP Comments    Row Name 03/25/17 0854           ITP Comments Medical Director, Dr, Fransico Him          Comments: Oletta is making expected progress toward personal goals after completing 10 sessions. Recommend continued exercise and life style modification education including  stress management and relaxation techniques to decrease cardiac risk profile. Will continue to monitor the patient throughout  the  program.Maria Venetia Maxon, RN,BSN 04/21/2017 5:00 PM

## 2017-04-23 ENCOUNTER — Encounter (HOSPITAL_COMMUNITY)
Admission: RE | Admit: 2017-04-23 | Discharge: 2017-04-23 | Disposition: A | Discharge status: Home / Self Care | Payer: Medicare Other | Source: Ambulatory Visit | Attending: Cardiovascular Disease | Admitting: Cardiovascular Disease

## 2017-04-23 ENCOUNTER — Encounter (HOSPITAL_COMMUNITY): Payer: Medicare Other

## 2017-04-23 VITALS — BP 118/80 | HR 64 | Ht 62.5 in | Wt 179.9 lb

## 2017-04-23 DIAGNOSIS — Z955 Presence of coronary angioplasty implant and graft: Secondary | ICD-10-CM

## 2017-04-23 DIAGNOSIS — Z48812 Encounter for surgical aftercare following surgery on the circulatory system: Secondary | ICD-10-CM | POA: Diagnosis not present

## 2017-04-23 NOTE — Progress Notes (Signed)
Reviewed home exercise guidelines with patient including endpoints, temperature precautions, target heart rate and rate of perceived exertion. Pt is currently walking 25 minutes, 4 days/week as her mode of home exercise. Encouraged pt to increase duration to 30 minutes by adding 1 minute each session until she reaches goal. Pt voices understanding of instructions given.  Sol Passer, MS, ACSM CEP

## 2017-04-26 ENCOUNTER — Encounter (HOSPITAL_COMMUNITY): Payer: Medicare Other

## 2017-04-26 ENCOUNTER — Telehealth: Payer: Self-pay | Admitting: Physician Assistant

## 2017-04-26 NOTE — Telephone Encounter (Signed)
New message    Pt c/o medication issue:  1. Name of Medication:pravastatin (PRAVACHOL) 20 MG tablet  2. How are you currently taking this medication (dosage and times per day)? 29m  3. Are you having a reaction (difficulty breathing--STAT)? Dizziness, legs hurting  4. What is your medication issue? Pt reports dizziness and her legs are hurting. She would like to stop this medication.

## 2017-04-26 NOTE — Telephone Encounter (Signed)
Patient started on pravastatin 03/12/17 by H. Meng, [place] after hospitalization + stent.   Per 02/24/17 note by H. Meng, PA : Hyperlipidemia: Intolerant to statins, may need to consider PCSK 9 inhibitor, however patient was not interested  Routed to pharmacy staff for recommendations

## 2017-04-26 NOTE — Telephone Encounter (Signed)
Patient willing to HOLD pravastatin for 1 week. She will call back to discuss symptoms with pharmacist, then resume at 17m 3x/week.  She is willing to titrate up dose as able to tolerate.  Refused PCSK9i - fearful of injectable medication due to multiple drug allergies/sensitivity.

## 2017-04-26 NOTE — Progress Notes (Signed)
Reviewed and agree with initial management plan.  Bayron Dalto T. Shafer Swamy, MD FACG 

## 2017-04-27 DIAGNOSIS — W19XXXA Unspecified fall, initial encounter: Secondary | ICD-10-CM | POA: Insufficient documentation

## 2017-04-28 ENCOUNTER — Encounter (HOSPITAL_COMMUNITY): Payer: Medicare Other

## 2017-04-30 ENCOUNTER — Encounter (HOSPITAL_COMMUNITY): Payer: Medicare Other

## 2017-04-30 ENCOUNTER — Telehealth (HOSPITAL_COMMUNITY): Payer: Self-pay | Admitting: *Deleted

## 2017-04-30 NOTE — Telephone Encounter (Signed)
Returned pt call.  Pt stated that she had fallen three times.  Pt has been in contact with her physician.  Felt the falls were a result of the statin medication. Pt multiple chemical allergies and does not tolerate medications well.  Pt will hold her statin medication.  Pt has follow up appt on Tuesday with her primary MD.  Will follow up with pt after the appt has been completed for plan of care. Pt unsure she can continue with cardiac rehab.  Cherre Huger, BSN Cardiac and Training and development officer

## 2017-05-03 ENCOUNTER — Encounter (HOSPITAL_COMMUNITY): Admission: RE | Admit: 2017-05-03 | Payer: Medicare Other | Source: Ambulatory Visit

## 2017-05-03 ENCOUNTER — Encounter (HOSPITAL_COMMUNITY): Payer: Medicare Other

## 2017-05-05 ENCOUNTER — Encounter (HOSPITAL_COMMUNITY): Payer: Medicare Other

## 2017-05-07 ENCOUNTER — Encounter (HOSPITAL_COMMUNITY): Payer: Medicare Other

## 2017-05-11 ENCOUNTER — Telehealth (HOSPITAL_COMMUNITY): Payer: Self-pay | Admitting: *Deleted

## 2017-05-11 NOTE — Progress Notes (Signed)
Discharge Progress Report  Patient Details  Name: Ruth [name] MRN: [id number] Date of Birth: March 28, 1946 Referring Provider:     [place] from 03/25/2017 in [place]  Referring Provider  [name],[name] MD       Number of Visits: 12  Reason for Discharge:  Early Exit:  Ruth [name] does not plan to return to exercise at this time  Smoking History:  History  Smoking Status  . Never Smoker  Smokeless Tobacco  . Never Used    Diagnosis:  03/02/17 Status post coronary artery stent placement  ADL UCSD:   Initial Exercise Prescription:     Initial Exercise Prescription - 03/25/17 1100      Date of Initial Exercise RX and Referring Provider   Date 03/25/17   Referring Provider Ruth Majestic MD     Bike   Level 0.5   Minutes 10   METs 2.42     NuStep   Level 3   SPM 70   Minutes 10   METs 2     Track   Laps 9   Minutes 10   METs 2.57     Prescription Details   Frequency (times per week) 3   Duration Progress to 30 minutes of continuous aerobic without signs/symptoms of physical distress     Intensity   THRR 40-80% of Max Heartrate 59-118   Ratings of Perceived Exertion 11-15   Perceived Dyspnea 0-4     Progression   Progression Continue to progress workloads to maintain intensity without signs/symptoms of physical distress.     Resistance Training   Training Prescription Yes   Weight 2lbs   Reps 10-15      Discharge Exercise Prescription (Final Exercise Prescription Changes):     Exercise Prescription Changes - 05/26/17 1700      Response to Exercise   Blood Pressure (Admit) 118/80  Exercise session on 04/23/17.   Blood Pressure (Exercise) 120/70   Blood Pressure (Exit) 132/80   Heart Rate (Admit) 64 bpm   Heart Rate (Exercise) 87 bpm   Heart Rate (Exit) 57 bpm   Rating of Perceived Exertion (Exercise) 13   Duration Progress to 45 minutes of aerobic exercise without signs/symptoms of  physical distress   Intensity THRR unchanged     Progression   Progression Continue to progress workloads to maintain intensity without signs/symptoms of physical distress.   Average METs 2.6     Resistance Training   Training Prescription Yes   Weight 2lbs   Reps 10-15     Interval Training   Interval Training No     Bike   Level 1   Minutes 10   METs 2.5     NuStep   Level 3   SPM 70   Minutes 10   METs 2.8     Track   Laps 12   Minutes 10   METs 3.09     Home Exercise Plan   Plans to continue exercise at Home (comment)   Frequency Add 2 additional days to program exercise sessions.   Initial Home Exercises Provided 04/23/17      Functional Capacity:     6 Minute Walk    Row Name 03/25/17 1128         6 Minute Walk   Phase Initial     Distance 1600 feet     Walk Time 6 minutes     # of Rest Breaks 0  MPH 3.03     METS 3.3     RPE 11     VO2 Peak 11.55     Symptoms No     Resting HR 59 bpm     Resting BP 148/84     Max Ex. HR 100 bpm     Max Ex. BP 182/84     2 Minute Post BP 162/84        Psychological, QOL, Others - Outcomes: PHQ 2/9: Depression screen PHQ 2/9 03/29/2017  Decreased Interest 0  Down, Depressed, Hopeless 0  PHQ - 2 Score 0    Quality of Life:     Quality of Life - 03/29/17 1641      Quality of Life Scores   GLOBAL Pre --  pt overall scores excellent. pt demonstrates positive outlook with good coping skills.       Personal Goals: Goals established at orientation with interventions provided to work toward goal.     Personal Goals and Risk Factors at Admission - 03/25/17 1144      Core Components/Risk Factors/Patient Goals on Admission   Diabetes Yes   Intervention Provide education about signs/symptoms and action to take for hypo/hyperglycemia.;Provide education about proper nutrition, including hydration, and aerobic/resistive exercise prescription along with prescribed medications to achieve blood glucose  in normal ranges: Fasting glucose 65-99 mg/dL   Expected Outcomes Short Term: Participant verbalizes understanding of the signs/symptoms and immediate care of hyper/hypoglycemia, proper foot care and importance of medication, aerobic/resistive exercise and nutrition plan for blood glucose control.;Long Term: Attainment of HbA1C < 7%.       Personal Goals Discharge:   Exercise Goals and Review:     Exercise Goals    Row Name 03/25/17 0857             Exercise Goals   Increase Physical Activity Yes       Intervention Provide advice, education, support and counseling about physical activity/exercise needs.;Develop an individualized exercise prescription for aerobic and resistive training based on initial evaluation findings, risk stratification, comorbidities and participant's personal goals.       Expected Outcomes Achievement of increased cardiorespiratory fitness and enhanced flexibility, muscular endurance and strength shown through measurements of functional capacity and personal statement of participant.       Increase Strength and Stamina Yes  return to yardwork; get back to using a push mower       Intervention Provide advice, education, support and counseling about physical activity/exercise needs.;Develop an individualized exercise prescription for aerobic and resistive training based on initial evaluation findings, risk stratification, comorbidities and participant's personal goals.       Expected Outcomes Achievement of increased cardiorespiratory fitness and enhanced flexibility, muscular endurance and strength shown through measurements of functional capacity and personal statement of participant.          Nutrition & Weight - Outcomes:     Pre Biometrics - 03/25/17 1138      Pre Biometrics   Waist Circumference 40 inches   Hip Circumference 42 inches   Waist to Hip Ratio 0.95 %   Triceps Skinfold 39 mm   % Body Fat 45.8 %   Grip Strength 31 kg   Flexibility 16.5  in   Single Leg Stand 1.85 seconds         Post Biometrics - 04/23/17 0954       Post  Biometrics   Height 5' 2.5" (1.588 m)   Weight 179 lb 14.3 oz (81.6 kg)  BMI (Calculated) 32.36      Nutrition:   Nutrition Discharge:   Education Questionnaire Score:   Ruth [name] attended 12 sessions between 03/25/2017- 04/23/2017. Ruth [name] has been out recently due to side effects from statins. Ruth [name] has been discharged from cardiac rehab at this time. Chandrika says she may return at a later date and time when she is feeling better.[name], RN,BSN 05/31/2017 5:05 PM

## 2017-05-12 ENCOUNTER — Encounter (HOSPITAL_COMMUNITY): Payer: Medicare Other

## 2017-05-12 ENCOUNTER — Telehealth: Payer: Self-pay | Admitting: Pharmacist

## 2017-05-12 ENCOUNTER — Encounter (HOSPITAL_COMMUNITY)
Admission: RE | Admit: 2017-05-12 | Discharge: 2017-05-12 | Disposition: A | Discharge status: Home / Self Care | Payer: Medicare Other | Source: Ambulatory Visit | Attending: Cardiovascular Disease | Admitting: Cardiovascular Disease

## 2017-05-12 DIAGNOSIS — Z955 Presence of coronary angioplasty implant and graft: Secondary | ICD-10-CM

## 2017-05-12 DIAGNOSIS — Z48812 Encounter for surgical aftercare following surgery on the circulatory system: Secondary | ICD-10-CM | POA: Insufficient documentation

## 2017-05-12 NOTE — Telephone Encounter (Signed)
Patient informed clinic her PCP (DR Perini) discontinued pravastatin and ALL statins due to sever muscle pain and problems with ambulation.  She started oat bran powder 100% natural twice daily for lipid management.

## 2017-05-12 NOTE — Telephone Encounter (Signed)
Patient request Dr Evette Georges assessment for discontinuation of Effient (prasugrel) and starting high dose aspirin.  Ruth [name] stated she was on this therpy in the past and currently experiencing body aches and increase fatigue due to Effient.

## 2017-05-12 NOTE — Telephone Encounter (Signed)
She cannot stop Effient.  She had a Synergy stent placed in June 2018 in the vein graft to RCA.  She needs to be on DAPT for minimum of one  year but ideally in light of her significant disease should be on DAPT indefinitely

## 2017-05-13 NOTE — Telephone Encounter (Signed)
Cardiologist recommendation given to patient.   Continue Effient 20m daily plus ASA 875mdaily to keep new stent open and working properly. May discuss with provider (in person) during f/u visit in 1 months.  Okay to call back if additional questions

## 2017-05-14 ENCOUNTER — Encounter (HOSPITAL_COMMUNITY): Payer: Medicare Other

## 2017-05-17 ENCOUNTER — Encounter (HOSPITAL_COMMUNITY): Payer: Medicare Other

## 2017-05-19 ENCOUNTER — Encounter (HOSPITAL_COMMUNITY): Payer: Medicare Other

## 2017-05-21 ENCOUNTER — Encounter (HOSPITAL_COMMUNITY): Payer: Medicare Other

## 2017-05-24 ENCOUNTER — Encounter (HOSPITAL_COMMUNITY): Payer: Medicare Other

## 2017-05-26 ENCOUNTER — Encounter (HOSPITAL_COMMUNITY): Payer: Medicare Other

## 2017-05-26 NOTE — Addendum Note (Signed)
Encounter addended by: Sol Passer on: 05/26/2017  5:13 PM<BR>    Actions taken: Flowsheet accepted, Flowsheet data copied forward, Vitals modified, Visit Navigator Flowsheet section accepted

## 2017-05-26 NOTE — Addendum Note (Signed)
Encounter addended by: Sol Passer on: 05/26/2017  5:18 PM<BR>    Actions taken: Flowsheet data copied forward, Visit Navigator Flowsheet section accepted

## 2017-05-27 ENCOUNTER — Ambulatory Visit (INDEPENDENT_AMBULATORY_CARE_PROVIDER_SITE_OTHER): Payer: Medicare Other | Admitting: Physician Assistant

## 2017-05-27 VITALS — BP 210/80 | HR 70 | Ht 62.0 in | Wt 177.0 lb

## 2017-05-27 DIAGNOSIS — K219 Gastro-esophageal reflux disease without esophagitis: Secondary | ICD-10-CM | POA: Diagnosis not present

## 2017-05-27 DIAGNOSIS — I10 Essential (primary) hypertension: Secondary | ICD-10-CM

## 2017-05-27 DIAGNOSIS — I25810 Atherosclerosis of coronary artery bypass graft(s) without angina pectoris: Secondary | ICD-10-CM | POA: Diagnosis not present

## 2017-05-27 DIAGNOSIS — E785 Hyperlipidemia, unspecified: Secondary | ICD-10-CM

## 2017-05-27 NOTE — Progress Notes (Signed)
Cardiology Office Note   Date:  05/27/2017   ID:  Ruth, [name] 10-Oct-1945, MRN [id number]  PCP:  Crist Infante, MD  Cardiologist:  Dr. Claiborne Billings, 12/17/2016  Ruth Ferries, PA-C 04/05/2017  Chief Complaint  Patient presents with  . Follow-up    pt c/o chest pain, since yesterday . also wants to discuss meds     History of Present Illness: Ruth [name] is a 71 y.o. female with a history of CABG x 4 (LIMA to LAD, SVG to diag, SVG to OM, SVG to RCA) 2016 in [place], HTN, HLD, DM, DES-SVG-PDA/PLA 02/2017, Echo 03/13/2015 w/ EF 55-60%, mild MR, PA peak pressure 32 mmHg. Intol statins, never tried Zetia, not interested in [place].  Office visit 07/30, still having some chest pain in the epigastric area, worse after eating, improved by ranitidine. Blood pressure was elevated but she felt it was better at home and she was going to recheck it. Valsartan changed to losartan because of the recall  Notes regarding side effects from the pravastatin, additional notes regarding side effects from the [place] presents for cardiology follow up.   She was struggling and falling (3 x in a day) on the pravastatin, she has stopped it. Her legs have gotten a little better, she is using a walker. She is gradually increasing her activity. With a walker, she can walk a mile. She is trying to do this as many days a week and she can. She is not having any exertional chest pain.  She has been having upper abd pain/chest pain, she wonders if she should go back on the Protonix bid.   Her SBP at home has been in 140s or lower at home.    Past Medical History:  Diagnosis Date  . Anxiety   . CAD (coronary artery disease)    a. CABG x 4 in 2006 in [place] (LIMA->LAD, VG->Diag, VG->OM, VG->RCA); b. DES to midbody of SVG-PDA/PLA 07/2012; c. 03/2015 Myoview: EF 65%, small defect of mild severit in basal inferolateral wall, low risk.  . Complication of anesthesia    "quit  breathing when I got ?[place]" (07/28/2012)  . Depression   . Diverticulosis   . Dyslipidemia    Intolerant of statins  . Fecal incontinence   . GERD (gastroesophageal reflux disease)   . Hyperlipidemia   . Hyperplastic colon polyp   . IBS (irritable bowel syndrome)   . Labile hypertension    a. 09/2016 Renal Artery duplex: nl renal arteries.  . Migraines    "had them in my 30's" (07/28/2012)  . Multiple allergies   . Obesity   . Steatohepatitis   . Type II diabetes mellitus (HCC)    a. no meds, diet controlled - takes cinnamon.    Past Surgical History:  Procedure Laterality Date  . ABDOMINAL HYSTERECTOMY  1970's  . BREAST LUMPECTOMY     bilateral  . CARDIAC CATHETERIZATION  2006  . CORONARY ANGIOPLASTY WITH STENT PLACEMENT  07/28/2012   "1; first time for me" (07/28/2012)  . CORONARY ARTERY BYPASS GRAFT  2006   CABG X4  . CORONARY STENT INTERVENTION N/A 03/01/2017   Procedure: Coronary Stent Intervention;  Surgeon: Troy Sine, MD;  Location: [facility];  Service: Cardiovascular;  Laterality: N/A;  . EXCISIONAL HEMORRHOIDECTOMY  1970's  . INGUINAL HERNIA REPAIR  1970's?   left  . LEFT HEART CATH AND CORS/GRAFTS ANGIOGRAPHY N/A 03/01/2017   Procedure: Left Heart  Cath and Cors/Grafts Angiography;  Surgeon: Troy Sine, MD;  Location: [facility];  Service: Cardiovascular;  Laterality: N/A;  . LEFT HEART CATHETERIZATION WITH CORONARY/GRAFT ANGIOGRAM N/A 07/28/2012   Procedure: LEFT HEART CATHETERIZATION WITH Beatrix Fetters;  Surgeon: Burnell Blanks, MD;  Location: [facility] C[facility];  Service: Cardiovascular;  Laterality: N/A;  . PERCUTANEOUS CORONARY STENT INTERVENTION (PCI-S)  07/28/2012   Procedure: PERCUTANEOUS CORONARY STENT INTERVENTION (PCI-S);  Surgeon: Burnell Blanks, MD;  Location[facility] C[facility];  Service: Cardiovascular;;  . TONSILLECTOMY AND ADENOIDECTOMY  ~ 1951    Current Outpatient Prescriptions  Medication Sig Dispense  Refill  . aspirin EC 81 MG tablet Take 81 mg by mouth daily.    . Biotin 1000 MCG tablet Take 500 mcg by mouth daily.    . calcium carbonate (TUMS - DOSED IN MG ELEMENTAL CALCIUM) 500 MG chewable tablet Chew 1-2 tablets by mouth 3 (three) times daily as needed for heartburn.     . cholecalciferol (VITAMIN D) 1000 UNITS tablet Take 1,000 Units by mouth daily.    Marland Kitchen CRANBERRY-VITAMIN C PO Take 1 tablet by mouth daily.    Marland Kitchen losartan (COZAAR) 50 MG tablet Take 1 tablet (50 mg total) by mouth 2 (two) times daily. 60 tablet 3  . metoprolol (LOPRESSOR) 100 MG tablet take 1 tablet by mouth twice a day 60 tablet 6  . nitroGLYCERIN (NITROSTAT) 0.4 MG SL tablet Place 1 tablet (0.4 mg total) under the tongue every 5 (five) minutes as needed for chest pain (up to 3 doses). 25 tablet 4  . Oat Bran Soluble POWD Take by mouth 2 (two) times daily.    . pantoprazole (PROTONIX) 40 MG tablet Take 1 tablet (40 mg total) by mouth 2 (two) times daily. (Patient taking differently: Take 20 mg by mouth daily. ) 60 tablet 11  . prasugrel (EFFIENT) 10 MG TABS tablet Take 1 tablet (10 mg total) by mouth daily. 30 tablet 0  . Simethicone (GAS-X PO) Take 1 tablet by mouth 2 (two) times daily.    . vitamin B-12 (CYANOCOBALAMIN) 1000 MCG tablet Take 1,000 mcg by mouth daily.     No current facility-administered medications for this visit.     Allergies:   Betadine [povidone iodine]; Codeine; Demerol; Iohexol; Latex; Other; Percocet [oxycodone-acetaminophen]; Plavix [clopidogrel bisulfate]; Red dye; Shellfish allergy; Sulfonamide derivatives; Tylenol [acetaminophen]; Pravastatin sodium; Statins; and Imdur [isosorbide nitrate]    Social History:  The patient  reports that she has never smoked. She has never used smokeless tobacco. She reports that she does not drink alcohol or use drugs.   Family History:  The patient's family history includes Breast cancer in her unknown relative; Colon cancer in her father; Colon polyps in her  father; Coronary artery disease in her father; Diabetes in her maternal grandmother and paternal grandmother; Heart disease in her father; Hypertension in her unknown relative; Stroke in her mother.    ROS:  Please see the history of present illness. All other systems are reviewed and negative.    PHYSICAL EXAM: VS:  BP (!) 210/80   Pulse 70   Ht 5' 2"  (1.575 m)   Wt 177 lb (80.3 kg)   BMI 32.37 kg/m  , BMI Body mass index is 32.37 kg/m. GEN: Well nourished, well developed, female in no acute distress  HEENT: normal for age  Neck: no JVD, no carotid bruit, no masses Cardiac: RRR; no murmur, no rubs, or gallops Respiratory:  clear to auscultation bilaterally,  normal work of breathing GI: soft, nontender, nondistended, + BS MS: no deformity or atrophy; no edema; distal pulses are 2+ in all 4 extremities   Skin: warm and dry, no rash Neuro:  Strength and sensation are intact Psych: euthymic mood, full affect   EKG:  EKG is ordered today. The ekg ordered today demonstrates SR, no new ischemic changes  CARDIAC CATH: 03/01/2017  Post-Intervention Diagram         Recent Labs: 12/14/2016: ALT 12 03/02/2017: BUN 19; Creatinine, Ser 0.92; Hemoglobin 13.6; Platelets 199; Potassium 3.5; Sodium 141    Lipid Panel    Component Value Date/Time   CHOL 244 (H) 03/19/2015 0812   TRIG 262 (H) 03/19/2015 0812   HDL 29 (L) 03/19/2015 0812   CHOLHDL 8.4 03/19/2015 0812   VLDL 52 (H) 03/19/2015 0812   LDLCALC 163 (H) 03/19/2015 0812   LDLDIRECT 160.9 06/21/2012 0833     Wt Readings from Last 3 Encounters:  05/27/17 177 lb (80.3 kg)  04/23/17 179 lb 14.3 oz (81.6 kg)  04/20/17 179 lb 2 oz (81.3 kg)     Other studies Reviewed: Additional studies/ records that were reviewed today include: office notes, hospital records and testing.  ASSESSMENT AND PLAN:  1.  CAD: s/p DES 02/2017. I discussed with her that I had no reason to believe that her leg weakness and balance problems related  to the Effient. I discussed with her that her stomach problems might be related to the Effient, and so she needs to make sure that the Protonix takes care of things. If it does not she is to call us.  She does not feel strong enough to go back to cardiac rehabilitation. I have encouraged her to keep working on strength and contact cardiac rehabilitation soon as she feels like she is ready. I encouraged her to get back in a situation of supervised exercise, as I feel this will help her get stronger faster and more safely. Continue baby aspirin, metoprolol, Cozaar and Effient.  2. GERD symptoms: She has epigastric discomfort. She had gone off the Protonix. I encouraged her to restart it and let us know if that does not manage her abdominal/epigastric pain.  3. Hyperlipidemia: She had leg weakness, balance problems and fell several times on the pravastatin. She has stopped. That is appropriate. After she recovers more completely from the symptoms, discuss PCSK9 with the pharmacist. Finances are an issues, so there is any clinical trial she is appropriate for that would be helpful as well.  4. Hypertension: Her blood pressure is elevated in the office today, but came down on recheck by myself. It dropped to 170 palpated. According to the patient when she checks her blood pressure at home it is rarely over 140. As she has multiple medication intolerances, I will not make any adjustments in her meds at this time. She is to continue to follow her blood pressure at home, and let us know if it is running higher at home than previous.   Current medicines are reviewed at length with the patient today.  The patient has concerns regarding medicines. Concerns were addressed  The following changes have been made:  Restart Protonix  Labs/ tests ordered today include:  No orders of the defined types were placed in this encounter.    Disposition:   FU with Dr. Claiborne Billings  Signed, Sierah Lacewell, Suanne Marker, PA-C  05/27/2017  2:22 PM    [place] Phone: [phone]; Fax: [phone]  This note was written with the assistance of speech recognition software. Please excuse any transcriptional errors.

## 2017-05-27 NOTE — Patient Instructions (Addendum)
Medication Instructions:  RE-START PROTONIX TWICE DAILY  OK TO TAKE MYLANTA AS NEEDED  OK TO STAY OFF THE PRAVASTATIN  DO NOT STOP OR MISS A DOSE OF EFFIENT If you need a refill on your cardiac medications before your next appointment, please call your pharmacy.  Follow-Up: Your physician wants you to follow-up in: [place], PA-C. CALL IN January TO SCHEDULE  Special Instructions: RE-START CARDIAC REHAB WHEN YOU CAN TOLERATE   INCREASE ACTIVITY AS TOLERATED   Thank you for choosing [facility]!!

## 2017-05-28 ENCOUNTER — Encounter (HOSPITAL_COMMUNITY): Payer: Medicare Other

## 2017-05-31 ENCOUNTER — Encounter (HOSPITAL_COMMUNITY): Payer: Medicare Other

## 2017-06-02 ENCOUNTER — Encounter (HOSPITAL_COMMUNITY): Payer: Medicare Other

## 2017-06-04 ENCOUNTER — Encounter (HOSPITAL_COMMUNITY): Payer: Medicare Other

## 2017-06-07 ENCOUNTER — Encounter (HOSPITAL_COMMUNITY): Payer: Medicare Other

## 2017-06-09 ENCOUNTER — Encounter (HOSPITAL_COMMUNITY): Payer: Medicare Other

## 2017-06-11 ENCOUNTER — Encounter (HOSPITAL_COMMUNITY): Payer: Medicare Other

## 2017-06-14 ENCOUNTER — Encounter (HOSPITAL_COMMUNITY): Payer: Medicare Other

## 2017-06-15 ENCOUNTER — Ambulatory Visit (INDEPENDENT_AMBULATORY_CARE_PROVIDER_SITE_OTHER): Payer: Medicare Other | Admitting: Cardiovascular Disease

## 2017-06-15 ENCOUNTER — Encounter: Payer: Self-pay | Admitting: Cardiovascular Disease

## 2017-06-15 VITALS — BP 168/100 | HR 66 | Ht 63.0 in | Wt 178.6 lb

## 2017-06-15 DIAGNOSIS — I10 Essential (primary) hypertension: Secondary | ICD-10-CM

## 2017-06-15 DIAGNOSIS — I2510 Atherosclerotic heart disease of native coronary artery without angina pectoris: Secondary | ICD-10-CM | POA: Diagnosis not present

## 2017-06-15 DIAGNOSIS — I25810 Atherosclerosis of coronary artery bypass graft(s) without angina pectoris: Secondary | ICD-10-CM

## 2017-06-15 DIAGNOSIS — Z789 Other specified health status: Secondary | ICD-10-CM

## 2017-06-15 DIAGNOSIS — Z889 Allergy status to unspecified drugs, medicaments and biological substances status: Secondary | ICD-10-CM | POA: Diagnosis not present

## 2017-06-15 DIAGNOSIS — E785 Hyperlipidemia, unspecified: Secondary | ICD-10-CM

## 2017-06-15 MED ORDER — AMLODIPINE BESYLATE 5 MG PO TABS: 5.0000 mg | ORAL_TABLET | Freq: Every day | ORAL | 3 refills | Status: DC

## 2017-06-15 NOTE — Progress Notes (Signed)
Patient ID: Ruth [name], female   DOB: [birth year], 71 y.o.   MRN: [id number]   Primary M.D.: Dr. Crist Infante  HPI: [name] is a 71 year old female who presents for a 6 month follow-up cardiology evaluation.  Ms Ruth [name] has a history of coronary artery disease.  While living in [place] she developed chest discomfort and in 11/20/04 underwent CABG surgery 4 at the [facility] in [place] by Dr. Marzetta Merino.  She had a LIMA to LAD, SVG to diagonal, SVG to obtuse marginal, and SVG to the RCA.   Her husband died in 11-20-10 and she moved to the [place] area.  In 11-21-11, she experienced symptoms of increasing shortness of breath.  She underwent cardiac catheterization and a stent was placed in the vein graft to her RCA.  She states that she has not had any subsequent stress testing.  She is a former patient of Dr. Einar Gip and an echocardiogram in July 2014 showed normal LV size and function with mild concentric left ventricular hypertrophy.  She had mild to moderate mitral regurgitation, mild tricuspid regurgitation, and mild primary hypertension with an estimated PA pressure 39 mm.  She has a history of hypertension for at least 15-20 years.  She states her blood pressure at home typically is in the 145-150 range, but her blood pressure is always elevated when she goes to the doctor's office.  She has a history of hyperlipidemia and apparently has been intolerant to statins but has never tried Zetia.  There is a history of diabetes mellitus but is not on therapy and states this is diet controlled.    She admits to  increased stress.  Her mother died in Dec 19, 2014 and recently her dog died.  She does not routinely exercise.  When I initially saw her in June 2016 she has been taking amlodipine 5 mg, losartan 50 mg twice a day and metoprolol 50 mrem twice a day for hypertension and CAD.  She has a history of multiple allergies.  She states that she is Intolerant to amlodipine as  well as Spironolactone.  She has been having labile blood pressure.  In the past. She became dehydrated on diarrhetic therapy.    She underwent an echo Doppler study on 03/13/2015 which showed an ejection fraction at 55-60%.  There was mild mitral regurgitation, mild left atrial dilatation, and very mild pulmonary hypertension with an estimated PA pressure 32 mm.  A nuclear perfusion study done on 03/21/2015 was low risk and demonstrated a very small region of mild ischemia in the basal anterolateral wall.  She had normal LV function with an EF of 65%.  Since I last saw her in April 2018, she developed recurrent episodes of chest pain in June which ultimately led to her undergoing definitive cardiac catheterization on 03/01/2017.  She was found to have preserved global LV contractility with an EF of 55-60%.  There was significant multivessel CAD with 70% diffuse proximal LAD stenoses, total occlusion of the circumflex and OM1 proximally, and 75% proximal RCA stenosis with occlusion of the RCA prior to the acute margin.  She had a patent LIMA graft supplying the mid LAD, which may also anastomose into the diagonal vessel, but this was uncertain.  The vein graft supplying the distal marginal circumflex branch had smooth 60% proximal to mid body stenosis with TIMI-3 flow.  The vein graft supplying the distal RCA had focal 60% stenosis followed by 99% thrombotic stenosis beyond a stented segment.  She underwent successful PCI to this vein graft to the RCA with insertion of a 3.532 mm Synergy stent postdilated to 3.71 mm with a Radiation protection practitioner used for distal graft protection.  It was recommended that she continue to antiplatelet therapy indefinitely.  [name] has significant allergies to multiple medications.  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  She has been on losartan 50 Milligan grams twice a  day, metoprolol 100 mg twice a day, and continues to take aspirin 81 mg and Effient 10 mg.  She did not tolerate Plavix or Brilinta.  She states that she is intolerant to statins.  She has no interest in PCSK9 inhibition.  She presents for evaluation  Past Medical History:  Diagnosis Date  . Anxiety   . CAD (coronary artery disease)    a. CABG x 4 in 2006 in [place] (LIMA->LAD, VG->Diag, VG->OM, VG->RCA); b. DES to midbody of SVG-PDA/PLA 07/2012; c. 03/2015 Myoview: EF 65%, small defect of mild severit in basal inferolateral wall, low risk.  . Complication of anesthesia    "quit breathing when I got ?[place]" (07/28/2012)  . Depression   . Diverticulosis   . Dyslipidemia    Intolerant of statins  . Fecal incontinence   . GERD (gastroesophageal reflux disease)   . Hyperlipidemia   . Hyperplastic colon polyp   . IBS (irritable bowel syndrome)   . Labile hypertension    a. 09/2016 Renal Artery duplex: nl renal arteries.  . Migraines    "had them in my 30's" (07/28/2012)  . Multiple allergies   . Obesity   . Steatohepatitis   . Type II diabetes mellitus (HCC)    a. no meds, diet controlled - takes cinnamon.    Past Surgical History:  Procedure Laterality Date  . ABDOMINAL HYSTERECTOMY  1970's  . BREAST LUMPECTOMY     bilateral  . CARDIAC CATHETERIZATION  2006  . CORONARY ANGIOPLASTY WITH STENT PLACEMENT  07/28/2012   "1; first time for me" (07/28/2012)  . CORONARY ARTERY BYPASS GRAFT  2006   CABG X4  . CORONARY STENT INTERVENTION N/A 03/01/2017   Procedure: Coronary Stent Intervention;  Surgeon: Troy Sine, MD;  Location: [place] [facility];  Service: Cardiovascular;  Laterality: N/A;  . EXCISIONAL HEMORRHOIDECTOMY  1970's  . INGUINAL HERNIA REPAIR  1970's?   left  . LEFT HEART CATH AND CORS/GRAFTS ANGIOGRAPHY N/A 03/01/2017   Procedure: Left Heart Cath and Cors/Grafts Angiography;  Surgeon: Troy Sine, MD;  Location: [place] [facility];  Service: Cardiovascular;   Laterality: N/A;  . LEFT HEART CATHETERIZATION WITH CORONARY/GRAFT ANGIOGRAM N/A 07/28/2012   Procedure: LEFT HEART CATHETERIZATION WITH Beatrix Fetters;  Surgeon: Burnell Blanks, MD;  Location: [facility] CATH LAB;  Service: Cardiovascular;  Laterality: N/A;  . PERCUTANEOUS CORONARY STENT INTERVENTION (PCI-S)  07/28/2012   Procedure: PERCUTANEOUS CORONARY STENT INTERVENTION (PCI-S);  Surgeon: Burnell Blanks, MD;  Location: [facility] CATH LAB;  Service: Cardiovascular;;  . TONSILLECTOMY AND ADENOIDECTOMY  ~ 1951    Allergies  Allergen Reactions  . Betadine [Povidone Iodine] Shortness Of Breath and Swelling  . Codeine Anaphylaxis and Shortness Of Breath    "quit breathing" (07/28/2012)  **Tolerates 1-2 shots of morphine**  . Demerol Anaphylaxis    "quit breathing" (07/28/2012)  . Iohexol Anaphylaxis    Finger/ankle swelling  . Latex Anaphylaxis    "quit breathing" (07/28/2012)  . Other Anaphylaxis  Perfume, Any Fragrance. Cleaning Fluids.  "quit breathing" (07/28/2012)  . Percocet [Oxycodone-Acetaminophen] Anaphylaxis    "quit breathing; disoriented" (07/28/2012)  . Plavix [Clopidogrel Bisulfate] Anaphylaxis    "get hot; like I'm burning up inside; had to put me in shower after OHS because of that" (07/28/2012)  . Red Dye Anaphylaxis    "quit breathing" (07/28/2012)  . Shellfish Allergy Shortness Of Breath    "broke out in knots all over" (07/28/2012)  . Sulfonamide Derivatives Shortness Of Breath and Rash    "quit breathing" (07/28/2012)  . Tylenol [Acetaminophen] Shortness Of Breath and Itching  . Pravastatin Sodium Other (See Comments)    Leg pain, problems ambulating   . Statins     Muscle pain  . Imdur [Isosorbide Nitrate] Other (See Comments)    Intolerance. "Can't remember the reaction."    Current Outpatient Prescriptions  Medication Sig Dispense Refill  . aspirin EC 81 MG tablet Take 81 mg by mouth daily.    . Biotin 1000 MCG tablet Take 500 mcg by  mouth daily.    . calcium carbonate (TUMS - DOSED IN MG ELEMENTAL CALCIUM) 500 MG chewable tablet Chew 1-2 tablets by mouth 3 (three) times daily as needed for heartburn.     . cholecalciferol (VITAMIN D) 1000 UNITS tablet Take 1,000 Units by mouth daily.    Marland Kitchen CRANBERRY-VITAMIN C PO Take 1 tablet by mouth daily.    Marland Kitchen losartan (COZAAR) 50 MG tablet Take 1 tablet (50 mg total) by mouth 2 (two) times daily. 60 tablet 3  . metoprolol (LOPRESSOR) 100 MG tablet take 1 tablet by mouth twice a day 60 tablet 6  . nitroGLYCERIN (NITROSTAT) 0.4 MG SL tablet Place 1 tablet (0.4 mg total) under the tongue every 5 (five) minutes as needed for chest pain (up to 3 doses). 25 tablet 4  . Oat Bran Soluble POWD Take by mouth 2 (two) times daily.    . pantoprazole (PROTONIX) 40 MG tablet Take 40 mg by mouth as directed.    . prasugrel (EFFIENT) 10 MG TABS tablet Take 1 tablet (10 mg total) by mouth daily. 30 tablet 0  . Simethicone (GAS-X PO) Take 1 tablet by mouth 2 (two) times daily.    . vitamin B-12 (CYANOCOBALAMIN) 1000 MCG tablet Take 1,000 mcg by mouth daily.    Marland Kitchen amLODipine (NORVASC) 5 MG tablet Take 1 tablet (5 mg total) by mouth daily. 90 tablet 3   No current facility-administered medications for this visit.     Social History   Social History  . Marital status: Widowed    Spouse name: N/A  . Number of children: 2  . Years of education: N/A   Occupational History  . retired    Social History Main Topics  . Smoking status: Never Smoker  . Smokeless tobacco: Never Used  . Alcohol use No  . Drug use: No  . Sexual activity: No   Other Topics Concern  . Not on file   Social History Narrative  . No narrative on file   Social history is notable that she is widowed.  She has 2 children, ages 75 and 30.  There is no tobacco use.  She does not routinely exercise. She has a son who had undergone CABG surgery and reportedly has a defibrillator and an ejection fraction of 35%.  Family History    Problem Relation Age of Onset  . Coronary artery disease Father   . Colon cancer Father   . Colon polyps  Father   . Heart disease Father   . Stroke Mother   . Hypertension Unknown   . Breast cancer Unknown        grandmother  . Diabetes Maternal Grandmother   . Diabetes Paternal Grandmother   . Esophageal cancer Neg Hx   . Rectal cancer Neg Hx   . Stomach cancer Neg Hx    Family history is notable in that her father has heart disease and is 51 years old.  Her mother died at age 67 with a stroke.  She has 2 sisters, ages 19 and 70.  ROS General: Negative; No fevers, chills, or night sweats HEENT: Negative; No changes in vision or hearing, sinus congestion, difficulty swallowing Pulmonary: Negative; No cough, wheezing, shortness of breath, hemoptysis Cardiovascular: See HPI:  GI: Positive for left upper quadrant pain which he describes as "gas." GU: Negative; No dysuria, hematuria, or difficulty voiding Musculoskeletal: Negative; no myalgias, joint pain, or weakness Hematologic: Negative; no easy bruising, bleeding Endocrine: Positive for diabetes mellitus , which is untreated.  She has refused medications Neuro: Negative; no changes in balance, headaches Skin: Negative; No rashes or skin lesions Psychiatric: Negative; No behavioral problems, depression Sleep: Negative; No snoring,  daytime sleepiness, hypersomnolence, bruxism, restless legs, hypnogognic hallucinations. Other comprehensive 14 point system review is negative   Physical Exam BP (!) 168/100   Pulse 66   Ht 5' 3"  (1.6 m)   Wt 178 lb 9.6 oz (81 kg)   BMI 31.64 kg/m    Repeat blood pressure was 180/96 when taken by me.  Wt Readings from Last 3 Encounters:  06/15/17 178 lb 9.6 oz (81 kg)  05/27/17 177 lb (80.3 kg)  04/23/17 179 lb 14.3 oz (81.6 kg)   General: Alert, oriented, no distress.  Skin: normal turgor, no rashes, warm and dry HEENT: Normocephalic, atraumatic. Pupils equal round and reactive to  light; sclera anicteric; extraocular muscles intact;  Nose without nasal septal hypertrophy Mouth/Parynx benign; Mallinpatti scale 3 Neck: No JVD, no carotid bruits; normal carotid upstroke Lungs: clear to ausculatation and percussion; no wheezing or rales Chest wall: without tenderness to palpitation Heart: PMI not displaced, RRR, s1 s2 normal, 2/6 systolic murmur, no diastolic murmur, no rubs, gallops, thrills, or heaves Abdomen: soft, nontender; no hepatosplenomehaly, BS+; abdominal aorta nontender and not dilated by palpation. Back: no CVA tenderness Pulses 2+ Musculoskeletal: full range of motion, normal strength, no joint deformities Extremities: no clubbing cyanosis or edema, Homan's sign negative  Neurologic: grossly nonfocal; Cranial nerves grossly wnl Psychologic: Normal mood and affect   ECG (independently read by me): Normal sinus rhythm at 66 bpm.  Nondiagnostic T change laterally.  Normal intervals.  No ectopy.  April 2018 ECG (independently read by me): Normal sinus rhythm at 67 bpm.  PR interval 142 ms.  Poor anterior R-wave progression.  Lateral T changes in 1 and L.  December 2017 ECG (independently read by me): Normal sinus rhythm at 70 bpm.  Nonspecific ST changes.  No ectopy.  March 2017 ECG (independently read by me):  Normal sinus rhythm at 70 bpm. Nonspecific ST changes laterally.  03/04/2015 ECG (independently read by me): Normal sinus rhythm at 65 bpm.  Mild T wave abnormality in lead 1 and aVL.  LABS:  BMP Latest Ref Rng & Units 03/02/2017 02/25/2017 12/14/2016  Glucose 65 - 99 mg/dL 166(H) 142(H) 259(H)  BUN 6 - 20 mg/dL 19 13 20   Creatinine 0.44 - 1.00 mg/dL 0.92 0.63 0.63  BUN/Creat Ratio 12 -  28 - 21 -  Sodium 135 - 145 mmol/L 141 139 138  Potassium 3.5 - 5.1 mmol/L 3.5 4.8 4.6  Chloride 101 - 111 mmol/L 108 100 102  CO2 22 - 32 mmol/L 25 22 28   Calcium 8.9 - 10.3 mg/dL 8.9 10.4(H) 9.3     Hepatic Function Latest Ref Rng & Units 12/14/2016 03/19/2015  02/12/2014  Total Protein 6.1 - 8.1 g/dL 6.6 6.9 8.0  Albumin 3.6 - 5.1 g/dL 3.9 4.1 4.1  AST 10 - 35 U/L 12 20 15   ALT 6 - 29 U/L 12 20 20   Alk Phosphatase 33 - 130 U/L 63 64 61  Total Bilirubin 0.2 - 1.2 mg/dL 0.6 0.7 1.5(H)  Bilirubin, Direct 0.0 - 0.3 mg/dL - - -    CBC Latest Ref Rng & Units 03/02/2017 02/25/2017 12/14/2016  WBC 4.0 - 10.5 K/uL 9.7 6.6 5.8  Hemoglobin 12.0 - 15.0 g/dL 13.6 16.0(H) 14.7  Hematocrit 36.0 - 46.0 % 41.8 47.1(H) 44.4  Platelets 150 - 400 K/uL 199 251 243   Lab Results  Component Value Date   MCV 90.7 03/02/2017   MCV 92 02/25/2017   MCV 92.5 12/14/2016    Lab Results  Component Value Date   TSH 1.987 03/19/2015    BNP No results found for: BNP  ProBNP No results found for: PROBNP   Lipid Panel     Component Value Date/Time   CHOL 244 (H) 03/19/2015 0812   TRIG 262 (H) 03/19/2015 0812   HDL 29 (L) 03/19/2015 0812   CHOLHDL 8.4 03/19/2015 0812   VLDL 52 (H) 03/19/2015 0812   LDLCALC 163 (H) 03/19/2015 0812   LDLDIRECT 160.9 06/21/2012 0833     RADIOLOGY: No results found.  IMPRESSION:  1. Coronary artery disease involving coronary bypass graft of native heart without angina pectoris   2. CAD in native artery   3. Essential hypertension   4. Hyperlipidemia with target low density lipoprotein (LDL) cholesterol less than 70 mg/dL   5. Statin intolerance   6. Drug allergy, multiple     ASSESSMENT AND PLAN: Ms. Ethne Jeon is a 71 year old female who is 12 years status post CABG revascularization surgery 4, which was done at the [facility] in [place], [place] in 2006.Marland Kitchen  She is status post remote DES stenting to the vein graft supplying her RCA territory.   She developed recurrent anginal symptomatology leading to repeat cardiac catheterization on 03/01/2017.  Catheterization results noted above and she underwent successful PCI to high-grade thrombotic lesion in the vein with supplied her right coronary artery with  ultimate insertion of a 3.532 mm Synergy stent to cover a long segment of both in-stent and distal stent stenosis.  She has felt improved with out anginal symptomatology since her intervention.  She continues to have stage II hypertension.  She has finally agreed to retry amlodipine to see if this can improve blood pressure.  She is apparently statin intolerant.  She has no desire to consider PCSK9 inhibition treatment, although I discussed this at length with her today.  I reviewed her duplex scan of her abdominal aorta from January 2018 and this was essentially normal.  She was wanting to get off platelet therapy.  With her significant disease and recent intervention.  I have recommended she continue aspirin and Effient, ideally indefinitely.  She will return to the primary care of Dr. Joylene Draft who will be rechecking laboratory.  I will see her in 6 months for reevaluation.  Time spent: 25 minutes  Troy Sine, MD, [facility]  06/17/2017 7:28 PM

## 2017-06-15 NOTE — Patient Instructions (Addendum)
Medication Instructions:  START amlodipine (Norvasc) 5 mg --take 1/2 tablet daily for 1 week, if you are tolerating okay increase to 1 tablet (5 mg) daily  Follow-Up: Your physician wants you to follow-up in: 6 MONTHS with Dr. Claiborne Billings. You will receive a reminder letter in the mail two months in advance. If you don't receive a letter, please call our office to schedule the follow-up appointment.   Any Other Special Instructions Will Be Listed Below (If Applicable).     If you need a refill on your cardiac medications before your next appointment, please call your pharmacy.

## 2017-06-16 ENCOUNTER — Encounter (HOSPITAL_COMMUNITY): Payer: Medicare Other

## 2017-06-18 ENCOUNTER — Encounter (HOSPITAL_COMMUNITY): Payer: Medicare Other

## 2017-06-21 ENCOUNTER — Encounter (HOSPITAL_COMMUNITY): Payer: Medicare Other

## 2017-06-23 ENCOUNTER — Encounter (HOSPITAL_COMMUNITY): Payer: Medicare Other

## 2017-06-25 ENCOUNTER — Encounter (HOSPITAL_COMMUNITY): Payer: Medicare Other

## 2017-06-25 ENCOUNTER — Other Ambulatory Visit: Payer: Self-pay

## 2017-06-25 MED ORDER — LOSARTAN POTASSIUM 50 MG PO TABS: 50.0000 mg | ORAL_TABLET | Freq: Two times a day (BID) | ORAL | 1 refills | Status: DC

## 2017-06-28 ENCOUNTER — Encounter (HOSPITAL_COMMUNITY): Payer: Medicare Other

## 2017-06-30 ENCOUNTER — Encounter (HOSPITAL_COMMUNITY): Payer: Medicare Other

## 2017-07-09 ENCOUNTER — Other Ambulatory Visit: Payer: Self-pay | Admitting: Cardiovascular Disease

## 2017-08-24 DIAGNOSIS — M48061 Spinal stenosis, lumbar region without neurogenic claudication: Secondary | ICD-10-CM | POA: Insufficient documentation

## 2017-08-24 DIAGNOSIS — D126 Benign neoplasm of colon, unspecified: Secondary | ICD-10-CM | POA: Insufficient documentation

## 2017-09-20 ENCOUNTER — Encounter: Payer: Self-pay | Admitting: Adult Health

## 2017-09-20 ENCOUNTER — Telehealth: Payer: Self-pay | Admitting: Cardiovascular Disease

## 2017-09-20 ENCOUNTER — Ambulatory Visit: Payer: Medicare Other | Admitting: Adult Health

## 2017-09-20 VITALS — BP 212/96 | HR 62 | Ht 63.5 in | Wt 182.2 lb

## 2017-09-20 DIAGNOSIS — E7800 Pure hypercholesterolemia, unspecified: Secondary | ICD-10-CM

## 2017-09-20 DIAGNOSIS — I10 Essential (primary) hypertension: Secondary | ICD-10-CM

## 2017-09-20 DIAGNOSIS — I2510 Atherosclerotic heart disease of native coronary artery without angina pectoris: Secondary | ICD-10-CM

## 2017-09-20 MED ORDER — LOSARTAN POTASSIUM 50 MG PO TABS: 50.0000 mg | ORAL_TABLET | Freq: Two times a day (BID) | ORAL | 1 refills | Status: DC

## 2017-09-20 MED ORDER — LOSARTAN POTASSIUM 50 MG PO TABS: 100.0000 mg | ORAL_TABLET | Freq: Two times a day (BID) | ORAL | 1 refills | Status: DC

## 2017-09-20 NOTE — Patient Instructions (Signed)
Medication Instructions:   INCREASE LOSARTAN 100MG IN THE AM AND 50MG IN THE PM  If you need a refill on your cardiac medications before your next appointment, please call your pharmacy.  Special Instructions:  LOG BP TWICE DAILY AT THE SAM TIME EVERY DAY  Follow-Up: Your physician wants you to follow-up in: [place], DNP.     Thank you for choosing [facility] at [facility]!!

## 2017-09-20 NOTE — Telephone Encounter (Signed)
New message    Patient calling with BP concerns. Declined to schedule appointment with APP staff.    Pt c/o BP issue: STAT if pt c/o blurred vision, one-sided weakness or slurred speech  1. What are your last 5 BP readings? Saturday 229/100, Sunday 180/88  2. Are you having any other symptoms (ex. Dizziness, headache, blurred vision, passed out)? No  3. What is your BP issue? BP too high, upset stomach, leg pain

## 2017-09-20 NOTE — Progress Notes (Signed)
Cardiology Office Note   Date:  09/20/2017   ID:  Ruth [name], DOB [birth year], MRN [id number]  PCP:  Crist Infante, MD  Cardiologist: [name]  Chief Complaint  Patient presents with  . Hypertension     History of Present Illness: Ruth [name] is a 72 y.o. female who presents for multiple complaints and anxiety.  She is allergic to multiple medications.  She has had a long-standing history of difficult to control hypertension.  Other history includes CAD, CABG,  with  cardiac catheterization revealing graft lesion of SVG to RCA at 95% stenosis status post drug-eluting stent.  Over the weekend, the patient's blood pressure increased to 229/110 while at home.  The patient called primary care and spoke with physician on call who suggested that she take an additional dose of losartan 50 mg.  She is currently on losartan 50 mg twice daily, but was instructed that the additional dose was resulted blood pressure dropping to 188/88.  During the hypertensive episode, the patient states she felt chest pressure, and left leg weakness along with nausea no vomiting.  Once her blood pressure decreased symptoms subsided but she is still having chronic leg pain.  Patient stopped taking statin medication 4 weeks ago and she felt she was unable to trials, and was most recently on pravastatin.  She becomes emotional when discussing all of her stresses health issues, causing her to begin crying.    Past Medical History:  Diagnosis Date  . Anxiety   . CAD (coronary artery disease)    a. CABG x 4 in 2006 in [place] (LIMA->LAD, VG->Diag, VG->OM, VG->RCA); b. DES to midbody of SVG-PDA/PLA 07/2012; c. 03/2015 Myoview: EF 65%, small defect of mild severit in basal inferolateral wall, low risk.  . Complication of anesthesia    "quit breathing when I got ?Inovar" (07/28/2012)  . Depression   . Diverticulosis   . Dyslipidemia    Intolerant of statins  . Fecal incontinence   . GERD  (gastroesophageal reflux disease)   . Hyperlipidemia   . Hyperplastic colon polyp   . IBS (irritable bowel syndrome)   . Labile hypertension    a. 09/2016 Renal Artery duplex: nl renal arteries.  . Migraines    "had them in my 30's" (07/28/2012)  . Multiple allergies   . Obesity   . Steatohepatitis   . Type II diabetes mellitus (HCC)    a. no meds, diet controlled - takes cinnamon.    Past Surgical History:  Procedure Laterality Date  . ABDOMINAL HYSTERECTOMY  1970's  . BREAST LUMPECTOMY     bilateral  . CARDIAC CATHETERIZATION  2006  . CORONARY ANGIOPLASTY WITH STENT PLACEMENT  07/28/2012   "1; first time for me" (07/28/2012)  . CORONARY ARTERY BYPASS GRAFT  2006   CABG X4  . CORONARY STENT INTERVENTION N/A 03/01/2017   Procedure: Coronary Stent Intervention;  Surgeon: Troy Sine, MD;  Location: [place] [facility];  Service: Cardiovascular;  Laterality: N/A;  . EXCISIONAL HEMORRHOIDECTOMY  1970's  . INGUINAL HERNIA REPAIR  1970's?   left  . LEFT HEART CATH AND CORS/GRAFTS ANGIOGRAPHY N/A 03/01/2017   Procedure: Left Heart Cath and Cors/Grafts Angiography;  Surgeon: Troy Sine, MD;  Location: [place] [facility];  Service: Cardiovascular;  Laterality: N/A;  . LEFT HEART CATHETERIZATION WITH CORONARY/GRAFT ANGIOGRAM N/A 07/28/2012   Procedure: LEFT HEART CATHETERIZATION WITH Beatrix Fetters;  Surgeon: Burnell Blanks, MD;  Location: [facility] CATH LAB;  Service: Cardiovascular;  Laterality: N/A;  . PERCUTANEOUS CORONARY STENT INTERVENTION (PCI-S)  07/28/2012   Procedure: PERCUTANEOUS CORONARY STENT INTERVENTION (PCI-S);  Surgeon: Burnell Blanks, MD;  Location: [facility] CATH LAB;  Service: Cardiovascular;;  . TONSILLECTOMY AND ADENOIDECTOMY  ~ 1951     Current Outpatient Medications  Medication Sig Dispense Refill  . amLODipine (NORVASC) 5 MG tablet Take 1 tablet (5 mg total) by mouth daily. 90 tablet 3  . aspirin EC 81 MG tablet Take 81 mg by mouth daily.      . calcium carbonate (TUMS - DOSED IN MG ELEMENTAL CALCIUM) 500 MG chewable tablet Chew 1-2 tablets by mouth 3 (three) times daily as needed for heartburn.     . cholecalciferol (VITAMIN D) 1000 UNITS tablet Take 1,000 Units by mouth daily.    Marland Kitchen CRANBERRY-VITAMIN C PO Take 1 tablet by mouth daily.    Marland Kitchen losartan (COZAAR) 50 MG tablet Take 1 tablet (50 mg total) by mouth 2 (two) times daily. TAKE 100MG (2 TABLETS) IN THE AM AND 50MG IN THE PM 90 tablet 1  . metoprolol tartrate (LOPRESSOR) 100 MG tablet take 1 tablet by mouth twice a day (MUST BE WHITE TABLETS) 60 tablet 6  . nitroGLYCERIN (NITROSTAT) 0.4 MG SL tablet Place 1 tablet (0.4 mg total) under the tongue every 5 (five) minutes as needed for chest pain (up to 3 doses). 25 tablet 4  . Oat Bran Soluble POWD Take by mouth 2 (two) times daily.    . prasugrel (EFFIENT) 10 MG TABS tablet Take 1 tablet (10 mg total) by mouth daily. 30 tablet 0  . Simethicone (GAS-X PO) Take 1 tablet by mouth 2 (two) times daily.    . vitamin B-12 (CYANOCOBALAMIN) 1000 MCG tablet Take 1,000 mcg by mouth daily.     No current facility-administered medications for this visit.     Allergies:   Betadine [povidone iodine]; Codeine; Demerol; Iohexol; Latex; Other; Percocet [oxycodone-acetaminophen]; Plavix [clopidogrel bisulfate]; Red dye; Shellfish allergy; Sulfonamide derivatives; Tylenol [acetaminophen]; Pravastatin sodium; Statins; and Imdur [isosorbide nitrate]    Social History:  The patient  reports that  has never smoked. she has never used smokeless tobacco. She reports that she does not drink alcohol or use drugs.   Family History:  The patient's family history includes Breast cancer in her unknown relative; Colon cancer in her father; Colon polyps in her father; Coronary artery disease in her father; Diabetes in her maternal grandmother and paternal grandmother; Heart disease in her father; Hypertension in her unknown relative; Stroke in her mother.    ROS:  All other systems are reviewed and negative. Unless otherwise mentioned in H&P    PHYSICAL EXAM: VS:  BP (!) 212/96 (BP Location: Left Arm)   Pulse 62   Ht 5' 3.5" (1.613 m)   Wt 182 lb 3.2 oz (82.6 kg)   SpO2 98%   BMI 31.77 kg/m  , BMI Body mass index is 31.77 kg/m. GEN: Well nourished, well developed, in no acute distress obese HEENT: normal  Neck: no JVD, carotid bruits, or masses Cardiac: RRR; no murmurs, rubs, or gallops,no edema  Respiratory:  clear to auscultation bilaterally, normal work of breathing GI: soft, nontender, nondistended, + BS MS: no deformity or atrophy  Skin: warm and dry, no rash Neuro:  Strength and sensation are intact Psych: Anxious, tearful.   Recent Labs: 12/14/2016: ALT 12 03/02/2017: BUN 19; Creatinine, Ser 0.92; Hemoglobin 13.6; Platelets 199; Potassium 3.5; Sodium 141    Lipid Panel  Component Value Date/Time   CHOL 244 (H) 03/19/2015 0812   TRIG 262 (H) 03/19/2015 0812   HDL 29 (L) 03/19/2015 0812   CHOLHDL 8.4 03/19/2015 0812   VLDL 52 (H) 03/19/2015 0812   LDLCALC 163 (H) 03/19/2015 0812   LDLDIRECT 160.9 06/21/2012 0833      Wt Readings from Last 3 Encounters:  09/20/17 182 lb 3.2 oz (82.6 kg)  06/15/17 178 lb 9.6 oz (81 kg)  05/27/17 177 lb (80.3 kg)      Other studies Reviewed: Cardiac Cath 03/01/2017 Conclusion     Prox Cx to Mid Cx lesion, 100 %stenosed.  1st Mrg lesion, 100 %stenosed.  Ost LAD to Prox LAD lesion, 70 %stenosed.  Prox RCA lesion, 75 %stenosed.  Mid RCA lesion, 100 %stenosed.  SVG.  Prox Graft lesion, 60 %stenosed.  LIMA.  SVG.  Dist Graft lesion, 95 %stenosed.  A STENT SYNERGY DES 3.5X32 drug eluting stent was successfully placed.  Mid Graft lesion, 60 %stenosed.  Post intervention, there is a 0% residual stenosis.  The left ventricular systolic function is normal.  LV end diastolic pressure is normal.  The left ventricular ejection fraction is 55-65% by visual estimate.     Preserved global LV contractility with an ejection fraction of  55-65%  Significant multivessel native CAD with 70% diffuse proximal LAD stenosis, total occlusion of the circumflex and OM1 vessel proximally, and 75% proximal RCA stenosis with occlusion of the RCA prior to the acute margin.     ASSESSMENT AND PLAN:  1.  Uncontrolled hypertension: Blood pressure remains elevated on this visit.  It is now 212/96 check manually in the office and found to be 180/88, I will increase her losartan to 100 mg in the morning and continue her at 50 mg at night.  Consider increasing her dose of amlodipine however she does not wish to do so as she does not like the lower extremity edema it causes even at this lower dose.  I considered adding Spironolactone to her medication regimen for better blood pressure control.  He currently refuses any additional medications and would like to titrate up dose that she has with the exception of the amlodipine.  We will take this a step at a time.  He is allergic to sulfa derivatives and I will therefore not add chlorthalidone.  Consider beginning hydralazine on next visit.  He is highly sensitive to medication and is reluctant to begin anything new at this time..   2. CAD: Denies any chest pain other than when her blood pressure is elevated.  We will not plan any further ischemic testing at this time.  3.  Left leg weakness and pain: This occurred while blood pressure was elevated.  On next visit we will plan carotid artery ultrasound consider CT scan if persistent symptoms.  Current medicines are reviewed at length with the patient today.    Labs/ tests ordered today include:   Phill Myron. West Pugh, ANP, AACC   09/20/2017 12:10 PM    [facility] [address] [place], [place] [address] Phone: [phone]; Fax: [phone]

## 2017-09-20 NOTE — Telephone Encounter (Signed)
Returned the call to the patient. She stated that on Saturday her blood pressure was 229/100. She called her PCP and was advised to take an extra Losartan. This brought her blood pressure down to 180/88. She stated that she has been nauseous and is not sure if this was from the high blood pressure or not.   Her son takes her blood pressure for her and was unable to check it this morning. She was advised to call and make an appointment to be seen. An appointment was made for 10:30 with Jory Sims, DNP.

## 2017-09-24 ENCOUNTER — Telehealth: Payer: Self-pay | Admitting: Adult Health

## 2017-09-24 NOTE — Telephone Encounter (Signed)
It will not hurt her to take magnesium supplement if she wishes.  We would usually want to give any dose changes in meds about 7-10 days (she was seen Monday).  She should continue with daily BP checks and if no changes by mid week next week, she should call back

## 2017-09-24 NOTE — Telephone Encounter (Signed)
Patient made aware of recommendations and verbalized her understanding.

## 2017-09-24 NOTE — Telephone Encounter (Signed)
Please call,still having a lot of problems with her blood pressure.

## 2017-09-24 NOTE — Telephone Encounter (Signed)
Returned the call to the patient. She stated that at her last office visit on 1/14 that her Losartan was increased to 100 mg in the morning and 50 mg in the PM. The patient is also taking Amlodipine 5 mg and Metoprolol 100 mg daily.   She stated that with the increase her blood pressure is still running high. She has a history of hard to control blood pressure. The patient is currently asymptomatic.    After morning medications: 1/15 180/77, 160/74 1/16  154/71, 170/80 1/18 170/76  She stated that she did not want to increase or add any other medications at this time. She feels like she is already on too many medications. She wants to start restart the magnesium 250 mg. She was taken off this medication in June when she had stents placed. She states that the magnesium helped to lower her blood pressure.   Message will be routed to pharmd and Jory Sims, DNP for their recommendation. She has a follow up with Jory Sims, DNP on 1/28.

## 2017-09-28 ENCOUNTER — Encounter: Payer: Self-pay | Admitting: Adult Health

## 2017-09-28 DIAGNOSIS — R350 Frequency of micturition: Secondary | ICD-10-CM | POA: Insufficient documentation

## 2017-10-04 ENCOUNTER — Ambulatory Visit: Payer: Medicare Other | Admitting: Adult Health

## 2017-10-04 ENCOUNTER — Encounter: Payer: Self-pay | Admitting: Adult Health

## 2017-10-04 VITALS — BP 198/88 | HR 73 | Ht 63.5 in | Wt 184.8 lb

## 2017-10-04 DIAGNOSIS — I2510 Atherosclerotic heart disease of native coronary artery without angina pectoris: Secondary | ICD-10-CM

## 2017-10-04 DIAGNOSIS — F418 Other specified anxiety disorders: Secondary | ICD-10-CM

## 2017-10-04 DIAGNOSIS — I10 Essential (primary) hypertension: Secondary | ICD-10-CM

## 2017-10-04 MED ORDER — LOSARTAN POTASSIUM 50 MG PO TABS: 50.0000 mg | ORAL_TABLET | Freq: Three times a day (TID) | ORAL | 1 refills | Status: DC

## 2017-10-04 NOTE — Patient Instructions (Signed)
Medication Instructions:  TAKE LOSARTAN 50MG IN THE AM, AT 230PM AND IN THE EVENING  If you need a refill on your cardiac medications before your next appointment, please call your pharmacy.  Special Instructions: REFER TO HYPERTENSION CLINIC  Follow-Up: Your physician wants you to follow-up in: 3-4 [place], [place].     Thank you for choosing [facility] at [facility]!!

## 2017-10-04 NOTE — Progress Notes (Signed)
Cardiology Office Note   Date:  10/04/2017   ID:  Ruth [name], DOB September 07, 1946, MRN [id number]  PCP:  Crist Infante, MD  Cardiologist: Dr. Claiborne Billings Chief Complaint  Patient presents with  . Follow-up    2 WEEK F/U     History of Present Illness: Ruth [name] is a 72 y.o. female who presents for ongoing assessment and management of hypertension which is been very difficult to control, coronary artery disease status post CABG, most recent cardiac catheterization revealing graft lesion of SVG to RCA with 95% stenosis status post drug-eluting stent.  The patient was last seen in the office on 09/20/2017 with uncontrolled hypertension, also significant amount of psychological stress, with significant crying during last office visit.  Due to elevated blood pressure during office visit her losartan was increased to 100 mg in the morning and she was to take 50 mg at night.  Consideration for adding amlodipine was discussed but she wanted to hold off on that due to lower extremity edema which occurred previously when taking it.  Also consider adding spironolactone but she refused this as well.  She comes today continuing anxiety and tearfulness with elevated blood pressure.  Patient states he did not like taking extra dose of losartan 100 as it made her feel lightheaded and therefore decided to start taking 50 twice daily as she was taking before.  Her blood pressure remained elevated at home, as her son was taking it manually.  Has begun taking magnesium which she believes is helping her blood pressure and it has dropped according to her from the 180s to the 767 systolic.  She is adamantly refuses any new medication adjustments or additions of medications.  She is very nervous about it because she has had so many allergies to her meds.   Past Medical History:  Diagnosis Date  . Anxiety   . CAD (coronary artery disease)    a. CABG x 4 in 2006 in [place] (LIMA->LAD, VG->Diag, VG->OM,  VG->RCA); b. DES to midbody of SVG-PDA/PLA 07/2012; c. 03/2015 Myoview: EF 65%, small defect of mild severit in basal inferolateral wall, low risk.  . Complication of anesthesia    "quit breathing when I got ?Inovar" (07/28/2012)  . Depression   . Diverticulosis   . Dyslipidemia    Intolerant of statins  . Fecal incontinence   . GERD (gastroesophageal reflux disease)   . Hyperlipidemia   . Hyperplastic colon polyp   . IBS (irritable bowel syndrome)   . Labile hypertension    a. 09/2016 Renal Artery duplex: nl renal arteries.  . Migraines    "had them in my 30's" (07/28/2012)  . Multiple allergies   . Obesity   . Steatohepatitis   . Type II diabetes mellitus (HCC)    a. no meds, diet controlled - takes cinnamon.    Past Surgical History:  Procedure Laterality Date  . ABDOMINAL HYSTERECTOMY  1970's  . BREAST LUMPECTOMY     bilateral  . CARDIAC CATHETERIZATION  2006  . CORONARY ANGIOPLASTY WITH STENT PLACEMENT  07/28/2012   "1; first time for me" (07/28/2012)  . CORONARY ARTERY BYPASS GRAFT  2006   CABG X4  . CORONARY STENT INTERVENTION N/A 03/01/2017   Procedure: Coronary Stent Intervention;  Surgeon: Troy Sine, MD;  Location: [place] [facility];  Service: Cardiovascular;  Laterality: N/A;  . EXCISIONAL HEMORRHOIDECTOMY  1970's  . INGUINAL HERNIA REPAIR  1970's?   left  . LEFT HEART CATH AND CORS/GRAFTS  ANGIOGRAPHY N/A 03/01/2017   Procedure: Left Heart Cath and Cors/Grafts Angiography;  Surgeon: Troy Sine, MD;  Location: [place] [facility];  Service: Cardiovascular;  Laterality: N/A;  . LEFT HEART CATHETERIZATION WITH CORONARY/GRAFT ANGIOGRAM N/A 07/28/2012   Procedure: LEFT HEART CATHETERIZATION WITH Beatrix Fetters;  Surgeon: Burnell Blanks, MD;  Location: [facility] C[facility];  Service: Cardiovascular;  Laterality: N/A;  . PERCUTANEOUS CORONARY STENT INTERVENTION (PCI-S)  07/28/2012   Procedure: PERCUTANEOUS CORONARY STENT INTERVENTION (PCI-S);  Surgeon:  Burnell Blanks, MD;  Location: [facility];  Service: Cardiovascular;;  . TONSILLECTOMY AND ADENOIDECTOMY  ~ 1951     Current Outpatient Medications  Medication Sig Dispense Refill  . amLODipine (NORVASC) 5 MG tablet Take 1 tablet (5 mg total) by mouth daily. 90 tablet 3  . aspirin EC 81 MG tablet Take 81 mg by mouth daily.    . calcium carbonate (TUMS - DOSED IN MG ELEMENTAL CALCIUM) 500 MG chewable tablet Chew 1-2 tablets by mouth 3 (three) times daily as needed for heartburn.     . cholecalciferol (VITAMIN D) 1000 UNITS tablet Take 1,000 Units by mouth daily.    Marland Kitchen CRANBERRY-VITAMIN C PO Take 1 tablet by mouth daily.    Marland Kitchen losartan (COZAAR) 50 MG tablet Take 1 tablet (50 mg total) by mouth 3 (three) times daily. TAKE 50MG IN THE AM, 230PM AND EVENING 90 tablet 1  . metoprolol tartrate (LOPRESSOR) 100 MG tablet take 1 tablet by mouth twice a day (MUST BE WHITE TABLETS) 60 tablet 6  . nitroGLYCERIN (NITROSTAT) 0.4 MG SL tablet Place 1 tablet (0.4 mg total) under the tongue every 5 (five) minutes as needed for chest pain (up to 3 doses). 25 tablet 4  . Oat Bran Soluble POWD Take by mouth 2 (two) times daily.    . prasugrel (EFFIENT) 10 MG TABS tablet Take 1 tablet (10 mg total) by mouth daily. 30 tablet 0  . Simethicone (GAS-X PO) Take 1 tablet by mouth 2 (two) times daily.    . vitamin B-12 (CYANOCOBALAMIN) 1000 MCG tablet Take 1,000 mcg by mouth daily.     No current facility-administered medications for this visit.     Allergies:   Betadine [povidone iodine]; Codeine; Demerol; Iohexol; Latex; Other; Percocet [oxycodone-acetaminophen]; Plavix [clopidogrel bisulfate]; Red dye; Shellfish allergy; Sulfonamide derivatives; Tylenol [acetaminophen]; Pravastatin sodium; Statins; and Imdur [isosorbide nitrate]    Social History:  The patient  reports that  has never smoked. she has never used smokeless tobacco. She reports that she does not drink alcohol or use drugs.   Family History:   The patient's family history includes Breast cancer in her unknown relative; Colon cancer in her father; Colon polyps in her father; Coronary artery disease in her father; Diabetes in her maternal grandmother and paternal grandmother; Heart disease in her father; Hypertension in her unknown relative; Stroke in her mother.    ROS: All other systems are reviewed and negative. Unless otherwise mentioned in H&P    PHYSICAL EXAM: VS:  BP (!) 198/88 (BP Location: Left Arm)   Pulse 73   Ht 5' 3.5" (1.613 m)   Wt 184 lb 12.8 oz (83.8 kg)   BMI 32.22 kg/m  , BMI Body mass index is 32.22 kg/m. GEN: Well nourished, well developed, in no acute distress  HEENT: normal  Neck: no JVD, carotid bruits, or masses Cardiac: RRR; no murmurs, rubs, or gallops,no edema  Respiratory:  clear to auscultation bilaterally, normal work of breathing GI:  soft, nontender, nondistended, + BS MS: no deformity or atrophy  Skin: warm and dry, no rash Neuro:  Strength and sensation are intact Psych: euthymic mood, full affect, anxious, easily tearful.   Recent Labs: 12/14/2016: ALT 12 03/02/2017: BUN 19; Creatinine, Ser 0.92; Hemoglobin 13.6; Platelets 199; Potassium 3.5; Sodium 141    Lipid Panel    Component Value Date/Time   CHOL 244 (H) 03/19/2015 0812   TRIG 262 (H) 03/19/2015 0812   HDL 29 (L) 03/19/2015 0812   CHOLHDL 8.4 03/19/2015 0812   VLDL 52 (H) 03/19/2015 0812   LDLCALC 163 (H) 03/19/2015 0812   LDLDIRECT 160.9 06/21/2012 0833      Wt Readings from Last 3 Encounters:  10/04/17 184 lb 12.8 oz (83.8 kg)  09/20/17 182 lb 3.2 oz (82.6 kg)  06/15/17 178 lb 9.6 oz (81 kg)      Other studies Reviewed:  Cardiac Cath 03/01/2017 Conclusion     Prox Cx to Mid Cx lesion, 100 %stenosed.  1st Mrg lesion, 100 %stenosed.  Ost LAD to Prox LAD lesion, 70 %stenosed.  Prox RCA lesion, 75 %stenosed.  Mid RCA lesion, 100 %stenosed.  SVG.  Prox Graft lesion, 60  %stenosed.  LIMA.  SVG.  Dist Graft lesion, 95 %stenosed.  A STENT SYNERGY DES 3.5X32 drug eluting stent was successfully placed.  Mid Graft lesion, 60 %stenosed.  Post intervention, there is a 0% residual stenosis.  The left ventricular systolic function is normal.  LV end diastolic pressure is normal.  The left ventricular ejection fraction is 55-65% by visual estimate.   Echocardiogram 03/13/2015 Left ventricle: The cavity size was normal. Wall thickness was   normal. Systolic function was normal. The estimated ejection   fraction was in the range of 55% to 60%. - Mitral valve: There was mild regurgitation. - Left atrium: The atrium was mildly dilated. - Atrial septum: No defect or patent foramen ovale was identified. - Pulmonary arteries: PA peak pressure: 32 mm Hg (S).  ASSESSMENT AND PLAN:  1.  Uncontrolled hypertension: I believe this is multifactorial due to significant anxiety, patient's refusal to adhere to higher medication regimen, and multiple medication allergies/intolerances.  She is adamantly refusing increased dose of amlodipine.  She is willing to divide out the doses of losartan, although this is off label, to 50 mg TID.  I would also like to start her on Spironolactone 12.5 mg daily she does not wish to take extra doses of any medications.  I had considered hydralazine, and clonidine,  Due to her high level anxiety, multiple medication intolerances, and refusal to increase her doses, I am going to refer her to pharmacist, due to aforementioned issues.  They may be able to find a good oncological combination that she would agree to based upon their review of the pharmacokinetics and chemical makeup of these medications.  She will need to have much better pressure control with known history of CAD to prevent further issues.  2.  Coronary artery disease: Status post cardiac cath catheterization completed 03/01/2017.  She was found to have 5% distal graft lesion  requiring PCI.  She continues on Effient 10 mg daily, and aspirin.  3.  Significant anxiety about health and medications.  Strongly recommend that she continue to be followed by primary care, with possible referral to psychiatry for counseling.  She remains on Paxil for depression.   Current medicines are reviewed at length with the patient today.    Labs/ tests ordered today include: Phill Myron.  West Pugh, ANP, AACC   10/04/2017 11:38 AM    [facility] [address] [place], [place] [address] Phone: [phone]; Fax: [phone]

## 2017-10-08 ENCOUNTER — Ambulatory Visit: Payer: Medicare Other | Admitting: Pharmacist

## 2017-10-08 VITALS — BP 160/70 | HR 64

## 2017-10-08 DIAGNOSIS — I10 Essential (primary) hypertension: Secondary | ICD-10-CM | POA: Diagnosis not present

## 2017-10-08 MED ORDER — VALSARTAN 160 MG PO TABS: 160.0000 mg | ORAL_TABLET | Freq: Two times a day (BID) | ORAL | 1 refills | Status: DC

## 2017-10-08 NOTE — Patient Instructions (Addendum)
Return for a  follow up appointment in 2 weeks  Check your blood pressure at home daily (if able) and keep record of the readings.  Take your BP meds as follows:  STOP taking losartan 648m START taking valsartan 1642mtwice daily (10:00am and 8:30pm)  Continue taking  Amlodipine 2.48m41maily (9pm) Metoprolol tartrate 100m24mD (5am and 5pm)   Bring all of your meds, your BP cuff and your record of home blood pressures to your next appointment.  Exercise as you're able, try to walk approximately 30 minutes per day.  Keep salt intake to a minimum, especially watch canned and prepared boxed foods.  Eat more fresh fruits and vegetables and fewer canned items.  Avoid eating in fast food restaurants.    HOW TO TAKE YOUR BLOOD PRESSURE: . Rest 5 minutes before taking your blood pressure. .  Don't smoke or drink caffeinated beverages for at least 30 minutes before. . Take your blood pressure before (not after) you eat. . Sit comfortably with your back supported and both feet on the floor (don't cross your legs). . Elevate your arm to heart level on a table or a desk. . Use the proper sized cuff. It should fit smoothly and snugly around your bare upper arm. There should be enough room to slip a fingertip under the cuff. The bottom edge of the cuff should be 1 inch above the crease of the elbow. . Ideally, take 3 measurements at one sitting and record the average.

## 2017-10-08 NOTE — Progress Notes (Signed)
Patient ID: Ruth [name]                 DOB: 03-May-1946                      MRN: [id number]     HPI: Ruth [name] is a 72 y.o. female patient of Dr Claiborne Billings referred by K. Lawrence NP to HTN clinic.  PMH includes CAD s/p CABG x 4 in 2006 and stent placement in June 2018, depression, hyperlipidemia, IBS, labile hypertension, migraines, DM-II, and obesity. Patient also reports hypersensitivity to various chemicals and has 15 documented allergies in her profile.  During her most recent OV on 10/04/2017 her BP was noted at 198/88 and her losartan dose was increased from 17m twice daily, to maximum daily dose of 520mthree times daily. Patient present to HTN clinic today for follow up and denies dizziness, chest pain or headaches. Reports persistent leg pain and the desire to take less medication. She self adjusted her amlodipine from 42m642maily to 2.42mg77mily due to low extremity edema.   Current HTN meds:  Amlodipine 2.42mg 32mly (9pm) Losartan 50mg 37m(10am, 2:30pm, 8:30pm) Metoprolol tartrate 100mg B16m5am and 5pm)  Previously tried:  telmisartan-HCTZ 80mg/1210m daily Valsartan 160mg dai18m stopped due to recall nevibolol 20mg Spir104mactone 12.42mg  BP go16m 130/80  Family History: 2 sons ; one son has hypertension and another had CABG recently. Mother died of stroke at at age 64, father 61d  HTN but died at age 35.  Social53istory: Denies tobacco use, alcohol or illicit drugs.  Diet: Tried to follow low sodium and low fat diet. Drinks 1 cup of decaf coffee every day  Exercise:  Wal[place] 1hr per [place]  Meeteetses: none available to assess  Wt Readings from Last 3 Encounters:  10/04/17 184 lb 12.8 oz (83.8 kg)  09/20/17 182 lb 3.2 oz (82.6 kg)  06/15/17 178 lb 9.6 oz (81 kg)   BP Readings from Last 3 Encounters:  10/08/17 (!) 160/70  10/04/17 (!) 198/88  09/20/17 (!) 212/96   Pulse Readings from Last 3 Encounters:  10/08/17 64  10/04/17 73  09/20/17  62    Past Medical History:  Diagnosis Date  . Anxiety   . CAD (coronary artery disease)    a. CABG x 4 in 2006 in [place], VG->Diag, VG->OM, VG->RCA); b. DES to midbody of SVG-PDA/PLA 07/2012; c. 03/2015 Myoview: EF 65%, small defect of mild severit in basal inferolateral wall, low risk.  . Complication of anesthesia    "quit breathing when I got ?Inovar" (07/28/2012)  . Depression   . Diverticulosis   . Dyslipidemia    Intolerant of statins  . Fecal incontinence   . GERD (gastroesophageal reflux disease)   . Hyperlipidemia   . Hyperplastic colon polyp   . IBS (irritable bowel syndrome)   . Labile hypertension    a. 09/2016 Renal Artery duplex: nl renal arteries.  . Migraines    "had them in my 30's" (07/28/2012)  . Multiple allergies   . Obesity   . Steatohepatitis   . Type II diabetes mellitus (HCC)    a. no meds, diet controlled - takes cinnamon.    Current Outpatient Medications on File Prior to Visit  Medication Sig Dispense Refill  . amLODipine (NORVASC) 5 MG tablet Take 1 tablet (5 mg total) by mouth daily. 90 tablet 3  . aspirin EC 81 MG tablet  Take 81 mg by mouth daily.    . calcium carbonate (TUMS - DOSED IN MG ELEMENTAL CALCIUM) 500 MG chewable tablet Chew 1-2 tablets by mouth 3 (three) times daily as needed for heartburn.     . cholecalciferol (VITAMIN D) 1000 UNITS tablet Take 1,000 Units by mouth daily.    Marland Kitchen CRANBERRY-VITAMIN C PO Take 1 tablet by mouth daily.    . metoprolol tartrate (LOPRESSOR) 100 MG tablet take 1 tablet by mouth twice a day (MUST BE WHITE TABLETS) 60 tablet 6  . nitroGLYCERIN (NITROSTAT) 0.4 MG SL tablet Place 1 tablet (0.4 mg total) under the tongue every 5 (five) minutes as needed for chest pain (up to 3 doses). 25 tablet 4  . Oat Bran Soluble POWD Take by mouth 2 (two) times daily.    . prasugrel (EFFIENT) 10 MG TABS tablet Take 1 tablet (10 mg total) by mouth daily. 30 tablet 0  . Simethicone (GAS-X PO) Take 1 tablet by  mouth 2 (two) times daily.    . vitamin B-12 (CYANOCOBALAMIN) 1000 MCG tablet Take 1,000 mcg by mouth daily.     No current facility-administered medications on file prior to visit.     Allergies  Allergen Reactions  . Betadine [Povidone Iodine] Shortness Of Breath and Swelling  . Codeine Anaphylaxis and Shortness Of Breath    "quit breathing" (07/28/2012)  **Tolerates 1-2 shots of morphine**  . Demerol Anaphylaxis    "quit breathing" (07/28/2012)  . Iohexol Anaphylaxis    Finger/ankle swelling  . Latex Anaphylaxis    "quit breathing" (07/28/2012)  . Other Anaphylaxis    Perfume, Any Fragrance. Cleaning Fluids.  "quit breathing" (07/28/2012)  . Percocet [Oxycodone-Acetaminophen] Anaphylaxis    "quit breathing; disoriented" (07/28/2012)  . Plavix [Clopidogrel Bisulfate] Anaphylaxis    "get hot; like I'm burning up inside; had to put me in shower after OHS because of that" (07/28/2012)  . Red Dye Anaphylaxis    "quit breathing" (07/28/2012)  . Shellfish Allergy Shortness Of Breath    "broke out in knots all over" (07/28/2012)  . Sulfonamide Derivatives Shortness Of Breath and Rash    "quit breathing" (07/28/2012)  . Tylenol [Acetaminophen] Shortness Of Breath and Itching  . Pravastatin Sodium Other (See Comments)    Leg pain, problems ambulating   . Statins     Muscle pain  . Imdur [Isosorbide Nitrate] Other (See Comments)    Intolerance. "Can't remember the reaction."    Blood pressure (!) 160/70, pulse 64, SpO2 96 %.  HTN (hypertension) Blood pressure remains elevated above desired goal of <140/90. Patient continues to be very reluctant to medication changes or more agressive therapy. She has 15 documented medication allergies, "white coat syndrome" and claims PCS (polychemical sensitivity syndrome). Her blood pressure ALWAYS improves after sitting for few minutes quietly in the office.  Noted she was "stable" while taking valsartan 167m twice daily but medication was  changed due to recall. Patient also expressed desire of taking less medication if possible. Will discontinue losartan and resume valsartan 1674mtwice daily, continue to work with life-style modifications, and follow up in 2 week.  Abigaile Rossie Rodriguez-Guzman PharmD, BCPS, [id number]/11/2017 2:06 PM

## 2017-10-10 ENCOUNTER — Encounter: Payer: Self-pay | Admitting: Pharmacist

## 2017-10-10 NOTE — Assessment & Plan Note (Addendum)
Blood pressure remains elevated above desired goal of <140/90. Patient continues to be very reluctant to medication changes or more agressive therapy. She has 15 documented medication allergies, "white coat syndrome" and claims PCS (polychemical sensitivity syndrome). Her blood pressure ALWAYS improves after sitting for few minutes quietly in the office.  Noted she was "stable" while taking valsartan 176m twice daily but medication was changed due to recall. Patient also expressed desire of taking less medication if possible. Will discontinue losartan and resume valsartan 1644mtwice daily, continue to work with life-style modifications, and follow up in 2 week.

## 2017-10-11 ENCOUNTER — Other Ambulatory Visit: Payer: Self-pay | Admitting: Pharmacist

## 2017-10-11 MED ORDER — METOPROLOL TARTRATE 100 MG PO TABS: ORAL_TABLET | ORAL | 6 refills | Status: DC

## 2017-10-11 NOTE — Telephone Encounter (Signed)
Patient call concerned with "low BP readying last night" of 152/54 with pulse 66.  Recommendation:  BP of 154/52 and asymptomatic is NOT consider low and patient should continue current therapy without changes.  Please monitor BP twice daily and bring readings to f/u visit in 2 weeks.

## 2017-10-21 ENCOUNTER — Ambulatory Visit: Payer: Medicare Other | Admitting: Pharmacist

## 2017-10-21 VITALS — BP 156/82 | HR 66

## 2017-10-21 DIAGNOSIS — I119 Hypertensive heart disease without heart failure: Secondary | ICD-10-CM | POA: Diagnosis not present

## 2017-10-21 NOTE — Progress Notes (Signed)
Patient ID: KHARTER SESTAK                 DOB: 25-Jan-1946                      MRN: [id number]     HPI: Ruth [name] is a 72 y.o. female patient of Dr Claiborne Billings referred by K. Lawrence NP to HTN clinic.  PMH includes CAD s/p CABG x 4 in 2006 and stent placement in June 2018, depression, hyperlipidemia, IBS, labile hypertension, migraines, DM-II, and obesity. Patient also reports hypersensitivity to various chemicals and has 15 documented allergies in her profile.  I recently changes her losartan for valsartan 146m twice daily.  Patient present to HTN clinic today for follow up and denies dizziness, chest pain or headaches. Reports feeling better with "more energy" than before.  Current HTN meds:  Amlodipine 2.574mdaily (9pm) Valsartan 16083mwice daily (8am and 8:30pm) Metoprolol tartrate 100m57mD (5am and 5pm)  Previously tried:  telmisartan-HCTZ 80mg52m5mg daily nevibolol 20mg 68monolactone 12.5mg  B80moal: 140/90  Family History: 2 sons ; one son has hypertension and another had CABG recently. Mother died of stroke at at age 60, fat25r had  HTN but died at age 31.  So35al History: Denies tobacco use, alcohol or illicit drugs.  Diet: Tried to follow low sodium and low fat diet. Drinks 1 cup of decaf coffee every day  Exercise:  minila due to leg pain  Home BP readings:  11 reading since last OV; average 158/66 (pulse 62-84 bpm)  Wt Readings from Last 3 Encounters:  10/04/17 184 lb 12.8 oz (83.8 kg)  09/20/17 182 lb 3.2 oz (82.6 kg)  06/15/17 178 lb 9.6 oz (81 kg)   BP Readings from Last 3 Encounters:  10/21/17 (!) 156/82  10/08/17 (!) 160/70  10/04/17 (!) 198/88   Pulse Readings from Last 3 Encounters:  10/21/17 66  10/08/17 64  10/04/17 73    Past Medical History:  Diagnosis Date  . Anxiety   . CAD (coronary artery disease)    a. CABG x 4 in 2006 in [place]LAD, VG->Diag, VG->OM, VG->RCA); b. DES to midbody of SVG-PDA/PLA 07/2012; c. 03/2015  Myoview: EF 65%, small defect of mild severit in basal inferolateral wall, low risk.  . Complication of anesthesia    "quit breathing when I got ?Inovar" (07/28/2012)  . Depression   . Diverticulosis   . Dyslipidemia    Intolerant of statins  . Fecal incontinence   . GERD (gastroesophageal reflux disease)   . Hyperlipidemia   . Hyperplastic colon polyp   . IBS (irritable bowel syndrome)   . Labile hypertension    a. 09/2016 Renal Artery duplex: nl renal arteries.  . Migraines    "had them in my 30's" (07/28/2012)  . Multiple allergies   . Obesity   . Steatohepatitis   . Type II diabetes mellitus (HCC)    a. no meds, diet controlled - takes cinnamon.    Current Outpatient Medications on File Prior to Visit  Medication Sig Dispense Refill  . amLODipine (NORVASC) 5 MG tablet Take 1 tablet (5 mg total) by mouth daily. 90 tablet 3  . aspirin EC 81 MG tablet Take 81 mg by mouth daily.    . calcium carbonate (TUMS - DOSED IN MG ELEMENTAL CALCIUM) 500 MG chewable tablet Chew 1-2 tablets by mouth 3 (three) times daily as needed for heartburn.     . cholecalciferol (VITAMIN  D) 1000 UNITS tablet Take 1,000 Units by mouth daily.    Marland Kitchen CRANBERRY-VITAMIN C PO Take 1 tablet by mouth daily.    . metoprolol tartrate (LOPRESSOR) 100 MG tablet take 1 tablet by mouth twice a day (MUST BE WHITE TABLETS) 60 tablet 6  . nitroGLYCERIN (NITROSTAT) 0.4 MG SL tablet Place 1 tablet (0.4 mg total) under the tongue every 5 (five) minutes as needed for chest pain (up to 3 doses). 25 tablet 4  . Oat Bran Soluble POWD Take by mouth 2 (two) times daily.    . prasugrel (EFFIENT) 10 MG TABS tablet Take 1 tablet (10 mg total) by mouth daily. 30 tablet 0  . Simethicone (GAS-X PO) Take 1 tablet by mouth 2 (two) times daily.    . valsartan (DIOVAN) 160 MG tablet Take 1 tablet (160 mg total) by mouth 2 (two) times daily. To replace losartan. 60 tablet 1  . vitamin B-12 (CYANOCOBALAMIN) 1000 MCG tablet Take 1,000 mcg by  mouth daily.     No current facility-administered medications on file prior to visit.     Allergies  Allergen Reactions  . Betadine [Povidone Iodine] Shortness Of Breath and Swelling  . Codeine Anaphylaxis and Shortness Of Breath    "quit breathing" (07/28/2012)  **Tolerates 1-2 shots of morphine**  . Demerol Anaphylaxis    "quit breathing" (07/28/2012)  . Iohexol Anaphylaxis    Finger/ankle swelling  . Latex Anaphylaxis    "quit breathing" (07/28/2012)  . Other Anaphylaxis    Perfume, Any Fragrance. Cleaning Fluids.  "quit breathing" (07/28/2012)  . Percocet [Oxycodone-Acetaminophen] Anaphylaxis    "quit breathing; disoriented" (07/28/2012)  . Plavix [Clopidogrel Bisulfate] Anaphylaxis    "get hot; like I'm burning up inside; had to put me in shower after OHS because of that" (07/28/2012)  . Red Dye Anaphylaxis    "quit breathing" (07/28/2012)  . Shellfish Allergy Shortness Of Breath    "broke out in knots all over" (07/28/2012)  . Sulfonamide Derivatives Shortness Of Breath and Rash    "quit breathing" (07/28/2012)  . Tylenol [Acetaminophen] Shortness Of Breath and Itching  . Pravastatin Sodium Other (See Comments)    Leg pain, problems ambulating   . Statins     Muscle pain  . Imdur [Isosorbide Nitrate] Other (See Comments)    Intolerance. "Can't remember the reaction."    Blood pressure (!) 156/82, pulse 66.  Malignant HTN with heart disease, w/o CHF, 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 Blood pressure today remains above desired goal but shows some improvement on systolic pressure. Her office BP readings are always better on second reading after resting for 5-10 minutes. Blood pressure at home also improved with an average reading of 158/66.  Patient is satisfied with taking valsartan twice daily instead of losartan TID. She denies having any problems with current therapy but insist on decreasing metoprolol dose. Will decrease metoprolol to 58m every morning and 1062mevery  evening for now, and follow up in 2 weeks for re-assessment. Patient was instructed to resume metoprolol 10036mwice daily if HR worsen. Long time was spent educating the patient about risk to change multiple medications at one time. She was also counseled on need for multiple medication to keep her BP well control and her heat as healthy as possible.  Julien Oscar Rodriguez-Guzman PharmD, BCPS, CPP [id number]/2019 10:31 AM

## 2017-10-21 NOTE — Patient Instructions (Addendum)
Return for a  follow up appointment in 2 weeks  Check your blood pressure at home daily (if able) and keep record of the readings.  Take your BP meds as follows:  DECREASE metoprolol to 55m (1/2 tablet) every morning and 1086min the evenings CONTINUE all other medication as previously ordered  Bring all of your meds, your BP cuff and your record of home blood pressures to your next appointment.  Exercise as you're able, try to walk approximately 30 minutes per day.  Keep salt intake to a minimum, especially watch canned and prepared boxed foods.  Eat more fresh fruits and vegetables and fewer canned items.  Avoid eating in fast food restaurants.    HOW TO TAKE YOUR BLOOD PRESSURE: . Rest 5 minutes before taking your blood pressure. .  Don't smoke or drink caffeinated beverages for at least 30 minutes before. . Take your blood pressure before (not after) you eat. . Sit comfortably with your back supported and both feet on the floor (don't cross your legs). . Elevate your arm to heart level on a table or a desk. . Use the proper sized cuff. It should fit smoothly and snugly around your bare upper arm. There should be enough room to slip a fingertip under the cuff. The bottom edge of the cuff should be 1 inch above the crease of the elbow. . Ideally, take 3 measurements at one sitting and record the average.

## 2017-10-22 ENCOUNTER — Encounter: Payer: Self-pay | Admitting: Pharmacist

## 2017-10-22 NOTE — Assessment & Plan Note (Addendum)
Blood pressure today remains above desired goal but shows some improvement on systolic pressure. Her office BP readings are always better on second reading after resting for 5-10 minutes. Blood pressure at home also improved with an average reading of 158/66.  Patient is satisfied with taking valsartan twice daily instead of losartan TID. She denies having any problems with current therapy but insist on decreasing metoprolol dose. Will decrease metoprolol to 88m every morning and 1060mevery evening for now, and follow up in 2 weeks for re-assessment. Patient was instructed to resume metoprolol 10050mwice daily if HR worsen. Long time was spent educating the patient about risk to change multiple medications at one time. She was also counseled on need for multiple medication to keep her BP well control and her heat as healthy as possible.

## 2017-10-28 DIAGNOSIS — M26621 Arthralgia of right temporomandibular joint: Secondary | ICD-10-CM | POA: Insufficient documentation

## 2017-10-28 DIAGNOSIS — H6123 Impacted cerumen, bilateral: Secondary | ICD-10-CM | POA: Insufficient documentation

## 2017-10-28 DIAGNOSIS — H6062 Unspecified chronic otitis externa, left ear: Secondary | ICD-10-CM | POA: Insufficient documentation

## 2017-11-04 ENCOUNTER — Ambulatory Visit: Payer: Medicare Other | Admitting: Pharmacist Clinician (PhC)/ Clinical Pharmacy Specialist

## 2017-11-04 DIAGNOSIS — I119 Hypertensive heart disease without heart failure: Secondary | ICD-10-CM

## 2017-11-04 NOTE — Assessment & Plan Note (Signed)
Patient with continued hypertension, hard to control mainly due to inability to tolerate multiple medications at effective doses.  In office reading was about 20 points higher than home average, and I would suspect there is some measure of white coat syndrome.  She was uncomfortable with me at first, as she had previously seen Raquel, but eventually calmed and seemed to be comfortable by the end of the visit.  She is quite hesitant to increase doses or add meds, so I explained the options that I saw.  Minoxidil (a white tablet) could be an option should we need better control, but for now I suggested increasing the metoprolol morning dose back to 100 mg every other day.  This may not get [place] much lowering of pressure, but I assured her that her heart rate occasionally dropping into the 50's would be fine.  She will try that and was scheduled to return in a month for follow up.  She will keep taking home blood pressure readings daily (her son checks it with the mercury device) and was encouraged to continue with dietary changes to lose weight.

## 2017-11-04 NOTE — Progress Notes (Signed)
Patient ID: MIMA CRANMORE                 DOB: 05-27-1946                      MRN: [id number]     HPI: Ruth [name] is a 72 y.o. female patient of Dr Claiborne Billings referred by K. Lawrence NP to HTN clinic.  Her medical history is significant for CAD s/p CABG x 4 in 2006 and stent placement in June 2018, depression, hyperlipidemia, IBS, labile hypertension, migraines, DM-II, obesity and multiple chemical sensitivities. Her chart indicates 15 documented allergies and she states her pharmacy has more.  She has been hard to treat because of this, however we have managed to get her on a regimen of valsartan , amlodipine and metoprolol.  IPatient present to HTN clinic today for follow up and denies dizziness, chest pain or headaches. Reports that her energy levels continue to be good, but she did cut her morning dose of metoprolol to 50 mg, as she believed it was causing her heart rate to go too low.    Current HTN meds:  Amlodipine 2.34m daily (9pm) Valsartan 166mtwice daily (8am and 8:30pm) Metoprolol tartrate 50 mg in am 1006mn pm (6am and 4pm)  Previously tried:  telmisartan-HCTZ 35m41m.5mg daily nevibolol 20mg5mronolactone 12.5mg  48mgoal: 140/90  Family History: 2 sons ; one son has hypertension and another had CABG recently. Mother died of stroke at at age 7, fa37er had  HTN but died at age 84.  S74ial History: Denies tobacco use, alcohol or illicit drugs.  Diet: Tried to follow low sodium and low fat diet. Drinks 1 cup of decaf coffee every day, otherwiswe water or decaf tea.  Has recently given up on many snack foods and sweets because of increasing blood sugars.  Has started eating more fruits and adding berries to her morning oatmeal.   Exercise:  has a "machine" that she does leg exercises with every other day for about an hour.    Home BP readings:   Has 2 home cuffs.  First is [employer], arm cuff - 208/93  72    30           Second is old mercury column,  probably about 40 yea62 old -182/8  Home readings (21) average 164/71 with a range of 146-185/52-84.    Labs:    09/2017:  Na 139, K 4.6 Glu 214, BUN 9 SCr 0.8 => CrCl 82.9  Wt Readings from Last 3 Encounters:  10/04/17 184 lb 12.8 oz (83.8 kg)  09/20/17 182 lb 3.2 oz (82.6 kg)  06/15/17 178 lb 9.6 oz (81 kg)   BP Readings from Last 3 Encounters:  11/04/17 (!) 182/84  10/21/17 (!) 156/82  10/08/17 (!) 160/70   Pulse Readings from Last 3 Encounters:  11/04/17 68  10/21/17 66  10/08/17 64    Past Medical History:  Diagnosis Date  . Anxiety   . CAD (coronary artery disease)    a. CABG x 4 in 2006 in [place]->LAD, VG->Diag, VG->OM, VG->RCA); b. DES to midbody of SVG-PDA/PLA 07/2012; c. 03/2015 Myoview: EF 65%, small defect of mild severit in basal inferolateral wall, low risk.  . Complication of anesthesia    "quit breathing when I got ?Inovar" (07/28/2012)  . Depression   . Diverticulosis   . Dyslipidemia    Intolerant of statins  . Fecal incontinence   .  GERD (gastroesophageal reflux disease)   . Hyperlipidemia   . Hyperplastic colon polyp   . IBS (irritable bowel syndrome)   . Labile hypertension    a. 09/2016 Renal Artery duplex: nl renal arteries.  . Migraines    "had them in my 30's" (07/28/2012)  . Multiple allergies   . Obesity   . Steatohepatitis   . Type II diabetes mellitus (HCC)    a. no meds, diet controlled - takes cinnamon.    Current Outpatient Medications on File Prior to Visit  Medication Sig Dispense Refill  . Cinnamon 500 MG capsule Take 500 mg by mouth daily.    . Magnesium 250 MG TABS Take 250 mg by mouth daily.    Marland Kitchen amLODipine (NORVASC) 5 MG tablet Take 1 tablet (5 mg total) by mouth daily. 90 tablet 3  . aspirin EC 81 MG tablet Take 81 mg by mouth daily.    . calcium carbonate (TUMS - DOSED IN MG ELEMENTAL CALCIUM) 500 MG chewable tablet Chew 1-2 tablets by mouth 3 (three) times daily as needed for heartburn.     . cholecalciferol  (VITAMIN D) 1000 UNITS tablet Take 1,000 Units by mouth daily.    Marland Kitchen CRANBERRY-VITAMIN C PO Take 1 tablet by mouth daily.    . metoprolol tartrate (LOPRESSOR) 100 MG tablet take 1 tablet by mouth twice a day (MUST BE WHITE TABLETS) 60 tablet 6  . nitroGLYCERIN (NITROSTAT) 0.4 MG SL tablet Place 1 tablet (0.4 mg total) under the tongue every 5 (five) minutes as needed for chest pain (up to 3 doses). 25 tablet 4  . prasugrel (EFFIENT) 10 MG TABS tablet Take 1 tablet (10 mg total) by mouth daily. 30 tablet 0  . valsartan (DIOVAN) 160 MG tablet Take 1 tablet (160 mg total) by mouth 2 (two) times daily. To replace losartan. 60 tablet 1  . vitamin B-12 (CYANOCOBALAMIN) 1000 MCG tablet Take 1,000 mcg by mouth daily.     No current facility-administered medications on file prior to visit.     Allergies  Allergen Reactions  . Betadine [Povidone Iodine] Shortness Of Breath and Swelling  . Codeine Anaphylaxis and Shortness Of Breath    "quit breathing" (07/28/2012)  **Tolerates 1-2 shots of morphine**  . Demerol Anaphylaxis    "quit breathing" (07/28/2012)  . Iohexol Anaphylaxis    Finger/ankle swelling  . Latex Anaphylaxis    "quit breathing" (07/28/2012)  . Other Anaphylaxis    Perfume, Any Fragrance. Cleaning Fluids.  "quit breathing" (07/28/2012)  . Percocet [Oxycodone-Acetaminophen] Anaphylaxis    "quit breathing; disoriented" (07/28/2012)  . Plavix [Clopidogrel Bisulfate] Anaphylaxis    "get hot; like I'm burning up inside; had to put me in shower after OHS because of that" (07/28/2012)  . Red Dye Anaphylaxis    "quit breathing" (07/28/2012)  . Shellfish Allergy Shortness Of Breath    "broke out in knots all over" (07/28/2012)  . Sulfonamide Derivatives Shortness Of Breath and Rash    "quit breathing" (07/28/2012)  . Tylenol [Acetaminophen] Shortness Of Breath and Itching  . Pravastatin Sodium Other (See Comments)    Leg pain, problems ambulating   . Statins     Muscle pain  .  Imdur [Isosorbide Nitrate] Other (See Comments)    Intolerance. "Can't remember the reaction."    Blood pressure (!) 182/84, pulse 68.  Malignant HTN with heart disease, w/o CHF, 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 Patient with continued hypertension, hard to control mainly due to inability to tolerate multiple medications at  effective doses.  In office reading was about 20 points higher than home average, and I would suspect there is some measure of white coat syndrome.  She was uncomfortable with me at first, as she had previously seen Raquel, but eventually calmed and seemed to be comfortable by the end of the visit.  She is quite hesitant to increase doses or add meds, so I explained the options that I saw.  Minoxidil (a white tablet) could be an option should we need better control, but for now I suggested increasing the metoprolol morning dose back to 100 mg every other day.  This may not get [place] much lowering of pressure, but I assured her that her heart rate occasionally dropping into the 50's would be fine.  She will try that and was scheduled to return in a month for follow up.  She will keep taking home blood pressure readings daily (her son checks it with the mercury device) and was encouraged to continue with dietary changes to lose weight.   Tommy Medal PharmD, CPP [place] [id number] 11/04/2017 10:31 AM

## 2017-11-04 NOTE — Patient Instructions (Addendum)
Return for a a follow up appointment on March 28 at 10 am  Your blood pressure today is 182/84   Check your blood pressure at home daily and keep record of the readings.  Take your BP meds as follows:  Try increasing the metoprolol to 100 mg in the mornings every other day.    Continue with all your other medications   Bring all of your meds, your BP cuff and your record of home blood pressures to your next appointment.  Exercise as you're able, try to walk approximately 30 minutes per day.  Keep salt intake to a minimum, especially watch canned and prepared boxed foods.  Eat more fresh fruits and vegetables and fewer canned items.  Avoid eating in fast food restaurants.    HOW TO TAKE YOUR BLOOD PRESSURE: . Rest 5 minutes before taking your blood pressure. .  Don't smoke or drink caffeinated beverages for at least 30 minutes before. . Take your blood pressure before (not after) you eat. . Sit comfortably with your back supported and both feet on the floor (don't cross your legs). . Elevate your arm to heart level on a table or a desk. . Use the proper sized cuff. It should fit smoothly and snugly around your bare upper arm. There should be enough room to slip a fingertip under the cuff. The bottom edge of the cuff should be 1 inch above the crease of the elbow. . Ideally, take 3 measurements at one sitting and record the average.

## 2017-11-08 ENCOUNTER — Telehealth: Payer: Self-pay | Admitting: Cardiovascular Disease

## 2017-11-08 NOTE — Telephone Encounter (Signed)
Returned call to patient, patient reports she called EMS earlier because her BP was so high.  States her BP was 230/105, states she was dizzy and her eye was twitching.  States they did an EKG and this was okay.   She refused to go to the hospital because she doesn't want to wait.   States BP now is 154/74.   Denies blurry vision or HA.   States she is tired of feeling bad.  Would like to see Jory Sims DNP.    Advised there is no availabilities before 3/7 (which she is scheduled for).   Patient aware and verbalized understanding.   Will monitor BP and bring log with her.  Also advised if symptoms reoccur or worsen, then she may need to go to ER to be evaluated acutely.    Patient agreed and verbalized understanding.

## 2017-11-08 NOTE — Telephone Encounter (Signed)
New patient  Call home first if no answer then cell    Pt c/o BP issue: STAT if pt c/o blurred vision, one-sided weakness or slurred speech  1. What are your last 5 BP readings? 230/105  2. Are you having any other symptoms (ex. Dizziness, headache, blurred vision, passed out)? Upset stomach, leg pain , eye twitching  3. What is your BP issue?  bp is elevated , emt's did a ekg and said it was fine , but to get appt for elevated bp

## 2017-11-10 NOTE — Progress Notes (Signed)
Cardiology Office Note   Date:  11/11/2017   ID:  Ruth [name], DOB 28-Aug-1946, MRN [id number]  PCP:  Ruth Infante, MD  Cardiologist:  Dr. Claiborne Billings Chief Complaint  Patient presents with  . Hypertension     History of Present Illness: Ruth [name] is a 72 y.o. female who presents for ongoing assessment and management of difficult to control hypertension,coronary artery disease status post CABG, most recent cardiac catheterization revealing graft lesion of SVG to RCA with 95% stenosis status post drug-eluting stent.  Was last seen on 10/04/2017, is very tearful and anxious due to hypertension and anxiety over this. He had been recommended to take extra dose of losartan 100 mg to decrease blood pressure. However she did not like taking that dose and stopped. She started taking 50 mg twice a day as she had taken before. The patient also begun taking magnesium which she believed helping her blood pressure, as she is better blood pressure decreased from 180 mmHg, dropping 60 mmHg systolic. Last office visit blood pressure was 198/88.  The patient refused to take higher doses of medications, or any additional medication to assist with her blood pressure control. I suggested starting spironolactone but she refused, hydralazine but she refused. I'm considering clonidine on visit. She is now taking losartan 50 mg 3 times a day which is off label in order to control blood pressure.  She was referred to Pharm D. assist me and better blood pressure control. Patient was also recommended to follow-up with primary care, with possible referral to psychiatry for counseling. May need addition of antianginal medication as well.  She was seen by Ruth [name]. On 11/04/2017. He was recommended by [name], she did increase metoprolol morning dose to 100 mg every other day. Discussion of need to begin minoxidil for better blood pressure control was also had, but she did not wish to add any new  medications.   Unfortunately, the patient was taken to the ER on 11/08/2017 due to elevated blood pressure 230/105, with associated dizziness and eye twitching. Refused transport to the hospital. The patient blood pressure did decrease to one 154/74.   This is a focus visit to evaluate blood pressure control on her medication regimen. The patient is requesting a change from valsartan back to Vasotec which she had been taking in the past. She has lost 10 pounds, as significantly restricted her caloric intake and is walking and using a exercise machine and her home.  She believes that her blood pressure tends to go higher with anxiety and stress at home with her family. She has increased her metoprolol on her own to 100 mg in the morning and 50 mg at night. She does not wish to have any additional medications added to her regimen. She states that when she takes her blood pressure and it is very elevated she will take an extra dose of magnesium, and relax, sometimes taking a bath,and feels better.   Past Medical History:  Diagnosis Date  . Anxiety   . CAD (coronary artery disease)    a. CABG x 4 in 2006 in [place] (LIMA->LAD, VG->Diag, VG->OM, VG->RCA); b. DES to midbody of SVG-PDA/PLA 07/2012; c. 03/2015 Myoview: EF 65%, small defect of mild severit in basal inferolateral wall, low risk.  . Complication of anesthesia    "quit breathing when I got ?Inovar" (07/28/2012)  . Depression   . Diverticulosis   . Dyslipidemia    Intolerant of statins  . Fecal incontinence   .  GERD (gastroesophageal reflux disease)   . Hyperlipidemia   . Hyperplastic colon polyp   . IBS (irritable bowel syndrome)   . Labile hypertension    a. 09/2016 Renal Artery duplex: nl renal arteries.  . Migraines    "had them in my 30's" (07/28/2012)  . Multiple allergies   . Obesity   . Steatohepatitis   . Type II diabetes mellitus (HCC)    a. no meds, diet controlled - takes cinnamon.    Past Surgical History:    Procedure Laterality Date  . ABDOMINAL HYSTERECTOMY  1970's  . BREAST LUMPECTOMY     bilateral  . CARDIAC CATHETERIZATION  2006  . CORONARY ANGIOPLASTY WITH STENT PLACEMENT  07/28/2012   "1; first time for me" (07/28/2012)  . CORONARY ARTERY BYPASS GRAFT  2006   CABG X4  . CORONARY STENT INTERVENTION N/A 03/01/2017   Procedure: Coronary Stent Intervention;  Surgeon: Ruth Sine, MD;  Location: [place] [facility];  Service: Cardiovascular;  Laterality: N/A;  . EXCISIONAL HEMORRHOIDECTOMY  1970's  . INGUINAL HERNIA REPAIR  1970's?   left  . LEFT HEART CATH AND CORS/GRAFTS ANGIOGRAPHY N/A 03/01/2017   Procedure: Left Heart Cath and Cors/Grafts Angiography;  Surgeon: Ruth Sine, MD;  Location: [place] [facility];  Service: Cardiovascular;  Laterality: N/A;  . LEFT HEART CATHETERIZATION WITH CORONARY/GRAFT ANGIOGRAM N/A 07/28/2012   Procedure: LEFT HEART CATHETERIZATION WITH Ruth [name];  Surgeon: Ruth Blanks, MD;  Location: [name] C[facility];  Service: Cardiovascular;  Laterality: N/A;  . PERCUTANEOUS CORONARY STENT INTERVENTION (PCI-S)  07/28/2012   Procedure: PERCUTANEOUS CORONARY STENT INTERVENTION (PCI-S);  Surgeon: Ruth Blanks, MD;  Location: [facility] CATH LAB;  Service: Cardiovascular;;  . TONSILLECTOMY AND ADENOIDECTOMY  ~ 1951     Current Outpatient Medications  Medication Sig Dispense Refill  . aspirin EC 81 MG tablet Take 81 mg by mouth daily.    . calcium carbonate (TUMS - DOSED IN MG ELEMENTAL CALCIUM) 500 MG chewable tablet Chew 1-2 tablets by mouth 3 (three) times daily as needed for heartburn.     . cholecalciferol (VITAMIN D) 1000 UNITS tablet Take 1,000 Units by mouth daily.    . Cinnamon 500 MG capsule Take 500 mg by mouth daily.    Marland Kitchen CRANBERRY-VITAMIN C PO Take 1 tablet by mouth daily.    . Magnesium 250 MG TABS Take 250 mg by mouth daily.    . metoprolol tartrate (LOPRESSOR) 100 MG tablet take 1 tablet by mouth twice a day (MUST BE WHITE  TABLETS) 60 tablet 6  . nitroGLYCERIN (NITROSTAT) 0.4 MG SL tablet Place 1 tablet (0.4 mg total) under the tongue every 5 (five) minutes as needed for chest pain (up to 3 doses). 25 tablet 4  . prasugrel (EFFIENT) 10 MG TABS tablet Take 1 tablet (10 mg total) by mouth daily. 30 tablet 0  . valsartan (DIOVAN) 160 MG tablet Take 1 tablet (160 mg total) by mouth 2 (two) times daily. To replace losartan. 60 tablet 1  . vitamin B-12 (CYANOCOBALAMIN) 1000 MCG tablet Take 1,000 mcg by mouth daily.    Marland Kitchen amLODipine (NORVASC) 5 MG tablet Take 1 tablet (5 mg total) by mouth daily. 90 tablet 3   No current facility-administered medications for this visit.     Allergies:   Betadine [povidone iodine]; Codeine; Demerol; Iohexol; Latex; Other; Percocet [oxycodone-acetaminophen]; Plavix [clopidogrel bisulfate]; Red dye; Shellfish allergy; Sulfonamide derivatives; Tylenol [acetaminophen]; Pravastatin sodium; Statins; and Imdur [isosorbide nitrate]  Social History:  The patient  reports that  has never smoked. she has never used smokeless tobacco. She reports that she does not drink alcohol or use drugs.   Family History:  The patient's family history includes Breast cancer in her unknown relative; Colon cancer in her father; Colon polyps in her father; Coronary artery disease in her father; Diabetes in her maternal grandmother and paternal grandmother; Heart disease in her father; Hypertension in her unknown relative; Stroke in her mother.    ROS: All other systems are reviewed and negative. Unless otherwise mentioned in H&P    PHYSICAL EXAM: VS:  BP (!) 156/90   Pulse 72   Ht 5' 3.5" (1.613 m)   Wt 184 lb (83.5 kg)   BMI 32.08 kg/m  , BMI Body mass index is 32.08 kg/m. GEN: Well nourished, well developed, in no acute distress  HEENT: normal  Neck: no JVD, carotid bruits, or masses Cardiac: RRR; no murmurs, rubs, or gallops,no edema  Respiratory:  clear to auscultation bilaterally, normal work of  breathing GI: soft, nontender, nondistended, + BS MS: no deformity or atrophy  Skin: warm and dry, no rash Neuro:  Strength and sensation are intact Psych: euthymic mood, full affect   Recent Labs: 12/14/2016: ALT 12 03/02/2017: BUN 19; Creatinine, Ser 0.92; Hemoglobin 13.6; Platelets 199; Potassium 3.5; Sodium 141    Lipid Panel    Component Value Date/Time   CHOL 244 (H) 03/19/2015 0812   TRIG 262 (H) 03/19/2015 0812   HDL 29 (L) 03/19/2015 0812   CHOLHDL 8.4 03/19/2015 0812   VLDL 52 (H) 03/19/2015 0812   LDLCALC 163 (H) 03/19/2015 0812   LDLDIRECT 160.9 06/21/2012 0833      Wt Readings from Last 3 Encounters:  11/11/17 184 lb (83.5 kg)  10/04/17 184 lb 12.8 oz (83.8 kg)  09/20/17 182 lb 3.2 oz (82.6 kg)      Other studies Reviewed: Cardiac Cath 03/01/2017 Conclusion     Prox Cx to Mid Cx lesion, 100 %stenosed.  1st Mrg lesion, 100 %stenosed.  Ost LAD to Prox LAD lesion, 70 %stenosed.  Prox RCA lesion, 75 %stenosed.  Mid RCA lesion, 100 %stenosed.  SVG.  Prox Graft lesion, 60 %stenosed.  LIMA.  SVG.  Dist Graft lesion, 95 %stenosed.  A STENT SYNERGY DES 3.5X32 drug eluting stent was successfully placed.  Mid Graft lesion, 60 %stenosed.  Post intervention, there is a 0% residual stenosis.  The left ventricular systolic function is normal.  LV end diastolic pressure is normal.  The left ventricular ejection fraction is 55-65% by visual estimate.   Echocardiogram 03/13/2015 Left ventricle: The cavity size was normal. Wall thickness was normal. Systolic function was normal. The estimated ejection fraction was in the range of 55% to 60%. - Mitral valve: There was mild regurgitation. - Left atrium: The atrium was mildly dilated. - Atrial septum: No defect or patent foramen ovale was identified. - Pulmonary arteries: PA peak pressure: 32 mm Hg (S).   ASSESSMENT AND PLAN:  1. Hypertension: Has been very difficult to control, but has  improved significantly on current medication regimen. She wishes to change her medication around and stopped and began and enalapril instead. I've explained to her that I now wished to manipulate her medication anymore as we are finally seeing improvement and progress. I would like to increase her amlodipine to 10 mg daily for better control but she refuses higher doses as it causes her to have lower extremity edema. I'm going  to continue to watch her blood pressure, even though it is not all normal, it is significantly improved.  Hopefully, with better diet and weight loss with increase exercise the patient blood pressure will improve. Frequent office visits with manipulation medication, and also multiple home stressors is not required at this time.  I've asked her to take her blood pressure in the morning and in the afternoon. It also to take her blood pressure medication at directed interval on the twice a day regimen. She sometimes takes her medication at times during the day and reinforced her to take them as directed.  Current medicines are reviewed at length with the patient today.    Labs/ tests ordered today include: None   Phill Myron. West Pugh, ANP, AACC   11/11/2017 12:46 PM    [facility] [address] [place], [place] [address] Phone: [phone]; Fax: [phone]

## 2017-11-11 ENCOUNTER — Encounter: Payer: Self-pay | Admitting: Adult Health

## 2017-11-11 ENCOUNTER — Ambulatory Visit: Payer: Medicare Other | Admitting: Adult Health

## 2017-11-11 VITALS — BP 156/90 | HR 72 | Ht 63.5 in | Wt 184.0 lb

## 2017-11-11 DIAGNOSIS — F418 Other specified anxiety disorders: Secondary | ICD-10-CM | POA: Diagnosis not present

## 2017-11-11 DIAGNOSIS — I2510 Atherosclerotic heart disease of native coronary artery without angina pectoris: Secondary | ICD-10-CM | POA: Diagnosis not present

## 2017-11-11 DIAGNOSIS — I10 Essential (primary) hypertension: Secondary | ICD-10-CM | POA: Diagnosis not present

## 2017-11-11 NOTE — Patient Instructions (Signed)
Medication Instructions:  NO CHANGES- Your physician recommends that you continue on your current medications as directed. Please refer to the Current Medication list given to you today.  If you need a refill on your cardiac medications before your next appointment, please call your pharmacy.  Follow-Up: Your physician wants you to follow-up in: [place].     Thank you for choosing [facility] at [facility]!!

## 2017-12-01 NOTE — Progress Notes (Signed)
Patient ID: NIMRAT WOOLWORTH                 DOB: 1946-01-05                      MRN: [id number]     HPI: Ruth [name] is a 72 y.o. female patient of Dr Claiborne Billings referred by K. Lawrence NP to HTN clinic.  Her medical history is significant for CAD s/p CABG x 4 in 2006 and stent placement in June 2018, depression, hyperlipidemia, IBS, labile hypertension, migraines, DM-II, obesity and multiple chemical sensitivities. Her chart indicates 15 documented allergies and she states her pharmacy has more.  She has been hard to treat because of this, however we have managed to get her on a regimen of valsartan, amlodipine and metoprolol.  Since her last visit to CVRR clinic patient had an episode where she called EMS because of a BP recorded at 230/105.  An EKG done by EMS appeared to be normal and patient refused transport to hospital.  She again saw Ruth Sims DNP, a few days later and asked to switch the valsartan back to enalapril, which she apparently took some years ago.  Dr. Purcell Nails stressed to her the importance of not switching medications, especially when there are apparently only a few that the patient can tolerate.   And she consistently refuses to increase doses on her current medications, although she did increase the metoprolol from 50 mg am/100 mg pm to 100 mg bid.    Today she returns for a follow up BP visit.  She reports no concerns Is taking metoprolol 1-- mg bid Started magnesium 250 mg bid plus magnesium baths     Current HTN meds:  Amlodipine 2.70m daily (9pm) Valsartan 1652mtwice daily (8am and 8:30pm) Metoprolol tartrate 100 mg bid (6am and 4pm)  Previously tried:  telmisartan-HCTZ 8073m2.5mg daily nevibolol 3m73mironolactone 12.5mg 25mralazine - patient refuses  BP goal: 140/90  Family History: 2 sons ; one son has hypertension and another had CABG recently. Mother died of stroke at at age 57, f44her had  HTN but died at age 44.  67cial History: Denies  tobacco use, alcohol or illicit drugs.  Diet: Tried to follow low sodium and low fat diet. Drinks 1 cup of decaf coffee every day, otherwiswe water or decaf tea.  Has recently given up on many snack foods and sweets because of increasing blood sugars.  Has started eating more fruits and adding berries to her morning oatmeal.   Exercise:  has a "machine" that she does leg exercises with every other day for about an hour.    Home BP readings:   Still fairly wide range 130-170/68-88, currently averaging 158/76 based on the last 16 readings.  This does not include the day she called EMS for the 230/105  Labs:    09/2017:  Na 139, K 4.6 Glu 214, BUN 9 SCr 0.8 => CrCl 82.9  Wt Readings from Last 3 Encounters:  11/11/17 184 lb (83.5 kg)  10/04/17 184 lb 12.8 oz (83.8 kg)  09/20/17 182 lb 3.2 oz (82.6 kg)   BP Readings from Last 3 Encounters:  12/02/17 (!) 170/88  11/11/17 (!) 156/90  11/04/17 (!) 182/84   Pulse Readings from Last 3 Encounters:  12/02/17 72  11/11/17 72  11/04/17 68    Past Medical History:  Diagnosis Date  . Anxiety   . CAD (coronary artery disease)    a. CABG x  4 in 2006 in [place], VG->Diag, VG->OM, VG->RCA); b. DES to midbody of SVG-PDA/PLA 07/2012; c. 03/2015 Myoview: EF 65%, small defect of mild severit in basal inferolateral wall, low risk.  . Complication of anesthesia    "quit breathing when I got ?Inovar" (07/28/2012)  . Depression   . Diverticulosis   . Dyslipidemia    Intolerant of statins  . Fecal incontinence   . GERD (gastroesophageal reflux disease)   . Hyperlipidemia   . Hyperplastic colon polyp   . IBS (irritable bowel syndrome)   . Labile hypertension    a. 09/2016 Renal Artery duplex: nl renal arteries.  . Migraines    "had them in my 30's" (07/28/2012)  . Multiple allergies   . Obesity   . Steatohepatitis   . Type II diabetes mellitus (HCC)    a. no meds, diet controlled - takes cinnamon.    Current Outpatient  Medications on File Prior to Visit  Medication Sig Dispense Refill  . amLODipine (NORVASC) 5 MG tablet Take 1 tablet (5 mg total) by mouth daily. 90 tablet 3  . aspirin EC 81 MG tablet Take 81 mg by mouth daily.    . calcium carbonate (TUMS - DOSED IN MG ELEMENTAL CALCIUM) 500 MG chewable tablet Chew 1-2 tablets by mouth 3 (three) times daily as needed for heartburn.     . cholecalciferol (VITAMIN D) 1000 UNITS tablet Take 1,000 Units by mouth daily.    . Cinnamon 500 MG capsule Take 500 mg by mouth daily.    Marland Kitchen CRANBERRY-VITAMIN C PO Take 1 tablet by mouth daily.    . Magnesium 250 MG TABS Take 250 mg by mouth daily.    . metoprolol tartrate (LOPRESSOR) 100 MG tablet take 1 tablet by mouth twice a day (MUST BE WHITE TABLETS) 60 tablet 6  . nitroGLYCERIN (NITROSTAT) 0.4 MG SL tablet Place 1 tablet (0.4 mg total) under the tongue every 5 (five) minutes as needed for chest pain (up to 3 doses). 25 tablet 4  . prasugrel (EFFIENT) 10 MG TABS tablet Take 1 tablet (10 mg total) by mouth daily. 30 tablet 0  . valsartan (DIOVAN) 160 MG tablet Take 1 tablet (160 mg total) by mouth 2 (two) times daily. To replace losartan. 60 tablet 1  . vitamin B-12 (CYANOCOBALAMIN) 1000 MCG tablet Take 1,000 mcg by mouth daily.     No current facility-administered medications on file prior to visit.     Allergies  Allergen Reactions  . Betadine [Povidone Iodine] Shortness Of Breath and Swelling  . Codeine Anaphylaxis and Shortness Of Breath    "quit breathing" (07/28/2012)  **Tolerates 1-2 shots of morphine**  . Demerol Anaphylaxis    "quit breathing" (07/28/2012)  . Iohexol Anaphylaxis    Finger/ankle swelling  . Latex Anaphylaxis    "quit breathing" (07/28/2012)  . Other Anaphylaxis    Perfume, Any Fragrance. Cleaning Fluids.  "quit breathing" (07/28/2012)  . Percocet [Oxycodone-Acetaminophen] Anaphylaxis    "quit breathing; disoriented" (07/28/2012)  . Plavix [Clopidogrel Bisulfate] Anaphylaxis    "get  hot; like I'm burning up inside; had to put me in shower after OHS because of that" (07/28/2012)  . Red Dye Anaphylaxis    "quit breathing" (07/28/2012)  . Shellfish Allergy Shortness Of Breath    "broke out in knots all over" (07/28/2012)  . Sulfonamide Derivatives Shortness Of Breath and Rash    "quit breathing" (07/28/2012)  . Tylenol [Acetaminophen] Shortness Of Breath and Itching  . Pravastatin Sodium Other (  See Comments)    Leg pain, problems ambulating   . Statins     Muscle pain  . Imdur [Isosorbide Nitrate] Other (See Comments)    Intolerance. "Can't remember the reaction."    Blood pressure (!) 170/88, pulse 72.  HTN (hypertension) Patient with essential hypertension, difficult to control due to her multiple sensitivities and unwillingness to try other medications.  We have suggested the addition of minoxidil in the past, as it is a white tablet, but she continues to decline.  States she would like to wait until her next appointment with Ruth Sims DNP to make that decision.  She seems to feel that her pressure is improving and does not wish to make any current changes.  We will see her again in 2 months.    Tommy Medal PharmD, CPP [facility] [place] [id number] 12/02/2017 7:18 PM

## 2017-12-02 ENCOUNTER — Ambulatory Visit: Payer: Medicare Other | Admitting: Pharmacist Clinician (PhC)/ Clinical Pharmacy Specialist

## 2017-12-02 ENCOUNTER — Encounter: Payer: Self-pay | Admitting: Pharmacist Clinician (PhC)/ Clinical Pharmacy Specialist

## 2017-12-02 ENCOUNTER — Other Ambulatory Visit: Payer: Self-pay | Admitting: Cardiovascular Disease

## 2017-12-02 DIAGNOSIS — I10 Essential (primary) hypertension: Secondary | ICD-10-CM | POA: Diagnosis not present

## 2017-12-02 NOTE — Assessment & Plan Note (Signed)
Patient with essential hypertension, difficult to control due to her multiple sensitivities and unwillingness to try other medications.  We have suggested the addition of minoxidil in the past, as it is a white tablet, but she continues to decline.  States she would like to wait until her next appointment with Jory Sims DNP to make that decision.  She seems to feel that her pressure is improving and does not wish to make any current changes.  We will see her again in 2 months.

## 2017-12-02 NOTE — Patient Instructions (Signed)
Return for a a follow up appointment in May  Your blood pressure today is 170/88  Check your blood pressure at home daily and keep record of the readings.  Take your BP meds as follows:  No change in medications  Bring all of your meds, your BP cuff and your record of home blood pressures to your next appointment.  Exercise as you're able, try to walk approximately 30 minutes per day.  Keep salt intake to a minimum, especially watch canned and prepared boxed foods.  Eat more fresh fruits and vegetables and fewer canned items.  Avoid eating in fast food restaurants.    HOW TO TAKE YOUR BLOOD PRESSURE: . Rest 5 minutes before taking your blood pressure. .  Don't smoke or drink caffeinated beverages for at least 30 minutes before. . Take your blood pressure before (not after) you eat. . Sit comfortably with your back supported and both feet on the floor (don't cross your legs). . Elevate your arm to heart level on a table or a desk. . Use the proper sized cuff. It should fit smoothly and snugly around your bare upper arm. There should be enough room to slip a fingertip under the cuff. The bottom edge of the cuff should be 1 inch above the crease of the elbow. . Ideally, take 3 measurements at one sitting and record the average.

## 2018-01-03 ENCOUNTER — Ambulatory Visit: Payer: Medicare Other | Admitting: Adult Health

## 2018-01-07 ENCOUNTER — Other Ambulatory Visit: Payer: Self-pay | Admitting: Cardiology

## 2018-01-21 ENCOUNTER — Telehealth: Payer: Self-pay | Admitting: Cardiovascular Disease

## 2018-01-21 NOTE — Telephone Encounter (Signed)
acknowledged

## 2018-01-21 NOTE — Telephone Encounter (Signed)
New message   Patient calling with concerns about pain near where stent was placed. Patient also complains of gasping episodes. She states she is not SOB, but feels her breathing is odd.

## 2018-01-21 NOTE — Telephone Encounter (Signed)
Returned call to patient. She states she has pain running thru her area where she had a stent. This was relieved with water + vinegar which she drinks for gas. She also feels like she is breathing deeper & harder but is not short of breath. She has not used NTG. She has MD OV 01/28/18. She will go to ED for any worsening symptoms. Will route to MD as Juluis Rainier

## 2018-01-28 ENCOUNTER — Encounter: Payer: Self-pay | Admitting: Cardiovascular Disease

## 2018-01-28 ENCOUNTER — Ambulatory Visit: Payer: Medicare Other | Admitting: Cardiovascular Disease

## 2018-01-28 VITALS — BP 164/100 | HR 66 | Ht 63.5 in | Wt 185.6 lb

## 2018-01-28 DIAGNOSIS — Z789 Other specified health status: Secondary | ICD-10-CM

## 2018-01-28 DIAGNOSIS — E785 Hyperlipidemia, unspecified: Secondary | ICD-10-CM

## 2018-01-28 DIAGNOSIS — F418 Other specified anxiety disorders: Secondary | ICD-10-CM

## 2018-01-28 DIAGNOSIS — I10 Essential (primary) hypertension: Secondary | ICD-10-CM | POA: Diagnosis not present

## 2018-01-28 DIAGNOSIS — I2510 Atherosclerotic heart disease of native coronary artery without angina pectoris: Secondary | ICD-10-CM | POA: Diagnosis not present

## 2018-01-28 DIAGNOSIS — R0789 Other chest pain: Secondary | ICD-10-CM | POA: Diagnosis not present

## 2018-01-28 DIAGNOSIS — Z889 Allergy status to unspecified drugs, medicaments and biological substances status: Secondary | ICD-10-CM

## 2018-01-28 MED ORDER — VALSARTAN 160 MG PO TABS: 160.0000 mg | ORAL_TABLET | Freq: Two times a day (BID) | ORAL | 3 refills | Status: DC

## 2018-01-28 MED ORDER — DOXAZOSIN MESYLATE 2 MG PO TABS: 1.0000 mg | ORAL_TABLET | Freq: Every day | ORAL | 3 refills | Status: DC

## 2018-01-28 NOTE — Patient Instructions (Signed)
Medication Instructions:  INCREASE valsartan to 160 mg two times daily START doxazosin 43m (1/2 tablet) at bedtime  Follow-Up: Keep appt with pharmacist  3-4 months with Dr. KClaiborne Billings Any Other Special Instructions Will Be Listed Below (If Applicable).     If you need a refill on your cardiac medications before your next appointment, please call your pharmacy.

## 2018-01-28 NOTE — Progress Notes (Signed)
Patient ID: Ruth [name], female   DOB: [birth year], 72 y.o.   MRN: [id number]   Primary M.D.: Dr. Crist Infante  HPI: Ruth. Ruth [name] is a 72 year old female who presents for a 7 month follow-up cardiology evaluation.  Ruth [name] has a history of coronary artery disease.  While living in [place] she developed chest discomfort and in Nov 15, 2004 underwent CABG surgery 4 at the [facility] in [place] by Dr. Marzetta Merino.  She had a LIMA to LAD, SVG to diagonal, SVG to obtuse marginal, and SVG to the RCA.   Her husband died in 2010-11-15 and she moved to the [place] area.  In 11/16/2011, she experienced symptoms of increasing shortness of breath.  She underwent cardiac catheterization and a stent was placed in the vein graft to her RCA.  She states that she has not had any subsequent stress testing.  She is a former patient of Dr. Einar Gip and an echocardiogram in July 2014 showed normal LV size and function with mild concentric left ventricular hypertrophy.  She had mild to moderate mitral regurgitation, mild tricuspid regurgitation, and mild primary hypertension with an estimated PA pressure 39 mm.  She has a history of hypertension for at least 15-20 years.  She states her blood pressure at home typically is in the 145-150 range, but her blood pressure is always elevated when she goes to the doctor's office.  She has a history of hyperlipidemia and apparently has been intolerant to statins but has never tried Zetia.  There is a history of diabetes mellitus but is not on therapy and states this is diet controlled.    She admits to  increased stress.  Her mother died in December 14, 2014 and recently her dog died.  She does not routinely exercise.  When I initially saw her in June 2016 she has been taking amlodipine 5 mg, losartan 50 mg twice a day and metoprolol 50 mrem twice a day for hypertension and CAD.  She has a history of multiple allergies.  She states that she is Intolerant to amlodipine as  well as Spironolactone.  She has been having labile blood pressure.  In the past. She became dehydrated on diarrhetic therapy.    An echo Doppler study on 03/13/2015  showed an ejection fraction at 55-60%.  There was mild mitral regurgitation, mild left atrial dilatation, and very mild pulmonary hypertension with an estimated PA pressure 32 mm.  A nuclear perfusion study done on 03/21/2015 was low risk and demonstrated a very small region of mild ischemia in the basal anterolateral wall.  She had normal LV function with an EF of 65%.  She developed recurrent episodes of chest pain in June 2018 which  led to her undergoing definitive cardiac catheterization on 03/01/2017.  She was found to have preserved global LV contractility with an EF of 55-60%.  There was significant multivessel CAD with 70% diffuse proximal LAD stenoses, total occlusion of the circumflex and OM1 proximally, and 75% proximal RCA stenosis with occlusion of the RCA prior to the acute margin.  She had a patent LIMA graft supplying the mid LAD, which may also anastomose into the diagonal vessel, but this was uncertain.  The vein graft supplying the distal marginal circumflex branch had smooth 60% proximal to mid body stenosis with TIMI-3 flow.  The vein graft supplying the distal RCA had focal 60% stenosis followed by 99% thrombotic stenosis beyond a stented segment.  She underwent successful PCI to this vein graft to  the RCA with insertion of a 3.532 mm Synergy stent postdilated to 3.71 mm with a Radiation protection practitioner used for distal graft protection.  It was recommended that she continue to antiplatelet therapy indefinitely.  Ruth [name] has significant allergies to multiple medications.  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  She has been on losartan 50 Milligan grams twice a day, metoprolol 100 mg twice a day, and continues to take  aspirin 81 mg and Effient 10 mg.  She did not tolerate Plavix or Brilinta.  She states that she is intolerant to statins.  She has no interest in PCSK9 inhibition.    Since I last saw her in October 2018, she has been seen in the office at least 5 times by extenders as well as pharmacists.  She is intolerant to numerous medications.  She last saw Nehemiah Massed, [facility] on December 02, 2017 who suggested the possibility of minoxidil but she continued to deny this medication change.  She states most recently her blood pressure at home has been ranging from 616-073 systolically and 71-06 diastolically.  She had called the office stating that she was having pain running through the area where she had a stent.  This was relieved with "water and vinegar which she drinks for gas.  She has not required nitroglycerin.  Because of concern she presents for evaluation.  Past Medical History:  Diagnosis Date  . Anxiety   . CAD (coronary artery disease)    a. CABG x 4 in 2006 in [place] (LIMA->LAD, VG->Diag, VG->OM, VG->RCA); b. DES to midbody of SVG-PDA/PLA 07/2012; c. 03/2015 Myoview: EF 65%, small defect of mild severit in basal inferolateral wall, low risk.  . Complication of anesthesia    "quit breathing when I got ?[place]ar" (07/28/2012)  . Depression   . Diverticulosis   . Dyslipidemia    Intolerant of statins  . Fecal incontinence   . GERD (gastroesophageal reflux disease)   . Hyperlipidemia   . Hyperplastic colon polyp   . IBS (irritable bowel syndrome)   . Labile hypertension    a. 09/2016 Renal Artery duplex: nl renal arteries.  . Migraines    "had them in my 30's" (07/28/2012)  . Multiple allergies   . Obesity   . Steatohepatitis   . Type II diabetes mellitus (HCC)    a. no meds, diet controlled - takes cinnamon.    Past Surgical History:  Procedure Laterality Date  . ABDOMINAL HYSTERECTOMY  1970's  . BREAST LUMPECTOMY     bilateral  . CARDIAC CATHETERIZATION  2006  . CORONARY  ANGIOPLASTY WITH STENT PLACEMENT  07/28/2012   "1; first time for me" (07/28/2012)  . CORONARY ARTERY BYPASS GRAFT  2006   CABG X4  . CORONARY STENT INTERVENTION N/A 03/01/2017   Procedure: Coronary Stent Intervention;  Surgeon: Troy Sine, MD;  Location: [facility];  Service: Cardiovascular;  Laterality: N/A;  . EXCISIONAL HEMORRHOIDECTOMY  1970's  . INGUINAL HERNIA REPAIR  1970's?   left  . LEFT HEART CATH AND CORS/GRAFTS ANGIOGRAPHY N/A 03/01/2017   Procedure: Left Heart Cath and Cors/Grafts Angiography;  Surgeon: Troy Sine, MD;  Location: [facility];  Service: Cardiovascular;  Laterality: N/A;  . LEFT HEART CATHETERIZATION WITH CORONARY/GRAFT ANGIOGRAM N/A 07/28/2012   Procedure: LEFT HEART CATHETERIZATION WITH Beatrix Fetters;  Surgeon: Burnell Blanks, MD;  Location: [facility] C[facility] LAB;  Service:  Cardiovascular;  Laterality: N/A;  . PERCUTANEOUS CORONARY STENT INTERVENTION (PCI-S)  07/28/2012   Procedure: PERCUTANEOUS CORONARY STENT INTERVENTION (PCI-S);  Surgeon: Burnell Blanks, MD;  Location: [facility] CATH LAB;  Service: Cardiovascular;;  . TONSILLECTOMY AND ADENOIDECTOMY  ~ 1951    Allergies  Allergen Reactions  . Betadine [Povidone Iodine] Shortness Of Breath and Swelling  . Codeine Anaphylaxis and Shortness Of Breath    "quit breathing" (07/28/2012)  **Tolerates 1-2 shots of morphine**  . Demerol Anaphylaxis    "quit breathing" (07/28/2012)  . Iohexol Anaphylaxis    Finger/ankle swelling  . Latex Anaphylaxis    "quit breathing" (07/28/2012)  . Other Anaphylaxis    Perfume, Any Fragrance. Cleaning Fluids.  "quit breathing" (07/28/2012)  . Percocet [Oxycodone-Acetaminophen] Anaphylaxis    "quit breathing; disoriented" (07/28/2012)  . Plavix [Clopidogrel Bisulfate] Anaphylaxis    "get hot; like I'm burning up inside; had to put me in shower after OHS because of that" (07/28/2012)  . Red Dye Anaphylaxis    "quit breathing" (07/28/2012)   . Shellfish Allergy Shortness Of Breath    "broke out in knots all over" (07/28/2012)  . Sulfonamide Derivatives Shortness Of Breath and Rash    "quit breathing" (07/28/2012)  . Tylenol [Acetaminophen] Shortness Of Breath and Itching  . Pravastatin Sodium Other (See Comments)    Leg pain, problems ambulating   . Statins     Muscle pain  . Imdur [Isosorbide Nitrate] Other (See Comments)    Intolerance. "Can't remember the reaction."    Current Outpatient Medications  Medication Sig Dispense Refill  . aspirin EC 81 MG tablet Take 81 mg by mouth daily.    . calcium carbonate (TUMS - DOSED IN MG ELEMENTAL CALCIUM) 500 MG chewable tablet Chew 1-2 tablets by mouth 3 (three) times daily as needed for heartburn.     . cholecalciferol (VITAMIN D) 1000 UNITS tablet Take 1,000 Units by mouth daily.    . Cinnamon 500 MG capsule Take 500 mg by mouth daily.    Marland Kitchen CRANBERRY-VITAMIN C PO Take 1 tablet by mouth daily.    . Magnesium 250 MG TABS Take 250 mg by mouth daily.    . metoprolol tartrate (LOPRESSOR) 100 MG tablet take 1 tablet by mouth twice a day (MUST BE WHITE TABLETS) 60 tablet 6  . nitroGLYCERIN (NITROSTAT) 0.4 MG SL tablet Place 1 tablet (0.4 mg total) under the tongue every 5 (five) minutes as needed for chest pain (up to 3 doses). 25 tablet 4  . prasugrel (EFFIENT) 10 MG TABS tablet TAKE 1 TABLET BY MOUTH DAILY 90 tablet 0  . valsartan (DIOVAN) 160 MG tablet Take 1 tablet (160 mg total) by mouth 2 (two) times daily. 180 tablet 3  . vitamin B-12 (CYANOCOBALAMIN) 1000 MCG tablet Take 1,000 mcg by mouth daily.    Marland Kitchen amLODipine (NORVASC) 5 MG tablet Take 1 tablet (5 mg total) by mouth daily. (Patient taking differently: Take 2.5 mg by mouth daily. ) 90 tablet 3  . doxazosin (CARDURA) 2 MG tablet Take 0.5 tablets (1 mg total) by mouth daily. 45 tablet 3   No current facility-administered medications for this visit.     Social History   Socioeconomic History  . Marital status: Widowed     Spouse name: Not on file  . Number of children: 2  . Years of education: Not on file  . Highest education level: Not on file  Occupational History  . Occupation: retired  Scientific laboratory technician  . Emergency planning/management officer  strain: Not on file  . Food insecurity:    Worry: Not on file    Inability: Not on file  . Transportation needs:    Medical: Not on file    Non-medical: Not on file  Tobacco Use  . Smoking status: Never Smoker  . Smokeless tobacco: Never Used  Substance and Sexual Activity  . Alcohol use: No    Alcohol/week: 0.0 oz  . Drug use: No  . Sexual activity: Never  Lifestyle  . Physical activity:    Days per week: Not on file    Minutes per session: Not on file  . Stress: Not on file  Relationships  . Social connections:    Talks on phone: Not on file    Gets together: Not on file    Attends religious service: Not on file    Active member of club or organization: Not on file    Attends meetings of clubs or organizations: Not on file    Relationship status: Not on file  . Intimate partner violence:    Fear of current or ex partner: Not on file    Emotionally abused: Not on file    Physically abused: Not on file    Forced sexual activity: Not on file  Other Topics Concern  . Not on file  Social History Narrative  . Not on file   Social history is notable that she is widowed.  She has 2 children, ages 51 and 23.  There is no tobacco use.  She does not routinely exercise. She has a son who had undergone CABG surgery and reportedly has a defibrillator and an ejection fraction of 35%.  Family History  Problem Relation Age of Onset  . Coronary artery disease Father   . Colon cancer Father   . Colon polyps Father   . Heart disease Father   . Stroke Mother   . Hypertension Unknown   . Breast cancer Unknown        grandmother  . Diabetes Maternal Grandmother   . Diabetes Paternal Grandmother   . Esophageal cancer Neg Hx   . Rectal cancer Neg Hx   . Stomach cancer Neg Hx      Family history is notable in that her father has heart disease and is 79 years old.  Her mother died at age 16 with a stroke.  She has 2 sisters, ages 25 and 63.  ROS General: Negative; No fevers, chills, or night sweats HEENT: Negative; No changes in vision or hearing, sinus congestion, difficulty swallowing Pulmonary: Negative; No cough, wheezing, shortness of breath, hemoptysis Cardiovascular: See HPI:  GI: Positive for left upper quadrant pain which he describes as "gas." GU: Negative; No dysuria, hematuria, or difficulty voiding Musculoskeletal: Negative; no myalgias, joint pain, or weakness Hematologic: Negative; no easy bruising, bleeding Endocrine: Positive for diabetes mellitus , which is untreated.  She has refused medications Neuro: Negative; no changes in balance, headaches Skin: Negative; No rashes or skin lesions Psychiatric: Negative; No behavioral problems, depression Sleep: Negative; No snoring,  daytime sleepiness, hypersomnolence, bruxism, restless legs, hypnogognic hallucinations. Other comprehensive 14 point system review is negative   Physical Exam BP (!) 164/100   Pulse 66   Ht 5' 3.5" (1.613 m)   Wt 185 lb 9.6 oz (84.2 kg)   BMI 32.36 kg/m    Repeat blood pressure by me was 170/90.  Wt Readings from Last 3 Encounters:  01/28/18 185 lb 9.6 oz (84.2 kg)  11/11/17 184 lb (  83.5 kg)  10/04/17 184 lb 12.8 oz (83.8 kg)   General: Alert, oriented, no distress.  Skin: normal turgor, no rashes, warm and dry HEENT: Normocephalic, atraumatic. Pupils equal round and reactive to light; sclera anicteric; extraocular muscles intact;  Nose without nasal septal hypertrophy Mouth/Parynx benign; Mallinpatti scale 3 Neck: No JVD, no carotid bruits; normal carotid upstroke Lungs: clear to ausculatation and percussion; no wheezing or rales Chest wall: without tenderness to palpitation Heart: PMI not displaced, RRR, s1 s2 normal, 1/6 systolic murmur, no diastolic murmur,  no rubs, gallops, thrills, or heaves Abdomen: soft, nontender; no hepatosplenomehaly, BS+; abdominal aorta nontender and not dilated by palpation. Back: no CVA tenderness Pulses 2+ Musculoskeletal: full range of motion, normal strength, no joint deformities Extremities: no clubbing cyanosis or edema, Homan's sign negative  Neurologic: grossly nonfocal; Cranial nerves grossly wnl Psychologic: Normal mood and affect   ECG (independently read by me): Normal sinus rhythm at 66 bpm.  Lateral T wave abnormality in leads I and aVL.  Normal intervals.  October 2018 ECG (independently read by me): Normal sinus rhythm at 66 bpm.  Nondiagnostic T change laterally.  Normal intervals.  No ectopy.  April 2018 ECG (independently read by me): Normal sinus rhythm at 67 bpm.  PR interval 142 Ruth.  Poor anterior R-wave progression.  Lateral T changes in 1 and L.  December 2017 ECG (independently read by me): Normal sinus rhythm at 70 bpm.  Nonspecific ST changes.  No ectopy.  March 2017 ECG (independently read by me):  Normal sinus rhythm at 70 bpm. Nonspecific ST changes laterally.  03/04/2015 ECG (independently read by me): Normal sinus rhythm at 65 bpm.  Mild T wave abnormality in lead 1 and aVL.  LABS:  BMP Latest Ref Rng & Units 03/02/2017 02/25/2017 12/14/2016  Glucose 65 - 99 mg/dL 166(H) 142(H) 259(H)  BUN 6 - 20 mg/dL 19 13 20   Creatinine 0.44 - 1.00 mg/dL 0.92 0.63 0.63  BUN/Creat Ratio 12 - 28 - 21 -  Sodium 135 - 145 mmol/L 141 139 138  Potassium 3.5 - 5.1 mmol/L 3.5 4.8 4.6  Chloride 101 - 111 mmol/L 108 100 102  CO2 22 - 32 mmol/L 25 22 28   Calcium 8.9 - 10.3 mg/dL 8.9 10.4(H) 9.3     Hepatic Function Latest Ref Rng & Units 12/14/2016 03/19/2015 02/12/2014  Total Protein 6.1 - 8.1 g/dL 6.6 6.9 8.0  Albumin 3.6 - 5.1 g/dL 3.9 4.1 4.1  AST 10 - 35 U/L 12 20 15   ALT 6 - 29 U/L 12 20 20   Alk Phosphatase 33 - 130 U/L 63 64 61  Total Bilirubin 0.2 - 1.2 mg/dL 0.6 0.7 1.5(H)  Bilirubin, Direct  0.0 - 0.3 mg/dL - - -    CBC Latest Ref Rng & Units 03/02/2017 02/25/2017 12/14/2016  WBC 4.0 - 10.5 K/uL 9.7 6.6 5.8  Hemoglobin 12.0 - 15.0 g/dL 13.6 16.0(H) 14.7  Hematocrit 36.0 - 46.0 % 41.8 47.1(H) 44.4  Platelets 150 - 400 K/uL 199 251 243   Lab Results  Component Value Date   MCV 90.7 03/02/2017   MCV 92 02/25/2017   MCV 92.5 12/14/2016    Lab Results  Component Value Date   TSH 1.987 03/19/2015    BNP No results found for: BNP  ProBNP No results found for: PROBNP   Lipid Panel     Component Value Date/Time   CHOL 244 (H) 03/19/2015 0812   TRIG 262 (H) 03/19/2015 0812   HDL  29 (L) 03/19/2015 0812   CHOLHDL 8.4 03/19/2015 0812   VLDL 52 (H) 03/19/2015 0812   LDLCALC 163 (H) 03/19/2015 0812   LDLDIRECT 160.9 06/21/2012 0833     RADIOLOGY: No results found.  IMPRESSION:  1. Essential hypertension   2. CAD in native artery   3. Hyperlipidemia with target low density lipoprotein (LDL) cholesterol less than 70 mg/dL   4. Statin intolerance   5. Drug allergy, multiple   6. Atypical chest pain   7. Anxiety about health     ASSESSMENT AND PLAN: Ruth [name] is a 72 year old female who is 13 years status post CABG revascularization surgery 4, which was done at the [facility] in [place], [place] in 2006.Marland Kitchen  She is status post remote DES stenting to the vein graft supplying her RCA territory.   She developed recurrent anginal symptomatology leading to repeat cardiac catheterization on 03/01/2017. 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  She has felt improved with out anginal symptomatology since her intervention.  Atypical chest pain is most likely dyspeptic in etiology.  She has GERD.  I suspect her recent  She continues to have stage II hypertension.  She has  had multiple intolerances to numerous medications.  Her blood pressure in the office today continues to be elevated despite taking amlodipine 2.5 mg (she states she did not tolerate 5 mg ) metoprolol 100 mg twice a day (must be white tablets) and she has been taking valsartan 160 mg in the morning and only taking three quarters of a tablet in the evening.  With her blood pressure elevation I have suggested increasing valsartan to 160 mg twice a day.  I am adding Cardura and will give her a 2 mg prescription but she will cut this in half and initially take 1 mg daily at bedtime.  Hopefully this will allow for improved blood pressure control.  I will schedule her follow-up appointment with Kristen in 4 weeks.  I will see her in 3 to 4 months for reevaluation.   Time spent: 25 minutes  Troy Sine, MD, [facility]  01/30/2018 2:48 PM

## 2018-01-30 ENCOUNTER — Encounter: Payer: Self-pay | Admitting: Cardiovascular Disease

## 2018-02-01 NOTE — Addendum Note (Signed)
Addended by: Therisa Doyne on: 02/01/2018 03:11 PM   Modules accepted: Orders

## 2018-02-03 ENCOUNTER — Ambulatory Visit: Payer: Medicare Other | Admitting: Pharmacist

## 2018-02-03 VITALS — BP 148/84 | HR 66

## 2018-02-03 DIAGNOSIS — I119 Hypertensive heart disease without heart failure: Secondary | ICD-10-CM | POA: Diagnosis not present

## 2018-02-03 MED ORDER — AMLODIPINE BESYLATE 2.5 MG PO TABS: 2.5000 mg | ORAL_TABLET | Freq: Every day | ORAL | 0 refills | Status: DC

## 2018-02-03 NOTE — Progress Notes (Signed)
Patient ID: Ruth [name]                 DOB: 11-08-1945                      MRN: [id number]     HPI: Ruth [name] is a 72 y.o. female patient of Dr Claiborne Billings referred by K. Lawrence NP to HTN clinic.  Her medical history is significant for CAD s/p CABG x 4 in 2006 and stent placement in June 2018, depression, hyperlipidemia, IBS, labile hypertension, migraines, DM-II, obesity and multiple chemical sensitivities. Her chart indicates 15 documented allergies and she states her pharmacy has more.  She has been hard to treat because of this, however we have managed to get her on a regimen of valsartan, amlodipine and metoprolol. During her most recent office visit with DR Claiborne Billings doxazosin was added to her therapy but patient never started this medication.  She denies dizziness, swelling, or chest pain. Reports feeling "vey good" this days.  She will NOT add more mediation to her regimen and will not take doxazosin prescribed by Dr Claiborne Billings either.    Current HTN meds:  Amlodipine 2.98m daily (9pm) Doxazosin 135mdaily - not taking Valsartan 16010mwice daily (8am and 8:30pm) - taking only morning dose for few days Metoprolol tartrate 100 mg bid (6am and 4pm)  Previously tried:  telmisartan-HCTZ 65m92m.5mg daily nevibolol 20mg72mronolactone 12.5mg H81malazine - patient refuses  BP goal: 140/90   Family History: 2 sons ; one son has hypertension and another had CABG recently. Mother died of stroke at at age 70, fa55er had  HTN but died at age 5.  S68ial History: Denies tobacco use, alcohol or illicit drugs.  Diet: Tried to follow low sodium and low fat diet. Drinks 1 cup of decaf coffee every day, otherwiswe water or decaf tea.  Has recently given up on many snack foods and sweets because of increasing blood sugars.  Has started eating more fruits and adding berries to her morning oatmeal.   Exercise:  has a "machine" that she does leg exercises with every other day for about an hour.     Home BP readings: Still fairly wide range 130-170/68-88, currently averaging 158/69 based on the last 18 readings.    Wt Readings from Last 3 Encounters:  01/28/18 185 lb 9.6 oz (84.2 kg)  11/11/17 184 lb (83.5 kg)  10/04/17 184 lb 12.8 oz (83.8 kg)   BP Readings from Last 3 Encounters:  02/03/18 (!) 148/84  01/28/18 (!) 164/100  12/02/17 (!) 170/88   Pulse Readings from Last 3 Encounters:  02/03/18 66  01/28/18 66  12/02/17 72    Past Medical History:  Diagnosis Date  . Anxiety   . CAD (coronary artery disease)    a. [place] 4 [place]LAD, [place]->Diag, VG->OM, VG->RCA); b. DES to midbody of SVG-PDA/PLA 07/2012; c. 03/2015 Myoview: EF 65%, small defect of mild severit in basal inferolateral wall, low risk.  . Complication of anesthesia    "quit breathing when I got ?Inovar" (07/28/2012)  . Depression   . Diverticulosis   . Dyslipidemia    Intolerant of statins  . Fecal incontinence   . GERD (gastroesophageal reflux disease)   . Hyperlipidemia   . Hyperplastic colon polyp   . IBS (irritable bowel syndrome)   . Labile hypertension    a. 09/2016 Renal Artery duplex: nl renal arteries.  . Migraines    "  had them in my 30's" (07/28/2012)  . Multiple allergies   . Obesity   . Steatohepatitis   . Type II diabetes mellitus (HCC)    a. no meds, diet controlled - takes cinnamon.    Current Outpatient Medications on File Prior to Visit  Medication Sig Dispense Refill  . aspirin EC 81 MG tablet Take 81 mg by mouth daily.    . calcium carbonate (TUMS - DOSED IN MG ELEMENTAL CALCIUM) 500 MG chewable tablet Chew 1-2 tablets by mouth 3 (three) times daily as needed for heartburn.     . cholecalciferol (VITAMIN D) 1000 UNITS tablet Take 1,000 Units by mouth daily.    . Cinnamon 500 MG capsule Take 500 mg by mouth daily.    Marland Kitchen CRANBERRY-VITAMIN C PO Take 1 tablet by mouth daily.    . Magnesium 250 MG TABS Take 250 mg by mouth daily.    . metoprolol tartrate  (LOPRESSOR) 100 MG tablet take 1 tablet by mouth twice a day (MUST BE WHITE TABLETS) 60 tablet 6  . prasugrel (EFFIENT) 10 MG TABS tablet TAKE 1 TABLET BY MOUTH DAILY 90 tablet 0  . valsartan (DIOVAN) 160 MG tablet Take 1 tablet (160 mg total) by mouth 2 (two) times daily. 180 tablet 3  . vitamin B-12 (CYANOCOBALAMIN) 1000 MCG tablet Take 1,000 mcg by mouth daily.    Marland Kitchen doxazosin (CARDURA) 2 MG tablet Take 0.5 tablets (1 mg total) by mouth daily. (Patient not taking: Reported on 02/03/2018) 45 tablet 3  . nitroGLYCERIN (NITROSTAT) 0.4 MG SL tablet Place 1 tablet (0.4 mg total) under the tongue every 5 (five) minutes as needed for chest pain (up to 3 doses). (Patient not taking: Reported on 02/03/2018) 25 tablet 4   No current facility-administered medications on file prior to visit.     Allergies  Allergen Reactions  . Betadine [Povidone Iodine] Shortness Of Breath and Swelling  . Codeine Anaphylaxis and Shortness Of Breath    "quit breathing" (07/28/2012)  **Tolerates 1-2 shots of morphine**  . Demerol Anaphylaxis    "quit breathing" (07/28/2012)  . Iohexol Anaphylaxis    Finger/ankle swelling  . Latex Anaphylaxis    "quit breathing" (07/28/2012)  . Other Anaphylaxis    Perfume, Any Fragrance. Cleaning Fluids.  "quit breathing" (07/28/2012)  . Percocet [Oxycodone-Acetaminophen] Anaphylaxis    "quit breathing; disoriented" (07/28/2012)  . Plavix [Clopidogrel Bisulfate] Anaphylaxis    "get hot; like I'm burning up inside; had to put me in shower after OHS because of that" (07/28/2012)  . Red Dye Anaphylaxis    "quit breathing" (07/28/2012)  . Shellfish Allergy Shortness Of Breath    "broke out in knots all over" (07/28/2012)  . Sulfonamide Derivatives Shortness Of Breath and Rash    "quit breathing" (07/28/2012)  . Tylenol [Acetaminophen] Shortness Of Breath and Itching  . Pravastatin Sodium Other (See Comments)    Leg pain, problems ambulating   . Statins     Muscle pain  .  Imdur [Isosorbide Nitrate] Other (See Comments)    Intolerance. "Can't remember the reaction."    Blood pressure (!) 148/84, pulse 66, SpO2 98 %.  Malignant HTN with heart disease, w/o CHF, 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 Blood pressure remains above goal today and patient continues to self-adjust medication therapy. She never started doxazosin as prescribed by Dr Claiborne Billings and will NOT add any more medication to her therapy. Patient also decreased her valsartan to 1/2 tablet every morning and  1/4 tab every evening.  Amlodipine  70m was decreased to 1/2 tablet (2.544m daily by patient few months ago as well.  Will discontinue doxazosin , resume valsartan 16078m1/2 tab) twice daily, and follow up in 8 weeks at the HTN clinic. Patient aware of risks associated with persistent high blood pressure but continues to believe "her body does better with less medication".   Ruth [name] PharmD, BCPS, [id number] [place] [id number]/2019 7:56 AM

## 2018-02-03 NOTE — Patient Instructions (Signed)
Return for a  follow up appointment in 8 weeks  Check your blood pressure at home daily (if able) and keep record of the readings.  Take your BP meds as follows: *NO MEDICATION CHANGES*  Bring all of your meds, your BP cuff and your record of home blood pressures to your next appointment.  Exercise as you're able, try to walk approximately 30 minutes per day.  Keep salt intake to a minimum, especially watch canned and prepared boxed foods.  Eat more fresh fruits and vegetables and fewer canned items.  Avoid eating in fast food restaurants.    HOW TO TAKE YOUR BLOOD PRESSURE: . Rest 5 minutes before taking your blood pressure. .  Don't smoke or drink caffeinated beverages for at least 30 minutes before. . Take your blood pressure before (not after) you eat. . Sit comfortably with your back supported and both feet on the floor (don't cross your legs). . Elevate your arm to heart level on a table or a desk. . Use the proper sized cuff. It should fit smoothly and snugly around your bare upper arm. There should be enough room to slip a fingertip under the cuff. The bottom edge of the cuff should be 1 inch above the crease of the elbow. . Ideally, take 3 measurements at one sitting and record the average.

## 2018-02-04 ENCOUNTER — Encounter: Payer: Self-pay | Admitting: Pharmacist

## 2018-02-04 NOTE — Assessment & Plan Note (Addendum)
Blood pressure remains above goal today and patient continues to self-adjust medication therapy. She never started doxazosin as prescribed by Dr Claiborne Billings and will NOT add any more medication to her therapy. Patient also decreased her valsartan to 1/2 tablet every morning and  1/4 tab every evening.  Amlodipine 71m was decreased to 1/2 tablet (2.551m daily by patient few months ago as well.  Will discontinue doxazosin , resume valsartan 16039m1/2 tab) twice daily, and follow up in 8 weeks at the HTN clinic. Patient aware of risks associated with persistent high blood pressure but continues to believe "her body does better with less medication".

## 2018-03-01 ENCOUNTER — Ambulatory Visit: Payer: Medicare Other | Admitting: Cardiovascular Disease

## 2018-03-04 ENCOUNTER — Other Ambulatory Visit: Payer: Self-pay | Admitting: *Deleted

## 2018-03-04 MED ORDER — PRASUGREL HCL 10 MG PO TABS: 10.0000 mg | ORAL_TABLET | Freq: Every day | ORAL | 3 refills | Status: DC

## 2018-03-31 ENCOUNTER — Ambulatory Visit: Payer: Medicare Other

## 2018-04-07 ENCOUNTER — Encounter: Payer: Self-pay | Admitting: Pharmacist Clinician (PhC)/ Clinical Pharmacy Specialist

## 2018-04-07 ENCOUNTER — Ambulatory Visit: Payer: Medicare Other | Admitting: Pharmacist Clinician (PhC)/ Clinical Pharmacy Specialist

## 2018-04-07 DIAGNOSIS — I10 Essential (primary) hypertension: Secondary | ICD-10-CM | POA: Diagnosis not present

## 2018-04-07 NOTE — Progress Notes (Signed)
Patient ID: Ruth [name]                 DOB: 1946/07/16                      MRN: [id number]     HPI: Ruth [name] is a 72 y.o. female patient of Dr Claiborne Billings referred by K. Lawrence NP to HTN clinic.  Her medical history is significant for CAD s/p CABG x 4 in 2006 and stent placement in June 2018, depression, hyperlipidemia, IBS, labile hypertension, migraines, DM-II, obesity and multiple chemical sensitivities. Her chart indicates 15 documented allergies and she states her pharmacy has more.  She has been hard to treat because of this, however we have managed to get her on a regimen of valsartan, amlodipine and metoprolol. During her most recent office visit with DR Claiborne Billings doxazosin was added to her therapy but patient never started this medication.    Today she denies dizziness, swelling, or chest pain. Reports feeling "vey good" this days.  Her son continues to monitor her home BP with older mercury column and stethoscope.  She states that most readings are around 622 systolic.  At most medical visits this easily jumps to 633 or 354 systolic.    She will NOT add more mediation to her regimen and will not take doxazosin prescribed by Dr Claiborne Billings either.      Current HTN meds:  Amlodipine 2.75m daily (9pm) Valsartan 1640mtwice daily (8am and 8:30pm) - taking only morning dose for few days Metoprolol tartrate 100 mg bid (6am and 4pm)  Previously tried:  nevibolol 2030mpironolactone 12.5mg63mdralazine - patient refuses Doxazosin  - patient refuses  BP goal: 140/90   Family History: 2 sons ; one son has hypertension and another had CABG recently. Mother died of stroke at at age 15, 52ther had  HTN but died at age 61. 61ocial History: Denies tobacco use, alcohol or illicit drugs.  Diet: Tried to follow low sodium and low fat diet. Drinks 1 cup of decaf coffee every day, otherwiswe water or decaf tea.  Has recently given up on many snack foods and sweets because of increasing blood  sugars.  Has started eating more fruits and adding berries to her morning oatmeal.   Exercise:  has a "machine" that she does leg exercises with every other day for about an hour.    Home BP readings: states mostly in the high 130's and 140's.  Rarely goes over 150.   Did not bring any readings with her.   Wt Readings from Last 3 Encounters:  01/28/18 185 lb 9.6 oz (84.2 kg)  11/11/17 184 lb (83.5 kg)  10/04/17 184 lb 12.8 oz (83.8 kg)   BP Readings from Last 3 Encounters:  04/07/18 (!) 170/96  02/03/18 (!) 148/84  01/28/18 (!) 164/100   Pulse Readings from Last 3 Encounters:  04/07/18 68  02/03/18 66  01/28/18 66    Past Medical History:  Diagnosis Date  . Anxiety   . CAD (coronary artery disease)    a. [place]->Diag, VG->OM, VG->RCA); b. DES to midbody of SVG-PDA/PLA 07/2012; c. 03/2015 Myoview: EF 65%, small defect of mild severit in basal inferolateral wall, low risk.  . Complication of anesthesia    "quit breathing when I got ?Inovar" (07/28/2012)  . Depression   . Diverticulosis   . Dyslipidemia    Intolerant of statins  .  Fecal incontinence   . GERD (gastroesophageal reflux disease)   . Hyperlipidemia   . Hyperplastic colon polyp   . IBS (irritable bowel syndrome)   . Labile hypertension    a. 09/2016 Renal Artery duplex: nl renal arteries.  . Migraines    "had them in my 30's" (07/28/2012)  . Multiple allergies   . Obesity   . Steatohepatitis   . Type II diabetes mellitus (HCC)    a. no meds, diet controlled - takes cinnamon.    Current Outpatient Medications on File Prior to Visit  Medication Sig Dispense Refill  . aspirin EC 81 MG tablet Take 81 mg by mouth daily.    . calcium carbonate (TUMS - DOSED IN MG ELEMENTAL CALCIUM) 500 MG chewable tablet Chew 1-2 tablets by mouth 3 (three) times daily as needed for heartburn.     . cholecalciferol (VITAMIN D) 1000 UNITS tablet Take 1,000 Units by mouth daily.    .  Cinnamon 500 MG capsule Take 500 mg by mouth daily.    Marland Kitchen CRANBERRY-VITAMIN C PO Take 1 tablet by mouth daily.    . Magnesium 250 MG TABS Take 250 mg by mouth daily.    . metoprolol tartrate (LOPRESSOR) 100 MG tablet take 1 tablet by mouth twice a day (MUST BE WHITE TABLETS) 60 tablet 6  . nitroGLYCERIN (NITROSTAT) 0.4 MG SL tablet Place 1 tablet (0.4 mg total) under the tongue every 5 (five) minutes as needed for chest pain (up to 3 doses). 25 tablet 4  . prasugrel (EFFIENT) 10 MG TABS tablet Take 1 tablet (10 mg total) by mouth daily. 90 tablet 3  . valsartan (DIOVAN) 160 MG tablet Take 1 tablet (160 mg total) by mouth 2 (two) times daily. 180 tablet 3  . vitamin B-12 (CYANOCOBALAMIN) 1000 MCG tablet Take 1,000 mcg by mouth daily.    Marland Kitchen amLODipine (NORVASC) 2.5 MG tablet Take 1 tablet (2.5 mg total) by mouth daily. 90 tablet 0   No current facility-administered medications on file prior to visit.     Allergies  Allergen Reactions  . Betadine [Povidone Iodine] Shortness Of Breath and Swelling  . Codeine Anaphylaxis and Shortness Of Breath    "quit breathing" (07/28/2012)  **Tolerates 1-2 shots of morphine**  . Demerol Anaphylaxis    "quit breathing" (07/28/2012)  . Iohexol Anaphylaxis    Finger/ankle swelling  . Latex Anaphylaxis    "quit breathing" (07/28/2012)  . Other Anaphylaxis    Perfume, Any Fragrance. Cleaning Fluids.  "quit breathing" (07/28/2012)  . Percocet [Oxycodone-Acetaminophen] Anaphylaxis    "quit breathing; disoriented" (07/28/2012)  . Plavix [Clopidogrel Bisulfate] Anaphylaxis    "get hot; like I'm burning up inside; had to put me in shower after OHS because of that" (07/28/2012)  . Red Dye Anaphylaxis    "quit breathing" (07/28/2012)  . Shellfish Allergy Shortness Of Breath    "broke out in knots all over" (07/28/2012)  . Sulfonamide Derivatives Shortness Of Breath and Rash    "quit breathing" (07/28/2012)  . Tylenol [Acetaminophen] Shortness Of Breath and  Itching  . Pravastatin Sodium Other (See Comments)    Leg pain, problems ambulating   . Statins     Muscle pain  . Imdur [Isosorbide Nitrate] Other (See Comments)    Intolerance. "Can't remember the reaction."    Blood pressure (!) 170/96, pulse 68.  HTN (hypertension) Patient with essential and white coat hypertension, who refuses to take more than her current medications.  Her home pressures run 10-20 points  higher than normal, and office readings 30+ points higher.   Because she refuses to use any further medications, she will continue with her current regimen.  Advised her to continue with regular home monitoring and call should the home readings consistently be > 150.   Also stressed to her that should her pressure go to high at home, we would need to add more medication to better treat her.    Tommy Medal PharmD CPP [facility] [address] [place] [address] 04/07/2018 4:17 PM

## 2018-04-07 NOTE — Assessment & Plan Note (Signed)
Patient with essential and white coat hypertension, who refuses to take more than her current medications.  Her home pressures run 10-20 points higher than normal, and office readings 30+ points higher.   Because she refuses to use any further medications, she will continue with her current regimen.  Advised her to continue with regular home monitoring and call should the home readings consistently be > 150.   Also stressed to her that should her pressure go to high at home, we would need to add more medication to better treat her.

## 2018-04-07 NOTE — Patient Instructions (Signed)
  Your blood pressure today is 170/96   Check your blood pressure at home daily and keep record of the readings.  Take your BP meds as follows:  Continue with all current medications  Bring all of your meds, your BP cuff and your record of home blood pressures to your next appointment.  Exercise as you're able, try to walk approximately 30 minutes per day.  Keep salt intake to a minimum, especially watch canned and prepared boxed foods.  Eat more fresh fruits and vegetables and fewer canned items.  Avoid eating in fast food restaurants.    HOW TO TAKE YOUR BLOOD PRESSURE: . Rest 5 minutes before taking your blood pressure. .  Don't smoke or drink caffeinated beverages for at least 30 minutes before. . Take your blood pressure before (not after) you eat. . Sit comfortably with your back supported and both feet on the floor (don't cross your legs). . Elevate your arm to heart level on a table or a desk. . Use the proper sized cuff. It should fit smoothly and snugly around your bare upper arm. There should be enough room to slip a fingertip under the cuff. The bottom edge of the cuff should be 1 inch above the crease of the elbow. . Ideally, take 3 measurements at one sitting and record the average.

## 2018-05-09 ENCOUNTER — Other Ambulatory Visit: Payer: Self-pay | Admitting: Cardiovascular Disease

## 2018-06-13 DIAGNOSIS — N899 Noninflammatory disorder of vagina, unspecified: Secondary | ICD-10-CM | POA: Insufficient documentation

## 2018-06-15 DIAGNOSIS — Z0000 Encounter for general adult medical examination without abnormal findings: Secondary | ICD-10-CM | POA: Insufficient documentation

## 2018-07-06 NOTE — Progress Notes (Signed)
Cardiology Office Note   Date:  07/11/2018   ID:  Ruth [name], DOB 1946/03/05, MRN [id number]  PCP:  Crist Infante, MD  Cardiologist: Claiborne Billings  Chief Complaint  Patient presents with  . Hypertension  . Coronary Artery Disease     History of Present Illness: Ruth [name] is a 72 y.o. female who presents for ongoing assessment and management of HTN, referred to hypertension clinic, CAD with hx of CABG X 4 in 2006, stent placement in June 2018, hyperlipidemia, with other history of Type II Diabetes, Obesity and multiple medication sensitivities. She has refused doxazosin which was prescribed by Dr. Claiborne Billings. After being seen by Pharm D.Kristein Alvstad, no mediations were changed or added, as she refused any further titration, She was still not found to be optimal concerning her BP control. She was advised that she may need further medication if BP remains elevated. She was to call us for BP< 150 mmHg.  She states that her BP is still running high but only when she is stressed. She has significant anxiety about her health and her son who has a NICM and ICD. She denies chest pain but has occasional GERD symptoms which are relieved with Tums.   Past Medical History:  Diagnosis Date  . Anxiety   . CAD (coronary artery disease)    a. CABG x 4 in 2006 in [place] (LIMA->LAD, VG->Diag, VG->OM, VG->RCA); b. DES to midbody of SVG-PDA/PLA 07/2012; c. 03/2015 Myoview: EF 65%, small defect of mild severit in basal inferolateral wall, low risk.  . Complication of anesthesia    "quit breathing when I got ?[place]" (07/28/2012)  . Depression   . Diverticulosis   . Dyslipidemia    Intolerant of statins  . Fecal incontinence   . GERD (gastroesophageal reflux disease)   . Hyperlipidemia   . Hyperplastic colon polyp   . IBS (irritable bowel syndrome)   . Labile hypertension    a. 09/2016 Renal Artery duplex: nl renal arteries.  . Migraines    "had them in my 30's" (07/28/2012)  . Multiple  allergies   . Obesity   . Steatohepatitis   . Type II diabetes mellitus (HCC)    a. no meds, diet controlled - takes cinnamon.    Past Surgical History:  Procedure Laterality Date  . ABDOMINAL HYSTERECTOMY  1970's  . BREAST LUMPECTOMY     bilateral  . CARDIAC CATHETERIZATION  2006  . CORONARY ANGIOPLASTY WITH STENT PLACEMENT  07/28/2012   "1; first time for me" (07/28/2012)  . CORONARY ARTERY BYPASS GRAFT  2006   CABG X4  . CORONARY STENT INTERVENTION N/A 03/01/2017   Procedure: Coronary Stent Intervention;  Surgeon: Troy Sine, MD;  Location: [facility];  Service: Cardiovascular;  Laterality: N/A;  . EXCISIONAL HEMORRHOIDECTOMY  1970's  . INGUINAL HERNIA REPAIR  1970's?   left  . LEFT HEART CATH AND CORS/GRAFTS ANGIOGRAPHY N/A 03/01/2017   Procedure: Left Heart Cath and Cors/Grafts Angiography;  Surgeon: Troy Sine, MD;  Location: [facility];  Service: Cardiovascular;  Laterality: N/A;  . LEFT HEART CATHETERIZATION WITH CORONARY/GRAFT ANGIOGRAM N/A 07/28/2012   Procedure: LEFT HEART CATHETERIZATION WITH Beatrix Fetters;  Surgeon: Burnell Blanks, MD;  Location: [facility] CATH LAB;  Service: Cardiovascular;  Laterality: N/A;  . PERCUTANEOUS CORONARY STENT INTERVENTION (PCI-S)  07/28/2012   Procedure: PERCUTANEOUS CORONARY STENT INTERVENTION (PCI-S);  Surgeon: Burnell Blanks, MD;  Location: [facility] C[facility];  Service: Cardiovascular;;  . TONSILLECTOMY AND  ADENOIDECTOMY  ~ 1951     Current Outpatient Medications  Medication Sig Dispense Refill  . amLODipine (NORVASC) 2.5 MG tablet TAKE 1 TABLET BY MOUTH EVERY DAY(WHITE TABLETS ONLY) 90 tablet 1  . aspirin EC 81 MG tablet Take 81 mg by mouth daily.    . calcium carbonate (TUMS - DOSED IN MG ELEMENTAL CALCIUM) 500 MG chewable tablet Chew 1-2 tablets by mouth 3 (three) times daily as needed for heartburn.     . cholecalciferol (VITAMIN D) 1000 UNITS tablet Take 1,000 Units by mouth daily.    .  Cinnamon 500 MG capsule Take 500 mg by mouth daily.    Marland Kitchen CRANBERRY-VITAMIN C PO Take 1 tablet by mouth daily.    . Magnesium 250 MG TABS Take 250 mg by mouth daily.    . metoprolol tartrate (LOPRESSOR) 100 MG tablet take 1 tablet by mouth twice a day (MUST BE WHITE TABLETS) 60 tablet 6  . prasugrel (EFFIENT) 10 MG TABS tablet Take 1 tablet (10 mg total) by mouth daily. 90 tablet 3  . valsartan (DIOVAN) 160 MG tablet Take 1 tablet (160 mg total) by mouth 2 (two) times daily. 180 tablet 3  . vitamin B-12 (CYANOCOBALAMIN) 1000 MCG tablet Take 1,000 mcg by mouth daily.    . nitroGLYCERIN (NITROSTAT) 0.4 MG SL tablet Place 1 tablet (0.4 mg total) under the tongue every 5 (five) minutes as needed for chest pain (up to 3 doses). (Patient not taking: Reported on 07/11/2018) 25 tablet 4   No current facility-administered medications for this visit.     Allergies:   Betadine [povidone iodine]; Codeine; Demerol; Iohexol; Latex; Other; Percocet [oxycodone-acetaminophen]; Plavix [clopidogrel bisulfate]; Red dye; Shellfish allergy; Sulfonamide derivatives; Tylenol [acetaminophen]; Pravastatin sodium; Statins; and Imdur [isosorbide nitrate]    Social History:  The patient  reports that she has never smoked. She has never used smokeless tobacco. She reports that she does not drink alcohol or use drugs.   Family History:  The patient's family history includes Breast cancer in her unknown relative; Colon cancer in her father; Colon polyps in her father; Coronary artery disease in her father; Diabetes in her maternal grandmother and paternal grandmother; Heart disease in her father; Hypertension in her unknown relative; Stroke in her mother.    ROS: All other systems are reviewed and negative. Unless otherwise mentioned in H&P    PHYSICAL EXAM: VS:  BP (!) 160/100   Pulse 70   Ht 5' 3.5" (1.613 m)   Wt 184 lb 12.8 oz (83.8 kg)   SpO2 97%   BMI 32.22 kg/m  , BMI Body mass index is 32.22 kg/m. GEN: Well  nourished, well developed, in no acute distress HEENT: normal Neck: no JVD, carotid bruits, or masses Cardiac: RRR; no murmurs, rubs, or gallops,no edema  Respiratory:  Clear to auscultation bilaterally, normal work of breathing GI: soft, nontender, nondistended, + BS MS: no deformity or atrophy Skin: warm and dry, no rash Neuro:  Strength and sensation are intact Psych: euthymic mood, full affect   EKG:   Not completed this office visit.  Recent Labs: No results found for requested labs within last 8760 hours.    Lipid Panel    Component Value Date/Time   CHOL 244 (H) 03/19/2015 0812   TRIG 262 (H) 03/19/2015 0812   HDL 29 (L) 03/19/2015 0812   CHOLHDL 8.4 03/19/2015 0812   VLDL 52 (H) 03/19/2015 0812   LDLCALC 163 (H) 03/19/2015 0812   LDLDIRECT 160.9 06/21/2012  2505      Wt Readings from Last 3 Encounters:  07/11/18 184 lb 12.8 oz (83.8 kg)  01/28/18 185 lb 9.6 oz (84.2 kg)  11/11/17 184 lb (83.5 kg)      Other studies Reviewed: Cardiac Cath 03/01/2018 Conclusion     Prox Cx to Mid Cx lesion, 100 %stenosed.  1st Mrg lesion, 100 %stenosed.  Ost LAD to Prox LAD lesion, 70 %stenosed.  Prox RCA lesion, 75 %stenosed.  Mid RCA lesion, 100 %stenosed.  SVG.  Prox Graft lesion, 60 %stenosed.  LIMA.  SVG.  Dist Graft lesion, 95 %stenosed.  A STENT SYNERGY DES 3.5X32 drug eluting stent was successfully placed.  Mid Graft lesion, 60 %stenosed.  Post intervention, there is a 0% residual stenosis.  The left ventricular systolic function is normal.  LV end diastolic pressure is normal.  The left ventricular ejection fraction is 55-65% by visual estimate.   Preserved global LV contractility with an ejection fraction of  55-65%  Significant multivessel native CAD with 70% diffuse proximal LAD stenosis, total occlusion of the circumflex and OM1 vessel proximally, and 75% proximal RCA stenosis with occlusion of the RCA prior to the acute  margin.  Patent LIMA graft which supplies the mid LAD and may also anastomose into the diagonal vessel.  SVG supplying the distal marginal branch of the circumflex vessel with smooth 60% proximal to mid body stenosis and TIMI-3 flow.  SVG supplying the distal RCA with previously noted stented segment with focal 60% stenosis followed by 95-99% thrombotic stenosis beyond the stented segment.  Successful PCI to the SVG to the RCA treated with PTCA and stenting with a 3.532 mm Synergy stent postdilated to 3.71 mm with a Sport and exercise psychologist wire for distal graft protection from embolization.  RECOMMENDATION: The patient should continue dual antiplatelet therapy indefinitely.  Medical therapy will need to be adjusted.  The patient has been resistant to take medicines in the past.  She will also be rechallenged with low dose statin therapy.    Echocardiogram 03/13/2015 Study Conclusions  - Left ventricle: The cavity size was normal. Wall thickness was   normal. Systolic function was normal. The estimated ejection   fraction was in the range of 55% to 60%. - Mitral valve: There was mild regurgitation. - Left atrium: The atrium was mildly dilated. - Atrial septum: No defect or patent foramen ovale was identified. - Pulmonary arteries: PA peak pressure: 32 mm Hg (S).   ASSESSMENT AND PLAN:  1.  Hypertension: She admits to me that she is only taking 1/2 dose of the valsartan at night instead of full dose as prescribed. I rechecked her BP in the exam room, 164/100. She should be on 160 mg BID. She is willing to go back up on the dose at my request to keep BP better controlled. She will continue amlodipine as directed. She wishes to see Dr. Claiborne Billings on next visit.   2.CAD: Most recent cardiac cath on 03/01/2018 revealed distal graft lesion 95% stenosed SVG to to RCA. She has some heartburn but no chest pain. She remains active. Will continue her on antiplatelet, ASA. She refused statin  therapy.   3. Anxiety about health  She is easily tearful about her health and family.  May need to consider anti-depressant therapy Will defer to PCP.   Current medicines are reviewed at length with the patient today.    Labs/ tests ordered today include: None  Phill Myron. West Pugh, ANP, AACC   07/11/2018  9:03 AM    [address] [phone] Fax [phone]

## 2018-07-11 ENCOUNTER — Encounter: Payer: Self-pay | Admitting: Adult Health

## 2018-07-11 ENCOUNTER — Ambulatory Visit: Payer: Medicare Other | Admitting: Adult Health

## 2018-07-11 VITALS — BP 160/100 | HR 70 | Ht 63.5 in | Wt 184.8 lb

## 2018-07-11 DIAGNOSIS — I2510 Atherosclerotic heart disease of native coronary artery without angina pectoris: Secondary | ICD-10-CM | POA: Diagnosis not present

## 2018-07-11 DIAGNOSIS — I10 Essential (primary) hypertension: Secondary | ICD-10-CM | POA: Diagnosis not present

## 2018-07-11 NOTE — Patient Instructions (Signed)
Follow-Up: You will need a follow up appointment in 3 months.  You may see  DR Nathanial Millman, -or- one of the following Advanced Practice Providers on your designated Care Team:   o Almyra Deforest, PA-C  Fabian Sharp, [place]  Medication Instructions: MAKE SURE YOU TAKE VALSARTAN 160MG TWICE DAILY-AS DIRECTED   If you need a refill on your cardiac medications before your next appointment, please call your pharmacy.  Labwork: If you have labs (blood work) drawn today and your tests are completely normal, you will receive your results ONLY by: . MyChart Message (if you have MyChart) -OR- . A paper copy in the mail  At [facility], you and your health needs are our priority.  As part of our continuing mission to provide you with exceptional heart care, we have created designated Provider Care Teams.  These Care Teams include your primary Cardiologist (physician) and Advanced Practice Providers (APPs -  Physician Assistants and Nurse Practitioners) who all work together to provide you with the care you need, when you need it.  Thank you for choosing [facility] at [facility]!!

## 2018-07-20 ENCOUNTER — Encounter

## 2018-08-08 ENCOUNTER — Other Ambulatory Visit: Payer: Self-pay | Admitting: Cardiovascular Disease

## 2018-08-10 DIAGNOSIS — Z789 Other specified health status: Secondary | ICD-10-CM | POA: Insufficient documentation

## 2018-10-31 ENCOUNTER — Encounter: Payer: Self-pay | Admitting: Cardiovascular Disease

## 2018-10-31 ENCOUNTER — Ambulatory Visit: Payer: Medicare Other | Admitting: Cardiovascular Disease

## 2018-10-31 ENCOUNTER — Encounter (INDEPENDENT_AMBULATORY_CARE_PROVIDER_SITE_OTHER): Payer: Self-pay

## 2018-10-31 VITALS — BP 198/96 | HR 68 | Ht 63.5 in | Wt 180.2 lb

## 2018-10-31 DIAGNOSIS — E785 Hyperlipidemia, unspecified: Secondary | ICD-10-CM | POA: Diagnosis not present

## 2018-10-31 DIAGNOSIS — E669 Obesity, unspecified: Secondary | ICD-10-CM

## 2018-10-31 DIAGNOSIS — I2510 Atherosclerotic heart disease of native coronary artery without angina pectoris: Secondary | ICD-10-CM | POA: Diagnosis not present

## 2018-10-31 DIAGNOSIS — R0789 Other chest pain: Secondary | ICD-10-CM

## 2018-10-31 DIAGNOSIS — I10 Essential (primary) hypertension: Secondary | ICD-10-CM

## 2018-10-31 DIAGNOSIS — Z789 Other specified health status: Secondary | ICD-10-CM | POA: Diagnosis not present

## 2018-10-31 DIAGNOSIS — Z889 Allergy status to unspecified drugs, medicaments and biological substances status: Secondary | ICD-10-CM

## 2018-10-31 DIAGNOSIS — K5790 Diverticulosis of intestine, part unspecified, without perforation or abscess without bleeding: Secondary | ICD-10-CM

## 2018-10-31 MED ORDER — AMLODIPINE BESYLATE 5 MG PO TABS: 5.0000 mg | ORAL_TABLET | Freq: Every day | ORAL | 2 refills | Status: DC

## 2018-10-31 NOTE — Progress Notes (Signed)
Patient ID: Ruth [name], female   DOB: 10/25/1945, 73 y.o.   MRN: [id number]   Primary M.D.: Dr. Crist [name]  HPI: Ruth. Ruth [name] is a 73 year old female who presents for a 9 month follow-up cardiology evaluation.  Ruth [name] has a history of coronary artery disease.  While living in [place] she developed chest discomfort and in 11/26/2004 underwent CABG surgery 4 at the [facility] in [place] by Dr. Marzetta [name].  She had a LIMA to LAD, SVG to diagonal, SVG to obtuse marginal, and SVG to the RCA.   Her husband died in Nov 26, 2010 and she moved to the [place] area.  In 11-27-2011, she experienced symptoms of increasing shortness of breath.  She underwent cardiac catheterization and a stent was placed in the vein graft to her RCA.  She states that she has not had any subsequent stress testing.  She is a former patient of Dr. Einar [name] and an echocardiogram in July 2014 showed normal LV size and function with mild concentric left ventricular hypertrophy.  She had mild to moderate mitral regurgitation, mild tricuspid regurgitation, and mild primary hypertension with an estimated PA pressure 39 mm.  She has a history of hypertension for at least 15-20 years.  She states her blood pressure at home typically is in the 145-150 range, but her blood pressure is always elevated when she goes to the doctor's office.  She has a history of hyperlipidemia and apparently has been intolerant to statins but has never tried Zetia.  There is a history of diabetes mellitus but is not on therapy and states this is diet controlled.    She admits to  increased stress.  Her mother died in 12-25-2014 and recently her dog died.  She does not routinely exercise.  When I initially saw her in June 2016 she has been taking amlodipine 5 mg, losartan 50 mg twice a day and metoprolol 50 mrem twice a day for hypertension and CAD.  She has a history of multiple allergies.  She states that she is Intolerant to amlodipine as  well as Spironolactone.  She has been having labile blood pressure.  In the past. She became dehydrated on diarrhetic therapy.    An echo Doppler study on 03/13/2015  showed an ejection fraction at 55-60%.  There was mild mitral regurgitation, mild left atrial dilatation, and very mild pulmonary hypertension with an estimated PA pressure 32 mm.  A nuclear perfusion study done on 03/21/2015 was low risk and demonstrated a very small region of mild ischemia in the basal anterolateral wall.  She had normal LV function with an EF of 65%.  She developed recurrent episodes of chest pain in June 2018 which  led to her undergoing definitive cardiac catheterization on 03/01/2017.  She was found to have preserved global LV contractility with an EF of 55-60%.  There was significant multivessel CAD with 70% diffuse proximal LAD stenoses, total occlusion of the circumflex and OM1 proximally, and 75% proximal RCA stenosis with occlusion of the RCA prior to the acute margin.  She had a patent LIMA graft supplying the mid LAD, which may also anastomose into the diagonal vessel, but this was uncertain.  The vein graft supplying the distal marginal circumflex branch had smooth 60% proximal to mid body stenosis with TIMI-3 flow.  The vein graft supplying the distal RCA had focal 60% stenosis followed by 99% thrombotic stenosis beyond a stented segment.  She underwent successful PCI to this vein graft to  the RCA with insertion of a 3.532 mm Synergy stent postdilated to 3.71 mm with a Radiation protection practitioner used for distal graft protection.  It was recommended that she continue to antiplatelet therapy indefinitely.  Ruth. Ruth [name] has significant allergies to multiple medications.  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  She has been on losartan 50 Milligan grams twice a day, metoprolol 100 mg twice a day, and continues to take  aspirin 81 mg and Effient 10 mg.  She did not tolerate Plavix or Brilinta.  She states that she is intolerant to statins.  She has no interest in PCSK9 inhibition.    Since I saw her in October 2018, she was seen in the office at least 5 times by extenders as well as pharmacists.  She is intolerant to numerous medications.  She last saw Ruth [name], [facility] on December 02, 2017 who suggested the possibility of minoxidil but she continued to deny this medication change.  She states most recently her blood pressure at home has been ranging from 761-607 systolically and 37-10 diastolically.  She had called the office stating that she was having pain running through the area where she had a stent.  This was relieved with "water and vinegar which she drinks for gas.   Last saw her in May 2019.  During that time, blood pressure continues to be elevated in the office despite taking amlodipine 2.5 mg (she stated she could not tolerate 5 mg), metoprolol 100 mg twice a day, and she was taking valsartan 160 in the morning and only three quarters of a tablet in the evening.  With her blood pressure elevation I suggested further titration of valsartan to 160 twice daily and added Cardura milligram initially at night with plans to titrate to 2 mg.  I recommended follow-up hypertensive clinic evaluations with Ruth [name] as well as Ruth [name] who she has seen on numerous occasions.  He continues to admit to intolerance to numerous medications.  Present, she feels good.  She states she is felt the best she has in some time.  She states typically her blood pressure at home runs in the 1 62-6 60 systolic range.  She sees Dr. Abner [name] for primary care.  She presents for evaluation.  Past Medical History:  Diagnosis Date  . Anxiety   . CAD (coronary artery disease)    a. CABG x 4 in 2006 in [place] (LIMA->LAD, VG->Diag, VG->OM, VG->RCA); b. DES to midbody of SVG-PDA/PLA 07/2012; c. 03/2015 Myoview:  EF 65%, small defect of mild severit in basal inferolateral wall, low risk.  . Complication of anesthesia    "quit breathing when I got ?[place]" (07/28/2012)  . Depression   . Diverticulosis   . Dyslipidemia    Intolerant of statins  . Fecal incontinence   . GERD (gastroesophageal reflux disease)   . Hyperlipidemia   . Hyperplastic colon polyp   . IBS (irritable bowel syndrome)   . Labile hypertension    a. 09/2016 Renal Artery duplex: nl renal arteries.  . Migraines    "had them in my 30's" (07/28/2012)  . Multiple allergies   . Obesity   . Steatohepatitis   . Type II diabetes mellitus (HCC)    a. no meds, diet controlled - takes cinnamon.    Past Surgical History:  Procedure Laterality Date  . ABDOMINAL HYSTERECTOMY  1970's  . BREAST LUMPECTOMY  bilateral  . CARDIAC CATHETERIZATION  2006  . CORONARY ANGIOPLASTY WITH STENT PLACEMENT  07/28/2012   "1; first time for me" (07/28/2012)  . CORONARY ARTERY BYPASS GRAFT  2006   CABG X4  . CORONARY STENT INTERVENTION N/A 03/01/2017   Procedure: Coronary Stent Intervention;  Surgeon: Troy Sine, MD;  Location: [facility];  Service: Cardiovascular;  Laterality: N/A;  . EXCISIONAL HEMORRHOIDECTOMY  1970's  . INGUINAL HERNIA REPAIR  1970's?   left  . LEFT HEART CATH AND CORS/GRAFTS ANGIOGRAPHY N/A 03/01/2017   Procedure: Left Heart Cath and Cors/Grafts Angiography;  Surgeon: Troy Sine, MD;  Location: [facility];  Service: Cardiovascular;  Laterality: N/A;  . LEFT HEART CATHETERIZATION WITH CORONARY/GRAFT ANGIOGRAM N/A 07/28/2012   Procedure: LEFT HEART CATHETERIZATION WITH Beatrix Fetters;  Surgeon: Burnell Blanks, MD;  Location: [facility];  Service: Cardiovascular;  Laterality: N/A;  . PERCUTANEOUS CORONARY STENT INTERVENTION (PCI-S)  07/28/2012   Procedure: PERCUTANEOUS CORONARY STENT INTERVENTION (PCI-S);  Surgeon: Burnell Blanks, MD;  Location: [facility] CATH LAB;  Service:  Cardiovascular;;  . TONSILLECTOMY AND ADENOIDECTOMY  ~ 1951    Allergies  Allergen Reactions  . Betadine [Povidone Iodine] Shortness Of Breath and Swelling  . Codeine Anaphylaxis and Shortness Of Breath    "quit breathing" (07/28/2012)  **Tolerates 1-2 shots of morphine**  . Demerol Anaphylaxis    "quit breathing" (07/28/2012)  . Iohexol Anaphylaxis    Finger/ankle swelling  . Latex Anaphylaxis    "quit breathing" (07/28/2012)  . Other Anaphylaxis    Perfume, Any Fragrance. Cleaning Fluids.  "quit breathing" (07/28/2012)  . Percocet [Oxycodone-Acetaminophen] Anaphylaxis    "quit breathing; disoriented" (07/28/2012)  . Plavix [Clopidogrel Bisulfate] Anaphylaxis    "get hot; like I'm burning up inside; had to put me in shower after OHS because of that" (07/28/2012)  . Red Dye Anaphylaxis    "quit breathing" (07/28/2012)  . Shellfish Allergy Shortness Of Breath    "broke out in knots all over" (07/28/2012)  . Sulfonamide Derivatives Shortness Of Breath and Rash    "quit breathing" (07/28/2012)  . Tylenol [Acetaminophen] Shortness Of Breath and Itching  . Pravastatin Sodium Other (See Comments)    Leg pain, problems ambulating   . Statins     Muscle pain  . Imdur [Isosorbide Nitrate] Other (See Comments)    Intolerance. "Can't remember the reaction."    Current Outpatient Medications  Medication Sig Dispense Refill  . amLODipine (NORVASC) 5 MG tablet Take 1 tablet (5 mg total) by mouth daily. 90 tablet 2  . aspirin EC 81 MG tablet Take 81 mg by mouth daily.    . calcium carbonate (TUMS - DOSED IN MG ELEMENTAL CALCIUM) 500 MG chewable tablet Chew 1-2 tablets by mouth 3 (three) times daily as needed for heartburn.     . cholecalciferol (VITAMIN D) 1000 UNITS tablet Take 1,000 Units by mouth daily.    . Cinnamon 500 MG capsule Take 500 mg by mouth daily.    Marland Kitchen CRANBERRY-VITAMIN C PO Take 1 tablet by mouth daily.    . Insulin Glargine (TOUJEO MAX SOLOSTAR [place]) Inject 30 Units  into the skin daily.    . Magnesium 250 MG TABS Take 250 mg by mouth daily.    . metoprolol tartrate (LOPRESSOR) 100 MG tablet TAKE 1 TABLET BY MOUTH TWICE A DAY 180 tablet 2  . nitroGLYCERIN (NITROSTAT) 0.4 MG SL tablet Place 1 tablet (0.4 mg total) under the tongue every 5 (five)  minutes as needed for chest pain (up to 3 doses). 25 tablet 4  . prasugrel (EFFIENT) 10 MG TABS tablet Take 1 tablet (10 mg total) by mouth daily. 90 tablet 3  . valsartan (DIOVAN) 160 MG tablet Take 1 tablet (160 mg total) by mouth 2 (two) times daily. 180 tablet 3  . vitamin B-12 (CYANOCOBALAMIN) 1000 MCG tablet Take 1,000 mcg by mouth daily.     No current facility-administered medications for this visit.     Social History   Socioeconomic History  . Marital status: Widowed    Spouse name: Not on file  . Number of children: 2  . Years of education: Not on file  . Highest education level: Not on file  Occupational History  . Occupation: retired  Scientific laboratory technician  . Financial resource strain: Not on file  . Food insecurity:    Worry: Not on file    Inability: Not on file  . Transportation needs:    Medical: Not on file    Non-medical: Not on file  Tobacco Use  . Smoking status: Never Smoker  . Smokeless tobacco: Never Used  Substance and Sexual Activity  . Alcohol use: No    Alcohol/week: 0.0 standard drinks  . Drug use: No  . Sexual activity: Never  Lifestyle  . Physical activity:    Days per week: Not on file    Minutes per session: Not on file  . Stress: Not on file  Relationships  . Social connections:    Talks on phone: Not on file    Gets together: Not on file    Attends religious service: Not on file    Active member of club or organization: Not on file    Attends meetings of clubs or organizations: Not on file    Relationship status: Not on file  . Intimate partner violence:    Fear of current or ex partner: Not on file    Emotionally abused: Not on file    Physically abused: Not  on file    Forced sexual activity: Not on file  Other Topics Concern  . Not on file  Social History Narrative  . Not on file   Social history is notable that she is widowed.  She has 2 children, ages 55 and 37.  There is no tobacco use.  She does not routinely exercise. She has a son who had undergone CABG surgery and reportedly has a defibrillator and an ejection fraction of 35%.  Family History  Problem Relation Age of Onset  . Coronary artery disease Father   . Colon cancer Father   . Colon polyps Father   . Heart disease Father   . Stroke Mother   . Hypertension Unknown   . Breast cancer Unknown        grandmother  . Diabetes Maternal Grandmother   . Diabetes Paternal Grandmother   . Esophageal cancer Neg Hx   . Rectal cancer Neg Hx   . Stomach cancer Neg Hx    Family history is notable in that her father has heart disease and is 60 years old.  Her mother died at age 71 with a stroke.  She has 2 sisters, ages 72 and 26.  ROS General: Negative; No fevers, chills, or night sweats HEENT: Negative; No changes in vision or hearing, sinus congestion, difficulty swallowing Pulmonary: Negative; No cough, wheezing, shortness of breath, hemoptysis Cardiovascular: See HPI:  GI: Positive for left upper quadrant pain which he describes as "  gas."  She has issues with diverticulitis intermittently. GU: Negative; No dysuria, hematuria, or difficulty voiding Musculoskeletal: Negative; no myalgias, joint pain, or weakness Hematologic: Negative; no easy bruising, bleeding Endocrine: Positive for diabetes mellitus , which is untreated.  She has refused medications Neuro: Negative; no changes in balance, headaches Skin: Negative; No rashes or skin lesions Psychiatric: Negative; No behavioral problems, depression Sleep: Negative; No snoring,  daytime sleepiness, hypersomnolence, bruxism, restless legs, hypnogognic hallucinations. Other comprehensive 14 point system review is  negative   Physical Exam BP (!) 198/96   Pulse 68   Ht 5' 3.5" (1.613 m)   Wt 180 lb 3.2 oz (81.7 kg)   BMI 31.42 kg/m    Repeat blood pressure by me was still elevated at 164/84  Wt Readings from Last 3 Encounters:  10/31/18 180 lb 3.2 oz (81.7 kg)  07/11/18 184 lb 12.8 oz (83.8 kg)  01/28/18 185 lb 9.6 oz (84.2 kg)   General: Alert, oriented, no distress.  Skin: normal turgor, no rashes, warm and dry HEENT: Normocephalic, atraumatic. Pupils equal round and reactive to light; sclera anicteric; extraocular muscles intact; Nose without nasal septal hypertrophy Mouth/Parynx benign; Mallinpatti scale 3 Neck: No JVD, no carotid bruits; normal carotid upstroke Lungs: clear to ausculatation and percussion; no wheezing or rales Chest wall: without tenderness to palpitation Heart: PMI not displaced, RRR, s1 s2 normal, 1/6 systolic murmur, no diastolic murmur, no rubs, gallops, thrills, or heaves Abdomen: soft, nontender; no hepatosplenomehaly, BS+; abdominal aorta nontender and not dilated by palpation. Back: no CVA tenderness Pulses 2+ Musculoskeletal: full range of motion, normal strength, no joint deformities Extremities: no clubbing cyanosis or edema, Homan's sign negative  Neurologic: grossly nonfocal; Cranial nerves grossly wnl Psychologic: Normal mood and affect   ECG (independently read by me): NSR ay 68; nonspecific lateral T wave abnormality.  Normal intervals.  No ectopy.  May 2019 ECG (independently read by me): Normal sinus rhythm at 66 bpm.  Lateral T wave abnormality in leads I and aVL.  Normal intervals.  October 2018 ECG (independently read by me): Normal sinus rhythm at 66 bpm.  Nondiagnostic T change laterally.  Normal intervals.  No ectopy.  April 2018 ECG (independently read by me): Normal sinus rhythm at 67 bpm.  PR interval 142 Ruth.  Poor anterior R-wave progression.  Lateral T changes in 1 and L.  December 2017 ECG (independently read by me): Normal sinus  rhythm at 70 bpm.  Nonspecific ST changes.  No ectopy.  March 2017 ECG (independently read by me):  Normal sinus rhythm at 70 bpm. Nonspecific ST changes laterally.  03/04/2015 ECG (independently read by me): Normal sinus rhythm at 65 bpm.  Mild T wave abnormality in lead 1 and aVL.  LABS:  BMP Latest Ref Rng & Units 03/02/2017 02/25/2017 12/14/2016  Glucose 65 - 99 mg/dL 166(H) 142(H) 259(H)  BUN 6 - 20 mg/dL 19 13 20   Creatinine 0.44 - 1.00 mg/dL 0.92 0.63 0.63  BUN/Creat Ratio 12 - 28 - 21 -  Sodium 135 - 145 mmol/L 141 139 138  Potassium 3.5 - 5.1 mmol/L 3.5 4.8 4.6  Chloride 101 - 111 mmol/L 108 100 102  CO2 22 - 32 mmol/L 25 22 28   Calcium 8.9 - 10.3 mg/dL 8.9 10.4(H) 9.3     Hepatic Function Latest Ref Rng & Units 12/14/2016 03/19/2015 02/12/2014  Total Protein 6.1 - 8.1 g/dL 6.6 6.9 8.0  Albumin 3.6 - 5.1 g/dL 3.9 4.1 4.1  AST 10 - 35  U/L 12 20 15   ALT 6 - 29 U/L 12 20 20   Alk Phosphatase 33 - 130 U/L 63 64 61  Total Bilirubin 0.2 - 1.2 mg/dL 0.6 0.7 1.5(H)  Bilirubin, Direct 0.0 - 0.3 mg/dL - - -    CBC Latest Ref Rng & Units 03/02/2017 02/25/2017 12/14/2016  WBC 4.0 - 10.5 K/uL 9.7 6.6 5.8  Hemoglobin 12.0 - 15.0 g/dL 13.6 16.0(H) 14.7  Hematocrit 36.0 - 46.0 % 41.8 47.1(H) 44.4  Platelets 150 - 400 K/uL 199 251 243   Lab Results  Component Value Date   MCV 90.7 03/02/2017   MCV 92 02/25/2017   MCV 92.5 12/14/2016    Lab Results  Component Value Date   TSH 1.987 03/19/2015    BNP No results found for: BNP  ProBNP No results found for: PROBNP   Lipid Panel     Component Value Date/Time   CHOL 244 (H) 03/19/2015 0812   TRIG 262 (H) 03/19/2015 0812   HDL 29 (L) 03/19/2015 0812   CHOLHDL 8.4 03/19/2015 0812   VLDL 52 (H) 03/19/2015 0812   LDLCALC 163 (H) 03/19/2015 0812   LDLDIRECT 160.9 06/21/2012 0833     RADIOLOGY: No results found.  IMPRESSION:  1. Essential hypertension   2. Coronary artery disease involving native coronary artery of native  heart without angina pectoris   3. Hyperlipidemia with target low density lipoprotein (LDL) cholesterol less than 70 mg/dL   4. Statin intolerance   5. Drug allergy, multiple   6. Obesity (BMI 30-39.9)   7. Atypical chest pain   8. Diverticular disease     ASSESSMENT AND PLAN: Ruth [name] is a 73 year old female who is 13 years status post CABG revascularization surgery 4, which was done at the [facility] in [place], [place] in 2006.Marland Kitchen  She is status post remote DES stenting to the vein graft supplying her RCA territory.   She developed recurrent anginal symptomatology leading to repeat cardiac catheterization on 03/01/2017. 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  She has felt improved with out anginal symptomatology since her intervention.  In the past she has also complained of some atypical chest pain most likely dyspeptic in etiology.  She has a history of GERD.  Presently, she feels well.  She states she is felt the best she has had in a long time but continues to have intermittent gas problems and some issues with her diverticular disease.  She admits to numerous intolerances to medications.  Her blood pressure today continues to be elevated and has stage II hypertension based on new guidelines.  After much discussion she is agreed to try to further titrate amlodipine back to 5 mg daily.  She will continue with her current dose of metoprolol tartrate 100 mg twice a day and valsartan 160 mg twice a day.  She is not taking the Cardura that had been previously prescribed.  Apparently she also had had issues with Plavix and continues to take Pasa Grell 10 mg with her aspirin 81 mg.  Again discussed with her optimal blood pressure is less than 120/80 with stage I hypertension beginning at  130/80.  She will return to the care of Dr. Abner [name].  I will see her in 6 months for cardiology reevaluation   Time spent: 25 minutes  Troy Sine, MD, [facility]  11/02/2018 12:35 PM

## 2018-10-31 NOTE — Patient Instructions (Signed)
Medication Instructions:  Increase Amlodipine 5 mg daily. If you need a refill on your cardiac medications before your next appointment, please call your pharmacy.    Follow-Up: At [facility], you and your health needs are our priority.  As part of our continuing mission to provide you with exceptional heart care, we have created designated Provider Care Teams.  These Care Teams include your primary Cardiologist (physician) and Advanced Practice Providers (APPs -  Physician Assistants and Nurse Practitioners) who all work together to provide you with the care you need, when you need it. You will need a follow up appointment in 6 months.  Please call our office 2 months in advance to schedule this appointment.  You may see Dr.[name] or one of the following Advanced Practice Providers on your designated Care Team: Almyra Deforest, [place] . Fabian Sharp, PA-C

## 2018-11-02 ENCOUNTER — Encounter: Payer: Self-pay | Admitting: Cardiovascular Disease

## 2018-11-25 ENCOUNTER — Encounter: Payer: Self-pay | Admitting: Gastroenterology

## 2018-11-28 ENCOUNTER — Encounter

## 2019-02-24 ENCOUNTER — Other Ambulatory Visit: Payer: Self-pay | Admitting: Cardiovascular Disease

## 2019-04-03 DIAGNOSIS — L209 Atopic dermatitis, unspecified: Secondary | ICD-10-CM | POA: Insufficient documentation

## 2019-05-09 ENCOUNTER — Telehealth: Payer: Self-pay | Admitting: Nurse Practitioner

## 2019-05-09 NOTE — Telephone Encounter (Signed)
Hi Glynn Octave from Dr. Silvestre Mesi office just called trying to get an appt for pt asap. Could you pls contact pt one more time? Thank you.

## 2019-05-09 NOTE — Telephone Encounter (Signed)
Called the patient. She tells me she called her PCP and will be seen this afternoon.

## 2019-05-11 DIAGNOSIS — M545 Low back pain, unspecified: Secondary | ICD-10-CM | POA: Insufficient documentation

## 2019-05-12 ENCOUNTER — Other Ambulatory Visit: Payer: Self-pay

## 2019-05-12 ENCOUNTER — Encounter

## 2019-05-12 ENCOUNTER — Encounter: Payer: Self-pay | Admitting: Nurse Practitioner

## 2019-05-12 ENCOUNTER — Ambulatory Visit (INDEPENDENT_AMBULATORY_CARE_PROVIDER_SITE_OTHER): Payer: Medicare Other | Admitting: Nurse Practitioner

## 2019-05-12 VITALS — BP 132/82 | HR 79 | Temp 97.4°F | Ht 64.0 in | Wt 185.0 lb

## 2019-05-12 DIAGNOSIS — R197 Diarrhea, unspecified: Secondary | ICD-10-CM | POA: Diagnosis not present

## 2019-05-12 DIAGNOSIS — R109 Unspecified abdominal pain: Secondary | ICD-10-CM

## 2019-05-12 MED ORDER — DICYCLOMINE HCL 10 MG PO CAPS: 10.0000 mg | ORAL_CAPSULE | Freq: Two times a day (BID) | ORAL | 0 refills | Status: DC | PRN

## 2019-05-12 NOTE — Progress Notes (Signed)
Chief Complaint:    Abdominal pain, diarrhea  IMPRESSION and PLAN:     1. yo female with chronic intermittent upper abdominal pain, bloating, and loose stool here with flare in symptoms. Stool studies by PCP are pending. She is scheduled for a CT AP on Friday. Couldn't tolerate course of Vancomycin prescribed empirically for C-diff.  -I tried to reassure patient. She looks okay, abdominal exam not concerning. Will see what stool studies and CT AP show ( I will ask our office to obtain those results from PCP).    -No additional GI workup needed right now. She is due for polyp surveillance colonoscopy but I am putting this off results of stool studies and CTAP are known and also until she feels like proceeding with the procedure. We phoned PCP's office to get an update on stool studies but were told that none had been submitted? We will call again in next day or two.  -I will ask Beth, RN to call her in a few days for a condition update.   2. Hx of colon polyps, overdue for surveillance colonoscopy. Will get her scheduled following improvement of #1.    HPI:     Patient is a 73 year old female with PMH significant for CAD/remote CABG, DM , hyperlipidemia, depression, GERD, IBS, multiple drug allergies.  She was seen here several times in 2018 for evaluation of upper abdominal pain, chest pain and belching. Following Dr. Nichola Sizer retirement patient came under care of Dr. Fuller Plan.  Several days ago [place] developed worsening of her chronic diarrhea. This was associated with LUQ pain radiating upward into left shoulder and also into left upper back. The pain is not associated with eating. Says these symptoms have come and gone for years. She hasn't had any fevers. No blood in stool. She wonders about Crohn's disease but then says symptoms are most likely from IBS .Seanne called PCP a few days ago for persistent pain and diarrhea. Prescribed Vancomycin for possible C-diff. She took about 5 days of  treatment before stopping it due to worsening left sided abdominal pain.  I don't have records from PCP but she says stool studies were ordered when symptoms didn't improve She doesn't think results are available yet. PCP has scheduled a CT AP for Friday.    Review of systems:     No chest pain, no SOB, no fevers, no urinary sx   Past Medical History:  Diagnosis Date   Anxiety    CAD (coronary artery disease)    a. CABG x 4 in 2006 in [place] (LIMA->LAD, VG->Diag, VG->OM, VG->RCA); b. DES to midbody of SVG-PDA/PLA 07/2012; c. 03/2015 Myoview: EF 65%, small defect of mild severit in basal inferolateral wall, low risk.   Complication of anesthesia    "quit breathing when I got ?Inovar" (07/28/2012)   Depression    Diverticulosis    Dyslipidemia    Intolerant of statins   Fecal incontinence    GERD (gastroesophageal reflux disease)    Hyperlipidemia    Hyperplastic colon polyp    IBS (irritable bowel syndrome)    Labile hypertension    a. 09/2016 Renal Artery duplex: nl renal arteries.   Migraines    "had them in my 74's" (07/28/2012)   Multiple allergies    Obesity    Steatohepatitis    Type II diabetes mellitus ([place])    a. no meds, diet controlled - takes cinnamon.    Patient's surgical history, family medical history,  social history, medications and allergies were all reviewed in Epic    Current Outpatient Medications  Medication Sig Dispense Refill   amLODipine (NORVASC) 5 MG tablet Take 1 tablet (5 mg total) by mouth daily. 90 tablet 2   aspirin EC 81 MG tablet Take 81 mg by mouth daily.     calcium carbonate (TUMS - DOSED IN MG ELEMENTAL CALCIUM) 500 MG chewable tablet Chew 1-2 tablets by mouth 3 (three) times daily as needed for heartburn.      cholecalciferol (VITAMIN D) 1000 UNITS tablet Take 1,000 Units by mouth daily.     Cinnamon 500 MG capsule Take 500 mg by mouth daily.     CRANBERRY-VITAMIN C PO Take 1 tablet by mouth daily.      Insulin Glargine (TOUJEO MAX SOLOSTAR [place]) Inject 30 Units into the skin daily.     Magnesium 250 MG TABS Take 250 mg by mouth daily.     metoprolol tartrate (LOPRESSOR) 100 MG tablet TAKE 1 TABLET BY MOUTH TWICE A DAY 180 tablet 2   nitroGLYCERIN (NITROSTAT) 0.4 MG SL tablet Place 1 tablet (0.4 mg total) under the tongue every 5 (five) minutes as needed for chest pain (up to 3 doses). 25 tablet 4   prasugrel (EFFIENT) 10 MG TABS tablet Take 1 tablet (10 mg total) by mouth daily. 90 tablet 3   valsartan (DIOVAN) 160 MG tablet TAKE 1 TABLET(160 MG) BY MOUTH TWICE DAILY 180 tablet 3   vitamin B-12 (CYANOCOBALAMIN) 1000 MCG tablet Take 1,000 mcg by mouth daily.     No current facility-administered medications for this visit.     Physical Exam:     BP 132/82    Pulse 79    Temp (!) 97.4 F (36.3 C)    Ht 5' 4"  (1.626 m)    Wt 185 lb (83.9 kg)    BMI 31.76 kg/m   GENERAL:  Pleasant, anxious female in NAD PSYCH: : Cooperative, normal affect EENT:  conjunctiva pink, mucous membranes moist, neck supple without masses CARDIAC:  RRR, no peripheral edema PULM: Normal respiratory effort ABDOMEN:  Nondistended, soft, mild LUQ tenderness.  No obvious masses., normal bowel sounds SKIN:  turgor, no lesions seen Musculoskeletal:  Normal muscle tone, normal strength NEURO: Alert and oriented x 3, no focal neurologic deficits  I spent 25 minutes of face-to-face time with the patient. Greater than 50% of the time was spent counseling and coordinating care. Questions answered  Tye Savoy , NP 05/12/2019, 10:49 AM

## 2019-05-12 NOTE — Patient Instructions (Signed)
If you are age 75 or older, your body mass index should be between 23-30. Your Body mass index is 31.76 kg/m. If this is out of the aforementioned range listed, please consider follow up with your Primary Care Provider.  If you are age 59 or younger, your body mass index should be between 19-25. Your Body mass index is 31.76 kg/m. If this is out of the aformentioned range listed, please consider follow up with your Primary Care Provider.   Your provider has requested that you go to the basement level for lab work before leaving today. Press "B" on the elevator. The lab is located at the first door on the left as you exit the elevator. C Diff  We have sent the following medications to your pharmacy for you to pick up at your convenience: Bentyl 10 mg   We will request a copy of your CT scan being done today.  We will call you with results.  Thank you for choosing me and [facility].   Tye Savoy, NP

## 2019-05-16 ENCOUNTER — Telehealth: Payer: Self-pay | Admitting: Nurse Practitioner

## 2019-05-16 NOTE — Telephone Encounter (Signed)
Pt reported that her CT scan was normal, negative stool test and that she has discontinued antibiotics because they were making her sick.

## 2019-05-16 NOTE — Telephone Encounter (Signed)
FYI

## 2019-05-16 NOTE — Telephone Encounter (Signed)
Ruth [name], I need to complete her office note but okay. If CT and stool test negative then okay to stop antibiotics. Unfortunately I don't have an answer for her as to what is going on. I will finish note and see what I can do . Thanks

## 2019-05-17 ENCOUNTER — Encounter: Payer: Self-pay | Admitting: Nurse Practitioner

## 2019-05-17 ENCOUNTER — Other Ambulatory Visit: Payer: Self-pay | Admitting: Cardiovascular Disease

## 2019-05-18 NOTE — Progress Notes (Signed)
Reviewed and agree with management plan.  Padraic Marinos T. Sharday Michl, MD FACG [facility]  

## 2019-06-01 NOTE — Telephone Encounter (Signed)
-----   Message from Willia Craze, NP sent at 05/20/2019 11:24 AM EDT ----- Eustaquio Maize, can you call patient in a week or so and she how she is feeling. She is overdue for colonoscopy and when feels well enough should get this scheduled. Thanks

## 2019-06-01 NOTE — Telephone Encounter (Signed)
Called the patient and spoke with her. She states she does feel better. She has soft stools at this time. Not diarrhea. She declines to schedule a screening colonoscopy. "Not comfortable doing that right now" and agrees to contact me when she is ready to schedule. Understands the risks when not screened for colon cancer.

## 2019-06-02 ENCOUNTER — Other Ambulatory Visit: Payer: Self-pay | Admitting: Cardiovascular Disease

## 2019-06-02 NOTE — Telephone Encounter (Signed)
Please convert to phone note. Thanks

## 2019-06-07 ENCOUNTER — Telehealth: Payer: Self-pay | Admitting: Cardiovascular Disease

## 2019-06-07 NOTE — Telephone Encounter (Signed)
New Message:     Pt wants to know if she should have an IN Office Visit or do a Virtual Visit, because of her Heart Condition and Diabetes?

## 2019-06-07 NOTE — Telephone Encounter (Signed)
Office appt scheduled for 10-27, pt will arrive early alone and with a mask

## 2019-06-21 ENCOUNTER — Telehealth: Payer: Self-pay | Admitting: Cardiovascular Disease

## 2019-06-21 ENCOUNTER — Inpatient Hospital Stay (HOSPITAL_COMMUNITY)
Admission: EM | Admit: 2019-06-21 | Discharge: 2019-06-24 | DRG: 247 | Disposition: A | Discharge status: Home / Self Care | Payer: Medicare Other | Attending: Internal Medicine | Admitting: Internal Medicine

## 2019-06-21 ENCOUNTER — Telehealth: Payer: Self-pay

## 2019-06-21 ENCOUNTER — Other Ambulatory Visit (HOSPITAL_COMMUNITY): Payer: Medicare Other

## 2019-06-21 ENCOUNTER — Emergency Department (HOSPITAL_COMMUNITY): Payer: Medicare Other

## 2019-06-21 ENCOUNTER — Encounter (HOSPITAL_COMMUNITY): Payer: Self-pay | Admitting: Emergency Medicine

## 2019-06-21 ENCOUNTER — Observation Stay (HOSPITAL_COMMUNITY): Payer: Medicare Other

## 2019-06-21 DIAGNOSIS — Z20828 Contact with and (suspected) exposure to other viral communicable diseases: Secondary | ICD-10-CM | POA: Diagnosis present

## 2019-06-21 DIAGNOSIS — Z8249 Family history of ischemic heart disease and other diseases of the circulatory system: Secondary | ICD-10-CM

## 2019-06-21 DIAGNOSIS — Z6831 Body mass index (BMI) 31.0-31.9, adult: Secondary | ICD-10-CM

## 2019-06-21 DIAGNOSIS — I25710 Atherosclerosis of autologous vein coronary artery bypass graft(s) with unstable angina pectoris: Secondary | ICD-10-CM | POA: Diagnosis present

## 2019-06-21 DIAGNOSIS — E785 Hyperlipidemia, unspecified: Secondary | ICD-10-CM

## 2019-06-21 DIAGNOSIS — K219 Gastro-esophageal reflux disease without esophagitis: Secondary | ICD-10-CM | POA: Diagnosis not present

## 2019-06-21 DIAGNOSIS — Z882 Allergy status to sulfonamides status: Secondary | ICD-10-CM

## 2019-06-21 DIAGNOSIS — Z79899 Other long term (current) drug therapy: Secondary | ICD-10-CM

## 2019-06-21 DIAGNOSIS — Z833 Family history of diabetes mellitus: Secondary | ICD-10-CM

## 2019-06-21 DIAGNOSIS — I214 Non-ST elevation (NSTEMI) myocardial infarction: Secondary | ICD-10-CM

## 2019-06-21 DIAGNOSIS — I25118 Atherosclerotic heart disease of native coronary artery with other forms of angina pectoris: Secondary | ICD-10-CM | POA: Diagnosis not present

## 2019-06-21 DIAGNOSIS — E782 Mixed hyperlipidemia: Secondary | ICD-10-CM | POA: Diagnosis not present

## 2019-06-21 DIAGNOSIS — Z8719 Personal history of other diseases of the digestive system: Secondary | ICD-10-CM

## 2019-06-21 DIAGNOSIS — I358 Other nonrheumatic aortic valve disorders: Secondary | ICD-10-CM | POA: Diagnosis present

## 2019-06-21 DIAGNOSIS — Z794 Long term (current) use of insulin: Secondary | ICD-10-CM

## 2019-06-21 DIAGNOSIS — E1159 Type 2 diabetes mellitus with other circulatory complications: Secondary | ICD-10-CM | POA: Diagnosis not present

## 2019-06-21 DIAGNOSIS — Z803 Family history of malignant neoplasm of breast: Secondary | ICD-10-CM

## 2019-06-21 DIAGNOSIS — Z91040 Latex allergy status: Secondary | ICD-10-CM

## 2019-06-21 DIAGNOSIS — E669 Obesity, unspecified: Secondary | ICD-10-CM | POA: Diagnosis present

## 2019-06-21 DIAGNOSIS — Z951 Presence of aortocoronary bypass graft: Secondary | ICD-10-CM

## 2019-06-21 DIAGNOSIS — Z888 Allergy status to other drugs, medicaments and biological substances status: Secondary | ICD-10-CM

## 2019-06-21 DIAGNOSIS — I25810 Atherosclerosis of coronary artery bypass graft(s) without angina pectoris: Secondary | ICD-10-CM | POA: Diagnosis not present

## 2019-06-21 DIAGNOSIS — K580 Irritable bowel syndrome with diarrhea: Secondary | ICD-10-CM

## 2019-06-21 DIAGNOSIS — I25110 Atherosclerotic heart disease of native coronary artery with unstable angina pectoris: Secondary | ICD-10-CM | POA: Diagnosis not present

## 2019-06-21 DIAGNOSIS — K589 Irritable bowel syndrome without diarrhea: Secondary | ICD-10-CM | POA: Diagnosis present

## 2019-06-21 DIAGNOSIS — R109 Unspecified abdominal pain: Secondary | ICD-10-CM | POA: Diagnosis not present

## 2019-06-21 DIAGNOSIS — Z800 Family history of malignant neoplasm of digestive organs: Secondary | ICD-10-CM

## 2019-06-21 DIAGNOSIS — T82855A Stenosis of coronary artery stent, initial encounter: Secondary | ICD-10-CM | POA: Diagnosis present

## 2019-06-21 DIAGNOSIS — Z86010 Personal history of colonic polyps: Secondary | ICD-10-CM

## 2019-06-21 DIAGNOSIS — Z90710 Acquired absence of both cervix and uterus: Secondary | ICD-10-CM

## 2019-06-21 DIAGNOSIS — R079 Chest pain, unspecified: Secondary | ICD-10-CM | POA: Diagnosis not present

## 2019-06-21 DIAGNOSIS — K7581 Nonalcoholic steatohepatitis (NASH): Secondary | ICD-10-CM | POA: Diagnosis present

## 2019-06-21 DIAGNOSIS — E1165 Type 2 diabetes mellitus with hyperglycemia: Secondary | ICD-10-CM | POA: Diagnosis present

## 2019-06-21 DIAGNOSIS — E119 Type 2 diabetes mellitus without complications: Secondary | ICD-10-CM | POA: Diagnosis not present

## 2019-06-21 DIAGNOSIS — Z8371 Family history of colonic polyps: Secondary | ICD-10-CM

## 2019-06-21 DIAGNOSIS — Z885 Allergy status to narcotic agent status: Secondary | ICD-10-CM

## 2019-06-21 DIAGNOSIS — Z91041 Radiographic dye allergy status: Secondary | ICD-10-CM

## 2019-06-21 DIAGNOSIS — Z823 Family history of stroke: Secondary | ICD-10-CM

## 2019-06-21 DIAGNOSIS — I200 Unstable angina: Secondary | ICD-10-CM

## 2019-06-21 DIAGNOSIS — I10 Essential (primary) hypertension: Secondary | ICD-10-CM | POA: Diagnosis not present

## 2019-06-21 DIAGNOSIS — Z7982 Long term (current) use of aspirin: Secondary | ICD-10-CM

## 2019-06-21 DIAGNOSIS — Y712 Prosthetic and other implants, materials and accessory cardiovascular devices associated with adverse incidents: Secondary | ICD-10-CM | POA: Diagnosis present

## 2019-06-21 DIAGNOSIS — I119 Hypertensive heart disease without heart failure: Secondary | ICD-10-CM | POA: Diagnosis present

## 2019-06-21 DIAGNOSIS — Z955 Presence of coronary angioplasty implant and graft: Secondary | ICD-10-CM

## 2019-06-21 DIAGNOSIS — F419 Anxiety disorder, unspecified: Secondary | ICD-10-CM | POA: Diagnosis present

## 2019-06-21 DIAGNOSIS — I25708 Atherosclerosis of coronary artery bypass graft(s), unspecified, with other forms of angina pectoris: Secondary | ICD-10-CM

## 2019-06-21 DIAGNOSIS — I2510 Atherosclerotic heart disease of native coronary artery without angina pectoris: Secondary | ICD-10-CM | POA: Diagnosis present

## 2019-06-21 LAB — CBG MONITORING, ED
Glucose-Capillary: 137 mg/dL — ABNORMAL HIGH (ref 70–99)
Glucose-Capillary: 190 mg/dL — ABNORMAL HIGH (ref 70–99)

## 2019-06-21 LAB — HEPATIC FUNCTION PANEL
ALT: 20 U/L (ref 0–44)
AST: 33 U/L (ref 15–41)
Albumin: 4.1 g/dL (ref 3.5–5.0)
Alkaline Phosphatase: 76 U/L (ref 38–126)
Bilirubin, Direct: 0.5 mg/dL — ABNORMAL HIGH (ref 0.0–0.2)
Indirect Bilirubin: 1.2 mg/dL — ABNORMAL HIGH (ref 0.3–0.9)
Total Bilirubin: 1.7 mg/dL — ABNORMAL HIGH (ref 0.3–1.2)
Total Protein: 7.9 g/dL (ref 6.5–8.1)

## 2019-06-21 LAB — LIPID PANEL
Cholesterol: 300 mg/dL — ABNORMAL HIGH (ref 0–200)
HDL: 39 mg/dL — ABNORMAL LOW (ref >40)
LDL Cholesterol: 224 mg/dL — ABNORMAL HIGH (ref 0–99)
Total CHOL/HDL Ratio: 7.7 RATIO
Triglycerides: 183 mg/dL — ABNORMAL HIGH (ref <150)
VLDL: 37 mg/dL (ref 0–40)

## 2019-06-21 LAB — BASIC METABOLIC PANEL
Anion gap: 15 (ref 5–15)
BUN: 8 mg/dL (ref 8–23)
CO2: 23 mmol/L (ref 22–32)
Calcium: 10.2 mg/dL (ref 8.9–10.3)
Chloride: 99 mmol/L (ref 98–111)
Creatinine, Ser: 0.69 mg/dL (ref 0.44–1.00)
GFR calc Af Amer: >60 mL/min (ref >60)
GFR calc non Af Amer: >60 mL/min (ref >60)
Glucose, Bld: 244 mg/dL — ABNORMAL HIGH (ref 70–99)
Potassium: 4.2 mmol/L (ref 3.5–5.1)
Sodium: 137 mmol/L (ref 135–145)

## 2019-06-21 LAB — CBC
HCT: 46.4 % — ABNORMAL HIGH (ref 36.0–46.0)
Hemoglobin: 15.6 g/dL — ABNORMAL HIGH (ref 12.0–15.0)
MCH: 31.1 pg (ref 26.0–34.0)
MCHC: 33.6 g/dL (ref 30.0–36.0)
MCV: 92.6 fL (ref 80.0–100.0)
Platelets: 260 10*3/uL (ref 150–400)
RBC: 5.01 MIL/uL (ref 3.87–5.11)
RDW: 12.8 % (ref 11.5–15.5)
WBC: 7.7 10*3/uL (ref 4.0–10.5)
nRBC: 0 % (ref 0.0–0.2)

## 2019-06-21 LAB — TROPONIN I (HIGH SENSITIVITY)
Troponin I (High Sensitivity): 9 ng/L (ref <18)
Troponin I (High Sensitivity): 26 ng/L — ABNORMAL HIGH (ref <18)
Troponin I (High Sensitivity): 39 ng/L — ABNORMAL HIGH (ref <18)
Troponin I (High Sensitivity): 53 ng/L — ABNORMAL HIGH (ref <18)

## 2019-06-21 MED ORDER — ASPIRIN EC 81 MG PO TBEC
81.0000 mg | DELAYED_RELEASE_TABLET | Freq: Every day | ORAL | Status: DC
  Administered 2019-06-22 – 2019-06-24 (×3): 81 mg via ORAL
  Filled 2019-06-21 (×4): qty 1

## 2019-06-21 MED ORDER — MORPHINE SULFATE (PF) 2 MG/ML IV SOLN
2.0000 mg | INTRAVENOUS | Status: DC | PRN
  Administered 2019-06-22: 2 mg via INTRAVENOUS
  Filled 2019-06-21: qty 1

## 2019-06-21 MED ORDER — CALCIUM CARBONATE ANTACID 500 MG PO CHEW
1.0000 | CHEWABLE_TABLET | Freq: Three times a day (TID) | ORAL | Status: DC | PRN
  Administered 2019-06-22 – 2019-06-23 (×3): 400 mg via ORAL
  Administered 2019-06-23: 1000 mg via ORAL
  Filled 2019-06-21 (×4): qty 2

## 2019-06-21 MED ORDER — PRASUGREL HCL 10 MG PO TABS
10.0000 mg | ORAL_TABLET | Freq: Every day | ORAL | Status: DC
  Filled 2019-06-21 (×3): qty 1

## 2019-06-21 MED ORDER — INSULIN GLARGINE (2 UNIT DIAL) 300 UNIT/ML ~~LOC~~ SOPN
50.0000 [IU] | PEN_INJECTOR | Freq: Every day | SUBCUTANEOUS | Status: DC
  Administered 2019-06-21 – 2019-06-22 (×2): 50 [IU] via SUBCUTANEOUS
  Filled 2019-06-21 (×2): qty 1

## 2019-06-21 MED ORDER — SIMETHICONE 80 MG PO CHEW
80.0000 mg | CHEWABLE_TABLET | Freq: Once | ORAL | Status: DC
  Filled 2019-06-21: qty 1

## 2019-06-21 MED ORDER — NITROGLYCERIN 0.4 MG SL SUBL: 0.4000 mg | SUBLINGUAL_TABLET | SUBLINGUAL | Status: DC | PRN

## 2019-06-21 MED ORDER — IRBESARTAN 150 MG PO TABS
150.0000 mg | ORAL_TABLET | Freq: Every day | ORAL | Status: DC
  Filled 2019-06-21 (×2): qty 1

## 2019-06-21 MED ORDER — ONDANSETRON HCL 4 MG PO TABS: 4.0000 mg | ORAL_TABLET | Freq: Four times a day (QID) | ORAL | Status: DC | PRN

## 2019-06-21 MED ORDER — AMLODIPINE BESYLATE 5 MG PO TABS
5.0000 mg | ORAL_TABLET | Freq: Every day | ORAL | Status: DC
  Administered 2019-06-23 – 2019-06-24 (×2): 5 mg via ORAL
  Filled 2019-06-21 (×6): qty 1

## 2019-06-21 MED ORDER — INSULIN ASPART 100 UNIT/ML ~~LOC~~ SOLN
0.0000 [IU] | Freq: Every day | SUBCUTANEOUS | Status: DC
  Administered 2019-06-22: 22:00:00 3 [IU] via SUBCUTANEOUS

## 2019-06-21 MED ORDER — ONDANSETRON HCL 4 MG/2ML IJ SOLN: 4.0000 mg | Freq: Four times a day (QID) | INTRAMUSCULAR | Status: DC | PRN

## 2019-06-21 MED ORDER — ENOXAPARIN SODIUM 40 MG/0.4ML ~~LOC~~ SOLN
40.0000 mg | SUBCUTANEOUS | Status: DC
  Filled 2019-06-21 (×2): qty 0.4

## 2019-06-21 MED ORDER — INSULIN ASPART 100 UNIT/ML ~~LOC~~ SOLN
0.0000 [IU] | Freq: Three times a day (TID) | SUBCUTANEOUS | Status: DC
  Administered 2019-06-23: 09:00:00 3 [IU] via SUBCUTANEOUS
  Administered 2019-06-23: 18:00:00 5 [IU] via SUBCUTANEOUS
  Administered 2019-06-24 (×2): 3 [IU] via SUBCUTANEOUS

## 2019-06-21 MED ORDER — METOPROLOL TARTRATE 100 MG PO TABS
100.0000 mg | ORAL_TABLET | Freq: Two times a day (BID) | ORAL | Status: DC
  Administered 2019-06-22 – 2019-06-24 (×5): 100 mg via ORAL
  Filled 2019-06-21: qty 4
  Filled 2019-06-21 (×2): qty 2
  Filled 2019-06-21: qty 4
  Filled 2019-06-21 (×2): qty 1

## 2019-06-21 MED ORDER — SODIUM CHLORIDE 0.9% FLUSH: 3.0000 mL | Freq: Once | INTRAVENOUS | Status: DC

## 2019-06-21 NOTE — ED Triage Notes (Addendum)
C/o epigastric pain radiating to midsternal area-- crying at triage-- states has "colon problems too" pt does not want to stay-- has recently lost her dog, Has been taking GasX and tums  Recent CT scan of abd for diarrhea

## 2019-06-21 NOTE — ED Notes (Signed)
Patient transported to Ultrasound 

## 2019-06-21 NOTE — H&P (Addendum)
History and Physical    WHITTNEY [name] UXL:[id number] DOB: June 06, 1946 DOA: 06/21/2019  PCP: Crist Infante, MD  Patient coming from: Home I have personally briefly reviewed patient's old medical records in [place]  Chief Complaint: Chest pain since 3 days  HPI: Ruth [name] is a 73 y.o. female with medical history significant of coronary artery disease status post CABG 2006 and cardiac cath in 2018 with a stent to VG to RCA, hyperlipidemia, type 2 diabetes mellitus, hypertension, IBS, GERD, morbid obesity presents to emergency department due to lower chest pain/epigastric pain since 3 days.  Patient reports pain is 9 out of 10, sometimes radiates to her left arm, aggravates with eating and relieved with over-the-counter Tums and Gas-X.  She initially thought her chest pain is likely because of GERD.  She reports she had similar symptoms in the past when she had stent placement.  She denies association with nausea, vomiting, shortness of breath, palpitation, leg swelling, headache, blurry vision, diaphoresis, lightheadedness or dizziness.  She has chronic diarrhea due to history of IBS.  Denies association with blood, mucus, decreased appetite, melena, over-the-counter use of NSAIDs, weight loss or night sweats.  She denies smoking, alcohol, illicit drug use.  She is compliant with her home medicines and lives with her son.  She uses walker at nighttime.  She reports chronic cough due to allergies-denies wheezing, runny nose, sore throat, recent COVID 19 exposure.  ED Course: Her troponin trended up from 9-->26--39, EKG shows no acute changes, normal sinus rhythm.  EDP consulted cardiology-recommended transthoracic echo and n.p.o. after midnight.  Review of Systems: As per HPI otherwise negative.    Past Medical History:  Diagnosis Date  . Anxiety   . CAD (coronary artery disease)    a. CABG x 4 in 2006 in [place] (LIMA->LAD, VG->Diag, VG->OM, VG->RCA); b. DES to midbody of  SVG-PDA/PLA 07/2012; c. 03/2015 Myoview: EF 65%, small defect of mild severit in basal inferolateral wall, low risk.  . Complication of anesthesia    "quit breathing when I got ?Inovar" (07/28/2012)  . Depression   . Diverticulosis   . Dyslipidemia    Intolerant of statins  . Fecal incontinence   . GERD (gastroesophageal reflux disease)   . Hyperlipidemia   . Hyperplastic colon polyp   . IBS (irritable bowel syndrome)   . Labile hypertension    a. 09/2016 Renal Artery duplex: nl renal arteries.  . Migraines    "had them in my 30's" (07/28/2012)  . Multiple allergies   . Obesity   . Steatohepatitis   . Type II diabetes mellitus ([facility])    a. no meds, diet controlled - takes cinnamon.    Past Surgical History:  Procedure Laterality Date  . ABDOMINAL HYSTERECTOMY  1970's  . BREAST LUMPECTOMY     bilateral  . CARDIAC CATHETERIZATION  2006  . CORONARY ANGIOPLASTY WITH STENT PLACEMENT  07/28/2012   "1; first time for me" (07/28/2012)  . CORONARY ARTERY BYPASS GRAFT  2006   CABG X4  . CORONARY STENT INTERVENTION N/A 03/01/2017   Procedure: Coronary Stent Intervention;  Surgeon: Troy Sine, MD;  Location: [facility];  Service: Cardiovascular;  Laterality: N/A;  . EXCISIONAL HEMORRHOIDECTOMY  1970's  . INGUINAL HERNIA REPAIR  1970's?   left  . LEFT HEART CATH AND CORS/GRAFTS ANGIOGRAPHY N/A 03/01/2017   Procedure: Left Heart Cath and Cors/Grafts Angiography;  Surgeon: Troy Sine, MD;  Location: [facility];  Service:  Cardiovascular;  Laterality: N/A;  . LEFT HEART CATHETERIZATION WITH CORONARY/GRAFT ANGIOGRAM N/A 07/28/2012   Procedure: LEFT HEART CATHETERIZATION WITH Beatrix Fetters;  Surgeon: Burnell Blanks, MD;  Location: [facility] C[facility] LAB;  Service: Cardiovascular;  Laterality: N/A;  . PERCUTANEOUS CORONARY STENT INTERVENTION (PCI-S)  07/28/2012   Procedure: PERCUTANEOUS CORONARY STENT INTERVENTION (PCI-S);  Surgeon: Burnell Blanks, MD;   Location: [facility] CATH LAB;  Service: Cardiovascular;;  . TONSILLECTOMY AND ADENOIDECTOMY  ~ 1951     reports that she has never smoked. She has never used smokeless tobacco. She reports that she does not drink alcohol or use drugs.  Allergies  Allergen Reactions  . Betadine [Povidone Iodine] Shortness Of Breath and Swelling  . Codeine Anaphylaxis and Shortness Of Breath    "quit breathing" (07/28/2012) Tolerates 1-2 shots of morphine  . Demerol Anaphylaxis    "quit breathing" (07/28/2012)  . Iohexol Anaphylaxis    Finger/ankle swelling  . Latex Anaphylaxis    "quit breathing" (07/28/2012)  . Other Anaphylaxis    Perfume, Any Fragrance. Cleaning Fluids.  "quit breathing" (07/28/2012)  . Percocet [Oxycodone-Acetaminophen] Anaphylaxis    "quit breathing; disoriented" (07/28/2012)  . Plavix [Clopidogrel Bisulfate] Anaphylaxis    "get hot; like I'm burning up inside; had to put me in shower after OHS because of that" (07/28/2012)  . Red Dye Anaphylaxis    "quit breathing" (07/28/2012)  . Shellfish Allergy Shortness Of Breath    "broke out in knots all over" (07/28/2012)  . Sulfonamide Derivatives Shortness Of Breath and Rash    "quit breathing" (07/28/2012)  . Tylenol [Acetaminophen] Shortness Of Breath and Itching  . Pravastatin Sodium Other (See Comments)    Leg pain, problems ambulating   . Statins     Muscle pain  . Imdur [Isosorbide Nitrate] Other (See Comments)    Intolerance. "Can't remember the reaction."    Family History  Problem Relation Age of Onset  . Coronary artery disease Father   . Colon cancer Father   . Colon polyps Father   . Heart disease Father   . Stroke Mother   . Hypertension Other   . Breast cancer Other        grandmother  . Diabetes Maternal Grandmother   . Diabetes Paternal Grandmother   . Esophageal cancer Neg Hx   . Rectal cancer Neg Hx   . Stomach cancer Neg Hx     Prior to Admission medications   Medication Sig Start Date End Date  Taking? Authorizing Provider  amLODipine (NORVASC) 5 MG tablet Take 1 tablet (5 mg total) by mouth daily. Patient taking differently: Take 2.5 mg by mouth daily.  05/17/19  Yes Troy Sine, MD  aspirin EC 81 MG tablet Take 81 mg by mouth daily.   Yes [provider]  calcium carbonate (TUMS - DOSED IN MG ELEMENTAL CALCIUM) 500 MG chewable tablet Chew 1-2 tablets by mouth 3 (three) times daily as needed for heartburn.    Yes [provider]  cholecalciferol (VITAMIN D) 1000 UNITS tablet Take 1,000 Units by mouth daily.   Yes [provider]  CRANBERRY-VITAMIN C PO Take 1 tablet by mouth daily.   Yes [provider]  Insulin Glargine (TOUJEO MAX SOLOSTAR [place]) Inject 50 Units into the skin daily.    Yes [provider]  Magnesium 250 MG TABS Take 250 mg by mouth daily.   Yes [provider]  metoprolol tartrate (LOPRESSOR) 100 MG tablet TAKE 1 TABLET BY MOUTH TWICE A  DAY Patient taking differently: Take 100 mg by mouth 2 (two) times daily.  08/08/18  Yes Troy Sine, MD  nitroGLYCERIN (NITROSTAT) 0.4 MG SL tablet Place 1 tablet (0.4 mg total) under the tongue every 5 (five) minutes as needed for chest pain (up to 3 doses). 07/29/12  Yes Dunn, Dayna N, PA-C  prasugrel (EFFIENT) 10 MG TABS tablet TAKE 1 TABLET(10 MG) BY MOUTH DAILY Patient taking differently: Take 10 mg by mouth daily.  06/05/19  Yes Troy Sine, MD  Simethicone (GAS RELIEF 80 PO) Take 80 mg by mouth 3 (three) times daily as needed (gas).   Yes [provider]  valsartan (DIOVAN) 160 MG tablet TAKE 1 TABLET(160 MG) BY MOUTH TWICE DAILY Patient taking differently: Take 160 mg by mouth 2 (two) times daily.  02/24/19  Yes Troy Sine, MD  vitamin B-12 (CYANOCOBALAMIN) 1000 MCG tablet Take 1,000 mcg by mouth daily.   Yes [provider]  dicyclomine (BENTYL) 10 MG capsule Take 1 capsule (10 mg total) by mouth 2 (two) times daily as needed for spasms. Patient  not taking: Reported on 06/21/2019 05/12/19   Willia Craze, NP    Physical Exam: Vitals:   06/21/19 1430 06/21/19 1545 06/21/19 1635 06/21/19 1700  BP: (!) 170/79 (!) 191/81 (!) 167/72 (!) 165/63  Pulse: 69 75 67 67  Resp: 17 (!) 23 18 14   Temp:      TempSrc:      SpO2: 97% 97% 95% 94%    Constitutional: NAD, calm, comfortable Vitals:   06/21/19 1430 06/21/19 1545 06/21/19 1635 06/21/19 1700  BP: (!) 170/79 (!) 191/81 (!) 167/72 (!) 165/63  Pulse: 69 75 67 67  Resp: 17 (!) 23 18 14   Temp:      TempSrc:      SpO2: 97% 97% 95% 94%   Constitutional: Alert and oriented x4, not in acute distress, communicating well.   Eyes: PERRL, lids and conjunctivae normal ENMT: Mucous membranes are moist. Posterior pharynx clear of any exudate or lesions.Normal dentition.  Neck: normal, supple, no masses, no thyromegaly Respiratory: clear to auscultation bilaterally, no wheezing, no crackles. Normal respiratory effort. No accessory muscle use.  Cardiovascular: Regular rate and rhythm, no murmurs / rubs / gallops. No extremity edema. 2+ pedal pulses. No carotid bruits.  Abdomen: no tenderness, no masses palpated. No hepatosplenomegaly. Bowel sounds positive.  Musculoskeletal: no clubbing / cyanosis. No joint deformity upper and lower extremities. Good ROM, no contractures. Normal muscle tone.  Skin: no rashes, lesions, ulcers. No induration Neurologic: CN 2-12 grossly intact. Sensation intact, DTR normal. Strength 5/5 in all 4.  Psychiatric: Normal judgment and insight. Alert and oriented x 3. Normal mood.    Labs on Admission: I have personally reviewed following labs and imaging studies  CBC: Recent Labs  Lab 06/21/19 0954  WBC 7.7  HGB 15.6*  HCT 46.4*  MCV 92.6  PLT 976   Basic Metabolic Panel: Recent Labs  Lab 06/21/19 0954  NA 137  K 4.2  CL 99  CO2 23  GLUCOSE 244*  BUN 8  CREATININE 0.69  CALCIUM 10.2   GFR: CrCl cannot be calculated (Unknown ideal weight.).  Liver Function Tests: Recent Labs  Lab 06/21/19 1226  AST 33  ALT 20  ALKPHOS 76  BILITOT 1.7*  PROT 7.9  ALBUMIN 4.1   No results for input(s): LIPASE, AMYLASE in the last 168 hours. No results for input(s): AMMONIA in the last 168 hours. Coagulation Profile:  No results for input(s): INR, PROTIME in the last 168 hours. Cardiac Enzymes: No results for input(s): CKTOTAL, CKMB, CKMBINDEX, TROPONINI in the last 168 hours. BNP (last 3 results) No results for input(s): PROBNP in the last 8760 hours. HbA1C: No results for input(s): HGBA1C in the last 72 hours. CBG: Recent Labs  Lab 06/21/19 1543  GLUCAP 137*   Lipid Profile: No results for input(s): CHOL, HDL, LDLCALC, TRIG, CHOLHDL, LDLDIRECT in the last 72 hours. Thyroid Function Tests: No results for input(s): TSH, T4TOTAL, FREET4, T3FREE, THYROIDAB in the last 72 hours. Anemia Panel: No results for input(s): VITAMINB12, FOLATE, FERRITIN, TIBC, IRON, RETICCTPCT in the last 72 hours. Urine analysis:    Component Value Date/Time   COLORURINE YELLOW 09/08/2015 1928   APPEARANCEUR HAZY (A) 09/08/2015 1928   LABSPEC 1.012 09/08/2015 1928   PHURINE 5.5 09/08/2015 1928   GLUCOSEU 100 (A) 09/08/2015 1928   HGBUR NEGATIVE 09/08/2015 1928   BILIRUBINUR NEGATIVE 09/08/2015 1928   KETONESUR NEGATIVE 09/08/2015 1928   PROTEINUR NEGATIVE 09/08/2015 1928   UROBILINOGEN 0.2 02/12/2014 1945   NITRITE NEGATIVE 09/08/2015 1928   LEUKOCYTESUR MODERATE (A) 09/08/2015 1928    Radiological Exams on Admission: Dg Chest 2 View  Result Date: 06/21/2019 CLINICAL DATA:  Chest pain EXAM: CHEST - 2 VIEW COMPARISON:  July 07, 2012 FINDINGS: There is mild scarring in the bases. There is no edema or consolidation. Heart is upper normal in size with pulmonary vascularity normal. Patient is status post coronary artery bypass grafting. There is aortic atherosclerosis. No adenopathy. No bone lesions. IMPRESSION: Mild scarring in the bases. No edema  or consolidation. Stable cardiac silhouette. Postoperative changes noted. Aortic Atherosclerosis (ICD10-I70.0). Electronically Signed   By: Lowella Grip III M.D.   On: 06/21/2019 10:25    EKG: Normal sinus rhythm, no acute ST-T wave changes noted.  Assessment/Plan Principal Problem:   Chest pain Active Problems:   Coronary artery disease   Hyperlipidemia   Diabetes mellitus (HCC)   Obesity (BMI 30-39.9)   HTN (hypertension)   IBS (irritable bowel syndrome)   Hyperbilirubinemia   GERD (gastroesophageal reflux disease)   Atypical chest pain: -Lower midsternal/epigastric pain  -could be secondary to coronary artery disease versus GERD.  Symptoms improved with Tums and Gas-X. had similar presentation when she had coronary artery stenosis status post stents. -Troponin trended up from 9--> 26---> 39.  EKG: Sinus rhythm, no acute ST elevation or depression noted. -Placed patient under observation.  On telemetry.  Chest x-ray is negative for acute findings. -Continue aspirin.  Patient is allergic to nitro and statin.  Morphine PRN for pain control. -Check lipase, patient refused to take PPI.  Ordered simethicone and Tums -EDP consulted cardiology-appreciate help -Low-sodium diet for now.  N.p.o. after midnight as per cardiology recommendation. -Transthoracic echo is ordered and is pending.  Hypertension: Elevated upon arrival -We will continue home meds of amlodipine and metoprolol and valsartan -Continue to monitor blood pressure.  Coronary artery disease status post CABG in 2006 and cardiac cath in 2018: -Continue morphine PRN for pain control.  On telemetry -Continue aspirin, metoprolol, valsartan and Effient  Hyperbilirubinemia: -Total bilirubin: 1.7, direct bilirubin 0.5, indirect bilirubin: 1.2. -Transaminase: WNL. -We will order ultrasound abdomen for further evaluation.  IBS: -Patient has chronic diarrhea due to IBS. -She is allergic to Bentyl. -Cont. Tums &  simethicone. -May need GI evaluation if she continues to have epigastric pain  Hyperlipidemia: Check lipid panel -Patient is allergic to statin  Diabetes mellitus: Check A1c -  Continue Levemir 50 units daily and sliding scale insulin -Monitor blood sugar closely.  DVT prophylaxis: Lovenox/TED/SCD Code Status: Full code  family Communication: None present at bedside.  Plan of care discussed with patient in length and he verbalized understanding and agreed with it. Disposition Plan: TBD Consults called: Cardiology Dr. Irish Lack Admission status: Observation  Mckinley Jewel MD [facility] Pager [phone]  If 7PM-7AM, please contact night-coverage [email or web address] Password [facility]  06/21/2019, 6:45 PM

## 2019-06-21 NOTE — Telephone Encounter (Signed)
° ° °  Pt c/o of Chest Pain: STAT if CP now or developed within 24 hours  1. Are you having CP right now? yes  2. Are you experiencing any other symptoms (ex. SOB, nausea, vomiting, sweating)? no  3. How long have you been experiencing CP? 2 days  4. Is your CP continuous or coming and going? continuous  5. Have you taken Nitroglycerin? no ?

## 2019-06-21 NOTE — Telephone Encounter (Signed)
Agree  Peter Jordan MD, FACC   

## 2019-06-21 NOTE — ED Notes (Signed)
Patient ambulated to and from bathroom with a steady gait.

## 2019-06-21 NOTE — Consult Note (Addendum)
Cardiology Consultation:   Patient ID: Ruth [name] MRN: [id number]; DOB: 1946/04/24  Admit date: 06/21/2019 Date of Consult: 06/21/2019  Primary Care Provider: Crist Infante, MD Primary Cardiologist: [name], MD  Primary Electrophysiologist:  None    Patient Profile:   Ruth [name] is a 73 y.o. female with a hx of CAD, CABG 2006, PCI to VG of RCA DES 03/2017, HLD, DM-2, HTN, GERD and upper abd pain, with chronic diarrhea  who is being seen today for the evaluation of chest/abd pain at the request of Dr. Alvino Chapel.  History of Present Illness:   [name] with above hx and last cath 03/2017 and Stent to VG to RCA.  Patent LIMA to LAD, and may also anastomose into the diagonal vessel, SVG supplying the distal marginal branch of the circumflex vessel with smooth 60% proximal to mid body stenosis and TIMI-3 flow.  SVG supplying the distal RCA with previously noted stented segment with focal 60% stenosis followed by 95-99% thrombotic stenosis beyond the stented segment.  Done for positive nucstudy  EF normal at 55-65%.  Today pt called ofice with severe chest pain and very upset.  Also Lt arm pain.  Tried gax X without relief.    Pt crying on arrival.  Very afraid of catching covid.  Increasing her anxiety.    Pain has been over several days and seems like gas but continues despite gas X - she cannot take NTG due to headache and multiple allergies including plavix, statins.  With last stent similar presentation felt like gas.  But this is actually worse.  Her C Diff was neg. Per pt.    EKG:  The EKG was personally reviewed and demonstrates:  SR with old ant MI  Telemetry:  Telemetry was personally reviewed and demonstrates:  SR to ST Na 137, K+ 4.2, glucose 244 Cr 0.69  Total bili 1.7, direct bili 0.5 and indirect bili 1.2  Troponin HS 9 and 26  Hgb 15.6 Hct 46.4 WBC 7.7 plts 260  2V CXR Mild scarring in the bases. No edema or consolidation. Stable cardiac silhouette.  Postoperative changes noted. Aortic Atherosclerosis   VS on admit 213/80 , now 170/79 a febrile - no fevers or cold, + diarrhea with IBS and now feeling bloated.  Still with pain.   Heart Pathway Score:     Past Medical History:  Diagnosis Date  . Anxiety   . CAD (coronary artery disease)    a. CABG x 4 in 2006 in [place] (LIMA->LAD, VG->Diag, VG->OM, VG->RCA); b. DES to midbody of SVG-PDA/PLA 07/2012; c. 03/2015 Myoview: EF 65%, small defect of mild severit in basal inferolateral wall, low risk.  . Complication of anesthesia    "quit breathing when I got ?[place]" (07/28/2012)  . Depression   . Diverticulosis   . Dyslipidemia    Intolerant of statins  . Fecal incontinence   . GERD (gastroesophageal reflux disease)   . Hyperlipidemia   . Hyperplastic colon polyp   . IBS (irritable bowel syndrome)   . Labile hypertension    a. 09/2016 Renal Artery duplex: nl renal arteries.  . Migraines    "had them in my 30's" (07/28/2012)  . Multiple allergies   . Obesity   . Steatohepatitis   . Type II diabetes mellitus (HCC)    a. no meds, diet controlled - takes cinnamon.    Past Surgical History:  Procedure Laterality Date  . ABDOMINAL HYSTERECTOMY  1970's  . BREAST LUMPECTOMY  bilateral  . CARDIAC CATHETERIZATION  2006  . CORONARY ANGIOPLASTY WITH STENT PLACEMENT  07/28/2012   "1; first time for me" (07/28/2012)  . CORONARY ARTERY BYPASS GRAFT  2006   CABG X4  . CORONARY STENT INTERVENTION N/A 03/01/2017   Procedure: Coronary Stent Intervention;  Surgeon: Troy Sine, MD;  Location: [place] [facility];  Service: Cardiovascular;  Laterality: N/A;  . EXCISIONAL HEMORRHOIDECTOMY  1970's  . INGUINAL HERNIA REPAIR  1970's?   left  . LEFT HEART CATH AND CORS/GRAFTS ANGIOGRAPHY N/A 03/01/2017   Procedure: Left Heart Cath and Cors/Grafts Angiography;  Surgeon: Troy Sine, MD;  Location: [place] [facility];  Service: Cardiovascular;  Laterality: N/A;  . LEFT HEART  CATHETERIZATION WITH CORONARY/GRAFT ANGIOGRAM N/A 07/28/2012   Procedure: LEFT HEART CATHETERIZATION WITH Beatrix Fetters;  Surgeon: Burnell Blanks, MD;  Location: [facility] C[facility];  Service: Cardiovascular;  Laterality: N/A;  . PERCUTANEOUS CORONARY STENT INTERVENTION (PCI-S)  07/28/2012   Procedure: PERCUTANEOUS CORONARY STENT INTERVENTION (PCI-S);  Surgeon: Burnell Blanks, MD;  Location: [facility];  Service: Cardiovascular;;  . TONSILLECTOMY AND ADENOIDECTOMY  ~ 1951     Home Medications:  Prior to Admission medications   Medication Sig Start Date End Date Taking? Authorizing Provider  amLODipine (NORVASC) 5 MG tablet Take 1 tablet (5 mg total) by mouth daily. Patient taking differently: Take 2.5 mg by mouth daily.  05/17/19  Yes Troy Sine, MD  aspirin EC 81 MG tablet Take 81 mg by mouth daily.   Yes [provider]  calcium carbonate (TUMS - DOSED IN MG ELEMENTAL CALCIUM) 500 MG chewable tablet Chew 1-2 tablets by mouth 3 (three) times daily as needed for heartburn.    Yes [provider]  cholecalciferol (VITAMIN D) 1000 UNITS tablet Take 1,000 Units by mouth daily.   Yes [provider]  CRANBERRY-VITAMIN C PO Take 1 tablet by mouth daily.   Yes [provider]  Insulin Glargine (TOUJEO MAX SOLOSTAR [place]) Inject 50 Units into the skin daily.    Yes [provider]  Magnesium 250 MG TABS Take 250 mg by mouth daily.   Yes [provider]  metoprolol tartrate (LOPRESSOR) 100 MG tablet TAKE 1 TABLET BY MOUTH TWICE A DAY Patient taking differently: Take 100 mg by mouth 2 (two) times daily.  08/08/18  Yes Troy Sine, MD  nitroGLYCERIN (NITROSTAT) 0.4 MG SL tablet Place 1 tablet (0.4 mg total) under the tongue every 5 (five) minutes as needed for chest pain (up to 3 doses). 07/29/12  Yes Dunn, Dayna N, PA-C  prasugrel (EFFIENT) 10 MG TABS tablet TAKE 1 TABLET(10 MG) BY MOUTH DAILY Patient taking differently: Take  10 mg by mouth daily.  06/05/19  Yes Troy Sine, MD  Simethicone (GAS RELIEF 80 PO) Take 80 mg by mouth 3 (three) times daily as needed (gas).   Yes [provider]  valsartan (DIOVAN) 160 MG tablet TAKE 1 TABLET(160 MG) BY MOUTH TWICE DAILY Patient taking differently: Take 160 mg by mouth 2 (two) times daily.  02/24/19  Yes Troy Sine, MD  vitamin B-12 (CYANOCOBALAMIN) 1000 MCG tablet Take 1,000 mcg by mouth daily.   Yes [provider]  dicyclomine (BENTYL) 10 MG capsule Take 1 capsule (10 mg total) by mouth 2 (two) times daily as needed for spasms. Patient not taking: Reported on 06/21/2019 05/12/19   Willia Craze, NP    Inpatient Medications: Scheduled Meds: . sodium chloride  flush  3 mL Intravenous Once   Continuous Infusions:  PRN Meds:   Allergies:    Allergies  Allergen Reactions  . Betadine [Povidone Iodine] Shortness Of Breath and Swelling  . Codeine Anaphylaxis and Shortness Of Breath    "quit breathing" (07/28/2012) Tolerates 1-2 shots of morphine  . Demerol Anaphylaxis    "quit breathing" (07/28/2012)  . Iohexol Anaphylaxis    Finger/ankle swelling  . Latex Anaphylaxis    "quit breathing" (07/28/2012)  . Other Anaphylaxis    Perfume, Any Fragrance. Cleaning Fluids.  "quit breathing" (07/28/2012)  . Percocet [Oxycodone-Acetaminophen] Anaphylaxis    "quit breathing; disoriented" (07/28/2012)  . Plavix [Clopidogrel Bisulfate] Anaphylaxis    "get hot; like I'm burning up inside; had to put me in shower after OHS because of that" (07/28/2012)  . Red Dye Anaphylaxis    "quit breathing" (07/28/2012)  . Shellfish Allergy Shortness Of Breath    "broke out in knots all over" (07/28/2012)  . Sulfonamide Derivatives Shortness Of Breath and Rash    "quit breathing" (07/28/2012)  . Tylenol [Acetaminophen] Shortness Of Breath and Itching  . Pravastatin Sodium Other (See Comments)    Leg pain, problems ambulating   . Statins     Muscle pain   . Imdur [Isosorbide Nitrate] Other (See Comments)    Intolerance. "Can't remember the reaction."    Social History:   Social History   Socioeconomic History  . Marital status: Widowed    Spouse name: Not on file  . Number of children: 2  . Years of education: Not on file  . Highest education level: Not on file  Occupational History  . Occupation: retired  Scientific laboratory technician  . Financial resource strain: Not on file  . Food insecurity    Worry: Not on file    Inability: Not on file  . Transportation needs    Medical: Not on file    Non-medical: Not on file  Tobacco Use  . Smoking status: Never Smoker  . Smokeless tobacco: Never Used  Substance and Sexual Activity  . Alcohol use: No    Alcohol/week: 0.0 standard drinks  . Drug use: No  . Sexual activity: Never  Lifestyle  . Physical activity    Days per week: Not on file    Minutes per session: Not on file  . Stress: Not on file  Relationships  . Social Herbalist on phone: Not on file    Gets together: Not on file    Attends religious service: Not on file    Active member of club or organization: Not on file    Attends meetings of clubs or organizations: Not on file    Relationship status: Not on file  . Intimate partner violence    Fear of current or ex partner: Not on file    Emotionally abused: Not on file    Physically abused: Not on file    Forced sexual activity: Not on file  Other Topics Concern  . Not on file  Social History Narrative  . Not on file    Family History:    Family History  Problem Relation Age of Onset  . Coronary artery disease Father   . Colon cancer Father   . Colon polyps Father   . Heart disease Father   . Stroke Mother   . Hypertension Other   . Breast cancer Other        grandmother  . Diabetes Maternal Grandmother   .  Diabetes Paternal Grandmother   . Esophageal cancer Neg Hx   . Rectal cancer Neg Hx   . Stomach cancer Neg Hx      ROS:  Please see the history  of present illness.  General:no colds or fevers, no weight changes Skin:no rashes or ulcers HEENT:no blurred vision, no congestion CV:see HPI PUL:see HPI GI:no diarrhea constipation or melena, no indigestion, abd pain and hx IBS  + GERD GU:no hematuria, no dysuria MS:no joint pain, no claudication Neuro:no syncope, no lightheadedness Endo:+ diabetes, no thyroid disease  All other ROS reviewed and negative.     Physical Exam/Data:   Vitals:   06/21/19 1300 06/21/19 1315 06/21/19 1337 06/21/19 1400  BP: (!) 163/74 (!) 155/67 (!) 198/82 (!) 167/74  Pulse: 65 67 86 72  Resp: 15 15 (!) 22 15  Temp:      TempSrc:      SpO2: 94% 96% 97% 96%   No intake or output data in the 24 hours ending 06/21/19 1450 Last 3 Weights 05/12/2019 10/31/2018 07/11/2018  Weight (lbs) 185 lb 180 lb 3.2 oz 184 lb 12.8 oz  Weight (kg) 83.915 kg 81.738 kg 83.825 kg     There is no height or weight on file to calculate BMI.  General:  Well nourished, well developed, tearful, still with pain HEENT: normal Lymph: no adenopathy Neck: no JVD Endocrine:  No thryomegaly Vascular: No carotid bruits; pedal pulses 2+ bilaterally   Cardiac:  normal S1, S2; RRR; no murmur gallup rub or click  Lungs:  clear to auscultation bilaterally, no wheezing, rhonchi or rales  Abd: soft, mild tenderness + BS to hypoactive, no hepatomegaly  Ext: no edema Musculoskeletal:  No deformities, BUE and BLE strength normal and equal Skin: warm and dry  Neuro:  Alert and oriented X 3 MAE follows commands, no focal abnormalities noted Psych:  Normal affect     Relevant CV Studies:  Prox Cx to Mid Cx lesion, 100 %stenosed.  1st Mrg lesion, 100 %stenosed.  Ost LAD to Prox LAD lesion, 70 %stenosed.  Prox RCA lesion, 75 %stenosed.  Mid RCA lesion, 100 %stenosed.  SVG.  Prox Graft lesion, 60 %stenosed.  LIMA.  SVG.  Dist Graft lesion, 95 %stenosed.  A STENT SYNERGY DES 3.5X32 drug eluting stent was successfully placed.   Mid Graft lesion, 60 %stenosed.  Post intervention, there is a 0% residual stenosis.  The left ventricular systolic function is normal.  LV end diastolic pressure is normal.  The left ventricular ejection fraction is 55-65% by visual estimate.   Preserved global LV contractility with an ejection fraction of  55-65%  Significant multivessel native CAD with 70% diffuse proximal LAD stenosis, total occlusion of the circumflex and OM1 vessel proximally, and 75% proximal RCA stenosis with occlusion of the RCA prior to the acute margin.  Patent LIMA graft which supplies the mid LAD and may also anastomose into the diagonal vessel.  SVG supplying the distal marginal branch of the circumflex vessel with smooth 60% proximal to mid body stenosis and TIMI-3 flow.  SVG supplying the distal RCA with previously noted stented segment with focal 60% stenosis followed by 95-99% thrombotic stenosis beyond the stented segment.  Successful PCI to the SVG to the RCA treated with PTCA and stenting with a 3.532 mm Synergy stent postdilated to 3.71 mm with a Sport and exercise psychologist wire for distal graft protection from embolization.  RECOMMENDATION: The patient should continue dual antiplatelet therapy indefinitely.  Medical therapy will need  to be adjusted.  The patient has been resistant to take medicines in the past.  She will also be rechallenged with low dose statin therapy.  Left Ventricle The left ventricular size is normal. The left ventricular systolic function is normal. LV end diastolic pressure is normal. The left ventricular ejection fraction is 55-65% by visual estimate. No regional wall motion abnormalities. Normal global LV function without focal segmental wall motion abnormalities   Diagnostic Dominance: Right  Intervention     Laboratory Data:  High Sensitivity Troponin:   Recent Labs  Lab 06/21/19 0954 06/21/19 1226  TROPONINIHS 9 26*     Chemistry Recent Labs   Lab 06/21/19 0954  NA 137  K 4.2  CL 99  CO2 23  GLUCOSE 244*  BUN 8  CREATININE 0.69  CALCIUM 10.2  GFRNONAA >60  GFRAA >60  ANIONGAP 15    Recent Labs  Lab 06/21/19 1226  PROT 7.9  ALBUMIN 4.1  AST 33  ALT 20  ALKPHOS 76  BILITOT 1.7*   Hematology Recent Labs  Lab 06/21/19 0954  WBC 7.7  RBC 5.01  HGB 15.6*  HCT 46.4*  MCV 92.6  MCH 31.1  MCHC 33.6  RDW 12.8  PLT 260   BNPNo results for input(s): BNP, PROBNP in the last 168 hours.  DDimer No results for input(s): DDIMER in the last 168 hours.   Radiology/Studies:  Dg Chest 2 View  Result Date: 06/21/2019 CLINICAL DATA:  Chest pain EXAM: CHEST - 2 VIEW COMPARISON:  July 07, 2012 FINDINGS: There is mild scarring in the bases. There is no edema or consolidation. Heart is upper normal in size with pulmonary vascularity normal. Patient is status post coronary artery bypass grafting. There is aortic atherosclerosis. No adenopathy. No bone lesions. IMPRESSION: Mild scarring in the bases. No edema or consolidation. Stable cardiac silhouette. Postoperative changes noted. Aortic Atherosclerosis (ICD10-I70.0). Electronically Signed   By: Lowella Grip III M.D.   On: 06/21/2019 10:25    Assessment and Plan:   1. Chest pain/Abd pain with hx IBS and recent eval and diarrhea. Bentyl was added.  Now with similar pain prior to stent in 2018. Troponin low,  Will repeat pain not yet resolved.  No current associated symptoms.  Not convinced this is cardiac, would recommend keeping overnight and eval abd pain. Plan further troponins, no IV heparin unless +, and will order Echo.  Dr. Irish Lack to see.  2. CAD with hx CABG and cardiac cath 2018 with stent to VG to RCA.  Other grafts patent. 3. Recent CT abd do not have results, followed by her PCP and Dr. Fuller Plan.  4. Anxiety afraid she will catch COVID, her son lives with her with low EF and ICD, she is afraid she willbring something home with her.  5. HLD allergy to statin.   6. HTN elevated here, continue home meds. May need to increase amlodipine.  7. Multiple allergies 8. DM on insulin.      For questions or updates, please contact [place] Please consult [email or web address] for contact info under     Signed, Cecilie Kicks, NP  06/21/2019 2:50 PM  I have examined the patient and reviewed assessment and plan and discussed with patient.  Agree with above as stated.  Troponins < 100.  In 2018, she ruled in for MI before receiving her stent.  Symptoms are atypical for angina, and seem more stomach related.    Will plan to check echo. If troponins remain low and  EF is still normal, I doubt she will need ischemic eval.  Will make NPO after MN.   She reports more abdominal pain.  May need GI eval.   Larae Grooms

## 2019-06-21 NOTE — ED Provider Notes (Signed)
[place] EMERGENCY DEPARTMENT Provider Note   CSN: [id number] Arrival date & time: 06/21/19  9935     History   Chief Complaint Chief Complaint  Patient presents with  . Chest Pain  . Abdominal Pain    HPI Ruth [name] is a 73 y.o. female.     The history is provided by the patient and medical records. No language interpreter was used.  Chest Pain Associated symptoms: abdominal pain   Associated symptoms: no nausea, no palpitations, no shortness of breath and no vomiting   Abdominal Pain Associated symptoms: chest pain and diarrhea (Chronic)   Associated symptoms: no nausea, no shortness of breath and no vomiting    Ruth [name] is a 73 y.o. female  with a PMH as listed below including prior CABG with stent placement who presents to the Emergency Department by recommendation of cardiology clinic for further evaluation of her chest pain.  She reports that her pain has been ongoing for the last 2 days.  Describes it as constant, however will vary in intensity.  She is unable to give me any alleviating or aggravating factors.  She states that her pain today is central if feels similar to her cardiac pain when she had stents placed.  She does also endorse upper abdominal pain.  She is unsure if this is a related issue or not.  She does have history of IBS with similar type of pain which she is followed by GI for.  She has chronic diarrhea without any acute bowel habitus changes.  Denies any shortness of breath or diaphoresis.  No nausea or vomiting.  No fever or chills.    Past Medical History:  Diagnosis Date  . Anxiety   . CAD (coronary artery disease)    a. CABG x 4 in 2006 in [place] (LIMA->LAD, VG->Diag, VG->OM, VG->RCA); b. DES to midbody of SVG-PDA/PLA 07/2012; c. 03/2015 Myoview: EF 65%, small defect of mild severit in basal inferolateral wall, low risk.  . Complication of anesthesia    "quit breathing when I got ?[place]" (07/28/2012)  .  Depression   . Diverticulosis   . Dyslipidemia    Intolerant of statins  . Fecal incontinence   . GERD (gastroesophageal reflux disease)   . Hyperlipidemia   . Hyperplastic colon polyp   . IBS (irritable bowel syndrome)   . Labile hypertension    a. 09/2016 Renal Artery duplex: nl renal arteries.  . Migraines    "had them in my 30's" (07/28/2012)  . Multiple allergies   . Obesity   . Steatohepatitis   . Type II diabetes mellitus (HCC)    a. no meds, diet controlled - takes cinnamon.    Patient Active Problem List   Diagnosis Date Noted  . Abnormal nuclear stress test   . Hypertensive heart disease 05/25/2015  . Type II or unspecified type diabetes mellitus without mention of complication, uncontrolled 02/14/2014  . Hyponatremia 02/13/2014  . HTN (hypertension) 02/13/2014  . Sepsis secondary to UTI ([place]) 02/12/2014  . Acute pyelonephritis 02/12/2014  . Unstable angina ([place]) 07/29/2012  . Obesity (BMI 30-39.9) 07/29/2012  . Bloating 06/15/2012  . Generalized abdominal pain 06/15/2012  . Family history of colon cancer 06/15/2012  . 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 Coronary artery disease 12/30/2010  . Hyperlipidemia 12/30/2010  . Diabetes mellitus ([place]) 12/30/2010  . RECTAL INCONTINENCE 06/13/2008    Past Surgical History:  Procedure Laterality  Date  . ABDOMINAL HYSTERECTOMY  1970's  . BREAST LUMPECTOMY     bilateral  . CARDIAC CATHETERIZATION  2006  . CORONARY ANGIOPLASTY WITH STENT PLACEMENT  07/28/2012   "1; first time for me" (07/28/2012)  . CORONARY ARTERY BYPASS GRAFT  2006   CABG X4  . CORONARY STENT INTERVENTION N/A 03/01/2017   Procedure: Coronary Stent Intervention;  Surgeon: Troy Sine, MD;  Location: [place];  Service: Cardiovascular;  Laterality: N/A;  . EXCISIONAL HEMORRHOIDECTOMY  1970's  . INGUINAL HERNIA REPAIR  1970's?   left  . LEFT HEART CATH AND CORS/GRAFTS ANGIOGRAPHY N/A  03/01/2017   Procedure: Left Heart Cath and Cors/Grafts Angiography;  Surgeon: Troy Sine, MD;  Location: [place];  Service: Cardiovascular;  Laterality: N/A;  . LEFT HEART CATHETERIZATION WITH CORONARY/GRAFT ANGIOGRAM N/A 07/28/2012   Procedure: LEFT HEART CATHETERIZATION WITH Beatrix Fetters;  Surgeon: Burnell Blanks, MD;  Location: [facility] CATH LAB;  Service: Cardiovascular;  Laterality: N/A;  . PERCUTANEOUS CORONARY STENT INTERVENTION (PCI-S)  07/28/2012   Procedure: PERCUTANEOUS CORONARY STENT INTERVENTION (PCI-S);  Surgeon: Burnell Blanks, MD;  Location: [facility] CATH LAB;  Service: Cardiovascular;;  . TONSILLECTOMY AND ADENOIDECTOMY  ~ 1951     OB History   No obstetric history on file.      Home Medications    Prior to Admission medications   Medication Sig Start Date End Date Taking? Authorizing Provider  amLODipine (NORVASC) 5 MG tablet Take 1 tablet (5 mg total) by mouth daily. 10/31/18   Troy Sine, MD  amLODipine (NORVASC) 5 MG tablet Take 1 tablet (5 mg total) by mouth daily. 05/17/19   Troy Sine, MD  aspirin EC 81 MG tablet Take 81 mg by mouth daily.    [provider]  calcium carbonate (TUMS - DOSED IN MG ELEMENTAL CALCIUM) 500 MG chewable tablet Chew 1-2 tablets by mouth 3 (three) times daily as needed for heartburn.     [provider]  cholecalciferol (VITAMIN D) 1000 UNITS tablet Take 1,000 Units by mouth daily.    [provider]  CRANBERRY-VITAMIN C PO Take 1 tablet by mouth daily.    [provider]  dicyclomine (BENTYL) 10 MG capsule Take 1 capsule (10 mg total) by mouth 2 (two) times daily as needed for spasms. 05/12/19   Willia Craze, NP  Insulin Glargine (TOUJEO MAX SOLOSTAR [place]) Inject 30 Units into the skin daily.    [provider]  Magnesium 250 MG TABS Take 250 mg by mouth daily.    [provider]  metoprolol tartrate (LOPRESSOR) 100 MG tablet TAKE 1 TABLET BY  MOUTH TWICE A DAY 08/08/18   Troy Sine, MD  nitroGLYCERIN (NITROSTAT) 0.4 MG SL tablet Place 1 tablet (0.4 mg total) under the tongue every 5 (five) minutes as needed for chest pain (up to 3 doses). 07/29/12   Dunn, Nedra Hai, PA-C  prasugrel (EFFIENT) 10 MG TABS tablet TAKE 1 TABLET(10 MG) BY MOUTH DAILY 06/05/19   Troy Sine, MD  TOUJEO MAX SOLOSTAR 300 UNIT/ML [facility]  01/26/19   [provider]  valsartan (DIOVAN) 160 MG tablet TAKE 1 TABLET(160 MG) BY MOUTH TWICE DAILY 02/24/19   Troy Sine, MD  vancomycin (VANCOCIN) 125 MG capsule TK 1 C PO QID FOR 10 DAYS 05/01/19   [provider]  vitamin B-12 (CYANOCOBALAMIN) 1000 MCG tablet Take 1,000 mcg by mouth daily.    [provider]  Family History Family History  Problem Relation Age of Onset  . Coronary artery disease Father   . Colon cancer Father   . Colon polyps Father   . Heart disease Father   . Stroke Mother   . Hypertension Other   . Breast cancer Other        grandmother  . Diabetes Maternal Grandmother   . Diabetes Paternal Grandmother   . Esophageal cancer Neg Hx   . Rectal cancer Neg Hx   . Stomach cancer Neg Hx     Social History Social History   Tobacco Use  . Smoking status: Never Smoker  . Smokeless tobacco: Never Used  Substance Use Topics  . Alcohol use: No    Alcohol/week: 0.0 standard drinks  . Drug use: No     Allergies   Betadine [povidone iodine], Codeine, Demerol, Iohexol, Latex, Other, Percocet [oxycodone-acetaminophen], Plavix [clopidogrel bisulfate], Red dye, Shellfish allergy, Sulfonamide derivatives, Tylenol [acetaminophen], Pravastatin sodium, Statins, and Imdur [isosorbide nitrate]   Review of Systems Review of Systems  Respiratory: Negative for shortness of breath.   Cardiovascular: Positive for chest pain. Negative for palpitations and leg swelling.  Gastrointestinal: Positive for abdominal pain and diarrhea (Chronic). Negative for nausea and  vomiting.  All other systems reviewed and are negative.    Physical Exam Updated Vital Signs BP (!) 198/82   Pulse 86   Temp 98 F (36.7 C) (Oral)   Resp (!) 22   SpO2 97%   Physical Exam Vitals signs and nursing note reviewed.  Constitutional:      General: She is not in acute distress.    Appearance: She is well-developed.  HENT:     Head: Normocephalic and atraumatic.  Neck:     Musculoskeletal: Neck supple.  Cardiovascular:     Rate and Rhythm: Normal rate and regular rhythm.     Heart sounds: Normal heart sounds. No murmur.  Pulmonary:     Effort: Pulmonary effort is normal. No respiratory distress.     Breath sounds: Normal breath sounds.  Abdominal:     General: There is no distension.     Palpations: Abdomen is soft.     Comments: Tenderness across upper abdomen without rebound or guarding.  Negative Murphy's.  Musculoskeletal:        General: No swelling or tenderness.     Right lower leg: No edema.     Left lower leg: No edema.  Skin:    General: Skin is warm and dry.  Neurological:     Mental Status: She is alert and oriented to person, place, and time.      ED Treatments / Results  Labs (all labs ordered are listed, but only abnormal results are displayed) Labs Reviewed  BASIC METABOLIC PANEL - Abnormal; Notable for the following components:      Result Value   Glucose, Bld 244 (*)    All other components within normal limits  CBC - Abnormal; Notable for the following components:   Hemoglobin 15.6 (*)    HCT 46.4 (*)    All other components within normal limits  HEPATIC FUNCTION PANEL - Abnormal; Notable for the following components:   Total Bilirubin 1.7 (*)    Bilirubin, Direct 0.5 (*)    Indirect Bilirubin 1.2 (*)    All other components within normal limits  TROPONIN I (HIGH SENSITIVITY) - Abnormal; Notable for the following components:   Troponin I (High Sensitivity) 26 (*)    All other components within  normal limits  TROPONIN I (HIGH  SENSITIVITY)    EKG None  Radiology Dg Chest 2 View  Result Date: 06/21/2019 CLINICAL DATA:  Chest pain EXAM: CHEST - 2 VIEW COMPARISON:  July 07, 2012 FINDINGS: There is mild scarring in the bases. There is no edema or consolidation. Heart is upper normal in size with pulmonary vascularity normal. Patient is status post coronary artery bypass grafting. There is aortic atherosclerosis. No adenopathy. No bone lesions. IMPRESSION: Mild scarring in the bases. No edema or consolidation. Stable cardiac silhouette. Postoperative changes noted. Aortic Atherosclerosis (ICD10-I70.0). Electronically Signed   By: Lowella Grip III M.D.   On: 06/21/2019 10:25    Procedures Procedures (including critical care time)  Medications Ordered in ED Medications  sodium chloride flush (NS) 0.9 % injection 3 mL (has no administration in time range)     Initial Impression / Assessment and Plan / ED Course  I have reviewed the triage vital signs and the nursing notes.  Pertinent labs & imaging results that were available during my care of the patient were reviewed by me and considered in my medical decision making (see chart for details).       Ruth [name] is a 73 y.o. female who presents to ED for chest pain and upper abdominal pain.  On exam, patient is afebrile, hemodynamically stable.  Some tenderness across upper abdomen.  Nonsurgical abdominal exam.  History of chronic upper abdominal pain followed by GI closely.  Labs reviewed with normal LFTs.  First troponin normal at 9.  Repeat troponin was elevated at 26 with delta of 17.  Consulted cardiology who will evaluate.  Discussed case with oncoming provider.  We will follow-up on cardiology recommendations.  Patient discussed with Dr. Alvino Chapel who agrees with treatment plan.    Final Clinical Impressions(s) / ED Diagnoses   Final diagnoses:  None    ED Discharge Orders    None       Nikitia Asbill, Ozella Almond, PA-C 06/21/19 1534     Davonna Belling, MD 06/21/19 1554

## 2019-06-21 NOTE — Telephone Encounter (Signed)
Pt called office with severe chest pains and hysterical. She states the pain does not subside and that her left arm is hurting but she thinks it is muscle related. She says it is gas pains but she has taken Gas-X and nothing has made the pain stop. Asked the pt if she has taken any nitro, she denied and said it gives her a major headache. Given her hx of CABG and stents, the pt was educated to go to the ER. The pt did not want to go d/t having an immunocompromised son at home and did not think it was safe. Pt asked to come in office but was advised that she should go to the ER with her hx because she would more than likely just be sent there from the office. Educated the pt that she will be automatically tested for Covid at the hospital and that they take the safest precautions that they can with covid. Advised the pt to get a ride or call 911 and go to the ER. Verbalized understanding.

## 2019-06-21 NOTE — ED Notes (Signed)
CBG Results of 190 reported to [place], Therapist, sports.

## 2019-06-22 ENCOUNTER — Other Ambulatory Visit: Payer: Self-pay

## 2019-06-22 ENCOUNTER — Observation Stay (HOSPITAL_BASED_OUTPATIENT_CLINIC_OR_DEPARTMENT_OTHER): Payer: Medicare Other

## 2019-06-22 ENCOUNTER — Encounter (HOSPITAL_COMMUNITY)
Admission: EM | Disposition: A | Discharge status: Home / Self Care | Payer: Self-pay | Source: Home / Self Care | Attending: Internal Medicine

## 2019-06-22 DIAGNOSIS — K580 Irritable bowel syndrome with diarrhea: Secondary | ICD-10-CM | POA: Diagnosis present

## 2019-06-22 DIAGNOSIS — I2510 Atherosclerotic heart disease of native coronary artery without angina pectoris: Secondary | ICD-10-CM | POA: Diagnosis not present

## 2019-06-22 DIAGNOSIS — I358 Other nonrheumatic aortic valve disorders: Secondary | ICD-10-CM | POA: Diagnosis present

## 2019-06-22 DIAGNOSIS — Z86010 Personal history of colonic polyps: Secondary | ICD-10-CM | POA: Diagnosis not present

## 2019-06-22 DIAGNOSIS — T82855A Stenosis of coronary artery stent, initial encounter: Secondary | ICD-10-CM | POA: Diagnosis present

## 2019-06-22 DIAGNOSIS — R079 Chest pain, unspecified: Secondary | ICD-10-CM | POA: Diagnosis not present

## 2019-06-22 DIAGNOSIS — I214 Non-ST elevation (NSTEMI) myocardial infarction: Secondary | ICD-10-CM | POA: Diagnosis not present

## 2019-06-22 DIAGNOSIS — I25810 Atherosclerosis of coronary artery bypass graft(s) without angina pectoris: Secondary | ICD-10-CM | POA: Diagnosis not present

## 2019-06-22 DIAGNOSIS — E669 Obesity, unspecified: Secondary | ICD-10-CM | POA: Diagnosis present

## 2019-06-22 DIAGNOSIS — I119 Hypertensive heart disease without heart failure: Secondary | ICD-10-CM | POA: Diagnosis present

## 2019-06-22 DIAGNOSIS — Z951 Presence of aortocoronary bypass graft: Secondary | ICD-10-CM | POA: Diagnosis not present

## 2019-06-22 DIAGNOSIS — K7581 Nonalcoholic steatohepatitis (NASH): Secondary | ICD-10-CM | POA: Diagnosis present

## 2019-06-22 DIAGNOSIS — Z885 Allergy status to narcotic agent status: Secondary | ICD-10-CM | POA: Diagnosis not present

## 2019-06-22 DIAGNOSIS — I25710 Atherosclerosis of autologous vein coronary artery bypass graft(s) with unstable angina pectoris: Secondary | ICD-10-CM | POA: Diagnosis present

## 2019-06-22 DIAGNOSIS — Z6831 Body mass index (BMI) 31.0-31.9, adult: Secondary | ICD-10-CM | POA: Diagnosis not present

## 2019-06-22 DIAGNOSIS — Z90710 Acquired absence of both cervix and uterus: Secondary | ICD-10-CM | POA: Diagnosis not present

## 2019-06-22 DIAGNOSIS — Z882 Allergy status to sulfonamides status: Secondary | ICD-10-CM | POA: Diagnosis not present

## 2019-06-22 DIAGNOSIS — I25708 Atherosclerosis of coronary artery bypass graft(s), unspecified, with other forms of angina pectoris: Secondary | ICD-10-CM

## 2019-06-22 DIAGNOSIS — I10 Essential (primary) hypertension: Secondary | ICD-10-CM | POA: Diagnosis not present

## 2019-06-22 DIAGNOSIS — Z91041 Radiographic dye allergy status: Secondary | ICD-10-CM | POA: Diagnosis not present

## 2019-06-22 DIAGNOSIS — Z794 Long term (current) use of insulin: Secondary | ICD-10-CM | POA: Diagnosis not present

## 2019-06-22 DIAGNOSIS — Z20828 Contact with and (suspected) exposure to other viral communicable diseases: Secondary | ICD-10-CM | POA: Diagnosis present

## 2019-06-22 DIAGNOSIS — K219 Gastro-esophageal reflux disease without esophagitis: Secondary | ICD-10-CM | POA: Diagnosis present

## 2019-06-22 DIAGNOSIS — Z888 Allergy status to other drugs, medicaments and biological substances status: Secondary | ICD-10-CM | POA: Diagnosis not present

## 2019-06-22 DIAGNOSIS — Z955 Presence of coronary angioplasty implant and graft: Secondary | ICD-10-CM | POA: Diagnosis not present

## 2019-06-22 DIAGNOSIS — R109 Unspecified abdominal pain: Secondary | ICD-10-CM | POA: Diagnosis present

## 2019-06-22 DIAGNOSIS — E785 Hyperlipidemia, unspecified: Secondary | ICD-10-CM | POA: Diagnosis not present

## 2019-06-22 DIAGNOSIS — Z91040 Latex allergy status: Secondary | ICD-10-CM | POA: Diagnosis not present

## 2019-06-22 DIAGNOSIS — Y712 Prosthetic and other implants, materials and accessory cardiovascular devices associated with adverse incidents: Secondary | ICD-10-CM | POA: Diagnosis present

## 2019-06-22 DIAGNOSIS — F419 Anxiety disorder, unspecified: Secondary | ICD-10-CM | POA: Diagnosis present

## 2019-06-22 DIAGNOSIS — E1165 Type 2 diabetes mellitus with hyperglycemia: Secondary | ICD-10-CM | POA: Diagnosis present

## 2019-06-22 DIAGNOSIS — I200 Unstable angina: Secondary | ICD-10-CM | POA: Diagnosis not present

## 2019-06-22 DIAGNOSIS — E782 Mixed hyperlipidemia: Secondary | ICD-10-CM | POA: Diagnosis present

## 2019-06-22 DIAGNOSIS — I25110 Atherosclerotic heart disease of native coronary artery with unstable angina pectoris: Secondary | ICD-10-CM | POA: Diagnosis present

## 2019-06-22 HISTORY — PX: LEFT HEART CATH AND CORS/GRAFTS ANGIOGRAPHY: CATH118250

## 2019-06-22 LAB — CBC
HCT: 43.8 % (ref 36.0–46.0)
Hemoglobin: 14.9 g/dL (ref 12.0–15.0)
MCH: 31.4 pg (ref 26.0–34.0)
MCHC: 34 g/dL (ref 30.0–36.0)
MCV: 92.2 fL (ref 80.0–100.0)
Platelets: 258 10*3/uL (ref 150–400)
RBC: 4.75 MIL/uL (ref 3.87–5.11)
RDW: 13 % (ref 11.5–15.5)
WBC: 7.9 10*3/uL (ref 4.0–10.5)
nRBC: 0 % (ref 0.0–0.2)

## 2019-06-22 LAB — SARS CORONAVIRUS 2 (TAT 6-24 HRS): SARS Coronavirus 2: NEGATIVE

## 2019-06-22 LAB — HEMOGLOBIN A1C
Hgb A1c MFr Bld: 8.2 % — ABNORMAL HIGH (ref 4.8–5.6)
Mean Plasma Glucose: 188.64 mg/dL

## 2019-06-22 LAB — TROPONIN I (HIGH SENSITIVITY): Troponin I (High Sensitivity): 117 ng/L (ref <18)

## 2019-06-22 LAB — ECHOCARDIOGRAM COMPLETE

## 2019-06-22 LAB — GLUCOSE, CAPILLARY
Glucose-Capillary: 220 mg/dL — ABNORMAL HIGH (ref 70–99)
Glucose-Capillary: 284 mg/dL — ABNORMAL HIGH (ref 70–99)

## 2019-06-22 LAB — CBG MONITORING, ED: Glucose-Capillary: 159 mg/dL — ABNORMAL HIGH (ref 70–99)

## 2019-06-22 LAB — BASIC METABOLIC PANEL
Anion gap: 14 (ref 5–15)
BUN: 10 mg/dL (ref 8–23)
CO2: 22 mmol/L (ref 22–32)
Calcium: 9.1 mg/dL (ref 8.9–10.3)
Chloride: 101 mmol/L (ref 98–111)
Creatinine, Ser: 0.72 mg/dL (ref 0.44–1.00)
GFR calc Af Amer: >60 mL/min (ref >60)
GFR calc non Af Amer: >60 mL/min (ref >60)
Glucose, Bld: 150 mg/dL — ABNORMAL HIGH (ref 70–99)
Potassium: 3.9 mmol/L (ref 3.5–5.1)
Sodium: 137 mmol/L (ref 135–145)

## 2019-06-22 LAB — MRSA PCR SCREENING: MRSA by PCR: NEGATIVE

## 2019-06-22 LAB — LIPASE, BLOOD: Lipase: 29 U/L (ref 11–51)

## 2019-06-22 SURGERY — LEFT HEART CATH AND CORS/GRAFTS ANGIOGRAPHY
Anesthesia: LOCAL

## 2019-06-22 MED ORDER — ONDANSETRON HCL 4 MG/2ML IJ SOLN: 4.0000 mg | Freq: Four times a day (QID) | INTRAMUSCULAR | Status: DC | PRN

## 2019-06-22 MED ORDER — METHYLPREDNISOLONE SODIUM SUCC 125 MG IJ SOLR
125.0000 mg | Freq: Once | INTRAMUSCULAR | Status: AC
  Administered 2019-06-22: 11:00:00 125 mg via INTRAVENOUS
  Filled 2019-06-22: qty 2

## 2019-06-22 MED ORDER — NITROGLYCERIN IN D5W 200-5 MCG/ML-% IV SOLN: 10.0000 ug/min | INTRAVENOUS | Status: DC

## 2019-06-22 MED ORDER — SODIUM CHLORIDE 0.9% FLUSH
3.0000 mL | Freq: Two times a day (BID) | INTRAVENOUS | Status: DC
  Administered 2019-06-22: 22:00:00 3 mL via INTRAVENOUS

## 2019-06-22 MED ORDER — ONDANSETRON HCL 4 MG/2ML IJ SOLN
INTRAMUSCULAR | Status: AC
  Filled 2019-06-22: qty 2

## 2019-06-22 MED ORDER — FAMOTIDINE IN NACL 20-0.9 MG/50ML-% IV SOLN
20.0000 mg | Freq: Once | INTRAVENOUS | Status: AC
  Administered 2019-06-22: 11:00:00 20 mg via INTRAVENOUS
  Filled 2019-06-22: qty 50

## 2019-06-22 MED ORDER — SODIUM CHLORIDE 0.9 % IV SOLN: INTRAVENOUS | Status: AC

## 2019-06-22 MED ORDER — SODIUM CHLORIDE 0.9% FLUSH: 3.0000 mL | INTRAVENOUS | Status: DC | PRN

## 2019-06-22 MED ORDER — HYDRALAZINE HCL 20 MG/ML IJ SOLN: 10.0000 mg | INTRAMUSCULAR | Status: AC | PRN

## 2019-06-22 MED ORDER — NITROGLYCERIN IN D5W 200-5 MCG/ML-% IV SOLN
INTRAVENOUS | Status: DC | PRN
  Administered 2019-06-22: 20 ug/min via INTRAVENOUS

## 2019-06-22 MED ORDER — CHLORHEXIDINE GLUCONATE CLOTH 2 % EX PADS
6.0000 | MEDICATED_PAD | Freq: Every day | CUTANEOUS | Status: DC
  Administered 2019-06-22: 6 via TOPICAL

## 2019-06-22 MED ORDER — SODIUM CHLORIDE 0.9 % IV SOLN
INTRAVENOUS | Status: AC | PRN
  Administered 2019-06-22: 250 mL via INTRAVENOUS

## 2019-06-22 MED ORDER — SODIUM CHLORIDE 0.9% FLUSH: 3.0000 mL | Freq: Two times a day (BID) | INTRAVENOUS | Status: DC

## 2019-06-22 MED ORDER — NITROGLYCERIN IN D5W 200-5 MCG/ML-% IV SOLN
INTRAVENOUS | Status: AC
  Filled 2019-06-22: qty 250

## 2019-06-22 MED ORDER — SODIUM CHLORIDE 0.9 % IV SOLN: 250.0000 mL | INTRAVENOUS | Status: DC | PRN

## 2019-06-22 MED ORDER — LABETALOL HCL 5 MG/ML IV SOLN: 10.0000 mg | INTRAVENOUS | Status: AC | PRN

## 2019-06-22 MED ORDER — METHYLPREDNISOLONE SODIUM SUCC 125 MG IJ SOLR: 40.0000 mg | INTRAMUSCULAR | Status: DC

## 2019-06-22 MED ORDER — DIPHENHYDRAMINE HCL 50 MG/ML IJ SOLN
25.0000 mg | Freq: Once | INTRAMUSCULAR | Status: AC
  Administered 2019-06-22: 11:00:00 25 mg via INTRAVENOUS
  Filled 2019-06-22: qty 1

## 2019-06-22 MED ORDER — LIDOCAINE HCL (PF) 1 % IJ SOLN
INTRAMUSCULAR | Status: DC | PRN
  Administered 2019-06-22: 17 mL

## 2019-06-22 MED ORDER — FENTANYL CITRATE (PF) 100 MCG/2ML IJ SOLN
INTRAMUSCULAR | Status: AC
  Filled 2019-06-22: qty 2

## 2019-06-22 MED ORDER — LIDOCAINE HCL (PF) 1 % IJ SOLN
INTRAMUSCULAR | Status: AC
  Filled 2019-06-22: qty 30

## 2019-06-22 MED ORDER — ACETAMINOPHEN 325 MG PO TABS: 650.0000 mg | ORAL_TABLET | ORAL | Status: DC | PRN

## 2019-06-22 MED ORDER — HYDRALAZINE HCL 20 MG/ML IJ SOLN
INTRAMUSCULAR | Status: DC | PRN
  Administered 2019-06-22 (×2): 10 mg via INTRAVENOUS

## 2019-06-22 MED ORDER — MIDAZOLAM HCL 2 MG/2ML IJ SOLN
INTRAMUSCULAR | Status: DC | PRN
  Administered 2019-06-22: 1 mg via INTRAVENOUS

## 2019-06-22 MED ORDER — FAMOTIDINE IN NACL 20-0.9 MG/50ML-% IV SOLN
20.0000 mg | Freq: Two times a day (BID) | INTRAVENOUS | Status: DC
  Administered 2019-06-22 – 2019-06-24 (×5): 20 mg via INTRAVENOUS
  Filled 2019-06-22 (×5): qty 50

## 2019-06-22 MED ORDER — ENOXAPARIN SODIUM 40 MG/0.4ML ~~LOC~~ SOLN
40.0000 mg | SUBCUTANEOUS | Status: DC
  Filled 2019-06-22: qty 0.4

## 2019-06-22 MED ORDER — MIDAZOLAM HCL 2 MG/2ML IJ SOLN
INTRAMUSCULAR | Status: AC
  Filled 2019-06-22: qty 2

## 2019-06-22 MED ORDER — SODIUM CHLORIDE 0.9 % IV SOLN: INTRAVENOUS | Status: DC

## 2019-06-22 MED ORDER — ASPIRIN 81 MG PO CHEW: 81.0000 mg | CHEWABLE_TABLET | ORAL | Status: DC

## 2019-06-22 MED ORDER — HEPARIN (PORCINE) IN NACL 1000-0.9 UT/500ML-% IV SOLN
INTRAVENOUS | Status: AC
  Filled 2019-06-22: qty 1000

## 2019-06-22 MED ORDER — HEPARIN (PORCINE) IN NACL 1000-0.9 UT/500ML-% IV SOLN
INTRAVENOUS | Status: DC | PRN
  Administered 2019-06-22 (×2): 500 mL

## 2019-06-22 MED ORDER — HYDRALAZINE HCL 20 MG/ML IJ SOLN
INTRAMUSCULAR | Status: AC
  Filled 2019-06-22: qty 1

## 2019-06-22 MED ORDER — SODIUM CHLORIDE 0.9 % IV SOLN
250.0000 mL | INTRAVENOUS | Status: DC | PRN
  Administered 2019-06-22: 250 mL via INTRAVENOUS

## 2019-06-22 MED ORDER — FENTANYL CITRATE (PF) 100 MCG/2ML IJ SOLN
INTRAMUSCULAR | Status: DC | PRN
  Administered 2019-06-22 (×3): 25 ug via INTRAVENOUS

## 2019-06-22 MED ORDER — ONDANSETRON HCL 4 MG/2ML IJ SOLN
INTRAMUSCULAR | Status: DC | PRN
  Administered 2019-06-22: 4 mg via INTRAVENOUS

## 2019-06-22 MED ORDER — IOHEXOL 350 MG/ML SOLN
INTRAVENOUS | Status: DC | PRN
  Administered 2019-06-22: 14:00:00 110 mL

## 2019-06-22 MED ORDER — FAMOTIDINE IN NACL 20-0.9 MG/50ML-% IV SOLN
INTRAVENOUS | Status: AC
  Filled 2019-06-22: qty 50

## 2019-06-22 SURGICAL SUPPLY — 10 items
CATH INFINITI 5 FR IM (CATHETERS) ×2 IMPLANT
CATH INFINITI 5FR MPB2 (CATHETERS) ×2 IMPLANT
CLOSURE MYNX CONTROL 5F (Vascular Products) ×2 IMPLANT
KIT HEART LEFT (KITS) ×2 IMPLANT
PACK CARDIAC CATHETERIZATION (CUSTOM PROCEDURE TRAY) ×2 IMPLANT
SHEATH PINNACLE 5F 10CM (SHEATH) ×2 IMPLANT
SHEATH PROBE COVER 6X72 (BAG) ×2 IMPLANT
TRANSDUCER W/STOPCOCK (MISCELLANEOUS) ×2 IMPLANT
TUBING CIL FLEX 10 FLL-RA (TUBING) ×2 IMPLANT
WIRE EMERALD 3MM-J .035X150CM (WIRE) ×2 IMPLANT

## 2019-06-22 NOTE — CV Procedure (Addendum)
   Before leaving the Cath Lab and after discussing the findings with the patient, she became emotionally distraught, developed nausea, diaphoresis, and chest pain.  We had begun IV nitroglycerin and given IV hydralazine.  IV nitro to be prophylaxis against ischemic pain and to help with blood pressure.  IV hydralazine for blood pressure control during during groin management with Mynx.  We will place the patient in stepdown unit.  Low-dose IV nitro will be started.  EKG was performed and was identical to her tracing on admission last evening.  After speaking with Dr. Claiborne Billings, I have decided to upgrade her bed stepdown in the ICU.

## 2019-06-22 NOTE — Progress Notes (Addendum)
[facility] PROGRESS NOTE  Ruth [name] JEH:[id number] DOB: 12-08-1945 DOA: 06/21/2019 PCP: Crist Infante, MD  Assessment/Plan:  Unstable angina/Atypical chest pain: improved this am.  Query secondary to coronary artery disease versus GERD.  Symptoms improved with Tums and Gas-X but she also had similar presentation when she had coronary artery stenosis status post stents. -Troponin trended up from 9--> 26---> 39> 117. No events on tele. Lipid panel with cholesterol 300, HDL 39, LDL 224 triglycerides 183.  EKG: Sinus rhythm, no acute ST elevation or depression noted.  Chest x-ray is negative for acute findings. Abdominal US negative. Lipase within limits of normal.  Evaluated by cardiology who opined unstable angina given hx and elevated LDL concerning for potentiating aggressive atherosclerosis recommending cardiac cath for today -Continue aspirin.  Patient is allergic to nitro and statin.  Morphine PRN for pain control.  Hypertension: poor control. Home meds include amlodipine and metoprolol and valsartan -continue homemeds -Continue to monitor blood pressure.  Coronary artery disease status post CABG in 2006 and cardiac cath in 2018: -see #1 -Continue morphine PRN for pain control.  On telemetry -Continue aspirin, metoprolol, valsartan and Effient  Hyperbilirubinemia:Total bilirubin: 1.7, direct bilirubin 0.5, indirect bilirubin: 1.2. Transaminase WNL. Abdominal US unremarkable. significance unclear -monitor  IBS: Patient has chronic diarrhea due to IBS. She is allergic to Bentyl. -Cont. Tums & simethicone. -May need GI evaluation if she continues to have epigastric pain  Hyperlipidemia: lipid panel as noted above -Patient is allergic to statin  Diabetes mellitus: A1c 8.2 -Continue Levemir 50 units daily and sliding scale insulin -Monitor blood sugar closely.  Code Status: full Family Communication: patient Disposition Plan: home   Consultants:  Claiborne Billings  cards  Procedures:  Cath 10/15  Antibiotics:    HPI/Subjective: Awake alert reports pain is "better" but not gone. Anxious somewhat teary.   Objective: Vitals:   06/22/19 1245 06/22/19 1302  BP:    Pulse: 65   Resp: 15   Temp:    SpO2: 93% 96%    Intake/Output Summary (Last 24 hours) at 06/22/2019 1336 Last data filed at 06/22/2019 1146 Gross per 24 hour  Intake 100 ml  Output -  Net 100 ml   There were no vitals filed for this visit.  Exam:   General:  Obese alert no acute distress  Cardiovascular: rrr no mgr no LE edema  Respiratory: normal effort BS clear bilaterally   Abdomen: obese soft +BS no guarding or rebounding  Musculoskeletal: joints without swelling/erythema   Data Reviewed: Basic Metabolic Panel: Recent Labs  Lab 06/21/19 0954 06/22/19 0546  NA 137 137  K 4.2 3.9  CL 99 101  CO2 23 22  GLUCOSE 244* 150*  BUN 8 10  CREATININE 0.69 0.72  CALCIUM 10.2 9.1   Liver Function Tests: Recent Labs  Lab 06/21/19 1226  AST 33  ALT 20  ALKPHOS 76  BILITOT 1.7*  PROT 7.9  ALBUMIN 4.1   Recent Labs  Lab 06/22/19 0546  LIPASE 29   No results for input(s): AMMONIA in the last 168 hours. CBC: Recent Labs  Lab 06/21/19 0954 06/22/19 0546  WBC 7.7 7.9  HGB 15.6* 14.9  HCT 46.4* 43.8  MCV 92.6 92.2  PLT 260 258   Cardiac Enzymes: No results for input(s): CKTOTAL, CKMB, CKMBINDEX, TROPONINI in the last 168 hours. BNP (last 3 results) No results for input(s): BNP in the last 8760 hours.  ProBNP (last 3 results) No results for input(s): PROBNP in the last 8760  hours.  CBG: Recent Labs  Lab 06/21/19 1543 06/21/19 2124 06/22/19 0730  GLUCAP 137* 190* 159*    Recent Results (from the past 240 hour(s))  SARS CORONAVIRUS 2 (TAT 6-24 HRS) Nasopharyngeal Nasopharyngeal Swab     Status: None   Collection Time: 06/22/19  5:05 AM   Specimen: Nasopharyngeal Swab  Result Value Ref Range Status   SARS Coronavirus 2 NEGATIVE  NEGATIVE Final    Comment: (NOTE) SARS-CoV-2 target nucleic acids are NOT DETECTED. The SARS-CoV-2 RNA is generally detectable in upper and lower respiratory specimens during the acute phase of infection. Negative results do not preclude SARS-CoV-2 infection, do not rule out co-infections with other pathogens, and should not be used as the sole basis for treatment or other patient management decisions. Negative results must be combined with clinical observations, patient history, and epidemiological information. The expected result is Negative. Fact Sheet for Patients: SugarRoll.be Fact Sheet for Healthcare Providers: https://www.woods-mathews.com/ This test is not yet approved or cleared by the [place] FDA and  has been authorized for detection and/or diagnosis of SARS-CoV-2 by FDA under an Emergency Use Authorization (EUA). This EUA will remain  in effect (meaning this test can be used) for the duration of the COVID-19 declaration under Section 56 4(b)(1) of the Act, 21 U.S.C. section 360bbb-3(b)(1), unless the authorization is terminated or revoked sooner. Performed at [facility], [place] [address] [place], [place] [address]      Studies: Dg Chest 2 View  Result Date: 06/21/2019 CLINICAL DATA:  Chest pain EXAM: CHEST - 2 VIEW COMPARISON:  July 07, 2012 FINDINGS: There is mild scarring in the bases. There is no edema or consolidation. Heart is upper normal in size with pulmonary vascularity normal. Patient is status post coronary artery bypass grafting. There is aortic atherosclerosis. No adenopathy. No bone lesions. IMPRESSION: Mild scarring in the bases. No edema or consolidation. Stable cardiac silhouette. Postoperative changes noted. Aortic Atherosclerosis (ICD10-I70.0). Electronically Signed   By: Lowella Grip III M.D.   On: 06/21/2019 10:25   US Abdomen Complete  Result Date: 06/21/2019 CLINICAL DATA:  Abdominal  pain. EXAM: ABDOMEN ULTRASOUND COMPLETE COMPARISON:  None. FINDINGS: Gallbladder: No gallstones or wall thickening visualized. No sonographic Murphy sign noted by sonographer. Common bile duct: Diameter: 4 mm Liver: Increased parenchymal echogenicity. No mass or focal lesion. Portal vein is patent on color Doppler imaging with normal direction of blood flow towards the liver. IVC: No abnormality visualized. Pancreas: Visualized portion unremarkable. Spleen: Size and appearance within normal limits. Right Kidney: Length: 11.5 cm. Echogenicity within normal limits. No mass or hydronephrosis visualized. Left Kidney: Length: 11.9 cm. Echogenicity within normal limits. No mass or hydronephrosis visualized. Abdominal aorta: No aneurysm.  Atherosclerotic irregularity. Other findings: None. IMPRESSION: 1. No acute findings.  No findings to account for abdominal pain. 2. Increased liver parenchymal echogenicity consistent with hepatic steatosis. Aortic atherosclerosis. No other abnormalities. Electronically Signed   By: Lajean Manes M.D.   On: 06/21/2019 20:41    Scheduled Meds: . [MAR Hold] amLODipine  5 mg Oral Daily  . [START ON 06/23/2019] aspirin  81 mg Oral Pre-Cath  . [MAR Hold] aspirin EC  81 mg Oral Daily  . [MAR Hold] enoxaparin (LOVENOX) injection  40 mg Subcutaneous Q24H  . [MAR Hold] insulin aspart  0-15 Units Subcutaneous TID WC  . [MAR Hold] insulin aspart  0-5 Units Subcutaneous QHS  . [MAR Hold] Insulin Glargine (2 Unit Dial)  50 Units Subcutaneous Daily  . [  MAR Hold] irbesartan  150 mg Oral Daily  . [MAR Hold] metoprolol tartrate  100 mg Oral BID  . [MAR Hold] prasugrel  10 mg Oral Daily  . [MAR Hold] simethicone  80 mg Oral Once  . [MAR Hold] sodium chloride flush  3 mL Intravenous Once  . [MAR Hold] sodium chloride flush  3 mL Intravenous Q12H   Continuous Infusions: . sodium chloride    . sodium chloride      Principal Problem:   Unstable angina (HCC) Active Problems:   HTN  (hypertension)   Chest pain   Coronary artery disease   Hyperlipidemia   Diabetes mellitus (HCC)   GERD (gastroesophageal reflux disease)   Obesity (BMI 30-39.9)   IBS (irritable bowel syndrome)   Hyperbilirubinemia    Time spent: 23 min    Daphne NP  [facility]  If 7PM-7AM, please contact night-coverage at [email or web address], password [facility] 06/22/2019, 1:36 PM  LOS: 0 days

## 2019-06-22 NOTE — Interval H&P Note (Signed)
Cath Lab Visit (complete for each Cath Lab visit)  Clinical Evaluation Leading to the Procedure:   ACS: Yes.    Non-ACS:    Anginal Classification: CCS III  Anti-ischemic medical therapy: Minimal Therapy (1 class of medications)  Non-Invasive Test Results: No non-invasive testing performed  Prior CABG: Previous CABG      History and Physical Interval Note:  06/22/2019 1:05 PM  Ruth [name]  has presented today for surgery, with the diagnosis of unstable angina.  The various methods of treatment have been discussed with the patient and family. After consideration of risks, benefits and other options for treatment, the patient has consented to  Procedure(s): LEFT HEART CATH AND CORS/GRAFTS ANGIOGRAPHY (N/A) as a surgical intervention.  The patient's history has been reviewed, patient examined, no change in status, stable for surgery.  I have reviewed the patient's chart and labs.  Questions were answered to the patient's satisfaction.     [name]

## 2019-06-22 NOTE — Progress Notes (Addendum)
Progress Note  Patient Name: Ruth [name] Date of Encounter: 06/22/2019  Primary Cardiologist: [name], MD   Subjective   Now about 2 with chest /abd discomfort -  Worse pain after meals. But other times pain can be anytime.   Inpatient Medications    Scheduled Meds:  amLODipine  5 mg Oral Daily   aspirin EC  81 mg Oral Daily   enoxaparin (LOVENOX) injection  40 mg Subcutaneous Q24H   insulin aspart  0-15 Units Subcutaneous TID WC   insulin aspart  0-5 Units Subcutaneous QHS   Insulin Glargine (2 Unit Dial)  50 Units Subcutaneous Daily   irbesartan  150 mg Oral Daily   metoprolol tartrate  100 mg Oral BID   prasugrel  10 mg Oral Daily   simethicone  80 mg Oral Once   sodium chloride flush  3 mL Intravenous Once   Continuous Infusions:  PRN Meds: calcium carbonate, morphine injection, nitroGLYCERIN, ondansetron **OR** ondansetron (ZOFRAN) IV   Vital Signs    Vitals:   06/21/19 1828 06/21/19 1851 06/21/19 2105 06/22/19 0255  BP: (!) 187/84 (!) 187/84 (!) 191/80 (!) 169/91  Pulse: 88  74 68  Resp: 16  (!) 24 18  Temp:   98.2 F (36.8 C)   TempSrc:   Oral   SpO2: 97%  96% 98%   No intake or output data in the 24 hours ending 06/22/19 0908 Last 3 Weights 05/12/2019 10/31/2018 07/11/2018  Weight (lbs) 185 lb 180 lb 3.2 oz 184 lb 12.8 oz  Weight (kg) 83.915 kg 81.738 kg 83.825 kg      Telemetry    SR - Personally Reviewed  ECG    No new - Personally Reviewed  Physical Exam   GEN: No acute distress.  Though anxiety increases easily Neck: No JVD Cardiac: RRR, no murmurs, rubs, or gallops.  Respiratory: Clear to auscultation bilaterally. GI: Soft, mild tenderness, non-distended  MS: No edema; No deformity. Neuro:  Nonfocal  Psych: Normal affect   Labs    High Sensitivity Troponin:   Recent Labs  Lab 06/21/19 0954 06/21/19 1226 06/21/19 1551 06/21/19 1855 06/22/19 0546  TROPONINIHS 9 26* 39* 53* 117*      Chemistry Recent  Labs  Lab 06/21/19 0954 06/21/19 1226 06/22/19 0546  NA 137  --  137  K 4.2  --  3.9  CL 99  --  101  CO2 23  --  22  GLUCOSE 244*  --  150*  BUN 8  --  10  CREATININE 0.69  --  0.72  CALCIUM 10.2  --  9.1  PROT  --  7.9  --   ALBUMIN  --  4.1  --   AST  --  33  --   ALT  --  20  --   ALKPHOS  --  76  --   BILITOT  --  1.7*  --   GFRNONAA >60  --  >60  GFRAA >60  --  >60  ANIONGAP 15  --  14     Hematology Recent Labs  Lab 06/21/19 0954 06/22/19 0546  WBC 7.7 7.9  RBC 5.01 4.75  HGB 15.6* 14.9  HCT 46.4* 43.8  MCV 92.6 92.2  MCH 31.1 31.4  MCHC 33.6 34.0  RDW 12.8 13.0  PLT 260 258    BNPNo results for input(s): BNP, PROBNP in the last 168 hours.   DDimer No results for input(s): DDIMER in the last 168  hours.   Radiology    Dg Chest 2 View  Result Date: 06/21/2019 CLINICAL DATA:  Chest pain EXAM: CHEST - 2 VIEW COMPARISON:  July 07, 2012 FINDINGS: There is mild scarring in the bases. There is no edema or consolidation. Heart is upper normal in size with pulmonary vascularity normal. Patient is status post coronary artery bypass grafting. There is aortic atherosclerosis. No adenopathy. No bone lesions. IMPRESSION: Mild scarring in the bases. No edema or consolidation. Stable cardiac silhouette. Postoperative changes noted. Aortic Atherosclerosis (ICD10-I70.0). Electronically Signed   By: Lowella Grip III M.D.   On: 06/21/2019 10:25   US Abdomen Complete  Result Date: 06/21/2019 CLINICAL DATA:  Abdominal pain. EXAM: ABDOMEN ULTRASOUND COMPLETE COMPARISON:  None. FINDINGS: Gallbladder: No gallstones or wall thickening visualized. No sonographic Murphy sign noted by sonographer. Common bile duct: Diameter: 4 mm Liver: Increased parenchymal echogenicity. No mass or focal lesion. Portal vein is patent on color Doppler imaging with normal direction of blood flow towards the liver. IVC: No abnormality visualized. Pancreas: Visualized portion unremarkable. Spleen:  Size and appearance within normal limits. Right Kidney: Length: 11.5 cm. Echogenicity within normal limits. No mass or hydronephrosis visualized. Left Kidney: Length: 11.9 cm. Echogenicity within normal limits. No mass or hydronephrosis visualized. Abdominal aorta: No aneurysm.  Atherosclerotic irregularity. Other findings: None. IMPRESSION: 1. No acute findings.  No findings to account for abdominal pain. 2. Increased liver parenchymal echogenicity consistent with hepatic steatosis. Aortic atherosclerosis. No other abnormalities. Electronically Signed   By: Lajean Manes M.D.   On: 06/21/2019 20:41    Cardiac Studies    Prox Cx to Mid Cx lesion, 100 %stenosed.  1st Mrg lesion, 100 %stenosed.  Ost LAD to Prox LAD lesion, 70 %stenosed.  Prox RCA lesion, 75 %stenosed.  Mid RCA lesion, 100 %stenosed.  SVG.  Prox Graft lesion, 60 %stenosed.  LIMA.  SVG.  Dist Graft lesion, 95 %stenosed.  A STENT SYNERGY DES 3.5X32 drug eluting stent was successfully placed.  Mid Graft lesion, 60 %stenosed.  Post intervention, there is a 0% residual stenosis.  The left ventricular systolic function is normal.  LV end diastolic pressure is normal.  The left ventricular ejection fraction is 55-65% by visual estimate.  Preserved global LV contractility with an ejection fraction of 55-65%  Significant multivessel native CAD with 70% diffuse proximal LAD stenosis, total occlusion of the circumflex and OM1 vessel proximally, and 75% proximal RCA stenosis with occlusion of the RCA prior to the acute margin.  Patent LIMA graft which supplies the mid LAD and may also anastomose into the diagonal vessel.  SVG supplying the distal marginal branch of the circumflex vessel with smooth 60% proximal to mid body stenosis and TIMI-3 flow.  SVG supplying the distal RCA with previously noted stented segment with focal 60% stenosis followed by 95-99% thrombotic stenosis beyond the stented  segment.  Successful PCI to the SVG to the RCA treated with PTCA and stenting with a 3.532 mm Synergy stent postdilated to 3.71 mm with a Sport and exercise psychologist wire for distal graft protection from embolization.  RECOMMENDATION: The patient should continue dual antiplatelet therapy indefinitely. Medical therapy will need to be adjusted. The patient has been resistant to take medicines in the past. She will also be rechallenged with low dose statin therapy.  Left Ventricle The left ventricular size is normal. The left ventricular systolic function is normal. LV end diastolic pressure is normal. The left ventricular ejection fraction is 55-65% by visual estimate. No regional  wall motion abnormalities. Normal global LV function without focal segmental wall motion abnormalities   Diagnostic Dominance: Right  Intervention     Patient Profile     73 y.o. female with a hx of CAD, CABG 2006, PCI to VG of RCA DES 03/2017, HLD, DM-2, HTN, GERD and upper abd pain, with chronic diarrhea  who was admitted for chest/abd pain.   Assessment & Plan    1. Chest pain/Abd pain with hx IBS and recent eval and diarrhea. Bentyl was added.  Now with similar pain prior to stent in 2018. Troponin low,  Will repeat pain not yet resolved.  No current associated symptoms.  Not convinced this is cardiac pain. Troponins, no IV heparin unless +,  Echo pending.  Troponin pk 116-- Dr. Claiborne Billings to see to decide for further testing, pt is NPO 2. CAD with hx CABG and cardiac cath 2018 with stent to VG to RCA.  Other grafts patent. 3. Recent CT abd do not have results, followed by her PCP and Dr. Fuller Plan.  Question bowel ischemia with hx of CAD, and elevated lipids, DM  4. Anxiety afraid she will catch COVID, her son lives with her with low EF and ICD, she is afraid she willbring something home with her.  Her COVID is pending 5. HLD allergy to statin.  6. HTN elevated here, continue home meds. May need to increase  amlodipine.  7. Multiple allergies 8. DM on insulin     For questions or updates, please contact [place] Please consult [email or web address] for contact info under      Signed, Cecilie Kicks, NP  06/22/2019, 9:08 AM   Patient seen and examined. Agree with assessment and plan.  Patient is well-known to me.  I had last seen her in the office in February 2020.  Her last cardiac catheterization was in June 2018 she had experienced GI symptomatology.  She was found to have high-grade stenosis in the distal vein graft beyond the previously placed stent which was successfully stented.  At that time she states her GI symptoms significantly improved following the intervention.  She has a long history of multiple allergies and has stated she has "MCS (double chemical sensitivity)."  She is intolerant to statin therapy and when addressed numerous times was not tested and PCSK9 inhibition.  Presented this admission with predominantly GI symptomatology.  Her abdominal ultrasound did not show any acute abnormality.  Lipase is negative.  She has evidence for hepatosteatosis.  Troponins have now increased to 117.  Lipid studies are markedly elevated with a total cholesterol 300, LDL cholesterol 224.  I have recommended definitive cardiac catheterization in light of her troponin trend, similar GI symptomatology, and significantly elevated LDL cholesterol potentiating aggressive atherosclerosis.  She is aware of the risk benefits of the procedure.  We will place on the schedule for later today.   Troy Sine, MD, [facility] 06/22/2019 9:58 AM

## 2019-06-22 NOTE — ED Notes (Signed)
Patient CBG was 159.

## 2019-06-22 NOTE — ED Notes (Signed)
Patient's IV had come out earlier , patient didn't want another IV , I informed patient she would need IV access before going to her room , she requested that we wait until she woke up this am , I agreed.

## 2019-06-22 NOTE — H&P (View-Only) (Signed)
Progress Note  Patient Name: Ruth [name] Date of Encounter: 06/22/2019  Primary Cardiologist: [name], MD   Subjective   Now about 2 with chest /abd discomfort -  Worse pain after meals. But other times pain can be anytime.   Inpatient Medications    Scheduled Meds:  amLODipine  5 mg Oral Daily   aspirin EC  81 mg Oral Daily   enoxaparin (LOVENOX) injection  40 mg Subcutaneous Q24H   insulin aspart  0-15 Units Subcutaneous TID WC   insulin aspart  0-5 Units Subcutaneous QHS   Insulin Glargine (2 Unit Dial)  50 Units Subcutaneous Daily   irbesartan  150 mg Oral Daily   metoprolol tartrate  100 mg Oral BID   prasugrel  10 mg Oral Daily   simethicone  80 mg Oral Once   sodium chloride flush  3 mL Intravenous Once   Continuous Infusions:  PRN Meds: calcium carbonate, morphine injection, nitroGLYCERIN, ondansetron **OR** ondansetron (ZOFRAN) IV   Vital Signs    Vitals:   06/21/19 1828 06/21/19 1851 06/21/19 2105 06/22/19 0255  BP: (!) 187/84 (!) 187/84 (!) 191/80 (!) 169/91  Pulse: 88  74 68  Resp: 16  (!) 24 18  Temp:   98.2 F (36.8 C)   TempSrc:   Oral   SpO2: 97%  96% 98%   No intake or output data in the 24 hours ending 06/22/19 0908 Last 3 Weights 05/12/2019 10/31/2018 07/11/2018  Weight (lbs) 185 lb 180 lb 3.2 oz 184 lb 12.8 oz  Weight (kg) 83.915 kg 81.738 kg 83.825 kg      Telemetry    SR - Personally Reviewed  ECG    No new - Personally Reviewed  Physical Exam   GEN: No acute distress.  Though anxiety increases easily Neck: No JVD Cardiac: RRR, no murmurs, rubs, or gallops.  Respiratory: Clear to auscultation bilaterally. GI: Soft, mild tenderness, non-distended  MS: No edema; No deformity. Neuro:  Nonfocal  Psych: Normal affect   Labs    High Sensitivity Troponin:   Recent Labs  Lab 06/21/19 0954 06/21/19 1226 06/21/19 1551 06/21/19 1855 06/22/19 0546  TROPONINIHS 9 26* 39* 53* 117*      Chemistry Recent  Labs  Lab 06/21/19 0954 06/21/19 1226 06/22/19 0546  NA 137  --  137  K 4.2  --  3.9  CL 99  --  101  CO2 23  --  22  GLUCOSE 244*  --  150*  BUN 8  --  10  CREATININE 0.69  --  0.72  CALCIUM 10.2  --  9.1  PROT  --  7.9  --   ALBUMIN  --  4.1  --   AST  --  33  --   ALT  --  20  --   ALKPHOS  --  76  --   BILITOT  --  1.7*  --   GFRNONAA >60  --  >60  GFRAA >60  --  >60  ANIONGAP 15  --  14     Hematology Recent Labs  Lab 06/21/19 0954 06/22/19 0546  WBC 7.7 7.9  RBC 5.01 4.75  HGB 15.6* 14.9  HCT 46.4* 43.8  MCV 92.6 92.2  MCH 31.1 31.4  MCHC 33.6 34.0  RDW 12.8 13.0  PLT 260 258    BNPNo results for input(s): BNP, PROBNP in the last 168 hours.   DDimer No results for input(s): DDIMER in the last 168  hours.   Radiology    Dg Chest 2 View  Result Date: 06/21/2019 CLINICAL DATA:  Chest pain EXAM: CHEST - 2 VIEW COMPARISON:  July 07, 2012 FINDINGS: There is mild scarring in the bases. There is no edema or consolidation. Heart is upper normal in size with pulmonary vascularity normal. Patient is status post coronary artery bypass grafting. There is aortic atherosclerosis. No adenopathy. No bone lesions. IMPRESSION: Mild scarring in the bases. No edema or consolidation. Stable cardiac silhouette. Postoperative changes noted. Aortic Atherosclerosis (ICD10-I70.0). Electronically Signed   By: Lowella Grip III M.D.   On: 06/21/2019 10:25   US Abdomen Complete  Result Date: 06/21/2019 CLINICAL DATA:  Abdominal pain. EXAM: ABDOMEN ULTRASOUND COMPLETE COMPARISON:  None. FINDINGS: Gallbladder: No gallstones or wall thickening visualized. No sonographic Murphy sign noted by sonographer. Common bile duct: Diameter: 4 mm Liver: Increased parenchymal echogenicity. No mass or focal lesion. Portal vein is patent on color Doppler imaging with normal direction of blood flow towards the liver. IVC: No abnormality visualized. Pancreas: Visualized portion unremarkable. Spleen:  Size and appearance within normal limits. Right Kidney: Length: 11.5 cm. Echogenicity within normal limits. No mass or hydronephrosis visualized. Left Kidney: Length: 11.9 cm. Echogenicity within normal limits. No mass or hydronephrosis visualized. Abdominal aorta: No aneurysm.  Atherosclerotic irregularity. Other findings: None. IMPRESSION: 1. No acute findings.  No findings to account for abdominal pain. 2. Increased liver parenchymal echogenicity consistent with hepatic steatosis. Aortic atherosclerosis. No other abnormalities. Electronically Signed   By: Lajean Manes M.D.   On: 06/21/2019 20:41    Cardiac Studies    Prox Cx to Mid Cx lesion, 100 %stenosed.  1st Mrg lesion, 100 %stenosed.  Ost LAD to Prox LAD lesion, 70 %stenosed.  Prox RCA lesion, 75 %stenosed.  Mid RCA lesion, 100 %stenosed.  SVG.  Prox Graft lesion, 60 %stenosed.  LIMA.  SVG.  Dist Graft lesion, 95 %stenosed.  A STENT SYNERGY DES 3.5X32 drug eluting stent was successfully placed.  Mid Graft lesion, 60 %stenosed.  Post intervention, there is a 0% residual stenosis.  The left ventricular systolic function is normal.  LV end diastolic pressure is normal.  The left ventricular ejection fraction is 55-65% by visual estimate.  Preserved global LV contractility with an ejection fraction of 55-65%  Significant multivessel native CAD with 70% diffuse proximal LAD stenosis, total occlusion of the circumflex and OM1 vessel proximally, and 75% proximal RCA stenosis with occlusion of the RCA prior to the acute margin.  Patent LIMA graft which supplies the mid LAD and may also anastomose into the diagonal vessel.  SVG supplying the distal marginal branch of the circumflex vessel with smooth 60% proximal to mid body stenosis and TIMI-3 flow.  SVG supplying the distal RCA with previously noted stented segment with focal 60% stenosis followed by 95-99% thrombotic stenosis beyond the stented  segment.  Successful PCI to the SVG to the RCA treated with PTCA and stenting with a 3.532 mm Synergy stent postdilated to 3.71 mm with a Sport and exercise psychologist wire for distal graft protection from embolization.  RECOMMENDATION: The patient should continue dual antiplatelet therapy indefinitely. Medical therapy will need to be adjusted. The patient has been resistant to take medicines in the past. She will also be rechallenged with low dose statin therapy.  Left Ventricle The left ventricular size is normal. The left ventricular systolic function is normal. LV end diastolic pressure is normal. The left ventricular ejection fraction is 55-65% by visual estimate. No regional  wall motion abnormalities. Normal global LV function without focal segmental wall motion abnormalities   Diagnostic Dominance: Right  Intervention     Patient Profile     73 y.o. female with a hx of CAD, CABG 2006, PCI to VG of RCA DES 03/2017, HLD, DM-2, HTN, GERD and upper abd pain, with chronic diarrhea  who was admitted for chest/abd pain.   Assessment & Plan    1. Chest pain/Abd pain with hx IBS and recent eval and diarrhea. Bentyl was added.  Now with similar pain prior to stent in 2018. Troponin low,  Will repeat pain not yet resolved.  No current associated symptoms.  Not convinced this is cardiac pain. Troponins, no IV heparin unless +,  Echo pending.  Troponin pk 116-- Dr. Claiborne Billings to see to decide for further testing, pt is NPO 2. CAD with hx CABG and cardiac cath 2018 with stent to VG to RCA.  Other grafts patent. 3. Recent CT abd do not have results, followed by her PCP and Dr. Fuller Plan.  Question bowel ischemia with hx of CAD, and elevated lipids, DM  4. Anxiety afraid she will catch COVID, her son lives with her with low EF and ICD, she is afraid she willbring something home with her.  Her COVID is pending 5. HLD allergy to statin.  6. HTN elevated here, continue home meds. May need to increase  amlodipine.  7. Multiple allergies 8. DM on insulin     For questions or updates, please contact [place] Please consult [email or web address] for contact info under      Signed, Cecilie Kicks, NP  06/22/2019, 9:08 AM   Patient seen and examined. Agree with assessment and plan.  Patient is well-known to me.  I had last seen her in the office in February 2020.  Her last cardiac catheterization was in June 2018 she had experienced GI symptomatology.  She was found to have high-grade stenosis in the distal vein graft beyond the previously placed stent which was successfully stented.  At that time she states her GI symptoms significantly improved following the intervention.  She has a long history of multiple allergies and has stated she has "MCS (double chemical sensitivity)."  She is intolerant to statin therapy and when addressed numerous times was not tested and PCSK9 inhibition.  Presented this admission with predominantly GI symptomatology.  Her abdominal ultrasound did not show any acute abnormality.  Lipase is negative.  She has evidence for hepatosteatosis.  Troponins have now increased to 117.  Lipid studies are markedly elevated with a total cholesterol 300, LDL cholesterol 224.  I have recommended definitive cardiac catheterization in light of her troponin trend, similar GI symptomatology, and significantly elevated LDL cholesterol potentiating aggressive atherosclerosis.  She is aware of the risk benefits of the procedure.  We will place on the schedule for later today.   Troy Sine, MD, [facility] 06/22/2019 9:58 AM

## 2019-06-22 NOTE — Progress Notes (Signed)
  Echocardiogram 2D Echocardiogram has been performed.  Ruth [name] 06/22/2019, 10:51 AM

## 2019-06-23 ENCOUNTER — Encounter (HOSPITAL_COMMUNITY)
Admission: EM | Disposition: A | Discharge status: Home / Self Care | Payer: Self-pay | Source: Home / Self Care | Attending: Internal Medicine

## 2019-06-23 ENCOUNTER — Encounter (HOSPITAL_COMMUNITY): Payer: Self-pay | Admitting: Interventional Cardiology

## 2019-06-23 DIAGNOSIS — I25810 Atherosclerosis of coronary artery bypass graft(s) without angina pectoris: Secondary | ICD-10-CM | POA: Diagnosis not present

## 2019-06-23 DIAGNOSIS — I214 Non-ST elevation (NSTEMI) myocardial infarction: Secondary | ICD-10-CM

## 2019-06-23 DIAGNOSIS — R079 Chest pain, unspecified: Secondary | ICD-10-CM | POA: Diagnosis not present

## 2019-06-23 DIAGNOSIS — I200 Unstable angina: Secondary | ICD-10-CM | POA: Diagnosis not present

## 2019-06-23 DIAGNOSIS — E785 Hyperlipidemia, unspecified: Secondary | ICD-10-CM | POA: Diagnosis not present

## 2019-06-23 HISTORY — PX: CORONARY STENT INTERVENTION: CATH118234

## 2019-06-23 LAB — BASIC METABOLIC PANEL
Anion gap: 13 (ref 5–15)
BUN: 19 mg/dL (ref 8–23)
CO2: 22 mmol/L (ref 22–32)
Calcium: 9.4 mg/dL (ref 8.9–10.3)
Chloride: 102 mmol/L (ref 98–111)
Creatinine, Ser: 0.84 mg/dL (ref 0.44–1.00)
GFR calc Af Amer: >60 mL/min (ref >60)
GFR calc non Af Amer: >60 mL/min (ref >60)
Glucose, Bld: 229 mg/dL — ABNORMAL HIGH (ref 70–99)
Potassium: 3.9 mmol/L (ref 3.5–5.1)
Sodium: 137 mmol/L (ref 135–145)

## 2019-06-23 LAB — GLUCOSE, CAPILLARY
Glucose-Capillary: 227 mg/dL — ABNORMAL HIGH (ref 70–99)
Glucose-Capillary: 187 mg/dL — ABNORMAL HIGH (ref 70–99)
Glucose-Capillary: 161 mg/dL — ABNORMAL HIGH (ref 70–99)
Glucose-Capillary: 223 mg/dL — ABNORMAL HIGH (ref 70–99)
Glucose-Capillary: 303 mg/dL — ABNORMAL HIGH (ref 70–99)

## 2019-06-23 LAB — CBC
HCT: 44.3 % (ref 36.0–46.0)
Hemoglobin: 14.7 g/dL (ref 12.0–15.0)
MCH: 30.6 pg (ref 26.0–34.0)
MCHC: 33.2 g/dL (ref 30.0–36.0)
MCV: 92.1 fL (ref 80.0–100.0)
Platelets: 291 10*3/uL (ref 150–400)
RBC: 4.81 MIL/uL (ref 3.87–5.11)
RDW: 13.1 % (ref 11.5–15.5)
WBC: 9.5 10*3/uL (ref 4.0–10.5)
nRBC: 0 % (ref 0.0–0.2)

## 2019-06-23 LAB — POCT ACTIVATED CLOTTING TIME
Activated Clotting Time: 417 seconds
Activated Clotting Time: 158 seconds

## 2019-06-23 LAB — TROPONIN I (HIGH SENSITIVITY): Troponin I (High Sensitivity): 446 ng/L (ref <18)

## 2019-06-23 SURGERY — CORONARY STENT INTERVENTION
Anesthesia: LOCAL

## 2019-06-23 MED ORDER — SODIUM CHLORIDE 0.9 % IV SOLN
INTRAVENOUS | Status: DC | PRN
  Administered 2019-06-23: 13:00:00 1.75 mg/kg/h via INTRAVENOUS

## 2019-06-23 MED ORDER — FENTANYL CITRATE (PF) 100 MCG/2ML IJ SOLN
INTRAMUSCULAR | Status: DC | PRN
  Administered 2019-06-23 (×2): 25 ug via INTRAVENOUS

## 2019-06-23 MED ORDER — LIDOCAINE HCL (PF) 1 % IJ SOLN
INTRAMUSCULAR | Status: DC | PRN
  Administered 2019-06-23: 20 mL via INTRADERMAL

## 2019-06-23 MED ORDER — SODIUM CHLORIDE 0.9 % WEIGHT BASED INFUSION
1.0000 mL/kg/h | INTRAVENOUS | Status: DC
  Administered 2019-06-23 (×2): 1 mL/kg/h via INTRAVENOUS

## 2019-06-23 MED ORDER — SODIUM CHLORIDE 0.9 % WEIGHT BASED INFUSION: 3.0000 mL/kg/h | INTRAVENOUS | Status: DC

## 2019-06-23 MED ORDER — DIPHENHYDRAMINE HCL 25 MG PO CAPS: 50.0000 mg | ORAL_CAPSULE | Freq: Once | ORAL | Status: DC

## 2019-06-23 MED ORDER — HEPARIN (PORCINE) IN NACL 1000-0.9 UT/500ML-% IV SOLN
INTRAVENOUS | Status: AC
  Filled 2019-06-23: qty 1000

## 2019-06-23 MED ORDER — MIDAZOLAM HCL 2 MG/2ML IJ SOLN
INTRAMUSCULAR | Status: DC | PRN
  Administered 2019-06-23 (×2): 1 mg via INTRAVENOUS

## 2019-06-23 MED ORDER — NITROGLYCERIN 1 MG/10 ML FOR IR/CATH LAB
INTRA_ARTERIAL | Status: DC | PRN
  Administered 2019-06-23 (×2): 200 ug via INTRACORONARY

## 2019-06-23 MED ORDER — METHYLPREDNISOLONE SODIUM SUCC 40 MG IJ SOLR
40.0000 mg | INTRAMUSCULAR | Status: DC
  Filled 2019-06-23: qty 1

## 2019-06-23 MED ORDER — SODIUM CHLORIDE 0.9 % IV SOLN: 250.0000 mL | INTRAVENOUS | Status: DC | PRN

## 2019-06-23 MED ORDER — FENTANYL CITRATE (PF) 100 MCG/2ML IJ SOLN
INTRAMUSCULAR | Status: AC
  Filled 2019-06-23: qty 2

## 2019-06-23 MED ORDER — DIPHENHYDRAMINE HCL 50 MG/ML IJ SOLN
50.0000 mg | Freq: Once | INTRAMUSCULAR | Status: AC
  Administered 2019-06-23: 50 mg via INTRAVENOUS
  Filled 2019-06-23: qty 1

## 2019-06-23 MED ORDER — SODIUM CHLORIDE 0.9 % WEIGHT BASED INFUSION: 1.0000 mL/kg/h | INTRAVENOUS | Status: AC

## 2019-06-23 MED ORDER — PRASUGREL HCL 10 MG PO TABS
10.0000 mg | ORAL_TABLET | Freq: Every day | ORAL | Status: DC
  Administered 2019-06-24: 08:00:00 10 mg via ORAL
  Filled 2019-06-23: qty 1

## 2019-06-23 MED ORDER — METHYLPREDNISOLONE SODIUM SUCC 125 MG IJ SOLR
125.0000 mg | Freq: Once | INTRAMUSCULAR | Status: AC
  Administered 2019-06-23: 125 mg via INTRAVENOUS
  Filled 2019-06-23: qty 2

## 2019-06-23 MED ORDER — BIVALIRUDIN BOLUS VIA INFUSION - CUPID
INTRAVENOUS | Status: DC | PRN
  Administered 2019-06-23: 62.925 mg via INTRAVENOUS

## 2019-06-23 MED ORDER — DIPHENHYDRAMINE HCL 25 MG PO CAPS
50.0000 mg | ORAL_CAPSULE | Freq: Once | ORAL | Status: AC
  Filled 2019-06-23: qty 2

## 2019-06-23 MED ORDER — PRASUGREL HCL 10 MG PO TABS
ORAL_TABLET | ORAL | Status: DC | PRN
  Administered 2019-06-23: 60 mg via ORAL

## 2019-06-23 MED ORDER — ENOXAPARIN SODIUM 40 MG/0.4ML ~~LOC~~ SOLN
40.0000 mg | SUBCUTANEOUS | Status: DC
  Filled 2019-06-23: qty 0.4

## 2019-06-23 MED ORDER — DIPHENHYDRAMINE HCL 50 MG/ML IJ SOLN: 50.0000 mg | Freq: Once | INTRAMUSCULAR | Status: DC

## 2019-06-23 MED ORDER — SODIUM CHLORIDE 0.9% FLUSH
3.0000 mL | Freq: Two times a day (BID) | INTRAVENOUS | Status: DC
  Administered 2019-06-23: 22:00:00 3 mL via INTRAVENOUS

## 2019-06-23 MED ORDER — LIDOCAINE HCL (PF) 1 % IJ SOLN
INTRAMUSCULAR | Status: AC
  Filled 2019-06-23: qty 30

## 2019-06-23 MED ORDER — SODIUM CHLORIDE 0.9% FLUSH: 3.0000 mL | Freq: Two times a day (BID) | INTRAVENOUS | Status: DC

## 2019-06-23 MED ORDER — SODIUM CHLORIDE 0.9% FLUSH: 3.0000 mL | INTRAVENOUS | Status: DC | PRN

## 2019-06-23 MED ORDER — HEPARIN (PORCINE) IN NACL 1000-0.9 UT/500ML-% IV SOLN
INTRAVENOUS | Status: DC | PRN
  Administered 2019-06-23 (×2): 500 mL

## 2019-06-23 MED ORDER — PRASUGREL HCL 10 MG PO TABS
10.0000 mg | ORAL_TABLET | ORAL | Status: DC
  Filled 2019-06-23: qty 1

## 2019-06-23 MED ORDER — MIDAZOLAM HCL 2 MG/2ML IJ SOLN
INTRAMUSCULAR | Status: AC
  Filled 2019-06-23: qty 2

## 2019-06-23 MED ORDER — INSULIN GLARGINE (2 UNIT DIAL) 300 UNIT/ML ~~LOC~~ SOPN
55.0000 [IU] | PEN_INJECTOR | Freq: Every day | SUBCUTANEOUS | Status: DC
  Administered 2019-06-23: 23:00:00 55 [IU] via SUBCUTANEOUS

## 2019-06-23 MED ORDER — PRASUGREL HCL 10 MG PO TABS
ORAL_TABLET | ORAL | Status: AC
  Filled 2019-06-23: qty 5

## 2019-06-23 MED ORDER — HYDRALAZINE HCL 20 MG/ML IJ SOLN: 10.0000 mg | INTRAMUSCULAR | Status: AC | PRN

## 2019-06-23 MED ORDER — ASPIRIN 81 MG PO CHEW: 81.0000 mg | CHEWABLE_TABLET | ORAL | Status: DC

## 2019-06-23 MED ORDER — IOHEXOL 350 MG/ML SOLN
INTRAVENOUS | Status: DC | PRN
  Administered 2019-06-23: 70 mL via INTRA_ARTERIAL

## 2019-06-23 MED ORDER — BIVALIRUDIN TRIFLUOROACETATE 250 MG IV SOLR
INTRAVENOUS | Status: AC
  Filled 2019-06-23: qty 250

## 2019-06-23 MED ORDER — PRASUGREL HCL 10 MG PO TABS
ORAL_TABLET | ORAL | Status: AC
  Filled 2019-06-23: qty 1

## 2019-06-23 MED ORDER — NITROGLYCERIN 1 MG/10 ML FOR IR/CATH LAB
INTRA_ARTERIAL | Status: AC
  Filled 2019-06-23: qty 10

## 2019-06-23 SURGICAL SUPPLY — 17 items
BALLN SAPPHIRE ~~LOC~~ 3.5X15 (BALLOONS) ×2 IMPLANT
CATH LAUNCHER 6FR AL1 (CATHETERS) ×1 IMPLANT
CATHETER LAUNCHER 6FR AL1 (CATHETERS) ×2
CATHETER LAUNCHER 6FR MP1 (CATHETERS) ×2 IMPLANT
DEVICE SPIDERFX EMB PROT 3MM (WIRE) ×2 IMPLANT
ELECT DEFIB PAD ADLT CADENCE (PAD) ×2 IMPLANT
HOVERMATT SINGLE USE (MISCELLANEOUS) ×2 IMPLANT
KIT ENCORE 26 ADVANTAGE (KITS) ×2 IMPLANT
KIT HEART LEFT (KITS) ×2 IMPLANT
PACK CARDIAC CATHETERIZATION (CUSTOM PROCEDURE TRAY) ×2 IMPLANT
SHEATH PINNACLE 6F 10CM (SHEATH) ×2 IMPLANT
SHEATH PROBE COVER 6X72 (BAG) ×2 IMPLANT
STENT SYNERGY DES 3.5X32 (Permanent Stent) ×2 IMPLANT
TRANSDUCER W/STOPCOCK (MISCELLANEOUS) ×2 IMPLANT
TUBING CIL FLEX 10 FLL-RA (TUBING) ×2 IMPLANT
WIRE ASAHI PROWATER 180CM (WIRE) ×2 IMPLANT
WIRE EMERALD 3MM-J .035X150CM (WIRE) ×2 IMPLANT

## 2019-06-23 NOTE — Interval H&P Note (Signed)
History and Physical Interval Note:  06/23/2019 12:32 PM  Ruth [name]  has presented today for surgery, with the diagnosis of CAD.  The various methods of treatment have been discussed with the patient and family. After consideration of risks, benefits and other options for treatment, the patient has consented to  Procedure(s): CORONARY STENT INTERVENTION (N/A) as a surgical intervention.  The patient's history has been reviewed, patient examined, no change in status, stable for surgery.  I have reviewed the patient's chart and labs.  Questions were answered to the patient's satisfaction.   Cath Lab Visit (complete for each Cath Lab visit)  Clinical Evaluation Leading to the Procedure:   ACS: Yes.    Non-ACS:    Anginal Classification: CCS IV  Anti-ischemic medical therapy: Maximal Therapy (2 or more classes of medications)  Non-Invasive Test Results: No non-invasive testing performed  Prior CABG: Previous CABG        Ruth [name] [facility] 06/23/2019 12:33 PM

## 2019-06-23 NOTE — Progress Notes (Addendum)
Progress Note  Patient Name: Ruth [name] Date of Encounter: 06/23/2019  Primary Cardiologist: Dr. Claiborne Billings  Subjective   No further chest pain; on iv NTG  Inpatient Medications    Scheduled Meds:  amLODipine  5 mg Oral Daily   aspirin EC  81 mg Oral Daily   Chlorhexidine Gluconate Cloth  6 each Topical Daily   enoxaparin (LOVENOX) injection  40 mg Subcutaneous Q24H   insulin aspart  0-15 Units Subcutaneous TID WC   insulin aspart  0-5 Units Subcutaneous QHS   Insulin Glargine (2 Unit Dial)  50 Units Subcutaneous Daily   irbesartan  150 mg Oral Daily   metoprolol tartrate  100 mg Oral BID   prasugrel  10 mg Oral Daily   simethicone  80 mg Oral Once   sodium chloride flush  3 mL Intravenous Q12H   sodium chloride flush  3 mL Intravenous Q12H   Continuous Infusions:  sodium chloride 10 mL/hr at 06/23/19 0700   famotidine (PEPCID) IV Stopped (06/22/19 2320)   nitroGLYCERIN 15 mcg/min (06/23/19 0700)   PRN Meds: sodium chloride, acetaminophen, calcium carbonate, morphine injection, nitroGLYCERIN, ondansetron (ZOFRAN) IV, sodium chloride flush   Vital Signs    Vitals:   06/23/19 0500 06/23/19 0600 06/23/19 0700 06/23/19 0757  BP:      Pulse:  70 76   Resp: 17 16 (!) 22   Temp:    98.3 F (36.8 C)  TempSrc:    Oral  SpO2:  91% 95%     Intake/Output Summary (Last 24 hours) at 06/23/2019 2947 Last data filed at 06/23/2019 0700 Gross per 24 hour  Intake 602.87 ml  Output --  Net 602.87 ml    I/O since admission: 602.9  There were no vitals filed for this visit.  Telemetry    Sinus at 78 - Personally Reviewed  ECG    06/23/2019 ECG (independently read by me): NSR at 79 with significant improvement in ST changes from yesterday mild residual ST changes in V2 1, aVL  06/22/2019 ECG (independently read by me): NSR at 75 with more prounced ST changes 1, aVL, V2 and 1 mm STE III  06/21/2019 ECG (independently read by me): NSR 75; mild ST  changes 1, aVL, V2  Physical Exam   BP 137/64 (BP Location: Left Arm)    Pulse 76    Temp 98.3 F (36.8 C) (Oral)    Resp (!) 22    SpO2 95%  General: Alert, oriented, no distress.  Skin: normal turgor, no rashes, warm and dry HEENT: Normocephalic, atraumatic. Pupils equal round and reactive to light; sclera anicteric; extraocular muscles intact;  Nose without nasal septal hypertrophy Mouth/Parynx benign; Mallinpatti scale 3 Neck: No JVD, no carotid bruits; normal carotid upstroke Lungs: clear to ausculatation and percussion; no wheezing or rales Chest wall: without tenderness to palpitation Heart: PMI not displaced, RRR, s1 s2 normal, 1/6 systolic murmur, no diastolic murmur, no rubs, gallops, thrills, or heaves Abdomen: soft, nontender; no hepatosplenomehaly, BS+; abdominal aorta nontender and not dilated by palpation. Back: no CVA tenderness Pulses 2+ R groin site stable Musculoskeletal: full range of motion, normal strength, no joint deformities Extremities: no clubbing cyanosis or edema, Homan's sign negative  Neurologic: grossly nonfocal; Cranial nerves grossly wnl Psychologic: Normal mood and affect    Labs    Chemistry Recent Labs  Lab 06/21/19 0954 06/21/19 1226 06/22/19 0546 06/23/19 0246  NA 137  --  137 137  K 4.2  --  3.9 3.9  CL 99  --  101 102  CO2 23  --  22 22  GLUCOSE 244*  --  150* 229*  BUN 8  --  10 19  CREATININE 0.69  --  0.72 0.84  CALCIUM 10.2  --  9.1 9.4  PROT  --  7.9  --   --   ALBUMIN  --  4.1  --   --   AST  --  33  --   --   ALT  --  20  --   --   ALKPHOS  --  76  --   --   BILITOT  --  1.7*  --   --   GFRNONAA >60  --  >60 >60  GFRAA >60  --  >60 >60  ANIONGAP 15  --  14 13     Hematology Recent Labs  Lab 06/21/19 0954 06/22/19 0546 06/23/19 0246  WBC 7.7 7.9 9.5  RBC 5.01 4.75 4.81  HGB 15.6* 14.9 14.7  HCT 46.4* 43.8 44.3  MCV 92.6 92.2 92.1  MCH 31.1 31.4 30.6  MCHC 33.6 34.0 33.2  RDW 12.8 13.0 13.1  PLT 260 258  291    Cardiac EnzymesNo results for input(s): TROPONINI in the last 168 hours. No results for input(s): TROPIPOC in the last 168 hours.   BNPNo results for input(s): BNP, PROBNP in the last 168 hours.   DDimer No results for input(s): DDIMER in the last 168 hours.   Lipid Panel     Component Value Date/Time   CHOL 300 (H) 06/21/2019 1855   TRIG 183 (H) 06/21/2019 1855   HDL 39 (L) 06/21/2019 1855   CHOLHDL 7.7 06/21/2019 1855   VLDL 37 06/21/2019 1855   LDLCALC 224 (H) 06/21/2019 1855   LDLDIRECT 160.9 06/21/2012 0833     Radiology    Dg Chest 2 View  Result Date: 06/21/2019 CLINICAL DATA:  Chest pain EXAM: CHEST - 2 VIEW COMPARISON:  July 07, 2012 FINDINGS: There is mild scarring in the bases. There is no edema or consolidation. Heart is upper normal in size with pulmonary vascularity normal. Patient is status post coronary artery bypass grafting. There is aortic atherosclerosis. No adenopathy. No bone lesions. IMPRESSION: Mild scarring in the bases. No edema or consolidation. Stable cardiac silhouette. Postoperative changes noted. Aortic Atherosclerosis (ICD10-I70.0). Electronically Signed   By: Lowella Grip III M.D.   On: 06/21/2019 10:25   US Abdomen Complete  Result Date: 06/21/2019 CLINICAL DATA:  Abdominal pain. EXAM: ABDOMEN ULTRASOUND COMPLETE COMPARISON:  None. FINDINGS: Gallbladder: No gallstones or wall thickening visualized. No sonographic Murphy sign noted by sonographer. Common bile duct: Diameter: 4 mm Liver: Increased parenchymal echogenicity. No mass or focal lesion. Portal vein is patent on color Doppler imaging with normal direction of blood flow towards the liver. IVC: No abnormality visualized. Pancreas: Visualized portion unremarkable. Spleen: Size and appearance within normal limits. Right Kidney: Length: 11.5 cm. Echogenicity within normal limits. No mass or hydronephrosis visualized. Left Kidney: Length: 11.9 cm. Echogenicity within normal limits. No  mass or hydronephrosis visualized. Abdominal aorta: No aneurysm.  Atherosclerotic irregularity. Other findings: None. IMPRESSION: 1. No acute findings.  No findings to account for abdominal pain. 2. Increased liver parenchymal echogenicity consistent with hepatic steatosis. Aortic atherosclerosis. No other abnormalities. Electronically Signed   By: Lajean Manes M.D.   On: 06/21/2019 20:41    Cardiac Studies    Bypass graft failure with high-grade obstruction and SVG to  RCA (second restenosis.  90% saphenous vein graft to the obtuse marginal.  Patent sequential LIMA to diagonal and LAD.  Occluded mid right coronary  Occluded circumflex  70% proximal LAD and 90% mid LAD.  Occluded second diagonal.  Normal LV function with elevated LVEDP.  Estimated ejection fraction 65%   RECOMMENDATIONS:   IV heparin to start in 4 hours.  IV nitroglycerin if recurrent chest pain.  IV normal saline.  After speaking with Dr. Claiborne Billings, will pause the case and decide if repeat CABG is about option then repeated stenting of 75 year old grafts.     Patient Profile     73 y.o. female with a hx of CAD, CABG 2006, PCI to VG of RCA DES 03/2017, HLD, DM-2, HTN, GERD and upper abd pain, with chronic diarrheawho was admitted for chest/abd pain.  Assessment & Plan    1. USAP/remote CABG: Patient is well-known to me.  Her last catheterization in June 2018 was done in the setting of what sounded like GI symptomatology but actually this is her anginal equivalent.  At that time she was found to have distal stenosis beyond her previously placed stent in the SVG supplying the PDA which was successfully stented.  Her "GI symptoms "resolved.  She represented yesterday with recurrent symptomatology.  Catheterization data was reviewed with Dr. Tamala Julian in the laboratory yesterday.  The patient does not want at this point to consider redo CABG revascularization.  She has a patent LIMA to her LAD.  Following her  procedure yesterday, the patient had recurrent chest pain symptomatology with worsening ECG changes.  Her chest pain is now resolved and her ECG today is improved.  After long discussion with the patient today, she has agreed to undergo attempted intervention.  She had eaten breakfast this morning.  We will make her n.p.o.  Plan to premedicate due to prior contrast allergy and aim for two-vessel intervention to the vein graft supplying her circumflex marginal vessel with stenting and probable PTCA for in-stent restenosis in her SVG to RCA.  The patient has numerous drug intolerances.  But she has been on prasugrel for antiplatelet therapy.  The patient is aware of the risk benefits of the procedure and wishes to pursue intervention today.  2.  Marked hyperlipidemia: The patient states she cannot tolerate statin.  On numerous office visits I discussed PCSK9 inhibition which she at that time was not interested.  I again have strongly recommended institution of therapy particularly with her marked hyperlipidemia with total cholesterol 300 and LDL cholesterol 224.  After much discussion, she now agrees to go through the certification process to institute PCSK9 inhibition therapy.  3.  Diabetes mellitus: On insulin A1c was 8.1; not well controlled with most recent hemoglobin A1c at 8.2  4.  Essential hypertension.  The patient has repeatedly had very high blood pressures when seen in the office setting and she typically will be adamant against medication titration.  Presently she is on amlodipine 5 mg, irbesartan 150 mg, and metoprolol 100 mg twice a day.  4.  Multiple drug allergies: In her words she has "multiple chemical sensitivity" or MCS.   Time spent: 45 minutes Signed, Troy Sine, MD, [facility] 06/23/2019, 8:21 AM

## 2019-06-23 NOTE — Plan of Care (Signed)

## 2019-06-23 NOTE — Progress Notes (Signed)
Inpatient Diabetes Program Recommendations  AACE/ADA: New Consensus Statement on Inpatient Glycemic Control (2015)  Target Ranges:  Prepandial:   less than 140 mg/dL      Peak postprandial:   less than 180 mg/dL (1-2 hours)      Critically ill patients:  140 - 180 mg/dL   Results for Ruth [name], Ruth [name] (MRN [id number]) as of 06/23/2019 07:27  Ref. Range 06/22/2019 07:30 06/22/2019 15:13 06/22/2019 16:58 06/22/2019 21:52  Glucose-Capillary Latest Ref Range: 70 - 99 mg/dL 159 (H)  125 mg Solumedrol X 1 dose given at 11am 220 (H) 227 (H)    50 units LANTUS given at 6:42pm 284 (H)  3 units NOVOLOG     Admit with: CP X 3 days  History: DM, CABG (2006)  Home DM Meds: Toujeo 50 units Daily  Current Orders: Insulin Glargine (2 unit dial) 50 units Daily (4pm)--Pt's home medication      Novolog Moderate Correction Scale/ SSI (0-15 units) TID AC + HS    PCP: Dr. Crist Infante with [facility]    Note patient occasionally refusing Novolog SSI--Refused Novolog yesterday at 8am and again later at 6pm.  Getting her home dose of Basal insulin.  Of note, pt received 125 mg Solumedrol X 1 dose yesterday at 11am which may have raised her CBGs temporarily.  Hopeful CBGs will improve once Solumedrol out of her system.  Lab glucose mildly elevated this AM (229 mg/dl)--Could still be from the effects of the Solumedrol.   Will follow.      Wyn Quaker RN, MSN, CDE Diabetes Coordinator Inpatient Glycemic Control Team Team Pager: [phone] (8a-5p)

## 2019-06-23 NOTE — Progress Notes (Signed)
[facility] PROGRESS NOTE  Ruth [name] ACZ:[id number] DOB: March 21, 1946 DOA: 06/21/2019 PCP: Crist Infante, MD  Assessment/Plan: Unstable angina/Atypical chest pain: s/p cath with stent 1015.nded up from 9-->26--->39> 117. No events on tele. Lipid panel with cholesterol 300, HDL 39, LDL 224 triglycerides 183. EKG: Sinus rhythm, no acute ST elevation or depression noted.Chest x-ray is negative for acute findings. Abdominal US negative. Lipase within limits of normal.  Evaluated by cardiology again today and she was taken back to cath lab.  -Continue aspirin. Patient is allergic to nitro and statin. Morphine PRN for pain control. -further management per cards  Hypertension: fair control. Home meds include amlodipine and metoprolol and valsartan -continue homemeds -Continue to monitor blood pressure.  Coronary artery disease status post CABG in 2006 and cardiac cath in 2018: -see #1 -Continue morphine PRN for pain control. On telemetry -Continue aspirin, metoprolol, valsartan and Effient  Hyperbilirubinemia:Total bilirubin: 1.7, direct bilirubin 0.5, indirect bilirubin: 1.2. Transaminase WNL. Abdominal US unremarkable. significance unclear -monitor  IBS: Patient has chronic diarrhea due to IBS. She is allergic to Bentyl. -Cont. Tums &simethicone. -[facility] evaluation if she continues to have epigastric pain  Hyperlipidemia: lipid panel as noted above -Patient is allergic to statin  Diabetes mellitus: A1c 8.2 -Continue Levemir 50 units daily and sliding scale insulin -Monitor blood sugar closely.    Code Status: full Family Communication: patient Disposition Plan: home hopefully tomorrow   Consultants:  Dr Neita Garnet cards  Dr Claiborne Billings cards  Procedures:  Cardiac cath 10/15  Cardiac cath 10/16  Antibiotics:    HPI/Subjective: Awake alert anxious  Objective: Vitals:   06/23/19 1113 06/23/19 1243  BP:    Pulse:    Resp:    Temp: 97.9 F  (36.6 C)   SpO2:  96%    Intake/Output Summary (Last 24 hours) at 06/23/2019 1316 Last data filed at 06/23/2019 1000 Gross per 24 hour  Intake 546.22 ml  Output -  Net 546.22 ml   Filed Weights   06/23/19 1000  Weight: 83.9 kg    Exam:   General:  Awake denies pain. No acute distress  Cardiovascular: rrr no mgr no LE edema  Respiratory: normal effort BS clear no wheeze  Abdomen: obese soft +BS no guarding or rebounding  Musculoskeletal: joints without swelling/erythema   Data Reviewed: Basic Metabolic Panel: Recent Labs  Lab 06/21/19 0954 06/22/19 0546 06/23/19 0246  NA 137 137 137  K 4.2 3.9 3.9  CL 99 101 102  CO2 23 22 22   GLUCOSE 244* 150* 229*  BUN 8 10 19   CREATININE 0.69 0.72 0.84  CALCIUM 10.2 9.1 9.4   Liver Function Tests: Recent Labs  Lab 06/21/19 1226  AST 33  ALT 20  ALKPHOS 76  BILITOT 1.7*  PROT 7.9  ALBUMIN 4.1   Recent Labs  Lab 06/22/19 0546  LIPASE 29   No results for input(s): AMMONIA in the last 168 hours. CBC: Recent Labs  Lab 06/21/19 0954 06/22/19 0546 06/23/19 0246  WBC 7.7 7.9 9.5  HGB 15.6* 14.9 14.7  HCT 46.4* 43.8 44.3  MCV 92.6 92.2 92.1  PLT 260 258 291   Cardiac Enzymes: No results for input(s): CKTOTAL, CKMB, CKMBINDEX, TROPONINI in the last 168 hours. BNP (last 3 results) No results for input(s): BNP in the last 8760 hours.  ProBNP (last 3 results) No results for input(s): PROBNP in the last 8760 hours.  CBG: Recent Labs  Lab 06/22/19 1513 06/22/19 1658 06/22/19 2152 06/23/19 0655 06/23/19 1115  GLUCAP 220* 227* 284* 187* 161*    Recent Results (from the past 240 hour(s))  SARS CORONAVIRUS 2 (TAT 6-24 HRS) Nasopharyngeal Nasopharyngeal Swab     Status: None   Collection Time: 06/22/19  5:05 AM   Specimen: Nasopharyngeal Swab  Result Value Ref Range Status   SARS Coronavirus 2 NEGATIVE NEGATIVE Final    Comment: (NOTE) SARS-CoV-2 target nucleic acids are NOT DETECTED. The SARS-CoV-2  RNA is generally detectable in upper and lower respiratory specimens during the acute phase of infection. Negative results do not preclude SARS-CoV-2 infection, do not rule out co-infections with other pathogens, and should not be used as the sole basis for treatment or other patient management decisions. Negative results must be combined with clinical observations, patient history, and epidemiological information. The expected result is Negative. Fact Sheet for Patients: SugarRoll.be Fact Sheet for Healthcare Providers: https://www.woods-mathews.com/ This test is not yet approved or cleared by the [place] FDA and  has been authorized for detection and/or diagnosis of SARS-CoV-2 by FDA under an Emergency Use Authorization (EUA). This EUA will remain  in effect (meaning this test can be used) for the duration of the COVID-19 declaration under Section 56 4(b)(1) of the Act, 21 U.S.C. section 360bbb-3(b)(1), unless the authorization is terminated or revoked sooner. Performed at [facility], [place] [address] [place], [place] [address]   MRSA PCR Screening     Status: None   Collection Time: 06/22/19  4:07 PM   Specimen: Nasal Mucosa; Nasopharyngeal  Result Value Ref Range Status   MRSA by PCR NEGATIVE NEGATIVE Final    Comment:        The GeneXpert MRSA Assay (FDA approved for NASAL specimens only), is one component of a comprehensive MRSA colonization surveillance program. It is not intended to diagnose MRSA infection nor to guide or monitor treatment for MRSA infections. Performed at [facility], [place] [address] [place], [place] [address]      Studies: US Abdomen Complete  Result Date: 06/21/2019 CLINICAL DATA:  Abdominal pain. EXAM: ABDOMEN ULTRASOUND COMPLETE COMPARISON:  None. FINDINGS: Gallbladder: No gallstones or wall thickening visualized. No sonographic Murphy sign noted by sonographer. Common bile duct:  Diameter: 4 mm Liver: Increased parenchymal echogenicity. No mass or focal lesion. Portal vein is patent on color Doppler imaging with normal direction of blood flow towards the liver. IVC: No abnormality visualized. Pancreas: Visualized portion unremarkable. Spleen: Size and appearance within normal limits. Right Kidney: Length: 11.5 cm. Echogenicity within normal limits. No mass or hydronephrosis visualized. Left Kidney: Length: 11.9 cm. Echogenicity within normal limits. No mass or hydronephrosis visualized. Abdominal aorta: No aneurysm.  Atherosclerotic irregularity. Other findings: None. IMPRESSION: 1. No acute findings.  No findings to account for abdominal pain. 2. Increased liver parenchymal echogenicity consistent with hepatic steatosis. Aortic atherosclerosis. No other abnormalities. Electronically Signed   By: Lajean Manes M.D.   On: 06/21/2019 20:41    Scheduled Meds: . [MAR Hold] amLODipine  5 mg Oral Daily  . aspirin  81 mg Oral Pre-Cath  . [MAR Hold] aspirin EC  81 mg Oral Daily  . [MAR Hold] Chlorhexidine Gluconate Cloth  6 each Topical Daily  . [MAR Hold] enoxaparin (LOVENOX) injection  40 mg Subcutaneous Q24H  . [MAR Hold] insulin aspart  0-15 Units Subcutaneous TID WC  . [MAR Hold] insulin aspart  0-5 Units Subcutaneous QHS  . [MAR Hold] Insulin Glargine (2 Unit Dial)  50 Units Subcutaneous Daily  . [MAR Hold] irbesartan  150 mg Oral Daily  . [MAR Hold] metoprolol tartrate  100 mg Oral BID  . prasugrel  10 mg Oral Pre-Cath  . [MAR Hold] prasugrel  10 mg Oral Daily  . [MAR Hold] simethicone  80 mg Oral Once  . [MAR Hold] sodium chloride flush  3 mL Intravenous Q12H  . [MAR Hold] sodium chloride flush  3 mL Intravenous Q12H  . [MAR Hold] sodium chloride flush  3 mL Intravenous Q12H   Continuous Infusions: . [MAR Hold] sodium chloride 10 mL/hr at 06/23/19 1229  . sodium chloride    . sodium chloride 1 mL/kg/hr (06/23/19 1147)  . bivalirudin (ANGIOMAX) infusion 5 mg/mL 1.75  mg/kg/hr (06/23/19 1251)  . [MAR Hold] famotidine (PEPCID) IV Stopped (06/23/19 1231)  . [MAR Hold] nitroGLYCERIN 20 mcg/min (06/23/19 1149)    Principal Problem:   Unstable angina (HCC) Active Problems:   HTN (hypertension)   Chest pain   Coronary artery disease   Hyperlipidemia with target LDL less than 70   Diabetes mellitus (HCC)   GERD (gastroesophageal reflux disease)   Obesity (BMI 30-39.9)   IBS (irritable bowel syndrome)   Hyperbilirubinemia   Non-ST elevation (NSTEMI) myocardial infarction [facility])    Time spent: 30 minu    [facility] M NP  [facility]  If 7PM-7AM, please contact night-coverage at [email or web address], password [email or web address] 06/23/2019, 1:16 PM  LOS: 1 day

## 2019-06-23 NOTE — Progress Notes (Signed)
6Fr arterial sheath pulled from left side. Manual pressure held for 20 minutes. Level 0 before and after sheath removed. Clean gauze and tegaderm drsg applied to area. Care instructions given to pt and bedrest starts at 1545 for 4hrs.

## 2019-06-23 NOTE — H&P (View-Only) (Signed)
Progress Note  Patient Name: Ruth [name] Date of Encounter: 06/23/2019  Primary Cardiologist: Dr. Claiborne Billings  Subjective   No further chest pain; on iv NTG  Inpatient Medications    Scheduled Meds:  amLODipine  5 mg Oral Daily   aspirin EC  81 mg Oral Daily   Chlorhexidine Gluconate Cloth  6 each Topical Daily   enoxaparin (LOVENOX) injection  40 mg Subcutaneous Q24H   insulin aspart  0-15 Units Subcutaneous TID WC   insulin aspart  0-5 Units Subcutaneous QHS   Insulin Glargine (2 Unit Dial)  50 Units Subcutaneous Daily   irbesartan  150 mg Oral Daily   metoprolol tartrate  100 mg Oral BID   prasugrel  10 mg Oral Daily   simethicone  80 mg Oral Once   sodium chloride flush  3 mL Intravenous Q12H   sodium chloride flush  3 mL Intravenous Q12H   Continuous Infusions:  sodium chloride 10 mL/hr at 06/23/19 0700   famotidine (PEPCID) IV Stopped (06/22/19 2320)   nitroGLYCERIN 15 mcg/min (06/23/19 0700)   PRN Meds: sodium chloride, acetaminophen, calcium carbonate, morphine injection, nitroGLYCERIN, ondansetron (ZOFRAN) IV, sodium chloride flush   Vital Signs    Vitals:   06/23/19 0500 06/23/19 0600 06/23/19 0700 06/23/19 0757  BP:      Pulse:  70 76   Resp: 17 16 (!) 22   Temp:    98.3 F (36.8 C)  TempSrc:    Oral  SpO2:  91% 95%     Intake/Output Summary (Last 24 hours) at 06/23/2019 7416 Last data filed at 06/23/2019 0700 Gross per 24 hour  Intake 602.87 ml  Output --  Net 602.87 ml    I/O since admission: 602.9  There were no vitals filed for this visit.  Telemetry    Sinus at 78 - Personally Reviewed  ECG    06/23/2019 ECG (independently read by me): NSR at 79 with significant improvement in ST changes from yesterday mild residual ST changes in V2 1, aVL  06/22/2019 ECG (independently read by me): NSR at 75 with more prounced ST changes 1, aVL, V2 and 1 mm STE III  06/21/2019 ECG (independently read by me): NSR 75; mild ST  changes 1, aVL, V2  Physical Exam   BP 137/64 (BP Location: Left Arm)    Pulse 76    Temp 98.3 F (36.8 C) (Oral)    Resp (!) 22    SpO2 95%  General: Alert, oriented, no distress.  Skin: normal turgor, no rashes, warm and dry HEENT: Normocephalic, atraumatic. Pupils equal round and reactive to light; sclera anicteric; extraocular muscles intact;  Nose without nasal septal hypertrophy Mouth/Parynx benign; Mallinpatti scale 3 Neck: No JVD, no carotid bruits; normal carotid upstroke Lungs: clear to ausculatation and percussion; no wheezing or rales Chest wall: without tenderness to palpitation Heart: PMI not displaced, RRR, s1 s2 normal, 1/6 systolic murmur, no diastolic murmur, no rubs, gallops, thrills, or heaves Abdomen: soft, nontender; no hepatosplenomehaly, BS+; abdominal aorta nontender and not dilated by palpation. Back: no CVA tenderness Pulses 2+ R groin site stable Musculoskeletal: full range of motion, normal strength, no joint deformities Extremities: no clubbing cyanosis or edema, Homan's sign negative  Neurologic: grossly nonfocal; Cranial nerves grossly wnl Psychologic: Normal mood and affect    Labs    Chemistry Recent Labs  Lab 06/21/19 0954 06/21/19 1226 06/22/19 0546 06/23/19 0246  NA 137  --  137 137  K 4.2  --  3.9 3.9  CL 99  --  101 102  CO2 23  --  22 22  GLUCOSE 244*  --  150* 229*  BUN 8  --  10 19  CREATININE 0.69  --  0.72 0.84  CALCIUM 10.2  --  9.1 9.4  PROT  --  7.9  --   --   ALBUMIN  --  4.1  --   --   AST  --  33  --   --   ALT  --  20  --   --   ALKPHOS  --  76  --   --   BILITOT  --  1.7*  --   --   GFRNONAA >60  --  >60 >60  GFRAA >60  --  >60 >60  ANIONGAP 15  --  14 13     Hematology Recent Labs  Lab 06/21/19 0954 06/22/19 0546 06/23/19 0246  WBC 7.7 7.9 9.5  RBC 5.01 4.75 4.81  HGB 15.6* 14.9 14.7  HCT 46.4* 43.8 44.3  MCV 92.6 92.2 92.1  MCH 31.1 31.4 30.6  MCHC 33.6 34.0 33.2  RDW 12.8 13.0 13.1  PLT 260 258  291    Cardiac EnzymesNo results for input(s): TROPONINI in the last 168 hours. No results for input(s): TROPIPOC in the last 168 hours.   BNPNo results for input(s): BNP, PROBNP in the last 168 hours.   DDimer No results for input(s): DDIMER in the last 168 hours.   Lipid Panel     Component Value Date/Time   CHOL 300 (H) 06/21/2019 1855   TRIG 183 (H) 06/21/2019 1855   HDL 39 (L) 06/21/2019 1855   CHOLHDL 7.7 06/21/2019 1855   VLDL 37 06/21/2019 1855   LDLCALC 224 (H) 06/21/2019 1855   LDLDIRECT 160.9 06/21/2012 0833     Radiology    Dg Chest 2 View  Result Date: 06/21/2019 CLINICAL DATA:  Chest pain EXAM: CHEST - 2 VIEW COMPARISON:  July 07, 2012 FINDINGS: There is mild scarring in the bases. There is no edema or consolidation. Heart is upper normal in size with pulmonary vascularity normal. Patient is status post coronary artery bypass grafting. There is aortic atherosclerosis. No adenopathy. No bone lesions. IMPRESSION: Mild scarring in the bases. No edema or consolidation. Stable cardiac silhouette. Postoperative changes noted. Aortic Atherosclerosis (ICD10-I70.0). Electronically Signed   By: Lowella Grip III M.D.   On: 06/21/2019 10:25   US Abdomen Complete  Result Date: 06/21/2019 CLINICAL DATA:  Abdominal pain. EXAM: ABDOMEN ULTRASOUND COMPLETE COMPARISON:  None. FINDINGS: Gallbladder: No gallstones or wall thickening visualized. No sonographic Murphy sign noted by sonographer. Common bile duct: Diameter: 4 mm Liver: Increased parenchymal echogenicity. No mass or focal lesion. Portal vein is patent on color Doppler imaging with normal direction of blood flow towards the liver. IVC: No abnormality visualized. Pancreas: Visualized portion unremarkable. Spleen: Size and appearance within normal limits. Right Kidney: Length: 11.5 cm. Echogenicity within normal limits. No mass or hydronephrosis visualized. Left Kidney: Length: 11.9 cm. Echogenicity within normal limits. No  mass or hydronephrosis visualized. Abdominal aorta: No aneurysm.  Atherosclerotic irregularity. Other findings: None. IMPRESSION: 1. No acute findings.  No findings to account for abdominal pain. 2. Increased liver parenchymal echogenicity consistent with hepatic steatosis. Aortic atherosclerosis. No other abnormalities. Electronically Signed   By: Lajean Manes M.D.   On: 06/21/2019 20:41    Cardiac Studies    Bypass graft failure with high-grade obstruction and SVG to  RCA (second restenosis.  90% saphenous vein graft to the obtuse marginal.  Patent sequential LIMA to diagonal and LAD.  Occluded mid right coronary  Occluded circumflex  70% proximal LAD and 90% mid LAD.  Occluded second diagonal.  Normal LV function with elevated LVEDP.  Estimated ejection fraction 65%   RECOMMENDATIONS:   IV heparin to start in 4 hours.  IV nitroglycerin if recurrent chest pain.  IV normal saline.  After speaking with Dr. Claiborne Billings, will pause the case and decide if repeat CABG is about option then repeated stenting of 74 year old grafts.     Patient Profile     73 y.o. female with a hx of CAD, CABG 2006, PCI to VG of RCA DES 03/2017, HLD, DM-2, HTN, GERD and upper abd pain, with chronic diarrheawho was admitted for chest/abd pain.  Assessment & Plan    1. USAP/remote CABG: Patient is well-known to me.  Her last catheterization in June 2018 was done in the setting of what sounded like GI symptomatology but actually this is her anginal equivalent.  At that time she was found to have distal stenosis beyond her previously placed stent in the SVG supplying the PDA which was successfully stented.  Her "GI symptoms "resolved.  She represented yesterday with recurrent symptomatology.  Catheterization data was reviewed with Dr. Tamala Julian in the laboratory yesterday.  The patient does not want at this point to consider redo CABG revascularization.  She has a patent LIMA to her LAD.  Following her  procedure yesterday, the patient had recurrent chest pain symptomatology with worsening ECG changes.  Her chest pain is now resolved and her ECG today is improved.  After long discussion with the patient today, she has agreed to undergo attempted intervention.  She had eaten breakfast this morning.  We will make her n.p.o.  Plan to premedicate due to prior contrast allergy and aim for two-vessel intervention to the vein graft supplying her circumflex marginal vessel with stenting and probable PTCA for in-stent restenosis in her SVG to RCA.  The patient has numerous drug intolerances.  But she has been on prasugrel for antiplatelet therapy.  The patient is aware of the risk benefits of the procedure and wishes to pursue intervention today.  2.  Marked hyperlipidemia: The patient states she cannot tolerate statin.  On numerous office visits I discussed PCSK9 inhibition which she at that time was not interested.  I again have strongly recommended institution of therapy particularly with her marked hyperlipidemia with total cholesterol 300 and LDL cholesterol 224.  After much discussion, she now agrees to go through the certification process to institute PCSK9 inhibition therapy.  3.  Diabetes mellitus: On insulin A1c was 8.1; not well controlled with most recent hemoglobin A1c at 8.2  4.  Essential hypertension.  The patient has repeatedly had very high blood pressures when seen in the office setting and she typically will be adamant against medication titration.  Presently she is on amlodipine 5 mg, irbesartan 150 mg, and metoprolol 100 mg twice a day.  4.  Multiple drug allergies: In her words she has "multiple chemical sensitivity" or MCS.   Time spent: 45 minutes Signed, Troy Sine, MD, [facility] 06/23/2019, 8:21 AM

## 2019-06-24 DIAGNOSIS — I200 Unstable angina: Secondary | ICD-10-CM | POA: Diagnosis not present

## 2019-06-24 LAB — CBC
HCT: 40.7 % (ref 36.0–46.0)
Hemoglobin: 13.4 g/dL (ref 12.0–15.0)
MCH: 30.5 pg (ref 26.0–34.0)
MCHC: 32.9 g/dL (ref 30.0–36.0)
MCV: 92.5 fL (ref 80.0–100.0)
Platelets: 263 10*3/uL (ref 150–400)
RBC: 4.4 MIL/uL (ref 3.87–5.11)
RDW: 12.9 % (ref 11.5–15.5)
WBC: 11.5 10*3/uL — ABNORMAL HIGH (ref 4.0–10.5)
nRBC: 0 % (ref 0.0–0.2)

## 2019-06-24 LAB — GLUCOSE, CAPILLARY
Glucose-Capillary: 197 mg/dL — ABNORMAL HIGH (ref 70–99)
Glucose-Capillary: 183 mg/dL — ABNORMAL HIGH (ref 70–99)

## 2019-06-24 LAB — BASIC METABOLIC PANEL
Anion gap: 10 (ref 5–15)
BUN: 19 mg/dL (ref 8–23)
CO2: 23 mmol/L (ref 22–32)
Calcium: 9.2 mg/dL (ref 8.9–10.3)
Chloride: 106 mmol/L (ref 98–111)
Creatinine, Ser: 0.7 mg/dL (ref 0.44–1.00)
GFR calc Af Amer: >60 mL/min (ref >60)
GFR calc non Af Amer: >60 mL/min (ref >60)
Glucose, Bld: 238 mg/dL — ABNORMAL HIGH (ref 70–99)
Potassium: 3.9 mmol/L (ref 3.5–5.1)
Sodium: 139 mmol/L (ref 135–145)

## 2019-06-24 MED ORDER — VALSARTAN 160 MG PO TABS
160.0000 mg | ORAL_TABLET | Freq: Two times a day (BID) | ORAL | Status: DC
  Administered 2019-06-24: 160 mg via ORAL
  Filled 2019-06-24: qty 1

## 2019-06-24 NOTE — Progress Notes (Signed)
CARDIAC REHAB PHASE I   PRE:  Rate/Rhythm: 66 NSR  BP:  Supine:   Sitting: 148/77  Standing:    SaO2: 96 RA  MODE:  Ambulation: 470 ft   POST:  Rate/Rhythem: 72 NSR  BP:  Supine:   Sitting: 153/68  Standing:    SaO2: 97 RA  [id number] Patient ambulated in hallway independently. Denies complaints. Steady gait noted. CR RN assisted with mobility of IV pole. Post ambulation patient back to bedside sitting with call bell and telephone in place. Discharge education completed including providing patient with stent card, diet and nutrition, activity progression, antiplatelet therapy, activity restrictions. Discussed phase 2 and VCR. Patient states she is unsure if Phase 2 cardiac rehab will be an option for her secondary to her IBS and constant need to toilet with exercise. She is very interested in VCR as she exercises at home, has BP monitor and pulse ox, and smart phone. Phase 2 referral places per patients wish to consider and make final decision after contact from CR outpatient staff.  Pt is interested in participating in Virtual Cardiac and Pulmonary Rehab. Pt advised that Virtual Cardiac and Pulmonary Rehab is provided at no cost to the patient.  Checklist:  1. Pt has smart device  ie smartphone and/or ipad for downloading an app  Yes 2. Reliable internet/wifi service    Yes 3. Understands how to use their smartphone and navigate within an app.  Yes   Pt verbalized understanding and is in agreement.  Ruth Carol, RN, BSN

## 2019-06-24 NOTE — Discharge Instructions (Signed)
Femoral Site Care This sheet gives you information about how to care for yourself after your procedure. Your health care provider may also give you more specific instructions. If you have problems or questions, contact your health care provider. What can I expect after the procedure? After the procedure, it is common to have:  Bruising that usually fades within 1-2 weeks.  Tenderness at the site. Follow these instructions at home: Wound care  Follow instructions from your health care provider about how to take care of your insertion site. Make sure you: ? Wash your hands with soap and water before you change your bandage (dressing). If soap and water are not available, use hand sanitizer. ? Change your dressing as told by your health care provider. ? Leave stitches (sutures), skin glue, or adhesive strips in place. These skin closures may need to stay in place for 2 weeks or longer. If adhesive strip edges start to loosen and curl up, you may trim the loose edges. Do not remove adhesive strips completely unless your health care provider tells you to do that.  Do not take baths, swim, or use a hot tub until your health care provider approves.  You may shower 24-48 hours after the procedure or as told by your health care provider. ? Gently wash the site with plain soap and water. ? Pat the area dry with a clean towel. ? Do not rub the site. This may cause bleeding.  Do not apply powder or lotion to the site. Keep the site clean and dry.  Check your femoral site every day for signs of infection. Check for: ? Redness, swelling, or pain. ? Fluid or blood. ? Warmth. ? Pus or a bad smell. Activity  For the first 2-3 days after your procedure, or as long as directed: ? Avoid climbing stairs as much as possible. ? Do not squat.  Do not lift anything that is heavier than 10 lb (4.5 kg), or the limit that you are told, until your health care provider says that it is safe.  Rest as  directed. ? Avoid sitting for a long time without moving. Get up to take short walks every 1-2 hours.  Do not drive for 24 hours if you were given a medicine to help you relax (sedative). General instructions  Take over-the-counter and prescription medicines only as told by your health care provider.  Keep all follow-up visits as told by your health care provider. This is important. Contact a health care provider if you have:  A fever or chills.  You have redness, swelling, or pain around your insertion site. Get help right away if:  The catheter insertion area swells very fast.  You pass out.  You suddenly start to sweat or your skin gets clammy.  The catheter insertion area is bleeding, and the bleeding does not stop when you hold steady pressure on the area.  The area near or just beyond the catheter insertion site becomes pale, cool, tingly, or numb. These symptoms may represent a serious problem that is an emergency. Do not wait to see if the symptoms will go away. Get medical help right away. Call your local emergency services (911 in the [place] Do not drive yourself to the hospital. Summary  After the procedure, it is common to have bruising that usually fades within 1-2 weeks.  Check your femoral site every day for signs of infection.  Do not lift anything that is heavier than 10 lb (4.5 kg), or the  limit that you are told, until your health care provider says that it is safe. This information is not intended to replace advice given to you by your health care provider. Make sure you discuss any questions you have with your health care provider. Document Released: 04/27/2014 Document Revised: 09/06/2017 Document Reviewed: 09/06/2017 Elsevier Patient Education  2020 [place] Heart-healthy meal planning includes:  Eating less unhealthy fats.  Eating more healthy fats.  Making other changes in your diet. Talk with your doctor or a  diet specialist (dietitian) to create an eating plan that is right for you. What is my plan? Your doctor may recommend an eating plan that includes:  Total fat: ______% or less of total calories a day.  Saturated fat: ______% or less of total calories a day.  Cholesterol: less than _________mg a day. What are tips for following this plan? Cooking Avoid frying your food. Try to bake, boil, grill, or broil it instead. You can also reduce fat by:  Removing the skin from poultry.  Removing all visible fats from meats.  Steaming vegetables in water or broth. Meal planning   At meals, divide your plate into four equal parts: ? Fill one-half of your plate with vegetables and green salads. ? Fill one-fourth of your plate with whole grains. ? Fill one-fourth of your plate with lean protein foods.  Eat 4-5 servings of vegetables per day. A serving of vegetables is: ? 1 cup of raw or cooked vegetables. ? 2 cups of raw leafy greens.  Eat 4-5 servings of fruit per day. A serving of fruit is: ? 1 medium whole fruit. ?  cup of dried fruit. ?  cup of fresh, frozen, or canned fruit. ?  cup of 100% fruit juice.  Eat more foods that have soluble fiber. These are apples, broccoli, carrots, beans, peas, and barley. Try to get 20-30 g of fiber per day.  Eat 4-5 servings of nuts, legumes, and seeds per week: ? 1 serving of dried beans or legumes equals  cup after being cooked. ? 1 serving of nuts is  cup. ? 1 serving of seeds equals 1 tablespoon. General information  Eat more home-cooked food. Eat less restaurant, buffet, and fast food.  Limit or avoid alcohol.  Limit foods that are high in starch and sugar.  Avoid fried foods.  Lose weight if you are overweight.  Keep track of how much salt (sodium) you eat. This is important if you have high blood pressure. Ask your doctor to tell you more about this.  Try to add vegetarian meals each week. Fats  Choose healthy fats. These  include olive oil and canola oil, flaxseeds, walnuts, almonds, and seeds.  Eat more omega-3 fats. These include salmon, mackerel, sardines, tuna, flaxseed oil, and ground flaxseeds. Try to eat fish at least 2 times each week.  Check food labels. Avoid foods with trans fats or high amounts of saturated fat.  Limit saturated fats. ? These are often found in animal products, such as meats, butter, and cream. ? These are also found in plant foods, such as palm oil, palm kernel oil, and coconut oil.  Avoid foods with partially hydrogenated oils in them. These have trans fats. Examples are stick margarine, some tub margarines, cookies, crackers, and other baked goods. What foods can I eat? Fruits All fresh, canned (in natural juice), or frozen fruits. Vegetables Fresh or frozen vegetables (raw, steamed, roasted, or grilled). Green salads. Grains Most grains.  Choose whole wheat and whole grains most of the time. Rice and pasta, including brown rice and pastas made with whole wheat. Meats and other proteins Lean, well-trimmed beef, veal, pork, and lamb. Chicken and [place] without skin. All fish and shellfish. Wild duck, rabbit, pheasant, and venison. Egg whites or low-cholesterol egg substitutes. Dried beans, peas, lentils, and tofu. Seeds and most nuts. Dairy Low-fat or nonfat cheeses, including ricotta and mozzarella. Skim or 1% milk that is liquid, powdered, or evaporated. Buttermilk that is made with low-fat milk. Nonfat or low-fat yogurt. Fats and oils Non-hydrogenated (trans-free) margarines. Vegetable oils, including soybean, sesame, sunflower, olive, peanut, safflower, corn, canola, and cottonseed. Salad dressings or mayonnaise made with a vegetable oil. Beverages Mineral water. Coffee and tea. Diet carbonated beverages. Sweets and desserts Sherbet, gelatin, and fruit ice. Small amounts of dark chocolate. Limit all sweets and desserts. Seasonings and condiments All seasonings and  condiments. The items listed above may not be a complete list of foods and drinks you can eat. Contact a dietitian for more options. What foods should I avoid? Fruits Canned fruit in heavy syrup. Fruit in cream or butter sauce. Fried fruit. Limit coconut. Vegetables Vegetables cooked in cheese, cream, or butter sauce. Fried vegetables. Grains Breads that are made with saturated or trans fats, oils, or whole milk. Croissants. Sweet rolls. Donuts. High-fat crackers, such as cheese crackers. Meats and other proteins Fatty meats, such as hot dogs, ribs, sausage, bacon, rib-eye roast or steak. High-fat deli meats, such as salami and bologna. Caviar. Domestic duck and goose. Organ meats, such as liver. Dairy Cream, sour cream, cream cheese, and creamed cottage cheese. Whole-milk cheeses. Whole or 2% milk that is liquid, evaporated, or condensed. Whole buttermilk. Cream sauce or high-fat cheese sauce. Yogurt that is made from whole milk. Fats and oils Meat fat, or shortening. Cocoa butter, hydrogenated oils, palm oil, coconut oil, palm kernel oil. Solid fats and shortenings, including bacon fat, salt pork, lard, and butter. Nondairy cream substitutes. Salad dressings with cheese or sour cream. Beverages Regular sodas and juice drinks with added sugar. Sweets and desserts Frosting. Pudding. Cookies. Cakes. Pies. Milk chocolate or white chocolate. Buttered syrups. Full-fat ice cream or ice cream drinks. The items listed above may not be a complete list of foods and drinks to avoid. Contact a dietitian for more information. Summary  Heart-healthy meal planning includes eating less unhealthy fats, eating more healthy fats, and making other changes in your diet.  Eat a balanced diet. This includes fruits and vegetables, low-fat or nonfat dairy, lean protein, nuts and legumes, whole grains, and heart-healthy oils and fats. This information is not intended to replace advice given to you by your health  care provider. Make sure you discuss any questions you have with your health care provider. Document Released: 02/23/2012 Document Revised: 10/28/2017 Document Reviewed: 10/01/2017 Elsevier Patient Education  2020 [employer].

## 2019-06-24 NOTE — Progress Notes (Signed)
Progress Note  Patient Name: Ruth [name] Date of Encounter: 06/24/2019  Primary Cardiologist: [name], MD  Subjective   Up in room this morning, ate breakfast.  No chest pain or breathlessness, states that she feels much better.  Inpatient Medications    Scheduled Meds: . amLODipine  5 mg Oral Daily  . aspirin EC  81 mg Oral Daily  . Chlorhexidine Gluconate Cloth  6 each Topical Daily  . enoxaparin (LOVENOX) injection  40 mg Subcutaneous Q24H  . insulin aspart  0-15 Units Subcutaneous TID WC  . insulin aspart  0-5 Units Subcutaneous QHS  . Insulin Glargine (2 Unit Dial)  55 Units Subcutaneous q1800  . metoprolol tartrate  100 mg Oral BID  . prasugrel  10 mg Oral Daily  . simethicone  80 mg Oral Once  . sodium chloride flush  3 mL Intravenous Q12H  . sodium chloride flush  3 mL Intravenous Q12H  . sodium chloride flush  3 mL Intravenous Q12H  . sodium chloride flush  3 mL Intravenous Q12H  . valsartan  160 mg Oral BID   Continuous Infusions: . sodium chloride Stopped (06/23/19 1930)  . sodium chloride    . famotidine (PEPCID) IV 20 mg (06/24/19 8144)  . nitroGLYCERIN Stopped (06/23/19 1930)   PRN Meds: sodium chloride, sodium chloride, acetaminophen, calcium carbonate, morphine injection, nitroGLYCERIN, ondansetron (ZOFRAN) IV, sodium chloride flush, sodium chloride flush   Vital Signs    Vitals:   06/23/19 2115 06/23/19 2330 06/24/19 0505 06/24/19 0738  BP: (!) 180/73 (!) 158/75 (!) 159/81 (!) 204/107  Pulse: 90 69 82 75  Resp: 16 16 19 17   Temp: 98.1 F (36.7 C)  98.2 F (36.8 C) 98.2 F (36.8 C)  TempSrc: Oral  Oral Oral  SpO2: 96% 95% 96% 96%  Weight:   83.1 kg     Intake/Output Summary (Last 24 hours) at 06/24/2019 0841 Last data filed at 06/24/2019 0600 Gross per 24 hour  Intake 443.56 ml  Output -  Net 443.56 ml   Filed Weights   06/23/19 1000 06/24/19 0505  Weight: 83.9 kg 83.1 kg    Telemetry    Sinus rhythm.  Personally  reviewed.  ECG    An ECG dated 06/23/2019 was personally reviewed today and demonstrated:  Sinus rhythm with nonspecific ST-T changes.  Physical Exam   GEN: No acute distress.   Neck: No JVD. Cardiac: RRR, soft systolic murmur. Respiratory: Nonlabored. Clear to auscultation bilaterally. GI: Soft, nontender, bowel sounds present. MS: No edema; No deformity. Neuro:  Nonfocal. Psych: Alert and oriented x 3. Normal affect.  Labs    Chemistry Recent Labs  Lab 06/21/19 1226 06/22/19 0546 06/23/19 0246 06/24/19 0357  NA  --  137 137 139  K  --  3.9 3.9 3.9  CL  --  101 102 106  CO2  --  22 22 23   GLUCOSE  --  150* 229* 238*  BUN  --  10 19 19   CREATININE  --  0.72 0.84 0.70  CALCIUM  --  9.1 9.4 9.2  PROT 7.9  --   --   --   ALBUMIN 4.1  --   --   --   AST 33  --   --   --   ALT 20  --   --   --   ALKPHOS 76  --   --   --   BILITOT 1.7*  --   --   --  GFRNONAA  --  >60 >60 >60  GFRAA  --  >60 >60 >60  ANIONGAP  --  14 13 10      Hematology Recent Labs  Lab 06/22/19 0546 06/23/19 0246 06/24/19 0357  WBC 7.9 9.5 11.5*  RBC 4.75 4.81 4.40  HGB 14.9 14.7 13.4  HCT 43.8 44.3 40.7  MCV 92.2 92.1 92.5  MCH 31.4 30.6 30.5  MCHC 34.0 33.2 32.9  RDW 13.0 13.1 12.9  PLT 258 291 263    Cardiac Enzymes Recent Labs  Lab 06/21/19 1226 06/21/19 1551 06/21/19 1855 06/22/19 0546 06/23/19 0958  TROPONINIHS 26* 39* 53* 117* 446*    Radiology    No results found.  Cardiac Studies   PCI 06/23/2019:  SVG.  Prox Graft to Mid Graft lesion is 90% stenosed.  A drug-eluting stent was successfully placed using a STENT SYNERGY DES 3.5X32.  Post intervention, there is a 0% residual stenosis.  SVG.  Dist Graft lesion is 95% stenosed.  Balloon angioplasty was performed using a BALLOON SAPPHIRE Sobieski 3.5X15.  Post intervention, there is a 0% residual stenosis.   1. Successful PCI of the SVG to OM with DES x 1 2. Successful PCI of the SVG to RCA with POBA.    Plan: DAPT for at least one year and I would consider indefinitely. Anticipate DC in am if stable.   Cardiac catheterization 06/22/2019:  Bypass graft failure with high-grade obstruction and SVG to RCA (second restenosis.  90% saphenous vein graft to the obtuse marginal.  Patent sequential LIMA to diagonal and LAD.  Occluded mid right coronary  Occluded circumflex  70% proximal LAD and 90% mid LAD.  Occluded second diagonal.  Normal LV function with elevated LVEDP.  Estimated ejection fraction 65%   RECOMMENDATIONS:   IV heparin to start in 4 hours.  IV nitroglycerin if recurrent chest pain.  IV normal saline.  After speaking with Dr. Georgina Peer, will pause the case and decide if repeat CABG is about option then repeated stenting of 75 year old grafts.  Echocardiogram 06/22/2019:  1. Left ventricular ejection fraction, by visual estimation, is 60 to 65%. The left ventricle has normal function. Normal left ventricular size. Left ventricular septal wall thickness was mildly increased. Mildly increased left ventricular posterior  wall thickness. There is mildly increased left ventricular hypertrophy.  2. Left ventricular diastolic Doppler parameters are consistent with impaired relaxation pattern of LV diastolic filling.  3. Global right ventricle has normal systolic function.The right ventricular size is normal. No increase in right ventricular wall thickness.  4. Left atrial size was normal.  5. Right atrial size was normal.  6. Mild calcification of the anterior mitral valve leaflet(s).  7. Mild thickening of the anterior mitral valve leaflet(s).  8. The mitral valve is normal in structure. Trace mitral valve regurgitation. No evidence of mitral stenosis.  9. The tricuspid valve is normal in structure. Tricuspid valve regurgitation is trivial. 10. The aortic valve is normal in structure. Aortic valve regurgitation was not visualized by color flow Doppler. Mild aortic valve  sclerosis without stenosis. 11. Mildly calcified and thickened aortic valve leaflets. 12. The pulmonic valve was normal in structure. Pulmonic valve regurgitation is not visualized by color flow Doppler. 13. Mildly elevated pulmonary artery systolic pressure. 14. The inferior vena cava is normal in size with greater than 50% respiratory variability, suggesting right atrial pressure of 3 mmHg.  Patient Profile     73 y.o. female with a history of CAD, CABG 2006, PCI to  VG of RCA DES 03/2017, HLD, DM-2, HTN, GERD and upper abd pain, with chronic diarrheawhowas admitted forchest/abd pain.  Assessment & Plan    1.  Unstable angina in the setting of multivessel CAD status post CABG and subsequent PCI.  During this hospital stay she underwent intervention including DES to the SVG to OM1 and angioplasty of the SVG to RCA on October 16.  LVEF is normal in the range of 60 to 65%.  2.  Numerous drug intolerances.  3.  Mixed hyperlipidemia with statin intolerance.  Dr. Claiborne Billings has spoken with the patient extensively with plan to undergo evaluation for assistance with PCSK9 inhibitor.  4.  Essential hypertension, significantly elevated at times as noted by Dr. Claiborne Billings and to some degree secondary to drug intolerances and patient concerned about changing doses.  She is on Norvasc and metoprolol on outpatient dose, also valsartan 160 mg twice daily which she has not been receiving in the hospital.  Anticipate discharge home today.  Cardiac regimen to include aspirin, Effient, Lopressor, Diovan, and Norvasc at current doses.  Plan is for further outpatient assessment for assistance with PCSK9 inhibitor.  She will need to have follow-up with Dr. Claiborne Billings or APP within 10 days.  Signed, Rozann Lesches, MD  06/24/2019, 8:41 AM

## 2019-06-26 ENCOUNTER — Encounter (HOSPITAL_COMMUNITY): Payer: Self-pay | Admitting: Cardiology

## 2019-06-28 ENCOUNTER — Telehealth: Payer: Self-pay | Admitting: Cardiovascular Disease

## 2019-06-28 NOTE — Telephone Encounter (Signed)
Follow up    Patient is returning call can be reached at [phone]

## 2019-06-28 NOTE — Telephone Encounter (Signed)
Patient had below completed 06/23/19. 1. Successful PCI of the SVG to OM with DES x 1 2. Successful PCI of the SVG to RCA with POBA.   Attempted to call patient to see if she has had any dizziness or symptoms that would limit her driving, but did not reach her. Left message that she will be called with driving instructions per Dr. Claiborne Billings.

## 2019-06-28 NOTE — Telephone Encounter (Signed)
OK TO DRIVE

## 2019-06-28 NOTE — Telephone Encounter (Signed)
Called patient, and advised of message from MD.  Patient verbalized understanding.  She has question regarding repatha and paying for it- advised she had upcoming appointment on 10/27 and the PHARMD could discuss then with her the options to pay.

## 2019-06-28 NOTE — Telephone Encounter (Signed)
New Message   Patient is calling to see when she can return to driving.

## 2019-07-03 ENCOUNTER — Telehealth: Payer: Self-pay | Admitting: Cardiovascular Disease

## 2019-07-03 NOTE — Telephone Encounter (Signed)
I wrote letter, it can be printed and gave to patient at appointment tomorrow. Thank you! Will route to cover nurse who will be helping him.

## 2019-07-03 NOTE — Telephone Encounter (Signed)
We do not carry these medications in as samples- I can send refills to her pharmacy;

## 2019-07-03 NOTE — Telephone Encounter (Signed)
Will be best to wait until she sees Dr.[name] to advise if dosages to stay the same- she has appointment tomorrow 10/27

## 2019-07-03 NOTE — Telephone Encounter (Signed)
  Patient calling the office for samples of medication:   1.  What medication and dosage are you requesting samples for? amLODipine (NORVASC) 5 MG tablet and nitroglycerin  2.  Are you currently out of this medication? Only has 3 left  Patient has appt tomorrow and can pick them up then

## 2019-07-03 NOTE — Telephone Encounter (Signed)
[place] for note with recent pci to 2 SVGs

## 2019-07-03 NOTE — Telephone Encounter (Signed)
Called patient, LVM- advised that a message would be sent to [facility] to advise if note is appropriate. Will notify patient once we get the okay from him.

## 2019-07-03 NOTE — Telephone Encounter (Signed)
  Patient is calling because she is being called to jury duty but she is unable to go due to health reasons. She would like to get a note that she can send to the court to excuse her.

## 2019-07-04 ENCOUNTER — Encounter: Payer: Self-pay | Admitting: Cardiovascular Disease

## 2019-07-04 ENCOUNTER — Ambulatory Visit: Payer: Medicare Other | Admitting: Cardiovascular Disease

## 2019-07-04 ENCOUNTER — Telehealth: Payer: Self-pay | Admitting: Cardiovascular Disease

## 2019-07-04 ENCOUNTER — Other Ambulatory Visit: Payer: Self-pay

## 2019-07-04 VITALS — BP 183/81 | HR 74 | Temp 97.0°F | Ht 64.0 in | Wt 186.6 lb

## 2019-07-04 DIAGNOSIS — Z789 Other specified health status: Secondary | ICD-10-CM

## 2019-07-04 DIAGNOSIS — I25118 Atherosclerotic heart disease of native coronary artery with other forms of angina pectoris: Secondary | ICD-10-CM

## 2019-07-04 DIAGNOSIS — E669 Obesity, unspecified: Secondary | ICD-10-CM

## 2019-07-04 DIAGNOSIS — I10 Essential (primary) hypertension: Secondary | ICD-10-CM | POA: Diagnosis not present

## 2019-07-04 DIAGNOSIS — E785 Hyperlipidemia, unspecified: Secondary | ICD-10-CM

## 2019-07-04 DIAGNOSIS — Z889 Allergy status to unspecified drugs, medicaments and biological substances status: Secondary | ICD-10-CM

## 2019-07-04 MED ORDER — NITROGLYCERIN 0.4 MG SL SUBL: 0.4000 mg | SUBLINGUAL_TABLET | SUBLINGUAL | 11 refills | Status: DC | PRN

## 2019-07-04 NOTE — Telephone Encounter (Signed)
Returned call to patient NTG refill sent to her pharmacy.

## 2019-07-04 NOTE — Patient Instructions (Signed)
Follow-Up: IN 4 months Please call our office 2 months in advance, DEC 2020 to schedule this FEB 2021 appointment. In Person You may see [name], MD or one of the following Advanced Practice Providers on your designated Care Team:  Almyra Deforest, PA-C  Fabian Sharp, PA-C or  [place], [place].    Medication Instructions:  The current medical regimen is effective;  continue present plan and medications as directed. Please refer to the Current Medication list given to you today. If you need a refill on your cardiac medications before your next appointment, please call your pharmacy.  At [facility], you and your health needs are our priority.  As part of our continuing mission to provide you with exceptional heart care, we have created designated Provider Care Teams.  These Care Teams include your primary Cardiologist (physician) and Advanced Practice Providers (APPs -  Physician Assistants and Nurse Practitioners) who all work together to provide you with the care you need, when you need it.  Thank you for choosing [facility] at [facility]!!

## 2019-07-04 NOTE — Telephone Encounter (Signed)
New message   Pt c/o medication issue:  1. Name of Medication: nitroGLYCERIN (NITROSTAT) 0.4 MG SL tablet  2. How are you currently taking this medication (dosage and times per day)?as needed   3. Are you having a reaction (difficulty breathing--STAT)? No   4. What is your medication issue? Patient needs a new prescription for this medication please send to [place], [place].

## 2019-07-04 NOTE — Progress Notes (Signed)
Patient ID: Ruth [name], female   DOB: August 11, 1946, 73 y.o.   MRN: [id number]   Primary M.D.: Dr. Crist Infante  HPI: Ruth [name] is a 73 year old female who presents for a follow-up cardiology evaluation following her recent hospitalization to 2 of her vein grafts.  Ruth [name] has a history of coronary artery disease.  While living in [place] she developed chest discomfort and in Oct 15, 2004 underwent CABG surgery 4 at the [facility] in [place] by Dr. Marzetta Merino.  She had a LIMA to LAD, SVG to diagonal, SVG to obtuse marginal, and SVG to the RCA.   Her husband died in 15-Oct-2010 and she moved to the [place] area.  In 2011-10-16, she experienced symptoms of increasing shortness of breath.  She underwent cardiac catheterization and a stent was placed in the vein graft to her RCA.  She states that she has not had any subsequent stress testing.  She is a former patient of Dr. Einar Gip and an echocardiogram in July 2014 showed normal LV size and function with mild concentric left ventricular hypertrophy.  She had mild to moderate mitral regurgitation, mild tricuspid regurgitation, and mild primary hypertension with an estimated PA pressure 39 mm.  She has a history of hypertension for at least 15-20 years.  She states her blood pressure at home typically is in the 145-150 range, but her blood pressure is always elevated when she goes to the doctor's office.  She has a history of hyperlipidemia and apparently has been intolerant to statins but has never tried Zetia.  There is a history of diabetes mellitus but is not on therapy and states this is diet controlled.    She admits to  increased stress.  Her mother died in Nov 13, 2014 and recently her dog died.  She does not routinely exercise.  When I initially saw her in June 2016 she has been taking amlodipine 5 mg, losartan 50 mg twice a day and metoprolol 50 mrem twice a day for hypertension and CAD.  She has a history of multiple allergies.   She states that she is Intolerant to amlodipine as well as Spironolactone.  She has been having labile blood pressure.  In the past. She became dehydrated on diarrhetic therapy.    An echo Doppler study on 03/13/2015  showed an ejection fraction at 55-60%.  There was mild mitral regurgitation, mild left atrial dilatation, and very mild pulmonary hypertension with an estimated PA pressure 32 mm.  A nuclear perfusion study done on 03/21/2015 was low risk and demonstrated a very small region of mild ischemia in the basal anterolateral wall.  She had normal LV function with an EF of 65%.  She developed recurrent episodes of chest pain in June 2018 which  led to her undergoing definitive cardiac catheterization on 03/01/2017.  She was found to have preserved global LV contractility with an EF of 55-60%.  There was significant multivessel CAD with 70% diffuse proximal LAD stenoses, total occlusion of the circumflex and OM1 proximally, and 75% proximal RCA stenosis with occlusion of the RCA prior to the acute margin.  She had a patent LIMA graft supplying the mid LAD, which may also anastomose into the diagonal vessel, but this was uncertain.  The vein graft supplying the distal marginal circumflex branch had smooth 60% proximal to mid body stenosis with TIMI-3 flow.  The vein graft supplying the distal RCA had focal 60% stenosis followed by 99% thrombotic stenosis beyond a stented segment.  She  underwent successful PCI to this vein graft to the RCA with insertion of a 3.532 mm Synergy stent postdilated to 3.71 mm with a Radiation protection practitioner used for distal graft protection.  It was recommended that she continue to antiplatelet therapy indefinitely.  Ruth [name] has significant allergies to multiple medications.  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  She has been on losartan 50 Milligan grams twice a day,  metoprolol 100 mg twice a day, and continues to take aspirin 81 mg and Effient 10 mg.  She did not tolerate Plavix or Brilinta.  She states that she is intolerant to statins.  She has no interest in PCSK9 inhibition.    Since I saw her in October 2018, she was seen in the office at least 5 times by extenders as well as pharmacists.  She is intolerant to numerous medications.  She last saw Nehemiah Massed, [facility] on December 02, 2017 who suggested the possibility of minoxidil but she continued to deny this medication change.  She states most recently her blood pressure at home has been ranging from 248-250 systolically and 03-70 diastolically.  She had called the office stating that she was having pain running through the area where she had a stent.  This was relieved with "water and vinegar which she drinks for gas.   When I saw her in May 2019, her blood pressure continued to be elevated in the office despite taking amlodipine 2.5 mg (she stated she could not tolerate 5 mg), metoprolol 100 mg twice a day, and she was taking valsartan 160 in the morning and only three quarters of a tablet in the evening.  With her blood pressure elevation I suggested further titration of valsartan to 160 twice daily and added Cardura 1 mg initially at night with plans to titrate to 2 mg.  I recommended follow-up hypertensive clinic evaluations with Joslyn Hy as well as Raquel Joellyn Haff who she has seen on numerous occasions.  He continues to admit to intolerance to numerous medications.  I last saw her in February 2020 at which time she was feeling significantly better.   She continued to have blood pressure elevation in the office and told me typically blood pressure at home was ranging around 488 systolically.  Ruth [name] was recently admitted to the hospital with recurrent abdominal pain which is her anginal equivalent.  I recommended repeat definitive cardiac catheterization since prior to her last  intervention she experienced the same symptoms which improved following intervention to her vein graft to RCA.  Diagnostic catheterization was done on June 22, 2019 by Dr. Tamala Julian and she again had developed restenosis within the stent in the vein graft to her RCA.  She also had new stenosis of 90% in the vein graft supplying her circumflex marginal vessel.  Her LIMA to LAD and diagonal remain patent.  She had an occluded native mid RCA and circumflex and high-grade 70% proximal LAD with 90% mid LAD stenosis with an occluded second diagonal.  After much discussion we ultimately recommended PCI and the following day she underwent successful intervention with stenting to the vein graft supplying her circumflex marginal vessel and PTCA for the in-stent restenosis in the vein graft supplying her RCA.  Subsequently, her symptoms have improved.  Of note, during her hospitalization on presentation she had marked hyperlipidemia highly suggestive of familial hyperlipidemia with total cholesterol at 300, LDL cholesterol 224 and  triglycerides 183.  In the past she has not been able to tolerate statins or Zetia.  I recommended initiation of PCSK9 therapy.  She presents for follow-up evaluation.  Past Medical History:  Diagnosis Date  . Anxiety   . CAD (coronary artery disease)    a. CABG x 4 in 2006 in [place] (LIMA->LAD, VG->Diag, VG->OM, VG->RCA); b. DES to midbody of SVG-PDA/PLA 07/2012; c. 03/2015 Myoview: EF 65%, small defect of mild severit in basal inferolateral wall, low risk.  . Complication of anesthesia    "quit breathing when I got ?[place]" (07/28/2012)  . Depression   . Diverticulosis   . Dyslipidemia    Intolerant of statins  . Fecal incontinence   . GERD (gastroesophageal reflux disease)   . Hyperlipidemia   . Hyperplastic colon polyp   . IBS (irritable bowel syndrome)   . Labile hypertension    a. 09/2016 Renal Artery duplex: nl renal arteries.  . Migraines    "had them in my 30's"  (07/28/2012)  . Multiple allergies   . Obesity   . Steatohepatitis   . Type II diabetes mellitus (HCC)    a. no meds, diet controlled - takes cinnamon.    Past Surgical History:  Procedure Laterality Date  . ABDOMINAL HYSTERECTOMY  1970's  . BREAST LUMPECTOMY     bilateral  . CARDIAC CATHETERIZATION  2006  . CORONARY ANGIOPLASTY WITH STENT PLACEMENT  07/28/2012   "1; first time for me" (07/28/2012)  . CORONARY ARTERY BYPASS GRAFT  2006   CABG X4  . CORONARY STENT INTERVENTION N/A 03/01/2017   Procedure: Coronary Stent Intervention;  Surgeon: Troy Sine, MD;  Location: [facility];  Service: Cardiovascular;  Laterality: N/A;  . CORONARY STENT INTERVENTION N/A 06/23/2019   Procedure: CORONARY STENT INTERVENTION;  Surgeon: Martinique, Peter M, MD;  Location: [facility];  Service: Cardiovascular;  Laterality: N/A;  . EXCISIONAL HEMORRHOIDECTOMY  1970's  . INGUINAL HERNIA REPAIR  1970's?   left  . LEFT HEART CATH AND CORS/GRAFTS ANGIOGRAPHY N/A 03/01/2017   Procedure: Left Heart Cath and Cors/Grafts Angiography;  Surgeon: Troy Sine, MD;  Location: [facility];  Service: Cardiovascular;  Laterality: N/A;  . LEFT HEART CATH AND CORS/GRAFTS ANGIOGRAPHY N/A 06/22/2019   Procedure: LEFT HEART CATH AND CORS/GRAFTS ANGIOGRAPHY;  Surgeon: Belva Crome, MD;  Location: [facility];  Service: Cardiovascular;  Laterality: N/A;  . LEFT HEART CATHETERIZATION WITH CORONARY/GRAFT ANGIOGRAM N/A 07/28/2012   Procedure: LEFT HEART CATHETERIZATION WITH Beatrix Fetters;  Surgeon: Burnell Blanks, MD;  Location: [facility] C[facility] LAB;  Service: Cardiovascular;  Laterality: N/A;  . PERCUTANEOUS CORONARY STENT INTERVENTION (PCI-S)  07/28/2012   Procedure: PERCUTANEOUS CORONARY STENT INTERVENTION (PCI-S);  Surgeon: Burnell Blanks, MD;  Location: [facility] CATH LAB;  Service: Cardiovascular;;  . TONSILLECTOMY AND ADENOIDECTOMY  ~ 1951    Allergies  Allergen Reactions   . Betadine [Povidone Iodine] Shortness Of Breath and Swelling  . Codeine Anaphylaxis and Shortness Of Breath    "quit breathing" (07/28/2012) Tolerates 1-2 shots of morphine  . Demerol Anaphylaxis    "quit breathing" (07/28/2012)  . Iohexol Anaphylaxis    Finger/ankle swelling  . Latex Anaphylaxis    "quit breathing" (07/28/2012)  . Other Anaphylaxis    Perfume, Any Fragrance. Cleaning Fluids.  "quit breathing" (07/28/2012)  . Percocet [Oxycodone-Acetaminophen] Anaphylaxis    "quit breathing; disoriented" (07/28/2012)  . Plavix [Clopidogrel Bisulfate] Anaphylaxis    "get hot; like I'm burning  up inside; had to put me in shower after OHS because of that" (07/28/2012)  . Red Dye Anaphylaxis    "quit breathing" (07/28/2012)  . Shellfish Allergy Shortness Of Breath    "broke out in knots all over" (07/28/2012)  . Sulfonamide Derivatives Shortness Of Breath and Rash    "quit breathing" (07/28/2012)  . Tylenol [Acetaminophen] Shortness Of Breath and Itching  . Pravastatin Sodium Other (See Comments)    Leg pain, problems ambulating   . Statins     Muscle pain  . Imdur [Isosorbide Nitrate] Other (See Comments)    Intolerance. "Can't remember the reaction."    Current Outpatient Medications  Medication Sig Dispense Refill  . amLODipine (NORVASC) 5 MG tablet Take 1 tablet (5 mg total) by mouth daily. (Patient taking differently: Take 2.5 mg by mouth daily. ) 90 tablet 1  . aspirin EC 81 MG tablet Take 81 mg by mouth daily.    . calcium carbonate (TUMS - DOSED IN MG ELEMENTAL CALCIUM) 500 MG chewable tablet Chew 1-2 tablets by mouth 3 (three) times daily as needed for heartburn.     . cholecalciferol (VITAMIN D) 1000 UNITS tablet Take 1,000 Units by mouth daily.    Marland Kitchen CRANBERRY-VITAMIN C PO Take 1 tablet by mouth daily.    Marland Kitchen dicyclomine (BENTYL) 10 MG capsule Take 1 capsule (10 mg total) by mouth 2 (two) times daily as needed for spasms. 30 capsule 0  . Insulin Glargine (TOUJEO MAX  SOLOSTAR [place]) Inject 50 Units into the skin daily.     . Magnesium 250 MG TABS Take 250 mg by mouth daily.    . metoprolol tartrate (LOPRESSOR) 100 MG tablet TAKE 1 TABLET BY MOUTH TWICE A DAY (Patient taking differently: Take 100 mg by mouth 2 (two) times daily. ) 180 tablet 2  . prasugrel (EFFIENT) 10 MG TABS tablet TAKE 1 TABLET(10 MG) BY MOUTH DAILY (Patient taking differently: Take 10 mg by mouth daily. ) 90 tablet 0  . Simethicone (GAS RELIEF 80 PO) Take 80 mg by mouth 3 (three) times daily as needed (gas).    . valsartan (DIOVAN) 160 MG tablet TAKE 1 TABLET(160 MG) BY MOUTH TWICE DAILY (Patient taking differently: Take 160 mg by mouth 2 (two) times daily. ) 180 tablet 3  . vitamin B-12 (CYANOCOBALAMIN) 1000 MCG tablet Take 1,000 mcg by mouth daily.    . nitroGLYCERIN (NITROSTAT) 0.4 MG SL tablet Place 1 tablet (0.4 mg total) under the tongue every 5 (five) minutes as needed for chest pain (up to 3 doses). 25 tablet 11   No current facility-administered medications for this visit.     Social History   Socioeconomic History  . Marital status: Widowed    Spouse name: Not on file  . Number of children: 2  . Years of education: Not on file  . Highest education level: Not on file  Occupational History  . Occupation: retired  Scientific laboratory technician  . Financial resource strain: Not on file  . Food insecurity    Worry: Not on file    Inability: Not on file  . Transportation needs    Medical: Not on file    Non-medical: Not on file  Tobacco Use  . Smoking status: Never Smoker  . Smokeless tobacco: Never Used  Substance and Sexual Activity  . Alcohol use: No    Alcohol/week: 0.0 standard drinks  . Drug use: No  . Sexual activity: Never  Lifestyle  . Physical activity  Days per week: Not on file    Minutes per session: Not on file  . Stress: Not on file  Relationships  . Social Herbalist on phone: Not on file    Gets together: Not on file    Attends religious service: Not  on file    Active member of club or organization: Not on file    Attends meetings of clubs or organizations: Not on file    Relationship status: Not on file  . Intimate partner violence    Fear of current or ex partner: Not on file    Emotionally abused: Not on file    Physically abused: Not on file    Forced sexual activity: Not on file  Other Topics Concern  . Not on file  Social History Narrative  . Not on file   Social history is notable that she is widowed.  She has 2 children, ages 24 and 48.  There is no tobacco use.  She does not routinely exercise. She has a son who had undergone CABG surgery and reportedly has a defibrillator and an ejection fraction of 35%.  Family History  Problem Relation Age of Onset  . Coronary artery disease Father   . Colon cancer Father   . Colon polyps Father   . Heart disease Father   . Stroke Mother   . Hypertension Other   . Breast cancer Other        grandmother  . Diabetes Maternal Grandmother   . Diabetes Paternal Grandmother   . Esophageal cancer Neg Hx   . Rectal cancer Neg Hx   . Stomach cancer Neg Hx    Family history is notable in that her father has heart disease and is 5 years old.  Her mother died at age 56 with a stroke.  She has 2 sisters, ages 50 and 33.  ROS General: Negative; No fevers, chills, or night sweats HEENT: Negative; No changes in vision or hearing, sinus congestion, difficulty swallowing Pulmonary: Negative; No cough, wheezing, shortness of breath, hemoptysis Cardiovascular: See HPI:  GI: Positive for left upper quadrant pain which he describes as "gas."  She has issues with diverticulitis intermittently. GU: Negative; No dysuria, hematuria, or difficulty voiding Musculoskeletal: Negative; no myalgias, joint pain, or weakness Hematologic: Negative; no easy bruising, bleeding Endocrine: Positive for diabetes mellitus , which is untreated.  She has refused medications Neuro: Negative; no changes in balance,  headaches Skin: Negative; No rashes or skin lesions Psychiatric: Negative; No behavioral problems, depression Sleep: Negative; No snoring,  daytime sleepiness, hypersomnolence, bruxism, restless legs, hypnogognic hallucinations. Other comprehensive 14 point system review is negative   Physical Exam BP (!) 183/81   Pulse 74   Temp (!) 97 F (36.1 C)   Ht 5' 4"  (1.626 m)   Wt 186 lb 9.6 oz (84.6 kg)   SpO2 94%   BMI 32.03 kg/m    Repeat blood pressure by me today was improved at 142/84 but is still elevated.  Wt Readings from Last 3 Encounters:  07/04/19 186 lb 9.6 oz (84.6 kg)  06/24/19 183 lb 4.8 oz (83.1 kg)  05/12/19 185 lb (83.9 kg)     Physical Exam BP (!) 183/81   Pulse 74   Temp (!) 97 F (36.1 C)   Ht 5' 4"  (1.626 m)   Wt 186 lb 9.6 oz (84.6 kg)   SpO2 94%   BMI 32.03 kg/m  General: Alert, oriented, no distress.  Skin:  normal turgor, no rashes, warm and dry HEENT: Normocephalic, atraumatic. Pupils equal round and reactive to light; sclera anicteric; extraocular muscles intact;  Nose without nasal septal hypertrophy Mouth/Parynx benign; Mallinpatti scale 3 Neck: No JVD, no carotid bruits; normal carotid upstroke Lungs: clear to ausculatation and percussion; no wheezing or rales Chest wall: without tenderness to palpitation Heart: PMI not displaced, RRR, s1 s2 normal, 1/6 systolic murmur, no diastolic murmur, no rubs, gallops, thrills, or heaves Abdomen: soft, nontender; no hepatosplenomehaly, BS+; abdominal aorta nontender and not dilated by palpation. Back: no CVA tenderness Pulses 2+ Musculoskeletal: full range of motion, normal strength, no joint deformities Extremities: no clubbing cyanosis or edema, Homan's sign negative  Neurologic: grossly nonfocal; Cranial nerves grossly wnl Psychologic: Normal mood and affect   ECG (independently read by me): Normal sinus rhythm 64 bpm.  Nonspecific ST changes.  Collaterals  February 2020 ECG (independently read  by me): NSR ay 68; nonspecific lateral T wave abnormality.  Normal intervals.  No ectopy.  May 2019 ECG (independently read by me): Normal sinus rhythm at 66 bpm.  Lateral T wave abnormality in leads I and aVL.  Normal intervals.  October 2018 ECG (independently read by me): Normal sinus rhythm at 66 bpm.  Nondiagnostic T change laterally.  Normal intervals.  No ectopy.  April 2018 ECG (independently read by me): Normal sinus rhythm at 67 bpm.  PR interval 142 Ruth.  Poor anterior R-wave progression.  Lateral T changes in 1 and L.  December 2017 ECG (independently read by me): Normal sinus rhythm at 70 bpm.  Nonspecific ST changes.  No ectopy.  March 2017 ECG (independently read by me):  Normal sinus rhythm at 70 bpm. Nonspecific ST changes laterally.  03/04/2015 ECG (independently read by me): Normal sinus rhythm at 65 bpm.  Mild T wave abnormality in lead 1 and aVL.  LABS:  BMP Latest Ref Rng & Units 06/24/2019 06/23/2019 06/22/2019  Glucose 70 - 99 mg/dL 238(H) 229(H) 150(H)  BUN 8 - 23 mg/dL _0 Creatinine 0.44 - 1.00 mg/dL 0.70 0.84 0.72  BUN/Creat Ratio 12 - 28 - - -  Sodium 135 - 145 mmol/L 139 137 137  Potassium 3.5 - 5.1 mmol/L 3.9 3.9 3.9  Chloride 98 - 111 mmol/L 106 102 101  CO2 22 - 32 mmol/L _1 Calcium 8.9 - 10.3 mg/dL 9.2 9.4 9.1     Hepatic Function Latest Ref Rng & Units 06/21/2019 12/14/2016 03/19/2015  Total Protein 6.5 - 8.1 g/dL 7.9 6.6 6.9  Albumin 3.5 - 5.0 g/dL 4.1 3.9 4.1  AST 15 - 41 U/L 33 12 20  ALT 0 - 44 U/L _2 Alk Phosphatase 38 - 126 U/L 76 63 64  Total Bilirubin 0.3 - 1.2 mg/dL 1.7(H) 0.6 0.7  Bilirubin, Direct 0.0 - 0.2 mg/dL 0.5(H) - -    CBC Latest Ref Rng & Units 06/24/2019 06/23/2019 06/22/2019  WBC 4.0 - 10.5 K/uL 11.5(H) 9.5 7.9  Hemoglobin 12.0 - 15.0 g/dL 13.4 14.7 14.9  Hematocrit 36.0 - 46.0 % 40.7 44.3 43.8  Platelets 150 - 400 K/uL 263 291 258   Lab Results  Component Value Date   MCV 92.5 06/24/2019   MCV  92.1 06/23/2019   MCV 92.2 06/22/2019    Lab Results  Component Value Date   TSH 1.987 03/19/2015    BNP No results found for: BNP  ProBNP No results found for: PROBNP   Lipid Panel  Component Value Date/Time   CHOL 300 (H) 06/21/2019 1855   TRIG 183 (H) 06/21/2019 1855   HDL 39 (L) 06/21/2019 1855   CHOLHDL 7.7 06/21/2019 1855   VLDL 37 06/21/2019 1855   LDLCALC 224 (H) 06/21/2019 1855   LDLDIRECT 160.9 06/21/2012 0833     RADIOLOGY: Dg Chest 2 View  Result Date: 06/21/2019 CLINICAL DATA:  Chest pain EXAM: CHEST - 2 VIEW COMPARISON:  July 07, 2012 FINDINGS: There is mild scarring in the bases. There is no edema or consolidation. Heart is upper normal in size with pulmonary vascularity normal. Patient is status post coronary artery bypass grafting. There is aortic atherosclerosis. No adenopathy. No bone lesions. IMPRESSION: Mild scarring in the bases. No edema or consolidation. Stable cardiac silhouette. Postoperative changes noted. Aortic Atherosclerosis (ICD10-I70.0). Electronically Signed   By: Lowella Grip III M.D.   On: 06/21/2019 10:25   US Abdomen Complete  Result Date: 06/21/2019 CLINICAL DATA:  Abdominal pain. EXAM: ABDOMEN ULTRASOUND COMPLETE COMPARISON:  None. FINDINGS: Gallbladder: No gallstones or wall thickening visualized. No sonographic Murphy sign noted by sonographer. Common bile duct: Diameter: 4 mm Liver: Increased parenchymal echogenicity. No mass or focal lesion. Portal vein is patent on color Doppler imaging with normal direction of blood flow towards the liver. IVC: No abnormality visualized. Pancreas: Visualized portion unremarkable. Spleen: Size and appearance within normal limits. Right Kidney: Length: 11.5 cm. Echogenicity within normal limits. No mass or hydronephrosis visualized. Left Kidney: Length: 11.9 cm. Echogenicity within normal limits. No mass or hydronephrosis visualized. Abdominal aorta: No aneurysm.  Atherosclerotic  irregularity. Other findings: None. IMPRESSION: 1. No acute findings.  No findings to account for abdominal pain. 2. Increased liver parenchymal echogenicity consistent with hepatic steatosis. Aortic atherosclerosis. No other abnormalities. Electronically Signed   By: Lajean Manes M.D.   On: 06/21/2019 20:41    IMPRESSION:  1. Coronary artery disease involving native coronary artery of native heart with other form of angina pectoris ([place])   2. Essential hypertension   3. Hyperlipidemia with target LDL less than 70   4. Statin intolerance   5. Drug allergy, multiple   6. Obesity (BMI 30-39.9)     ASSESSMENT AND PLAN: Ruth [name] is a 73 year old female who is 14 years status post CABG revascularization surgery 4, which was done at the [facility] in [place], [place] in 2006.Marland Kitchen  She is status post remote DES stenting to the vein graft supplying her RCA territory.   She developed recurrent anginal symptomatology leading to repeat cardiac catheterization on 03/01/2017. 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  She had as well as weight loss.  She will follow-up with [place] our Pharm.D. in 4-6 weeks and hopefully she will be approved for Praluent.  I will see her in 4 months for reevaluation   Time spent: 25 minutes Troy Sine, MD, [facility]  07/06/2019 12:14 PM

## 2019-07-06 ENCOUNTER — Encounter: Payer: Self-pay | Admitting: Cardiovascular Disease

## 2019-07-06 NOTE — Telephone Encounter (Signed)
GIVEN TO PT AT APPT

## 2019-07-07 NOTE — Discharge Summary (Signed)
Physician Discharge Summary  Ruth [name] UVO:[id number] DOB: [birth year] DOA: 06/21/2019  PCP: Crist Infante, MD  Admit date: 06/21/2019 Discharge date: 06/24/2019  Time spent: 59mnutes  Recommendations for Outpatient Follow-up:  Cardiology Dr. KClaiborne Billingsin 2 weeks  Discharge Diagnoses:  Principal Problem:   Unstable angina ([facility] Active Problems:   Coronary artery disease   Hyperlipidemia with target LDL less than 70   Diabetes mellitus ([facility]   Obesity (BMI 30-39.9)   HTN (hypertension)   Chest pain   IBS (irritable bowel syndrome)   Hyperbilirubinemia   GERD (gastroesophageal reflux disease)   Non-ST elevation (NSTEMI) myocardial infarction ([facility]   Discharge Condition: Stable  Diet recommendation: Diabetic, heart healthy  Filed Weights   06/23/19 1000 06/24/19 0505  Weight: 83.9 kg 83.1 kg    History of present illness:  73year old female with history of CAD, CABG in 2006, type 2 diabetes mellitus hypertension dyslipidemia and irritable bowel syndrome presented to the emergency room on 10/14 with lower chest upper abdominal pain, found to have unstable angina.   Hospital Course:   1. Unstable angina, CAD -Per cardiology, underwent left heart catheterization 10/15 which showed multivessel disease, patient declined redo CABG, following procedure she had recurrent chest pain with worsening EKG changes, subsequently the following day on 1016 went back to the Cath Lab and underwent two-vessel intervention, successful PCI of SVG to OM with DES x1 and successful angioplasty of SVG to RCA -Remained stable the following day, stayed on aspirin prasugrel, beta-blocker and statin -Discharged home in a stable condition today, -Follow-up with Dr. KClaiborne Billingsin 2 weeks  2.  Type 2 diabetes mellitus uncontrolled with hyperglycemia -CBGs stable, continue Levemir will increase dose to 55 units  3.  IBS with chronic diarrhea -Supportive care   PCI 06/23/2019:  SVG.  Prox Graft  to Mid Graft lesion is 90% stenosed.  A drug-eluting stent was successfully placed using a STENT SYNERGY DES 3.5X32.  Post intervention, there is a 0% residual stenosis.  SVG.  Dist Graft lesion is 95% stenosed.  Balloon angioplasty was performed using a BALLOON SAPPHIRE [place] 3.5X15.  Post intervention, there is a 0% residual stenosis.  1. Successful PCI of the SVG to OM with DES x 1 2. Successful PCI of the SVG to RCA with POBA.   Plan: DAPT for at least one year and I would consider indefinitely. Anticipate DC in am if stable.   Cardiac catheterization 06/22/2019:  Bypass graft failure with high-grade obstruction and SVG to RCA (second restenosis.  90% saphenous vein graft to the obtuse marginal.  Patent sequential LIMA to diagonal and LAD.  Occluded mid right coronary  Occluded circumflex  70% proximal LAD and 90% mid LAD. Occluded second diagonal.  Normal LV function with elevated LVEDP. Estimated ejection fraction   Discharge Exam: Vitals:   06/24/19 0738 06/24/19 1132  BP: (!) 204/107 (!) 141/73  Pulse: 75 68  Resp: 17 17  Temp: 98.2 F (36.8 C) 98 F (36.7 C)  SpO2: 96% 97%    General: AAOx3 Cardiovascular: S1s2/RRR Respiratory: CTAB  Discharge Instructions   Discharge Instructions    Amb Referral to Cardiac Rehabilitation   Complete by: As directed    Diagnosis:  Coronary Stents PTCA     After initial evaluation and assessments completed: Virtual Based Care may be provided alone or in conjunction with Phase 2 Cardiac Rehab based on patient barriers.: Yes   Diet - low sodium heart healthy   Complete by: As directed  Diet Carb Modified   Complete by: As directed    Increase activity slowly   Complete by: As directed      Allergies as of 06/24/2019      Reactions   Betadine [povidone Iodine] Shortness Of Breath, Swelling   Codeine Anaphylaxis, Shortness Of Breath   "quit breathing" (07/28/2012) Tolerates 1-2 shots of morphine    Demerol Anaphylaxis   "quit breathing" (07/28/2012)   Iohexol Anaphylaxis   Finger/ankle swelling   Latex Anaphylaxis   "quit breathing" (07/28/2012)   Other Anaphylaxis   Perfume, Any Fragrance. Cleaning Fluids.  "quit breathing" (07/28/2012)   Percocet [oxycodone-acetaminophen] Anaphylaxis   "quit breathing; disoriented" (07/28/2012)   Plavix [clopidogrel Bisulfate] Anaphylaxis   "get hot; like I'm burning up inside; had to put me in shower after OHS because of that" (07/28/2012)   Red Dye Anaphylaxis   "quit breathing" (07/28/2012)   Shellfish Allergy Shortness Of Breath   "broke out in knots all over" (07/28/2012)   Sulfonamide Derivatives Shortness Of Breath, Rash   "quit breathing" (07/28/2012)   Tylenol [acetaminophen] Shortness Of Breath, Itching   Pravastatin Sodium Other (See Comments)   Leg pain, problems ambulating    Statins    Muscle pain   Imdur [isosorbide Nitrate] Other (See Comments)   Intolerance. "Can't remember the reaction."      Medication List    TAKE these medications   amLODipine 5 MG tablet Commonly known as: NORVASC Take 1 tablet (5 mg total) by mouth daily. What changed: how much to take   aspirin EC 81 MG tablet Take 81 mg by mouth daily.   calcium carbonate 500 MG chewable tablet Commonly known as: TUMS - dosed in mg elemental calcium Chew 1-2 tablets by mouth 3 (three) times daily as needed for heartburn.   cholecalciferol 1000 units tablet Commonly known as: VITAMIN D Take 1,000 Units by mouth daily.   CRANBERRY-VITAMIN C PO Take 1 tablet by mouth daily.   dicyclomine 10 MG capsule Commonly known as: BENTYL Take 1 capsule (10 mg total) by mouth 2 (two) times daily as needed for spasms.   GAS RELIEF 80 PO Take 80 mg by mouth 3 (three) times daily as needed (gas).   Magnesium 250 MG Tabs Take 250 mg by mouth daily.   metoprolol tartrate 100 MG tablet Commonly known as: LOPRESSOR TAKE 1 TABLET BY MOUTH TWICE A DAY What  changed:   how much to take  how to take this  when to take this  additional instructions   prasugrel 10 MG Tabs tablet Commonly known as: EFFIENT TAKE 1 TABLET(10 MG) BY MOUTH DAILY What changed: See the new instructions.   TOUJEO MAX SOLOSTAR [place] Inject 50 Units into the skin daily.   valsartan 160 MG tablet Commonly known as: DIOVAN TAKE 1 TABLET(160 MG) BY MOUTH TWICE DAILY What changed: See the new instructions.   vitamin B-12 1000 MCG tablet Commonly known as: CYANOCOBALAMIN Take 1,000 mcg by mouth daily.      Allergies  Allergen Reactions  . Betadine [Povidone Iodine] Shortness Of Breath and Swelling  . Codeine Anaphylaxis and Shortness Of Breath    "quit breathing" (07/28/2012) Tolerates 1-2 shots of morphine  . Demerol Anaphylaxis    "quit breathing" (07/28/2012)  . Iohexol Anaphylaxis    Finger/ankle swelling  . Latex Anaphylaxis    "quit breathing" (07/28/2012)  . Other Anaphylaxis    Perfume, Any Fragrance. Cleaning Fluids.  "quit breathing" (07/28/2012)  . Percocet [Oxycodone-Acetaminophen] Anaphylaxis    "  quit breathing; disoriented" (07/28/2012)  . Plavix [Clopidogrel Bisulfate] Anaphylaxis    "get hot; like I'm burning up inside; had to put me in shower after OHS because of that" (07/28/2012)  . Red Dye Anaphylaxis    "quit breathing" (07/28/2012)  . Shellfish Allergy Shortness Of Breath    "broke out in knots all over" (07/28/2012)  . Sulfonamide Derivatives Shortness Of Breath and Rash    "quit breathing" (07/28/2012)  . Tylenol [Acetaminophen] Shortness Of Breath and Itching  . Pravastatin Sodium Other (See Comments)    Leg pain, problems ambulating   . Statins     Muscle pain  . Imdur [Isosorbide Nitrate] Other (See Comments)    Intolerance. "Can't remember the reaction."   Follow-up Information    Crist Infante, MD. Schedule an appointment as soon as possible for a visit in 1 week(s).   Specialty: Internal Medicine Contact  information: [place] [address] [phone]        Troy Sine, MD. Schedule an appointment as soon as possible for a visit in 2 week(s).   Specialty: Cardiology Contact information: [address] [place] [address] [phone]            The results of significant diagnostics from this hospitalization (including imaging, microbiology, ancillary and laboratory) are listed below for reference.    Significant Diagnostic Studies: Dg Chest 2 View  Result Date: 06/21/2019 CLINICAL DATA:  Chest pain EXAM: CHEST - 2 VIEW COMPARISON:  July 07, 2012 FINDINGS: There is mild scarring in the bases. There is no edema or consolidation. Heart is upper normal in size with pulmonary vascularity normal. Patient is status post coronary artery bypass grafting. There is aortic atherosclerosis. No adenopathy. No bone lesions. IMPRESSION: Mild scarring in the bases. No edema or consolidation. Stable cardiac silhouette. Postoperative changes noted. Aortic Atherosclerosis (ICD10-I70.0). Electronically Signed   By: Lowella Grip III M.D.   On: 06/21/2019 10:25   US Abdomen Complete  Result Date: 06/21/2019 CLINICAL DATA:  Abdominal pain. EXAM: ABDOMEN ULTRASOUND COMPLETE COMPARISON:  None. FINDINGS: Gallbladder: No gallstones or wall thickening visualized. No sonographic Murphy sign noted by sonographer. Common bile duct: Diameter: 4 mm Liver: Increased parenchymal echogenicity. No mass or focal lesion. Portal vein is patent on color Doppler imaging with normal direction of blood flow towards the liver. IVC: No abnormality visualized. Pancreas: Visualized portion unremarkable. Spleen: Size and appearance within normal limits. Right Kidney: Length: 11.5 cm. Echogenicity within normal limits. No mass or hydronephrosis visualized. Left Kidney: Length: 11.9 cm. Echogenicity within normal limits. No mass or hydronephrosis visualized. Abdominal aorta: No aneurysm.   Atherosclerotic irregularity. Other findings: None. IMPRESSION: 1. No acute findings.  No findings to account for abdominal pain. 2. Increased liver parenchymal echogenicity consistent with hepatic steatosis. Aortic atherosclerosis. No other abnormalities. Electronically Signed   By: Lajean Manes M.D.   On: 06/21/2019 20:41    Microbiology: No results found for this or any previous visit (from the past 240 hour(s)).   Labs: Basic Metabolic Panel: No results for input(s): NA, K, CL, CO2, GLUCOSE, BUN, CREATININE, CALCIUM, MG, PHOS in the last 168 hours. Liver Function Tests: No results for input(s): AST, ALT, ALKPHOS, BILITOT, PROT, ALBUMIN in the last 168 hours. No results for input(s): LIPASE, AMYLASE in the last 168 hours. No results for input(s): AMMONIA in the last 168 hours. CBC: No results for input(s): WBC, NEUTROABS, HGB, HCT, MCV, PLT in the last 168 hours. Cardiac Enzymes: No results  for input(s): CKTOTAL, CKMB, CKMBINDEX, TROPONINI in the last 168 hours. BNP: BNP (last 3 results) No results for input(s): BNP in the last 8760 hours.  ProBNP (last 3 results) No results for input(s): PROBNP in the last 8760 hours.  CBG: No results for input(s): GLUCAP in the last 168 hours.     Signed:  Domenic Polite MD.  [facility] 07/07/2019, 2:51 PM

## 2019-07-10 ENCOUNTER — Telehealth (HOSPITAL_COMMUNITY): Payer: Self-pay

## 2019-07-10 NOTE — Telephone Encounter (Signed)
Called and spoke with pt in regards to CR, pt stated she is not able to participate at this time due to health reasons.   Closed referral

## 2019-07-11 ENCOUNTER — Telehealth: Payer: Self-pay | Admitting: Pharmacist

## 2019-07-11 NOTE — Telephone Encounter (Signed)
Patient tolerated Praluent 84m sample given 10/27  Lot: [id number]: 30-12-2019  PA for praluent approved as well. Will proceed with PASS form and follow up in 4 weeks with HTN and lipid clinic.

## 2019-07-14 ENCOUNTER — Telehealth: Payer: Self-pay | Admitting: Cardiovascular Disease

## 2019-07-14 NOTE — Telephone Encounter (Signed)
Patient called wanting to speak to Raquel, she states she will be on Monday to pick up her medication.

## 2019-07-14 NOTE — Telephone Encounter (Signed)
Sample of Praleunt 70m in the refrigerator.   Patient received update from me today

## 2019-07-25 ENCOUNTER — Telehealth: Payer: Self-pay | Admitting: Cardiovascular Disease

## 2019-07-25 NOTE — Telephone Encounter (Signed)
Patient Assistance Support Program (PASS) needs a new rx for the patient's Praulent injection sent to them. The initial rx that was sent to the program was not dated. A new rx can be faxed to : [phone]

## 2019-07-25 NOTE — Telephone Encounter (Signed)
Will forward to Pharm D

## 2019-07-25 NOTE — Telephone Encounter (Signed)
Had Praluent 33m on 11/16 and doing well.  Waiting for answer from PASS approval.  Medication Samples have been provided to the patient.  Drug name: PRALUENT       Strength: 75MG        Qty: 2 LOT:: [id number] Exp.Date: 30-12-2019  Fatemah Pourciau Rodriguez-Guzman PharmD, BCPS, CSpreckels[address] [id number]/17/2020 4:23 PM

## 2019-07-25 NOTE — Telephone Encounter (Signed)
Patient wanted to talk to Raquel about her cholesterol shot and some side effects from doing it for the first time. The patient did not give any more information , but simply wanted to speak with Raquel

## 2019-07-26 ENCOUNTER — Telehealth: Payer: Self-pay

## 2019-07-26 MED ORDER — PRALUENT 75 MG/ML ~~LOC~~ SOAJ: 150.0000 mg | SUBCUTANEOUS | 11 refills | Status: DC

## 2019-07-26 MED ORDER — PRALUENT 75 MG/ML ~~LOC~~ SOAJ: 75.0000 mg | SUBCUTANEOUS | 11 refills | Status: DC

## 2019-07-26 NOTE — Telephone Encounter (Signed)
Called and spoke w/pt regarding the grant for $2500 thru hw. The cost at the pharmacy will be $122 the pt didn't understand and prefers to speak w/raquel I will route to her.

## 2019-07-27 ENCOUNTER — Telehealth: Payer: Self-pay | Admitting: Pharmacist

## 2019-07-27 NOTE — Telephone Encounter (Signed)
Got the pt approved for hw and pt notified w/$2500 grant rx sent to pharmacy.

## 2019-07-27 NOTE — Telephone Encounter (Signed)
Duplicate - see telephone note from today 07/27/2019

## 2019-07-27 NOTE — Telephone Encounter (Signed)
Samples are in the refrigerator since Tuesday.  LMOM; her co-pay for Praleunt is $122 per months but she is approved for [employer] grant that will provide $2500 to cover co-pay for next 20 months.   Patient to call back for additonal question.

## 2019-07-27 NOTE — Telephone Encounter (Signed)
Patient called and wanted to make sure the medication Raquel was holding for her is ready to be picked up. She will be at the office within the next hour. She will also have some questions about the medicine, and asks that Raquel call her after 1:00 when she will be back home

## 2019-07-31 ENCOUNTER — Telehealth: Payer: Self-pay | Admitting: Cardiovascular Disease

## 2019-07-31 MED ORDER — AMLODIPINE BESYLATE 5 MG PO TABS: 5.0000 mg | ORAL_TABLET | Freq: Every day | ORAL | 3 refills | Status: DC

## 2019-07-31 NOTE — Telephone Encounter (Signed)
If blood pressure continues to be elevated, increase amlodipine to 7.5 mg daily.  To the center center

## 2019-07-31 NOTE — Telephone Encounter (Signed)
Spoke with pt, aware of dr [facility] recommendations. New script sent to the pharmacy.

## 2019-07-31 NOTE — Telephone Encounter (Signed)
Pt c/o BP issue: STAT if pt c/o blurred vision, one-sided weakness or slurred speech  1. What are your last 5 BP readings? 210/100 last night      188/86       200/85        171/68 this morning (after she took an extra 2.5 mg)   2. Are you having any other symptoms (ex. Dizziness, headache, blurred vision, passed out)? When it was 210/100 she was dizzy. She states she felt weird.   3. What is your BP issue? She wants to know if she can increase her medication. She took amLODipine 2.10m at 5am and then again at 9am to took another 2.528m

## 2019-07-31 NOTE — Telephone Encounter (Signed)
Spoke with pt, she reports her bp is occasionally elevated at 159/72. She is not sure why it got so high last night, she reports not eating anything bad. She has increased her amlodipine to 5 mg and this morning it is down to 162/68. She wants to know what else she needs to do. Will forward to dr Claiborne Billings to review and advise, she wants to remind him she has chemical allergies.

## 2019-08-01 ENCOUNTER — Ambulatory Visit: Payer: Medicare Other

## 2019-08-07 ENCOUNTER — Telehealth: Payer: Self-pay | Admitting: Cardiovascular Disease

## 2019-08-07 NOTE — Telephone Encounter (Signed)
New message:     [employer] from [employer] calling stating they need a signature and a certification letter faxed [phone] if any question call [phone].

## 2019-08-07 NOTE — Telephone Encounter (Signed)
Praluent medication. Thanks!

## 2019-08-07 NOTE — Telephone Encounter (Signed)
Pt has already been approved with [employer] so pass is no longer needed

## 2019-08-18 ENCOUNTER — Telehealth: Payer: Self-pay | Admitting: Cardiovascular Disease

## 2019-08-18 NOTE — Telephone Encounter (Signed)
New Message     Pt says she is having pain in her stomach and her sides. She says its like gas and she has been using Tums but is getting no relief. It is getting worse     Please call

## 2019-08-18 NOTE — Telephone Encounter (Signed)
Spoke with pt, she reports a sharp, hard pain in the epigastric area, below her ribs and in her back that she feels is gas. She has been belching and took gas-x and has gotten some relief but not a lot. She reports when she had her previous heart attack she had the same thing but the discomfort went up into her chest. Currently the pain is not in her chest. Her bp 198/82. She will try walking around to see if she can get the gas to move. Patient voiced understanding to go to the hospital if the discomfort changes or gets worse. She requested an appointment as she is concerned because it is similar to previous. Follow up scheduled Monday 08/21/2019.

## 2019-08-20 NOTE — Progress Notes (Signed)
Cardiology Office Note   Date:  08/21/2019   ID:  Ruth [name], DOB May 29, 1946, MRN [id number]  PCP:  Crist Infante, MD  Cardiologist:  Claiborne Billings  YI:[id number] abdominal pain   History of Present Illness: Ruth [name] is a 73 y.o. female who presents for ongoing assessment and management of coronary artery disease.  History of CABG x4 while living in [place] in 2006.  This was completed at [facility] in [place] by Dr. Marzetta Merino (LIMA to LAD, SVG to diagonal, SVG to obtuse marginal, and SVG to RCA). In  2013 she had recurrent symptoms of increasing shortness of breath and had catheterization with a stent placed in the vein graft to her RCA.  Other history includes hypertension (intolerant to amlodipine and spironolactone), hypercholesterolemia but is intolerant to statins.  Diet controlled diabetes  She had a repeat cardiac catheterization June 2018, which revealed preserved LV contractility with an EF of 55 to 60%.There was significant multivessel CAD with 70% diffuse proximal LAD stenoses, total occlusion of the circumflex and OM1 proximally, and 75% proximal RCA stenosis with occlusion of the RCA prior to the acute margin.  She had a patent LIMA graft supplying the mid LAD, which may also anastomose into the diagonal vessel, but this was uncertain.  The vein graft supplying the distal marginal circumflex branch had smooth 60% proximal to mid body stenosis with TIMI-3 flow.  The vein graft supplying the distal RCA had focal 60% stenosis followed by 99% thrombotic stenosis beyond a stented segment.  She underwent successful PCI to this vein graft to the RCA with insertion of a 3.532 mm Synergy stent postdilated to 3.71 mm with a Radiation protection practitioner used for distal graft protection.  It was recommended that she continue to antiplatelet therapy.   Dr. Claiborne Billings has noted that she has multiple intolerances to antihypertensive medications.  When he saw her last he  continued to see blood pressure elevations despite multiple medication adjustments.  She was referred to the hypertensive clinic with our pharmacist.  The patient was recently admitted to the hospital in October 2020 due to recurrent chest pain and angina symptoms with abdominal pain.  The abdominal pain is her anginal equivalent.  She had a repeat cardiac catheterization 06/22/2019.  This revealed restenosis within the stent of the vein graft to RCA.  She also had mid stenosis of 90% in the vein graft supplying her circumflex marginal vessel.  The LIMA to LAD and diagonal remained patent.  She had an occluded native mid RCA and circumflex and high-grade 70% proximal LAD with 90% mid LAD stenosis, with an occluded second diagonal.  She had a PCI to the vein graft supplying the circumflex marginal vessel and PTCA for the in-stent stenosis in the vein graft supplying her RCA.  She was noted to have marked hyperlipidemia highly suggestive of familial hyperlipidemia with total cholesterol of 300, LDL of 224 and triglycerides of 193.  She was started on PCSK9 therapy as she was intolerant of statins.  She was to be followed by pharmacy in the lipid clinic.  She was started on Praluent after preapproval from health insurance company.  She called our office on 07/31/2019 with complaints of elevated blood pressure.  Amlodipine was increased to 7.5 mg daily.  She was to continue to take and record her blood pressure daily and bring to next office visit.  She again called our office on 08/18/2019 with complaints of sharp hard pain in the epigastric  area below the ribs and into her back that she feels is gas pain.  Belching and use of Gas-X helped some but not totally relieved.  She states it was reminiscent of her prior angina pain.  She was to report to the ER if the symptoms worsened or did not improve.   She is here today anxious, tearful, she states that she is scared that the discomfort is related to her heart  again.  However she states that her epigastric area is usually where she feels her angina pain, and she has been feeling it in the lower abdomen with a lot of burping and gas.  She continues to take Gas-X.  She has not taken any nitroglycerin as she states the pain is not been severe enough.  She states that this is a bad time of the year for her as her husband died on Christmas in 05-Nov-2010.  Past Medical History:  Diagnosis Date  . Anxiety   . CAD (coronary artery disease)    a. CABG x 4 in 11/05/04 in [place] (LIMA->LAD, VG->Diag, VG->OM, VG->RCA); b. DES to midbody of SVG-PDA/PLA 07/2012; c. 03/2015 Myoview: EF 65%, small defect of mild severit in basal inferolateral wall, low risk.  . Complication of anesthesia    "quit breathing when I got ?[place]" (07/28/2012)  . Depression   . Diverticulosis   . Dyslipidemia    Intolerant of statins  . Fecal incontinence   . GERD (gastroesophageal reflux disease)   . Hyperlipidemia   . Hyperplastic colon polyp   . IBS (irritable bowel syndrome)   . Labile hypertension    a. 09/2016 Renal Artery duplex: nl renal arteries.  . Migraines    "had them in my 30's" (07/28/2012)  . Multiple allergies   . Obesity   . Steatohepatitis   . Type II diabetes mellitus (HCC)    a. no meds, diet controlled - takes cinnamon.    Past Surgical History:  Procedure Laterality Date  . ABDOMINAL HYSTERECTOMY  1970's  . BREAST LUMPECTOMY     bilateral  . CARDIAC CATHETERIZATION  11/05/04  . CORONARY ANGIOPLASTY WITH STENT PLACEMENT  07/28/2012   "1; first time for me" (07/28/2012)  . CORONARY ARTERY BYPASS GRAFT  05-Nov-2004   CABG X4  . CORONARY STENT INTERVENTION N/A 03/01/2017   Procedure: Coronary Stent Intervention;  Surgeon: Troy Sine, MD;  Location: [place] [facility];  Service: Cardiovascular;  Laterality: N/A;  . CORONARY STENT INTERVENTION N/A 06/23/2019   Procedure: CORONARY STENT INTERVENTION;  Surgeon: Martinique, Peter M, MD;  Location: [place];   Service: Cardiovascular;  Laterality: N/A;  . EXCISIONAL HEMORRHOIDECTOMY  1970's  . INGUINAL HERNIA REPAIR  1970's?   left  . LEFT HEART CATH AND CORS/GRAFTS ANGIOGRAPHY N/A 03/01/2017   Procedure: Left Heart Cath and Cors/Grafts Angiography;  Surgeon: Troy Sine, MD;  Location: [place] [facility];  Service: Cardiovascular;  Laterality: N/A;  . LEFT HEART CATH AND CORS/GRAFTS ANGIOGRAPHY N/A 06/22/2019   Procedure: LEFT HEART CATH AND CORS/GRAFTS ANGIOGRAPHY;  Surgeon: Belva Crome, MD;  Location: [place] [facility];  Service: Cardiovascular;  Laterality: N/A;  . LEFT HEART CATHETERIZATION WITH CORONARY/GRAFT ANGIOGRAM N/A 07/28/2012   Procedure: LEFT HEART CATHETERIZATION WITH Beatrix Fetters;  Surgeon: Burnell Blanks, MD;  Location: [facility] C[facility] LAB;  Service: Cardiovascular;  Laterality: N/A;  . PERCUTANEOUS CORONARY STENT INTERVENTION (PCI-S)  07/28/2012   Procedure: PERCUTANEOUS CORONARY STENT INTERVENTION (PCI-S);  Surgeon: Annita Brod  Angelena Form, MD;  Location: [facility];  Service: Cardiovascular;;  . TONSILLECTOMY AND ADENOIDECTOMY  ~ 1951     Current Outpatient Medications  Medication Sig Dispense Refill  . Alirocumab (PRALUENT) 75 MG/ML SOAJ Inject 75 mg into the skin every 14 (fourteen) days. 2 pen 11  . amLODipine (NORVASC) 5 MG tablet Take 1 tablet (5 mg total) by mouth daily. 90 tablet 3  . aspirin EC 81 MG tablet Take 81 mg by mouth daily.    . calcium carbonate (TUMS - DOSED IN MG ELEMENTAL CALCIUM) 500 MG chewable tablet Chew 1-2 tablets by mouth 3 (three) times daily as needed for heartburn.     . cholecalciferol (VITAMIN D) 1000 UNITS tablet Take 1,000 Units by mouth daily.    Marland Kitchen CRANBERRY-VITAMIN C PO Take 1 tablet by mouth daily.    Marland Kitchen dicyclomine (BENTYL) 10 MG capsule Take 1 capsule (10 mg total) by mouth 2 (two) times daily as needed for spasms. 30 capsule 0  . Insulin Glargine (TOUJEO MAX SOLOSTAR [place]) Inject 50 Units into the skin daily.     .  Magnesium 250 MG TABS Take 250 mg by mouth daily.    . metoprolol tartrate (LOPRESSOR) 100 MG tablet TAKE 1 TABLET BY MOUTH TWICE A DAY (Patient taking differently: Take 100 mg by mouth 2 (two) times daily. ) 180 tablet 2  . nitroGLYCERIN (NITROSTAT) 0.4 MG SL tablet Place 1 tablet (0.4 mg total) under the tongue every 5 (five) minutes as needed for chest pain (up to 3 doses). 25 tablet 2  . prasugrel (EFFIENT) 10 MG TABS tablet TAKE 1 TABLET(10 MG) BY MOUTH DAILY (Patient taking differently: Take 10 mg by mouth daily. ) 90 tablet 0  . Simethicone (GAS RELIEF 80 PO) Take 80 mg by mouth 3 (three) times daily as needed (gas).    . valsartan (DIOVAN) 160 MG tablet TAKE 1 TABLET(160 MG) BY MOUTH TWICE DAILY (Patient taking differently: Take 160 mg by mouth 2 (two) times daily. ) 180 tablet 3  . vitamin B-12 (CYANOCOBALAMIN) 1000 MCG tablet Take 1,000 mcg by mouth daily.     No current facility-administered medications for this visit.    Allergies:   Betadine [povidone iodine], Codeine, Demerol, Iohexol, Latex, Other, Percocet [oxycodone-acetaminophen], Plavix [clopidogrel bisulfate], Red dye, Shellfish allergy, Sulfonamide derivatives, Tylenol [acetaminophen], Pravastatin sodium, Statins, and Imdur [isosorbide nitrate]    Social History:  The patient  reports that she has never smoked. She has never used smokeless tobacco. She reports that she does not drink alcohol or use drugs.   Family History:  The patient's family history includes Breast cancer in an other family member; Colon cancer in her father; Colon polyps in her father; Coronary artery disease in her father; Diabetes in her maternal grandmother and paternal grandmother; Heart disease in her father; Hypertension in an other family member; Stroke in her mother.    ROS: All other systems are reviewed and negative. Unless otherwise mentioned in H&P    PHYSICAL EXAM: VS:  BP (!) 158/92   Pulse 65   Ht 5' 4"  (1.626 m)   Wt 186 lb 6.4 oz  (84.6 kg)   BMI 32.00 kg/m  , BMI Body mass index is 32 kg/m. GEN: Well nourished, well developed, in no acute distress HEENT: normal Neck: no JVD, carotid bruits, or masses Cardiac: RRR; no murmurs, rubs, or gallops,no edema  Respiratory:  Clear to auscultation bilaterally, normal work of breathing GI: soft, nontender, nondistended, hyperactive BS.  Positive Murphy sign  MS: no deformity or atrophy Skin: warm and dry, no rash Neuro:  Strength and sensation are intact Psych: euthymic mood, full affect   EKG: Normal sinus rhythm, prior anterior lateral infarct is noted.  Heart rate of 65 bpm I did compare it to prior EKG and it is essentially unchanged with some mild T wave flattening in leads II and V6 and V5.  Recent Labs: 06/21/2019: ALT 20 06/24/2019: BUN 19; Creatinine, Ser 0.70; Hemoglobin 13.4; Platelets 263; Potassium 3.9; Sodium 139    Lipid Panel    Component Value Date/Time   CHOL 300 (H) 06/21/2019 1855   TRIG 183 (H) 06/21/2019 1855   HDL 39 (L) 06/21/2019 1855   CHOLHDL 7.7 06/21/2019 1855   VLDL 37 06/21/2019 1855   LDLCALC 224 (H) 06/21/2019 1855   LDLDIRECT 160.9 06/21/2012 0833      Wt Readings from Last 3 Encounters:  08/21/19 186 lb 6.4 oz (84.6 kg)  07/04/19 186 lb 9.6 oz (84.6 kg)  06/24/19 183 lb 4.8 oz (83.1 kg)      Other studies Reviewed: LHC 06/22/2019  Bypass graft failure with high-grade obstruction and SVG to RCA (second restenosis.  90% saphenous vein graft to the obtuse marginal.  Patent sequential LIMA to diagonal and LAD.  Occluded mid right coronary  Occluded circumflex  70% proximal LAD and 90% mid LAD.  Occluded second diagonal.  Normal LV function with elevated LVEDP.  Estimated ejection fraction 65%   RECOMMENDATIONS:   IV heparin to start in 4 hours.  IV nitroglycerin if recurrent chest pain.  IV normal saline.  After speaking with Dr. Georgina Peer, will pause the case and decide if repeat CABG is about option  then repeated stenting of 75 year old grafts.  Coronary Stent Intervention 06/23/2019  SVG.  Prox Graft to Mid Graft lesion is 90% stenosed.  A drug-eluting stent was successfully placed using a STENT SYNERGY DES 3.5X32.  Post intervention, there is a 0% residual stenosis.  SVG.  Dist Graft lesion is 95% stenosed.  Balloon angioplasty was performed using a BALLOON SAPPHIRE [place] 3.5X15.  Post intervention, there is a 0% residual stenosis.   1. Successful PCI of the SVG to OM with DES x 1 2. Successful PCI of the SVG to RCA with POBA.   ASSESSMENT AND PLAN:  1.  CAD: History of coronary artery bypass grafting (LIMA to LAD SVG to diagonal SVG to obtuse marginal and SVG to RCA).  She had a repeat cardiac catheterization June 22, 2019 which revealed restenosis within the stent of the vein graft RCA, mid stenosis of 90% in the vein graft supplying her circumflex marginal vessel.  The LIMA and LAD and diagonal remained patent.  She had a stent placed to a high-grade 70% circumflex, and PTCA for in-stent stenosis of the vein graft supplying her RCA.  She has recurrent burping and abdominal gas pain.  This is different from her usual angina pain which she feels more epigastrically.  She states she finds relief with burping.  EKG is unchanged.  I will reorder nitroglycerin sublingual.  She is advised to call us if she has recurrent worsening pain similar to the pain she had prior to her most recent catheterization which she again reemphasizes was in the epigastric area, and not what she is currently experiencing..  She is advised to report to the ED if her discomfort becomes severe, or more like her angina pain in the past.  She is currently not experiencing  any discomfort today.  2.  Abdominal bloating and pain: Frequent burping.  Relieved with Gas-X.  She does have a positive Murphy sign.  Most recent abdominal ultrasound was negative for cholelithiasis.  Would recommend HIDA scan.  I am going  to rerefer her to [facility] where she is currently being followed for their recommendations  d need to follow-up with this test as I will not be ordering it.  She does have IBS as well.  I will check a CMET.   3.  Dysuria: She complains of burning with urination.  She is requesting a UA be completed today.  4.  Hypertension: Not optimal for patient with CAD.  She is very nervous and fretful today.  I did recheck it here in the office clinic and it was 148/86.  May need to consider adding isosorbide.  She is allergic to multiple medications which makes it more difficult to adjust medications.  She continues on amlodipine, metoprolol, valsartan.  She is to follow in the pharmacy for hypertensive management. Current medicines are reviewed at length with the patient today.    5.  Hyperlipidemia: She is currently on Praluent and is followed by pharmacy.  Labs/ tests ordered today include: CM ET, UA  Phill Myron. West Pugh, ANP, AACC   08/21/2019 11:33 AM    [facility] [place] [address] Office [phone] Fax [phone]  Notice: This dictation was prepared with Dragon dictation along with smaller phrase technology. Any transcriptional errors that result from this process are unintentional and may not be corrected upon review.

## 2019-08-21 ENCOUNTER — Ambulatory Visit: Payer: Medicare Other | Admitting: Adult Health

## 2019-08-21 ENCOUNTER — Other Ambulatory Visit: Payer: Self-pay

## 2019-08-21 ENCOUNTER — Encounter: Payer: Self-pay | Admitting: Adult Health

## 2019-08-21 VITALS — BP 158/92 | HR 65 | Ht 64.0 in | Wt 186.4 lb

## 2019-08-21 DIAGNOSIS — R300 Dysuria: Secondary | ICD-10-CM

## 2019-08-21 DIAGNOSIS — Z79899 Other long term (current) drug therapy: Secondary | ICD-10-CM | POA: Diagnosis not present

## 2019-08-21 DIAGNOSIS — E785 Hyperlipidemia, unspecified: Secondary | ICD-10-CM

## 2019-08-21 DIAGNOSIS — R079 Chest pain, unspecified: Secondary | ICD-10-CM

## 2019-08-21 DIAGNOSIS — I2510 Atherosclerotic heart disease of native coronary artery without angina pectoris: Secondary | ICD-10-CM

## 2019-08-21 DIAGNOSIS — R198 Other specified symptoms and signs involving the digestive system and abdomen: Secondary | ICD-10-CM

## 2019-08-21 DIAGNOSIS — K5790 Diverticulosis of intestine, part unspecified, without perforation or abscess without bleeding: Secondary | ICD-10-CM

## 2019-08-21 DIAGNOSIS — I10 Essential (primary) hypertension: Secondary | ICD-10-CM | POA: Diagnosis not present

## 2019-08-21 MED ORDER — NITROGLYCERIN 0.4 MG SL SUBL: 0.4000 mg | SUBLINGUAL_TABLET | SUBLINGUAL | 2 refills | Status: DC | PRN

## 2019-08-21 NOTE — Patient Instructions (Signed)
Medication Instructions:  Continue current medications  If you need a refill on your cardiac medications before your next appointment, please call your pharmacy.  Labwork: CMP and Urinalysis  HERE IN OUR OFFICE AT [facility]  You will NOT need to fast   If you have labs (blood work) drawn today and your tests are completely normal, you will receive your results only by: Marland Kitchen MyChart Message (if you have MyChart) OR . A paper copy in the mail If you have any lab test that is abnormal or we need to change your treatment, we will call you to review the results.  Testing/Procedures: None [place]  Special Instructions: [facility] Phone: [phone]  Follow-Up: Keep appointment with Dr Claiborne Billings in February  At [facility], you and your health needs are our priority.  As part of our continuing mission to provide you with exceptional heart care, we have created designated Provider Care Teams.  These Care Teams include your primary Cardiologist (physician) and Advanced Practice Providers (APPs -  Physician Assistants and Nurse Practitioners) who all work together to provide you with the care you need, when you need it.  Thank you for choosing [facility] at [facility]!!        Happy Holidays!!

## 2019-08-22 LAB — COMPREHENSIVE METABOLIC PANEL
ALT: 12 IU/L (ref 0–32)
AST: 16 IU/L (ref 0–40)
Albumin/Globulin Ratio: 1.7 (ref 1.2–2.2)
Albumin: 4.5 g/dL (ref 3.7–4.7)
Alkaline Phosphatase: 75 IU/L (ref 39–117)
BUN/Creatinine Ratio: 16 (ref 12–28)
BUN: 12 mg/dL (ref 8–27)
Bilirubin Total: 0.6 mg/dL (ref 0.0–1.2)
CO2: 24 mmol/L (ref 20–29)
Calcium: 10 mg/dL (ref 8.7–10.3)
Chloride: 103 mmol/L (ref 96–106)
Creatinine, Ser: 0.74 mg/dL (ref 0.57–1.00)
GFR calc Af Amer: 92 mL/min/{1.73_m2} (ref >59)
GFR calc non Af Amer: 80 mL/min/{1.73_m2} (ref >59)
Globulin, Total: 2.6 g/dL (ref 1.5–4.5)
Glucose: 125 mg/dL — ABNORMAL HIGH (ref 65–99)
Potassium: 4.8 mmol/L (ref 3.5–5.2)
Sodium: 141 mmol/L (ref 134–144)
Total Protein: 7.1 g/dL (ref 6.0–8.5)

## 2019-08-22 LAB — URINALYSIS
Bilirubin, UA: NEGATIVE
Glucose, UA: NEGATIVE
Ketones, UA: NEGATIVE
Leukocytes,UA: NEGATIVE
Nitrite, UA: NEGATIVE
Protein,UA: NEGATIVE
RBC, UA: NEGATIVE
Specific Gravity, UA: 1.011 (ref 1.005–1.030)
Urobilinogen, Ur: 0.2 mg/dL (ref 0.2–1.0)
pH, UA: 6 (ref 5.0–7.5)

## 2019-08-25 ENCOUNTER — Telehealth: Payer: Self-pay | Admitting: Cardiovascular Disease

## 2019-08-25 NOTE — Telephone Encounter (Signed)
Patient call for her lab results.

## 2019-08-25 NOTE — Telephone Encounter (Signed)
The labs have not been released yet.I have messaged N.Marcello Moores, CMA for assistance as provider is not in office today

## 2019-08-28 NOTE — Telephone Encounter (Signed)
Follow up    Please call patient with lab results

## 2019-08-28 NOTE — Telephone Encounter (Signed)
Routed to K. McMullen

## 2019-08-28 NOTE — Telephone Encounter (Signed)
Spoke to pt concerning her lab result, pt voice understanding and thanks for the call back

## 2019-09-07 ENCOUNTER — Other Ambulatory Visit: Payer: Self-pay | Admitting: Cardiovascular Disease

## 2019-09-07 ENCOUNTER — Other Ambulatory Visit: Payer: Self-pay

## 2019-09-07 MED ORDER — PRASUGREL HCL 10 MG PO TABS: ORAL_TABLET | ORAL | 0 refills | Status: DC

## 2019-09-27 ENCOUNTER — Ambulatory Visit: Payer: Medicare Other | Admitting: Gastroenterology

## 2019-09-28 ENCOUNTER — Encounter: Payer: Self-pay | Admitting: Cardiovascular Disease

## 2019-10-09 ENCOUNTER — Telehealth: Payer: Self-pay | Admitting: Cardiovascular Disease

## 2019-10-09 NOTE — Telephone Encounter (Signed)
Patient is requesting to do a virtual visit with Dr. Claiborne Billings for her 10/12/19 appt. She is concerned about the virus and just went and saw her PCP. Her PCP was also supposed to send lab results over and would like to know if we have received those.

## 2019-10-09 NOTE — Telephone Encounter (Signed)
Notified patient OK for VV on Feb 4 with Dr. Claiborne Billings. She just had VV with PCP last week and had labs (in [facility]). Appointment type changed. Patient is able to check VS at home.

## 2019-10-12 ENCOUNTER — Encounter: Payer: Self-pay | Admitting: Cardiovascular Disease

## 2019-10-12 ENCOUNTER — Telehealth (INDEPENDENT_AMBULATORY_CARE_PROVIDER_SITE_OTHER): Payer: Medicare Other | Admitting: Cardiovascular Disease

## 2019-10-12 VITALS — BP 159/74 | HR 69 | Ht 64.0 in | Wt 184.0 lb

## 2019-10-12 DIAGNOSIS — Z789 Other specified health status: Secondary | ICD-10-CM

## 2019-10-12 DIAGNOSIS — I2510 Atherosclerotic heart disease of native coronary artery without angina pectoris: Secondary | ICD-10-CM

## 2019-10-12 DIAGNOSIS — E785 Hyperlipidemia, unspecified: Secondary | ICD-10-CM

## 2019-10-12 DIAGNOSIS — I10 Essential (primary) hypertension: Secondary | ICD-10-CM | POA: Diagnosis not present

## 2019-10-12 DIAGNOSIS — E669 Obesity, unspecified: Secondary | ICD-10-CM

## 2019-10-12 DIAGNOSIS — Z889 Allergy status to unspecified drugs, medicaments and biological substances status: Secondary | ICD-10-CM

## 2019-10-12 NOTE — Progress Notes (Signed)
Virtual Visit via Telephone Note   This visit type was conducted due to national recommendations for restrictions regarding the COVID-19 Pandemic (e.g. social distancing) in an effort to limit this patient's exposure and mitigate transmission in our community.  Due to her co-morbid illnesses, this patient is at least at moderate risk for complications without adequate follow up.  This format is felt to be most appropriate for this patient at this time.  The patient did not have access to video technology/had technical difficulties with video requiring transitioning to audio format only (telephone).  All issues noted in this document were discussed and addressed.  No physical exam could be performed with this format.  Please refer to the patient's chart for her  consent to telehealth for [facility].   Date:  10/14/2019   ID:  Ruth [name], DOB Dec 11, 1945, MRN [id number]  Patient Location: Home Provider Location: Office  PCP:  Crist Infante, MD  Cardiologist:  [name], MD  Electrophysiologist:  None   Evaluation Performed:  Follow-Up Visit  Chief Complaint:  4 month F/U  History of Present Illness:    Ruth [name] is a 74 y.o. female who  has a history of coronary artery disease.  While living in [place] she developed chest discomfort and in 11/02/04 underwent CABG surgery 4 at the [facility] in [place] by Dr. Marzetta Merino.  She had a LIMA to LAD, SVG to diagonal, SVG to obtuse marginal, and SVG to the RCA.   Her husband died in 2010-11-02 and she moved to the [place] area.  In 11-03-2011, she experienced symptoms of increasing shortness of breath.  She underwent cardiac catheterization and a stent was placed in the vein graft to her RCA.  She states that she has not had any subsequent stress testing.  She is a former patient of Dr. Einar Gip and an echocardiogram in July 2014 showed normal LV size and function with mild concentric left ventricular hypertrophy.  She had mild  to moderate mitral regurgitation, mild tricuspid regurgitation, and mild primary hypertension with an estimated PA pressure 39 mm.  She has a history of hypertension for at least 15-20 years.  She states her blood pressure at home typically is in the 145-150 range, but her blood pressure is always elevated when she goes to the doctor's office.  She has a history of hyperlipidemia and apparently has been intolerant to statins but has never tried Zetia.  There is a history of diabetes mellitus but is not on therapy and states this is diet controlled.    She admits to  increased stress.  Her mother died in 12-01-2014 and recently her dog died.  She does not routinely exercise.  When I initially saw her in June 2016 she has been taking amlodipine 5 mg, losartan 50 mg twice a day and metoprolol 50 mrem twice a day for hypertension and CAD.  She has a history of multiple allergies.  She states that she is Intolerant to amlodipine as well as Spironolactone.  She has been having labile blood pressure.  In the past. She became dehydrated on diarrhetic therapy.    An echo Doppler study on 03/13/2015  showed an ejection fraction at 55-60%.  There was mild mitral regurgitation, mild left atrial dilatation, and very mild pulmonary hypertension with an estimated PA pressure 32 mm.  A nuclear perfusion study done on 03/21/2015 was low risk and demonstrated a very small region of mild ischemia in the basal  anterolateral wall.  She had normal LV function with an EF of 65%.  She developed recurrent episodes of chest pain in June 2018 which  led to her undergoing definitive cardiac catheterization on 03/01/2017.  She was found to have preserved global LV contractility with an EF of 55-60%.  There was significant multivessel CAD with 70% diffuse proximal LAD stenoses, total occlusion of the circumflex and OM1 proximally, and 75% proximal RCA stenosis with occlusion of the RCA prior to the acute margin.  She had a patent  LIMA graft supplying the mid LAD, which may also anastomose into the diagonal vessel, but this was uncertain.  The vein graft supplying the distal marginal circumflex branch had smooth 60% proximal to mid body stenosis with TIMI-3 flow.  The vein graft supplying the distal RCA had focal 60% stenosis followed by 99% thrombotic stenosis beyond a stented segment.  She underwent successful PCI to this vein graft to the RCA with insertion of a 3.532 mm Synergy stent postdilated to 3.71 mm with a Radiation protection practitioner used for distal graft protection.  It was recommended that she continue to antiplatelet therapy indefinitely.  Ruth [name] has significant allergies to multiple medications.  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  She has been on losartan 50 Milligan grams twice a day, metoprolol 100 mg twice a day, and continues to take aspirin 81 mg and Effient 10 mg.  She did not tolerate Plavix or Brilinta.  She states that she is intolerant to statins.  She has no interest in PCSK9 inhibition.    Since I saw her in October 2018, she was seen in the office at least 5 times by extenders as well as pharmacists.  She is intolerant to numerous medications.  She last saw Nehemiah Massed, [facility] on December 02, 2017 who suggested the possibility of minoxidil but she continued to deny this medication change.  She states most recently her blood pressure at home has been ranging from 295-188 systolically and 41-66 diastolically.  She had called the office stating that she was having pain running through the area where she had a stent.  This was relieved with "water and vinegar which she drinks for gas.   When I saw her in May 2019, her blood pressure continued to be elevated in the office despite taking amlodipine 2.5 mg (she stated she could not tolerate 5 mg), metoprolol 100 mg twice a day, and she was taking valsartan  160 in the morning and only three quarters of a tablet in the evening.  With her blood pressure elevation I suggested further titration of valsartan to 160 twice daily and added Cardura 1 mg initially at night with plans to titrate to 2 mg.  I recommended follow-up hypertensive clinic evaluations with Joslyn Hy as well as Raquel Joellyn Haff who she has seen on numerous occasions.  He continues to admit to intolerance to numerous medications.  I last saw her in February 2020 at which time she was feeling significantly better.   She continued to have blood pressure elevation in the office and told me typically blood pressure at home was ranging around 063 systolically.  Ruth [name] was recently admitted to the hospital with recurrent abdominal pain which is her anginal equivalent.  I recommended repeat definitive cardiac catheterization since prior to her last intervention she experienced the same symptoms which improved following intervention to her vein graft to  RCA.  Diagnostic catheterization was done on June 22, 2019 by Dr. Tamala Julian and she again had developed restenosis within the stent in the vein graft to her RCA.  She also had new stenosis of 90% in the vein graft supplying her circumflex marginal vessel.  Her LIMA to LAD and diagonal remain patent.  She had an occluded native mid RCA and circumflex and high-grade 70% proximal LAD with 90% mid LAD stenosis with an occluded second diagonal.  After much discussion we ultimately recommended PCI and the following day she underwent successful intervention with stenting to the vein graft supplying her circumflex marginal vessel and PTCA for the in-stent restenosis in the vein graft supplying her RCA.  Subsequently, her symptoms have improved.  Of note, during her hospitalization on presentation she had marked hyperlipidemia highly suggestive of familial hyperlipidemia with total cholesterol at 300, LDL cholesterol 224 and triglycerides  183.  In the past she has not been able to tolerate statins or Zetia.  I recommended initiation of PCSK9 therapy.   She had called the office on #[id number] with complaints of elevated blood pressure.  Amlodipine was increased to 7.5 mg daily.  She was to continue to take and record blood pressure readings daily.  She again called the office on August 18, 2019 with complaints of a sharp epigastric discomfort that she felt was gas but Gas-X only partially improved her symptoms.  She was w evaluated by Bunnie Domino, NP  in the office on August 21, 2019 for follow-up evaluation.  She was having some frequent burping and gas symptoms.  Her blood pressure was elevated at 158/92.  She had undergone prior abdominal ultrasound which was negative for cholelithiasis.  GI evaluation was recommended.  Presently, she feels improved.  She is to see Dr. Henrene Pastor.  The patient recent lab Elder Love was reviewed from October 05, 2019 at [facility].  Since initiating Praluent, lipid studies had improved from an LDL of 224 223 on September 29, 2019.  I mentioned to her that she was only on the 75 mg dose of Praluent and my recommendation would be to titrate this to the 150 mg dose.  She also continues to have blood pressure lability and was adamant about increasing medications.  The patient does not have symptoms concerning for COVID-19 infection (fever, chills, cough, or new shortness of breath).    Past Medical History:  Diagnosis Date  . Anxiety   . CAD (coronary artery disease)    a. CABG x 4 in 2006 in [place] (LIMA->LAD, VG->Diag, VG->OM, VG->RCA); b. DES to midbody of SVG-PDA/PLA 07/2012; c. 03/2015 Myoview: EF 65%, small defect of mild severit in basal inferolateral wall, low risk.  . Complication of anesthesia    "quit breathing when I got ?[place]" (07/28/2012)  . Depression   . Diverticulosis   . Dyslipidemia    Intolerant of statins  . Fecal incontinence   . GERD (gastroesophageal reflux  disease)   . Hyperlipidemia   . Hyperplastic colon polyp   . IBS (irritable bowel syndrome)   . Labile hypertension    a. 09/2016 Renal Artery duplex: nl renal arteries.  . Migraines    "had them in my 30's" (07/28/2012)  . Multiple allergies   . Obesity   . Steatohepatitis   . Type II diabetes mellitus (HCC)    a. no meds, diet controlled - takes cinnamon.   Past Surgical History:  Procedure Laterality Date  . ABDOMINAL HYSTERECTOMY  1970's  .  BREAST LUMPECTOMY     bilateral  . CARDIAC CATHETERIZATION  2006  . CORONARY ANGIOPLASTY WITH STENT PLACEMENT  07/28/2012   "1; first time for me" (07/28/2012)  . CORONARY ARTERY BYPASS GRAFT  2006   CABG X4  . CORONARY STENT INTERVENTION N/A 03/01/2017   Procedure: Coronary Stent Intervention;  Surgeon: Troy Sine, MD;  Location: [place] [facility];  Service: Cardiovascular;  Laterality: N/A;  . CORONARY STENT INTERVENTION N/A 06/23/2019   Procedure: CORONARY STENT INTERVENTION;  Surgeon: Martinique, Peter M, MD;  Location: [place] [facility];  Service: Cardiovascular;  Laterality: N/A;  . EXCISIONAL HEMORRHOIDECTOMY  1970's  . INGUINAL HERNIA REPAIR  1970's?   left  . LEFT HEART CATH AND CORS/GRAFTS ANGIOGRAPHY N/A 03/01/2017   Procedure: Left Heart Cath and Cors/Grafts Angiography;  Surgeon: Troy Sine, MD;  Location: [place] [facility];  Service: Cardiovascular;  Laterality: N/A;  . LEFT HEART CATH AND CORS/GRAFTS ANGIOGRAPHY N/A 06/22/2019   Procedure: LEFT HEART CATH AND CORS/GRAFTS ANGIOGRAPHY;  Surgeon: Belva Crome, MD;  Location: [place] [facility];  Service: Cardiovascular;  Laterality: N/A;  . LEFT HEART CATHETERIZATION WITH CORONARY/GRAFT ANGIOGRAM N/A 07/28/2012   Procedure: LEFT HEART CATHETERIZATION WITH Beatrix Fetters;  Surgeon: Burnell Blanks, MD;  Location: [facility] CATH LAB;  Service: Cardiovascular;  Laterality: N/A;  . PERCUTANEOUS CORONARY STENT INTERVENTION (PCI-S)  07/28/2012   Procedure:  PERCUTANEOUS CORONARY STENT INTERVENTION (PCI-S);  Surgeon: Burnell Blanks, MD;  Location: [facility] CATH LAB;  Service: Cardiovascular;;  . TONSILLECTOMY AND ADENOIDECTOMY  ~ 1951     Current Meds  Medication Sig  . Alirocumab (PRALUENT) 75 MG/ML SOAJ Inject 75 mg into the skin every 14 (fourteen) days.  Marland Kitchen amLODipine (NORVASC) 5 MG tablet Take 1 tablet (5 mg total) by mouth daily.  Marland Kitchen aspirin EC 81 MG tablet Take 81 mg by mouth daily.  . calcium carbonate (TUMS - DOSED IN MG ELEMENTAL CALCIUM) 500 MG chewable tablet Chew 1-2 tablets by mouth 3 (three) times daily as needed for heartburn.   . cholecalciferol (VITAMIN D) 1000 UNITS tablet Take 1,000 Units by mouth daily.  Marland Kitchen CRANBERRY-VITAMIN C PO Take 1 tablet by mouth daily.  Marland Kitchen dicyclomine (BENTYL) 10 MG capsule Take 1 capsule (10 mg total) by mouth 2 (two) times daily as needed for spasms.  . Insulin Glargine (TOUJEO MAX SOLOSTAR [place]) Inject 50 Units into the skin daily.   . Magnesium 250 MG TABS Take 250 mg by mouth daily.  . metoprolol tartrate (LOPRESSOR) 100 MG tablet Take 1 tablet (100 mg total) by mouth 2 (two) times daily.  . nitroGLYCERIN (NITROSTAT) 0.4 MG SL tablet Place 1 tablet (0.4 mg total) under the tongue every 5 (five) minutes as needed for chest pain (up to 3 doses).  . prasugrel (EFFIENT) 10 MG TABS tablet TAKE 1 TABLET(10 MG) BY MOUTH DAILY  . Simethicone (GAS RELIEF 80 PO) Take 80 mg by mouth 3 (three) times daily as needed (gas).  . valsartan (DIOVAN) 160 MG tablet TAKE 1 TABLET(160 MG) BY MOUTH TWICE DAILY (Patient taking differently: Take 160 mg by mouth 2 (two) times daily. )  . vitamin B-12 (CYANOCOBALAMIN) 1000 MCG tablet Take 1,000 mcg by mouth daily.     Allergies:   Betadine [povidone iodine], Codeine, Demerol, Iohexol, Latex, Other, Percocet [oxycodone-acetaminophen], Plavix [clopidogrel bisulfate], Red dye, Shellfish allergy, Sulfonamide derivatives, Tylenol [acetaminophen], Pravastatin sodium, Statins, and  Imdur [isosorbide nitrate]   Social History   Tobacco Use  .  Smoking status: Never Smoker  . Smokeless tobacco: Never Used  Substance Use Topics  . Alcohol use: No    Alcohol/week: 0.0 standard drinks  . Drug use: No     Family Hx: The patient's family history includes Breast cancer in an other family member; Colon cancer in her father; Colon polyps in her father; Coronary artery disease in her father; Diabetes in her maternal grandmother and paternal grandmother; Heart disease in her father; Hypertension in an other family member; Stroke in her mother. There is no history of Esophageal cancer, Rectal cancer, or Stomach cancer.  ROS:   Please see the history of present illness.    She denies fevers chills night sweats No chest tightness No palpitations Occasional gas and abdominal bloating No swelling Positive anxiety All other systems reviewed and are negative.   Prior CV studies:   The following studies were reviewed today:  I reviewed the patient's most recent cardiac catheterization in detail.  I reviewed her hospital records, laboratory before and after PCSK9 inhibition and records since my last evaluation  Labs/Other Tests and Data Reviewed:    EKG:  An ECG dated August 21, 2019 was personally reviewed today and demonstrated:  Normal sinus rhythm at 65 bpm.  Nondiagnostic T wave abnormality in leads I, aVL, V1 and V2  Recent Labs: 06/24/2019: Hemoglobin 13.4; Platelets 263 08/21/2019: ALT 12; BUN 12; Creatinine, Ser 0.74; Potassium 4.8; Sodium 141   Recent Lipid Panel Lab Results  Component Value Date/Time   CHOL 300 (H) 06/21/2019 06:55 PM   TRIG 183 (H) 06/21/2019 06:55 PM   HDL 39 (L) 06/21/2019 06:55 PM   CHOLHDL 7.7 06/21/2019 06:55 PM   LDLCALC 224 (H) 06/21/2019 06:55 PM   LDLDIRECT 160.9 06/21/2012 08:33 AM    Wt Readings from Last 3 Encounters:  10/12/19 184 lb (83.5 kg)  08/21/19 186 lb 6.4 oz (84.6 kg)  07/04/19 186 lb 9.6 oz (84.6 kg)      Objective:    Vital Signs:  BP (!) 159/74   Pulse 69   Ht 5' 4"  (1.626 m)   Wt 184 lb (83.5 kg)   SpO2 97%   BMI 31.58 kg/m   Since this was a telemedicine visit I could not physically examine the patient. Breathing was normal and not labored There was no audible wheezing She denied any chest pain. She had some anxiety She was headstrong and not wanting to consider any dose titration of medications presently   ASSESSMENT & PLAN:    1. Coronary artery disease involving native coronary artery of native heart without angina pectoris   2. Essential hypertension   3. Statin intolerance   4. Hyperlipidemia with target LDL less than 70   5. Obesity (BMI 30-39.9)   6. Drug allergy, multiple    Ruth [name] is a 74 year old female who has multiple intolerances to medications.  She is status post CABG revascularization surgery x4 in 2006 with a LIMA to LAD, SVG to diagonal, SVG to OM, and SVG to RCA.  In October she was admitted to the hospital with recurrent abdominal pain which is her anginal equivalent.  She was found to have restenosis within the stent of the vein graft to RCA as well as a mid stenosis of graft to a marginal vessel and underwent successful staged intervention.  Presently, she is without the abdominal pain which is her anginal equivalent.  She has continued to have issues with labile hypertension and is refused medication titration.  Most recently she has been on 25 mg of amlodipine and I again have suggested increasing this to 5 mg.  She also is on metoprolol tartrate 100 mg twice a day in addition to valsartan 160 mg twice a day.  I again discussed with her target blood pressure is less than 130/80.  She is now on PCSK9 inhibition with Praluent.  LDL is significantly improved but remains significantly elevated at 123 at her current low-dose of 75 mg every 2 weeks.  I have suggested titration to 150 mg but she refuses to do this.  I also suggested the addition of Zetia  but she does not want to start any other new medication presently.  She will contemplate the above changes.  She will monitor her blood pressure at home and let us know if this remains elevated.  She will be following up with her primary physician Dr. Abner Greenspan.  I will see her in 6 months for reevaluation.   COVID-19 Education: The signs and symptoms of COVID-19 were discussed with the patient and how to seek care for testing (follow up with PCP or arrange E-visit).  The importance of social distancing was discussed today.  Time:   Today, I have spent 29 minutes with the patient with telehealth technology discussing the above problems.     Medication Adjustments/Labs and Tests Ordered: Current medicines are reviewed at length with the patient today.  Concerns regarding medicines are outlined above.   Tests Ordered: No orders of the defined types were placed in this encounter.   Medication Changes: No orders of the defined types were placed in this encounter.   Follow Up:  In Person 6 months  Signed, [name], MD  10/14/2019 6:24 PM    [facility]

## 2019-10-12 NOTE — Patient Instructions (Signed)
Medication Instructions:  CONTINUE WITH CURRENT MEDICATIONS.  TAKE YOUR AMLODIPINE 5MG (1 TABLET) DAILY. *If you need a refill on your cardiac medications before your next appointment, please call your pharmacy*  Follow-Up: At [facility], you and your health needs are our priority.  As part of our continuing mission to provide you with exceptional heart care, we have created designated Provider Care Teams.  These Care Teams include your primary Cardiologist (physician) and Advanced Practice Providers (APPs -  Physician Assistants and Nurse Practitioners) who all work together to provide you with the care you need, when you need it.  Your next appointment:   6 month(s)  The format for your next appointment:   In Person  Provider:   [name], MD

## 2019-10-13 ENCOUNTER — Telehealth: Payer: Self-pay | Admitting: Cardiovascular Disease

## 2019-10-13 NOTE — Telephone Encounter (Signed)
Called patient back- she states that she does have issues with her blood pressure increasing at times and today she woke up and it was 200/100, she states she had taken her normal morning medications already this morning at 5 AM, but states that her face has become red and she has had the shakes. She has no chest pain, but is concerned about her BP.  She just checked her BP again right before calling and it was 190/82 HR 100. Patient states she is very anxious and sounds nervous on the phone. I did advise patient to calm down, and take some deep breaths. But she wanted to know if she could take her 2nd metoprolol now or if she should wait. I advised I would send a message to [facility] and PharmD to advise.

## 2019-10-13 NOTE — Telephone Encounter (Signed)
Agree with increase.  I tried increasing medications yesterday but the patient refused

## 2019-10-13 NOTE — Telephone Encounter (Signed)
Patient taking amlodipine 2.69m daily instead of 533mdaily.Instructed to take 2.78m42mwice daily for now.  Pulse back down to 80s and BP 168/68

## 2019-10-13 NOTE — Telephone Encounter (Signed)
Pt c/o BP issue: STAT if pt c/o blurred vision, one-sided weakness or slurred speech  1. What are your last 5 BP readings? 200/100, 190/82  2. Are you having any other symptoms (ex. Dizziness, headache, blurred vision, passed out)?  very weak and shaky, face is red  3. What is your BP issue? Patient states her BP has been very high today. She would like a call back with advise.

## 2019-11-02 ENCOUNTER — Telehealth: Payer: Self-pay | Admitting: Nurse Practitioner

## 2019-11-02 NOTE — Telephone Encounter (Signed)
Spoke with the patient. She complains of a burning abdominal pain with bloating. She has had recent cardiac work with stent placements. She has been in touch with her cardiologist as they adjust her HTN medication. She does not want to go to the ED. States this has been going on for weeks. Accepts next available appointment 11/09/19 at 1:30 pm.

## 2019-11-09 ENCOUNTER — Ambulatory Visit: Payer: Medicare Other | Admitting: Nurse Practitioner

## 2019-11-09 ENCOUNTER — Other Ambulatory Visit (INDEPENDENT_AMBULATORY_CARE_PROVIDER_SITE_OTHER): Payer: Medicare Other

## 2019-11-09 ENCOUNTER — Encounter: Payer: Self-pay | Admitting: Nurse Practitioner

## 2019-11-09 VITALS — BP 144/80 | HR 78 | Temp 98.6°F | Ht 64.0 in | Wt 185.0 lb

## 2019-11-09 DIAGNOSIS — R109 Unspecified abdominal pain: Secondary | ICD-10-CM | POA: Diagnosis not present

## 2019-11-09 DIAGNOSIS — R300 Dysuria: Secondary | ICD-10-CM

## 2019-11-09 LAB — URINALYSIS, ROUTINE W REFLEX MICROSCOPIC
Bilirubin Urine: NEGATIVE
Ketones, ur: NEGATIVE
Nitrite: NEGATIVE
Specific Gravity, Urine: 1.01 (ref 1.000–1.030)
Total Protein, Urine: NEGATIVE
Urine Glucose: NEGATIVE
Urobilinogen, UA: 0.2 (ref 0.0–1.0)
pH: 6.5 (ref 5.0–8.0)

## 2019-11-09 NOTE — Progress Notes (Signed)
IMPRESSION and PLAN:    Ruth [name] is a 74 y.o. female with a pmh significant for, not necessarily limited to CAD s/p remote CABG / PCI October 2020, HTN, DM, obesity, IBS, GERD, hepatic steatosis, chronic abdominal pain, hx of colon polyps, pelvic floor dysfunction   # Hx of  IBS with chronic bloating and chronic LLQ pain present for years.  She has multiple allergies and anxious about possibility of allergic reactions with medications. Imaging and colonoscopies through the years have been unrevealing --trial of FDguard BID, samples given.  --If above not helpful then can try Bentyl. ( on home med list but she doesn't recall ever starting it).   # Hx of colon polyps.  --She is overdue for surveillance colonoscopy but previously didn't want to proceed. Currently not a good time as she had cardiac stent placed in October. On Effient  # mild dysuria -will check u/a  # Multiple drug allergies / sensitives  HPI:    Primary GI: Dr. Fuller Plan  Chief complaint :  Bloating, gas, abdominal pain  **History comes from the chart and patient  Ruth [name] is a 74 yo female who we have followed for years for IBS.  Her last with [place] was Sept 2020 for abdominal pain, bloating, loose stools with unrevealing CT scan (ordered by PCP). Ruth [name] returns with complaints of bloating and worsening of her chronic LLQ pain. Bentyl on home medication list but patient doesn't ever think she tried it. So many medications don't agree with her.She complains of pain in left arm associated with the LLQ pain. The pain gets worse about 15 minutes after eating, it improves after BM. Her BMs are at baseline varying between loose and solid. She has chronic fecal incontinence and underwent evaluation for pelvic floor dysfunction at [facility] in 2010. Dr. Olevia Perches offered her another pelvic floor evaluation / treatment at [facility]rology center but she wasn't interested.    Since we last saw her she was hospitalized in October with  unstable angina. Multivessel disease on cardiac cath but she declined CABG. Continued to have chest pain, taken back to cath lab and underwent DES and angioplasty.  Data Reviewed:  08/21/19 CMP - ok  Review of systems:     No chest pain, no SOB, no fevers, no urinary sx   Past Medical History:  Diagnosis Date  . Anxiety   . CAD (coronary artery disease)    a. CABG x 4 in 2006 in [place] (LIMA->LAD, VG->Diag, VG->OM, VG->RCA); b. DES to midbody of SVG-PDA/PLA 07/2012; c. 03/2015 Myoview: EF 65%, small defect of mild severit in basal inferolateral wall, low risk.  . Complication of anesthesia    "quit breathing when I got ?Inovar" (07/28/2012)  . Depression   . Diverticulosis   . Dyslipidemia    Intolerant of statins  . Fecal incontinence   . GERD (gastroesophageal reflux disease)   . Hyperlipidemia   . Hyperplastic colon polyp   . IBS (irritable bowel syndrome)   . Labile hypertension    a. 09/2016 Renal Artery duplex: nl renal arteries.  . Migraines    "had them in my 30's" (07/28/2012)  . Multiple allergies   . Obesity   . Steatohepatitis   . Type II diabetes mellitus (HCC)    a. no meds, diet controlled - takes cinnamon.    Patient's surgical history, family medical history, social history, medications and allergies were all reviewed in Epic   Creatinine  clearance cannot be calculated (Patient's most recent lab result is older than the maximum 21 days allowed.)  Current Outpatient Medications  Medication Sig Dispense Refill  . Alirocumab (PRALUENT) 75 MG/ML SOAJ Inject 75 mg into the skin every 14 (fourteen) days. 2 pen 11  . amLODipine (NORVASC) 5 MG tablet Take 1 tablet (5 mg total) by mouth daily. 90 tablet 3  . aspirin EC 81 MG tablet Take 81 mg by mouth daily.    . calcium carbonate (TUMS - DOSED IN MG ELEMENTAL CALCIUM) 500 MG chewable tablet Chew 1-2 tablets by mouth 3 (three) times daily as needed for heartburn.     . cholecalciferol (VITAMIN D) 1000 UNITS  tablet Take 1,000 Units by mouth daily.    Marland Kitchen CRANBERRY-VITAMIN C PO Take 1 tablet by mouth daily.    Marland Kitchen dicyclomine (BENTYL) 10 MG capsule Take 1 capsule (10 mg total) by mouth 2 (two) times daily as needed for spasms. 30 capsule 0  . Insulin Glargine (TOUJEO MAX SOLOSTAR [place]) Inject 50 Units into the skin daily.     . Magnesium 250 MG TABS Take 250 mg by mouth daily.    . metoprolol tartrate (LOPRESSOR) 100 MG tablet Take 1 tablet (100 mg total) by mouth 2 (two) times daily. 180 tablet 1  . nitroGLYCERIN (NITROSTAT) 0.4 MG SL tablet Place 1 tablet (0.4 mg total) under the tongue every 5 (five) minutes as needed for chest pain (up to 3 doses). 25 tablet 2  . prasugrel (EFFIENT) 10 MG TABS tablet TAKE 1 TABLET(10 MG) BY MOUTH DAILY 90 tablet 0  . Simethicone (GAS RELIEF 80 PO) Take 80 mg by mouth 3 (three) times daily as needed (gas).    . valsartan (DIOVAN) 160 MG tablet TAKE 1 TABLET(160 MG) BY MOUTH TWICE DAILY (Patient taking differently: Take 160 mg by mouth 2 (two) times daily. ) 180 tablet 3  . vitamin B-12 (CYANOCOBALAMIN) 1000 MCG tablet Take 1,000 mcg by mouth daily.     No current facility-administered medications for this visit.    Physical Exam:     BP (!) 144/80   Pulse 78   Temp 98.6 F (37 C)   Ht 5' 4"  (1.626 m)   Wt 185 lb (83.9 kg)   BMI 31.76 kg/m   GENERAL:  Pleasant female in NAD PSYCH: : Cooperative, normal affect CARDIAC:  RRR,  no peripheral edema PULM: Normal respiratory effort, lungs CTA bilaterally, no wheezing ABDOMEN:  Nondistended, soft, nontender. No obvious masses, no hepatomegaly,  normal bowel sounds SKIN:  turgor, no lesions seen Musculoskeletal:  Normal muscle tone, normal strength NEURO: Alert and oriented x 3, no focal neurologic deficits  I spent 30 minutes total reviewing records, obtaining history, performing exam, counseling patient and documenting visit / findings.   Tye Savoy , NP 11/09/2019, 1:42 PM

## 2019-11-09 NOTE — Patient Instructions (Addendum)
If you are age 76 or older, your body mass index should be between 23-30. Your Body mass index is 31.76 kg/m. If this is out of the aforementioned range listed, please consider follow up with your Primary Care Provider.  If you are age 36 or younger, your body mass index should be between 19-25. Your Body mass index is 31.76 kg/m. If this is out of the aformentioned range listed, please consider follow up with your Primary Care Provider.   Your provider has requested that you go to the basement level for lab work before leaving today. Press "B" on the elevator. The lab is located at the first door on the left as you exit the elevator. Urinalysis   You have been given sample of IBgard.  If this helps, then you can purchase over-the counter.  If still having abdominal pain call us.  Follow up with Dr. Fuller Plan on 12/19/19 at 2:10 pm.  Thank you for choosing me and [facility].   Tye Savoy, NP

## 2019-11-13 ENCOUNTER — Other Ambulatory Visit: Payer: Self-pay

## 2019-11-13 DIAGNOSIS — R300 Dysuria: Secondary | ICD-10-CM

## 2019-11-14 ENCOUNTER — Encounter: Payer: Self-pay | Admitting: Nurse Practitioner

## 2019-11-14 ENCOUNTER — Other Ambulatory Visit: Payer: Medicare Other

## 2019-11-14 DIAGNOSIS — R300 Dysuria: Secondary | ICD-10-CM

## 2019-11-14 NOTE — Progress Notes (Signed)
Reviewed and agree with management plan. Consider breath test to evaluate for for SIBO, IMO.  Pricilla Riffle. Fuller Plan, MD [facility]

## 2019-11-16 ENCOUNTER — Telehealth: Payer: Self-pay | Admitting: Nurse Practitioner

## 2019-11-16 NOTE — Telephone Encounter (Signed)
Patient calling for urine culture results

## 2019-11-16 NOTE — Telephone Encounter (Signed)
See labs. Thanks

## 2019-11-17 LAB — URINE CULTURE
MICRO NUMBER:: 10230703
SPECIMEN QUALITY:: ADEQUATE

## 2019-11-20 ENCOUNTER — Telehealth: Payer: Self-pay

## 2019-11-20 NOTE — Telephone Encounter (Signed)
Results of the urine culture from 11/14/19 faxed to Dr Crist Infante per Tye Savoy, NP.

## 2019-11-22 NOTE — Telephone Encounter (Signed)
Left a message for the patient to call me. Checking to see if she has heard from her PCP with any recommendations on the urine culture results.

## 2019-11-27 ENCOUNTER — Telehealth: Payer: Self-pay | Admitting: Cardiovascular Disease

## 2019-11-27 NOTE — Telephone Encounter (Signed)
Patient states she is requesting to speak with a pharmacist specifically in regards to whether or not she is still eligible to take cholesterol medication injection due to having had the [employer] vaccination. Please advise.

## 2019-11-28 NOTE — Telephone Encounter (Signed)
Talked to patient. She received Ruth [name] & Ruth [name] vaccine 11/19/2019 without issues.  Okay to take Praluent tomorrow as recommend.

## 2019-11-28 NOTE — Telephone Encounter (Signed)
Follow Up:    Pt called back, Raquel says she will call pt back later today, Pt can take the Repatha shot.

## 2019-11-29 NOTE — Telephone Encounter (Signed)
Beth, Dr. Haynes Kerns sent me a fax regarding the urine culture.  He would not treat as this organism is generally from the skin and not pathogenic.  Thanks

## 2019-12-19 ENCOUNTER — Encounter: Payer: Self-pay | Admitting: Gastroenterology

## 2019-12-19 ENCOUNTER — Ambulatory Visit: Payer: Medicare Other | Admitting: Gastroenterology

## 2019-12-19 VITALS — BP 180/78 | HR 75 | Temp 98.2°F | Ht 63.5 in | Wt 187.0 lb

## 2019-12-19 DIAGNOSIS — R1012 Left upper quadrant pain: Secondary | ICD-10-CM | POA: Diagnosis not present

## 2019-12-19 DIAGNOSIS — R140 Abdominal distension (gaseous): Secondary | ICD-10-CM | POA: Diagnosis not present

## 2019-12-19 DIAGNOSIS — G8929 Other chronic pain: Secondary | ICD-10-CM | POA: Diagnosis not present

## 2019-12-19 MED ORDER — AMOXICILLIN 500 MG PO CAPS: 500.0000 mg | ORAL_CAPSULE | Freq: Three times a day (TID) | ORAL | 0 refills | Status: AC

## 2019-12-19 NOTE — Progress Notes (Signed)
    History of Present Illness: This is a 74 year old female returning for follow-up of chronic left upper quadrant pain, chronic abdominal bloating, alternating diarrhea and constipation.  Refer to November 09, 2019 office note from Tye Savoy, NP.  Patient did not try dicyclomine as she was concerned about side effects.  She saw her PCP, Dr. Crist Infante, who prescribed a course of amoxicillin for possible diverticulitis and her diarrhea, abdominal bloating and left upper quadrant pain all substantially improved.  She completed antibiotics about 4 days ago.  Current Medications, Allergies, Past Medical History, Past Surgical History, Family History and Social History were reviewed in [employer] record.   Physical Exam: General: Well developed, well nourished, no acute distress Head: Normocephalic and atraumatic Eyes:  sclerae anicteric, EOMI Ears: Normal auditory acuity Mouth: Not examined, mask on during Covid-19 pandemic Lungs: Clear throughout to auscultation Heart: Regular rate and rhythm; no murmurs, rubs or bruits Abdomen: Soft, mild left upper quadrant tenderness and non distended. No masses, hepatosplenomegaly or hernias noted. Normal Bowel sounds Rectal: Not done  Musculoskeletal: Symmetrical with no gross deformities  Pulses:  Normal pulses noted Extremities: No clubbing, cyanosis, edema or deformities noted Neurological: Alert oriented x 4, grossly nonfocal Psychological:  Alert and cooperative. Normal mood and affect   Assessment and Recommendations:  1.  Chronic left upper quadrant pain, chronic abdominal bloating, intermittent loose stools.  Given response to amoxicillin suspect diverticulitis and/or SIBO.  Complete 1 more week of amoxicillin 500 mg 3 times daily as a 2-week course for suspected SIBO.  IBS with alternating bowel pattern.  If left upper quadrant pain and bloating return recommend she consider trying dicyclomine 10 mg twice daily as  needed.  REV in 3 months.   2.  Hepatic steatosis.  3.  Multiple drug allergies, sensitivities.   4.  Personal history of adenomatous colon polyps.  Overdue for surveillance colonoscopy.  Cardiac stent placed in December maintained on Effient.  Due to comorbities and age will cancel future surveillance colonoscopies.

## 2019-12-19 NOTE — Patient Instructions (Signed)
We have sent the following medications to your pharmacy for you to pick up at your convenience: amoxicillin.   If you continue to have abdominal pain, then get your dicyclomine filled.   Thank you for choosing me and [facility].  Pricilla Riffle. Dagoberto Ligas., MD., [facility] Regal

## 2020-01-17 DIAGNOSIS — R300 Dysuria: Secondary | ICD-10-CM | POA: Insufficient documentation

## 2020-03-05 ENCOUNTER — Other Ambulatory Visit: Payer: Self-pay | Admitting: Cardiovascular Disease

## 2020-04-08 ENCOUNTER — Ambulatory Visit: Payer: Medicare Other | Admitting: General Practice

## 2020-05-02 ENCOUNTER — Ambulatory Visit: Payer: Medicare Other | Admitting: Cardiovascular Disease

## 2020-07-12 ENCOUNTER — Telehealth: Payer: Self-pay | Admitting: Cardiovascular Disease

## 2020-07-12 NOTE — Telephone Encounter (Signed)
Pt c/o medication issue:  1. Name of Medication: Alirocumab ([place]) 75 MG/ML SOAJ  2. How are you currently taking this medication (dosage and times per day)? Pt gave herself her last shot last week  3. Are you having a reaction (difficulty breathing--STAT)? no  4. What is your medication issue? Cost Pt went to the pharmacy to pick up her medication and the pharmacy wanted to charge her $128. She said Raquel helped her fill out some forms so she can get her medication for free. She needs her help again to fill out some of the forms again

## 2020-07-12 NOTE — Telephone Encounter (Signed)
Will forward to Pharm D.

## 2020-07-15 NOTE — Telephone Encounter (Signed)
Called and spoke w/pt and explained the [employer] lost funding and sample placed

## 2020-07-22 ENCOUNTER — Encounter: Payer: Self-pay | Admitting: Nurse Practitioner

## 2020-07-22 ENCOUNTER — Ambulatory Visit: Payer: Medicare Other | Admitting: Nurse Practitioner

## 2020-07-22 VITALS — BP 142/80 | HR 72 | Ht 61.5 in | Wt 186.0 lb

## 2020-07-22 DIAGNOSIS — K644 Residual hemorrhoidal skin tags: Secondary | ICD-10-CM | POA: Diagnosis not present

## 2020-07-22 DIAGNOSIS — K625 Hemorrhage of anus and rectum: Secondary | ICD-10-CM | POA: Diagnosis not present

## 2020-07-22 NOTE — Progress Notes (Signed)
ASSESSMENT AND PLAN    # 74 yo female with chronic left sided abdominal pain , bloating, intermittent loose stools with leakage. Bloating and diarrhea improved after starting Ruth [name] Probiotics three days ago.  Seems unlikely that she would see any improvement this soon but happy for her.   # Painless rectal bleeding on Effient. Large fleshy hemorrhoid tags and protruding internal hemorrhoids on exam, probably source of bleeding. . No bleeding in two days.  --Hx of adenomatous colon polyps. Due to age /  comorbidities she is no longer under polyp surveillance I explained that diagnostic colonoscopies can still be necessary sometimes but she does not want to pursue it. No bleeding in a couple of daysDespite Effient and it is possible that she may continue to bleed.  --She declined steroid cream for hemorrhoids due to concern for allergic reaction. She can't use Prep H due to some issue with a reaction to its smell.   # Multiple drug allergies / sensitivities  # Recurrent UTI over last 4 months. Scheduled for cystoscopy on Friday.   # CAD / stent in 2020. On Effient  HISTORY OF PRESENT ILLNESS     Primary Gastroenterologist :  Ruth Edward, MD  Chief Complaint : rectal bleeding  Ruth [name] is a 74 y.o. female with PMH / [place] significant for,  but not necessarily limited to: CAD/CABG, hypertension, hyperlipidemia (intolerant to statins) chronic abdominal pain, obesity, diabetes   Ruth [name] has had recurrent UTIs for for months. Scheduled for a cystoscopy on Friday.   Ruth [name] has chronic intermittent loose stools with fecal leakage, bloating and chronic left sided abdominal pain. She started [place] probiotics three days ago and says she already feels less bloated and is having less frequent BMs.  She is here out of concern for recent rectal bleeding. Ruth [name] had blood in her stool last week and again a couple of days ago.   Previous Endoscopic Evaluations / Pertinent Studies:    January 2015 colonoscopy for evaluation of rectal bleeding, diarrhea.  stool leakage.  -- Exam was complete with a good prep.  -- polypoid shaped sessile polyp 3 to 5 mm in size was removed from the cecum.  --Moderate diverticulosis throughout the entire colon with associated tortuosity, muscular hypertrophy, colonic narrowing and angulation.   --Decreased rectal sphincter tone.  Macerated mucosa within the anal canal  **Path c/w a tubular adenoma  Past Medical History:  Diagnosis Date  . Anxiety   . CAD (coronary artery disease)    a. CABG x 4 in 2006 in [place] (LIMA->LAD, VG->Diag, VG->OM, VG->RCA); b. DES to midbody of SVG-PDA/PLA 07/2012; c. 03/2015 Myoview: EF 65%, small defect of mild severit in basal inferolateral wall, low risk.  . Complication of anesthesia    "quit breathing when I got ?Inovar" (07/28/2012)  . Depression   . Diverticulosis   . Dyslipidemia    Intolerant of statins  . Fecal incontinence   . GERD (gastroesophageal reflux disease)   . Hyperlipidemia   . Hyperplastic colon polyp   . IBS (irritable bowel syndrome)   . Labile hypertension    a. 09/2016 Renal Artery duplex: nl renal arteries.  . Migraines    "had them in my 30's" (07/28/2012)  . Multiple allergies   . Obesity   . Steatohepatitis   . Type II diabetes mellitus (HCC)    a. no meds, diet controlled - takes cinnamon.    Current Medications, Allergies, Past Surgical History, Family History and Social  History were reviewed in [facility] record.   Current Outpatient Medications  Medication Sig Dispense Refill  . Alirocumab (PRALUENT) 75 MG/ML SOAJ Inject 75 mg into the skin every 14 (fourteen) days. 2 pen 11  . amLODipine (NORVASC) 5 MG tablet Take 1 tablet (5 mg total) by mouth daily. 90 tablet 3  . aspirin EC 81 MG tablet Take 81 mg by mouth daily.    . calcium carbonate (TUMS - DOSED IN MG ELEMENTAL CALCIUM) 500 MG chewable tablet Chew 1-2 tablets by mouth 3  (three) times daily as needed for heartburn.     . Cephalexin 500 MG tablet Take 500 mg by mouth 3 (three) times daily.    . cholecalciferol (VITAMIN D) 1000 UNITS tablet Take 1,000 Units by mouth daily.    Marland Kitchen CRANBERRY-VITAMIN C PO Take 1 tablet by mouth daily.    . Insulin Glargine (TOUJEO MAX SOLOSTAR [place]) Inject 50 Units into the skin daily.     . Magnesium 250 MG TABS Take 250 mg by mouth daily.    . metoprolol tartrate (LOPRESSOR) 100 MG tablet TAKE 1 TABLET(100 MG) BY MOUTH TWICE DAILY 180 tablet 1  . nitroGLYCERIN (NITROSTAT) 0.4 MG SL tablet Place 1 tablet (0.4 mg total) under the tongue every 5 (five) minutes as needed for chest pain (up to 3 doses). 25 tablet 2  . prasugrel (EFFIENT) 10 MG TABS tablet TAKE 1 TABLET(10 MG) BY MOUTH DAILY 90 tablet 0  . Probiotic Product (PROBIOTIC-10 PO) Take by mouth.    . Simethicone (GAS RELIEF 80 PO) Take 80 mg by mouth 3 (three) times daily as needed (gas).    . valsartan (DIOVAN) 160 MG tablet TAKE 1 TABLET(160 MG) BY MOUTH TWICE DAILY (Patient taking differently: Take 160 mg by mouth 2 (two) times daily. ) 180 tablet 3  . vitamin B-12 (CYANOCOBALAMIN) 1000 MCG tablet Take 1,000 mcg by mouth daily.     No current facility-administered medications for this visit.    Review of Systems: No chest pain. No shortness of breath. No urinary complaints.   PHYSICAL EXAM :    Wt Readings from Last 3 Encounters:  07/22/20 186 lb (84.4 kg)  12/19/19 187 lb (84.8 kg)  11/09/19 185 lb (83.9 kg)    BP (!) 142/80   Pulse 72   Ht 5' 3.5" (1.613 m)   Wt 186 lb (84.4 kg)   BMI 32.43 kg/m  Constitutional:  Pleasant female in no acute distress. Psychiatric: Normal mood and affect. Behavior is normal. EENT: Pupils normal.  Conjunctivae are normal. No scleral icterus. Neck supple.  Cardiovascular: Normal rate, regular rhythm. No edema Pulmonary/chest: Effort normal and breath sounds normal. No wheezing, rales or rhonchi. Abdominal: Soft, nondistended,  nontender. Bowel sounds active throughout. There are no masses palpable. No hepatomegaly. Rectal: large external hemorrhoid tags. A few swollen hemorrhoids just inside anus. Poor sphincter tone.  Neurological: Alert and oriented to person place and time. Skin: Skin is warm and dry. No rashes noted.  Tye Savoy, NP  07/22/2020, 2:04 PM  Cc:  Referring Provider Crist Infante, MD

## 2020-07-22 NOTE — Patient Instructions (Addendum)
If you are age 76 or older, your body mass index should be between 23-30. Your Body mass index is 32.43 kg/m. If this is out of the aforementioned range listed, please consider follow up with your Primary Care Provider.  If you are age 48 or younger, your body mass index should be between 19-25. Your Body mass index is 32.43 kg/m. If this is out of the aformentioned range listed, please consider follow up with your Primary Care Provider.   It has been recommended to you by your physician that you have a(n) Colonoscopy completed. Per your request, we did not schedule the procedure(s) today.   Please call office if you have recurrent rectal bleeding.   Thank you for choosing me and [facility].  [place]

## 2020-07-23 ENCOUNTER — Encounter: Payer: Self-pay | Admitting: Nurse Practitioner

## 2020-07-24 ENCOUNTER — Other Ambulatory Visit: Payer: Self-pay

## 2020-07-24 ENCOUNTER — Encounter: Payer: Self-pay | Admitting: Cardiovascular Disease

## 2020-07-24 ENCOUNTER — Ambulatory Visit: Payer: Medicare Other | Admitting: Cardiovascular Disease

## 2020-07-24 VITALS — BP 152/94 | HR 63 | Ht 61.5 in | Wt 188.2 lb

## 2020-07-24 DIAGNOSIS — E785 Hyperlipidemia, unspecified: Secondary | ICD-10-CM

## 2020-07-24 DIAGNOSIS — Z789 Other specified health status: Secondary | ICD-10-CM | POA: Diagnosis not present

## 2020-07-24 DIAGNOSIS — I10 Essential (primary) hypertension: Secondary | ICD-10-CM | POA: Diagnosis not present

## 2020-07-24 DIAGNOSIS — E669 Obesity, unspecified: Secondary | ICD-10-CM

## 2020-07-24 DIAGNOSIS — I2510 Atherosclerotic heart disease of native coronary artery without angina pectoris: Secondary | ICD-10-CM

## 2020-07-24 DIAGNOSIS — Z79899 Other long term (current) drug therapy: Secondary | ICD-10-CM

## 2020-07-24 DIAGNOSIS — K5790 Diverticulosis of intestine, part unspecified, without perforation or abscess without bleeding: Secondary | ICD-10-CM

## 2020-07-24 NOTE — Progress Notes (Signed)
Reviewed and agree with management plan.  Ahmir Bracken T. Lachlan Mckim, MD FACG [facility]  

## 2020-07-24 NOTE — Progress Notes (Signed)
Patient ID: CATALAYA GARR, female   DOB: [birth year], 74 y.o.   MRN: [id number]   Primary M.D.: Dr. Crist Infante  HPI: Ms. Ronesha Heenan is a 74 year old female who presents for a 9 month follow-up cardiology evaluation.  Ms Orser has a history of coronary artery disease.  While living in [place] she developed chest discomfort and in 10-27-2004 underwent CABG surgery 4 at the [facility] in [place] by Dr. Marzetta Merino.  She had a LIMA to LAD, SVG to diagonal, SVG to obtuse marginal, and SVG to the RCA.   Her husband died in 2010/10/27 and she moved to the [place] area.  In 28-Oct-2011, she experienced symptoms of increasing shortness of breath.  She underwent cardiac catheterization and a stent was placed in the vein graft to her RCA.  She states that she has not had any subsequent stress testing.  She is a former patient of Dr. Einar Gip and an echocardiogram in July 2014 showed normal LV size and function with mild concentric left ventricular hypertrophy.  She had mild to moderate mitral regurgitation, mild tricuspid regurgitation, and mild primary hypertension with an estimated PA pressure 39 mm.  She has a history of hypertension for at least 15-20 years.  She states her blood pressure at home typically is in the 145-150 range, but her blood pressure is always elevated when she goes to the doctor's office.  She has a history of hyperlipidemia and apparently has been intolerant to statins but has never tried Zetia.  There is a history of diabetes mellitus but is not on therapy and states this is diet controlled.    She admits to  increased stress.  Her mother died in November 25, 2014 and recently her dog died.  She does not routinely exercise.  When I initially saw her in June 2016 she has been taking amlodipine 5 mg, losartan 50 mg twice a day and metoprolol 50 mrem twice a day for hypertension and CAD.  She has a history of multiple allergies.  She states that she is Intolerant to amlodipine as  well as Spironolactone.  She has been having labile blood pressure.  In the past. She became dehydrated on diarrhetic therapy.    An echo Doppler study on 03/13/2015  showed an ejection fraction at 55-60%.  There was mild mitral regurgitation, mild left atrial dilatation, and very mild pulmonary hypertension with an estimated PA pressure 32 mm.  A nuclear perfusion study done on 03/21/2015 was low risk and demonstrated a very small region of mild ischemia in the basal anterolateral wall.  She had normal LV function with an EF of 65%.  She developed recurrent episodes of chest pain in June 2018 which  led to her undergoing definitive cardiac catheterization on 03/01/2017.  She was found to have preserved global LV contractility with an EF of 55-60%.  There was significant multivessel CAD with 70% diffuse proximal LAD stenoses, total occlusion of the circumflex and OM1 proximally, and 75% proximal RCA stenosis with occlusion of the RCA prior to the acute margin.  She had a patent LIMA graft supplying the mid LAD, which may also anastomose into the diagonal vessel, but this was uncertain.  The vein graft supplying the distal marginal circumflex branch had smooth 60% proximal to mid body stenosis with TIMI-3 flow.  The vein graft supplying the distal RCA had focal 60% stenosis followed by 99% thrombotic stenosis beyond a stented segment.  She underwent successful PCI to this vein graft to  the RCA with insertion of a 3.532 mm Synergy stent postdilated to 3.71 mm with a Radiation protection practitioner used for distal graft protection.  It was recommended that she continue to antiplatelet therapy indefinitely.  Ms. Holsclaw has significant allergies to multiple medications.  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  She has been on losartan 50 Milligan grams twice a day, metoprolol 100 mg twice a day, and continues to take  aspirin 81 mg and Effient 10 mg.  She did not tolerate Plavix or Brilinta.  She states that she is intolerant to statins.  She has no interest in PCSK9 inhibition.    Since I saw her in October 2018, she was seen in the office at least 5 times by extenders as well as pharmacists.  She is intolerant to numerous medications.  She last saw Nehemiah Massed, [facility] on December 02, 2017 who suggested the possibility of minoxidil but she continued to deny this medication change.  She states most recently her blood pressure at home has been ranging from 370-488 systolically and 89-16 diastolically.  She had called the office stating that she was having pain running through the area where she had a stent.  This was relieved with "water and vinegar which she drinks for gas.   When I saw her in May 2019, her blood pressure continued to be elevated in the office despite taking amlodipine 2.5 mg (she stated she could not tolerate 5 mg), metoprolol 100 mg twice a day, and she was taking valsartan 160 in the morning and only three quarters of a tablet in the evening.  With her blood pressure elevation I suggested further titration of valsartan to 160 twice daily and added Cardura 1 mg initially at night with plans to titrate to 2 mg.  I recommended follow-up hypertensive clinic evaluations with Joslyn Hy as well as Raquel Joellyn Haff who she has seen on numerous occasions.  He continues to admit to intolerance to numerous medications.  I saw her in February 2020 at which time she was feeling significantly better.   She continued to have blood pressure elevation in the office and told me typically blood pressure at home was ranging around 945 systolically.  Ms. Holcomb was admitted to the hospital with recurrent abdominal pain which is her anginal equivalent.  I recommended repeat definitive cardiac catheterization since prior to her last intervention she experienced the same symptoms which improved  following intervention to her vein graft to RCA.  Diagnostic catheterization was done on June 22, 2019 by Dr. Tamala Julian and she again had developed restenosis within the stent in the vein graft to her RCA.  She also had new stenosis of 90% in the vein graft supplying her circumflex marginal vessel.  Her LIMA to LAD and diagonal remain patent.  She had an occluded native mid RCA and circumflex and high-grade 70% proximal LAD with 90% mid LAD stenosis with an occluded second diagonal.  After much discussion we ultimately recommended PCI and the following day she underwent successful intervention with stenting to the vein graft supplying her circumflex marginal vessel and PTCA for the in-stent restenosis in the vein graft supplying her RCA.  Subsequently, her symptoms have improved.  Of note, during her hospitalization on presentation she had marked hyperlipidemia highly suggestive of familial hyperlipidemia with total cholesterol at 300, LDL cholesterol 224 and triglycerides 183.  In the past she has not been  able to tolerate statins or Zetia.  I recommended initiation of PCSK9 therapy.   She had called the office  with complaints of elevated blood pressure.  Amlodipine was increased to 7.5 mg daily.  She was to continue to take and record blood pressure readings daily.  She again called the office on August 18, 2019 with complaints of a sharp epigastric discomfort that she felt was gas but Gas-X only partially improved her symptoms.  She was w evaluated by Bunnie Domino, NP  in the office on August 21, 2019 for follow-up evaluation.  She was having some frequent burping and gas symptoms.  Her blood pressure was elevated at 158/92.  She had undergone prior abdominal ultrasound which was negative for cholelithiasis.  GI evaluation was recommended.  I  saw her in a telemedicine visit in February 2021.  At that time she felt improved.  Since initiating Praluent, lipid studies had improved from an LDL of 224   To 123 on September 29, 2019.  I mentioned to her that she was only on the 75 mg dose of Praluent and my recommendation would be to titrate this to the 150 mg dose.  She also continued to have blood pressure lability and was adamant about increasing medications.  I last saw her in a telemedicine visit in February 2021.  Her blood pressure continued to be elevated and I recommended she increase amlodipine to 5 mg.  Her LDL cholesterol was improved but remained elevated at 123 and I again recommended increasing Praluent to 150 mg rather than the reduced dose of 75 mg every 2 weeks.  Over the past year, she has had issues with diverticulitis and irritable bowel syndrome.  She also has had urinary issues and is undergoing cystoscopy by Dr. Vikki Ports.  She was in need for Praluent and we had obtained a sample of her next dose.  She denies any recurrent chest pain.  She presents for evaluation.      Past Medical History:  Diagnosis Date  . Anxiety   . CAD (coronary artery disease)    a. CABG x 4 in 2006 in [place] (LIMA->LAD, VG->Diag, VG->OM, VG->RCA); b. DES to midbody of SVG-PDA/PLA 07/2012; c. 03/2015 Myoview: EF 65%, small defect of mild severit in basal inferolateral wall, low risk.  . Complication of anesthesia    "quit breathing when I got ?[place]" (07/28/2012)  . Depression   . Diverticulosis   . Dyslipidemia    Intolerant of statins  . Fecal incontinence   . GERD (gastroesophageal reflux disease)   . Hyperlipidemia   . Hyperplastic colon polyp   . IBS (irritable bowel syndrome)   . Labile hypertension    a. 09/2016 Renal Artery duplex: nl renal arteries.  . Migraines    "had them in my 30's" (07/28/2012)  . Multiple allergies   . Obesity   . Steatohepatitis   . Type II diabetes mellitus (HCC)    a. no meds, diet controlled - takes cinnamon.    Past Surgical History:  Procedure Laterality Date  . ABDOMINAL HYSTERECTOMY  1970's  . BREAST LUMPECTOMY     bilateral  .  CARDIAC CATHETERIZATION  2006  . CORONARY ANGIOPLASTY WITH STENT PLACEMENT  07/28/2012   "1; first time for me" (07/28/2012)  . CORONARY ARTERY BYPASS GRAFT  2006   CABG X4  . CORONARY STENT INTERVENTION N/A 03/01/2017   Procedure: Coronary Stent Intervention;  Surgeon: Troy Sine, MD;  Location: [facility];  Service: Cardiovascular;  Laterality: N/A;  . CORONARY STENT INTERVENTION N/A 06/23/2019   Procedure: CORONARY STENT INTERVENTION;  Surgeon: Martinique, Peter M, MD;  Location: [facility];  Service: Cardiovascular;  Laterality: N/A;  . EXCISIONAL HEMORRHOIDECTOMY  1970's  . INGUINAL HERNIA REPAIR  1970's?   left  . LEFT HEART CATH AND CORS/GRAFTS ANGIOGRAPHY N/A 03/01/2017   Procedure: Left Heart Cath and Cors/Grafts Angiography;  Surgeon: Troy Sine, MD;  Location: [facility];  Service: Cardiovascular;  Laterality: N/A;  . LEFT HEART CATH AND CORS/GRAFTS ANGIOGRAPHY N/A 06/22/2019   Procedure: LEFT HEART CATH AND CORS/GRAFTS ANGIOGRAPHY;  Surgeon: Belva Crome, MD;  Location: [facility];  Service: Cardiovascular;  Laterality: N/A;  . LEFT HEART CATHETERIZATION WITH CORONARY/GRAFT ANGIOGRAM N/A 07/28/2012   Procedure: LEFT HEART CATHETERIZATION WITH Beatrix Fetters;  Surgeon: Burnell Blanks, MD;  Location: [facility] C[facility] LAB;  Service: Cardiovascular;  Laterality: N/A;  . PERCUTANEOUS CORONARY STENT INTERVENTION (PCI-S)  07/28/2012   Procedure: PERCUTANEOUS CORONARY STENT INTERVENTION (PCI-S);  Surgeon: Burnell Blanks, MD;  Location: [facility] CATH LAB;  Service: Cardiovascular;;  . TONSILLECTOMY AND ADENOIDECTOMY  ~ 1951    Allergies  Allergen Reactions  . Betadine [Povidone Iodine] Shortness Of Breath and Swelling  . Codeine Anaphylaxis and Shortness Of Breath    "quit breathing" (07/28/2012) Tolerates 1-2 shots of morphine  . Demerol Anaphylaxis    "quit breathing" (07/28/2012)  . Iohexol Anaphylaxis    Finger/ankle swelling  .  Latex Anaphylaxis    "quit breathing" (07/28/2012)  . Other Anaphylaxis    Perfume, Any Fragrance. Cleaning Fluids.  "quit breathing" (07/28/2012)  . Percocet [Oxycodone-Acetaminophen] Anaphylaxis    "quit breathing; disoriented" (07/28/2012)  . Plavix [Clopidogrel Bisulfate] Anaphylaxis    "get hot; like I'm burning up inside; had to put me in shower after OHS because of that" (07/28/2012)  . Red Dye Anaphylaxis    "quit breathing" (07/28/2012)  . Shellfish Allergy Shortness Of Breath    "broke out in knots all over" (07/28/2012)  . Sulfonamide Derivatives Shortness Of Breath and Rash    "quit breathing" (07/28/2012)  . Tylenol [Acetaminophen] Shortness Of Breath and Itching  . Pravastatin Sodium Other (See Comments)    Leg pain, problems ambulating   . Statins     Muscle pain  . Imdur [Isosorbide Nitrate] Other (See Comments)    Intolerance. "Can't remember the reaction."    Current Outpatient Medications  Medication Sig Dispense Refill  . Alirocumab (PRALUENT) 75 MG/ML SOAJ Inject 75 mg into the skin every 14 (fourteen) days. 2 pen 11  . amLODipine (NORVASC) 2.5 MG tablet Take 2.5 mg by mouth 2 (two) times daily.    Marland Kitchen amLODipine (NORVASC) 5 MG tablet Take 1 tablet (5 mg total) by mouth daily. 90 tablet 3  . aspirin EC 81 MG tablet Take 81 mg by mouth daily.    . BD PEN NEEDLE NANO 2ND GEN 32G X 4 MM MISC USE THREE TIMES DAILY AS DIRECTED WITH HUMALOG AND TOUJEO    . calcium carbonate (TUMS - DOSED IN MG ELEMENTAL CALCIUM) 500 MG chewable tablet Chew 1-2 tablets by mouth 3 (three) times daily as needed for heartburn.     . cholecalciferol (VITAMIN D) 1000 UNITS tablet Take 1,000 Units by mouth daily.    Marland Kitchen CRANBERRY-VITAMIN C PO Take 1 tablet by mouth daily.    Marland Kitchen HUMALOG KWIKPEN 100 UNIT/ML KwikPen SMARTSIG:5 Unit(s) SUB-Q Morning-Evening    . Insulin Glargine (  TOUJEO MAX SOLOSTAR [place]) Inject 50 Units into the skin daily.     . Lancets (ONETOUCH DELICA PLUS STMHDQ22W) MISC USE TO  CHECK BLOOD SUGAR FOUR TIMES DAILY    . Magnesium 250 MG TABS Take 250 mg by mouth daily.    . metoprolol tartrate (LOPRESSOR) 100 MG tablet TAKE 1 TABLET(100 MG) BY MOUTH TWICE DAILY 180 tablet 1  . nitroGLYCERIN (NITROSTAT) 0.4 MG SL tablet Place 1 tablet (0.4 mg total) under the tongue every 5 (five) minutes as needed for chest pain (up to 3 doses). 25 tablet 2  . ONETOUCH VERIO test strip     . prasugrel (EFFIENT) 10 MG TABS tablet TAKE 1 TABLET(10 MG) BY MOUTH DAILY 90 tablet 0  . Probiotic Product (PROBIOTIC-10 PO) Take by mouth.    . psyllium (METAMUCIL) 58.6 % packet Take 1 packet by mouth daily.    . valsartan (DIOVAN) 160 MG tablet TAKE 1 TABLET(160 MG) BY MOUTH TWICE DAILY (Patient taking differently: Take 160 mg by mouth 2 (two) times daily. ) 180 tablet 3  . vitamin B-12 (CYANOCOBALAMIN) 1000 MCG tablet Take 1,000 mcg by mouth daily.     No current facility-administered medications for this visit.    Social History   Socioeconomic History  . Marital status: Widowed    Spouse name: Not on file  . Number of children: 2  . Years of education: Not on file  . Highest education level: Not on file  Occupational History  . Occupation: retired  Tobacco Use  . Smoking status: Never Smoker  . Smokeless tobacco: Never Used  Vaping Use  . Vaping Use: Never used  Substance and Sexual Activity  . Alcohol use: No    Alcohol/week: 0.0 standard drinks  . Drug use: No  . Sexual activity: Never  Other Topics Concern  . Not on file  Social History Narrative  . Not on file   Social Determinants of Health   Financial Resource Strain:   . Difficulty of Paying Living Expenses: Not on file  Food Insecurity:   . Worried About Charity fundraiser in the Last Year: Not on file  . Ran Out of Food in the Last Year: Not on file  Transportation Needs:   . Lack of Transportation (Medical): Not on file  . Lack of Transportation (Non-Medical): Not on file  Physical Activity:   . Days of  Exercise per Week: Not on file  . Minutes of Exercise per Session: Not on file  Stress:   . Feeling of Stress : Not on file  Social Connections:   . Frequency of Communication with Friends and Family: Not on file  . Frequency of Social Gatherings with Friends and Family: Not on file  . Attends Religious Services: Not on file  . Active Member of Clubs or Organizations: Not on file  . Attends Archivist Meetings: Not on file  . Marital Status: Not on file  Intimate Partner Violence:   . Fear of Current or Ex-Partner: Not on file  . Emotionally Abused: Not on file  . Physically Abused: Not on file  . Sexually Abused: Not on file   Social history is notable that she is widowed.  She has 2 children, ages 52 and 68.  There is no tobacco use.  She does not routinely exercise. She has a son who had undergone CABG surgery and reportedly has a defibrillator and an ejection fraction of 35%.  Family History  Problem Relation Age  of Onset  . Coronary artery disease Father   . Colon cancer Father   . Colon polyps Father   . Heart disease Father   . Stroke Mother   . Hypertension Other   . Breast cancer Other        grandmother  . Diabetes Maternal Grandmother   . Diabetes Paternal Grandmother   . Esophageal cancer Neg Hx   . Rectal cancer Neg Hx   . Stomach cancer Neg Hx    Family history is notable in that her father has heart disease and is 76 years old.  Her mother died at age 58 with a stroke.  She has 2 sisters, ages 74 and 13.  ROS General: Negative; No fevers, chills, or night sweats HEENT: Negative; No changes in vision or hearing, sinus congestion, difficulty swallowing Pulmonary: Negative; No cough, wheezing, shortness of breath, hemoptysis Cardiovascular: See HPI:  GI: Positive for left upper quadrant pain which he describes as "gas."  She has issues with diverticulitis intermittently and irritable bowel syndrome GU: Has had some urinary issues and will be undergoing  a cystoscopy Musculoskeletal: Negative; no myalgias, joint pain, or weakness Hematologic: Negative; no easy bruising, bleeding Endocrine: Positive for diabetes mellitus , which is untreated.  She has refused medications Neuro: Negative; no changes in balance, headaches Skin: Negative; No rashes or skin lesions Psychiatric: Negative; No behavioral problems, depression Sleep: Negative; No snoring,  daytime sleepiness, hypersomnolence, bruxism, restless legs, hypnogognic hallucinations. Other comprehensive 14 point system review is negative   Physical Exam BP (!) 152/94   Pulse 63   Ht 5' 1.5" (1.562 m)   Wt 188 lb 3.2 oz (85.4 kg)   BMI 34.98 kg/m    Repeat blood pressure by me was improved at 132/88  Wt Readings from Last 3 Encounters:  07/24/20 188 lb 3.2 oz (85.4 kg)  07/22/20 186 lb (84.4 kg)  12/19/19 187 lb (84.8 kg)   General: Alert, oriented, no distress.  Skin: normal turgor, no rashes, warm and dry HEENT: Normocephalic, atraumatic. Pupils equal round and reactive to light; sclera anicteric; extraocular muscles intact;  Nose without nasal septal hypertrophy Mouth/Parynx benign; Mallinpatti scale 3 Neck: No JVD, no carotid bruits; normal carotid upstroke Lungs: clear to ausculatation and percussion; no wheezing or rales Chest wall: without tenderness to palpitation Heart: PMI not displaced, RRR, s1 s2 normal, 1/6 systolic murmur, no diastolic murmur, no rubs, gallops, thrills, or heaves Abdomen: soft, nontender; no hepatosplenomehaly, BS+; abdominal aorta nontender and not dilated by palpation. Back: no CVA tenderness Pulses 2+ Musculoskeletal: full range of motion, normal strength, no joint deformities Extremities: no clubbing cyanosis or edema, Homan's sign negative  Neurologic: grossly nonfocal; Cranial nerves grossly wnl Psychologic: Normal mood and affect  ECG (independently read by me): Normal sinus rhythm at 63 bpm.  Normal intervals.  Mild T wave abnormality  in aVL  October 2020 ECG (independently read by me): Normal sinus rhythm 64 bpm.  Nonspecific ST changes.    February 2020 ECG (independently read by me): NSR ay 68; nonspecific lateral T wave abnormality.  Normal intervals.  No ectopy.  May 2019 ECG (independently read by me): Normal sinus rhythm at 66 bpm.  Lateral T wave abnormality in leads I and aVL.  Normal intervals.  October 2018 ECG (independently read by me): Normal sinus rhythm at 66 bpm.  Nondiagnostic T change laterally.  Normal intervals.  No ectopy.  April 2018 ECG (independently read by me): Normal sinus rhythm at 67 bpm.  PR interval 142 ms.  Poor anterior R-wave progression.  Lateral T changes in 1 and L.  December 2017 ECG (independently read by me): Normal sinus rhythm at 70 bpm.  Nonspecific ST changes.  No ectopy.  March 2017 ECG (independently read by me):  Normal sinus rhythm at 70 bpm. Nonspecific ST changes laterally.  03/04/2015 ECG (independently read by me): Normal sinus rhythm at 65 bpm.  Mild T wave abnormality in lead 1 and aVL.  LABS:  BMP Latest Ref Rng & Units 08/21/2019 06/24/2019 06/23/2019  Glucose 65 - 99 mg/dL 125(H) 238(H) 229(H)  BUN 8 - 27 mg/dL _0 Creatinine 0.57 - 1.00 mg/dL 0.74 0.70 0.84  BUN/Creat Ratio 12 - 28 16 - -  Sodium 134 - 144 mmol/L 141 139 137  Potassium 3.5 - 5.2 mmol/L 4.8 3.9 3.9  Chloride 96 - 106 mmol/L 103 106 102  CO2 20 - 29 mmol/L _1 Calcium 8.7 - 10.3 mg/dL 10.0 9.2 9.4     Hepatic Function Latest Ref Rng & Units 08/21/2019 06/21/2019 12/14/2016  Total Protein 6.0 - 8.5 g/dL 7.1 7.9 6.6  Albumin 3.7 - 4.7 g/dL 4.5 4.1 3.9  AST 0 - 40 IU/L 16 33 12  ALT 0 - 32 IU/L _2 Alk Phosphatase 39 - 117 IU/L 75 76 63  Total Bilirubin 0.0 - 1.2 mg/dL 0.6 1.7(H) 0.6  Bilirubin, Direct 0.0 - 0.2 mg/dL - 0.5(H) -    CBC Latest Ref Rng & Units 06/24/2019 06/23/2019 06/22/2019  WBC 4.0 - 10.5 K/uL 11.5(H) 9.5 7.9  Hemoglobin 12.0 - 15.0 g/dL 13.4 14.7  14.9  Hematocrit 36 - 46 % 40.7 44.3 43.8  Platelets 150 - 400 K/uL 263 291 258   Lab Results  Component Value Date   MCV 92.5 06/24/2019   MCV 92.1 06/23/2019   MCV 92.2 06/22/2019    Lab Results  Component Value Date   TSH 1.987 03/19/2015    BNP No results found for: BNP  ProBNP No results found for: PROBNP   Lipid Panel     Component Value Date/Time   CHOL 300 (H) 06/21/2019 1855   TRIG 183 (H) 06/21/2019 1855   HDL 39 (L) 06/21/2019 1855   CHOLHDL 7.7 06/21/2019 1855   VLDL 37 06/21/2019 1855   LDLCALC 224 (H) 06/21/2019 1855   LDLDIRECT 160.9 06/21/2012 0833     RADIOLOGY: No results found.  IMPRESSION:  1. Coronary artery disease involving native coronary artery of native heart without angina pectoris   2. Essential hypertension   3. Hyperlipidemia with target LDL less than 70   4. Statin intolerance   5. Diverticular disease   6. Obesity (BMI 30-39.9)   7. Medication management     ASSESSMENT AND PLAN: Ms. Teja Costen is a 74 year old female who is 15 years status post CABG revascularization surgery 4  at the [facility] in [place], [place] in 2006.Marland Kitchen  She is status post remote DES stenting to the vein graft supplying her RCA territory.  She developed recurrent anginal symptomatology leading to repeat cardiac catheterization on 03/01/2017. 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  Presently, she remains angina free.  We provided her with a sample of Praluent today.  She is now on amlodipine 5 mg,  metoprolol tartrate 100 mg twice a day and valsartan 160 mg daily for hypertension.  Her blood pressure typically is elevated when she comes to the office but on repeat by me today was improved from her initial measurement.  She is followed by Dr. Haynes Kerns for  primary care.  Target LDL is less than 70.  In the past I had recommended she increase Praluent to 150 mg every 2 weeks but apparently she declined.  She continues to be on DAPT with aspirin/Effient.  I called Dr. Vikki Ports today regarding the Effient to see if this needed to be held prior to a her planned cystoscopy.  Typically Effient is held for 7 days prior to any significant procedure.  However he does not plan to do any biopsy and is just a look into her bladder with the scope but he did not feel that he needed to hold any antiplatelet therapy for this procedure.  We discussed increased activity and weight loss.  She will be following up with Dr. Haynes Kerns.  I will see her in 6 months for reevaluation or sooner as needed. Time spent: 25 minutes Troy Sine, MD, [facility]  07/26/2020 11:09 AM

## 2020-07-24 NOTE — Patient Instructions (Signed)
Medication Instructions:  Continue current medications  *If you need a refill on your cardiac medications before your next appointment, please call your pharmacy*   Lab Work: CBC, CMP, TSH and Fasting Lipids  If you have labs (blood work) drawn today and your tests are completely normal, you will receive your results only by:  [place] (if you have MyChart) OR  A paper copy in the mail If you have any lab test that is abnormal or we need to change your treatment, we will call you to review the results.   Testing/Procedures: None ordered   Follow-Up: At [facility], you and your health needs are our priority.  As part of our continuing mission to provide you with exceptional heart care, we have created designated Provider Care Teams.  These Care Teams include your primary Cardiologist (physician) and Advanced Practice Providers (APPs -  Physician Assistants and Nurse Practitioners) who all work together to provide you with the care you need, when you need it.  We recommend signing up for the patient portal called "MyChart".  Sign up information is provided on this After Visit Summary.  MyChart is used to connect with patients for Virtual Visits (Telemedicine).  Patients are able to view lab/test results, encounter notes, upcoming appointments, etc.  Non-urgent messages can be sent to your provider as well.   To learn more about what you can do with MyChart, go to [email or web address].    Your next appointment:   6 month(s)  The format for your next appointment:   In Person  Provider:   You may see [name], MD or one of the following Advanced Practice Providers on your designated Care Team:    Almyra Deforest, PA-C  Fabian Sharp, PA-C or   Roby Lofts, [place]

## 2020-07-26 ENCOUNTER — Encounter: Payer: Self-pay | Admitting: Cardiovascular Disease

## 2020-08-19 ENCOUNTER — Other Ambulatory Visit: Payer: Self-pay | Admitting: Cardiovascular Disease

## 2020-11-18 ENCOUNTER — Other Ambulatory Visit: Payer: Self-pay | Admitting: Cardiovascular Disease

## 2020-12-05 ENCOUNTER — Encounter: Payer: Self-pay | Admitting: Nurse Practitioner

## 2020-12-05 ENCOUNTER — Telehealth: Payer: Self-pay | Admitting: Nurse Practitioner

## 2020-12-05 NOTE — Progress Notes (Signed)
Received a fax from [facility]. Recent non -contrast CT shows uncomplicated milld proximal descending colon diverticulitis. [facility] gave her Augmentin. She may need another colonoscopy, will discuss with Dr. Fuller Plan. I updated PMH to show hx of diverticulitis.

## 2020-12-05 NOTE — Progress Notes (Signed)
Findings c/w diverticulitis on CT AP. Please schedule an office appt with PG or MS to consider colonoscopy.

## 2020-12-06 NOTE — Progress Notes (Signed)
Please schedule patient an appointment with me or Dr. Fuller Plan.  Let patient know that the urology practice sent [place] copy of the CT showing she had diverticulitis.  We need to discuss colonoscopy with her.

## 2020-12-10 ENCOUNTER — Telehealth: Payer: Self-pay | Admitting: Cardiovascular Disease

## 2020-12-10 MED ORDER — AMLODIPINE BESYLATE 5 MG PO TABS: 5.0000 mg | ORAL_TABLET | Freq: Two times a day (BID) | ORAL | 3 refills | Status: DC

## 2020-12-10 NOTE — Telephone Encounter (Signed)
Pt updated with MD's recommendations and state she would prefer to trying amlodipine 5 mg BID. New order placed.

## 2020-12-10 NOTE — Telephone Encounter (Signed)
Patient states her BP has shot up to 233/96 and is very concerned.

## 2020-12-10 NOTE — Telephone Encounter (Signed)
Okay to increase amlodipine to 10 mg daily.  Patient prefers she can take 5 twice a day but typically amlodipine is a once a day drug

## 2020-12-10 NOTE — Telephone Encounter (Signed)
Pt c/o BP issue: STAT if pt c/o blurred vision, one-sided weakness or slurred speech  1. What are your last 5 BP readings? Currently 178/81  2. Are you having any other symptoms (ex. Dizziness, headache, blurred vision, passed out)? dizziness  3. What is your BP issue? 12am 220/100 176/72 193/82 187/83 208/89 212/90  No slurred speech, blurred vision or one sided weakness

## 2020-12-10 NOTE — Telephone Encounter (Signed)
Spoke with pt who report for the past week she's noticed BP roughly runs in the 140's in the morning but high at night. Pt state last night was the highest it's ever been (reading listed below) and requesting if she can increase amlodipine to 5 mg BID. Pt also gave a detailed description of how she take her medication.    Metoprolol 100 mg (6 am) Valsartan 160 mg (12 pm) Amlodipine 2.5 mg (2 pm) Metoprolol 100 mg (5 pm) Amlodipine 2.5 mg  (8 pm) Valsartan 160 mg  (11 pm -12 am)   4/4-11 pm -2 am 220/100 176/72 193/82 187/83 208/89 212/90  4/5 (this morning) 165/76 HR 64 156/72 HR 64 173/79 HR 64

## 2020-12-10 NOTE — Telephone Encounter (Signed)
Spoke with pt who state she checked her BP and it was 233/96. She then took metoprolol and BP decreased to 212/84. Pt report she is currently anxious and experiencing a headache. Nurse advised pt to report to ER for further evaluations. Pt state she does not want to go to the ER and requesting to be seen in the office tomorrow. Pt informed no appointments available and reiterated the importance of being evaluated by ED. Pt became upset and stated, "you guys are no help," then disconnected the line.

## 2020-12-16 ENCOUNTER — Encounter: Payer: Self-pay | Admitting: Nurse Practitioner

## 2020-12-16 ENCOUNTER — Ambulatory Visit: Payer: Medicare Other | Admitting: Nurse Practitioner

## 2020-12-16 VITALS — BP 180/90 | HR 84 | Ht 61.5 in | Wt 189.0 lb

## 2020-12-16 DIAGNOSIS — K5732 Diverticulitis of large intestine without perforation or abscess without bleeding: Secondary | ICD-10-CM | POA: Diagnosis not present

## 2020-12-16 DIAGNOSIS — K625 Hemorrhage of anus and rectum: Secondary | ICD-10-CM

## 2020-12-16 MED ORDER — AMOXICILLIN-POT CLAVULANATE 500-125 MG PO TABS
1.0000 | ORAL_TABLET | Freq: Two times a day (BID) | ORAL | 0 refills | Status: AC
Start: 2020-12-16 — End: 2020-12-21

## 2020-12-16 NOTE — Patient Instructions (Addendum)
If you are age 77 or older, your body mass index should be between 23-30. Your Body mass index is 35.13 kg/m. If this is out of the aforementioned range listed, please consider follow up with your Primary Care Provider.  If you are age 67 or younger, your body mass index should be between 19-25. Your Body mass index is 35.13 kg/m. If this is out of the aformentioned range listed, please consider follow up with your Primary Care Provider.   We have sent in a 5 day supply of Augmentin for you to continue at twice a day.  Once you have seen your Cardiologist in May, call our office to let us know if you have decided to have the Colonoscopy.  Call the office in a week and speak with Hiroki Wint Oris nurse to advise [place] on how you are doing.  Follow up pending at this time.  Thank you for entrusting me with your care and choosing [facility].  Tye Savoy, NP-C

## 2020-12-16 NOTE — Progress Notes (Signed)
ASSESSMENT AND [name]     # 75 yo female with hx of colon polyps ( on last colonoscopy in 2015). Surveillance colonoscopies previously discontinued due to age and co-morbidities. She had left sided diverticulitis on recent non-contrast CT scan [facility]). Since colon cancer may mimic diverticulitis we brought her in to discuss having a colonoscopy  --LLQ pain had resolved with Augmentin x 5 days prescribed by [facility]. Now with recurrent LLQ pain over last two days. Will treat with an additional  5 days of Augmentin 500 mg BID.  --Regarding colonoscopy, we discussed that a colon lesion cannot be excluded but we also discussed that there are risks associated with every procedure. She is worried about tolerating a bowel prep. She is also concerned about recent uncontrolled blood pressure and wants to see Cardiology prior to thinking about a colonoscopy.     --If she is concerned about colonoscopy we could consider repeating CT scan in a few weeks to evaluate for colon lesions ( once diverticulitis has resolved). It would need to be without contrast unless premedicated with prednisone and benadryl given allergy to IV dye. She has some concerns about taking Benadryl as it has caused almost hallucinatory reactions before.   --Call us in a week with a condition update regarding LLQ pain.  --Call us after Cardiology appt the end of May to let us know decision about colonoscopy.   # Rectal bleeding she says is hemorrhoid related. She doesn't want exam. Not currently treating with anything. She can't tolerate steroid cream, says it makes caused rectal swelling but then she isn't sure if it was even steroid cream that caused the reaction --She is willing to try a course of topical steroid cream. Will go with Anusol cream PR and perianal area Q HS x 7 days. She can't tolerate Prep H due to its fragrance    # HTN. BP at home has been up to 233/96. Norvasc recently increased and says it has  helped. She sees Cardiology the end of May  #CAD /cardiac stents.  She is on Effient  # Multiple allergies ( medication, fragrances, dyes)   HISTORY OF PRESENT ILLNESS    Chief Complaint : here to discuss diverticulitis and colonoscopy  Ruth [name] is a 75 y.o. female known to Ruth [name] with a past medical history of DM 2 , hepatic steatosis, obesity , adenomatous colon polyps, anxiety, depression, CAD s/p remote stent / DES 2013 on chronic Effient, hyperlipidemia, hypertension, diverticulitis, chronic abdominal pain, hemorrhoids, chronic loose stool, fecal incontinence.   I received a fax from [facility] the end of March showing patient had uncomplicated left-sided diverticulitis.  [facility] treated her with amoxicillin x 5 days.  Our office called her to come in for follow-up of diverticulitis and discuss colonoscopy  INTERVAL HISTORY: The LLQ pain quickly resolved with antibiotics but over the last couple days she has started having the same non-radiating LLQ pain.  She has not had any bowel changes, stools are chronically loose and she has chronic fecal incontinence.  She does describe large external hemorrhoids and rectal bleeding. No we left about 930 and went to [facility].  No urinary symptoms.  Her appetite is at baseline, weight stable. Ruth [name] is under stress taking care of her son with heart failure.   PREVIOUS ENDOSCOPIC EVALUATIONS / PERTINENT STUDIES:   Colonoscopy 2015 for stool leakage, diarrhea  --complete exam, good prep --Diverticulosis.  Sessile polyp ranging 3 to 5  mm removed from the cecum.  Decreased rectal tone sphincter.  Macerated mucosa within the anal canal.  Random biopsies taken to rule out microscopic colitis.  Perirectal erythema due to stool incontinence Path-cecal polyp was a tubular adenoma.  Random colon biopsies negative for microscopic colitis   Past Medical History:  Diagnosis Date  . Anxiety   . CAD (coronary artery disease)     a. CABG x 4 in 2006 in [place] (LIMA->LAD, VG->Diag, VG->OM, VG->RCA); b. DES to midbody of SVG-PDA/PLA 07/2012; c. 03/2015 Myoview: EF 65%, small defect of mild severit in basal inferolateral wall, low risk.  . Complication of anesthesia    "quit breathing when I got ?[place]" (07/28/2012)  . Depression   . Diverticulitis   . Diverticulosis   . Dyslipidemia    Intolerant of statins  . Fecal incontinence   . GERD (gastroesophageal reflux disease)   . Hyperlipidemia   . Hyperplastic colon polyp   . IBS (irritable bowel syndrome)   . Labile hypertension    a. 09/2016 Renal Artery duplex: nl renal arteries.  . Migraines    "had them in my 30's" (07/28/2012)  . Multiple allergies   . Obesity   . Steatohepatitis   . Type II diabetes mellitus (HCC)    a. no meds, diet controlled - takes cinnamon.    Current Medications, Allergies, Past Surgical History, Family History and Social History were reviewed in [employer] record.   Current Outpatient Medications  Medication Sig Dispense Refill  . amLODipine (NORVASC) 5 MG tablet Take 1 tablet (5 mg total) by mouth 2 (two) times daily. 180 tablet 3  . aspirin EC 81 MG tablet Take 81 mg by mouth daily.    . BD PEN NEEDLE NANO 2ND GEN 32G X 4 MM MISC USE THREE TIMES DAILY AS DIRECTED WITH HUMALOG AND TOUJEO    . calcium carbonate (TUMS - DOSED IN MG ELEMENTAL CALCIUM) 500 MG chewable tablet Chew 1-2 tablets by mouth 3 (three) times daily as needed for heartburn.     . cholecalciferol (VITAMIN D) 1000 UNITS tablet Take 1,000 Units by mouth daily.    Marland Kitchen CRANBERRY-VITAMIN C PO Take 1 tablet by mouth daily.    Marland Kitchen HUMALOG KWIKPEN 100 UNIT/ML KwikPen SMARTSIG:5 Unit(s) SUB-Q Morning-Evening    . Insulin Glargine (TOUJEO MAX SOLOSTAR [place]) Inject 50 Units into the skin daily.     . Lancets (ONETOUCH DELICA PLUS ERDEYC14G) MISC USE TO CHECK BLOOD SUGAR FOUR TIMES DAILY    . Magnesium 250 MG TABS Take 250 mg by mouth daily.    .  metoprolol tartrate (LOPRESSOR) 100 MG tablet TAKE 1 TABLET(100 MG) BY MOUTH TWICE DAILY 180 tablet 1  . nitroGLYCERIN (NITROSTAT) 0.4 MG SL tablet Place 1 tablet (0.4 mg total) under the tongue every 5 (five) minutes as needed for chest pain (up to 3 doses). 25 tablet 2  . ONETOUCH VERIO test strip     . PRALUENT 75 MG/ML SOAJ ADMINISTER 1 ML UNDER THE SKIN EVERY 14 DAYS 2 mL 11  . prasugrel (EFFIENT) 10 MG TABS tablet Take 1 tablet (10 mg total) by mouth daily. 90 tablet 3  . Probiotic Product (PROBIOTIC-10 PO) Take by mouth.    . psyllium (METAMUCIL) 58.6 % packet Take 1 packet by mouth daily.    . valsartan (DIOVAN) 160 MG tablet TAKE 1 TABLET(160 MG) BY MOUTH TWICE DAILY (Patient taking differently: Take 160 mg by mouth 2 (two) times daily. ) 180  tablet 3  . vitamin B-12 (CYANOCOBALAMIN) 1000 MCG tablet Take 1,000 mcg by mouth daily.     No current facility-administered medications for this visit.    Review of Systems: No chest pain. No shortness of breath. No urinary complaints.   PHYSICAL EXAM :    Wt Readings from Last 3 Encounters:  07/24/20 188 lb 3.2 oz (85.4 kg)  07/22/20 186 lb (84.4 kg)  12/19/19 187 lb (84.8 kg)    BP (!) 180/90 (BP Location: Left Arm, Patient Position: Sitting)   Pulse 84   Ht 5' 1.5" (1.562 m)   Wt 189 lb (85.7 kg)   SpO2 96%   BMI 35.13 kg/m  Constitutional:  Pleasant female in no acute distress. Psychiatric: Normal mood and affect. Behavior is normal. EENT: Pupils normal.  Conjunctivae are normal. No scleral icterus. Neck supple.  Cardiovascular: Normal rate, regular rhythm. No edema Pulmonary/chest: Effort normal and breath sounds normal. No wheezing, rales or rhonchi. Abdominal: Soft, nondistended, mild LLQ tenderness.  Bowel sounds active throughout. There are no masses palpable. Neurological: Alert and oriented to person place and time. Skin: Skin is warm and dry. No rashes noted.  I spent 30 minutes total reviewing records, obtaining  history, performing exam, counseling patient and documenting visit / findings.   Tye Savoy, NP  12/16/2020, 8:35 AM

## 2020-12-16 NOTE — Progress Notes (Signed)
Reviewed and agree with management plan.  Pricilla Riffle. Fuller Plan, MD FACG [phone]

## 2020-12-23 ENCOUNTER — Telehealth: Payer: Self-pay | Admitting: Cardiovascular Disease

## 2020-12-23 NOTE — Telephone Encounter (Signed)
PT is calling to get a sooner appt.Advised the pt that the next available is 6/1 but I can put her on the waitlist for her 5/25 appt.Pt requested a telephone note be sent to the nurse to see if she can be woked in.She also states that her colon Dr. 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

## 2020-12-23 NOTE — Telephone Encounter (Signed)
Spoke to pt and moved appointment up to 5/11 with Coletta Memos, NP.

## 2020-12-24 ENCOUNTER — Encounter (HOSPITAL_COMMUNITY): Payer: Self-pay | Admitting: Internal Medicine

## 2020-12-24 ENCOUNTER — Emergency Department (HOSPITAL_COMMUNITY): Payer: Medicare Other

## 2020-12-24 ENCOUNTER — Observation Stay (HOSPITAL_COMMUNITY)
Admission: EM | Admit: 2020-12-24 | Discharge: 2020-12-26 | Disposition: A | Discharge status: Home / Self Care | Payer: Medicare Other | Attending: Family Medicine | Admitting: Family Medicine

## 2020-12-24 ENCOUNTER — Other Ambulatory Visit: Payer: Self-pay

## 2020-12-24 DIAGNOSIS — Z951 Presence of aortocoronary bypass graft: Secondary | ICD-10-CM | POA: Diagnosis not present

## 2020-12-24 DIAGNOSIS — I160 Hypertensive urgency: Secondary | ICD-10-CM | POA: Diagnosis not present

## 2020-12-24 DIAGNOSIS — I10 Essential (primary) hypertension: Secondary | ICD-10-CM | POA: Diagnosis present

## 2020-12-24 DIAGNOSIS — R42 Dizziness and giddiness: Principal | ICD-10-CM

## 2020-12-24 DIAGNOSIS — Z20822 Contact with and (suspected) exposure to covid-19: Secondary | ICD-10-CM | POA: Diagnosis not present

## 2020-12-24 DIAGNOSIS — E669 Obesity, unspecified: Secondary | ICD-10-CM | POA: Diagnosis present

## 2020-12-24 DIAGNOSIS — Z889 Allergy status to unspecified drugs, medicaments and biological substances status: Secondary | ICD-10-CM

## 2020-12-24 DIAGNOSIS — I2510 Atherosclerotic heart disease of native coronary artery without angina pectoris: Secondary | ICD-10-CM | POA: Diagnosis not present

## 2020-12-24 DIAGNOSIS — Z91040 Latex allergy status: Secondary | ICD-10-CM | POA: Insufficient documentation

## 2020-12-24 DIAGNOSIS — E785 Hyperlipidemia, unspecified: Secondary | ICD-10-CM | POA: Diagnosis present

## 2020-12-24 DIAGNOSIS — E119 Type 2 diabetes mellitus without complications: Secondary | ICD-10-CM | POA: Diagnosis not present

## 2020-12-24 DIAGNOSIS — R1084 Generalized abdominal pain: Secondary | ICD-10-CM | POA: Diagnosis present

## 2020-12-24 LAB — CBC WITH DIFFERENTIAL/PLATELET
Abs Immature Granulocytes: 0.06 10*3/uL (ref 0.00–0.07)
Basophils Absolute: 0.1 10*3/uL (ref 0.0–0.1)
Basophils Relative: 1 %
Eosinophils Absolute: 0.2 10*3/uL (ref 0.0–0.5)
Eosinophils Relative: 3 %
HCT: 39.9 % (ref 36.0–46.0)
Hemoglobin: 13 g/dL (ref 12.0–15.0)
Immature Granulocytes: 1 %
Lymphocytes Relative: 14 %
Lymphs Abs: 0.8 10*3/uL (ref 0.7–4.0)
MCH: 30.2 pg (ref 26.0–34.0)
MCHC: 32.6 g/dL (ref 30.0–36.0)
MCV: 92.6 fL (ref 80.0–100.0)
Monocytes Absolute: 0.6 10*3/uL (ref 0.1–1.0)
Monocytes Relative: 9 %
Neutro Abs: 4.5 10*3/uL (ref 1.7–7.7)
Neutrophils Relative %: 72 %
Platelets: 201 10*3/uL (ref 150–400)
RBC: 4.31 MIL/uL (ref 3.87–5.11)
RDW: 12.8 % (ref 11.5–15.5)
WBC: 6.2 10*3/uL (ref 4.0–10.5)
nRBC: 0 % (ref 0.0–0.2)

## 2020-12-24 LAB — URINALYSIS, ROUTINE W REFLEX MICROSCOPIC
Bilirubin Urine: NEGATIVE
Glucose, UA: 150 mg/dL — AB
Hgb urine dipstick: NEGATIVE
Ketones, ur: NEGATIVE mg/dL
Leukocytes,Ua: NEGATIVE
Nitrite: NEGATIVE
Protein, ur: NEGATIVE mg/dL
Specific Gravity, Urine: 1.008 (ref 1.005–1.030)
pH: 8 (ref 5.0–8.0)

## 2020-12-24 LAB — COMPREHENSIVE METABOLIC PANEL
ALT: 13 U/L (ref 0–44)
AST: 18 U/L (ref 15–41)
Albumin: 3.7 g/dL (ref 3.5–5.0)
Alkaline Phosphatase: 60 U/L (ref 38–126)
Anion gap: 12 (ref 5–15)
BUN: 11 mg/dL (ref 8–23)
CO2: 24 mmol/L (ref 22–32)
Calcium: 9.3 mg/dL (ref 8.9–10.3)
Chloride: 102 mmol/L (ref 98–111)
Creatinine, Ser: 0.66 mg/dL (ref 0.44–1.00)
GFR, Estimated: >60 mL/min (ref >60)
Glucose, Bld: 197 mg/dL — ABNORMAL HIGH (ref 70–99)
Potassium: 3.8 mmol/L (ref 3.5–5.1)
Sodium: 138 mmol/L (ref 135–145)
Total Bilirubin: 0.8 mg/dL (ref 0.3–1.2)
Total Protein: 6.8 g/dL (ref 6.5–8.1)

## 2020-12-24 LAB — PROTIME-INR
INR: 1 (ref 0.8–1.2)
Prothrombin Time: 12.7 seconds (ref 11.4–15.2)

## 2020-12-24 LAB — RESP PANEL BY RT-PCR (FLU A&B, COVID) ARPGX2
Influenza A by PCR: NEGATIVE
Influenza B by PCR: NEGATIVE
SARS Coronavirus 2 by RT PCR: NEGATIVE

## 2020-12-24 LAB — GLUCOSE, CAPILLARY
Glucose-Capillary: 178 mg/dL — ABNORMAL HIGH (ref 70–99)
Glucose-Capillary: 161 mg/dL — ABNORMAL HIGH (ref 70–99)
Glucose-Capillary: 162 mg/dL — ABNORMAL HIGH (ref 70–99)

## 2020-12-24 LAB — TROPONIN I (HIGH SENSITIVITY): Troponin I (High Sensitivity): 12 ng/L (ref <18)

## 2020-12-24 LAB — CBG MONITORING, ED: Glucose-Capillary: 176 mg/dL — ABNORMAL HIGH (ref 70–99)

## 2020-12-24 MED ORDER — INSULIN ASPART 100 UNIT/ML ~~LOC~~ SOLN
0.0000 [IU] | Freq: Three times a day (TID) | SUBCUTANEOUS | Status: DC
  Administered 2020-12-25 (×3): 2 [IU] via SUBCUTANEOUS
  Administered 2020-12-26: 3 [IU] via SUBCUTANEOUS

## 2020-12-24 MED ORDER — ENOXAPARIN SODIUM 40 MG/0.4ML ~~LOC~~ SOLN
40.0000 mg | SUBCUTANEOUS | Status: DC
  Filled 2020-12-24 (×2): qty 0.4

## 2020-12-24 MED ORDER — AMOXICILLIN-POT CLAVULANATE 500-125 MG PO TABS
1.0000 | ORAL_TABLET | Freq: Two times a day (BID) | ORAL | Status: AC
  Administered 2020-12-24 (×2): 500 mg via ORAL
  Filled 2020-12-24 (×4): qty 1

## 2020-12-24 MED ORDER — HYDRALAZINE HCL 25 MG PO TABS: 25.0000 mg | ORAL_TABLET | Freq: Three times a day (TID) | ORAL | 1 refills | Status: DC | PRN

## 2020-12-24 MED ORDER — METOPROLOL TARTRATE 100 MG PO TABS
100.0000 mg | ORAL_TABLET | Freq: Two times a day (BID) | ORAL | Status: DC
  Administered 2020-12-24 (×2): 100 mg via ORAL
  Filled 2020-12-24 (×2): qty 1
  Filled 2020-12-24: qty 4

## 2020-12-24 MED ORDER — HYDRALAZINE HCL 25 MG PO TABS
25.0000 mg | ORAL_TABLET | Freq: Three times a day (TID) | ORAL | Status: DC | PRN
  Filled 2020-12-24: qty 1

## 2020-12-24 MED ORDER — AMLODIPINE BESYLATE 5 MG PO TABS
5.0000 mg | ORAL_TABLET | Freq: Two times a day (BID) | ORAL | Status: DC
  Administered 2020-12-24 – 2020-12-26 (×5): 5 mg via ORAL
  Filled 2020-12-24 (×5): qty 1

## 2020-12-24 MED ORDER — IRBESARTAN 150 MG PO TABS
150.0000 mg | ORAL_TABLET | Freq: Two times a day (BID) | ORAL | Status: DC
  Administered 2020-12-24 – 2020-12-26 (×4): 150 mg via ORAL
  Filled 2020-12-24 (×5): qty 1

## 2020-12-24 MED ORDER — CLONIDINE HCL 0.1 MG PO TABS: 0.1000 mg | ORAL_TABLET | Freq: Two times a day (BID) | ORAL | Status: DC | PRN

## 2020-12-24 MED ORDER — PRASUGREL HCL 10 MG PO TABS
10.0000 mg | ORAL_TABLET | Freq: Every day | ORAL | Status: DC
  Administered 2020-12-24 – 2020-12-26 (×3): 10 mg via ORAL
  Filled 2020-12-24 (×3): qty 1

## 2020-12-24 MED ORDER — INSULIN ASPART 100 UNIT/ML ~~LOC~~ SOLN: 0.0000 [IU] | Freq: Every day | SUBCUTANEOUS | Status: DC

## 2020-12-24 MED ORDER — ASPIRIN EC 81 MG PO TBEC
81.0000 mg | DELAYED_RELEASE_TABLET | Freq: Every day | ORAL | Status: DC
  Administered 2020-12-24 – 2020-12-26 (×3): 81 mg via ORAL
  Filled 2020-12-24 (×3): qty 1

## 2020-12-24 MED ORDER — INSULIN GLARGINE 100 UNIT/ML ~~LOC~~ SOLN
50.0000 [IU] | Freq: Every day | SUBCUTANEOUS | Status: DC
  Administered 2020-12-24 – 2020-12-26 (×3): 50 [IU] via SUBCUTANEOUS
  Filled 2020-12-24 (×4): qty 0.5

## 2020-12-24 NOTE — ED Triage Notes (Signed)
EMS arrival from home, per EMS pt woke up dizzy with headache when up to walk dog, BP 200/100 on scene, reporting feeling syncopal. Pt took .29m Clonidine prior to EMS arrival. Per EMS pt BP and anxiety decreased in route.

## 2020-12-24 NOTE — H&P (Signed)
History and Physical    Ruth [name] ZLD:[id number] DOB: Jun 22, 1946 DOA: 12/24/2020  PCP: Crist Infante, MD Consultants:  Claiborne Billings - cardiology; [facility] Plan - GI Patient coming from:  Home - lives with son; NOK: [place], [place], [phone]  Chief Complaint: HTN  HPI: Ruth [name] is a 75 y.o. female with medical history significant of DM; obesity; labile HTN; HLD; CAD s/p CABG; and depression/anxiety presenting with HTN.  She has been having BP issues for 3 weeks - fine in AMs, spikes up at night (as high as 233/100).  She has been trying to get into cardiology without success - has appt on 5/11.  She is dizzy with leaning/standing.  She has fecal incontinence "because my butthole is dead, they already told me that."  She also has DM issues.  For her BP, she takes Norvasc 5 mg BID; Clonidine 0.1 mg BID prn (took single dose last night for the first time); Lopressor 100 mg BID; and valsartan 160 mg BID.  She moved here to help care for her parents, who have since died.  Her son has systolic CHF.  She had a CT with urology and was given Augmentin for an infection; UA today is clear.  Concern for diverticulitis vs. Colon cancer but needed clearance from cards first due to issues with BP - and concern to stop blood thinners with stents.    ED Course:  Carryover, per Dr. Alcario Drought:  75 yo F with high BP, dizziness when dog woke her up from sleep this AM. HTN urgency vs stroke. EDP ordering MRI on patient now.  Review of Systems: As per HPI; otherwise review of systems reviewed and negative.   Ambulatory Status:  Ambulates without assistance, uses a walker at night for balance  COVID Vaccine Status:  Complete - J&J x 1  Past Medical History:  Diagnosis Date  . Anxiety   . CAD (coronary artery disease)    a. CABG x 4 in 2006 in [place] (LIMA->LAD, VG->Diag, VG->OM, VG->RCA); b. DES to midbody of SVG-PDA/PLA 07/2012; c. 03/2015 Myoview: EF 65%, small defect of mild severit in basal  inferolateral wall, low risk.  . Complication of anesthesia    "quit breathing when I got ?[place]" (07/28/2012)  . Depression   . Diverticulitis   . Diverticulosis   . Dyslipidemia    Intolerant of statins  . Fecal incontinence   . GERD (gastroesophageal reflux disease)   . Hyperlipidemia   . Hyperplastic colon polyp   . IBS (irritable bowel syndrome)   . Labile hypertension    a. 09/2016 Renal Artery duplex: nl renal arteries.  . Migraines    "had them in my 30's" (07/28/2012)  . Multiple allergies   . Obesity   . Steatohepatitis   . Type II diabetes mellitus (HCC)    a. no meds, diet controlled - takes cinnamon.    Past Surgical History:  Procedure Laterality Date  . ABDOMINAL HYSTERECTOMY  1970's  . BREAST LUMPECTOMY     bilateral  . CARDIAC CATHETERIZATION  2006  . CORONARY ANGIOPLASTY WITH STENT PLACEMENT  07/28/2012   "1; first time for me" (07/28/2012)  . CORONARY ARTERY BYPASS GRAFT  2006   CABG X4  . CORONARY STENT INTERVENTION N/A 03/01/2017   Procedure: Coronary Stent Intervention;  Surgeon: Troy Sine, MD;  Location: [facility];  Service: Cardiovascular;  Laterality: N/A;  . CORONARY STENT INTERVENTION N/A 06/23/2019   Procedure: CORONARY STENT INTERVENTION;  Surgeon: Martinique, Peter  M, MD;  Location: [facility];  Service: Cardiovascular;  Laterality: N/A;  . EXCISIONAL HEMORRHOIDECTOMY  1970's  . INGUINAL HERNIA REPAIR  1970's?   left  . LEFT HEART CATH AND CORS/GRAFTS ANGIOGRAPHY N/A 03/01/2017   Procedure: Left Heart Cath and Cors/Grafts Angiography;  Surgeon: Troy Sine, MD;  Location: [facility];  Service: Cardiovascular;  Laterality: N/A;  . LEFT HEART CATH AND CORS/GRAFTS ANGIOGRAPHY N/A 06/22/2019   Procedure: LEFT HEART CATH AND CORS/GRAFTS ANGIOGRAPHY;  Surgeon: Belva Crome, MD;  Location: [facility];  Service: Cardiovascular;  Laterality: N/A;  . LEFT HEART CATHETERIZATION WITH CORONARY/GRAFT ANGIOGRAM N/A  07/28/2012   Procedure: LEFT HEART CATHETERIZATION WITH Beatrix Fetters;  Surgeon: Burnell Blanks, MD;  Location: [facility];  Service: Cardiovascular;  Laterality: N/A;  . PERCUTANEOUS CORONARY STENT INTERVENTION (PCI-S)  07/28/2012   Procedure: PERCUTANEOUS CORONARY STENT INTERVENTION (PCI-S);  Surgeon: Burnell Blanks, MD;  Location: [facility];  Service: Cardiovascular;;  . TONSILLECTOMY AND ADENOIDECTOMY  ~ 1951    Social History   Socioeconomic History  . Marital status: Widowed    Spouse name: Not on file  . Number of children: 2  . Years of education: Not on file  . Highest education level: Not on file  Occupational History  . Occupation: retired  Tobacco Use  . Smoking status: Never Smoker  . Smokeless tobacco: Never Used  Vaping Use  . Vaping Use: Never used  Substance and Sexual Activity  . Alcohol use: No    Alcohol/week: 0.0 standard drinks  . Drug use: No  . Sexual activity: Never  Other Topics Concern  . Not on file  Social History Narrative  . Not on file   Social Determinants of Health   Financial Resource Strain: Not on file  Food Insecurity: Not on file  Transportation Needs: Not on file  Physical Activity: Not on file  Stress: Not on file  Social Connections: Not on file  Intimate Partner Violence: Not on file    Allergies  Allergen Reactions  . Betadine [Povidone Iodine] Shortness Of Breath and Swelling  . Codeine Anaphylaxis and Shortness Of Breath    "quit breathing" (07/28/2012) Tolerates 1-2 shots of morphine  . Demerol Anaphylaxis    "quit breathing" (07/28/2012)  . Iohexol Anaphylaxis    Finger/ankle swelling  . Latex Anaphylaxis    "quit breathing" (07/28/2012)  . Other Anaphylaxis    Perfume, Any Fragrance. Cleaning Fluids.  "quit breathing" (07/28/2012)  . Percocet [Oxycodone-Acetaminophen] Anaphylaxis    "quit breathing; disoriented" (07/28/2012)  . Plavix [Clopidogrel Bisulfate] Anaphylaxis    "get  hot; like I'm burning up inside; had to put me in shower after OHS because of that" (07/28/2012)  . Red Dye Anaphylaxis    "quit breathing" (07/28/2012)  . Shellfish Allergy Shortness Of Breath    "broke out in knots all over" (07/28/2012)  . Sulfonamide Derivatives Shortness Of Breath and Rash    "quit breathing" (07/28/2012)  . Tylenol [Acetaminophen] Shortness Of Breath and Itching  . Azilsartan     Other reaction(s): muscle pain  . Azithromycin     Other reaction(s): she cannot take it. felt like she would die. legs would not work , painful and had to get in the shower and had diarrhea.  . Barbiturates     Other reaction(s): SOB  . Cardizem [Diltiazem]     Other reaction(s): took one dose.  felt very sick, bad. said raised bp  more.  . Ciprofloxacin     Other reaction(s): after one does had charlie horses all over her legs and could not go back to sleep  . Coumadin [Warfarin]     Other reaction(s): CAN'T TAKE, GETS HOT/BREAKS OUT  . Crestor [Rosuvastatin]     Other reaction(s): RASH  . Ezetimibe     Other reaction(s): RASH  . Fluogen [Influenza Virus Vaccine]     Other reaction(s): gets very sick and thinks it is the mercury level.  . Furosemide     Other reaction(s): makes her too tired.  . Hydrochlorothiazide     Other reaction(s): broke out with blisters on her mouth  . Omega-3-Acid Ethyl Esters     Other reaction(s): bad diarrhea  . Pravastatin Sodium Other (See Comments)    Leg pain, problems ambulating   . Spironolactone     Other reaction(s): states she doesn't want to take it as it made her dehydrated in the past.  . Statins     Muscle pain  . Sulfa Antibiotics     Other reaction(s): swelling/SOB  . Vancomycin     Other reaction(s): pt states it caused her major abd pain  . Imdur [Isosorbide Nitrate] Other (See Comments)    Intolerance. "Can't remember the reaction."    Family History  Problem Relation Age of Onset  . Coronary artery disease Father   .  Colon cancer Father   . Colon polyps Father   . Heart disease Father   . Stroke Mother   . Hypertension Other   . Breast cancer Other        grandmother  . Diabetes Maternal Grandmother   . Diabetes Paternal Grandmother   . Esophageal cancer Neg Hx   . Rectal cancer Neg Hx   . Stomach cancer Neg Hx     Prior to Admission medications   Medication Sig Start Date End Date Taking? Authorizing Provider  amLODipine (NORVASC) 5 MG tablet Take 1 tablet (5 mg total) by mouth 2 (two) times daily. 12/10/20  Yes Troy Sine, MD  amoxicillin-clavulanate (AUGMENTIN) 500-125 MG tablet Take 1 tablet by mouth See admin instructions. Bid x 5 days   Yes [provider]  aspirin EC 81 MG tablet Take 81 mg by mouth daily.   Yes [provider]  BD PEN NEEDLE NANO 2ND GEN 32G X 4 MM MISC USE THREE TIMES DAILY AS DIRECTED WITH HUMALOG AND TOUJEO 06/04/20  Yes [provider]  calcium carbonate (TUMS - DOSED IN MG ELEMENTAL CALCIUM) 500 MG chewable tablet Chew 1-2 tablets by mouth 3 (three) times daily as needed for heartburn.    Yes [provider]  cholecalciferol (VITAMIN D) 1000 UNITS tablet Take 1,000 Units by mouth daily.   Yes [provider]  cloNIDine (CATAPRES) 0.1 MG tablet Take 0.1 mg by mouth 2 (two) times daily as needed (bp greater than 180/95). 12/12/20  Yes [provider]  CRANBERRY-VITAMIN C PO Take 1 tablet by mouth daily.   Yes [provider]  EPINEPHrine 0.3 mg/0.3 mL IJ SOAJ injection Inject 0.3 mg into the muscle as needed. 10/05/19  Yes [provider]  HUMALOG KWIKPEN 100 UNIT/ML KwikPen Inject 5 Units into the skin in the morning and at bedtime. 06/17/20  Yes [provider]  Insulin Glargine (TOUJEO MAX SOLOSTAR [place]) Inject 50 Units into the skin daily.    Yes [provider]  Lancets (ONETOUCH DELICA PLUS MEQAST41D) [place]  FOUR TIMES DAILY 06/03/20  Yes [provider]  Magnesium 250 MG TABS Take 250 mg by mouth daily.   Yes [provider]  metoprolol tartrate (LOPRESSOR) 100 MG tablet TAKE 1 TABLET(100 MG) BY MOUTH TWICE DAILY Patient taking differently: Take 100 mg by mouth 2 (two) times daily. 03/07/20  Yes Troy Sine, MD  nitrofurantoin, macrocrystal-monohydrate, (MACROBID) 100 MG capsule Take 100 mg by mouth daily.   Yes [provider]  nitroGLYCERIN (NITROSTAT) 0.4 MG SL tablet Place 1 tablet (0.4 mg total) under the tongue every 5 (five) minutes as needed for chest pain (up to 3 doses). 08/21/19  Yes Lendon Colonel, NP  [facility] VERIO test strip  07/23/20  Yes [provider]  PRALUENT 75 MG/ML SOAJ ADMINISTER 1 ML UNDER THE SKIN EVERY 14 DAYS Patient taking differently: Inject 1 mL into the skin See admin instructions. Every 16 days 08/21/20  Yes Troy Sine, MD  prasugrel (EFFIENT) 10 MG TABS tablet Take 1 tablet (10 mg total) by mouth daily. 11/18/20  Yes Troy Sine, MD  Probiotic Product (PROBIOTIC-10 PO) Take 1 capsule by mouth daily.   Yes [provider]  valsartan (DIOVAN) 160 MG tablet TAKE 1 TABLET(160 MG) BY MOUTH TWICE DAILY Patient taking differently: Take 160 mg by mouth 2 (two) times daily. 02/24/19  Yes Troy Sine, MD  vitamin B-12 (CYANOCOBALAMIN) 1000 MCG tablet Take 1,000 mcg by mouth daily.   Yes [provider]    Physical Exam: Vitals:   12/24/20 0955 12/24/20 1000 12/24/20 1143 12/24/20 1414  BP: (!) 173/71 (!) 156/69 (!) 156/66 (!) 153/68  Pulse: 72 72 73 (!) 58  Resp: 13 19 17 15   Temp:      TempSrc:      SpO2: 98% 98% 97% 97%  Weight:      Height:         . General:  Appears calm and comfortable and is in NAD; appears sedentary . Eyes:  PERRL, EOMI, normal lids, iris . ENT:  grossly normal hearing, lips & tongue, mmm . Neck:  no LAD, masses or thyromegaly . Cardiovascular:  RRR, no m/r/g. No LE edema.  Marland Kitchen Respiratory:   CTA bilaterally with no  wheezes/rales/rhonchi.  Normal respiratory effort. . Abdomen:  Soft, diffuse chronic abdominal TTP, ND . Skin:  no rash or induration seen on limited exam . Musculoskeletal:  grossly normal tone BUE/BLE, good ROM, no bony abnormality . Psychiatric:  eccentric mood and affect, speech fluent and appropriate, AOx3 . Neurologic:  CN 2-12 grossly intact, moves all extremities in coordinated fashion    Radiological Exams on Admission: Independently reviewed - see discussion in A/P where applicable  CT Head Wo Contrast  Result Date: 12/24/2020 CLINICAL DATA:  TIA.  Headache and dizziness.  Presyncope EXAM: CT HEAD WITHOUT CONTRAST TECHNIQUE: Contiguous axial images were obtained from the base of the skull through the vertex without intravenous contrast. COMPARISON:  07/26/2010 FINDINGS: Brain: No evidence of acute infarction, hemorrhage, hydrocephalus, extra-axial collection or mass lesion/mass effect. Low-density in the cerebral white matter attributed to chronic small vessel ischemia, overall moderate. Mild for age cerebral volume loss. Senescent changes have progressed from prior. Vascular: Atheromatous calcifications Skull: Normal. Negative for fracture or focal lesion. Sinuses/Orbits: Negative IMPRESSION: 1. No acute or reversible finding. 2. Aging brain with progression from 2011. Electronically Signed   By: Monte Fantasia M.D.   On: 12/24/2020 05:05   MR BRAIN WO CONTRAST  Result  Date: 12/24/2020 CLINICAL DATA:  Awoke with dizziness.  Hypertension. EXAM: MRI HEAD WITHOUT CONTRAST TECHNIQUE: Multiplanar, multiecho pulse sequences of the brain and surrounding structures were obtained without intravenous contrast. COMPARISON:  Head CT from earlier today FINDINGS: Brain: No acute infarction, hemorrhage, hydrocephalus, extra-axial collection or mass lesion. Moderate chronic small vessel ischemia. Cerebral volume loss in keeping with aging. Vascular: Normal flow voids Skull and upper cervical spine:  Normal marrow signal. 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 Sinuses/Orbits: Negative IMPRESSION: 1. No acute or reversible finding. 2. Moderate chronic small vessel ischemia. Electronically Signed   By: Monte Fantasia M.D.   On: 12/24/2020 07:16    EKG: Independently reviewed.  NSR with rate 64; nonspecific ST changes with no evidence of acute ischemia   Labs on Admission: I have personally reviewed the available labs and imaging studies at the time of the admission.  Pertinent labs:   Glucose 197 UA: 150 glucose HS troponin 12   Assessment/Plan Principal Problem:   Dizziness Active Problems:   Coronary artery disease   Hyperlipidemia with target LDL less than 70   Diabetes mellitus (HCC)   Generalized abdominal pain   Obesity (BMI 30-39.9)   HTN (hypertension)   Multiple drug allergies   Dizziness -Patient with known hypertensive lability presenting with the same and dizziness -CT and the MRI obtained and negative -Initially thought to have HTN crisis; however, BP control has improved and patient continues to report dizziness and inability to walk -Plan was for ER d/c to home but given persistent symptoms will observe overnight -PT vestibular evaluation ordered  Multiple drug allergies -She has 30 listed medication allergies -Further, she reports that the fragrance associated with Purell or even soap when hands are not adequately dried before touching her will cause her to anaphylax -As such, in an effort to decrease her risk of unnecessary fragrance exposure, I attempted to have the patient discharged from the ER -Unfortunately, she was not comfortable with d/c due to persistent dizziness -Only Lovenox was added to her home medications -She prefers to take her own medications and this is ok so long as meds are approved by pharmacy first  CAD -s/p CABG -Continue ASA, Prasugrel  Labile HTN -She reports significant issues with  nocturnal hypertension -cardiology was unavailable to see her outpatient prior to scheduled appointment and so Dr. Percival Spanish kindly did an inpatient consultation instead -Continue Norvasc, Lopressor, Diovan as per his dosing instruction -Stop Clonidine  HLD -Continue Praluent  DM -Will check A1c -Continue Toujeo (Lantus formulary substitution) -Cover with moderate-scale SSI  Generalized abdominal pain -Patient reports this is at baseline at this time  Recurrent UTI -UA today appears clean -Complete course of Augmentin and then resume Nitrofurantoin suppressive therapy  Obesity -Body mass index is 34.77 kg/m..  -Weight loss should be encouraged -Outpatient PCP/bariatric medicine f/u encouraged     Note: This patient has been tested and is negative for the novel coronavirus COVID-19. The patient has been fully vaccinated against COVID-19 with a single dose of the [employer] vaccine.   Level of care: Med-Surg DVT prophylaxis: Lovenox Code Status:  Full - confirmed with patient Family Communication: None present Disposition Plan:  The patient is from: home  Anticipated d/c is to: home without [facility] services  Anticipated d/c date will depend on clinical response to treatment, but possibly as early as tomorrow if she has excellent response to treatment  Patient is currently: acutely ill Consults called: PT Admission  status:  It is my clinical opinion that referral for OBSERVATION is reasonable and necessary in this patient based on the above information provided. The aforementioned taken together are felt to place the patient at high risk for further clinical deterioration. However it is anticipated that the patient may be medically stable for discharge from the hospital within 24 to 48 hours.    Karmen Bongo MD [facility]   How to contact the [facility] Attending or Consulting provider [place] or covering provider during after hours [place], for this patient?   1. Check the care team in [facility]) and look for a) attending/consulting TR[facility] provider listed and b) the [facility] team listed 2. Log into [email or web address] and use [place]'s universal password to access. If you do not have the password, please contact the hospital operator. 3. Locate the [facility] provider you are looking for under [facility] and page to a number that you can be directly reached. 4. If you still have difficulty reaching the provider, please page the [facility] (Director on Call) for the Hospitalists listed on amion for assistance.   12/24/2020, 2:29 PM

## 2020-12-24 NOTE — ED Notes (Signed)
Pt c/o increased dizziness. MD aware.

## 2020-12-24 NOTE — ED Notes (Signed)
Attempted to get the patient out of the bed.  Patient sat on the side of the bed and then stood up.  Patient states that she is dizzy and needed to lay back down.

## 2020-12-24 NOTE — Telephone Encounter (Signed)
   Pt called back, she is in [facility] ED and asking if Dr. Claiborne Billings can check on her, she said no one is helping her there and really wants to see Dr. Claiborne Billings if possible

## 2020-12-24 NOTE — ED Notes (Signed)
Food provided per DR. Okay

## 2020-12-24 NOTE — ED Notes (Signed)
Pt returned from MRI. Placed back on cardiac monitor.

## 2020-12-24 NOTE — ED Notes (Signed)
Patient transported to CT via stretcher in stable condition

## 2020-12-24 NOTE — ED Notes (Signed)
Attempted report to IP unit. Charge unavailable.

## 2020-12-24 NOTE — ED Notes (Addendum)
Patient transported to MRI via stretcher

## 2020-12-24 NOTE — Consult Note (Addendum)
Cardiology Consultation:   Patient ID: Ruth [name] MRN: [id number]; DOB: Feb 22, 1946  Admit date: 12/24/2020 Date of Consult: 12/24/2020  PCP:  Crist Infante, [facility]  Cardiologist:  [name], MD   Patient Profile:   Ruth [name] is a 75 y.o. female with a hx of CAD s/p CABG and subsequent PCI, mitral regurgitation, hypertension, depression, hyperlipidemia, diabetes mellitus, IBS and morbid obesity who is being seen today for the evaluation of high blood pressure at the request of Dr. Lorin Mercy.  Patient with history of CAD s/p CABG x4 in 2006 at [facility] (LIMA to LAD, SVG to diagonal, SVG to obtuse marginal, and SVG to the RCA).  Underwent DES stenting to SVG RCA in 2013. She underwent successful PCI in 2018  to high-grade thrombotic lesion in the vein with supplied her right coronary artery with ultimate insertion of a 3.532 mm Synergy stent to cover a long segment of both in-stent and distal stent stenosis.  Patient has remained on dual antiplatelet therapy with aspirin and Effient.  Patient with prior history of elevated blood pressure during office visit.  Normal renal arteries by ultra sound 09/2016. Last seen by Dr. Claiborne Billings November 2021.  History of Present Illness:   Ruth [name] is dealing with elevated blood pressure for past 3 weeks.  However had headache and severe dizziness after taking clonidine first time last night for high blood pressure.  He also reported intermittent blurriness.  Symptoms persisted and eventually required EMS and the patient was found hypertensive and blood pressure of 200/100.  Upon ER arrival her blood pressure improved to 735 systolic.  Patient denies chest pain, shortness of breath, orthopnea, PND or syncope.  Felt like going to pass out when tried to stand up.  He continues to have dizziness.  High-sensitivity troponin Serum creatinine and electrolytes within normal limits CT and MRI of head  without acute finding  Patient reports blood pressure of 140s/60s in the morning and 180s/80s at night around 10 PM.  She reported elevated blood pressure at night for past 3 weeks.  Patient takes care of her son who is heart only works 20%.  Seems underlying anxiety and stress.  Patient takes metoprolol 100 mg at 6 AM and 6 PM, valsartan 160 mg 12 p.m. and 12 AM, and amlodipine 5 mg 2 PM and 2.5 mg (although records indicates he should be taking 5 mg) 10 PM. She was prescribed clonidine by PCP for elevated blood pressure and took first time last night.   She goes to bed around 1AM and wakes up around 5-6AM. She takes nap in afternoon.   She was dealing with diarrhea and diverticulitis 5 days ago and started on Augmentin by GI. Reported improved diarrhea since then.   Past Medical History:  Diagnosis Date  . Anxiety   . CAD (coronary artery disease)    a. CABG x 4 in 2006 in [place] (LIMA->LAD, VG->Diag, VG->OM, VG->RCA); b. DES to midbody of SVG-PDA/PLA 07/2012; c. 03/2015 Myoview: EF 65%, small defect of mild severit in basal inferolateral wall, low risk.  . Complication of anesthesia    "quit breathing when I got ?[place]" (07/28/2012)  . Depression   . Diverticulitis   . Diverticulosis   . Dyslipidemia    Intolerant of statins  . Fecal incontinence   . GERD (gastroesophageal reflux disease)   . Hyperlipidemia   . Hyperplastic colon polyp   . IBS (irritable bowel syndrome)   .  Labile hypertension    a. 09/2016 Renal Artery duplex: nl renal arteries.  . Migraines    "had them in my 30's" (07/28/2012)  . Multiple allergies   . Obesity   . Steatohepatitis   . Type II diabetes mellitus (HCC)    a. no meds, diet controlled - takes cinnamon.    Past Surgical History:  Procedure Laterality Date  . ABDOMINAL HYSTERECTOMY  1970's  . BREAST LUMPECTOMY     bilateral  . CARDIAC CATHETERIZATION  2006  . CORONARY ANGIOPLASTY WITH STENT PLACEMENT  07/28/2012   "1; first time for  me" (07/28/2012)  . CORONARY ARTERY BYPASS GRAFT  2006   CABG X4  . CORONARY STENT INTERVENTION N/A 03/01/2017   Procedure: Coronary Stent Intervention;  Surgeon: Troy Sine, MD;  Location: [place] [facility];  Service: Cardiovascular;  Laterality: N/A;  . CORONARY STENT INTERVENTION N/A 06/23/2019   Procedure: CORONARY STENT INTERVENTION;  Surgeon: Martinique, Peter M, MD;  Location: [place] [facility];  Service: Cardiovascular;  Laterality: N/A;  . EXCISIONAL HEMORRHOIDECTOMY  1970's  . INGUINAL HERNIA REPAIR  1970's?   left  . LEFT HEART CATH AND CORS/GRAFTS ANGIOGRAPHY N/A 03/01/2017   Procedure: Left Heart Cath and Cors/Grafts Angiography;  Surgeon: Troy Sine, MD;  Location: [place] [facility];  Service: Cardiovascular;  Laterality: N/A;  . LEFT HEART CATH AND CORS/GRAFTS ANGIOGRAPHY N/A 06/22/2019   Procedure: LEFT HEART CATH AND CORS/GRAFTS ANGIOGRAPHY;  Surgeon: Belva Crome, MD;  Location: [place] [facility];  Service: Cardiovascular;  Laterality: N/A;  . LEFT HEART CATHETERIZATION WITH CORONARY/GRAFT ANGIOGRAM N/A 07/28/2012   Procedure: LEFT HEART CATHETERIZATION WITH Beatrix Fetters;  Surgeon: Burnell Blanks, MD;  Location: [facility] CATH LAB;  Service: Cardiovascular;  Laterality: N/A;  . PERCUTANEOUS CORONARY STENT INTERVENTION (PCI-S)  07/28/2012   Procedure: PERCUTANEOUS CORONARY STENT INTERVENTION (PCI-S);  Surgeon: Burnell Blanks, MD;  Location: [facility] CATH LAB;  Service: Cardiovascular;;  . TONSILLECTOMY AND ADENOIDECTOMY  ~ 1951    Inpatient Medications: Scheduled Meds: . amLODipine  5 mg Oral BID  . amoxicillin-clavulanate  1 tablet Oral BID  . aspirin EC  81 mg Oral Daily  . insulin glargine  50 Units Subcutaneous Daily  . metoprolol tartrate  100 mg Oral BID  . prasugrel  10 mg Oral Daily   Continuous Infusions:  PRN Meds: cloNIDine  Allergies:    Allergies  Allergen Reactions  . Betadine [Povidone Iodine] Shortness Of Breath and  Swelling  . Codeine Anaphylaxis and Shortness Of Breath    "quit breathing" (07/28/2012) Tolerates 1-2 shots of morphine  . Demerol Anaphylaxis    "quit breathing" (07/28/2012)  . Iohexol Anaphylaxis    Finger/ankle swelling  . Latex Anaphylaxis    "quit breathing" (07/28/2012)  . Other Anaphylaxis    Perfume, Any Fragrance. Cleaning Fluids.  "quit breathing" (07/28/2012)  . Percocet [Oxycodone-Acetaminophen] Anaphylaxis    "quit breathing; disoriented" (07/28/2012)  . Plavix [Clopidogrel Bisulfate] Anaphylaxis    "get hot; like I'm burning up inside; had to put me in shower after OHS because of that" (07/28/2012)  . Red Dye Anaphylaxis    "quit breathing" (07/28/2012)  . Shellfish Allergy Shortness Of Breath    "broke out in knots all over" (07/28/2012)  . Sulfonamide Derivatives Shortness Of Breath and Rash    "quit breathing" (07/28/2012)  . Tylenol [Acetaminophen] Shortness Of Breath and Itching  . Azilsartan     Other reaction(s): muscle pain  . Azithromycin  Other reaction(s): she cannot take it. felt like she would die. legs would not work , painful and had to get in the shower and had diarrhea.  . Barbiturates     Other reaction(s): SOB  . Cardizem [Diltiazem]     Other reaction(s): took one dose.  felt very sick, bad. said raised bp more.  . Ciprofloxacin     Other reaction(s): after one does had charlie horses all over her legs and could not go back to sleep  . Coumadin [Warfarin]     Other reaction(s): CAN'T TAKE, GETS HOT/BREAKS OUT  . Crestor [Rosuvastatin]     Other reaction(s): RASH  . Ezetimibe     Other reaction(s): RASH  . Fluogen [Influenza Virus Vaccine]     Other reaction(s): gets very sick and thinks it is the mercury level.  . Furosemide     Other reaction(s): makes her too tired.  . Hydrochlorothiazide     Other reaction(s): broke out with blisters on her mouth  . Omega-3-Acid Ethyl Esters     Other reaction(s): bad diarrhea  . Pravastatin  Sodium Other (See Comments)    Leg pain, problems ambulating   . Spironolactone     Other reaction(s): states she doesn't want to take it as it made her dehydrated in the past.  . Statins     Muscle pain  . Sulfa Antibiotics     Other reaction(s): swelling/SOB  . Vancomycin     Other reaction(s): pt states it caused her major abd pain  . Imdur [Isosorbide Nitrate] Other (See Comments)    Intolerance. "Can't remember the reaction."    Social History:   Social History   Socioeconomic History  . Marital status: Widowed    Spouse name: Not on file  . Number of children: 2  . Years of education: Not on file  . Highest education level: Not on file  Occupational History  . Occupation: retired  Tobacco Use  . Smoking status: Never Smoker  . Smokeless tobacco: Never Used  Vaping Use  . Vaping Use: Never used  Substance and Sexual Activity  . Alcohol use: No    Alcohol/week: 0.0 standard drinks  . Drug use: No  . Sexual activity: Never  Other Topics Concern  . Not on file  Social History Narrative  . Not on file   Social Determinants of Health   Financial Resource Strain: Not on file  Food Insecurity: Not on file  Transportation Needs: Not on file  Physical Activity: Not on file  Stress: Not on file  Social Connections: Not on file  Intimate Partner Violence: Not on file    Family History:   Family History  Problem Relation Age of Onset  . Coronary artery disease Father   . Colon cancer Father   . Colon polyps Father   . Heart disease Father   . Stroke Mother   . Hypertension Other   . Breast cancer Other        grandmother  . Diabetes Maternal Grandmother   . Diabetes Paternal Grandmother   . Esophageal cancer Neg Hx   . Rectal cancer Neg Hx   . Stomach cancer Neg Hx      ROS:  Please see the history of present illness.  All other ROS reviewed and negative.     Physical Exam/Data:   Vitals:   12/24/20 0930 12/24/20 0945 12/24/20 0955 12/24/20 1000   BP: (!) 155/97 (!) 155/129 (!) 173/71 (!) 156/69  Pulse:  84 76 72 72  Resp: (!) 22 18 13 19   Temp:      TempSrc:      SpO2: 99% 98% 98% 98%  Weight:      Height:        Intake/Output Summary (Last 24 hours) at 12/24/2020 1103 Last data filed at 12/24/2020 0629 Gross per 24 hour  Intake --  Output 800 ml  Net -800 ml   Last 3 Weights 12/24/2020 12/16/2020 07/24/2020  Weight (lbs) 184 lb 189 lb 188 lb 3.2 oz  Weight (kg) 83.462 kg 85.73 kg 85.367 kg     Body mass index is 34.77 kg/m.  General:  Well nourished, well developed, in no acute distress HEENT: normal Lymph: no adenopathy Neck: no JVD Endocrine:  No thryomegaly Vascular: No carotid bruits; FA pulses 2+ bilaterally without bruits  Cardiac:  normal S1, S2; RRR; no murmur Lungs:  clear to auscultation bilaterally, no wheezing, rhonchi or rales  Abd: soft, nontender, no hepatomegaly  Ext: no edema Musculoskeletal:  No deformities, BUE and BLE strength normal and equal Skin: warm and dry  Neuro:  CNs 2-12 intact, no focal abnormalities noted Psych:  Normal affect   EKG:  The EKG was personally reviewed and demonstrates:  SR, HR 64 bpm, TWI in lateral leads (chronic) Telemetry:  Telemetry was personally reviewed and demonstrates:  SR  Relevant CV Studies:  CORONARY STENT INTERVENTION 06/2019    Conclusion    SVG.  Prox Graft to Mid Graft lesion is 90% stenosed.  A drug-eluting stent was successfully placed using a STENT SYNERGY DES 3.5X32.  Post intervention, there is a 0% residual stenosis.  SVG.  Dist Graft lesion is 95% stenosed.  Balloon angioplasty was performed using a BALLOON SAPPHIRE [place] 3.5X15.  Post intervention, there is a 0% residual stenosis.   1. Successful PCI of the SVG to OM with DES x 1 2. Successful PCI of the SVG to RCA with POBA.   Plan: DAPT for at least one year and I would consider indefinitely. Anticipate DC in am if stable.   Diagnostic Dominance:  Right    Intervention    Echo 06/2019 1. Left ventricular ejection fraction, by visual estimation, is 60 to  65%. The left ventricle has normal function. Normal left ventricular size.  Left ventricular septal wall thickness was mildly increased. Mildly  increased left ventricular posterior  wall thickness. There is mildly increased left ventricular hypertrophy.  2. Left ventricular diastolic Doppler parameters are consistent with  impaired relaxation pattern of LV diastolic filling.  3. Global right ventricle has normal systolic function.The right  ventricular size is normal. No increase in right ventricular wall  thickness.  4. Left atrial size was normal.  5. Right atrial size was normal.  6. Mild calcification of the anterior mitral valve leaflet(s).  7. Mild thickening of the anterior mitral valve leaflet(s).  8. The mitral valve is normal in structure. Trace mitral valve  regurgitation. No evidence of mitral stenosis.  9. The tricuspid valve is normal in structure. Tricuspid valve  regurgitation is trivial.  10. The aortic valve is normal in structure. Aortic valve regurgitation  was not visualized by color flow Doppler. Mild aortic valve sclerosis  without stenosis.  11. Mildly calcified and thickened aortic valve leaflets.  12. The pulmonic valve was normal in structure. Pulmonic valve  regurgitation is not visualized by color flow Doppler.  13. Mildly elevated pulmonary artery systolic pressure.  14. The inferior vena cava is normal  in size with greater than 50%  respiratory variability, suggesting right atrial pressure of 3 mmHg.    Laboratory Data:  High Sensitivity Troponin:   Recent Labs  Lab 12/24/20 0459  TROPONINIHS 12     Chemistry Recent Labs  Lab 12/24/20 0459  NA 138  K 3.8  CL 102  CO2 24  GLUCOSE 197*  BUN 11  CREATININE 0.66  CALCIUM 9.3  GFRNONAA >60  ANIONGAP 12    Recent Labs  Lab 12/24/20 0459  PROT 6.8  ALBUMIN 3.7   AST 18  ALT 13  ALKPHOS 60  BILITOT 0.8   Hematology Recent Labs  Lab 12/24/20 0312  WBC 6.2  RBC 4.31  HGB 13.0  HCT 39.9  MCV 92.6  MCH 30.2  MCHC 32.6  RDW 12.8  PLT 201   Radiology/Studies:  CT Head Wo Contrast  Result Date: 12/24/2020 CLINICAL DATA:  TIA.  Headache and dizziness.  Presyncope EXAM: CT HEAD WITHOUT CONTRAST TECHNIQUE: Contiguous axial images were obtained from the base of the skull through the vertex without intravenous contrast. COMPARISON:  07/26/2010 FINDINGS: Brain: No evidence of acute infarction, hemorrhage, hydrocephalus, extra-axial collection or mass lesion/mass effect. Low-density in the cerebral white matter attributed to chronic small vessel ischemia, overall moderate. Mild for age cerebral volume loss. Senescent changes have progressed from prior. Vascular: Atheromatous calcifications Skull: Normal. Negative for fracture or focal lesion. Sinuses/Orbits: Negative IMPRESSION: 1. No acute or reversible finding. 2. Aging brain with progression from 2011. Electronically Signed   By: Monte Fantasia M.D.   On: 12/24/2020 05:05   MR BRAIN WO CONTRAST  Result Date: 12/24/2020 CLINICAL DATA:  Awoke with dizziness.  Hypertension. EXAM: MRI HEAD WITHOUT CONTRAST TECHNIQUE: Multiplanar, multiecho pulse sequences of the brain and surrounding structures were obtained without intravenous contrast. COMPARISON:  Head CT from earlier today FINDINGS: Brain: No acute infarction, hemorrhage, hydrocephalus, extra-axial collection or mass lesion. Moderate chronic small vessel ischemia. Cerebral volume loss in keeping with aging. Vascular: Normal flow voids Skull and upper cervical spine: Normal marrow signal. 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 Sinuses/Orbits: Negative IMPRESSION: 1. No acute or reversible finding. 2. Moderate chronic small vessel ischemia. Electronically Signed   By: Monte Fantasia M.D.   On: 12/24/2020 07:16      Assessment and Plan:   1. Uncontrolled HTN - BP elevated in 180s/80s at night around 10 am for past 3 weeks. Morning BP of 140s/60s. - She takes amlodipine 5am AM and 2.5 mg PM (prescribed 44m BID) - Underlying stress/Anxiety  - No proper sleep -Continue home metoprolol 1090mBID and Valsartan 16046mID - Increase amlodipine to 5mg60m noon - Take BID dose of medications at 8AM and 8PM.  - Avoid PRN clonidine  - Add hydralazine 25mg2m q 8 hours for SBP > 160.   - Keep log of blood pressure and bring to clinic during office visit for review.   2. Dizziness/Headache/Double vision - Some orthostatic component (check vital) - CT and MRI of head without acute finding - Avoid clonidine - Per primary team   3. CAD s/p CABG and multiple PCIs - No angina - Continue ASA and Effient   4. HLD - No results found for requested labs within last 8760 hours.  - Hx of statin intolerance - On Praluent   Risk Assessment/Risk Scores:  {  For questions or updates, please contact [facility] consult [email or web address] for contact info under  Jarrett Soho, PA  12/24/2020 11:03 AM   History and all data above reviewed.  Patient examined.  I agree with the findings as above.   The patient reports severe dizziness.  She has labile BPs and takes her meds on a very complex schedule.  She took clonidine PRN for increased BP yesterday and developed symptoms as above.  She unfortunately has many medication intolerances and developed symptoms as above.    Currently she reports that she is too dizzy to work.  Work up as above negative.  The patient exam reveals COR:RRR  ,  Lungs: Clear  ,  Abd: Positive bowel sounds, no rebound no guarding, Ext No edema  .  All available labs, radiology testing, previous records reviewed. Agree with documented assessment and plan.   HTN:  Very problematic in a patient with multiple med intolerances.  Also significant stress at home and some anxiety  about her medical condition.  We reviewed at length her med regimen and we are suggesting the changes as above.  I negotiated this with her but I suspect that these will not be the final dose or intervals.   CAD:  Continue meds as listed.  She needs clearance to hold meds for a GI procedure but this can be deferred until she sees Dr. Claiborne Billings early next month.     Jeneen Rinks Derek Huneycutt  12:54 PM  12/24/2020

## 2020-12-24 NOTE — ED Notes (Signed)
Lab called requesting a redraw of the light green tube for CMP and Troponin.

## 2020-12-24 NOTE — Telephone Encounter (Signed)
Spoke to patient-she is in the hospital and being admitted.  Advised if cardiology is consulted, someone from our team will see her.   Advised Dr. Claiborne Billings is in the cath lab today so he will be unable to see her today.     Advised would make MD aware.

## 2020-12-24 NOTE — ED Provider Notes (Signed)
Emergency Department Provider Note   I have reviewed the triage vital signs and the nursing notes.   HISTORY  Chief Complaint Hypertension, Headache, and Blurred Vision   HPI Ruth [name] is a 75 y.o. female with PMH of HTN, CAD, HLD, and DM presents to the ED for evaluation of acute onset dizziness and blurry vision. Patient went to bed around 7 PM without symptoms. She awoke when her dog woke her from sleep to go to the bathroom. She felt very dizzy like the room was spinning and called EMS. She took a Clonidine for very high BP and when EMS arrives her SBP was > 200. She has had chronic HTN for years but worsening BP especially over the last 3 weeks. She notes that when her BP gets high she will have temporary bilateral blurry vision, dizziness but tonight was more severe. She has tried to f/u with her Cardiology team as an outpatient without success. No blurry vision now and dizziness significantly improved after getting lower BP.   Past Medical History:  Diagnosis Date  . Anxiety   . CAD (coronary artery disease)    a. CABG x 4 in 2006 in [place] (LIMA->LAD, VG->Diag, VG->OM, VG->RCA); b. DES to midbody of SVG-PDA/PLA 07/2012; c. 03/2015 Myoview: EF 65%, small defect of mild severit in basal inferolateral wall, low risk.  . Complication of anesthesia    "quit breathing when I got ?[place]" (07/28/2012)  . Depression   . Diverticulitis   . Diverticulosis   . Dyslipidemia    Intolerant of statins  . Fecal incontinence   . GERD (gastroesophageal reflux disease)   . Hyperlipidemia   . Hyperplastic colon polyp   . IBS (irritable bowel syndrome)   . Labile hypertension    a. 09/2016 Renal Artery duplex: nl renal arteries.  . Migraines    "had them in my 30's" (07/28/2012)  . Multiple allergies   . Obesity   . Steatohepatitis   . Type II diabetes mellitus (HCC)    a. no meds, diet controlled - takes cinnamon.    Patient Active Problem List   Diagnosis Date Noted   . Non-ST elevation (NSTEMI) myocardial infarction ([place])   . Chest pain 06/21/2019  . IBS (irritable bowel syndrome) 06/21/2019  . Hyperbilirubinemia 06/21/2019  . GERD (gastroesophageal reflux disease) 06/21/2019  . Abnormal nuclear stress test   . Hypertensive heart disease 05/25/2015  . Type II or unspecified type diabetes mellitus without mention of complication, uncontrolled 02/14/2014  . Hyponatremia 02/13/2014  . HTN (hypertension) 02/13/2014  . Sepsis secondary to UTI ([place]) 02/12/2014  . Acute pyelonephritis 02/12/2014  . Unstable angina ([place]) 07/29/2012  . Obesity (BMI 30-39.9) 07/29/2012  . Bloating 06/15/2012  . Generalized abdominal pain 06/15/2012  . Family history of colon cancer 06/15/2012  . 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 Coronary artery disease 12/30/2010  . Hyperlipidemia with target LDL less than 70 12/30/2010  . Diabetes mellitus ([place]) 12/30/2010  . RECTAL INCONTINENCE 06/13/2008    Past Surgical History:  Procedure Laterality Date  . ABDOMINAL HYSTERECTOMY  1970's  . BREAST LUMPECTOMY     bilateral  . CARDIAC CATHETERIZATION  2006  . CORONARY ANGIOPLASTY WITH STENT PLACEMENT  07/28/2012   "1; first time for me" (07/28/2012)  . CORONARY ARTERY BYPASS GRAFT  2006   CABG X4  . CORONARY STENT INTERVENTION N/A 03/01/2017   Procedure: Coronary Stent Intervention;  Surgeon: Troy Sine,  MD;  Location: [facility];  Service: Cardiovascular;  Laterality: N/A;  . CORONARY STENT INTERVENTION N/A 06/23/2019   Procedure: CORONARY STENT INTERVENTION;  Surgeon: Martinique, Peter M, MD;  Location: [facility];  Service: Cardiovascular;  Laterality: N/A;  . EXCISIONAL HEMORRHOIDECTOMY  1970's  . INGUINAL HERNIA REPAIR  1970's?   left  . LEFT HEART CATH AND CORS/GRAFTS ANGIOGRAPHY N/A 03/01/2017   Procedure: Left Heart Cath and Cors/Grafts Angiography;  Surgeon: Troy Sine, MD;  Location: [facility]  [facility];  Service: Cardiovascular;  Laterality: N/A;  . LEFT HEART CATH AND CORS/GRAFTS ANGIOGRAPHY N/A 06/22/2019   Procedure: LEFT HEART CATH AND CORS/GRAFTS ANGIOGRAPHY;  Surgeon: Belva Crome, MD;  Location: [facility];  Service: Cardiovascular;  Laterality: N/A;  . LEFT HEART CATHETERIZATION WITH CORONARY/GRAFT ANGIOGRAM N/A 07/28/2012   Procedure: LEFT HEART CATHETERIZATION WITH Beatrix Fetters;  Surgeon: Burnell Blanks, MD;  Location: [facility] CATH LAB;  Service: Cardiovascular;  Laterality: N/A;  . PERCUTANEOUS CORONARY STENT INTERVENTION (PCI-S)  07/28/2012   Procedure: PERCUTANEOUS CORONARY STENT INTERVENTION (PCI-S);  Surgeon: Burnell Blanks, MD;  Location: [facility] CATH LAB;  Service: Cardiovascular;;  . TONSILLECTOMY AND ADENOIDECTOMY  ~ 1951    Allergies Betadine [povidone iodine], Codeine, Demerol, Iohexol, Latex, Other, Percocet [oxycodone-acetaminophen], Plavix [clopidogrel bisulfate], Red dye, Shellfish allergy, Sulfonamide derivatives, Tylenol [acetaminophen], Azilsartan, Azithromycin, Barbiturates, Cardizem [diltiazem], Ciprofloxacin, Coumadin [warfarin], Crestor [rosuvastatin], Ezetimibe, Fluogen [influenza virus vaccine], Furosemide, Hydrochlorothiazide, Omega-3-acid ethyl esters, Pravastatin sodium, Spironolactone, Statins, Sulfa antibiotics, Vancomycin, and Imdur [isosorbide nitrate]  Family History  Problem Relation Age of Onset  . Coronary artery disease Father   . Colon cancer Father   . Colon polyps Father   . Heart disease Father   . Stroke Mother   . Hypertension Other   . Breast cancer Other        grandmother  . Diabetes Maternal Grandmother   . Diabetes Paternal Grandmother   . Esophageal cancer Neg Hx   . Rectal cancer Neg Hx   . Stomach cancer Neg Hx     Social History Social History   Tobacco Use  . Smoking status: Never Smoker  . Smokeless tobacco: Never Used  Vaping Use  . Vaping Use: Never used  Substance Use Topics   . Alcohol use: No    Alcohol/week: 0.0 standard drinks  . Drug use: No    Review of Systems  Constitutional: No fever/chills Eyes: No visual changes. ENT: No sore throat. Cardiovascular: Denies chest pain. Positive elevated BP worse at night.  Respiratory: Denies shortness of breath. Gastrointestinal: Diverticulitis abdominal pain improving now on day 5 of abx.  No nausea, no vomiting.  No diarrhea.  No constipation. Genitourinary: Negative for dysuria. Musculoskeletal: Negative for back pain. Skin: Negative for rash. Neurological: Negative for focal weakness or numbness. Occasional HA with dizziness.   10-point ROS otherwise negative.  ____________________________________________   PHYSICAL EXAM:  VITAL SIGNS: ED Triage Vitals  Enc Vitals Group     BP 12/24/20 0250 (!) 176/68     Pulse Rate 12/24/20 0250 73     Resp 12/24/20 0250 12     Temp 12/24/20 0250 98.3 F (36.8 C)     Temp Source 12/24/20 0250 Oral     SpO2 12/24/20 0250 98 %     Weight 12/24/20 0251 184 lb (83.5 kg)     Height 12/24/20 0251 5' 1"  (1.549 m)   Constitutional: Alert and oriented. Well appearing and in no  acute distress. Eyes: Conjunctivae are normal. PERRL. EOMI. Head: Atraumatic. Nose: No congestion/rhinnorhea. Mouth/Throat: Mucous membranes are moist.   Neck: No stridor.  Cardiovascular: Normal rate, regular rhythm. Good peripheral circulation. Grossly normal heart sounds.   Respiratory: Normal respiratory effort.  No retractions. Lungs CTAB. Gastrointestinal: Soft and nontender. No distention.  Musculoskeletal: No lower extremity tenderness nor edema. No gross deformities of extremities. Neurologic:  Normal speech and language. No gross focal neurologic deficits are appreciated. No facial asymmetry. 5/5 strength in the bilateral upper and lower extremities.  Skin:  Skin is warm, dry and intact. No rash noted.  ____________________________________________   LABS (all labs ordered are  listed, but only abnormal results are displayed)  Labs Reviewed  URINALYSIS, ROUTINE W REFLEX MICROSCOPIC - Abnormal; Notable for the following components:      Result Value   Color, Urine STRAW (*)    Glucose, UA 150 (*)    All other components within normal limits  COMPREHENSIVE METABOLIC PANEL - Abnormal; Notable for the following components:   Glucose, Bld 197 (*)    All other components within normal limits  RESP PANEL BY RT-PCR (FLU A&B, COVID) ARPGX2  CBC WITH DIFFERENTIAL/PLATELET  PROTIME-INR  TROPONIN I (HIGH SENSITIVITY)   ____________________________________________  EKG   EKG Interpretation  Date/Time:  Tuesday December 24 2020 04:06:43 EDT Ventricular Rate:  64 PR Interval:  141 QRS Duration: 88 QT Interval:  436 QTC Calculation: 450 R Axis:   11 Text Interpretation: Sinus rhythm Abnormal T, consider ischemia, lateral leads Similar to Oct 2020 tracing Confirmed by Nanda Quinton [phone]) on 12/24/2020 4:08:30 AM       ____________________________________________  RADIOLOGY  CT Head Wo Contrast  Result Date: 12/24/2020 CLINICAL DATA:  TIA.  Headache and dizziness.  Presyncope EXAM: CT HEAD WITHOUT CONTRAST TECHNIQUE: Contiguous axial images were obtained from the base of the skull through the vertex without intravenous contrast. COMPARISON:  07/26/2010 FINDINGS: Brain: No evidence of acute infarction, hemorrhage, hydrocephalus, extra-axial collection or mass lesion/mass effect. Low-density in the cerebral white matter attributed to chronic small vessel ischemia, overall moderate. Mild for age cerebral volume loss. Senescent changes have progressed from prior. Vascular: Atheromatous calcifications Skull: Normal. Negative for fracture or focal lesion. Sinuses/Orbits: Negative IMPRESSION: 1. No acute or reversible finding. 2. Aging brain with progression from 2011. Electronically Signed   By: Monte Fantasia M.D.   On: 12/24/2020 05:05     ____________________________________________   PROCEDURES  Procedure(s) performed:   Procedures  None  ____________________________________________   INITIAL IMPRESSION / ASSESSMENT AND PLAN / ED COURSE  Pertinent labs & imaging results that were available during my care of the patient were reviewed by me and considered in my medical decision making (see chart for details).   Patient presents to the emergency department with acute onset dizziness, blurry vision thought to be related to spiking blood pressures especially at night.  Patient has difficultly improved symptoms on arrival after taking her clonidine.  Blood pressure on arrival is in the 923R systolic but was greater than 007 systolic with EMS on scene.  She has no focal neurologic deficits to activate a code stroke.  No chest pain.  Plan for screening blood work to assess for endorgan damage related to hypertension and obtain a CT scan of the head.   COVID and Flu are negative. Normal kidney function. No anemia. CT head reviewed with no acute findings. Symptoms seem most associated with elevated BP but dose have some lingering dizziness. Plan for  MRI brain to r/o posterior circulation CVA.   Troponin WNL. Chemistry reviewed with no acute findings.   Discussed patient's case with TR[facility], Dr. Alcario Drought to request admission. Patient and family (if present) updated with plan. Care transferred to [facility] service.  I reviewed all nursing notes, vitals, pertinent old records, EKGs, labs, imaging (as available).  ____________________________________________  FINAL CLINICAL IMPRESSION(S) / ED DIAGNOSES  Final diagnoses:  Hypertensive urgency    Note:  This document was prepared using Dragon voice recognition software and may include unintentional dictation errors.  Nanda Quinton, MD, [facility] Emergency Medicine    Sampson Self, Wonda [facility], MD 12/24/20 [phone]

## 2020-12-25 DIAGNOSIS — R42 Dizziness and giddiness: Secondary | ICD-10-CM | POA: Diagnosis not present

## 2020-12-25 DIAGNOSIS — I10 Essential (primary) hypertension: Secondary | ICD-10-CM | POA: Diagnosis not present

## 2020-12-25 LAB — CBC
HCT: 44.2 % (ref 36.0–46.0)
Hemoglobin: 14.6 g/dL (ref 12.0–15.0)
MCH: 30.2 pg (ref 26.0–34.0)
MCHC: 33 g/dL (ref 30.0–36.0)
MCV: 91.5 fL (ref 80.0–100.0)
Platelets: 265 10*3/uL (ref 150–400)
RBC: 4.83 MIL/uL (ref 3.87–5.11)
RDW: 12.9 % (ref 11.5–15.5)
WBC: 8.2 10*3/uL (ref 4.0–10.5)
nRBC: 0 % (ref 0.0–0.2)

## 2020-12-25 LAB — HEMOGLOBIN A1C
Hgb A1c MFr Bld: 7.9 % — ABNORMAL HIGH (ref 4.8–5.6)
Mean Plasma Glucose: 180.03 mg/dL

## 2020-12-25 LAB — BASIC METABOLIC PANEL
Anion gap: 10 (ref 5–15)
BUN: 14 mg/dL (ref 8–23)
CO2: 25 mmol/L (ref 22–32)
Calcium: 9.3 mg/dL (ref 8.9–10.3)
Chloride: 103 mmol/L (ref 98–111)
Creatinine, Ser: 0.83 mg/dL (ref 0.44–1.00)
GFR, Estimated: >60 mL/min (ref >60)
Glucose, Bld: 178 mg/dL — ABNORMAL HIGH (ref 70–99)
Potassium: 4.6 mmol/L (ref 3.5–5.1)
Sodium: 138 mmol/L (ref 135–145)

## 2020-12-25 LAB — GLUCOSE, CAPILLARY
Glucose-Capillary: 144 mg/dL — ABNORMAL HIGH (ref 70–99)
Glucose-Capillary: 137 mg/dL — ABNORMAL HIGH (ref 70–99)
Glucose-Capillary: 140 mg/dL — ABNORMAL HIGH (ref 70–99)
Glucose-Capillary: 174 mg/dL — ABNORMAL HIGH (ref 70–99)

## 2020-12-25 MED ORDER — METOPROLOL TARTRATE 25 MG PO TABS
100.0000 mg | ORAL_TABLET | Freq: Two times a day (BID) | ORAL | Status: DC
  Administered 2020-12-25 – 2020-12-26 (×3): 100 mg via ORAL
  Filled 2020-12-25 (×3): qty 4

## 2020-12-25 NOTE — Progress Notes (Signed)
Orthostatic BPs  Supine 162/73, 67 bpm  Sitting 189/98, 78 bpm  Standing 164/95, 80 bpm  Standing after 3 min 175/81, 85 bpm  Pt dizzy with each position change.  Pt not dizzy with head movements.  Vestibular assessment unremarkable. Post BP once in chair was 163/68 with HR 79 bpm.  O2 99% on RA.  Will follow pt and progress pt as able. Full evaluation note to follow.   Shirelle Tootle M,PT Acute Rehab Services [phone] (pager)

## 2020-12-25 NOTE — Evaluation (Signed)
Physical Therapy Evaluation Patient Details Name: Ruth [name] MRN: [id number] DOB: [birth year] Today's Date: 12/25/2020   History of Present Illness  75 y.o. female who is being seen for the evaluation of high blood pressure.  PMH:  CAD s/p CABG and subsequent PCI, mitral regurgitation, hypertension, depression, hyperlipidemia, diabetes mellitus, IBS and morbid obesity  Clinical Impression  Pt admitted with above diagnosis. Pt was able to ambulate on unit with overall fair stability. Pt reports some dizziness but it did not impair function.  Pt did not have orthostatic BP's however BP's fluctuating with position changes and are elevated.  Vestibular assessment unremarkable. Pt did not have issues with head turns as well. Overall pt tolerated session well.  Pt currently with functional limitations due to the deficits listed below (see PT Problem List). Pt will benefit from skilled PT to increase their independence and safety with mobility to allow discharge to the venue listed below.    Orthostatic BPs  Supine 162/73, 67 bpm  Sitting 189/98, 78 bpm  Standing 164/95, 80 bpm  Standing after 3 min 175/81, 85 bpm  Pt dizzy with each position change.  Pt not dizzy with head movements.  Vestibular assessment unremarkable. Post BP once in chair was 163/68 with HR 79 bpm.  O2 99% on RA.    Follow Up Recommendations Home health PT    Equipment Recommendations  None recommended by PT    Recommendations for Other Services       Precautions / Restrictions Precautions Precautions: Fall Restrictions Weight Bearing Restrictions: No      Mobility  Bed Mobility Overal bed mobility: Independent                  Transfers Overall transfer level: Independent                  Ambulation/Gait Ambulation/Gait assistance: Min guard Gait Distance (Feet): 280 Feet Assistive device: Rolling walker (2 wheeled) Gait Pattern/deviations: Decreased stride length;Step-through pattern    Gait velocity interpretation: <1.8 ft/sec, indicate of risk for recurrent falls General Gait Details: Pt steady with RW and did not need physical assist. could not elicit dizziness with head movements.  Pt only reported dizziness when she transitioned from supine to sit, sit to stand etc.  Stairs            Wheelchair Mobility    Modified Rankin (Stroke Patients Only)       Balance Overall balance assessment: Needs assistance Sitting-balance support: No upper extremity supported;Feet supported Sitting balance-Leahy Scale: Fair     Standing balance support: Bilateral upper extremity supported;During functional activity Standing balance-Leahy Scale: Poor Standing balance comment: relies on UE support for dynamic activity but can stand statically without UE support                             Pertinent Vitals/Pain Pain Assessment: No/denies pain    Home Living Family/patient expects to be discharged to:: Private residence Living Arrangements: Children Available Help at Discharge: Family;Available 24 hours/day (son present and they take care of each other) Type of Home: House Home Access: Stairs to enter Entrance Stairs-Rails: Right;Left;Can reach both Entrance Stairs-Number of Steps: 3 Home Layout: One level Home Equipment: Walker - 4 wheels      Prior Function Level of Independence: Independent with assistive device(s);Needs assistance   Gait / Transfers Assistance Needed: used rollator at night to go to bathroom  ADL's / Homemaking Assistance Needed:  B/D self  Comments: Mows and drives     Hand Dominance   Dominant Hand: Right    Extremity/Trunk Assessment   Upper Extremity Assessment Upper Extremity Assessment: Defer to OT evaluation    Lower Extremity Assessment Lower Extremity Assessment: Generalized weakness    Cervical / Trunk Assessment Cervical / Trunk Assessment: Normal  Communication   Communication: No difficulties   Cognition Arousal/Alertness: Awake/alert Behavior During Therapy: Anxious Overall Cognitive Status: Within Functional Limits for tasks assessed                                        General Comments General comments (skin integrity, edema, etc.): Supine 162/73, 67 bpm, sitting 189/98, 78 bpm;  Standing 164/95, 80 bpm,  Standing after 3 min 175/81, 85 bpm    Exercises General Exercises - Lower Extremity Ankle Circles/Pumps: AROM;Both;10 reps;Supine Long Arc Quad: AROM;Both;10 reps;Seated   Assessment/Plan    PT Assessment Patient needs continued PT services  PT Problem List Decreased mobility;Decreased activity tolerance;Decreased knowledge of use of DME;Cardiopulmonary status limiting activity       PT Treatment Interventions Gait training;DME instruction;Therapeutic activities;Therapeutic exercise;Stair training;Functional mobility training;Balance training;Patient/family education    PT Goals (Current goals can be found in the Care Plan section)  Acute Rehab PT Goals Patient Stated Goal: to get better PT Goal Formulation: With patient Time For Goal Achievement: 01/08/21 Potential to Achieve Goals: Good    Frequency Min 3X/week   Barriers to discharge Decreased caregiver support      Co-evaluation               AM-PAC PT "6 Clicks" Mobility  Outcome Measure Help needed turning from your back to your side while in a flat bed without using bedrails?: None Help needed moving from lying on your back to sitting on the side of a flat bed without using bedrails?: None Help needed moving to and from a bed to a chair (including a wheelchair)?: A Little Help needed standing up from a chair using your arms (e.g., wheelchair or bedside chair)?: None Help needed to walk in hospital room?: A Little Help needed climbing 3-5 steps with a railing? : A Little 6 Click Score: 21    End of Session Equipment Utilized During Treatment: Gait belt Activity  Tolerance: Patient limited by fatigue Patient left: in chair;with call bell/phone within reach;with chair alarm set Nurse Communication: Mobility status PT Visit Diagnosis: Muscle weakness (generalized) (M62.81);Dizziness and giddiness (R42)    Time: 4270-6237 PT Time Calculation (min) (ACUTE ONLY): 44 min   Charges:   PT Evaluation $PT Eval Moderate Complexity: 1 Mod PT Treatments $Gait Training: 8-22 mins        Ayaz Sondgeroth M,PT Acute Rehab Services [phone] (pager)  Alvira Philips 12/25/2020, 12:30 PM

## 2020-12-25 NOTE — Care Management Obs Status (Signed)
[facility] NOTIFICATION   Patient Details  Name: RONESHA HEENAN MRN: [id number] Date of Birth: 1946-06-07   Medicare Observation Status Notification Given:       Deirdre Evener 12/25/2020, 3:28 PM

## 2020-12-25 NOTE — Progress Notes (Signed)
PROGRESS NOTE    Ruth [name]  EQA:[id number] DOB: Aug 14, 1946 DOA: 12/24/2020 PCP: Crist Infante, MD   Brief Narrative:  HPI: Ruth [name] is a 75 y.o. female with medical history significant of DM; obesity; labile HTN; HLD; CAD s/p CABG; and depression/anxiety presenting with HTN.  She has been having BP issues for 3 weeks - fine in AMs, spikes up at night (as high as 233/100).  She has been trying to get into cardiology without success - has appt on 5/11.  She is dizzy with leaning/standing.  She has fecal incontinence "because my butthole is dead, they already told me that."  She also has DM issues.  For her BP, she takes Norvasc 5 mg BID; Clonidine 0.1 mg BID prn (took single dose last night for the first time); Lopressor 100 mg BID; and valsartan 160 mg BID.  She moved here to help care for her parents, who have since died.  Her son has systolic CHF.  She had a CT with urology and was given Augmentin for an infection; UA today is clear.  Concern for diverticulitis vs. Colon cancer but needed clearance from cards first due to issues with BP - and concern to stop blood thinners with stents.  ED Course:  Carryover, per Dr. Alcario Drought:  75 yo F with high BP, dizziness when dog woke her up from sleep this AM. HTN urgency vs stroke. EDP ordering MRI on patient now.  Assessment & Plan:   Principal Problem:   Dizziness Active Problems:   Coronary artery disease   Hyperlipidemia with target LDL less than 70   Diabetes mellitus (HCC)   Generalized abdominal pain   Obesity (BMI 30-39.9)   HTN (hypertension)   Multiple drug allergies  Dizziness: CT head and MRI negative.  She still complains of feeling dizzy however she has been thoroughly seen by physical therapy and she was ruled out of vestibular component and despite of her complaining of dizziness, she was able to ambulate very well with the PT.  I suspect most of her symptoms are psychosomatic.  Multiple drug  allergies/MCS -She has 30 listed medication allergies.  She tells me that she is allergic to everything that is made in this world artificially.  CAD s/p CABG: Continue ASA, Prasugrel.  Cardiology on board.  Labile HTN: Part of the reason for her labile hypertension is that she was taking as needed clonidine and taking her antihypertensives at her own times.  Now her medications have been consolidated by cardiology.  I personally advised her not to take clonidine as needed as that may cause significant rebound hypertension which could be contributing to her dizziness as well. Blood pressure still labile, was hypertensive last night and within normal range now.  Currently she is on following medications. Amlodipine 5 mg twice daily Avapro 150 mg twice daily Lopressor 100 mg twice daily Hydralazine 25 mg every 8 hours as needed.  HLD -Continue Praluent  DM type II: Last hemoglobin A1c in October 2020.  That was 8.2.  We will recheck hemoglobin A1c today.  Takes 50 units at home which is what she is here.  Blood sugar controlled.  Continue that as well as SSI.  Generalized abdominal pain: She did not complain of any abdominal pain to me.  Exam unremarkable.  Recurrent UTI -UA this admission appears clean -Complete course of Augmentin and then resume Nitrofurantoin suppressive therapy  Obesity -Body mass index is 34.77 kg/m.Marland Kitchen  Weight loss counseling provided.   DVT  prophylaxis: enoxaparin (LOVENOX) injection 40 mg Start: 12/24/20 1300   Code Status: Full Code  Family Communication:  None present at bedside.  Plan of care discussed with patient in length and he verbalized understanding and agreed with it.  Status is: Observation  The patient will require care spanning > 2 midnights and should be moved to inpatient because: Unsafe d/c plan  Dispo: The patient is from: Home              Anticipated d/c is to: Home              Patient currently is not medically stable to d/c.    Difficult to place patient No        Estimated body mass index is 34.77 kg/m as calculated from the following:   Height as of this encounter: 5' 1"  (1.549 m).   Weight as of this encounter: 83.5 kg.      Nutritional status:               Consultants:   Cardiology  Procedures:   None  Antimicrobials:  Anti-infectives (From admission, onward)   Start     Dose/Rate Route Frequency Ordered Stop   12/24/20 1100  amoxicillin-clavulanate (AUGMENTIN) 500-125 MG per tablet 500 mg       Note to Pharmacy: Bid x 5 days     1 tablet Oral 2 times daily 12/24/20 0926 12/26/20 0959         Subjective: Seen and examined.  Still complains of dizziness when she is moving.  Does not have any nystagmus on examination.  She then tells me that " her legs were shaking last night" because she was given extra antihypertensive medications.  She was reassured that that is not one of the adverse effect of those medications.  Objective: Vitals:   12/24/20 2007 12/24/20 2143 12/25/20 0855 12/25/20 1224  BP: (!) 185/71 (!) 178/78 (!) 145/59 130/69  Pulse: 65   61  Resp: 20   17  Temp: 98.2 F (36.8 C)   98.5 F (36.9 C)  TempSrc: Oral   Oral  SpO2: 100%   98%  Weight:      Height:        Intake/Output Summary (Last 24 hours) at 12/25/2020 1356 Last data filed at 12/24/2020 2345 Gross per 24 hour  Intake --  Output 1100 ml  Net -1100 ml   Filed Weights   12/24/20 0251  Weight: 83.5 kg    Examination:  General exam: Appears calm and comfortable  Respiratory system: Clear to auscultation. Respiratory effort normal. Cardiovascular system: S1 & S2 heard, RRR. No JVD, murmurs, rubs, gallops or clicks. No pedal edema. Gastrointestinal system: Abdomen is nondistended, soft and nontender. No organomegaly or masses felt. Normal bowel sounds heard. Central nervous system: Alert and oriented. No focal neurological deficits. Extremities: Symmetric 5 x 5 power. Skin: No rashes,  lesions or ulcers Psychiatry: Judgement and insight appear normal. Mood & affect appropriate.    Data Reviewed: I have personally reviewed following labs and imaging studies  CBC: Recent Labs  Lab 12/24/20 0312 12/25/20 0347  WBC 6.2 8.2  NEUTROABS 4.5  --   HGB 13.0 14.6  HCT 39.9 44.2  MCV 92.6 91.5  PLT 201 720   Basic Metabolic Panel: Recent Labs  Lab 12/24/20 0459 12/25/20 0347  NA 138 138  K 3.8 4.6  CL 102 103  CO2 24 25  GLUCOSE 197* 178*  BUN 11 14  CREATININE 0.66 0.83  CALCIUM 9.3 9.3   GFR: Estimated Creatinine Clearance: 56.5 mL/min (by C-G formula based on SCr of 0.83 mg/dL). Liver Function Tests: Recent Labs  Lab 12/24/20 0459  AST 18  ALT 13  ALKPHOS 60  BILITOT 0.8  PROT 6.8  ALBUMIN 3.7   No results for input(s): LIPASE, AMYLASE in the last 168 hours. No results for input(s): AMMONIA in the last 168 hours. Coagulation Profile: Recent Labs  Lab 12/24/20 0312  INR 1.0   Cardiac Enzymes: No results for input(s): CKTOTAL, CKMB, CKMBINDEX, TROPONINI in the last 168 hours. BNP (last 3 results) No results for input(s): PROBNP in the last 8760 hours. HbA1C: No results for input(s): HGBA1C in the last 72 hours. CBG: Recent Labs  Lab 12/24/20 1504 12/24/20 1808 12/24/20 2048 12/25/20 0734 12/25/20 1148  GLUCAP 178* 161* 162* 144* 137*   Lipid Profile: No results for input(s): CHOL, HDL, LDLCALC, TRIG, CHOLHDL, LDLDIRECT in the last 72 hours. Thyroid Function Tests: No results for input(s): TSH, T4TOTAL, FREET4, T3FREE, THYROIDAB in the last 72 hours. Anemia Panel: No results for input(s): VITAMINB12, FOLATE, FERRITIN, TIBC, IRON, RETICCTPCT in the last 72 hours. Sepsis Labs: No results for input(s): PROCALCITON, LATICACIDVEN in the last 168 hours.  Recent Results (from the past 240 hour(s))  Resp Panel by RT-PCR (Flu A&B, Covid) Nasopharyngeal Swab     Status: None   Collection Time: 12/24/20  3:13 AM   Specimen: Nasopharyngeal  Swab; Nasopharyngeal(NP) swabs in vial transport medium  Result Value Ref Range Status   SARS Coronavirus 2 by RT PCR NEGATIVE NEGATIVE Final    Comment: (NOTE) SARS-CoV-2 target nucleic acids are NOT DETECTED.  The SARS-CoV-2 RNA is generally detectable in upper respiratory specimens during the acute phase of infection. The lowest concentration of SARS-CoV-2 viral copies this assay can detect is 138 copies/mL. A negative result does not preclude SARS-Cov-2 infection and should not be used as the sole basis for treatment or other patient management decisions. A negative result may occur with  improper specimen collection/handling, submission of specimen other than nasopharyngeal swab, presence of viral mutation(s) within the areas targeted by this assay, and inadequate number of viral copies(<138 copies/mL). A negative result must be combined with clinical observations, patient history, and epidemiological information. The expected result is Negative.  Fact Sheet for Patients:  EntrepreneurPulse.com.au  Fact Sheet for Healthcare Providers:  IncredibleEmployment.be  This test is no t yet approved or cleared by the [place] FDA and  has been authorized for detection and/or diagnosis of SARS-CoV-2 by FDA under an Emergency Use Authorization (EUA). This EUA will remain  in effect (meaning this test can be used) for the duration of the COVID-19 declaration under Section 564(b)(1) of the Act, 21 U.S.C.section 360bbb-3(b)(1), unless the authorization is terminated  or revoked sooner.       Influenza A by PCR NEGATIVE NEGATIVE Final   Influenza B by PCR NEGATIVE NEGATIVE Final    Comment: (NOTE) The Xpert Xpress SARS-CoV-2/FLU/RSV plus assay is intended as an aid in the diagnosis of influenza from Nasopharyngeal swab specimens and should not be used as a sole basis for treatment. Nasal washings and aspirates are unacceptable for Xpert Xpress  SARS-CoV-2/FLU/RSV testing.  Fact Sheet for Patients: EntrepreneurPulse.com.au  Fact Sheet for Healthcare Providers: IncredibleEmployment.be  This test is not yet approved or cleared by the [place] FDA and has been authorized for detection and/or diagnosis of SARS-CoV-2 by FDA under an Emergency Use Authorization (EUA). This  EUA will remain in effect (meaning this test can be used) for the duration of the COVID-19 declaration under Section 564(b)(1) of the Act, 21 U.S.C. section 360bbb-3(b)(1), unless the authorization is terminated or revoked.  Performed at [facility], [place] [address], [place], [place] [address]       Radiology Studies: CT Head Wo Contrast  Result Date: 12/24/2020 CLINICAL DATA:  TIA.  Headache and dizziness.  Presyncope EXAM: CT HEAD WITHOUT CONTRAST TECHNIQUE: Contiguous axial images were obtained from the base of the skull through the vertex without intravenous contrast. COMPARISON:  07/26/2010 FINDINGS: Brain: No evidence of acute infarction, hemorrhage, hydrocephalus, extra-axial collection or mass lesion/mass effect. Low-density in the cerebral white matter attributed to chronic small vessel ischemia, overall moderate. Mild for age cerebral volume loss. Senescent changes have progressed from prior. Vascular: Atheromatous calcifications Skull: Normal. Negative for fracture or focal lesion. Sinuses/Orbits: Negative IMPRESSION: 1. No acute or reversible finding. 2. Aging brain with progression from 2011. Electronically Signed   By: Monte Fantasia M.D.   On: 12/24/2020 05:05   MR BRAIN WO CONTRAST  Result Date: 12/24/2020 CLINICAL DATA:  Awoke with dizziness.  Hypertension. EXAM: MRI HEAD WITHOUT CONTRAST TECHNIQUE: Multiplanar, multiecho pulse sequences of the brain and surrounding structures were obtained without intravenous contrast. COMPARISON:  Head CT from earlier today FINDINGS: Brain: No acute infarction,  hemorrhage, hydrocephalus, extra-axial collection or mass lesion. Moderate chronic small vessel ischemia. Cerebral volume loss in keeping with aging. Vascular: Normal flow voids Skull and upper cervical spine: Normal marrow signal. 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 Sinuses/Orbits: Negative IMPRESSION: 1. No acute or reversible finding. 2. Moderate chronic small vessel ischemia. Electronically Signed   By: Monte Fantasia M.D.   On: 12/24/2020 07:16    Scheduled Meds: . amLODipine  5 mg Oral BID  . amoxicillin-clavulanate  1 tablet Oral BID  . aspirin EC  81 mg Oral Daily  . enoxaparin (LOVENOX) injection  40 mg Subcutaneous Q24H  . insulin aspart  0-15 Units Subcutaneous TID WC  . insulin aspart  0-5 Units Subcutaneous QHS  . insulin glargine  50 Units Subcutaneous Daily  . irbesartan  150 mg Oral BID  . metoprolol tartrate  100 mg Oral BID  . prasugrel  10 mg Oral Daily   Continuous Infusions:   LOS: 0 days   Time spent: 35 minutes   Darliss Cheney, MD [facility]  12/25/2020, 1:56 PM   To contact the attending provider between 7A-7P or the covering provider during after hours 7P-7A, please log into the web site [email or web address].

## 2020-12-25 NOTE — Plan of Care (Signed)
  Problem: Activity: Goal: Risk for activity intolerance will decrease Outcome: Progressing   Problem: Coping: Goal: Level of anxiety will decrease Outcome: Not Progressing

## 2020-12-25 NOTE — Progress Notes (Signed)
Progress Note  Patient Name: Ruth [name] Date of Encounter: 12/25/2020  Primary Cardiologist:   [name], MD   Subjective   She is still dizzy and her "legs were jumping" last night  Inpatient Medications    Scheduled Meds: . amLODipine  5 mg Oral BID  . amoxicillin-clavulanate  1 tablet Oral BID  . aspirin EC  81 mg Oral Daily  . enoxaparin (LOVENOX) injection  40 mg Subcutaneous Q24H  . insulin aspart  0-15 Units Subcutaneous TID WC  . insulin aspart  0-5 Units Subcutaneous QHS  . insulin glargine  50 Units Subcutaneous Daily  . irbesartan  150 mg Oral BID  . metoprolol tartrate  100 mg Oral BID  . prasugrel  10 mg Oral Daily   Continuous Infusions:  PRN Meds: hydrALAZINE   Vital Signs    Vitals:   12/24/20 1856 12/24/20 2007 12/24/20 2143 12/25/20 0855  BP: (!) 177/70 (!) 185/71 (!) 178/78 (!) 145/59  Pulse:  65    Resp:  20    Temp:  98.2 F (36.8 C)    TempSrc:  Oral    SpO2:  100%    Weight:      Height:        Intake/Output Summary (Last 24 hours) at 12/25/2020 0910 Last data filed at 12/24/2020 2345 Gross per 24 hour  Intake --  Output 1100 ml  Net -1100 ml   Filed Weights   12/24/20 0251  Weight: 83.5 kg    Telemetry    NA - Personally Reviewed  ECG    NA - Personally Reviewed  Physical Exam   GEN: No acute distress.   Neck: No  JVD Cardiac: RRR, no murmurs, rubs, or gallops.  Respiratory: Clear  to auscultation bilaterally. GI: Soft, nontender, non-distended  MS: No  edema; No deformity. Neuro:  Nonfocal  Psych: Normal affect   Labs    Chemistry Recent Labs  Lab 12/24/20 0459 12/25/20 0347  NA 138 138  K 3.8 4.6  CL 102 103  CO2 24 25  GLUCOSE 197* 178*  BUN 11 14  CREATININE 0.66 0.83  CALCIUM 9.3 9.3  PROT 6.8  --   ALBUMIN 3.7  --   AST 18  --   ALT 13  --   ALKPHOS 60  --   BILITOT 0.8  --   GFRNONAA >60 >60  ANIONGAP 12 10     Hematology Recent Labs  Lab 12/24/20 0312 12/25/20 0347   WBC 6.2 8.2  RBC 4.31 4.83  HGB 13.0 14.6  HCT 39.9 44.2  MCV 92.6 91.5  MCH 30.2 30.2  MCHC 32.6 33.0  RDW 12.8 12.9  PLT 201 265    Cardiac EnzymesNo results for input(s): TROPONINI in the last 168 hours. No results for input(s): TROPIPOC in the last 168 hours.   BNPNo results for input(s): BNP, PROBNP in the last 168 hours.   DDimer No results for input(s): DDIMER in the last 168 hours.   Radiology    CT Head Wo Contrast  Result Date: 12/24/2020 CLINICAL DATA:  TIA.  Headache and dizziness.  Presyncope EXAM: CT HEAD WITHOUT CONTRAST TECHNIQUE: Contiguous axial images were obtained from the base of the skull through the vertex without intravenous contrast. COMPARISON:  07/26/2010 FINDINGS: Brain: No evidence of acute infarction, hemorrhage, hydrocephalus, extra-axial collection or mass lesion/mass effect. Low-density in the cerebral white matter attributed to chronic small vessel ischemia, overall moderate. Mild for age cerebral volume loss.  Senescent changes have progressed from prior. Vascular: Atheromatous calcifications Skull: Normal. Negative for fracture or focal lesion. Sinuses/Orbits: Negative IMPRESSION: 1. No acute or reversible finding. 2. Aging brain with progression from 2011. Electronically Signed   By: Monte Fantasia M.D.   On: 12/24/2020 05:05   MR BRAIN WO CONTRAST  Result Date: 12/24/2020 CLINICAL DATA:  Awoke with dizziness.  Hypertension. EXAM: MRI HEAD WITHOUT CONTRAST TECHNIQUE: Multiplanar, multiecho pulse sequences of the brain and surrounding structures were obtained without intravenous contrast. COMPARISON:  Head CT from earlier today FINDINGS: Brain: No acute infarction, hemorrhage, hydrocephalus, extra-axial collection or mass lesion. Moderate chronic small vessel ischemia. Cerebral volume loss in keeping with aging. Vascular: Normal flow voids Skull and upper cervical spine: Normal marrow signal. Notable facet osteoarthritis in the visible cervical spine  with C3-4 ankylosis. Sinuses/Orbits: Negative IMPRESSION: 1. No acute or reversible finding. 2. Moderate chronic small vessel ischemia. Electronically Signed   By: Monte Fantasia M.D.   On: 12/24/2020 07:16    Cardiac Studies   NA  Patient Profile     75 y.o. female with a hx of CAD s/p CABG and subsequent PCI, mitral regurgitation, hypertension, depression, hyperlipidemia, diabetes mellitus, IBS and morbid obesity who is being seen for the evaluation of high blood pressure at the request of Dr. Lorin Mercy.  Assessment & Plan    Uncontrolled HTN:   She is very concerned about her meds and taking them all at once.  I think she can take the ARB and beta blocker as prescribed bid and take them together.  I have suggested the Norvasc (5 mg for now but could be 10 titrated as an out patient) at noon and stop the clonidine prn using hydralazine 12.5 - 25 mg q8 hours prn SBP greater than 180 mg.    Dizziness:  Per primary team.  No cardiac etiology.     For questions or updates, please contact [place] Please consult [email or web address] for contact info under Cardiology/STEMI.   Signed, Minus Breeding, MD  12/25/2020, 9:10 AM

## 2020-12-26 DIAGNOSIS — R42 Dizziness and giddiness: Secondary | ICD-10-CM | POA: Diagnosis not present

## 2020-12-26 LAB — COMPREHENSIVE METABOLIC PANEL
ALT: 14 U/L (ref 0–44)
AST: 16 U/L (ref 15–41)
Albumin: 3.3 g/dL — ABNORMAL LOW (ref 3.5–5.0)
Alkaline Phosphatase: 58 U/L (ref 38–126)
Anion gap: 8 (ref 5–15)
BUN: 20 mg/dL (ref 8–23)
CO2: 26 mmol/L (ref 22–32)
Calcium: 9.4 mg/dL (ref 8.9–10.3)
Chloride: 102 mmol/L (ref 98–111)
Creatinine, Ser: 0.81 mg/dL (ref 0.44–1.00)
GFR, Estimated: >60 mL/min (ref >60)
Glucose, Bld: 157 mg/dL — ABNORMAL HIGH (ref 70–99)
Potassium: 3.8 mmol/L (ref 3.5–5.1)
Sodium: 136 mmol/L (ref 135–145)
Total Bilirubin: 0.8 mg/dL (ref 0.3–1.2)
Total Protein: 6 g/dL — ABNORMAL LOW (ref 6.5–8.1)

## 2020-12-26 LAB — GLUCOSE, CAPILLARY
Glucose-Capillary: 151 mg/dL — ABNORMAL HIGH (ref 70–99)
Glucose-Capillary: 139 mg/dL — ABNORMAL HIGH (ref 70–99)

## 2020-12-26 MED ORDER — AMLODIPINE BESYLATE 5 MG PO TABS: 5.0000 mg | ORAL_TABLET | Freq: Two times a day (BID) | ORAL | 3 refills | Status: DC

## 2020-12-26 NOTE — Discharge Instructions (Signed)

## 2020-12-26 NOTE — Discharge Summary (Signed)
Physician Discharge Summary  Ruth [name] LSL:[id number] DOB: 1946-09-01 DOA: 12/24/2020  PCP: Crist Infante, MD  Admit date: 12/24/2020 Discharge date: 12/26/2020    Admitted From: Home Disposition: Home  Recommendations for Outpatient Follow-up:  1. Follow up with PCP in 1-2 weeks 2. Follow-up with your cardiologist in 1 to 2 weeks 3. Please obtain BMP/CBC in one week 4. Please follow up with your PCP on the following pending results: Unresulted Labs (From admission, onward)         None        Home Health: Yes Equipment/Devices: None  Discharge Condition: Stable CODE STATUS: Full code Diet recommendation: Cardiac  Subjective: Seen and examined.  Still feels little dizzy but she states that she feels a lot better and she is now comfortable going home.  She has no other complaint.  Brief/Interim Summary: Ruth L Kuczewskiis a 75 y.o.femalewith medical history significant ofDM; obesity; labile HTN; HLD; CAD s/p CABG; and depression/anxiety presented with uncontrolled HTN.She has been having BP issues for 3 weeks - fine in AMs, spikes up at night (as high as 233/100). She has been trying to get into cardiology without success - has appt on 5/11. She is dizzy with leaning/standing. She had a CT with urology and was given Augmentin for an infection; UA today is clear.  She had CT head and MRI brain which were negative as well.  Patient was admitted under hospital service due to dizziness and labile hypertension.  Cardiology was consulted.  She was taking clonidine as needed.  We had stopped that.  We had adjusted her medications.  Her blood pressure is now fairly stable and controlled and within normal range.  She was also seen by PT for possible vestibular component causing vertigo however per PT assessment, she did not have any vestibular component.  She does not have any nystagmus on examination.  Her dizziness has improved significantly.  PT recommended home with home  health.  Patient is agreeable with the discharge as well.  She understand that she is supposed to take her ARB and beta-blocker in the morning and at night together however she is supposed to take her amlodipine at noon and at 4 PM.  Part of the reason of her dizziness is likely psychosomatic and anxiety.  Discharge Diagnoses:  Principal Problem:   Dizziness Active Problems:   Coronary artery disease   Hyperlipidemia with target LDL less than 70   Diabetes mellitus (HCC)   Generalized abdominal pain   Obesity (BMI 30-39.9)   HTN (hypertension)   Multiple drug allergies    Discharge Instructions   Allergies as of 12/26/2020      Reactions   Betadine [povidone Iodine] Shortness Of Breath, Swelling   Codeine Anaphylaxis, Shortness Of Breath   "quit breathing" (07/28/2012) Tolerates 1-2 shots of morphine   Demerol Anaphylaxis   "quit breathing" (07/28/2012)   Iohexol Anaphylaxis   Finger/ankle swelling   Latex Anaphylaxis   "quit breathing" (07/28/2012)   Other Anaphylaxis   Perfume, Any Fragrance. Cleaning Fluids.  "quit breathing" (07/28/2012)   Percocet [oxycodone-acetaminophen] Anaphylaxis   "quit breathing; disoriented" (07/28/2012)   Plavix [clopidogrel Bisulfate] Anaphylaxis   "get hot; like I'm burning up inside; had to put me in shower after OHS because of that" (07/28/2012)   Red Dye Anaphylaxis   "quit breathing" (07/28/2012)   Shellfish Allergy Shortness Of Breath   "broke out in knots all over" (07/28/2012)   Sulfonamide Derivatives Shortness Of Breath, Rash   "quit  breathing" (07/28/2012)   Tylenol [acetaminophen] Shortness Of Breath, Itching   Azilsartan    Other reaction(s): muscle pain   Azithromycin    Other reaction(s): she cannot take it. felt like she would die. legs would not work , painful and had to get in the shower and had diarrhea.   Barbiturates    Other reaction(s): SOB   Cardizem [diltiazem]    Other reaction(s): took one dose.  felt very  sick, bad. said raised bp more.   Ciprofloxacin    Other reaction(s): after one does had charlie horses all over her legs and could not go back to sleep   Coumadin [warfarin]    Other reaction(s): CAN'T TAKE, GETS HOT/BREAKS OUT   Crestor [rosuvastatin]    Other reaction(s): RASH   Ezetimibe    Other reaction(s): RASH   Fluogen [influenza Virus Vaccine]    Other reaction(s): gets very sick and thinks it is the mercury level.   Furosemide    Other reaction(s): makes her too tired.   Hydrochlorothiazide    Other reaction(s): broke out with blisters on her mouth   Omega-3-acid Ethyl Esters    Other reaction(s): bad diarrhea   Pravastatin Sodium Other (See Comments)   Leg pain, problems ambulating    Spironolactone    Other reaction(s): states she doesn't want to take it as it made her dehydrated in the past.   Statins    Muscle pain   Sulfa Antibiotics    Other reaction(s): swelling/SOB   Vancomycin    Other reaction(s): pt states it caused her major abd pain   Imdur [isosorbide Nitrate] Other (See Comments)   Intolerance. "Can't remember the reaction."      Medication List    STOP taking these medications   cloNIDine 0.1 MG tablet Commonly known as: CATAPRES     TAKE these medications   amLODipine 5 MG tablet Commonly known as: NORVASC Take 1 tablet (5 mg total) by mouth 2 (two) times daily at 10 am and 4 pm. What changed: when to take this   amoxicillin-clavulanate 500-125 MG tablet Commonly known as: AUGMENTIN Take 1 tablet by mouth See admin instructions. Bid x 5 days   aspirin EC 81 MG tablet Take 81 mg by mouth daily.   BD Pen Needle Nano 2nd Gen 32G X 4 MM Misc Generic drug: Insulin Pen Needle USE THREE TIMES DAILY AS DIRECTED WITH HUMALOG AND TOUJEO   calcium carbonate 500 MG chewable tablet Commonly known as: TUMS - dosed in mg elemental calcium Chew 1-2 tablets by mouth 3 (three) times daily as needed for heartburn.   cholecalciferol 1000 units  tablet Commonly known as: VITAMIN D Take 1,000 Units by mouth daily.   CRANBERRY-VITAMIN C PO Take 1 tablet by mouth daily.   EPINEPHrine 0.3 mg/0.3 mL Soaj injection Commonly known as: EPI-PEN Inject 0.3 mg into the muscle as needed.   HumaLOG KwikPen 100 UNIT/ML KwikPen Generic drug: insulin lispro Inject 5 Units into the skin in the morning and at bedtime.   hydrALAZINE 25 MG tablet Commonly known as: APRESOLINE Take 1 tablet (25 mg total) by mouth every 8 (eight) hours as needed (for SBP > 160).   Magnesium 250 MG Tabs Take 250 mg by mouth daily.   metoprolol tartrate 100 MG tablet Commonly known as: LOPRESSOR TAKE 1 TABLET(100 MG) BY MOUTH TWICE DAILY What changed: See the new instructions.   nitrofurantoin (macrocrystal-monohydrate) 100 MG capsule Commonly known as: MACROBID Take 100 mg by mouth  daily.   nitroGLYCERIN 0.4 MG SL tablet Commonly known as: Nitrostat Place 1 tablet (0.4 mg total) under the tongue every 5 (five) minutes as needed for chest pain (up to 3 doses).   OneTouch Delica Plus [id number] Misc USE TO CHECK BLOOD SUGAR FOUR TIMES DAILY   OneTouch Verio test strip Generic drug: glucose blood   Praluent 75 MG/ML Soaj Generic drug: Alirocumab ADMINISTER 1 ML UNDER THE SKIN EVERY 14 DAYS What changed: See the new instructions.   prasugrel 10 MG Tabs tablet Commonly known as: EFFIENT Take 1 tablet (10 mg total) by mouth daily.   PROBIOTIC-10 PO Take 1 capsule by mouth daily.   TOUJEO MAX SOLOSTAR Index Inject 50 Units into the skin daily.   valsartan 160 MG tablet Commonly known as: DIOVAN TAKE 1 TABLET(160 MG) BY MOUTH TWICE DAILY What changed: See the new instructions.   vitamin B-12 1000 MCG tablet Commonly known as: CYANOCOBALAMIN Take 1,000 mcg by mouth daily.       Follow-up Information    Crist Infante, MD Follow up in 1 week(s).   Specialty: Internal Medicine Contact information: [place]  [id number] [phone]        Troy Sine, MD .   Specialty: Cardiology Contact information: [address] [place] [address] [phone]              Allergies  Allergen Reactions  . Betadine [Povidone Iodine] Shortness Of Breath and Swelling  . Codeine Anaphylaxis and Shortness Of Breath    "quit breathing" (07/28/2012) Tolerates 1-2 shots of morphine  . Demerol Anaphylaxis    "quit breathing" (07/28/2012)  . Iohexol Anaphylaxis    Finger/ankle swelling  . Latex Anaphylaxis    "quit breathing" (07/28/2012)  . Other Anaphylaxis    Perfume, Any Fragrance. Cleaning Fluids.  "quit breathing" (07/28/2012)  . Percocet [Oxycodone-Acetaminophen] Anaphylaxis    "quit breathing; disoriented" (07/28/2012)  . Plavix [Clopidogrel Bisulfate] Anaphylaxis    "get hot; like I'm burning up inside; had to put me in shower after OHS because of that" (07/28/2012)  . Red Dye Anaphylaxis    "quit breathing" (07/28/2012)  . Shellfish Allergy Shortness Of Breath    "broke out in knots all over" (07/28/2012)  . Sulfonamide Derivatives Shortness Of Breath and Rash    "quit breathing" (07/28/2012)  . Tylenol [Acetaminophen] Shortness Of Breath and Itching  . Azilsartan     Other reaction(s): muscle pain  . Azithromycin     Other reaction(s): she cannot take it. felt like she would die. legs would not work , painful and had to get in the shower and had diarrhea.  . Barbiturates     Other reaction(s): SOB  . Cardizem [Diltiazem]     Other reaction(s): took one dose.  felt very sick, bad. said raised bp more.  . Ciprofloxacin     Other reaction(s): after one does had charlie horses all over her legs and could not go back to sleep  . Coumadin [Warfarin]     Other reaction(s): CAN'T TAKE, GETS HOT/BREAKS OUT  . Crestor [Rosuvastatin]     Other reaction(s): RASH  . Ezetimibe     Other reaction(s): RASH  . Fluogen [Influenza Virus Vaccine]     Other reaction(s): gets  very sick and thinks it is the mercury level.  . Furosemide     Other reaction(s): makes her too tired.  . Hydrochlorothiazide     Other reaction(s): broke out with blisters on  her mouth  . Omega-3-Acid Ethyl Esters     Other reaction(s): bad diarrhea  . Pravastatin Sodium Other (See Comments)    Leg pain, problems ambulating   . Spironolactone     Other reaction(s): states she doesn't want to take it as it made her dehydrated in the past.  . Statins     Muscle pain  . Sulfa Antibiotics     Other reaction(s): swelling/SOB  . Vancomycin     Other reaction(s): pt states it caused her major abd pain  . Imdur [Isosorbide Nitrate] Other (See Comments)    Intolerance. "Can't remember the reaction."    Consultations: Cardiology   Procedures/Studies: CT Head Wo Contrast  Result Date: 12/24/2020 CLINICAL DATA:  TIA.  Headache and dizziness.  Presyncope EXAM: CT HEAD WITHOUT CONTRAST TECHNIQUE: Contiguous axial images were obtained from the base of the skull through the vertex without intravenous contrast. COMPARISON:  07/26/2010 FINDINGS: Brain: No evidence of acute infarction, hemorrhage, hydrocephalus, extra-axial collection or mass lesion/mass effect. Low-density in the cerebral white matter attributed to chronic small vessel ischemia, overall moderate. Mild for age cerebral volume loss. Senescent changes have progressed from prior. Vascular: Atheromatous calcifications Skull: Normal. Negative for fracture or focal lesion. Sinuses/Orbits: Negative IMPRESSION: 1. No acute or reversible finding. 2. Aging brain with progression from 2011. Electronically Signed   By: Monte Fantasia M.D.   On: 12/24/2020 05:05   MR BRAIN WO CONTRAST  Result Date: 12/24/2020 CLINICAL DATA:  Awoke with dizziness.  Hypertension. EXAM: MRI HEAD WITHOUT CONTRAST TECHNIQUE: Multiplanar, multiecho pulse sequences of the brain and surrounding structures were obtained without intravenous contrast. COMPARISON:  Head CT  from earlier today FINDINGS: Brain: No acute infarction, hemorrhage, hydrocephalus, extra-axial collection or mass lesion. Moderate chronic small vessel ischemia. Cerebral volume loss in keeping with aging. Vascular: Normal flow voids Skull and upper cervical spine: Normal marrow signal. 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 Sinuses/Orbits: Negative IMPRESSION: 1. No acute or reversible finding. 2. Moderate chronic small vessel ischemia. Electronically Signed   By: Monte Fantasia M.D.   On: 12/24/2020 07:16      Discharge Exam: Vitals:   12/25/20 2340 12/26/20 0627  BP: (!) 159/71 (!) 122/57  Pulse: 81 60  Resp: 18 16  Temp:  97.9 F (36.6 C)  SpO2:  95%   Vitals:   12/25/20 1224 12/25/20 2035 12/25/20 2340 12/26/20 0627  BP: 130/69 (!) 157/72 (!) 159/71 (!) 122/57  Pulse: 61 78 81 60  Resp: 17 18 18 16   Temp: 98.5 F (36.9 C) 98.4 F (36.9 C)  97.9 F (36.6 C)  TempSrc: Oral Oral    SpO2: 98%   95%  Weight:      Height:        General: Pt is alert, awake, not in acute distress Cardiovascular: RRR, S1/S2 +, no rubs, no gallops Respiratory: CTA bilaterally, no wheezing, no rhonchi Abdominal: Soft, NT, ND, bowel sounds + Extremities: no edema, no cyanosis    The results of significant diagnostics from this hospitalization (including imaging, microbiology, ancillary and laboratory) are listed below for reference.     Microbiology: Recent Results (from the past 240 hour(s))  Resp Panel by RT-PCR (Flu A&B, Covid) Nasopharyngeal Swab     Status: None   Collection Time: 12/24/20  3:13 AM   Specimen: Nasopharyngeal Swab; Nasopharyngeal(NP) swabs in vial transport medium  Result Value Ref Range Status   SARS Coronavirus 2 by RT PCR NEGATIVE NEGATIVE  Final    Comment: (NOTE) SARS-CoV-2 target nucleic acids are NOT DETECTED.  The SARS-CoV-2 RNA is generally detectable in upper respiratory specimens during the acute phase of  infection. The lowest concentration of SARS-CoV-2 viral copies this assay can detect is 138 copies/mL. A negative result does not preclude SARS-Cov-2 infection and should not be used as the sole basis for treatment or other patient management decisions. A negative result may occur with  improper specimen collection/handling, submission of specimen other than nasopharyngeal swab, presence of viral mutation(s) within the areas targeted by this assay, and inadequate number of viral copies(<138 copies/mL). A negative result must be combined with clinical observations, patient history, and epidemiological information. The expected result is Negative.  Fact Sheet for Patients:  EntrepreneurPulse.com.au  Fact Sheet for Healthcare Providers:  IncredibleEmployment.be  This test is no t yet approved or cleared by the [place] FDA and  has been authorized for detection and/or diagnosis of SARS-CoV-2 by FDA under an Emergency Use Authorization (EUA). This EUA will remain  in effect (meaning this test can be used) for the duration of the COVID-19 declaration under Section 564(b)(1) of the Act, 21 U.S.C.section 360bbb-3(b)(1), unless the authorization is terminated  or revoked sooner.       Influenza A by PCR NEGATIVE NEGATIVE Final   Influenza B by PCR NEGATIVE NEGATIVE Final    Comment: (NOTE) The Xpert Xpress SARS-CoV-2/FLU/RSV plus assay is intended as an aid in the diagnosis of influenza from Nasopharyngeal swab specimens and should not be used as a sole basis for treatment. Nasal washings and aspirates are unacceptable for Xpert Xpress SARS-CoV-2/FLU/RSV testing.  Fact Sheet for Patients: EntrepreneurPulse.com.au  Fact Sheet for Healthcare Providers: IncredibleEmployment.be  This test is not yet approved or cleared by the [place] FDA and has been authorized for detection and/or diagnosis of SARS-CoV-2  by FDA under an Emergency Use Authorization (EUA). This EUA will remain in effect (meaning this test can be used) for the duration of the COVID-19 declaration under Section 564(b)(1) of the Act, 21 U.S.C. section 360bbb-3(b)(1), unless the authorization is terminated or revoked.  Performed at [facility], [place] [address], [place], [place] [address]      Labs: BNP (last 3 results) No results for input(s): BNP in the last 8760 hours. Basic Metabolic Panel: Recent Labs  Lab 12/24/20 0459 12/25/20 0347 12/26/20 0143  NA 138 138 136  K 3.8 4.6 3.8  CL 102 103 102  CO2 24 25 26   GLUCOSE 197* 178* 157*  BUN 11 14 20   CREATININE 0.66 0.83 0.81  CALCIUM 9.3 9.3 9.4   Liver Function Tests: Recent Labs  Lab 12/24/20 0459 12/26/20 0143  AST 18 16  ALT 13 14  ALKPHOS 60 58  BILITOT 0.8 0.8  PROT 6.8 6.0*  ALBUMIN 3.7 3.3*   No results for input(s): LIPASE, AMYLASE in the last 168 hours. No results for input(s): AMMONIA in the last 168 hours. CBC: Recent Labs  Lab 12/24/20 0312 12/25/20 0347  WBC 6.2 8.2  NEUTROABS 4.5  --   HGB 13.0 14.6  HCT 39.9 44.2  MCV 92.6 91.5  PLT 201 265   Cardiac Enzymes: No results for input(s): CKTOTAL, CKMB, CKMBINDEX, TROPONINI in the last 168 hours. BNP: Invalid input(s): POCBNP CBG: Recent Labs  Lab 12/25/20 0734 12/25/20 1148 12/25/20 1610 12/25/20 2149 12/26/20 0732  GLUCAP 144* 137* 140* 174* 151*   D-Dimer No results for input(s): DDIMER in the last 72 hours. Hgb  A1c Recent Labs    12/25/20 0347  HGBA1C 7.9*   Lipid Profile No results for input(s): CHOL, HDL, LDLCALC, TRIG, CHOLHDL, LDLDIRECT in the last 72 hours. Thyroid function studies No results for input(s): TSH, T4TOTAL, T3FREE, THYROIDAB in the last 72 hours.  Invalid input(s): FREET3 Anemia work up No results for input(s): VITAMINB12, FOLATE, FERRITIN, TIBC, IRON, RETICCTPCT in the last 72 hours. Urinalysis    Component Value Date/Time    COLORURINE STRAW (A) 12/24/2020 0315   APPEARANCEUR CLEAR 12/24/2020 0315   APPEARANCEUR Clear 08/21/2019 1228   LABSPEC 1.008 12/24/2020 0315   PHURINE 8.0 12/24/2020 0315   GLUCOSEU 150 (A) 12/24/2020 0315   GLUCOSEU NEGATIVE 11/09/2019 1432   HGBUR NEGATIVE 12/24/2020 0315   BILIRUBINUR NEGATIVE 12/24/2020 0315   BILIRUBINUR Negative 08/21/2019 1228   KETONESUR NEGATIVE 12/24/2020 0315   PROTEINUR NEGATIVE 12/24/2020 0315   UROBILINOGEN 0.2 11/09/2019 1432   NITRITE NEGATIVE 12/24/2020 0315   LEUKOCYTESUR NEGATIVE 12/24/2020 0315   Sepsis Labs Invalid input(s): PROCALCITONIN,  WBC,  LACTICIDVEN Microbiology Recent Results (from the past 240 hour(s))  Resp Panel by RT-PCR (Flu A&B, Covid) Nasopharyngeal Swab     Status: None   Collection Time: 12/24/20  3:13 AM   Specimen: Nasopharyngeal Swab; Nasopharyngeal(NP) swabs in vial transport medium  Result Value Ref Range Status   SARS Coronavirus 2 by RT PCR NEGATIVE NEGATIVE Final    Comment: (NOTE) SARS-CoV-2 target nucleic acids are NOT DETECTED.  The SARS-CoV-2 RNA is generally detectable in upper respiratory specimens during the acute phase of infection. The lowest concentration of SARS-CoV-2 viral copies this assay can detect is 138 copies/mL. A negative result does not preclude SARS-Cov-2 infection and should not be used as the sole basis for treatment or other patient management decisions. A negative result may occur with  improper specimen collection/handling, submission of specimen other than nasopharyngeal swab, presence of viral mutation(s) within the areas targeted by this assay, and inadequate number of viral copies(<138 copies/mL). A negative result must be combined with clinical observations, patient history, and epidemiological information. The expected result is Negative.  Fact Sheet for Patients:  EntrepreneurPulse.com.au  Fact Sheet for Healthcare Providers:   IncredibleEmployment.be  This test is no t yet approved or cleared by the [place] FDA and  has been authorized for detection and/or diagnosis of SARS-CoV-2 by FDA under an Emergency Use Authorization (EUA). This EUA will remain  in effect (meaning this test can be used) for the duration of the COVID-19 declaration under Section 564(b)(1) of the Act, 21 U.S.C.section 360bbb-3(b)(1), unless the authorization is terminated  or revoked sooner.       Influenza A by PCR NEGATIVE NEGATIVE Final   Influenza B by PCR NEGATIVE NEGATIVE Final    Comment: (NOTE) The Xpert Xpress SARS-CoV-2/FLU/RSV plus assay is intended as an aid in the diagnosis of influenza from Nasopharyngeal swab specimens and should not be used as a sole basis for treatment. Nasal washings and aspirates are unacceptable for Xpert Xpress SARS-CoV-2/FLU/RSV testing.  Fact Sheet for Patients: EntrepreneurPulse.com.au  Fact Sheet for Healthcare Providers: IncredibleEmployment.be  This test is not yet approved or cleared by the [place] FDA and has been authorized for detection and/or diagnosis of SARS-CoV-2 by FDA under an Emergency Use Authorization (EUA). This EUA will remain in effect (meaning this test can be used) for the duration of the COVID-19 declaration under Section 564(b)(1) of the Act, 21 U.S.C. section 360bbb-3(b)(1), unless the authorization is terminated or revoked.  Performed  at [facility], [place] [address], [place], [place] [address]      Time coordinating discharge: Over 30 minutes  SIGNED:   Darliss Cheney, MD  [facility] 12/26/2020, 8:51 AM  If 7PM-7AM, please contact night-coverage [email or web address]

## 2020-12-26 NOTE — TOC Initial Note (Signed)
Transition of Care [facility]) - Initial/Assessment Note    Patient Details  Name: Ruth [name] MRN: [id number] Date of Birth: 1946/04/17  Transition of [facility] CM/SW Contact:    Joanne Chars, LCSW Phone Number: 12/26/2020, 11:58 AM  Clinical Narrative:   CSW met with pt regarding [place] referral.  Pt declines HH, states she does not need it.  Pt already has walker in the home, is planning to follow up with PCP.  Son is coming to pick her up. No other needs identified.                 Expected Discharge Plan: Home/Self Care Barriers to Discharge: No Barriers Identified   Patient Goals and CMS Choice Patient states their goals for this hospitalization and ongoing recovery are:: "not be dizzy" CMS Medicare.gov Compare Post Acute Care list provided to::  (na)    Expected Discharge Plan and Services Expected Discharge Plan: Home/Self Care     Post Acute Care Choice:  (Pt declined [facility] referral) Living arrangements for the past 2 months: Single Family Home Expected Discharge Date: 12/26/20               DME Arranged: N/A         HH Arranged: Patient Refused HH          Prior Living Arrangements/Services Living arrangements for the past 2 months: Single Family Home Lives with:: Adult Children Patient language and need for interpreter reviewed:: Yes        Need for Family Participation in Patient Care: No (Comment) Care giver support system in place?: Yes (comment) Current home services: Other (comment) (none) Criminal Activity/Legal Involvement Pertinent to Current Situation/Hospitalization: No - Comment as needed  Activities of Daily Living Home Assistive Devices/Equipment: Walker (specify type) ADL Screening (condition at time of admission) Patient's cognitive ability adequate to safely complete daily activities?: Yes Is the patient deaf or have difficulty hearing?: No Does the patient have difficulty seeing, even when wearing glasses/contacts?: No Does the  patient have difficulty concentrating, remembering, or making decisions?: No Patient able to express need for assistance with ADLs?: Yes Does the patient have difficulty dressing or bathing?: No Independently performs ADLs?: Yes (appropriate for developmental age) Does the patient have difficulty walking or climbing stairs?: No Weakness of Legs: None Weakness of Arms/Hands: None  Permission Sought/Granted                  Emotional Assessment Appearance:: Appears stated age Attitude/Demeanor/Rapport: Engaged Affect (typically observed): Appropriate,Pleasant Orientation: : Oriented to Self,Oriented to Place,Oriented to  Time,Oriented to Situation Alcohol / Substance Use: Not Applicable Psych Involvement: No (comment)  Admission diagnosis:  Dizziness [R42] Hypertensive urgency [I16.0] Patient Active Problem List   Diagnosis Date Noted  . Dizziness 12/24/2020  . Multiple drug allergies 12/24/2020  . Non-ST elevation (NSTEMI) myocardial infarction ([place])   . Chest pain 06/21/2019  . IBS (irritable bowel syndrome) 06/21/2019  . Hyperbilirubinemia 06/21/2019  . GERD (gastroesophageal reflux disease) 06/21/2019  . Abnormal nuclear stress test   . Hypertensive heart disease 05/25/2015  . Type II or unspecified type diabetes mellitus without mention of complication, uncontrolled 02/14/2014  . Hyponatremia 02/13/2014  . HTN (hypertension) 02/13/2014  . Sepsis secondary to UTI ([place]) 02/12/2014  . Acute pyelonephritis 02/12/2014  . Unstable angina ([place]) 07/29/2012  . Obesity (BMI 30-39.9) 07/29/2012  . Bloating 06/15/2012  . Generalized abdominal pain 06/15/2012  . Family history of colon cancer 06/15/2012  . 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 Coronary artery disease 12/30/2010  . Hyperlipidemia with target LDL less than 70 12/30/2010  . Diabetes mellitus ([place]) 12/30/2010  . RECTAL INCONTINENCE 06/13/2008   PCP:  Crist Infante,  MD Pharmacy:   [facility] [place], [address] [place] [address] [place] [address] Phone: [phone] Fax: [phone]     Social Determinants of Health (SDOH) Interventions    Readmission Risk Interventions No flowsheet data found.

## 2020-12-28 ENCOUNTER — Telehealth: Payer: Self-pay | Admitting: Student

## 2020-12-28 DIAGNOSIS — I10 Essential (primary) hypertension: Secondary | ICD-10-CM

## 2020-12-28 MED ORDER — AMLODIPINE BESYLATE 5 MG PO TABS: 5.0000 mg | ORAL_TABLET | Freq: Every day | ORAL | 3 refills | Status: DC

## 2020-12-28 NOTE — Telephone Encounter (Signed)
   Patient called Answering Service about BP concerns. Recently admitted with hypertensive urgency and patient's medications were adjusted. Called and spoke with patient. She is concerned that her BP is dropping and wanted to make sure it was OK to take her medications as prescribed. She is currently taking Valsartan 122m and Lopressor 1056min the morning and evening and then Amlodipine 53m39mt lunch (on discharge summary it listed that she should take this at 10am and 4pm but patient states she is only taking at lunch). BP was 120/59 earlier this morning. She took her Lopressor and Amlodipine today but did not take the Valsartan because she was afraid it would cause her to by hypotensive.  BP was initially quite high yesterday with systolic BP as high as the 200's (but on recheck 5 minutes later it was in the 170's). BP trended down the rest of the day. started to She still has some mild dizziness but much improved from where it was in the hospital. No chest pain or new/worsening shortness of breath.  She re-took BP while on the phone and it was 129/60's. I reassured patient that I think she will be fine to take her medications as she has been taking them (Lopressor and Valsartan BID and Amlodipine at lunch).  Patient admitted she was just very anxious and worried about her BP. Encouraged patient to continue to keep a log of BP and HR to bring to follow-up visit and told her to call us [place] she has worsening dizziness/lightheadedness or has concerns with BP being too low. Patient voiced understanding and was very thankful for the call.  Of note, I will update the Amlodipine in our med list to reflect how patient is taking it.  CalDarreld McleanA-C 12/28/2020 11:27 AM

## 2021-01-15 ENCOUNTER — Ambulatory Visit: Payer: Medicare Other | Admitting: General Practice

## 2021-01-17 ENCOUNTER — Telehealth: Payer: Self-pay | Admitting: Cardiovascular Disease

## 2021-01-17 MED ORDER — HYDRALAZINE HCL 10 MG PO TABS: ORAL_TABLET | ORAL | 3 refills | Status: DC

## 2021-01-17 MED ORDER — HYDRALAZINE HCL 10 MG PO TABS: ORAL_TABLET | ORAL | 1 refills | Status: DC

## 2021-01-17 NOTE — Telephone Encounter (Signed)
Okay to prescribe hydrazine 32m, but we can not guarantee the tables will be white. Only thing we can do is send Rx with a note to pharmacy.  Different manufacturers can use different dye.  Okay for her to take extra amlodipine is needed. She is on;ly on 542mdaily and maximum daily dose is 1068may

## 2021-01-17 NOTE — Telephone Encounter (Signed)
Received verbal clarification for hydralazine 10 mg- take 1-2 tablet every 8 hours as needed for systolic greater than 008. Pt made aware of Pharm D recommendations and verbalized understanding.

## 2021-01-17 NOTE — Telephone Encounter (Signed)
Pt state she was recently admitted for hypertensive urgency and was prescribed hydralazine 25 mg as needed for systolic greater than 778. Pt state ED physician recommended to start out with only 1/2 tablet due to high sensitive to a lot of medications. Pt report last night BP was 220/91 and when she attempted to take medication, she realized it was too small to cut. She report she took an additional 2.5 mg of amlodipine instead. This morning BP 148/68. Pt questioning if hydralazine could be prescribed as 10 mg and only white pills because she is allergic to dye.      Will forward to pharm D for recommendations.

## 2021-01-17 NOTE — Telephone Encounter (Signed)
Pt c/o medication issue:  1. Name of Medication: hydrALAZINE (APRESOLINE) 25 MG tablet  2. How are you currently taking this medication (dosage and times per day)? Take 1 tablet (25 mg total) by mouth every 8 (eight) hours as needed (for SBP > 160).  3. Are you having a reaction (difficulty breathing--STAT)? No  4. What is your medication issue? Pt wants to know if she can have this med in a 25m tablet and also she needs for this pill to be white because she is allergic to dye. Pt wants to know if it's okay to take this med with all of her other daily meds. Please advise

## 2021-01-29 ENCOUNTER — Encounter: Payer: Self-pay | Admitting: Cardiovascular Disease

## 2021-01-29 ENCOUNTER — Other Ambulatory Visit: Payer: Self-pay

## 2021-01-29 ENCOUNTER — Ambulatory Visit: Payer: Medicare Other | Admitting: Cardiovascular Disease

## 2021-01-29 VITALS — BP 146/80 | HR 65 | Ht 61.5 in | Wt 187.4 lb

## 2021-01-29 DIAGNOSIS — Z79899 Other long term (current) drug therapy: Secondary | ICD-10-CM

## 2021-01-29 DIAGNOSIS — E785 Hyperlipidemia, unspecified: Secondary | ICD-10-CM | POA: Diagnosis not present

## 2021-01-29 DIAGNOSIS — Z789 Other specified health status: Secondary | ICD-10-CM | POA: Diagnosis not present

## 2021-01-29 DIAGNOSIS — I10 Essential (primary) hypertension: Secondary | ICD-10-CM | POA: Diagnosis not present

## 2021-01-29 DIAGNOSIS — I2510 Atherosclerotic heart disease of native coronary artery without angina pectoris: Secondary | ICD-10-CM | POA: Diagnosis not present

## 2021-01-29 DIAGNOSIS — K5790 Diverticulosis of intestine, part unspecified, without perforation or abscess without bleeding: Secondary | ICD-10-CM

## 2021-01-29 MED ORDER — AMLODIPINE BESYLATE 10 MG PO TABS: 10.0000 mg | ORAL_TABLET | Freq: Every morning | ORAL | 3 refills | Status: DC

## 2021-01-29 NOTE — Patient Instructions (Signed)
Medication Instructions:  INCREASE amlodipine to 10 mg once daily in the morning  *If you need a refill on your cardiac medications before your next appointment, please call your pharmacy*  Follow-Up: At [facility], you and your health needs are our priority.  As part of our continuing mission to provide you with exceptional heart care, we have created designated Provider Care Teams.  These Care Teams include your primary Cardiologist (physician) and Advanced Practice Providers (APPs -  Physician Assistants and Nurse Practitioners) who all work together to provide you with the care you need, when you need it.  We recommend signing up for the patient portal called "MyChart".  Sign up information is provided on this After Visit Summary.  MyChart is used to connect with patients for Virtual Visits (Telemedicine).  Patients are able to view lab/test results, encounter notes, upcoming appointments, etc.  Non-urgent messages can be sent to your provider as well.   To learn more about what you can do with MyChart, go to [email or web address].    Your next appointment:   6 month(s)  The format for your next appointment:   In Person  Provider:   [name], MD

## 2021-01-29 NOTE — Progress Notes (Signed)
Patient ID: Ruth [name], female   DOB: [birth year], 75 y.o.   MRN: [id number]   Primary M.D.: Dr. Crist Infante  HPI: [name] is a 75 year old female who presents for a 6 month follow-up cardiology evaluation.  Ms Ruth [name] has a history of coronary artery disease.  While living in [place] she developed chest discomfort and in 29-Oct-2004 underwent CABG surgery 4 at the [facility] in [place] by Dr. Marzetta Merino.  She had a LIMA to LAD, SVG to diagonal, SVG to obtuse marginal, and SVG to the RCA.   Her husband died in 10-29-2010 and she moved to the [place] area.  In 10-30-11, she experienced symptoms of increasing shortness of breath.  She underwent cardiac catheterization and a stent was placed in the vein graft to her RCA.  She states that she has not had any subsequent stress testing.  She is a former patient of Dr. Einar Gip and an echocardiogram in July 2014 showed normal LV size and function with mild concentric left ventricular hypertrophy.  She had mild to moderate mitral regurgitation, mild tricuspid regurgitation, and mild primary hypertension with an estimated PA pressure 39 mm.  She has a history of hypertension for at least 15-20 years.  She states her blood pressure at home typically is in the 145-150 range, but her blood pressure is always elevated when she goes to the doctor's office.  She has a history of hyperlipidemia and apparently has been intolerant to statins but has never tried Zetia.  There is a history of diabetes mellitus but is not on therapy and states this is diet controlled.    She admits to  increased stress.  Her mother died in 11/27/2014 and recently her dog died.  She does not routinely exercise.  When I initially saw her in June 2016 she has been taking amlodipine 5 mg, losartan 50 mg twice a day and metoprolol 50 mrem twice a day for hypertension and CAD.  She has a history of multiple allergies.  She states that she is Intolerant to amlodipine as  well as Spironolactone.  She has been having labile blood pressure.  In the past. She became dehydrated on diarrhetic therapy.    An echo Doppler study on 03/13/2015  showed an ejection fraction at 55-60%.  There was mild mitral regurgitation, mild left atrial dilatation, and very mild pulmonary hypertension with an estimated PA pressure 32 mm.  A nuclear perfusion study done on 03/21/2015 was low risk and demonstrated a very small region of mild ischemia in the basal anterolateral wall.  She had normal LV function with an EF of 65%.  She developed recurrent episodes of chest pain in June 2018 which  led to her undergoing definitive cardiac catheterization on 03/01/2017.  She was found to have preserved global LV contractility with an EF of 55-60%.  There was significant multivessel CAD with 70% diffuse proximal LAD stenoses, total occlusion of the circumflex and OM1 proximally, and 75% proximal RCA stenosis with occlusion of the RCA prior to the acute margin.  She had a patent LIMA graft supplying the mid LAD, which may also anastomose into the diagonal vessel, but this was uncertain.  The vein graft supplying the distal marginal circumflex branch had smooth 60% proximal to mid body stenosis with TIMI-3 flow.  The vein graft supplying the distal RCA had focal 60% stenosis followed by 99% thrombotic stenosis beyond a stented segment.  She underwent successful PCI to this vein graft to  the RCA with insertion of a 3.532 mm Synergy stent postdilated to 3.71 mm with a Radiation protection practitioner used for distal graft protection.  It was recommended that she continue to antiplatelet therapy indefinitely.  [name] has significant allergies to multiple medications.  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  She has been on losartan 50 Milligan grams twice a day, metoprolol 100 mg twice a day, and continues to take  aspirin 81 mg and Effient 10 mg.  She did not tolerate Plavix or Brilinta.  She states that she is intolerant to statins.  She has no interest in PCSK9 inhibition.    Since I saw her in October 2018, she was seen in the office at least 5 times by extenders as well as pharmacists.  She is intolerant to numerous medications.  She last saw Ruth [name], Ruth [name] on December 02, 2017 who suggested the possibility of minoxidil but she continued to deny this medication change.  She states most recently her blood pressure at home has been ranging from 741-287 systolically and 86-76 diastolically.  She had called the office stating that she was having pain running through the area where she had a stent.  This was relieved with "water and vinegar which she drinks for gas.   When I saw her in May 2019, her blood pressure continued to be elevated in the office despite taking amlodipine 2.5 mg (she stated she could not tolerate 5 mg), metoprolol 100 mg twice a day, and she was taking valsartan 160 in the morning and only three quarters of a tablet in the evening.  With her blood pressure elevation I suggested further titration of valsartan to 160 twice daily and added Cardura 1 mg initially at night with plans to titrate to 2 mg.  I recommended follow-up hypertensive clinic evaluations with Ruth [name] as well as Ruth [name] who she has seen on numerous occasions.  He continues to admit to intolerance to numerous medications.  I saw her in February 2020 at which time she was feeling significantly better.   She continued to have blood pressure elevation in the office and told me typically blood pressure at home was ranging around 720 systolically.  [name] was admitted to the hospital with recurrent abdominal pain which is her anginal equivalent.  I recommended repeat definitive cardiac catheterization since prior to her last intervention she experienced the same symptoms which improved  following intervention to her vein graft to RCA.  Diagnostic catheterization was done on June 22, 2019 by Dr. Tamala Julian and she again had developed restenosis within the stent in the vein graft to her RCA.  She also had new stenosis of 90% in the vein graft supplying her circumflex marginal vessel.  Her LIMA to LAD and diagonal remain patent.  She had an occluded native mid RCA and circumflex and high-grade 70% proximal LAD with 90% mid LAD stenosis with an occluded second diagonal.  After much discussion we ultimately recommended PCI and the following day she underwent successful intervention with stenting to the vein graft supplying her circumflex marginal vessel and PTCA for the in-stent restenosis in the vein graft supplying her RCA.  Subsequently, her symptoms have improved.  Of note, during her hospitalization on presentation she had marked hyperlipidemia highly suggestive of familial hyperlipidemia with total cholesterol at 300, LDL cholesterol 224 and triglycerides 183.  In the past she has not been  able to tolerate statins or Zetia.  I recommended initiation of PCSK9 therapy.   She had called the office  with complaints of elevated blood pressure.  Amlodipine was increased to 7.5 mg daily.  She was to continue to take and record blood pressure readings daily.  She again called the office on August 18, 2019 with complaints of a sharp epigastric discomfort that she felt was gas but Gas-X only partially improved her symptoms.  She was w evaluated by Ruth Domino, NP  in the office on August 21, 2019 for follow-up evaluation.  She was having some frequent burping and gas symptoms.  Her blood pressure was elevated at 158/92.  She had undergone prior abdominal ultrasound which was negative for cholelithiasis.  GI evaluation was recommended.  I  saw her in a telemedicine visit in February 2021.  At that time she felt improved.  Since initiating Praluent, lipid studies had improved from an LDL of 224   To 123 on September 29, 2019.  I mentioned to her that she was only on the 75 mg dose of Praluent and my recommendation would be to titrate this to the 150 mg dose.  She also continued to have blood pressure lability and was adamant about increasing medications.  I saw her in a telemedicine visit in February 2021.  Her blood pressure continued to be elevated and I recommended she increase amlodipine to 5 mg.  Her LDL cholesterol was improved but remained elevated at 123 and I again recommended increasing Praluent to 150 mg rather than the reduced dose of 75 mg every 2 weeks.  I last saw her in November 2021.  Over the prior year she had  issues with diverticulitis and irritable bowel syndrome.  She also has had urinary issues and underwent cystoscopy by Dr. Vikki [name].  She was in need for Praluent and we had obtained a sample of her next dose.  She denies any recurrent chest pain.  Since I last saw her, she was recently notified by Ruth [name] after the patient had called our answering service regarding blood pressure concerns.  She admitted that she gets very anxious and at times her blood pressure increases.  She was encouraged to keep a log of her blood pressure and bring it to her follow-up office visit.  Presently, she states her blood pressure in the morning typically runs around 140/70 but at night it can increase to as high as 220/105.  She states her blood pressure last night was 180/90.  She has numerous medication intolerances.  Currently she has been taking amlodipine 5 mg, hydralazine only on a as needed basis 10 mg, metoprolol tartrate 100 mg twice a day and valsartan 160 mg twice a day.  She was requesting another dose of Praluent to be provided.  I checked with our pharmacist and apparently she has been approved for continued therapy.  She continues to have a lot of anxiety.  She denies chest pain.  She presents for evaluation.   Past Medical History:  Diagnosis Date  . Anxiety   .  CAD (coronary artery disease)    a. CABG x 4 in 2006 in [place] (LIMA->LAD, VG->Diag, VG->OM, VG->RCA); b. DES to midbody of SVG-PDA/PLA 07/2012; c. 03/2015 Myoview: EF 65%, small defect of mild severit in basal inferolateral wall, low risk.  . Complication of anesthesia    "quit breathing when I got ?[place]" (07/28/2012)  . Depression   . Diverticulitis   . Diverticulosis   .  Dyslipidemia    Intolerant of statins  . Fecal incontinence   . GERD (gastroesophageal reflux disease)   . Hyperlipidemia   . Hyperplastic colon polyp   . IBS (irritable bowel syndrome)   . Labile hypertension    a. 09/2016 Renal Artery duplex: nl renal arteries.  . Migraines    "had them in my 30's" (07/28/2012)  . Multiple allergies   . Obesity   . Steatohepatitis   . Type II diabetes mellitus (HCC)    a. no meds, diet controlled - takes cinnamon.    Past Surgical History:  Procedure Laterality Date  . ABDOMINAL HYSTERECTOMY  1970's  . BREAST LUMPECTOMY     bilateral  . CARDIAC CATHETERIZATION  2006  . CORONARY ANGIOPLASTY WITH STENT PLACEMENT  07/28/2012   "1; first time for me" (07/28/2012)  . CORONARY ARTERY BYPASS GRAFT  2006   CABG X4  . CORONARY STENT INTERVENTION N/A 03/01/2017   Procedure: Coronary Stent Intervention;  Surgeon: Troy Sine, MD;  Location: [facility];  Service: Cardiovascular;  Laterality: N/A;  . CORONARY STENT INTERVENTION N/A 06/23/2019   Procedure: CORONARY STENT INTERVENTION;  Surgeon: Martinique, Peter M, MD;  Location: [facility];  Service: Cardiovascular;  Laterality: N/A;  . EXCISIONAL HEMORRHOIDECTOMY  1970's  . INGUINAL HERNIA REPAIR  1970's?   left  . LEFT HEART CATH AND CORS/GRAFTS ANGIOGRAPHY N/A 03/01/2017   Procedure: Left Heart Cath and Cors/Grafts Angiography;  Surgeon: Troy Sine, MD;  Location: [place];  Service: Cardiovascular;  Laterality: N/A;  . LEFT HEART CATH AND CORS/GRAFTS ANGIOGRAPHY N/A 06/22/2019   Procedure:  LEFT HEART CATH AND CORS/GRAFTS ANGIOGRAPHY;  Surgeon: Belva Crome, MD;  Location: [facility];  Service: Cardiovascular;  Laterality: N/A;  . LEFT HEART CATHETERIZATION WITH CORONARY/GRAFT ANGIOGRAM N/A 07/28/2012   Procedure: LEFT HEART CATHETERIZATION WITH Beatrix Fetters;  Surgeon: Burnell Blanks, MD;  Location: [facility];  Service: Cardiovascular;  Laterality: N/A;  . PERCUTANEOUS CORONARY STENT INTERVENTION (PCI-S)  07/28/2012   Procedure: PERCUTANEOUS CORONARY STENT INTERVENTION (PCI-S);  Surgeon: Burnell Blanks, MD;  Location: [facility];  Service: Cardiovascular;;  . TONSILLECTOMY AND ADENOIDECTOMY  ~ 1951    Allergies  Allergen Reactions  . Betadine [Povidone Iodine] Shortness Of Breath and Swelling  . Codeine Anaphylaxis and Shortness Of Breath    "quit breathing" (07/28/2012) Tolerates 1-2 shots of morphine  . Demerol Anaphylaxis    "quit breathing" (07/28/2012)  . Iohexol Anaphylaxis    Finger/ankle swelling  . Latex Anaphylaxis    "quit breathing" (07/28/2012)  . Other Anaphylaxis    Perfume, Any Fragrance. Cleaning Fluids.  "quit breathing" (07/28/2012)  . Percocet [Oxycodone-Acetaminophen] Anaphylaxis    "quit breathing; disoriented" (07/28/2012)  . Plavix [Clopidogrel Bisulfate] Anaphylaxis    "get hot; like I'm burning up inside; had to put me in shower after OHS because of that" (07/28/2012)  . Red Dye Anaphylaxis    "quit breathing" (07/28/2012)  . Shellfish Allergy Shortness Of Breath    "broke out in knots all over" (07/28/2012)  . Sulfonamide Derivatives Shortness Of Breath and Rash    "quit breathing" (07/28/2012)  . Tylenol [Acetaminophen] Shortness Of Breath and Itching  . Azilsartan     Other reaction(s): muscle pain  . Azithromycin     Other reaction(s): she cannot take it. felt like she would die. legs would not work , painful and had to get in the shower and had diarrhea.  Marland Kitchen  Barbiturates     Other reaction(s):  SOB  . Cardizem [Diltiazem]     Other reaction(s): took one dose.  felt very sick, bad. said raised bp more.  . Ciprofloxacin     Other reaction(s): after one does had charlie horses all over her legs and could not go back to sleep  . Coumadin [Warfarin]     Other reaction(s): CAN'T TAKE, GETS HOT/BREAKS OUT  . Crestor [Rosuvastatin]     Other reaction(s): RASH  . Ezetimibe     Other reaction(s): RASH  . Fluogen [Influenza Virus Vaccine]     Other reaction(s): gets very sick and thinks it is the mercury level.  . Furosemide     Other reaction(s): makes her too tired.  . Hydrochlorothiazide     Other reaction(s): broke out with blisters on her mouth  . Omega-3-Acid Ethyl Esters     Other reaction(s): bad diarrhea  . Pravastatin Sodium Other (See Comments)    Leg pain, problems ambulating   . Spironolactone     Other reaction(s): states she doesn't want to take it as it made her dehydrated in the past.  . Statins     Muscle pain  . Sulfa Antibiotics     Other reaction(s): swelling/SOB  . Vancomycin     Other reaction(s): pt states it caused her major abd pain  . Imdur [Isosorbide Nitrate] Other (See Comments)    Intolerance. "Can't remember the reaction."    Current Outpatient Medications  Medication Sig Dispense Refill  . amLODipine (NORVASC) 10 MG tablet Take 1 tablet (10 mg total) by mouth every morning. 90 tablet 3  . aspirin EC 81 MG tablet Take 81 mg by mouth daily.    . BD PEN NEEDLE NANO 2ND GEN 32G X 4 MM MISC USE THREE TIMES DAILY AS DIRECTED WITH HUMALOG AND TOUJEO    . calcium carbonate (TUMS - DOSED IN MG ELEMENTAL CALCIUM) 500 MG chewable tablet Chew 1-2 tablets by mouth 3 (three) times daily as needed for heartburn.     . cholecalciferol (VITAMIN D) 1000 UNITS tablet Take 1,000 Units by mouth daily.    Marland Kitchen CRANBERRY-VITAMIN C PO Take 1 tablet by mouth daily.    Marland Kitchen EPINEPHrine 0.3 mg/0.3 mL IJ SOAJ injection Inject 0.3 mg into the muscle as needed.    Marland Kitchen HUMALOG  KWIKPEN 100 UNIT/ML KwikPen Inject 5 Units into the skin in the morning and at bedtime.    . hydrALAZINE (APRESOLINE) 10 MG tablet Take 1-2 tablets every 8 hours as needed for systolic greater than 562 60 tablet 3  . Insulin Glargine (TOUJEO MAX SOLOSTAR [place]) Inject 50 Units into the skin daily.     . Lancets (ONETOUCH DELICA PLUS [id number]) MISC USE TO CHECK BLOOD SUGAR FOUR TIMES DAILY    . Magnesium 250 MG TABS Take 250 mg by mouth daily.    . metoprolol tartrate (LOPRESSOR) 100 MG tablet TAKE 1 TABLET(100 MG) BY MOUTH TWICE DAILY (Patient taking differently: Take 100 mg by mouth 2 (two) times daily.) 180 tablet 1  . nitrofurantoin, macrocrystal-monohydrate, (MACROBID) 100 MG capsule Take 100 mg by mouth daily.    . nitroGLYCERIN (NITROSTAT) 0.4 MG SL tablet Place 1 tablet (0.4 mg total) under the tongue every 5 (five) minutes as needed for chest pain (up to 3 doses). 25 tablet 2  . ONETOUCH VERIO test strip     . PRALUENT 75 MG/ML SOAJ ADMINISTER 1 ML UNDER THE SKIN EVERY 14 DAYS (Patient taking  differently: Inject 1 mL into the skin See admin instructions. Every 16 days) 2 mL 11  . prasugrel (EFFIENT) 10 MG TABS tablet Take 1 tablet (10 mg total) by mouth daily. 90 tablet 3  . Probiotic Product (PROBIOTIC-10 PO) Take 1 capsule by mouth daily.    . valsartan (DIOVAN) 160 MG tablet TAKE 1 TABLET(160 MG) BY MOUTH TWICE DAILY (Patient taking differently: Take 160 mg by mouth 2 (two) times daily.) 180 tablet 3  . vitamin B-12 (CYANOCOBALAMIN) 1000 MCG tablet Take 1,000 mcg by mouth daily.     No current facility-administered medications for this visit.    Social History   Socioeconomic History  . Marital status: Widowed    Spouse name: Not on file  . Number of children: 2  . Years of education: Not on file  . Highest education level: Not on file  Occupational History  . Occupation: retired  Tobacco Use  . Smoking status: Never Smoker  . Smokeless tobacco: Never Used  Vaping Use  .  Vaping Use: Never used  Substance and Sexual Activity  . Alcohol use: No    Alcohol/week: 0.0 standard drinks  . Drug use: No  . Sexual activity: Never  Other Topics Concern  . Not on file  Social History Narrative  . Not on file   Social Determinants of Health   Financial Resource Strain: Not on file  Food Insecurity: Not on file  Transportation Needs: Not on file  Physical Activity: Not on file  Stress: Not on file  Social Connections: Not on file  Intimate Partner Violence: Not on file   Social history is notable that she is widowed.  She has 2 children, ages 17 and 40.  There is no tobacco use.  She does not routinely exercise. She has a son who had undergone CABG surgery and reportedly has a defibrillator and an ejection fraction of 35%.  Family History  Problem Relation Age of Onset  . Coronary artery disease Father   . Colon cancer Father   . Colon polyps Father   . Heart disease Father   . Stroke Mother   . Hypertension Other   . Breast cancer Other        grandmother  . Diabetes Maternal Grandmother   . Diabetes Paternal Grandmother   . Esophageal cancer Neg Hx   . Rectal cancer Neg Hx   . Stomach cancer Neg Hx    Family history is notable in that her father has heart disease and is 53 years old.  Her mother died at age 40 with a stroke.  She has 2 sisters, ages 45 and 58.  ROS General: Negative; No fevers, chills, or night sweats HEENT: Negative; No changes in vision or hearing, sinus congestion, difficulty swallowing Pulmonary: Negative; No cough, wheezing, shortness of breath, hemoptysis Cardiovascular: See HPI:  GI: Positive for left upper quadrant pain which he describes as "gas."  She has issues with diverticulitis intermittently and irritable bowel syndrome GU: Has had some urinary issues and will be undergoing a cystoscopy Musculoskeletal: Negative; no myalgias, joint pain, or weakness Hematologic: Negative; no easy bruising, bleeding Endocrine:  Positive for diabetes mellitus , which is untreated.  She has refused medications Neuro: Negative; no changes in balance, headaches Skin: Negative; No rashes or skin lesions Psychiatric: Anxiety Sleep: Negative; No snoring,  daytime sleepiness, hypersomnolence, bruxism, restless legs, hypnogognic hallucinations. Other comprehensive 14 point system review is negative   Physical Exam BP (!) 146/80   Pulse  65   Ht 5' 1.5" (1.562 m)   Wt 187 lb 6.4 oz (85 kg)   BMI 34.84 kg/m    Repeat blood pressure by me was 178/80  Wt Readings from Last 3 Encounters:  01/29/21 187 lb 6.4 oz (85 kg)  12/24/20 184 lb (83.5 kg)  12/16/20 189 lb (85.7 kg)   General: Alert, oriented, no distress.  Skin: normal turgor, no rashes, warm and dry HEENT: Normocephalic, atraumatic. Pupils equal round and reactive to light; sclera anicteric; extraocular muscles intact; Nose without nasal septal hypertrophy Mouth/Parynx benign; Mallinpatti scale 3 Neck: No JVD, no carotid bruits; normal carotid upstroke Lungs: clear to ausculatation and percussion; no wheezing or rales Chest wall: without tenderness to palpitation Heart: PMI not displaced, RRR, s1 s2 normal, 1/6 systolic murmur, no diastolic murmur, no rubs, gallops, thrills, or heaves Abdomen: soft, nontender; no hepatosplenomehaly, BS+; abdominal aorta nontender and not dilated by palpation. Back: no CVA tenderness Pulses 2+ Musculoskeletal: full range of motion, normal strength, no joint deformities Extremities: no clubbing cyanosis or edema, Homan's sign negative  Neurologic: grossly nonfocal; Cranial nerves grossly wnl Psychologic: Normal mood and affect   ECG (independently read by me): NSR at 65; nonspecific ST changes  November 17, 2021ECG (independently read by me): Normal sinus rhythm at 63 bpm.  Normal intervals.  Mild T wave abnormality in aVL  October 2020 ECG (independently read by me): Normal sinus rhythm 64 bpm.  Nonspecific ST changes.    February 2020 ECG (independently read by me): NSR ay 68; nonspecific lateral T wave abnormality.  Normal intervals.  No ectopy.  May 2019 ECG (independently read by me): Normal sinus rhythm at 66 bpm.  Lateral T wave abnormality in leads I and aVL.  Normal intervals.  October 2018 ECG (independently read by me): Normal sinus rhythm at 66 bpm.  Nondiagnostic T change laterally.  Normal intervals.  No ectopy.  April 2018 ECG (independently read by me): Normal sinus rhythm at 67 bpm.  PR interval 142 ms.  Poor anterior R-wave progression.  Lateral T changes in 1 and L.  December 2017 ECG (independently read by me): Normal sinus rhythm at 70 bpm.  Nonspecific ST changes.  No ectopy.  March 2017 ECG (independently read by me):  Normal sinus rhythm at 70 bpm. Nonspecific ST changes laterally.  03/04/2015 ECG (independently read by me): Normal sinus rhythm at 65 bpm.  Mild T wave abnormality in lead 1 and aVL.  LABS:  BMP Latest Ref Rng & Units 12/26/2020 12/25/2020 12/24/2020  Glucose 70 - 99 mg/dL 157(H) 178(H) 197(H)  BUN 8 - 23 mg/dL 20 14 11   Creatinine 0.44 - 1.00 mg/dL 0.81 0.83 0.66  BUN/Creat Ratio 12 - 28 - - -  Sodium 135 - 145 mmol/L 136 138 138  Potassium 3.5 - 5.1 mmol/L 3.8 4.6 3.8  Chloride 98 - 111 mmol/L 102 103 102  CO2 22 - 32 mmol/L 26 25 24   Calcium 8.9 - 10.3 mg/dL 9.4 9.3 9.3     Hepatic Function Latest Ref Rng & Units 12/26/2020 12/24/2020 08/21/2019  Total Protein 6.5 - 8.1 g/dL 6.0(L) 6.8 7.1  Albumin 3.5 - 5.0 g/dL 3.3(L) 3.7 4.5  AST 15 - 41 U/L 16 18 16   ALT 0 - 44 U/L 14 13 12   Alk Phosphatase 38 - 126 U/L 58 60 75  Total Bilirubin 0.3 - 1.2 mg/dL 0.8 0.8 0.6  Bilirubin, Direct 0.0 - 0.2 mg/dL - - -    CBC  Latest Ref Rng & Units 12/25/2020 12/24/2020 06/24/2019  WBC 4.0 - 10.5 K/uL 8.2 6.2 11.5(H)  Hemoglobin 12.0 - 15.0 g/dL 14.6 13.0 13.4  Hematocrit 36.0 - 46.0 % 44.2 39.9 40.7  Platelets 150 - 400 K/uL 265 201 263   Lab Results  Component Value  Date   MCV 91.5 12/25/2020   MCV 92.6 12/24/2020   MCV 92.5 06/24/2019    Lab Results  Component Value Date   TSH 1.987 03/19/2015    BNP No results found for: BNP  ProBNP No results found for: PROBNP   Lipid Panel     Component Value Date/Time   CHOL 300 (H) 06/21/2019 1855   TRIG 183 (H) 06/21/2019 1855   HDL 39 (L) 06/21/2019 1855   CHOLHDL 7.7 06/21/2019 1855   VLDL 37 06/21/2019 1855   LDLCALC 224 (H) 06/21/2019 1855   LDLDIRECT 160.9 06/21/2012 0833     RADIOLOGY: No results found.  IMPRESSION:  1. Coronary artery disease involving native coronary artery of native heart without angina pectoris   2. Hyperlipidemia with target LDL less than 70   3. Essential hypertension   4. Statin intolerance   5. Diverticular disease   6. Medication management     ASSESSMENT AND PLAN: Ms. Terriah Reggio is a 75 year old female who is 14 years status post CABG revascularization surgery 4  at the [facility] in [place], [place] in 2006.  She is status post remote DES stenting to the vein graft supplying her RCA territory.  She developed recurrent anginal symptomatology leading to repeat cardiac catheterization on 03/01/2017. 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  Presently, she remains angina free.  She has had some blood pressure lability.  I have recommended further titration of amlodipine to 10 mg daily up from 5 mg and to take this in the morning and not wait until lunch to take this medication.  She has been staggering all her medicines almost every 2 hours throughout the day.  She continues to be on metoprolol 100 mg twice a day and has a prescription for as needed hydralazine.  She is also on valsartan 160 mg twice a day.  I reviewed recent lipid studies.  I strongly  recommended that she increase her dose of Praluent from 75 mg up to the 150 mg dose.  She does not want to do this.  Her most recent LDL cholesterol was 108.  We did give her a sample of Praluent today and reassured her that she is improved but hopefully she will consent to the increased dose for more effective LDL lowering.  She is to undergo future procedures, she will need to hold prasugrel for 7 days.  I will see her in 6 months for follow-up evaluation   Troy Sine, MD, [facility]  02/04/2021 2:35 PM

## 2021-02-04 ENCOUNTER — Encounter: Payer: Self-pay | Admitting: Cardiovascular Disease

## 2021-05-01 ENCOUNTER — Encounter: Payer: Self-pay | Admitting: Podiatry

## 2021-05-01 ENCOUNTER — Ambulatory Visit: Payer: Medicare Other

## 2021-05-01 ENCOUNTER — Other Ambulatory Visit: Payer: Self-pay

## 2021-05-01 ENCOUNTER — Ambulatory Visit: Payer: Medicare Other | Admitting: Podiatry

## 2021-05-01 DIAGNOSIS — S90851A Superficial foreign body, right foot, initial encounter: Secondary | ICD-10-CM | POA: Diagnosis not present

## 2021-05-01 DIAGNOSIS — M795 Residual foreign body in soft tissue: Secondary | ICD-10-CM

## 2021-05-01 NOTE — Progress Notes (Signed)
Subjective:  Patient ID: Ruth [name], female    DOB: [birth year],  MRN: [id number] HPI Chief Complaint  Patient presents with   Foot Pain    Plantar heel right - callused area x 4 weeks, gotten dark, very tender when walking, soaking in epsom   New Patient (Initial Visit)   Diabetes    Last a1c was 7.2    75 y.o. female presents with the above complaint.   ROS: Denies fever chills nausea vomiting muscle aches pains calf pain back pain chest pain shortness of breath.  Past Medical History:  Diagnosis Date   Anxiety    CAD (coronary artery disease)    a. CABG x 4 in 2006 in [place] (LIMA->LAD, VG->Diag, VG->OM, VG->RCA); b. DES to midbody of SVG-PDA/PLA 07/2012; c. 03/2015 Myoview: EF 65%, small defect of mild severit in basal inferolateral wall, low risk.   Complication of anesthesia    "quit breathing when I got ?[place]" (07/28/2012)   Depression    Diverticulitis    Diverticulosis    Dyslipidemia    Intolerant of statins   Fecal incontinence    GERD (gastroesophageal reflux disease)    Hyperlipidemia    Hyperplastic colon polyp    IBS (irritable bowel syndrome)    Labile hypertension    a. 09/2016 Renal Artery duplex: nl renal arteries.   Migraines    "had them in my 20's" (07/28/2012)   Multiple allergies    Obesity    Steatohepatitis    Type II diabetes mellitus ([place])    a. no meds, diet controlled - takes cinnamon.   Past Surgical History:  Procedure Laterality Date   ABDOMINAL HYSTERECTOMY  1970's   BREAST LUMPECTOMY     bilateral   CARDIAC CATHETERIZATION  2006   CORONARY ANGIOPLASTY WITH STENT PLACEMENT  07/28/2012   "1; first time for me" (07/28/2012)   CORONARY ARTERY BYPASS GRAFT  2006   CABG X4   CORONARY STENT INTERVENTION N/A 03/01/2017   Procedure: Coronary Stent Intervention;  Surgeon: Troy Sine, MD;  Location: [place];  Service: Cardiovascular;  Laterality: N/A;   CORONARY STENT INTERVENTION N/A 06/23/2019   Procedure:  CORONARY STENT INTERVENTION;  Surgeon: Martinique, Peter M, MD;  Location: [facility];  Service: Cardiovascular;  Laterality: N/A;   EXCISIONAL HEMORRHOIDECTOMY  1970's   INGUINAL HERNIA REPAIR  1970's?   left   LEFT HEART CATH AND CORS/GRAFTS ANGIOGRAPHY N/A 03/01/2017   Procedure: Left Heart Cath and Cors/Grafts Angiography;  Surgeon: Troy Sine, MD;  Location: [facility];  Service: Cardiovascular;  Laterality: N/A;   LEFT HEART CATH AND CORS/GRAFTS ANGIOGRAPHY N/A 06/22/2019   Procedure: LEFT HEART CATH AND CORS/GRAFTS ANGIOGRAPHY;  Surgeon: Belva Crome, MD;  Location: [place] [facility];  Service: Cardiovascular;  Laterality: N/A;   LEFT HEART CATHETERIZATION WITH CORONARY/GRAFT ANGIOGRAM N/A 07/28/2012   Procedure: LEFT HEART CATHETERIZATION WITH Beatrix Fetters;  Surgeon: Burnell Blanks, MD;  Location: [facility];  Service: Cardiovascular;  Laterality: N/A;   PERCUTANEOUS CORONARY STENT INTERVENTION (PCI-S)  07/28/2012   Procedure: PERCUTANEOUS CORONARY STENT INTERVENTION (PCI-S);  Surgeon: Burnell Blanks, MD;  Location: [facility] CATH LAB;  Service: Cardiovascular;;   TONSILLECTOMY AND ADENOIDECTOMY  ~ 1951    Current Outpatient Medications:    amLODipine (NORVASC) 2.5 MG tablet, SMARTSIG:1 Tablet(s) By Mouth Every Evening, Disp: , Rfl:    aspirin EC 81 MG tablet, Take 81 mg by mouth daily., Disp: ,  Rfl:    BD PEN NEEDLE NANO 2ND GEN 32G X 4 MM MISC, USE THREE TIMES DAILY AS DIRECTED WITH HUMALOG AND TOUJEO, Disp: , Rfl:    calcium carbonate (TUMS - DOSED IN MG ELEMENTAL CALCIUM) 500 MG chewable tablet, Chew 1-2 tablets by mouth 3 (three) times daily as needed for heartburn. , Disp: , Rfl:    cholecalciferol (VITAMIN D) 1000 UNITS tablet, Take 1,000 Units by mouth daily., Disp: , Rfl:    CRANBERRY-VITAMIN C PO, Take 1 tablet by mouth daily., Disp: , Rfl:    doxycycline (VIBRA-TABS) 100 MG tablet, Take 100 mg by mouth 2 (two) times daily., Disp: ,  Rfl:    EPINEPHrine 0.3 mg/0.3 mL IJ SOAJ injection, Inject 0.3 mg into the muscle as needed., Disp: , Rfl:    HUMALOG KWIKPEN 100 UNIT/ML KwikPen, Inject 5 Units into the skin in the morning and at bedtime., Disp: , Rfl:    hydrALAZINE (APRESOLINE) 25 MG tablet, 1 tablet with food, Disp: , Rfl:    Insulin Glargine (TOUJEO Ruth Seavey SOLOSTAR [name]), Inject 50 Units into the skin daily. , Disp: , Rfl:    irbesartan (AVAPRO) 150 MG tablet, Take by mouth., Disp: , Rfl:    Lancets (ONETOUCH DELICA PLUS [id number]) MISC, USE TO CHECK BLOOD SUGAR FOUR TIMES DAILY, Disp: , Rfl:    Magnesium 250 MG TABS, Take 250 mg by mouth daily., Disp: , Rfl:    metoprolol tartrate (LOPRESSOR) 100 MG tablet, TAKE 1 TABLET(100 MG) BY MOUTH TWICE DAILY (Patient taking differently: Take 100 mg by mouth 2 (two) times daily.), Disp: 180 tablet, Rfl: 1   nitrofurantoin, macrocrystal-monohydrate, (MACROBID) 100 MG capsule, Take 100 mg by mouth daily., Disp: , Rfl:    nitroGLYCERIN (NITROSTAT) 0.4 MG SL tablet, Place 1 tablet (0.4 mg total) under the tongue every 5 (five) minutes as needed for chest pain (up to 3 doses)., Disp: 25 tablet, Rfl: 2   NOVOLOG 100 UNIT/ML injection, Inject into the skin., Disp: , Rfl:    ONETOUCH VERIO test strip, , Disp: , Rfl:    PRALUENT 75 MG/ML SOAJ, ADMINISTER 1 ML UNDER THE SKIN EVERY 14 DAYS (Patient taking differently: Inject 1 mL into the skin See admin instructions. Every 16 days), Disp: 2 mL, Rfl: 11   prasugrel (EFFIENT) 10 MG TABS tablet, Take 1 tablet (10 mg total) by mouth daily., Disp: 90 tablet, Rfl: 3   Probiotic Product (PROBIOTIC-10 PO), Take 1 capsule by mouth daily., Disp: , Rfl:    valsartan (DIOVAN) 160 MG tablet, TAKE 1 TABLET(160 MG) BY MOUTH TWICE DAILY (Patient taking differently: Take 160 mg by mouth 2 (two) times daily.), Disp: 180 tablet, Rfl: 3   vitamin B-12 (CYANOCOBALAMIN) 1000 MCG tablet, Take 1,000 mcg by mouth daily., Disp: , Rfl:   Allergies  Allergen Reactions    Betadine [Povidone Iodine] Shortness Of Breath and Swelling   Codeine Anaphylaxis and Shortness Of Breath    "quit breathing" (07/28/2012) Tolerates 1-2 shots of morphine   Demerol Anaphylaxis    "quit breathing" (07/28/2012)   Iohexol Anaphylaxis    Finger/ankle swelling   Latex Anaphylaxis    "quit breathing" (07/28/2012)   Other Anaphylaxis    Perfume, Any Fragrance. Cleaning Fluids.  "quit breathing" (07/28/2012)   Percocet [Oxycodone-Acetaminophen] Anaphylaxis    "quit breathing; disoriented" (07/28/2012)   Plavix [Clopidogrel Bisulfate] Anaphylaxis    "get hot; like I'm burning up inside; had to put me in shower after OHS because of that" (07/28/2012)  Red Dye Anaphylaxis    "quit breathing" (07/28/2012)   Shellfish Allergy Shortness Of Breath    "broke out in knots all over" (07/28/2012)   Sulfonamide Derivatives Shortness Of Breath and Rash    "quit breathing" (07/28/2012)   Tylenol [Acetaminophen] Shortness Of Breath and Itching   Azilsartan     Other reaction(s): muscle pain   Azithromycin     Other reaction(s): she cannot take it. felt like she would die. legs would not work , painful and had to get in the shower and had diarrhea.   Barbiturates     Other reaction(s): SOB   Cardizem [Diltiazem]     Other reaction(s): took one dose.  felt very sick, bad. said raised bp more.   Ciprofloxacin     Other reaction(s): after one does had charlie horses all over her legs and could not go back to sleep   Coumadin [Warfarin]     Other reaction(s): CAN'T TAKE, GETS HOT/BREAKS OUT   Crestor [Rosuvastatin]     Other reaction(s): RASH   Ezetimibe     Other reaction(s): RASH   Fluogen [Influenza Virus Vaccine]     Other reaction(s): gets very sick and thinks it is the mercury level.   Furosemide     Other reaction(s): makes her too tired.   Hydrochlorothiazide     Other reaction(s): broke out with blisters on her mouth   Omega-3-Acid Ethyl Esters     Other reaction(s): bad  diarrhea   Pravastatin Sodium Other (See Comments)    Leg pain, problems ambulating    Spironolactone     Other reaction(s): states she doesn't want to take it as it made her dehydrated in the past.   Statins     Muscle pain   Sulfa Antibiotics     Other reaction(s): swelling/SOB   Vancomycin     Other reaction(s): pt states it caused her major abd pain   Imdur [Isosorbide Nitrate] Other (See Comments)    Intolerance. "Can't remember the reaction."   Review of Systems Objective:  There were no vitals filed for this visit.  General: Well developed, nourished, in no acute distress, alert and oriented x3   Dermatological: Skin is warm, dry and supple bilateral. Nails x 10 are well maintained; remaining integument appears unremarkable at this time. There are no open sores, no preulcerative lesions, no rash or signs of infection present.  She had a small discolored lesion on the plantar lateral aspect of her right heel.  She states that it is very painful.  I debrided the scab that was over the lesion and immediately saw a small piece of wire measuring about 4 mm in total length it looked as if it was from a wire brush.  There was no erythema no cellulitis no purulence no malodor associated with it.  It was removed the area was flushed with alcohol and a Band-Aid was placed.  Vascular: Dorsalis Pedis artery and Posterior Tibial artery pedal pulses are 2/4 bilateral with immedate capillary fill time. Pedal hair growth present. No varicosities and no lower extremity edema present bilateral.   Neruologic: Grossly intact via light touch bilateral. Vibratory intact via tuning fork bilateral. Protective threshold with Semmes Wienstein monofilament intact to all pedal sites bilateral. Patellar and Achilles deep tendon reflexes 2+ bilateral. No Babinski or clonus noted bilateral.   Musculoskeletal: No gross boney pedal deformities bilateral. No pain, crepitus, or limitation noted with foot and ankle  range of motion bilateral. Muscular strength 5/5  in all groups tested bilateral.  Gait: Unassisted, Nonantalgic.    Radiographs:  Radiographs not taken today.  Assessment & Plan:   Assessment: Foreign body piece of wire plantar right heel  Plan: Removal foreign body right.  Tolerated procedure well.  Follow-up with her in the near future if necessary.  She will watch and make sure that it does not get infected.     Ruth [name], [place]

## 2021-05-20 NOTE — Telephone Encounter (Signed)
error 

## 2021-06-13 ENCOUNTER — Telehealth: Payer: Self-pay | Admitting: Cardiovascular Disease

## 2021-06-13 NOTE — Telephone Encounter (Signed)
Called pt back to offer her a sooner appointment with a PA per her request. She declined the apportionment. Her current appointment was added to the wait list. She thanked me for calling her back and trying to get her soon sooner. She was encouraged again to go to the emergency department for her current and any worsening symptoms. She verbalized understanding.

## 2021-06-13 NOTE — Telephone Encounter (Signed)
Spoke with pt c/o neck pain and increased blood pressure. Recommenced to go to the Emergency Department. She objected to that suggestion. She states "I was not treated good last time I went." I suggested she go to another Emergency Department because her blood pressure is very elevated. She states " I am scared I'll have a stroke; my mom died from that." Pt wants her appointment with Dr. Claiborne Billings moved up. There is no available openings at this time. Will try to get pt seen sooner. **Her appointments need to be in the morning**

## 2021-06-13 NOTE — Telephone Encounter (Signed)
   Pt c/o BP issue: STAT if pt c/o blurred vision, one-sided weakness or slurred speech  1. What are your last 5 BP readings? 230/93  2. Are you having any other symptoms (ex. Dizziness, headache, blurred vision, passed out)? Blocked out  3. What is your BP issue? Pt blocked out for a second and checked her BP it was 230/93 she went and saw her pcp and was advised to f/u with Dr. Claiborne Billings as soon as possible, she asking if she can get an appt next week except monday

## 2021-06-16 ENCOUNTER — Other Ambulatory Visit: Payer: Self-pay | Admitting: Adult Health

## 2021-06-16 DIAGNOSIS — M542 Cervicalgia: Secondary | ICD-10-CM

## 2021-06-16 DIAGNOSIS — M47812 Spondylosis without myelopathy or radiculopathy, cervical region: Secondary | ICD-10-CM

## 2021-06-20 ENCOUNTER — Ambulatory Visit
Admission: RE | Admit: 2021-06-20 | Discharge: 2021-06-20 | Disposition: A | Discharge status: Home / Self Care | Payer: Medicare Other | Source: Ambulatory Visit | Attending: Adult Health | Admitting: Adult Health

## 2021-06-20 ENCOUNTER — Other Ambulatory Visit: Payer: Self-pay

## 2021-06-20 DIAGNOSIS — M542 Cervicalgia: Secondary | ICD-10-CM

## 2021-06-20 DIAGNOSIS — M47812 Spondylosis without myelopathy or radiculopathy, cervical region: Secondary | ICD-10-CM

## 2021-07-07 ENCOUNTER — Ambulatory Visit: Payer: Medicare Other | Attending: Neurological Surgery | Admitting: Physical Therapy

## 2021-07-07 ENCOUNTER — Other Ambulatory Visit: Payer: Self-pay

## 2021-07-07 ENCOUNTER — Encounter: Payer: Self-pay | Admitting: Physical Therapy

## 2021-07-07 VITALS — BP 176/97

## 2021-07-07 DIAGNOSIS — M542 Cervicalgia: Secondary | ICD-10-CM | POA: Diagnosis present

## 2021-07-07 DIAGNOSIS — M5412 Radiculopathy, cervical region: Secondary | ICD-10-CM | POA: Diagnosis present

## 2021-07-07 DIAGNOSIS — R252 Cramp and spasm: Secondary | ICD-10-CM | POA: Diagnosis present

## 2021-07-07 NOTE — Therapy (Signed)
[address] [place], [place], [address] Phone: [phone]   Fax:  [phone]  Physical Therapy Evaluation  Patient Details  Name: Ruth [name] MRN: [id number] Date of Birth: [birth year] Referring Provider (PT): Perini   Encounter Date: 07/07/2021   PT End of Session - 07/07/21 0913     Visit Number 1    Date for PT Re-Evaluation 10/07/21    Authorization Type UHC Medicare    PT Start Time 0829    PT Stop Time 0926    PT Time Calculation (min) 57 min    Activity Tolerance Patient tolerated treatment well;Patient limited by pain    Behavior During Therapy [facility] for tasks assessed/performed             Past Medical History:  Diagnosis Date   Anxiety    CAD (coronary artery disease)    a. CABG x 4 in 2006 in [place] (LIMA->LAD, VG->Diag, VG->OM, VG->RCA); b. DES to midbody of SVG-PDA/PLA 07/2012; c. 03/2015 Myoview: EF 65%, small defect of mild severit in basal inferolateral wall, low risk.   Complication of anesthesia    "quit breathing when I got ?Inovar" (07/28/2012)   Depression    Diverticulitis    Diverticulosis    Dyslipidemia    Intolerant of statins   Fecal incontinence    GERD (gastroesophageal reflux disease)    Hyperlipidemia    Hyperplastic colon polyp    IBS (irritable bowel syndrome)    Labile hypertension    a. 09/2016 Renal Artery duplex: nl renal arteries.   Migraines    "had them in my 19's" (07/28/2012)   Multiple allergies    Obesity    Steatohepatitis    Type II diabetes mellitus ([place])    a. no meds, diet controlled - takes cinnamon.    Past Surgical History:  Procedure Laterality Date   ABDOMINAL HYSTERECTOMY  1970's   BREAST LUMPECTOMY     bilateral   CARDIAC CATHETERIZATION  2006   CORONARY ANGIOPLASTY WITH STENT PLACEMENT  07/28/2012   "1; first time for me" (07/28/2012)   CORONARY ARTERY BYPASS GRAFT  2006   CABG X4   CORONARY STENT INTERVENTION N/A  03/01/2017   Procedure: Coronary Stent Intervention;  Surgeon: Troy Sine, MD;  Location: [place] [facility];  Service: Cardiovascular;  Laterality: N/A;   CORONARY STENT INTERVENTION N/A 06/23/2019   Procedure: CORONARY STENT INTERVENTION;  Surgeon: Martinique, Peter M, MD;  Location: [place] [facility];  Service: Cardiovascular;  Laterality: N/A;   EXCISIONAL HEMORRHOIDECTOMY  1970's   INGUINAL HERNIA REPAIR  1970's?   left   LEFT HEART CATH AND CORS/GRAFTS ANGIOGRAPHY N/A 03/01/2017   Procedure: Left Heart Cath and Cors/Grafts Angiography;  Surgeon: Troy Sine, MD;  Location: [place] [facility];  Service: Cardiovascular;  Laterality: N/A;   LEFT HEART CATH AND CORS/GRAFTS ANGIOGRAPHY N/A 06/22/2019   Procedure: LEFT HEART CATH AND CORS/GRAFTS ANGIOGRAPHY;  Surgeon: Belva Crome, MD;  Location: [place] [facility];  Service: Cardiovascular;  Laterality: N/A;   LEFT HEART CATHETERIZATION WITH CORONARY/GRAFT ANGIOGRAM N/A 07/28/2012   Procedure: LEFT HEART CATHETERIZATION WITH Beatrix Fetters;  Surgeon: Burnell Blanks, MD;  Location: [facility] CATH LAB;  Service: Cardiovascular;  Laterality: N/A;   PERCUTANEOUS CORONARY STENT INTERVENTION (PCI-S)  07/28/2012   Procedure: PERCUTANEOUS CORONARY STENT INTERVENTION (PCI-S);  Surgeon: Burnell Blanks, MD;  Location: [facility] CATH LAB;  Service: Cardiovascular;;   TONSILLECTOMY AND ADENOIDECTOMY  ~  1951    Vitals:   07/07/21 0834  BP: (!) 176/97      Subjective Assessment - 07/07/21 0834     Subjective Reports that she started having right sided neck pain about a month ago.  She is unsure of a cause.  MRI showed DDD/OA of the cervical spine    Pertinent History CABG x 4    Patient Stated Goals have less pain, feel better    Currently in Pain? Yes    Pain Score 4     Pain Location Neck    Pain Orientation Right    Pain Descriptors / Indicators Aching;Constant    Pain Type Acute pain    Pain Radiating Towards right  occipital area, pain upper trap    Pain Onset More than a month ago    Pain Frequency Constant    Aggravating Factors  turning head, looking up, driving, pain an 6/04    Pain Relieving Factors rest, heat at best a 4/10    Effect of Pain on Daily Activities difficulty driving                [facility] PT Assessment - 07/07/21 0001       Assessment   Medical Diagnosis neck pain    Referring Provider (PT) Perini    Onset Date/Surgical Date 06/06/21    Hand Dominance Right    Prior Therapy no      Precautions   Precautions None      Balance Screen   Has the patient fallen in the past 6 months No    Has the patient had a decrease in activity level because of a fear of falling?  No    Is the patient reluctant to leave their home because of a fear of falling?  No      Home Environment   Additional Comments does the housework, some yardwork      Prior Function   Level of Independence Independent    Vocation Retired    Leisure no exercise      Mining engineer Comments fwd head, rounded shoulders      ROM / Strength   AROM / PROM / Strength AROM;Strength      AROM   Overall AROM Comments cervical ROM decreaed 50% with pain mostly on the right, shoulder ROM WFL's      Strength   Overall Strength Comments Shoulders 4+/5      Palpation   Palpation comment Significant spasms and tenderness in the right upper trap and the right cervical area up to the occiput and the mastoid                        Objective measurements completed on examination: See above findings.       [place] [facility] Adult PT Treatment/Exercise - 07/07/21 0001       Modalities   Modalities Electrical Stimulation;Moist Heat      Moist Heat Therapy   Number Minutes Moist Heat 10 Minutes    Moist Heat Location Cervical      Electrical Stimulation   Electrical Stimulation Location right upper trap andneck area    Electrical Stimulation Action IFC    Electrical Stimulation  Parameters sitting    Electrical Stimulation Goals Pain                     PT Education - 07/07/21 0913     Education Details HEP  that included shrugs and retractions    Person(s) Educated Patient    Methods Explanation;Demonstration;Verbal cues;Handout    Comprehension Verbalized understanding              PT Short Term Goals - 07/07/21 0928       PT SHORT TERM GOAL #1   Title pt will be I with inital HEP    Time 3    Period Weeks    Status New               PT Long Term Goals - 07/07/21 3810       PT LONG TERM GOAL #1   Title pt will be I with all advanced HEP    Time 12    Period Weeks    Status New      PT LONG TERM GOAL #2   Title decrease pain overall 50%    Time 12    Period Weeks    Status New      PT LONG TERM GOAL #3   Title report 50% less pain with driving    Time 12    Period Weeks    Status New      PT LONG TERM GOAL #4   Title increase cervical ROM 25%    Time 12    Period Weeks    Status New      PT LONG TERM GOAL #5   Title understand posture and body mechanics    Time 12    Period Weeks    Status New                    Plan - 07/07/21 1751     Clinical Impression Statement Patient reports that she has had significant right side neck pain for about a month, unsure of a cause, MRI showed DDD and OA some stenosis.  She has significant spasms and trigger points in the right upper trap, right cervical area, tender in the right occiput and mastoid.  She reports that she cannot lie down due to heart issues, she had high BP today, reports an issue with one of the medications, I asked that she contact her MD, she also reports it is high due to white coat syndrome and the pain, she has decresed cervical ROM    Stability/Clinical Decision Making Stable/Uncomplicated    Clinical Decision Making Low    Rehab Potential Good    PT Frequency 2x / week    PT Duration 12 weeks    PT Treatment/Interventions ADLs/Self  Care Home Management;Electrical Stimulation;Moist Heat;Traction;Ultrasound;Therapeutic activities;Therapeutic exercise;Neuromuscular re-education;Manual techniques;Patient/family education;Dry needling    PT Next Visit Plan gentle exercises , could try DN, vs massage, she reports that she cannot use lotions or oils    Consulted and Agree with Plan of Care Patient             Patient will benefit from skilled therapeutic intervention in order to improve the following deficits and impairments:  Decreased range of motion, Increased muscle spasms, Pain, Improper body mechanics, Decreased strength, Decreased mobility, Postural dysfunction  Visit Diagnosis: Cervicalgia - Plan: PT plan of care cert/re-cert  Cramp and spasm - Plan: PT plan of care cert/re-cert  Radiculopathy, cervical region - Plan: PT plan of care cert/re-cert     Problem List Patient Active Problem List   Diagnosis Date Noted   Dizziness 12/24/2020   Multiple drug allergies 12/24/2020   Dysuria 01/17/2020   Non-ST elevation (NSTEMI) myocardial  infarction [facility])    Chest pain 06/21/2019   IBS (irritable bowel syndrome) 06/21/2019   Hyperbilirubinemia 06/21/2019   GERD (gastroesophageal reflux disease) 06/21/2019   Low back pain 05/11/2019   Atopic dermatitis 04/03/2019   Other specified health status 08/10/2018   Encounter for general adult medical examination without abnormal findings 06/15/2018   Noninflammatory disorder of vagina 06/13/2018   Arthralgia of right temporomandibular joint 10/28/2017   Bilateral impacted cerumen 10/28/2017   Chronic otitis externa of left ear 10/28/2017   Frequency of micturition 09/28/2017   Benign neoplasm of colon 08/24/2017   Spinal stenosis of lumbar region 08/24/2017   Fall 04/27/2017   Abnormal nuclear stress test    Other stressful life events affecting family and household 01/26/2017   Diverticula of intestine 09/28/2016   Eructation 09/28/2016   Tinea unguium  06/24/2016   Neck pain 03/12/2016   Hypertensive heart disease 05/25/2015   Hyperglycemia due to type 2 diabetes mellitus ([place]) 03/22/2015   Pure hypercholesterolemia 03/22/2015   Type II or unspecified type diabetes mellitus without mention of complication, uncontrolled 02/14/2014   Hyponatremia 02/13/2014   HTN (hypertension) 02/13/2014   Sepsis secondary to UTI ([place]) 02/12/2014   Acute pyelonephritis 02/12/2014   Pain in left leg 11/13/2013   Candidiasis 09/12/2013   Impingement syndrome of left shoulder region 02/14/2013   Spasm 12/14/2012   Unstable angina (HCC) 07/29/2012   Obesity (BMI 30-39.9) 07/29/2012   Chronic ulcer of skin ([place]) 06/16/2012   Hemorrhoids 06/16/2012   Bloating 06/15/2012   Generalized abdominal pain 06/15/2012   Family history of colon cancer 06/15/2012   Breast lump 01/11/2012   Microscopic hematuria 01/11/2012   Myalgia 11/24/2011   Malignant HTN with heart disease, w/o CHF, 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 [place], [place], [address] Phone: [phone]   Fax:  [phone]  Name: Ruth [name] MRN: [id number] Date of Birth: Oct 30, 1945

## 2021-07-07 NOTE — Patient Instructions (Signed)

## 2021-07-16 ENCOUNTER — Other Ambulatory Visit: Payer: Self-pay

## 2021-07-16 ENCOUNTER — Telehealth: Payer: Self-pay | Admitting: Nurse Practitioner

## 2021-07-16 MED ORDER — AMOXICILLIN-POT CLAVULANATE 875-125 MG PO TABS: 1.0000 | ORAL_TABLET | Freq: Two times a day (BID) | ORAL | 0 refills | Status: AC

## 2021-07-16 NOTE — Telephone Encounter (Signed)
Notified. 

## 2021-07-16 NOTE — Telephone Encounter (Signed)
Augmentin 875 mg po bid with food, #14, no refills Please contact us if symptoms do not resolve

## 2021-07-16 NOTE — Telephone Encounter (Signed)
Patient c/o LLQ pain for about 5 days. She has felt constipated but she is having regular bowel movements. Discomfort walking described as "pulling." Pain in the LLQ when she bends forward. "Feels like my diverticulitis again." Last treated in April with Augmentin. Patient has requested the same antibiotic. Many allergies.

## 2021-07-16 NOTE — Telephone Encounter (Signed)
Inbound call from pt requesting a call back stating that she is having pain on her left side. Please advise. Thank you.

## 2021-07-18 ENCOUNTER — Ambulatory Visit: Payer: Medicare Other | Admitting: Physical Therapy

## 2021-07-22 ENCOUNTER — Encounter: Payer: Medicare Other | Admitting: Physical Therapy

## 2021-07-22 ENCOUNTER — Telehealth: Payer: Self-pay | Admitting: Nurse Practitioner

## 2021-07-22 NOTE — Telephone Encounter (Signed)
Patient called seeking advise is having a lot of abdominal pain.

## 2021-07-22 NOTE — Telephone Encounter (Signed)
Spoke with the patient. She is on Augmentin for presumed diverticulitis. Started the antibiotic 07/17/21. Patient reports she was feeling better until yesterday. She complains of left flank pain. Denies any difficulty with urination. Presently has loose stools. Afebrile. Took 2 Gas-X and reports improvement of her symptoms. No openings on the schedules until 07/29/21. Patient also has multiple drug allergies.

## 2021-07-22 NOTE — Telephone Encounter (Signed)
Please schedule office appt with me or an APP soon. If symptoms worsening and no appts available advise urgent care or ED for evaluation.

## 2021-07-22 NOTE — Telephone Encounter (Signed)
Patient advised. Appointment scheduled for 07/23/21.

## 2021-07-23 ENCOUNTER — Other Ambulatory Visit (INDEPENDENT_AMBULATORY_CARE_PROVIDER_SITE_OTHER): Payer: Medicare Other

## 2021-07-23 ENCOUNTER — Ambulatory Visit: Payer: Medicare Other | Admitting: Nurse Practitioner

## 2021-07-23 ENCOUNTER — Other Ambulatory Visit: Payer: Self-pay

## 2021-07-23 ENCOUNTER — Ambulatory Visit: Payer: Medicare Other | Attending: Neurological Surgery | Admitting: Physical Therapy

## 2021-07-23 ENCOUNTER — Encounter: Payer: Self-pay | Admitting: Nurse Practitioner

## 2021-07-23 ENCOUNTER — Encounter: Payer: Self-pay | Admitting: Physical Therapy

## 2021-07-23 VITALS — BP 162/70 | HR 76 | Ht 61.5 in | Wt 190.2 lb

## 2021-07-23 DIAGNOSIS — R252 Cramp and spasm: Secondary | ICD-10-CM | POA: Diagnosis present

## 2021-07-23 DIAGNOSIS — R1084 Generalized abdominal pain: Secondary | ICD-10-CM

## 2021-07-23 DIAGNOSIS — R1032 Left lower quadrant pain: Secondary | ICD-10-CM | POA: Diagnosis not present

## 2021-07-23 DIAGNOSIS — M5412 Radiculopathy, cervical region: Secondary | ICD-10-CM | POA: Diagnosis present

## 2021-07-23 DIAGNOSIS — M542 Cervicalgia: Secondary | ICD-10-CM | POA: Insufficient documentation

## 2021-07-23 LAB — COMPREHENSIVE METABOLIC PANEL
ALT: 10 U/L (ref 0–35)
AST: 12 U/L (ref 0–37)
Albumin: 4.4 g/dL (ref 3.5–5.2)
Alkaline Phosphatase: 76 U/L (ref 39–117)
BUN: 15 mg/dL (ref 6–23)
CO2: 27 mEq/L (ref 19–32)
Calcium: 9.7 mg/dL (ref 8.4–10.5)
Chloride: 97 mEq/L (ref 96–112)
Creatinine, Ser: 0.72 mg/dL (ref 0.40–1.20)
GFR: 80.58 mL/min (ref >60.00)
Glucose, Bld: 234 mg/dL — ABNORMAL HIGH (ref 70–99)
Potassium: 3.7 mEq/L (ref 3.5–5.1)
Sodium: 135 mEq/L (ref 135–145)
Total Bilirubin: 0.6 mg/dL (ref 0.2–1.2)
Total Protein: 7.4 g/dL (ref 6.0–8.3)

## 2021-07-23 LAB — CBC WITH DIFFERENTIAL/PLATELET
Basophils Absolute: 0.1 10*3/uL (ref 0.0–0.1)
Basophils Relative: 1.3 % (ref 0.0–3.0)
Eosinophils Absolute: 0.2 10*3/uL (ref 0.0–0.7)
Eosinophils Relative: 2.2 % (ref 0.0–5.0)
HCT: 42.8 % (ref 36.0–46.0)
Hemoglobin: 14.5 g/dL (ref 12.0–15.0)
Lymphocytes Relative: 18.9 % (ref 12.0–46.0)
Lymphs Abs: 1.6 10*3/uL (ref 0.7–4.0)
MCHC: 34 g/dL (ref 30.0–36.0)
MCV: 90.1 fl (ref 78.0–100.0)
Monocytes Absolute: 0.8 10*3/uL (ref 0.1–1.0)
Monocytes Relative: 9.3 % (ref 3.0–12.0)
Neutro Abs: 5.6 10*3/uL (ref 1.4–7.7)
Neutrophils Relative %: 68.3 % (ref 43.0–77.0)
Platelets: 289 10*3/uL (ref 150.0–400.0)
RBC: 4.75 Mil/uL (ref 3.87–5.11)
RDW: 13.1 % (ref 11.5–15.5)
WBC: 8.2 10*3/uL (ref 4.0–10.5)

## 2021-07-23 NOTE — Therapy (Signed)
[address] [place], [place], [address] Phone: [phone]   Fax:  [phone]  Physical Therapy Treatment  Patient Details  Name: Ruth [name] MRN: [id number] Date of Birth: Sep 06, 1946 Referring Provider (PT): Perini   Encounter Date: 07/23/2021   PT End of Session - 07/23/21 0917     Visit Number 2    Date for PT Re-Evaluation 10/07/21    Authorization Type UHC Medicare    PT Start Time 0840    PT Stop Time 0930    PT Time Calculation (min) 50 min    Activity Tolerance Patient tolerated treatment well;Patient limited by pain    Behavior During Therapy [facility] for tasks assessed/performed             Past Medical History:  Diagnosis Date   Anxiety    CAD (coronary artery disease)    a. CABG x 4 in 2006 in [place] (LIMA->LAD, VG->Diag, VG->OM, VG->RCA); b. DES to midbody of SVG-PDA/PLA 07/2012; c. 03/2015 Myoview: EF 65%, small defect of mild severit in basal inferolateral wall, low risk.   Complication of anesthesia    "quit breathing when I got ?Inovar" (07/28/2012)   Depression    Diverticulitis    Diverticulosis    Dyslipidemia    Intolerant of statins   Fecal incontinence    GERD (gastroesophageal reflux disease)    Hyperlipidemia    Hyperplastic colon polyp    IBS (irritable bowel syndrome)    Labile hypertension    a. 09/2016 Renal Artery duplex: nl renal arteries.   Migraines    "had them in my 31's" (07/28/2012)   Multiple allergies    Obesity    Steatohepatitis    Type II diabetes mellitus ([place])    a. no meds, diet controlled - takes cinnamon.    Past Surgical History:  Procedure Laterality Date   ABDOMINAL HYSTERECTOMY  1970's   BREAST LUMPECTOMY     bilateral   CARDIAC CATHETERIZATION  2006   CORONARY ANGIOPLASTY WITH STENT PLACEMENT  07/28/2012   "1; first time for me" (07/28/2012)   CORONARY ARTERY BYPASS GRAFT  2006   CABG X4   CORONARY STENT INTERVENTION N/A  03/01/2017   Procedure: Coronary Stent Intervention;  Surgeon: Troy Sine, MD;  Location: [place] [facility];  Service: Cardiovascular;  Laterality: N/A;   CORONARY STENT INTERVENTION N/A 06/23/2019   Procedure: CORONARY STENT INTERVENTION;  Surgeon: Martinique, Peter M, MD;  Location: [place] [facility];  Service: Cardiovascular;  Laterality: N/A;   EXCISIONAL HEMORRHOIDECTOMY  1970's   INGUINAL HERNIA REPAIR  1970's?   left   LEFT HEART CATH AND CORS/GRAFTS ANGIOGRAPHY N/A 03/01/2017   Procedure: Left Heart Cath and Cors/Grafts Angiography;  Surgeon: Troy Sine, MD;  Location: [place] [facility];  Service: Cardiovascular;  Laterality: N/A;   LEFT HEART CATH AND CORS/GRAFTS ANGIOGRAPHY N/A 06/22/2019   Procedure: LEFT HEART CATH AND CORS/GRAFTS ANGIOGRAPHY;  Surgeon: Belva Crome, MD;  Location: [place] [facility];  Service: Cardiovascular;  Laterality: N/A;   LEFT HEART CATHETERIZATION WITH CORONARY/GRAFT ANGIOGRAM N/A 07/28/2012   Procedure: LEFT HEART CATHETERIZATION WITH Beatrix Fetters;  Surgeon: Burnell Blanks, MD;  Location: [facility] CATH LAB;  Service: Cardiovascular;  Laterality: N/A;   PERCUTANEOUS CORONARY STENT INTERVENTION (PCI-S)  07/28/2012   Procedure: PERCUTANEOUS CORONARY STENT INTERVENTION (PCI-S);  Surgeon: Burnell Blanks, MD;  Location: [facility] C[facility] LAB;  Service: Cardiovascular;;   TONSILLECTOMY AND ADENOIDECTOMY  ~  1951    There were no vitals filed for this visit.   Subjective Assessment - 07/23/21 0846     Subjective Patient reports that she felt good but had some right shoulder pain and diffiuclty moving it, difficulty dressing, reports that she has had issues with diverticulitis    Currently in Pain? Yes    Pain Score 6     Pain Location Shoulder    Pain Orientation Right    Pain Descriptors / Indicators Constant    Aggravating Factors  dressing, doing hair                               [facility] Adult PT  Treatment/Exercise - 07/23/21 0001       Exercises   Exercises Neck      Neck Exercises: Seated   Shoulder Shrugs 15 reps    Shoulder Rolls 10 reps    Other Seated Exercise thigh slides      Moist Heat Therapy   Number Minutes Moist Heat 10 Minutes    Moist Heat Location Cervical      Electrical Stimulation   Electrical Stimulation Location right upper trap andneck area into the right upper arm    Electrical Stimulation Action IFC    Electrical Stimulation Parameters sitting    Electrical Stimulation Goals Pain      Manual Therapy   Manual Therapy Soft tissue mobilization;Passive ROM    Soft tissue mobilization to the right cervical area, right upper trap and right upper arm    Passive ROM to the right shoulder                       PT Short Term Goals - 07/23/21 0919       PT SHORT TERM GOAL #1   Title pt will be I with inital HEP    Status Partially Met               PT Long Term Goals - 07/07/21 1601       PT LONG TERM GOAL #1   Title pt will be I with all advanced HEP    Time 12    Period Weeks    Status New      PT LONG TERM GOAL #2   Title decrease pain overall 50%    Time 12    Period Weeks    Status New      PT LONG TERM GOAL #3   Title report 50% less pain with driving    Time 12    Period Weeks    Status New      PT LONG TERM GOAL #4   Title increase cervical ROM 25%    Time 12    Period Weeks    Status New      PT LONG TERM GOAL #5   Title understand posture and body mechanics    Time 12    Period Weeks    Status New                   Plan - 07/23/21 0917     Clinical Impression Statement Patient reports that she is allergic to all lotions and soaps, with the STM did this dry and tried to do some trigger point release in the right upper trap and neck area, she is having some right upper arm pain that she did not have two  weeks ago, she is unsure of the cause, she is tight and tender in the right posterior  upper arm.  She reports that most all movements hurt and she is having trouble dressing and doing her hair    PT Next Visit Plan she will see the doctor today for th estomach issues, will see if we can get her moving some    Consulted and Agree with Plan of Care Patient             Patient will benefit from skilled therapeutic intervention in order to improve the following deficits and impairments:  Decreased range of motion, Increased muscle spasms, Pain, Improper body mechanics, Decreased strength, Decreased mobility, Postural dysfunction  Visit Diagnosis: Cervicalgia  Cramp and spasm  Radiculopathy, cervical region     Problem List Patient Active Problem List   Diagnosis Date Noted   Dizziness 12/24/2020   Multiple drug allergies 12/24/2020   Dysuria 01/17/2020   Non-ST elevation (NSTEMI) myocardial infarction [facility])    Chest pain 06/21/2019   IBS (irritable bowel syndrome) 06/21/2019   Hyperbilirubinemia 06/21/2019   GERD (gastroesophageal reflux disease) 06/21/2019   Low back pain 05/11/2019   Atopic dermatitis 04/03/2019   Other specified health status 08/10/2018   Encounter for general adult medical examination without abnormal findings 06/15/2018   Noninflammatory disorder of vagina 06/13/2018   Arthralgia of right temporomandibular joint 10/28/2017   Bilateral impacted cerumen 10/28/2017   Chronic otitis externa of left ear 10/28/2017   Frequency of micturition 09/28/2017   Benign neoplasm of colon 08/24/2017   Spinal stenosis of lumbar region 08/24/2017   Fall 04/27/2017   Abnormal nuclear stress test    Other stressful life events affecting family and household 01/26/2017   Diverticula of intestine 09/28/2016   Eructation 09/28/2016   Tinea unguium 06/24/2016   Neck pain 03/12/2016   Hypertensive heart disease 05/25/2015   Hyperglycemia due to type 2 diabetes mellitus ([place]) 03/22/2015   Pure hypercholesterolemia 03/22/2015   Type II or unspecified type  diabetes mellitus without mention of complication, uncontrolled 02/14/2014   Hyponatremia 02/13/2014   HTN (hypertension) 02/13/2014   Sepsis secondary to UTI ([place]) 02/12/2014   Acute pyelonephritis 02/12/2014   Pain in left leg 11/13/2013   Candidiasis 09/12/2013   Impingement syndrome of left shoulder region 02/14/2013   Spasm 12/14/2012   Unstable angina (HCC) 07/29/2012   Obesity (BMI 30-39.9) 07/29/2012   Chronic ulcer of skin ([place]) 06/16/2012   Hemorrhoids 06/16/2012   Bloating 06/15/2012   Generalized abdominal pain 06/15/2012   Family history of colon cancer 06/15/2012   Breast lump 01/11/2012   Microscopic hematuria 01/11/2012   Myalgia 11/24/2011   Malignant HTN with heart disease, w/o CHF, 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 [place], [place], [address] Phone: [phone]   Fax:  [phone]  Name: EYANA STOLZE MRN: [id number] Date of Birth: 11/05/1945

## 2021-07-23 NOTE — Progress Notes (Signed)
07/23/2021 Ruth [name] [id number] 14-May-1946   Chief Complaint: LLQ pain  History of Present Illness: Ruth [name] is a 75 year old female with a past medical history of  anxiety, depression, hypertension, CAD s/p 4 vessel CABG 2006 s/p DES 2013 on Effient,  DM II, hepatic steatosis, GERD, diverticulitis and colon polyps. She is followed by Dr. Fuller Plan. She presents to our office today with complaints of LLQ pain which radiates across to the RLQ with associated bloat which started about 2 weeks ago. She had severe knife like stabbing pain to the LLQ 2 days ago and she almost went to the ED. No fevers. She is passing a hard stool daily followed by multiple loose stools. She sometimes manually removes hard stool from the rectum then passes loose stools. She also has issues with intermittent fecal incontinence. Infrequent rectal bleeding. Her most recent colonoscopy was in 2015 which showed diverticulosis, a sessile polyp ranging 3 to 5 mm removed from the cecum, decreased rectal tone sphincter and macerated mucosa within the anal canal. Path-cecal polyp was a tubular adenoma.  Random colon biopsies were negative for microscopic colitis.  She was last seen in office by Tye Savoy NP on 12/16/2020 following an episode of left sided diverticulitis confirmed by a noncontrast CT per her urologist which was treated with Augmentin x 10 days. A colonoscopy to rule out colorectal cancer was recommended but she was having issues with hypertension at that time so she did not wish to purse a colonoscopy until she saw her cardiologist.   Labs 12/25/2020: WBC 8.2. Hg 14.6. PLT 265.   Current Medications, Allergies, Past Medical History, Past Surgical History, Family History and Social History were reviewed in [employer] record.  Review of Systems:   Constitutional: Negative for fever, sweats, chills or weight loss.  Respiratory: Negative for shortness of breath.    Cardiovascular: Negative for chest pain, palpitations and leg swelling.  Gastrointestinal: See HPI.  Musculoskeletal: Negative for back pain or muscle aches.  Neurological: Negative for dizziness, headaches or paresthesias.   Physical Exam: BP (!) 162/70   Pulse 76   Ht 5' 1.5" (1.562 m)   Wt 190 lb 4 oz (86.3 kg)   BMI 35.37 kg/m  General: 75 year old female in NAD.  Head: Normocephalic and atraumatic. Eyes: No scleral icterus. Conjunctiva pink . Ears: Normal auditory acuity. Mouth: Dentition intact. No ulcers or lesions.  Lungs: Clear throughout to auscultation. Heart: Regular rate and rhythm, no murmur. Abdomen: Soft, nondistended. Mild RLQ and LLQ tenderness without rebound or guarding. No masses or hepatomegaly. Normal bowel sounds x 4 quadrants.  Rectal: Deferred.  Musculoskeletal: Symmetrical with no gross deformities. Extremities: No edema. Neurological: Alert oriented x 4. No focal deficits.  Psychological: Alert and cooperative. Normal mood and affect  Assessment and Recommendations:  19) 75 year old female with a history of left sided diverticulitis presents with LLQ pain which radiates across to the RLQ. -CBC, CMP -CTAP with oral contrast only (patient is allergic to IV contrast) -Push fluids. Bland diet. -Patient to go to the ED if she develops severe abdominal pain.  -Further recommendations to be determined after the above evaluation completed   2) GERD, stable   3) History of a tubular adenomatous colon polyp per colonoscopy 2015 -Patient previously declined to schedule a colonoscopy post diverticulitis episode and for colon polyps surveillance  -To re discuss scheduling a colonoscopy after the above CTAP results received   4)  History of CAD s/p CABG and s/p DES to RCA graft on Effient

## 2021-07-23 NOTE — Patient Instructions (Signed)
LABS:  Lab work has been ordered for you today. Our lab is located in the basement. Press "B" on the elevator. The lab is located at the first door on the left as you exit the elevator.  HEALTHCARE LAWS AND MY CHART RESULTS: Due to recent changes in healthcare laws, you may see the results of your imaging and laboratory studies on MyChart before your provider has had a chance to review them.   We understand that in some cases there may be results that are confusing or concerning to you. Not all laboratory results come back in the same time frame and the provider may be waiting for multiple results in order to interpret others.  Please give [place] 48 hours in order for your provider to thoroughly review all the results before contacting the office for clarification of your results.   You have been scheduled for a CT scan of the abdomen and pelvis at [place].  You are scheduled on 07/25/21 at 10:30 am. You should arrive 15 minutes prior to your appointment time for registration. Please follow the written instructions below on the day of your exam: ________________________________________________________________________  RECOMMENDATIONS: Push fluid intake. Eat a bland diet. Go to the emergency room if the abdominal pain gets worse.  It was great seeing you today! Thank you for entrusting me with your care and choosing [facility].  Noralyn Pick, CRNP

## 2021-07-24 NOTE — Progress Notes (Signed)
Reviewed and agree with management plan.  Shyenne Maggard T. Keino Placencia, MD FACG 

## 2021-07-25 ENCOUNTER — Other Ambulatory Visit: Payer: Self-pay

## 2021-07-25 ENCOUNTER — Ambulatory Visit (INDEPENDENT_AMBULATORY_CARE_PROVIDER_SITE_OTHER)
Admission: RE | Admit: 2021-07-25 | Discharge: 2021-07-25 | Disposition: A | Discharge status: Home / Self Care | Payer: Medicare Other | Source: Ambulatory Visit | Attending: Nurse Practitioner | Admitting: Nurse Practitioner

## 2021-07-25 DIAGNOSIS — R1032 Left lower quadrant pain: Secondary | ICD-10-CM

## 2021-07-25 DIAGNOSIS — R1084 Generalized abdominal pain: Secondary | ICD-10-CM

## 2021-07-28 ENCOUNTER — Telehealth: Payer: Self-pay

## 2021-07-28 ENCOUNTER — Encounter: Payer: Medicare Other | Admitting: Physical Therapy

## 2021-07-28 NOTE — Telephone Encounter (Signed)
-----   Message from Noralyn Pick, NP sent at 07/24/2021  3:31 PM EST ----- Lenna Sciara please contact the patient let her know her glucose level was significantly elevated at 234 and she should follow-up with her endocrinologist and PCP regarding this test result.  Your white and red blood cell counts were normal.  Thank you

## 2021-07-28 NOTE — Telephone Encounter (Signed)
Notified patient of message from [place] , patient expressed understanding and agreement.  While on the phone, patient asked what she can take to make her have a bowel movement and if it will be ok with her current medications.

## 2021-07-29 ENCOUNTER — Other Ambulatory Visit: Payer: Self-pay

## 2021-07-29 ENCOUNTER — Telehealth: Payer: Self-pay | Admitting: Nurse Practitioner

## 2021-07-29 ENCOUNTER — Encounter: Payer: Self-pay | Admitting: Physical Therapy

## 2021-07-29 ENCOUNTER — Ambulatory Visit: Payer: Medicare Other | Admitting: Physical Therapy

## 2021-07-29 DIAGNOSIS — R1084 Generalized abdominal pain: Secondary | ICD-10-CM

## 2021-07-29 DIAGNOSIS — R252 Cramp and spasm: Secondary | ICD-10-CM

## 2021-07-29 DIAGNOSIS — M542 Cervicalgia: Secondary | ICD-10-CM | POA: Diagnosis not present

## 2021-07-29 DIAGNOSIS — M5412 Radiculopathy, cervical region: Secondary | ICD-10-CM

## 2021-07-29 NOTE — Telephone Encounter (Signed)
Patient is returning your call.  

## 2021-07-29 NOTE — Therapy (Signed)
[place] [place], [place], [address] Phone: [phone]   Fax:  [phone]  Physical Therapy Treatment  Patient Details  Name: Ruth [name] MRN: [id number] Date of Birth: 01-12-1946 Referring Provider (PT): Perini   Encounter Date: 07/29/2021   PT End of Session - 07/29/21 1002     Visit Number 3    Date for PT Re-Evaluation 10/07/21    Authorization Type [employer] Medicare    PT Start Time 0922    PT Stop Time 1015    PT Time Calculation (min) 53 min    Activity Tolerance Patient tolerated treatment well;Patient limited by pain    Behavior During Therapy [facility] for tasks assessed/performed             Past Medical History:  Diagnosis Date   Anxiety    CAD (coronary artery disease)    a. CABG x 4 in 2006 in [place] (LIMA->LAD, VG->Diag, VG->OM, VG->RCA); b. DES to midbody of SVG-PDA/PLA 07/2012; c. 03/2015 Myoview: EF 65%, small defect of mild severit in basal inferolateral wall, low risk.   Complication of anesthesia    "quit breathing when I got ?Inovar" (07/28/2012)   Depression    Diverticulitis    Diverticulosis    Dyslipidemia    Intolerant of statins   Fecal incontinence    GERD (gastroesophageal reflux disease)    Hyperlipidemia    Hyperplastic colon polyp    IBS (irritable bowel syndrome)    Labile hypertension    a. 09/2016 Renal Artery duplex: nl renal arteries.   Migraines    "had them in my 60's" (07/28/2012)   Multiple allergies    Obesity    Steatohepatitis    Type II diabetes mellitus ([place])    a. no meds, diet controlled - takes cinnamon.    Past Surgical History:  Procedure Laterality Date   ABDOMINAL HYSTERECTOMY  1970's   BREAST LUMPECTOMY     bilateral   CARDIAC CATHETERIZATION  2006   CORONARY ANGIOPLASTY WITH STENT PLACEMENT  07/28/2012   "1; first time for me" (07/28/2012)   CORONARY ARTERY BYPASS GRAFT  2006   CABG X4   CORONARY STENT INTERVENTION N/A  03/01/2017   Procedure: Coronary Stent Intervention;  Surgeon: Troy Sine, MD;  Location: [place] [facility];  Service: Cardiovascular;  Laterality: N/A;   CORONARY STENT INTERVENTION N/A 06/23/2019   Procedure: CORONARY STENT INTERVENTION;  Surgeon: Martinique, Peter M, MD;  Location: [place] [facility];  Service: Cardiovascular;  Laterality: N/A;   EXCISIONAL HEMORRHOIDECTOMY  1970's   INGUINAL HERNIA REPAIR  1970's?   left   LEFT HEART CATH AND CORS/GRAFTS ANGIOGRAPHY N/A 03/01/2017   Procedure: Left Heart Cath and Cors/Grafts Angiography;  Surgeon: Troy Sine, MD;  Location: [place] [facility];  Service: Cardiovascular;  Laterality: N/A;   LEFT HEART CATH AND CORS/GRAFTS ANGIOGRAPHY N/A 06/22/2019   Procedure: LEFT HEART CATH AND CORS/GRAFTS ANGIOGRAPHY;  Surgeon: Belva Crome, MD;  Location: [place] [facility];  Service: Cardiovascular;  Laterality: N/A;   LEFT HEART CATHETERIZATION WITH CORONARY/GRAFT ANGIOGRAM N/A 07/28/2012   Procedure: LEFT HEART CATHETERIZATION WITH Beatrix Fetters;  Surgeon: Burnell Blanks, MD;  Location: [facility] CATH LAB;  Service: Cardiovascular;  Laterality: N/A;   PERCUTANEOUS CORONARY STENT INTERVENTION (PCI-S)  07/28/2012   Procedure: PERCUTANEOUS CORONARY STENT INTERVENTION (PCI-S);  Surgeon: Burnell Blanks, MD;  Location: [facility] CATH LAB;  Service: Cardiovascular;;   TONSILLECTOMY AND ADENOIDECTOMY  ~  1951    There were no vitals filed for this visit.   Subjective Assessment - 07/29/21 0924     Subjective Patient reports that she feels a little better after the treatment but after about a day it is all back.  She has pain in the right neck and upper trap and some pain in the right bicep with difficulty reaching and lifting.  She ahs a lot of issues and cannot lie down, does not tolerate any gels or lotions for masssage    Currently in Pain? Yes    Pain Score 6     Pain Location Neck    Pain Orientation Right    Pain Descriptors /  Indicators Constant    Aggravating Factors  reaching, any lifting                               [facility] Adult PT Treatment/Exercise - 07/29/21 0001       Neck Exercises: Theraband   Shoulder Extension 15 reps    Shoulder Extension Limitations yellow    Rows 15 reps    Rows Limitations yellow    Shoulder External Rotation 15 reps    Shoulder External Rotation Limitations yellow      Neck Exercises: Seated   Shoulder Shrugs 15 reps    Shoulder Rolls 10 reps    Other Seated Exercise thigh slides    Other Seated Exercise biceps with 1# stick      Moist Heat Therapy   Number Minutes Moist Heat 10 Minutes    Moist Heat Location Cervical      Electrical Stimulation   Electrical Stimulation Location right upper trap andneck area into the right upper arm    Electrical Stimulation Action IFC    Electrical Stimulation Parameters sitting    Electrical Stimulation Goals Pain      Manual Therapy   Manual Therapy Manual Traction    Soft tissue mobilization to the right cervical area, right upper trap and right upper arm    Passive ROM to the right shoulder    Manual Traction cervical with her sitting                       PT Short Term Goals - 07/29/21 1004       PT SHORT TERM GOAL #1   Title pt will be I with inital HEP    Status Partially Met               PT Long Term Goals - 07/29/21 1004       PT LONG TERM GOAL #1   Title pt will be I with all advanced HEP    Status On-going      PT LONG TERM GOAL #2   Title decrease pain overall 50%    Status On-going                   Plan - 07/29/21 1003     Clinical Impression Statement I added some exercises and talked with her about the need to keep moving and have postureal strength to support the neck and the shoulders, I added manual cervical traction that she reports felt really good.  Has significant knots and tenderness in the right neck, upper trap area    PT Next Visit  Plan continue to add exercises as tolerated    Consulted and Agree with Plan of Care  Patient             Patient will benefit from skilled therapeutic intervention in order to improve the following deficits and impairments:  Decreased range of motion, Increased muscle spasms, Pain, Improper body mechanics, Decreased strength, Decreased mobility, Postural dysfunction  Visit Diagnosis: Cervicalgia  Cramp and spasm  Radiculopathy, cervical region     Problem List Patient Active Problem List   Diagnosis Date Noted   Dizziness 12/24/2020   Multiple drug allergies 12/24/2020   Dysuria 01/17/2020   Non-ST elevation (NSTEMI) myocardial infarction [facility])    Chest pain 06/21/2019   IBS (irritable bowel syndrome) 06/21/2019   Hyperbilirubinemia 06/21/2019   GERD (gastroesophageal reflux disease) 06/21/2019   Low back pain 05/11/2019   Atopic dermatitis 04/03/2019   Other specified health status 08/10/2018   Encounter for general adult medical examination without abnormal findings 06/15/2018   Noninflammatory disorder of vagina 06/13/2018   Arthralgia of right temporomandibular joint 10/28/2017   Bilateral impacted cerumen 10/28/2017   Chronic otitis externa of left ear 10/28/2017   Frequency of micturition 09/28/2017   Benign neoplasm of colon 08/24/2017   Spinal stenosis of lumbar region 08/24/2017   Fall 04/27/2017   Abnormal nuclear stress test    Other stressful life events affecting family and household 01/26/2017   Diverticula of intestine 09/28/2016   Eructation 09/28/2016   Tinea unguium 06/24/2016   Neck pain 03/12/2016   Hypertensive heart disease 05/25/2015   Hyperglycemia due to type 2 diabetes mellitus ([address]) 03/22/2015   Pure hypercholesterolemia 03/22/2015   Type II or unspecified type diabetes mellitus without mention of complication, uncontrolled 02/14/2014   Hyponatremia 02/13/2014   HTN (hypertension) 02/13/2014   Sepsis secondary to UTI ([place]) 02/12/2014    Acute pyelonephritis 02/12/2014   Pain in left leg 11/13/2013   Candidiasis 09/12/2013   Impingement syndrome of left shoulder region 02/14/2013   Spasm 12/14/2012   Unstable angina ([facility]) 07/29/2012   Obesity (BMI 30-39.9) 07/29/2012   Chronic ulcer of skin ([place]) 06/16/2012   Hemorrhoids 06/16/2012   Bloating 06/15/2012   Generalized abdominal pain 06/15/2012   Family history of colon cancer 06/15/2012   Breast lump 01/11/2012   Microscopic hematuria 01/11/2012   Myalgia 11/24/2011   Malignant HTN with heart disease, w/o CHF, 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 [place], [place], [address] Phone: [phone]   Fax:  [phone]  Name: Ruth [name] MRN: [id number] Date of Birth: 03-29-1946

## 2021-07-30 ENCOUNTER — Ambulatory Visit: Payer: Medicare Other | Admitting: Cardiovascular Disease

## 2021-07-30 VITALS — BP 144/76 | HR 64 | Ht 61.5 in | Wt 185.0 lb

## 2021-07-30 DIAGNOSIS — I2510 Atherosclerotic heart disease of native coronary artery without angina pectoris: Secondary | ICD-10-CM | POA: Diagnosis not present

## 2021-07-30 DIAGNOSIS — E785 Hyperlipidemia, unspecified: Secondary | ICD-10-CM

## 2021-07-30 DIAGNOSIS — M47812 Spondylosis without myelopathy or radiculopathy, cervical region: Secondary | ICD-10-CM

## 2021-07-30 DIAGNOSIS — I10 Essential (primary) hypertension: Secondary | ICD-10-CM | POA: Diagnosis not present

## 2021-07-30 DIAGNOSIS — Z789 Other specified health status: Secondary | ICD-10-CM | POA: Diagnosis not present

## 2021-07-30 MED ORDER — AMLODIPINE BESYLATE 2.5 MG PO TABS: 2.5000 mg | ORAL_TABLET | Freq: Every evening | ORAL | 3 refills | Status: DC

## 2021-07-30 MED ORDER — AMLODIPINE BESYLATE 5 MG PO TABS: ORAL_TABLET | ORAL | 3 refills | Status: DC

## 2021-07-30 NOTE — Patient Instructions (Signed)
Medication Instructions:  Take amlodipine 5 mg in the morning and 2.5 mg in the evening.  *If you need a refill on your cardiac medications before your next appointment, please call your pharmacy*   Lab Work: None   Testing/Procedures: None    Follow-Up: At [facility], you and your health needs are our priority.  As part of our continuing mission to provide you with exceptional heart care, we have created designated Provider Care Teams.  These Care Teams include your primary Cardiologist (physician) and Advanced Practice Providers (APPs -  Physician Assistants and Nurse Practitioners) who all work together to provide you with the care you need, when you need it.  We recommend signing up for the patient portal called "MyChart".  Sign up information is provided on this After Visit Summary.  MyChart is used to connect with patients for Virtual Visits (Telemedicine).  Patients are able to view lab/test results, encounter notes, upcoming appointments, etc.  Non-urgent messages can be sent to your provider as well.   To learn more about what you can do with MyChart, go to [email or web address].    Your next appointment:   6 month(s)  The format for your next appointment:   In Person  Provider:   [name], MD

## 2021-07-30 NOTE — Progress Notes (Signed)
Patient ID: Ruth [name], female   DOB: 09-26-1945, 75 y.o.   MRN: [id number]   Primary M.D.: Dr. Crist Infante  HPI: Ms. Robert Sunga is a 75 year old female who presents for a 6 month follow-up cardiology evaluation.  Ms Gal has a history of coronary artery disease.  While living in [place] she developed chest discomfort and in 11-19-04 underwent CABG surgery 4 at the [facility] in [place] by Dr. Marzetta Merino.  She had a LIMA to LAD, SVG to diagonal, SVG to obtuse marginal, and SVG to the RCA.   Her husband died in 19-Nov-2010 and she moved to the [place] area.  In Nov 20, 2011, she experienced symptoms of increasing shortness of breath.  She underwent cardiac catheterization and a stent was placed in the vein graft to her RCA.  She states that she has not had any subsequent stress testing.  She is a former patient of Dr. Einar Gip and an echocardiogram in July 2014 showed normal LV size and function with mild concentric left ventricular hypertrophy.  She had mild to moderate mitral regurgitation, mild tricuspid regurgitation, and mild primary hypertension with an estimated PA pressure 39 mm.  She has a history of hypertension for at least 15-20 years.  She states her blood pressure at home typically is in the 145-150 range, but her blood pressure is always elevated when she goes to the doctor's office.  She has a history of hyperlipidemia and apparently has been intolerant to statins but has never tried Zetia.  There is a history of diabetes mellitus but is not on therapy and states this is diet controlled.    She admits to  increased stress.  Her mother died in 12/18/2014 and recently her dog died.  She does not routinely exercise.  When I initially saw her in June 2016 she has been taking amlodipine 5 mg, losartan 50 mg twice a day and metoprolol 50 mrem twice a day for hypertension and CAD.  She has a history of multiple allergies.  She states that she is Intolerant to amlodipine as  well as Spironolactone.  She has been having labile blood pressure.  In the past. She became dehydrated on diarrhetic therapy.    An echo Doppler study on 03/13/2015  showed an ejection fraction at 55-60%.  There was mild mitral regurgitation, mild left atrial dilatation, and very mild pulmonary hypertension with an estimated PA pressure 32 mm.  A nuclear perfusion study done on 03/21/2015 was low risk and demonstrated a very small region of mild ischemia in the basal anterolateral wall.  She had normal LV function with an EF of 65%.  She developed recurrent episodes of chest pain in June 2018 which  led to her undergoing definitive cardiac catheterization on 03/01/2017.  She was found to have preserved global LV contractility with an EF of 55-60%.  There was significant multivessel CAD with 70% diffuse proximal LAD stenoses, total occlusion of the circumflex and OM1 proximally, and 75% proximal RCA stenosis with occlusion of the RCA prior to the acute margin.  She had a patent LIMA graft supplying the mid LAD, which may also anastomose into the diagonal vessel, but this was uncertain.  The vein graft supplying the distal marginal circumflex branch had smooth 60% proximal to mid body stenosis with TIMI-3 flow.  The vein graft supplying the distal RCA had focal 60% stenosis followed by 99% thrombotic stenosis beyond a stented segment.  She underwent successful PCI to this vein graft to  the RCA with insertion of a 3.532 mm Synergy stent postdilated to 3.71 mm with a Radiation protection practitioner used for distal graft protection.  It was recommended that she continue to antiplatelet therapy indefinitely.  Ms. Rozas has significant allergies to multiple medications.  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  She has been on losartan 50 Milligan grams twice a day, metoprolol 100 mg twice a day, and continues to take  aspirin 81 mg and Effient 10 mg.  She did not tolerate Plavix or Brilinta.  She states that she is intolerant to statins.  She has no interest in PCSK9 inhibition.    Since I saw her in October 2018, she was seen in the office at least 5 times by extenders as well as pharmacists.  She is intolerant to numerous medications.  She last saw Nehemiah Massed, [facility] on December 02, 2017 who suggested the possibility of minoxidil but she continued to deny this medication change.  She states most recently her blood pressure at home has been ranging from 325-498 systolically and 26-41 diastolically.  She had called the office stating that she was having pain running through the area where she had a stent.  This was relieved with "water and vinegar which she drinks for gas.   When I saw her in May 2019, her blood pressure continued to be elevated in the office despite taking amlodipine 2.5 mg (she stated she could not tolerate 5 mg), metoprolol 100 mg twice a day, and she was taking valsartan 160 in the morning and only three quarters of a tablet in the evening.  With her blood pressure elevation I suggested further titration of valsartan to 160 twice daily and added Cardura 1 mg initially at night with plans to titrate to 2 mg.  I recommended follow-up hypertensive clinic evaluations with Joslyn Hy as well as Raquel Joellyn Haff who she has seen on numerous occasions.  He continues to admit to intolerance to numerous medications.  I saw her in February 2020 at which time she was feeling significantly better.   She continued to have blood pressure elevation in the office and told me typically blood pressure at home was ranging around 583 systolically.  Ms. Borawski was admitted to the hospital with recurrent abdominal pain which is her anginal equivalent.  I recommended repeat definitive cardiac catheterization since prior to her last intervention she experienced the same symptoms which improved  following intervention to her vein graft to RCA.  Diagnostic catheterization was done on June 22, 2019 by Dr. Tamala Julian and she again had developed restenosis within the stent in the vein graft to her RCA.  She also had new stenosis of 90% in the vein graft supplying her circumflex marginal vessel.  Her LIMA to LAD and diagonal remain patent.  She had an occluded native mid RCA and circumflex and high-grade 70% proximal LAD with 90% mid LAD stenosis with an occluded second diagonal.  After much discussion we ultimately recommended PCI and the following day she underwent successful intervention with stenting to the vein graft supplying her circumflex marginal vessel and PTCA for the in-stent restenosis in the vein graft supplying her RCA.  Subsequently, her symptoms have improved.  Of note, during her hospitalization on presentation she had marked hyperlipidemia highly suggestive of familial hyperlipidemia with total cholesterol at 300, LDL cholesterol 224 and triglycerides 183.  In the past she has not been  able to tolerate statins or Zetia.  I recommended initiation of PCSK9 therapy.   She had called the office  with complaints of elevated blood pressure.  Amlodipine was increased to 7.5 mg daily.  She was to continue to take and record blood pressure readings daily.  She again called the office on August 18, 2019 with complaints of a sharp epigastric discomfort that she felt was gas but Gas-X only partially improved her symptoms.  She was w evaluated by Bunnie Domino, NP  in the office on August 21, 2019 for follow-up evaluation.  She was having some frequent burping and gas symptoms.  Her blood pressure was elevated at 158/92.  She had undergone prior abdominal ultrasound which was negative for cholelithiasis.  GI evaluation was recommended.   I  saw her in a telemedicine visit in February 2021.  At that time she felt improved.  Since initiating Praluent, lipid studies had improved from an LDL of 224   To 123 on September 29, 2019.  I mentioned to her that she was only on the 75 mg dose of Praluent and my recommendation would be to titrate this to the 150 mg dose.  She also continued to have blood pressure lability and was adamant about increasing medications.  I saw her in a telemedicine visit in February 2021.  Her blood pressure continued to be elevated and I recommended she increase amlodipine to 5 mg.  Her LDL cholesterol was improved but remained elevated at 123 and I again recommended increasing Praluent to 150 mg rather than the reduced dose of 75 mg every 2 weeks.  I saw her in November 2021.  Over the prior year she had  issues with diverticulitis and irritable bowel syndrome.  She also has had urinary issues and underwent cystoscopy by Dr. Vikki Ports.  She was in need for Praluent and we had obtained a sample of her next dose.  She denies any recurrent chest pain.  Since I last saw her, she was recently notified by Sande Rives after the patient had called our answering service regarding blood pressure concerns.  She admitted that she gets very anxious and at times her blood pressure increases.  She was encouraged to keep a log of her blood pressure and bring it to her follow-up office visit.  I last saw her Jan 29, 2021.  At that time she told me her blood pressure typically runs around 140/70 but at night it can increase to as high as 220/105.  She states her blood pressure last night was 180/90.  She has numerous medication intolerances.  Currently she has been taking amlodipine 5 mg, hydralazine only on a as needed basis 10 mg, metoprolol tartrate 100 mg twice a day and valsartan 160 mg twice a day.  She was requesting another dose of Praluent to be provided.  I checked with our pharmacist and apparently she has been approved for continued therapy.  She continues to have a lot of anxiety.  She denies chest pain.  During that evaluation, with her blood pressure lability I recommended further  titration of amlodipine to 10 mg up from 5 mg and to take this in the morning and not wait 2 months to take a medication.  Apparently she has been staggering all her medications and was taking these medicines at 2-hour intervals throughout the day.  She continued the metoprolol 100 mg twice a day and had a prescription for hydralazine and continued on valsartan 160 twice a day.  I strongly recommended she increase her dose of Praluent from 75 mg up to the 150 mg dose but she did not want to do this.  Her most recent LDL was still elevated at 108.  She was given a sample of Praluent in the office.  Presently, she states she has had difficulty with cervical arthritis and has some occasional neck swelling and right arm discomfort.  Apparently she has not taken Praluent over the last 2 months.  She admits to right shoulder upper back tenderness.  She never increased amlodipine to 10 mg and is taking 7.5 mg daily.  She continues to be on irbesartan 1 and 50 mg, Toprol tartrate 100 mg twice a day valsartan 160 twice a day and is not taking hydralazine.  She presents for evaluation.  Past Medical History:  Diagnosis Date   Anxiety    CAD (coronary artery disease)    a. CABG x 4 in 2006 in [facility] (LIMA->LAD, VG->Diag, VG->OM, VG->RCA); b. DES to midbody of SVG-PDA/PLA 07/2012; c. 03/2015 Myoview: EF 65%, small defect of mild severit in basal inferolateral wall, low risk.   Complication of anesthesia    "quit breathing when I got ?[place]" (07/28/2012)   Depression    Diverticulitis    Diverticulosis    Dyslipidemia    Intolerant of statins   Fecal incontinence    GERD (gastroesophageal reflux disease)    Hyperlipidemia    Hyperplastic colon polyp    IBS (irritable bowel syndrome)    Labile hypertension    a. 09/2016 Renal Artery duplex: nl renal arteries.   Migraines    "had them in my 35's" (07/28/2012)   Multiple allergies    Obesity    Steatohepatitis    Type II diabetes mellitus ([place])     a. no meds, diet controlled - takes cinnamon.    Past Surgical History:  Procedure Laterality Date   ABDOMINAL HYSTERECTOMY  1970's   BREAST LUMPECTOMY     bilateral   CARDIAC CATHETERIZATION  2006   CORONARY ANGIOPLASTY WITH STENT PLACEMENT  07/28/2012   "1; first time for me" (07/28/2012)   CORONARY ARTERY BYPASS GRAFT  2006   CABG X4   CORONARY STENT INTERVENTION N/A 03/01/2017   Procedure: Coronary Stent Intervention;  Surgeon: Troy Sine, MD;  Location: [facility];  Service: Cardiovascular;  Laterality: N/A;   CORONARY STENT INTERVENTION N/A 06/23/2019   Procedure: CORONARY STENT INTERVENTION;  Surgeon: Martinique, Peter M, MD;  Location: [facility];  Service: Cardiovascular;  Laterality: N/A;   EXCISIONAL HEMORRHOIDECTOMY  1970's   INGUINAL HERNIA REPAIR  1970's?   left   LEFT HEART CATH AND CORS/GRAFTS ANGIOGRAPHY N/A 03/01/2017   Procedure: Left Heart Cath and Cors/Grafts Angiography;  Surgeon: Troy Sine, MD;  Location: [facility];  Service: Cardiovascular;  Laterality: N/A;   LEFT HEART CATH AND CORS/GRAFTS ANGIOGRAPHY N/A 06/22/2019   Procedure: LEFT HEART CATH AND CORS/GRAFTS ANGIOGRAPHY;  Surgeon: Belva Crome, MD;  Location: [facility];  Service: Cardiovascular;  Laterality: N/A;   LEFT HEART CATHETERIZATION WITH CORONARY/GRAFT ANGIOGRAM N/A 07/28/2012   Procedure: LEFT HEART CATHETERIZATION WITH Beatrix Fetters;  Surgeon: Burnell Blanks, MD;  Location: [facility] CATH LAB;  Service: Cardiovascular;  Laterality: N/A;   PERCUTANEOUS CORONARY STENT INTERVENTION (PCI-S)  07/28/2012   Procedure: PERCUTANEOUS CORONARY STENT INTERVENTION (PCI-S);  Surgeon: Burnell Blanks, MD;  Location: [facility] CATH LAB;  Service: Cardiovascular;;   TONSILLECTOMY AND ADENOIDECTOMY  ~  1951    Allergies  Allergen Reactions   Betadine [Povidone Iodine] Shortness Of Breath and Swelling   Codeine Anaphylaxis and Shortness Of Breath    "quit  breathing" (07/28/2012) Tolerates 1-2 shots of morphine   Demerol Anaphylaxis    "quit breathing" (07/28/2012)   Iohexol Anaphylaxis    Finger/ankle swelling   Latex Anaphylaxis    "quit breathing" (07/28/2012)   Other Anaphylaxis    Perfume, Any Fragrance. Cleaning Fluids.  "quit breathing" (07/28/2012)   Percocet [Oxycodone-Acetaminophen] Anaphylaxis    "quit breathing; disoriented" (07/28/2012)   Plavix [Clopidogrel Bisulfate] Anaphylaxis    "get hot; like I'm burning up inside; had to put me in shower after OHS because of that" (07/28/2012)   Red Dye Anaphylaxis    "quit breathing" (07/28/2012)   Shellfish Allergy Shortness Of Breath    "broke out in knots all over" (07/28/2012)   Sulfonamide Derivatives Shortness Of Breath and Rash    "quit breathing" (07/28/2012)   Tylenol [Acetaminophen] Shortness Of Breath and Itching   Azilsartan     Other reaction(s): muscle pain   Azithromycin     Other reaction(s): she cannot take it. felt like she would die. legs would not work , painful and had to get in the shower and had diarrhea.   Barbiturates     Other reaction(s): SOB   Cardizem [Diltiazem]     Other reaction(s): took one dose.  felt very sick, bad. said raised bp more.   Ciprofloxacin     Other reaction(s): after one does had charlie horses all over her legs and could not go back to sleep   Coumadin [Warfarin]     Other reaction(s): CAN'T TAKE, GETS HOT/BREAKS OUT   Crestor [Rosuvastatin]     Other reaction(s): RASH   Ezetimibe     Other reaction(s): RASH   Fluogen [Influenza Virus Vaccine]     Other reaction(s): gets very sick and thinks it is the mercury level.   Furosemide     Other reaction(s): makes her too tired.   Hydrochlorothiazide     Other reaction(s): broke out with blisters on her mouth   Omega-3-Acid Ethyl Esters     Other reaction(s): bad diarrhea   Pravastatin Sodium Other (See Comments)    Leg pain, problems ambulating    Spironolactone     Other  reaction(s): states she doesn't want to take it as it made her dehydrated in the past.   Statins     Muscle pain   Sulfa Antibiotics     Other reaction(s): swelling/SOB   Vancomycin     Other reaction(s): pt states it caused her major abd pain   Imdur [Isosorbide Nitrate] Other (See Comments)    Intolerance. "Can't remember the reaction."    Current Outpatient Medications  Medication Sig Dispense Refill   aspirin EC 81 MG tablet Take 81 mg by mouth daily.     BD PEN NEEDLE NANO 2ND GEN 32G X 4 MM MISC USE THREE TIMES DAILY AS DIRECTED WITH HUMALOG AND TOUJEO     calcium carbonate (TUMS - DOSED IN MG ELEMENTAL CALCIUM) 500 MG chewable tablet Chew 1-2 tablets by mouth 3 (three) times daily as needed for heartburn.      cholecalciferol (VITAMIN D) 1000 UNITS tablet Take 1,000 Units by mouth daily.     CRANBERRY-VITAMIN C PO Take 1 tablet by mouth daily.     doxycycline (VIBRA-TABS) 100 MG tablet Take 100 mg by mouth 2 (two) times daily.  EPINEPHrine 0.3 mg/0.3 mL IJ SOAJ injection Inject 0.3 mg into the muscle as needed.     HUMALOG KWIKPEN 100 UNIT/ML KwikPen Inject 5 Units into the skin in the morning and at bedtime.     Insulin Glargine (TOUJEO MAX SOLOSTAR [place]) Inject 50 Units into the skin daily.      irbesartan (AVAPRO) 150 MG tablet Take by mouth.     Lancets (ONETOUCH DELICA PLUS [id number]) MISC USE TO CHECK BLOOD SUGAR FOUR TIMES DAILY     Magnesium 250 MG TABS Take 250 mg by mouth daily.     metoprolol tartrate (LOPRESSOR) 100 MG tablet TAKE 1 TABLET(100 MG) BY MOUTH TWICE DAILY (Patient taking differently: Take 100 mg by mouth 2 (two) times daily.) 180 tablet 1   nitrofurantoin, macrocrystal-monohydrate, (MACROBID) 100 MG capsule Take 100 mg by mouth daily.     nitroGLYCERIN (NITROSTAT) 0.4 MG SL tablet Place 1 tablet (0.4 mg total) under the tongue every 5 (five) minutes as needed for chest pain (up to 3 doses). 25 tablet 2   NOVOLOG 100 UNIT/ML injection Inject into the skin.      ONETOUCH VERIO test strip      PRALUENT 75 MG/ML SOAJ ADMINISTER 1 ML UNDER THE SKIN EVERY 14 DAYS (Patient taking differently: Inject 1 mL into the skin See admin instructions. Every 16 days) 2 mL 11   prasugrel (EFFIENT) 10 MG TABS tablet Take 1 tablet (10 mg total) by mouth daily. 90 tablet 3   Probiotic Product (PROBIOTIC-10 PO) Take 1 capsule by mouth daily.     valsartan (DIOVAN) 160 MG tablet TAKE 1 TABLET(160 MG) BY MOUTH TWICE DAILY (Patient taking differently: Take 160 mg by mouth 2 (two) times daily.) 180 tablet 3   vitamin B-12 (CYANOCOBALAMIN) 1000 MCG tablet Take 1,000 mcg by mouth daily.     amLODipine (NORVASC) 2.5 MG tablet Take 1 tablet (2.5 mg total) by mouth every evening. 90 tablet 3   amLODipine (NORVASC) 5 MG tablet Take 5 mg in the morning. 90 tablet 3   No current facility-administered medications for this visit.    Social History   Socioeconomic History   Marital status: Widowed    Spouse name: Not on file   Number of children: 2   Years of education: Not on file   Highest education level: Not on file  Occupational History   Occupation: retired  Tobacco Use   Smoking status: Never   Smokeless tobacco: Never  Vaping Use   Vaping Use: Never used  Substance and Sexual Activity   Alcohol use: No    Alcohol/week: 0.0 standard drinks   Drug use: No   Sexual activity: Never  Other Topics Concern   Not on file  Social History Narrative   Not on file   Social Determinants of Health   Financial Resource Strain: Not on file  Food Insecurity: Not on file  Transportation Needs: Not on file  Physical Activity: Not on file  Stress: Not on file  Social Connections: Not on file  Intimate Partner Violence: Not on file   Social history is notable that she is widowed.  She has 2 children who are 5 years apart in their 34s there is no tobacco use.  She does not routinely exercise. She has a son who had undergone CABG surgery and reportedly has a defibrillator  and an ejection fraction of 35%.  Family History  Problem Relation Age of Onset   Coronary artery disease Father  Colon cancer Father    Colon polyps Father    Heart disease Father    Stroke Mother    Hypertension Other    Breast cancer Other        grandmother   Diabetes Maternal Grandmother    Diabetes Paternal Grandmother    Esophageal cancer Neg Hx    Rectal cancer Neg Hx    Stomach cancer Neg Hx    Family history is notable in that her father has heart disease and is 51 years old.  Her mother died at age 10 with a stroke.  She has 2 sisters, ages 26 and 90.  ROS General: Negative; No fevers, chills, or night sweats HEENT: Negative; No changes in vision or hearing, sinus congestion, difficulty swallowing Pulmonary: Negative; No cough, wheezing, shortness of breath, hemoptysis Cardiovascular: See HPI:  GI: Positive for left upper quadrant pain which he describes as "gas."  She has issues with diverticulitis intermittently and irritable bowel syndrome GU: Has had some urinary issues and will be undergoing a cystoscopy Musculoskeletal: Negative; no myalgias, joint pain, or weakness Hematologic: Negative; no easy bruising, bleeding Endocrine: Positive for diabetes mellitus , which is untreated.  She has refused medications Neuro: Negative; no changes in balance, headaches Skin: Negative; No rashes or skin lesions Psychiatric: Anxiety Sleep: Negative; No snoring,  daytime sleepiness, hypersomnolence, bruxism, restless legs, hypnogognic hallucinations. Other comprehensive 14 point system review is negative   Physical Exam BP (!) 144/76 (BP Location: Left Arm, Patient Position: Sitting, Cuff Size: Large)   Pulse 64   Ht 5' 1.5" (1.562 m)   Wt 185 lb (83.9 kg)   BMI 34.39 kg/m    Repeat blood pressure by me was stable at 124/74.  Wt Readings from Last 3 Encounters:  07/30/21 185 lb (83.9 kg)  07/23/21 190 lb 4 oz (86.3 kg)  01/29/21 187 lb 6.4 oz (85 kg)   General:  Alert, oriented, no distress.  Skin: normal turgor, no rashes, warm and dry HEENT: Normocephalic, atraumatic. Pupils equal round and reactive to light; sclera anicteric; extraocular muscles intact; Fundi ** Nose without nasal septal hypertrophy Mouth/Parynx benign; Mallinpatti scale 3 Neck: No JVD, no carotid bruits; normal carotid upstroke Lungs: clear to ausculatation and percussion; no wheezing or rales Chest wall: without tenderness to palpitation Heart: PMI not displaced, RRR, s1 s2 normal, 1/6 systolic murmur, no diastolic murmur, no rubs, gallops, thrills, or heaves Abdomen: soft, nontender; no hepatosplenomehaly, BS+; abdominal aorta nontender and not dilated by palpation. Back: no CVA tenderness Pulses 2+ Musculoskeletal: tenderness over the right trapezius region and posterior neck; normal strength, no joint deformities Extremities: no clubbing cyanosis or edema, Homan's sign negative  Neurologic: grossly nonfocal; Cranial nerves grossly wnl Psychologic: Normal mood and affect   July 30, 2021 ECG (independently read by me):  NSR at 64; NSST changes; QTc 464 msec  Jan 29, 2021 ECG (independently read by me): NSR at 65; nonspecific ST changes  November 17, 2021ECG (independently read by me): Normal sinus rhythm at 63 bpm.  Normal intervals.  Mild T wave abnormality in aVL  October 2020 ECG (independently read by me): Normal sinus rhythm 64 bpm.  Nonspecific ST changes.   February 2020 ECG (independently read by me): NSR ay 68; nonspecific lateral T wave abnormality.  Normal intervals.  No ectopy.  May 2019 ECG (independently read by me): Normal sinus rhythm at 66 bpm.  Lateral T wave abnormality in leads I and aVL.  Normal intervals.  October 2018 ECG (  independently read by me): Normal sinus rhythm at 66 bpm.  Nondiagnostic T change laterally.  Normal intervals.  No ectopy.  April 2018 ECG (independently read by me): Normal sinus rhythm at 67 bpm.  PR interval 142 ms.   Poor anterior R-wave progression.  Lateral T changes in 1 and L.  December 2017 ECG (independently read by me): Normal sinus rhythm at 70 bpm.  Nonspecific ST changes.  No ectopy.  March 2017 ECG (independently read by me):  Normal sinus rhythm at 70 bpm. Nonspecific ST changes laterally.  03/04/2015 ECG (independently read by me): Normal sinus rhythm at 65 bpm.  Mild T wave abnormality in lead 1 and aVL.  LABS:  BMP Latest Ref Rng & Units 07/23/2021 12/26/2020 12/25/2020  Glucose 70 - 99 mg/dL 234(H) 157(H) 178(H)  BUN 6 - 23 mg/dL 15 20 14   Creatinine 0.40 - 1.20 mg/dL 0.72 0.81 0.83  BUN/Creat Ratio 12 - 28 - - -  Sodium 135 - 145 mEq/L 135 136 138  Potassium 3.5 - 5.1 mEq/L 3.7 3.8 4.6  Chloride 96 - 112 mEq/L 97 102 103  CO2 19 - 32 mEq/L 27 26 25   Calcium 8.4 - 10.5 mg/dL 9.7 9.4 9.3     Hepatic Function Latest Ref Rng & Units 07/23/2021 12/26/2020 12/24/2020  Total Protein 6.0 - 8.3 g/dL 7.4 6.0(L) 6.8  Albumin 3.5 - 5.2 g/dL 4.4 3.3(L) 3.7  AST 0 - 37 U/L 12 16 18   ALT 0 - 35 U/L 10 14 13   Alk Phosphatase 39 - 117 U/L 76 58 60  Total Bilirubin 0.2 - 1.2 mg/dL 0.6 0.8 0.8  Bilirubin, Direct 0.0 - 0.2 mg/dL - - -    CBC Latest Ref Rng & Units 07/23/2021 12/25/2020 12/24/2020  WBC 4.0 - 10.5 K/uL 8.2 8.2 6.2  Hemoglobin 12.0 - 15.0 g/dL 14.5 14.6 13.0  Hematocrit 36.0 - 46.0 % 42.8 44.2 39.9  Platelets 150.0 - 400.0 K/uL 289.0 265 201   Lab Results  Component Value Date   MCV 90.1 07/23/2021   MCV 91.5 12/25/2020   MCV 92.6 12/24/2020    Lab Results  Component Value Date   TSH 1.987 03/19/2015    BNP No results found for: BNP  ProBNP No results found for: PROBNP   Lipid Panel     Component Value Date/Time   CHOL 300 (H) 06/21/2019 1855   TRIG 183 (H) 06/21/2019 1855   HDL 39 (L) 06/21/2019 1855   CHOLHDL 7.7 06/21/2019 1855   VLDL 37 06/21/2019 1855   LDLCALC 224 (H) 06/21/2019 1855   LDLDIRECT 160.9 06/21/2012 0833     RADIOLOGY: CT ABDOMEN  PELVIS WO CONTRAST  Result Date: 07/28/2021 CLINICAL DATA:  Severe left lower quadrant pain for 2 days which radiates across to the right lower quadrant with bloating for 2 weeks. Severe back pain since last evening. Assess diverticulitis. EXAM: CT ABDOMEN AND PELVIS WITHOUT CONTRAST TECHNIQUE: Multidetector CT imaging of the abdomen and pelvis was performed following the standard protocol without IV contrast. COMPARISON:  CT abdomen-pelvis 11/28/2020; U/S renal 07/26/2020. FINDINGS: Lower chest: Medial left lower lobe 0.3 cm indistinct pulmonary nodule (series 3/image 7), stable since 02/12/2014 CT, considered benign. Scattered small parenchymal bands at both lung bases, unchanged, compatible with nonspecific postinfectious/postinflammatory scarring. No acute abnormality at the lung bases. Coronary atherosclerosis. Hepatobiliary: Normal liver size. No liver mass. Normal gallbladder with no radiopaque cholelithiasis. No biliary ductal dilatation. Pancreas: Normal, with no mass or duct dilation. Spleen: Normal  size. No mass. Adrenals/Urinary Tract: Stable adrenals, without discrete adrenal nodules. No hydronephrosis. No renal stones. No contour deforming renal masses. Normal bladder. Stomach/Bowel: Normal non-distended stomach. Normal caliber small bowel with no small bowel wall thickening. Normal appendix. Marked diffuse colonic diverticulosis, most prominent in the sigmoid colon, with no large bowel wall thickening or significant pericolonic fat stranding. Vascular/Lymphatic: Atherosclerotic nonaneurysmal abdominal aorta. No pathologically enlarged lymph nodes in the abdomen or pelvis. Reproductive: Status post hysterectomy, with no abnormal findings at the vaginal cuff. No adnexal mass. Other: No pneumoperitoneum, ascites or focal fluid collection. Musculoskeletal: No aggressive appearing focal osseous lesions. Mild thoracolumbar spondylosis. IMPRESSION: 1. No acute abnormality. No evidence of bowel obstruction  or acute bowel inflammation. Marked diffuse colonic diverticulosis, with no evidence of acute diverticulitis. 2. Coronary atherosclerosis. 3. Aortic Atherosclerosis (ICD10-I70.0). Electronically Signed   By: Ilona Sorrel M.D.   On: 07/28/2021 08:49    IMPRESSION:  1. Coronary artery disease involving native coronary artery of native heart without angina pectoris   2. Hyperlipidemia with target LDL less than 70   3. Essential hypertension   4. Statin intolerance   5. Cervical arthritis     ASSESSMENT AND PLAN: Ms. Charliene Inoue is a 75 year old female who is 16 years status post CABG revascularization surgery 4  at the [facility] in [place], [place] in 2006.  She is status post remote DES stenting to the vein graft supplying her RCA territory.  She developed recurrent anginal symptomatology leading to repeat cardiac catheterization on 03/01/2017. 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  She has had issues with blood pressure lability.  She has not had any recurrent anginal symptomatology.  Most recently she is bothered by cervical arthritis involving her neck.  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  There also was active inflammation on the right at C2-3 and C3-4 there was concern for possible left radiculopathy.  When I saw her at her last evaluation I recommended she change the way she took her medications and apparently at that time she was taking medications every 2 hours throughout the day.  She has always been hesitant to make certain changes and has many drug allergies.  Her blood pressure today, however is actually stable on her current regimen of amlodipine 7.5 mg which she now takes all at once in the morning and  metoprolol tartrate 100 mg twice a day.  She is on ARB therapy and is hard to discern if she was taking irbesartan or valsartan.  Currently she had not taken Praluent and recommended strongly resumption of treatment.  She will follow-up with her primary physician and is undergoing orthopedic outpatient rehabilitation with microlobulated, PT.  I will see her in 6 months for cardiology evaluation.   Troy Sine, MD, [facility]  08/06/2021 3:52 PM

## 2021-08-06 ENCOUNTER — Other Ambulatory Visit: Payer: Self-pay

## 2021-08-06 ENCOUNTER — Ambulatory Visit: Payer: Medicare Other | Admitting: Physical Therapy

## 2021-08-06 ENCOUNTER — Encounter: Payer: Self-pay | Admitting: Physical Therapy

## 2021-08-06 DIAGNOSIS — R252 Cramp and spasm: Secondary | ICD-10-CM

## 2021-08-06 DIAGNOSIS — M542 Cervicalgia: Secondary | ICD-10-CM

## 2021-08-06 DIAGNOSIS — M5412 Radiculopathy, cervical region: Secondary | ICD-10-CM

## 2021-08-06 NOTE — Therapy (Signed)
[place]. [place], [place], [address] Phone: [phone]   Fax:  [phone]  Physical Therapy Treatment  Patient Details  Name: Ruth [name] MRN: [id number] Date of Birth: 1946-03-29 Referring Provider (PT): Perini   Encounter Date: 08/06/2021   PT End of Session - 08/06/21 0910     Visit Number 4    Date for PT Re-Evaluation 10/07/21    Authorization Type UHC Medicare    PT Start Time 0840    PT Stop Time 0930    PT Time Calculation (min) 50 min    Activity Tolerance Patient tolerated treatment well;Patient limited by pain             Past Medical History:  Diagnosis Date   Anxiety    CAD (coronary artery disease)    a. CABG x 4 in 2006 in [place] (LIMA->LAD, VG->Diag, VG->OM, VG->RCA); b. DES to midbody of SVG-PDA/PLA 07/2012; c. 03/2015 Myoview: EF 65%, small defect of mild severit in basal inferolateral wall, low risk.   Complication of anesthesia    "quit breathing when I got ?Inovar" (07/28/2012)   Depression    Diverticulitis    Diverticulosis    Dyslipidemia    Intolerant of statins   Fecal incontinence    GERD (gastroesophageal reflux disease)    Hyperlipidemia    Hyperplastic colon polyp    IBS (irritable bowel syndrome)    Labile hypertension    a. 09/2016 Renal Artery duplex: nl renal arteries.   Migraines    "had them in my 15's" (07/28/2012)   Multiple allergies    Obesity    Steatohepatitis    Type II diabetes mellitus ([place])    a. no meds, diet controlled - takes cinnamon.    Past Surgical History:  Procedure Laterality Date   ABDOMINAL HYSTERECTOMY  1970's   BREAST LUMPECTOMY     bilateral   CARDIAC CATHETERIZATION  2006   CORONARY ANGIOPLASTY WITH STENT PLACEMENT  07/28/2012   "1; first time for me" (07/28/2012)   CORONARY ARTERY BYPASS GRAFT  2006   CABG X4   CORONARY STENT INTERVENTION N/A 03/01/2017   Procedure: Coronary Stent Intervention;  Surgeon:  Troy Sine, MD;  Location: [place] [facility];  Service: Cardiovascular;  Laterality: N/A;   CORONARY STENT INTERVENTION N/A 06/23/2019   Procedure: CORONARY STENT INTERVENTION;  Surgeon: Martinique, Peter M, MD;  Location: [place] [facility];  Service: Cardiovascular;  Laterality: N/A;   EXCISIONAL HEMORRHOIDECTOMY  1970's   INGUINAL HERNIA REPAIR  1970's?   left   LEFT HEART CATH AND CORS/GRAFTS ANGIOGRAPHY N/A 03/01/2017   Procedure: Left Heart Cath and Cors/Grafts Angiography;  Surgeon: Troy Sine, MD;  Location: [place] [facility];  Service: Cardiovascular;  Laterality: N/A;   LEFT HEART CATH AND CORS/GRAFTS ANGIOGRAPHY N/A 06/22/2019   Procedure: LEFT HEART CATH AND CORS/GRAFTS ANGIOGRAPHY;  Surgeon: Belva Crome, MD;  Location: [place] [facility];  Service: Cardiovascular;  Laterality: N/A;   LEFT HEART CATHETERIZATION WITH CORONARY/GRAFT ANGIOGRAM N/A 07/28/2012   Procedure: LEFT HEART CATHETERIZATION WITH Beatrix Fetters;  Surgeon: Burnell Blanks, MD;  Location: [facility] CATH LAB;  Service: Cardiovascular;  Laterality: N/A;   PERCUTANEOUS CORONARY STENT INTERVENTION (PCI-S)  07/28/2012   Procedure: PERCUTANEOUS CORONARY STENT INTERVENTION (PCI-S);  Surgeon: Burnell Blanks, MD;  Location: [facility] CATH LAB;  Service: Cardiovascular;;   TONSILLECTOMY AND ADENOIDECTOMY  ~ 1951    There were no vitals  filed for this visit.   Subjective Assessment - 08/06/21 0846     Subjective I really think I am starting to feel better.    Currently in Pain? Yes    Pain Score 4     Pain Location Neck    Pain Orientation Right    Pain Descriptors / Indicators Constant    Pain Relieving Factors I really think that this is helping                               [facility] Adult PT Treatment/Exercise - 08/06/21 0001       Neck Exercises: Theraband   Shoulder Extension 15 reps    Shoulder Extension Limitations yellow    Rows 15 reps    Rows Limitations yellow     Shoulder External Rotation 15 reps    Shoulder External Rotation Limitations yellow      Neck Exercises: Seated   Neck Retraction 10 reps    Shoulder Shrugs 15 reps    Shoulder Rolls 10 reps    Other Seated Exercise thigh slides    Other Seated Exercise biceps with 1# stick, then flexion up to 90 degrees      Moist Heat Therapy   Number Minutes Moist Heat 10 Minutes    Moist Heat Location Cervical      Electrical Stimulation   Electrical Stimulation Location right upper trap andneck area into the right upper arm    Electrical Stimulation Action IFC    Electrical Stimulation Parameters sitting    Electrical Stimulation Goals Pain      Manual Therapy   Manual Therapy Manual Traction    Soft tissue mobilization to the right cervical area, right upper trap and right upper arm    Passive ROM to the right shoulder    Manual Traction cervical with her sitting                       PT Short Term Goals - 08/06/21 0912       PT SHORT TERM GOAL #1   Title pt will be I with inital HEP    Status Partially Met               PT Long Term Goals - 08/06/21 0913       PT LONG TERM GOAL #1   Title pt will be I with all advanced HEP    Status On-going      PT LONG TERM GOAL #2   Title decrease pain overall 50%    Status On-going      PT LONG TERM GOAL #3   Title report 50% less pain with driving    Status On-going      PT LONG TERM GOAL #4   Title increase cervical ROM 25%    Status On-going                   Plan - 08/06/21 0911     Clinical Impression Statement Patient reports that she is having some less pain and she feels that she is moving a little better, she seems to be more open to movements and exercises today.  She still has a lot of tension and knots in the upper trap, neck and rhomboid and upper arm areas.  She needs cues for the motions to not over do it and for posture    PT Next Visit  Plan continue to add exercises as tolerated  manual traction in sitting as she does not lie down due to heart issues    Consulted and Agree with Plan of Care Patient             Patient will benefit from skilled therapeutic intervention in order to improve the following deficits and impairments:  Decreased range of motion, Increased muscle spasms, Pain, Improper body mechanics, Decreased strength, Decreased mobility, Postural dysfunction  Visit Diagnosis: Cervicalgia  Cramp and spasm  Radiculopathy, cervical region     Problem List Patient Active Problem List   Diagnosis Date Noted   Dizziness 12/24/2020   Multiple drug allergies 12/24/2020   Dysuria 01/17/2020   Non-ST elevation (NSTEMI) myocardial infarction [facility])    Chest pain 06/21/2019   IBS (irritable bowel syndrome) 06/21/2019   Hyperbilirubinemia 06/21/2019   GERD (gastroesophageal reflux disease) 06/21/2019   Low back pain 05/11/2019   Atopic dermatitis 04/03/2019   Other specified health status 08/10/2018   Encounter for general adult medical examination without abnormal findings 06/15/2018   Noninflammatory disorder of vagina 06/13/2018   Arthralgia of right temporomandibular joint 10/28/2017   Bilateral impacted cerumen 10/28/2017   Chronic otitis externa of left ear 10/28/2017   Frequency of micturition 09/28/2017   Benign neoplasm of colon 08/24/2017   Spinal stenosis of lumbar region 08/24/2017   Fall 04/27/2017   Abnormal nuclear stress test    Other stressful life events affecting family and household 01/26/2017   Diverticula of intestine 09/28/2016   Eructation 09/28/2016   Tinea unguium 06/24/2016   Neck pain 03/12/2016   Hypertensive heart disease 05/25/2015   Hyperglycemia due to type 2 diabetes mellitus ([place]) 03/22/2015   Pure hypercholesterolemia 03/22/2015   Type II or unspecified type diabetes mellitus without mention of complication, uncontrolled 02/14/2014   Hyponatremia 02/13/2014   HTN (hypertension) 02/13/2014   Sepsis  secondary to UTI ([place]) 02/12/2014   Acute pyelonephritis 02/12/2014   Pain in left leg 11/13/2013   Candidiasis 09/12/2013   Impingement syndrome of left shoulder region 02/14/2013   Spasm 12/14/2012   Unstable angina (HCC) 07/29/2012   Obesity (BMI 30-39.9) 07/29/2012   Chronic ulcer of skin ([place]) 06/16/2012   Hemorrhoids 06/16/2012   Bloating 06/15/2012   Generalized abdominal pain 06/15/2012   Family history of colon cancer 06/15/2012   Breast lump 01/11/2012   Microscopic hematuria 01/11/2012   Myalgia 11/24/2011   Malignant HTN with heart disease, w/o CHF, 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 12/30/2010   Coronary artery disease 12/30/2010   Hyperlipidemia with target LDL less than 70 12/30/2010   Diabetes mellitus ([place]) 12/30/2010   Enthesopathy of hip region 11/29/2009   RECTAL INCONTINENCE 06/13/2008    Sumner Boast, PT 08/06/2021, 9:14 AM  [address] [place], [place], [address] Phone: [phone]   Fax:  [phone]  Name: THERASA LORENZI MRN: [id number] Date of Birth: 02-07-1946

## 2021-08-11 ENCOUNTER — Other Ambulatory Visit: Payer: Self-pay

## 2021-08-11 ENCOUNTER — Ambulatory Visit: Payer: Medicare Other | Attending: Neurological Surgery | Admitting: Physical Therapy

## 2021-08-11 ENCOUNTER — Encounter: Payer: Self-pay | Admitting: Physical Therapy

## 2021-08-11 DIAGNOSIS — M542 Cervicalgia: Secondary | ICD-10-CM | POA: Insufficient documentation

## 2021-08-11 DIAGNOSIS — M5412 Radiculopathy, cervical region: Secondary | ICD-10-CM | POA: Insufficient documentation

## 2021-08-11 DIAGNOSIS — R252 Cramp and spasm: Secondary | ICD-10-CM | POA: Insufficient documentation

## 2021-08-11 NOTE — Therapy (Signed)
[place] [place], [place], [address] Phone: [phone]   Fax:  [phone]  Physical Therapy Treatment  Patient Details  Name: Ruth [name] MRN: [id number] Date of Birth: September 01, 1946 Referring Provider (PT): Perini   Encounter Date: 08/11/2021   PT End of Session - 08/11/21 0900     Visit Number 5    Date for PT Re-Evaluation 10/07/21    Authorization Type UHC Medicare    PT Start Time 0827    PT Stop Time 0922    PT Time Calculation (min) 55 min    Activity Tolerance Patient tolerated treatment well;Patient limited by pain    Behavior During Therapy [facility] for tasks assessed/performed             Past Medical History:  Diagnosis Date   Anxiety    CAD (coronary artery disease)    a. CABG x 4 in 2006 in [place] (LIMA->LAD, VG->Diag, VG->OM, VG->RCA); b. DES to midbody of SVG-PDA/PLA 07/2012; c. 03/2015 Myoview: EF 65%, small defect of mild severit in basal inferolateral wall, low risk.   Complication of anesthesia    "quit breathing when I got ?Inovar" (07/28/2012)   Depression    Diverticulitis    Diverticulosis    Dyslipidemia    Intolerant of statins   Fecal incontinence    GERD (gastroesophageal reflux disease)    Hyperlipidemia    Hyperplastic colon polyp    IBS (irritable bowel syndrome)    Labile hypertension    a. 09/2016 Renal Artery duplex: nl renal arteries.   Migraines    "had them in my 39's" (07/28/2012)   Multiple allergies    Obesity    Steatohepatitis    Type II diabetes mellitus ([place])    a. no meds, diet controlled - takes cinnamon.    Past Surgical History:  Procedure Laterality Date   ABDOMINAL HYSTERECTOMY  1970's   BREAST LUMPECTOMY     bilateral   CARDIAC CATHETERIZATION  2006   CORONARY ANGIOPLASTY WITH STENT PLACEMENT  07/28/2012   "1; first time for me" (07/28/2012)   CORONARY ARTERY BYPASS GRAFT  2006   CABG X4   CORONARY STENT INTERVENTION N/A  03/01/2017   Procedure: Coronary Stent Intervention;  Surgeon: Troy Sine, MD;  Location: [place] [facility];  Service: Cardiovascular;  Laterality: N/A;   CORONARY STENT INTERVENTION N/A 06/23/2019   Procedure: CORONARY STENT INTERVENTION;  Surgeon: Martinique, Peter M, MD;  Location: [place] [facility];  Service: Cardiovascular;  Laterality: N/A;   EXCISIONAL HEMORRHOIDECTOMY  1970's   INGUINAL HERNIA REPAIR  1970's?   left   LEFT HEART CATH AND CORS/GRAFTS ANGIOGRAPHY N/A 03/01/2017   Procedure: Left Heart Cath and Cors/Grafts Angiography;  Surgeon: Troy Sine, MD;  Location: [place] [facility];  Service: Cardiovascular;  Laterality: N/A;   LEFT HEART CATH AND CORS/GRAFTS ANGIOGRAPHY N/A 06/22/2019   Procedure: LEFT HEART CATH AND CORS/GRAFTS ANGIOGRAPHY;  Surgeon: Belva Crome, MD;  Location: [place] [facility];  Service: Cardiovascular;  Laterality: N/A;   LEFT HEART CATHETERIZATION WITH CORONARY/GRAFT ANGIOGRAM N/A 07/28/2012   Procedure: LEFT HEART CATHETERIZATION WITH Beatrix Fetters;  Surgeon: Burnell Blanks, MD;  Location[facility] C[facility] LAB;  Service: Cardiovascular;  Laterality: N/A;   PERCUTANEOUS CORONARY STENT INTERVENTION (PCI-S)  07/28/2012   Procedure: PERCUTANEOUS CORONARY STENT INTERVENTION (PCI-S);  Surgeon: Burnell Blanks, MD;  Location: [facility] CATH LAB;  Service: Cardiovascular;;   TONSILLECTOMY AND ADENOIDECTOMY  ~  1951    There were no vitals filed for this visit.   Subjective Assessment - 08/11/21 0830     Subjective Patient comes in very upset, her dog died early this morning, she is tearful, reports increased neck and shoulkder pain    Currently in Pain? Yes    Pain Score 6     Pain Location Neck    Pain Orientation Right    Aggravating Factors  stress                               [facility] Adult PT Treatment/Exercise - 08/11/21 0001       Neck Exercises: Theraband   Shoulder Extension 15 reps    Shoulder Extension  Limitations yellow    Rows 15 reps    Rows Limitations yellow      Neck Exercises: Seated   Neck Retraction 10 reps    Shoulder Shrugs 15 reps    Shoulder Rolls 10 reps    Other Seated Exercise thigh slides, hitchhikers    Other Seated Exercise biceps with 1# stick, then flexion up to 90 degrees      Moist Heat Therapy   Number Minutes Moist Heat 10 Minutes    Moist Heat Location Cervical      Electrical Stimulation   Electrical Stimulation Location right upper trap andneck area into the right upper arm    Electrical Stimulation Action IFC    Electrical Stimulation Parameters sitting    Electrical Stimulation Goals Pain      Manual Therapy   Manual Therapy Manual Traction    Soft tissue mobilization to the right cervical area, right upper trap and right upper arm    Passive ROM to the right shoulder    Manual Traction cervical with her sitting                       PT Short Term Goals - 08/11/21 0905       PT SHORT TERM GOAL #1   Title pt will be I with inital HEP    Status Achieved               PT Long Term Goals - 08/06/21 0913       PT LONG TERM GOAL #1   Title pt will be I with all advanced HEP    Status On-going      PT LONG TERM GOAL #2   Title decrease pain overall 50%    Status On-going      PT LONG TERM GOAL #3   Title report 50% less pain with driving    Status On-going      PT LONG TERM GOAL #4   Title increase cervical ROM 25%    Status On-going                   Plan - 08/11/21 0903     Clinical Impression Statement Patient comes in tearful and reports increased stress.  Her dog died this morning.  She reports increased neck pain.  She does have a lot of knots and tenderness in the right upper trap and neck area.  She reports that the [facility] and the traction does feel good, reports that she is usually sore after but feels good after.    PT Next Visit Plan will continue to try to progress her strength and function, she  does report less  difficulty doing hair now    Consulted and Agree with Plan of Care Patient             Patient will benefit from skilled therapeutic intervention in order to improve the following deficits and impairments:  Decreased range of motion, Increased muscle spasms, Pain, Improper body mechanics, Decreased strength, Decreased mobility, Postural dysfunction  Visit Diagnosis: Cervicalgia  Cramp and spasm  Radiculopathy, cervical region     Problem List Patient Active Problem List   Diagnosis Date Noted   Dizziness 12/24/2020   Multiple drug allergies 12/24/2020   Dysuria 01/17/2020   Non-ST elevation (NSTEMI) myocardial infarction [facility])    Chest pain 06/21/2019   IBS (irritable bowel syndrome) 06/21/2019   Hyperbilirubinemia 06/21/2019   GERD (gastroesophageal reflux disease) 06/21/2019   Low back pain 05/11/2019   Atopic dermatitis 04/03/2019   Other specified health status 08/10/2018   Encounter for general adult medical examination without abnormal findings 06/15/2018   Noninflammatory disorder of vagina 06/13/2018   Arthralgia of right temporomandibular joint 10/28/2017   Bilateral impacted cerumen 10/28/2017   Chronic otitis externa of left ear 10/28/2017   Frequency of micturition 09/28/2017   Benign neoplasm of colon 08/24/2017   Spinal stenosis of lumbar region 08/24/2017   Fall 04/27/2017   Abnormal nuclear stress test    Other stressful life events affecting family and household 01/26/2017   Diverticula of intestine 09/28/2016   Eructation 09/28/2016   Tinea unguium 06/24/2016   Neck pain 03/12/2016   Hypertensive heart disease 05/25/2015   Hyperglycemia due to type 2 diabetes mellitus ([place]) 03/22/2015   Pure hypercholesterolemia 03/22/2015   Type II or unspecified type diabetes mellitus without mention of complication, uncontrolled 02/14/2014   Hyponatremia 02/13/2014   HTN (hypertension) 02/13/2014   Sepsis secondary to UTI ([place]) 02/12/2014    Acute pyelonephritis 02/12/2014   Pain in left leg 11/13/2013   Candidiasis 09/12/2013   Impingement syndrome of left shoulder region 02/14/2013   Spasm 12/14/2012   Unstable angina (HCC) 07/29/2012   Obesity (BMI 30-39.9) 07/29/2012   Chronic ulcer of skin ([place]) 06/16/2012   Hemorrhoids 06/16/2012   Bloating 06/15/2012   Generalized abdominal pain 06/15/2012   Family history of colon cancer 06/15/2012   Breast lump 01/11/2012   Microscopic hematuria 01/11/2012   Myalgia 11/24/2011   Malignant HTN with heart disease, w/o CHF, 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 12/30/2010   Coronary artery disease 12/30/2010   Hyperlipidemia with target LDL less than 70 12/30/2010   Diabetes mellitus ([place]) 12/30/2010   Enthesopathy of hip region 11/29/2009   RECTAL INCONTINENCE 06/13/2008    Sumner Boast, PT 08/11/2021, 9:06 AM  [place] [place], [place], [address] Phone: [phone]   Fax:  [phone]  Name: Ruth [name] MRN: [id number] Date of Birth: 04/25/1946

## 2021-08-13 ENCOUNTER — Encounter: Payer: Self-pay | Admitting: Physical Therapy

## 2021-08-13 ENCOUNTER — Other Ambulatory Visit: Payer: Self-pay

## 2021-08-13 ENCOUNTER — Ambulatory Visit: Payer: Medicare Other | Admitting: Physical Therapy

## 2021-08-13 DIAGNOSIS — M542 Cervicalgia: Secondary | ICD-10-CM

## 2021-08-13 DIAGNOSIS — M5412 Radiculopathy, cervical region: Secondary | ICD-10-CM

## 2021-08-13 DIAGNOSIS — R252 Cramp and spasm: Secondary | ICD-10-CM

## 2021-08-13 NOTE — Therapy (Signed)
[place]. [place], [place], [address] Phone: [phone]   Fax:  [phone]  Physical Therapy Treatment  Patient Details  Name: Ruth [name] MRN: [id number] Date of Birth: 01-25-1946 Referring Provider (PT): Perini   Encounter Date: 08/13/2021   PT End of Session - 08/13/21 0903     Visit Number 6    Date for PT Re-Evaluation 10/07/21    Authorization Type UHC Medicare    PT Start Time 0819    PT Stop Time 0915    PT Time Calculation (min) 56 min    Activity Tolerance Patient tolerated treatment well;Patient limited by pain    Behavior During Therapy [facility] for tasks assessed/performed             Past Medical History:  Diagnosis Date   Anxiety    CAD (coronary artery disease)    a. CABG x 4 in 2006 in [place] (LIMA->LAD, VG->Diag, VG->OM, VG->RCA); b. DES to midbody of SVG-PDA/PLA 07/2012; c. 03/2015 Myoview: EF 65%, small defect of mild severit in basal inferolateral wall, low risk.   Complication of anesthesia    "quit breathing when I got ?Inovar" (07/28/2012)   Depression    Diverticulitis    Diverticulosis    Dyslipidemia    Intolerant of statins   Fecal incontinence    GERD (gastroesophageal reflux disease)    Hyperlipidemia    Hyperplastic colon polyp    IBS (irritable bowel syndrome)    Labile hypertension    a. 09/2016 Renal Artery duplex: nl renal arteries.   Migraines    "had them in my 35's" (07/28/2012)   Multiple allergies    Obesity    Steatohepatitis    Type II diabetes mellitus ([place])    a. no meds, diet controlled - takes cinnamon.    Past Surgical History:  Procedure Laterality Date   ABDOMINAL HYSTERECTOMY  1970's   BREAST LUMPECTOMY     bilateral   CARDIAC CATHETERIZATION  2006   CORONARY ANGIOPLASTY WITH STENT PLACEMENT  07/28/2012   "1; first time for me" (07/28/2012)   CORONARY ARTERY BYPASS GRAFT  2006   CABG X4   CORONARY STENT INTERVENTION N/A  03/01/2017   Procedure: Coronary Stent Intervention;  Surgeon: Troy Sine, MD;  Location: [place] [facility];  Service: Cardiovascular;  Laterality: N/A;   CORONARY STENT INTERVENTION N/A 06/23/2019   Procedure: CORONARY STENT INTERVENTION;  Surgeon: Martinique, Peter M, MD;  Location: [place] [facility];  Service: Cardiovascular;  Laterality: N/A;   EXCISIONAL HEMORRHOIDECTOMY  1970's   INGUINAL HERNIA REPAIR  1970's?   left   LEFT HEART CATH AND CORS/GRAFTS ANGIOGRAPHY N/A 03/01/2017   Procedure: Left Heart Cath and Cors/Grafts Angiography;  Surgeon: Troy Sine, MD;  Location: [place] [facility];  Service: Cardiovascular;  Laterality: N/A;   LEFT HEART CATH AND CORS/GRAFTS ANGIOGRAPHY N/A 06/22/2019   Procedure: LEFT HEART CATH AND CORS/GRAFTS ANGIOGRAPHY;  Surgeon: Belva Crome, MD;  Location: [place] [facility];  Service: Cardiovascular;  Laterality: N/A;   LEFT HEART CATHETERIZATION WITH CORONARY/GRAFT ANGIOGRAM N/A 07/28/2012   Procedure: LEFT HEART CATHETERIZATION WITH Beatrix Fetters;  Surgeon: Burnell Blanks, MD;  Location: [facility] CATH LAB;  Service: Cardiovascular;  Laterality: N/A;   PERCUTANEOUS CORONARY STENT INTERVENTION (PCI-S)  07/28/2012   Procedure: PERCUTANEOUS CORONARY STENT INTERVENTION (PCI-S);  Surgeon: Burnell Blanks, MD;  Location: [facility] CATH LAB;  Service: Cardiovascular;;   TONSILLECTOMY AND ADENOIDECTOMY  ~  1951    There were no vitals filed for this visit.   Subjective Assessment - 08/13/21 0827     Subjective Still sad, a little more sore in the neck and the shoulders    Currently in Pain? Yes    Pain Score 5     Pain Location Neck    Pain Orientation Right;Left    Pain Descriptors / Indicators Sore                               [facility] Adult PT Treatment/Exercise - 08/13/21 0001       Neck Exercises: Machines for Strengthening   UBE (Upper Arm Bike) level 1 x 4 minutes      Neck Exercises: Theraband    Shoulder Extension 15 reps    Shoulder Extension Limitations yellow    Rows 15 reps    Rows Limitations yellow    Shoulder External Rotation 20 reps    Shoulder External Rotation Limitations yellow      Neck Exercises: Standing   Wall Wash flexion and CW/CCW circles    Other Standing Exercises W backs    Other Standing Exercises wand exercises      Neck Exercises: Seated   Neck Retraction 10 reps    Shoulder Shrugs 15 reps    Shoulder Rolls 15 reps    Other Seated Exercise biceps with 2# stick, then flexion up to 90 degrees      Moist Heat Therapy   Number Minutes Moist Heat 10 Minutes    Moist Heat Location Cervical      Electrical Stimulation   Electrical Stimulation Location bilateral C/T area    Electrical Stimulation Action IFC    Electrical Stimulation Parameters sitting    Electrical Stimulation Goals Pain      Manual Therapy   Soft tissue mobilization to the right cervical area, right upper trap and right upper arm, to the left cervical and upper trap area    Passive ROM to the right shoulder    Manual Traction cervical with her sitting                       PT Short Term Goals - 08/11/21 0905       PT SHORT TERM GOAL #1   Title pt will be I with inital HEP    Status Achieved               PT Long Term Goals - 08/13/21 0905       PT LONG TERM GOAL #1   Title pt will be I with all advanced HEP    Status On-going      PT LONG TERM GOAL #2   Title decrease pain overall 50%    Status On-going      PT LONG TERM GOAL #3   Title report 50% less pain with driving    Status On-going                   Plan - 08/13/21 0903     Clinical Impression Statement I continue to try to add exercises slowly, she reports increase pain with exercises, I tried to educte about the need for strength to hold better posture and tolerate ADL's with less pain.  She seems to understand.  She is having some left side pain today, has had a lot of  stress with her dog dying  this week.    PT Next Visit Plan we did more exercises today, she how she responds to this    Consulted and Agree with Plan of Care Patient             Patient will benefit from skilled therapeutic intervention in order to improve the following deficits and impairments:  Decreased range of motion, Increased muscle spasms, Pain, Improper body mechanics, Decreased strength, Decreased mobility, Postural dysfunction  Visit Diagnosis: Cervicalgia  Cramp and spasm  Radiculopathy, cervical region     Problem List Patient Active Problem List   Diagnosis Date Noted   Dizziness 12/24/2020   Multiple drug allergies 12/24/2020   Dysuria 01/17/2020   Non-ST elevation (NSTEMI) myocardial infarction [facility])    Chest pain 06/21/2019   IBS (irritable bowel syndrome) 06/21/2019   Hyperbilirubinemia 06/21/2019   GERD (gastroesophageal reflux disease) 06/21/2019   Low back pain 05/11/2019   Atopic dermatitis 04/03/2019   Other specified health status 08/10/2018   Encounter for general adult medical examination without abnormal findings 06/15/2018   Noninflammatory disorder of vagina 06/13/2018   Arthralgia of right temporomandibular joint 10/28/2017   Bilateral impacted cerumen 10/28/2017   Chronic otitis externa of left ear 10/28/2017   Frequency of micturition 09/28/2017   Benign neoplasm of colon 08/24/2017   Spinal stenosis of lumbar region 08/24/2017   Fall 04/27/2017   Abnormal nuclear stress test    Other stressful life events affecting family and household 01/26/2017   Diverticula of intestine 09/28/2016   Eructation 09/28/2016   Tinea unguium 06/24/2016   Neck pain 03/12/2016   Hypertensive heart disease 05/25/2015   Hyperglycemia due to type 2 diabetes mellitus ([place]) 03/22/2015   Pure hypercholesterolemia 03/22/2015   Type II or unspecified type diabetes mellitus without mention of complication, uncontrolled 02/14/2014   Hyponatremia 02/13/2014    HTN (hypertension) 02/13/2014   Sepsis secondary to UTI ([place]) 02/12/2014   Acute pyelonephritis 02/12/2014   Pain in left leg 11/13/2013   Candidiasis 09/12/2013   Impingement syndrome of left shoulder region 02/14/2013   Spasm 12/14/2012   Unstable angina (H[facility]) 07/29/2012   Obesity (BMI 30-39.9) 07/29/2012   Chronic ulcer of skin ([place]) 06/16/2012   Hemorrhoids 06/16/2012   Bloating 06/15/2012   Generalized abdominal pain 06/15/2012   Family history of colon cancer 06/15/2012   Breast lump 01/11/2012   Microscopic hematuria 01/11/2012   Myalgia 11/24/2011   Malignant HTN with heart disease, w/o CHF, 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 [place], [place], [address] Phone: [phone]   Fax:  [phone]  Name: Ruth [name] MRN: [id number] Date of Birth: 1946/05/25

## 2021-08-18 ENCOUNTER — Ambulatory Visit: Payer: Medicare Other | Admitting: Physical Therapy

## 2021-08-18 ENCOUNTER — Encounter: Payer: Self-pay | Admitting: Physical Therapy

## 2021-08-18 ENCOUNTER — Other Ambulatory Visit: Payer: Self-pay

## 2021-08-18 DIAGNOSIS — M5412 Radiculopathy, cervical region: Secondary | ICD-10-CM

## 2021-08-18 DIAGNOSIS — M542 Cervicalgia: Secondary | ICD-10-CM

## 2021-08-18 DIAGNOSIS — R252 Cramp and spasm: Secondary | ICD-10-CM

## 2021-08-18 NOTE — Therapy (Signed)
[place]. [place], [place], [address] Phone: [phone]   Fax:  [phone]  Physical Therapy Treatment  Patient Details  Name: Ruth [name] MRN: [id number] Date of Birth: [birth year] Referring Provider (PT): Perini   Encounter Date: 08/18/2021   PT End of Session - 08/18/21 0910     Visit Number 7    Date for PT Re-Evaluation 10/07/21    Authorization Type UHC Medicare    PT Start Time 0825    PT Stop Time 0912    PT Time Calculation (min) 47 min    Activity Tolerance Patient tolerated treatment well;Patient limited by pain    Behavior During Therapy [facility] for tasks assessed/performed             Past Medical History:  Diagnosis Date   Anxiety    CAD (coronary artery disease)    a. CABG x 4 in 2006 in [place] (LIMA->LAD, VG->Diag, VG->OM, VG->RCA); b. DES to midbody of SVG-PDA/PLA 07/2012; c. 03/2015 Myoview: EF 65%, small defect of mild severit in basal inferolateral wall, low risk.   Complication of anesthesia    "quit breathing when I got ?Inovar" (07/28/2012)   Depression    Diverticulitis    Diverticulosis    Dyslipidemia    Intolerant of statins   Fecal incontinence    GERD (gastroesophageal reflux disease)    Hyperlipidemia    Hyperplastic colon polyp    IBS (irritable bowel syndrome)    Labile hypertension    a. 09/2016 Renal Artery duplex: nl renal arteries.   Migraines    "had them in my 31's" (07/28/2012)   Multiple allergies    Obesity    Steatohepatitis    Type II diabetes mellitus ([place])    a. no meds, diet controlled - takes cinnamon.    Past Surgical History:  Procedure Laterality Date   ABDOMINAL HYSTERECTOMY  1970's   BREAST LUMPECTOMY     bilateral   CARDIAC CATHETERIZATION  2006   CORONARY ANGIOPLASTY WITH STENT PLACEMENT  07/28/2012   "1; first time for me" (07/28/2012)   CORONARY ARTERY BYPASS GRAFT  2006   CABG X4   CORONARY STENT INTERVENTION N/A  03/01/2017   Procedure: Coronary Stent Intervention;  Surgeon: Troy Sine, MD;  Location: [place] [facility];  Service: Cardiovascular;  Laterality: N/A;   CORONARY STENT INTERVENTION N/A 06/23/2019   Procedure: CORONARY STENT INTERVENTION;  Surgeon: Martinique, Peter M, MD;  Location: [place] [facility];  Service: Cardiovascular;  Laterality: N/A;   EXCISIONAL HEMORRHOIDECTOMY  1970's   INGUINAL HERNIA REPAIR  1970's?   left   LEFT HEART CATH AND CORS/GRAFTS ANGIOGRAPHY N/A 03/01/2017   Procedure: Left Heart Cath and Cors/Grafts Angiography;  Surgeon: Troy Sine, MD;  Location: [place] [facility];  Service: Cardiovascular;  Laterality: N/A;   LEFT HEART CATH AND CORS/GRAFTS ANGIOGRAPHY N/A 06/22/2019   Procedure: LEFT HEART CATH AND CORS/GRAFTS ANGIOGRAPHY;  Surgeon: Belva Crome, MD;  Location: [place] [facility];  Service: Cardiovascular;  Laterality: N/A;   LEFT HEART CATHETERIZATION WITH CORONARY/GRAFT ANGIOGRAM N/A 07/28/2012   Procedure: LEFT HEART CATHETERIZATION WITH Beatrix Fetters;  Surgeon: Burnell Blanks, MD;  Location: [facility] CATH LAB;  Service: Cardiovascular;  Laterality: N/A;   PERCUTANEOUS CORONARY STENT INTERVENTION (PCI-S)  07/28/2012   Procedure: PERCUTANEOUS CORONARY STENT INTERVENTION (PCI-S);  Surgeon: Burnell Blanks, MD;  Location: [facility] CATH LAB;  Service: Cardiovascular;;   TONSILLECTOMY AND ADENOIDECTOMY  ~  1951    There were no vitals filed for this visit.   Subjective Assessment - 08/18/21 0831     Subjective I am dizzy today, my sugar and my BP are normal.  Still hurting and I think losing my dog has really got me down    Currently in Pain? Yes    Pain Score 6     Pain Location Neck    Pain Orientation Right    Pain Descriptors / Indicators Spasm;Tightness    Aggravating Factors  stress    Pain Relieving Factors this treatment helps                               [facility] Adult PT Treatment/Exercise - 08/18/21  0001       Neck Exercises: Machines for Strengthening   UBE (Upper Arm Bike) level 1 x 5 minutes      Neck Exercises: Theraband   Shoulder Extension 15 reps    Shoulder Extension Limitations yellow    Rows 15 reps    Rows Limitations yellow    Shoulder External Rotation 20 reps    Shoulder External Rotation Limitations yellow      Neck Exercises: Standing   Wall Wash flexion and CW/CCW circles    Other Standing Exercises W backs    Other Standing Exercises wand exercises      Manual Therapy   Manual Therapy Soft tissue mobilization;Manual Traction    Soft tissue mobilization to the right cervical area, right upper trap and right upper arm, to the left cervical and upper trap area    Passive ROM to the right shoulder    Manual Traction cervical with her sitting                       PT Short Term Goals - 08/11/21 0905       PT SHORT TERM GOAL #1   Title pt will be I with inital HEP    Status Achieved               PT Long Term Goals - 08/18/21 0912       PT LONG TERM GOAL #2   Title decrease pain overall 50%    Status On-going      PT LONG TERM GOAL #3   Title report 50% less pain with driving    Status On-going                   Plan - 08/18/21 0911     Clinical Impression Statement patient declined the modalities today due to the diverticulitis issues that she has.  She does report sthat she was scrubbing the floor this weekend and has irritated her shoulders and neck, she remains very tight, she reports that the manual traction feels good    PT Next Visit Plan continue to add as she tolerates    Consulted and Agree with Plan of Care Patient             Patient will benefit from skilled therapeutic intervention in order to improve the following deficits and impairments:  Decreased range of motion, Increased muscle spasms, Pain, Improper body mechanics, Decreased strength, Decreased mobility, Postural dysfunction  Visit  Diagnosis: Cervicalgia  Cramp and spasm  Radiculopathy, cervical region     Problem List Patient Active Problem List   Diagnosis Date Noted   Dizziness 12/24/2020   Multiple  drug allergies 12/24/2020   Dysuria 01/17/2020   Non-ST elevation (NSTEMI) myocardial infarction [facility])    Chest pain 06/21/2019   IBS (irritable bowel syndrome) 06/21/2019   Hyperbilirubinemia 06/21/2019   GERD (gastroesophageal reflux disease) 06/21/2019   Low back pain 05/11/2019   Atopic dermatitis 04/03/2019   Other specified health status 08/10/2018   Encounter for general adult medical examination without abnormal findings 06/15/2018   Noninflammatory disorder of vagina 06/13/2018   Arthralgia of right temporomandibular joint 10/28/2017   Bilateral impacted cerumen 10/28/2017   Chronic otitis externa of left ear 10/28/2017   Frequency of micturition 09/28/2017   Benign neoplasm of colon 08/24/2017   Spinal stenosis of lumbar region 08/24/2017   Fall 04/27/2017   Abnormal nuclear stress test    Other stressful life events affecting family and household 01/26/2017   Diverticula of intestine 09/28/2016   Eructation 09/28/2016   Tinea unguium 06/24/2016   Neck pain 03/12/2016   Hypertensive heart disease 05/25/2015   Hyperglycemia due to type 2 diabetes mellitus ([place]) 03/22/2015   Pure hypercholesterolemia 03/22/2015   Type II or unspecified type diabetes mellitus without mention of complication, uncontrolled 02/14/2014   Hyponatremia 02/13/2014   HTN (hypertension) 02/13/2014   Sepsis secondary to UTI (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 [place], [place], [address] Phone: [phone]   Fax:  [phone]  Name: Ruth [name] MRN: [id number] Date of Birth: 08-23-1946

## 2021-08-25 ENCOUNTER — Ambulatory Visit: Payer: Medicare Other | Admitting: Physical Therapy

## 2021-08-25 ENCOUNTER — Other Ambulatory Visit: Payer: Self-pay

## 2021-08-25 ENCOUNTER — Encounter: Payer: Self-pay | Admitting: Physical Therapy

## 2021-08-25 DIAGNOSIS — M542 Cervicalgia: Secondary | ICD-10-CM

## 2021-08-25 DIAGNOSIS — R252 Cramp and spasm: Secondary | ICD-10-CM

## 2021-08-25 DIAGNOSIS — M5412 Radiculopathy, cervical region: Secondary | ICD-10-CM

## 2021-08-25 NOTE — Therapy (Signed)
[facility]. [place], [place], [address] Phone: [phone]   Fax:  [phone]  Physical Therapy Treatment  Patient Details  Name: Ruth [name] MRN: [id number] Date of Birth: April 24, 1946 Referring Provider (PT): Perini   Encounter Date: 08/25/2021   PT End of Session - 08/25/21 0900     Visit Number 8    Date for PT Re-Evaluation 10/07/21    Authorization Type UHC Medicare    PT Start Time 0825    PT Stop Time 0915    PT Time Calculation (min) 50 min    Activity Tolerance Patient tolerated treatment well;Patient limited by pain    Behavior During Therapy [facility] for tasks assessed/performed             Past Medical History:  Diagnosis Date   Anxiety    CAD (coronary artery disease)    a. CABG x 4 in 2006 in [place] (LIMA->LAD, VG->Diag, VG->OM, VG->RCA); b. DES to midbody of SVG-PDA/PLA 07/2012; c. 03/2015 Myoview: EF 65%, small defect of mild severit in basal inferolateral wall, low risk.   Complication of anesthesia    "quit breathing when I got ?Inovar" (07/28/2012)   Depression    Diverticulitis    Diverticulosis    Dyslipidemia    Intolerant of statins   Fecal incontinence    GERD (gastroesophageal reflux disease)    Hyperlipidemia    Hyperplastic colon polyp    IBS (irritable bowel syndrome)    Labile hypertension    a. 09/2016 Renal Artery duplex: nl renal arteries.   Migraines    "had them in my 70's" (07/28/2012)   Multiple allergies    Obesity    Steatohepatitis    Type II diabetes mellitus ([place])    a. no meds, diet controlled - takes cinnamon.    Past Surgical History:  Procedure Laterality Date   ABDOMINAL HYSTERECTOMY  1970's   BREAST LUMPECTOMY     bilateral   CARDIAC CATHETERIZATION  2006   CORONARY ANGIOPLASTY WITH STENT PLACEMENT  07/28/2012   "1; first time for me" (07/28/2012)   CORONARY ARTERY BYPASS GRAFT  2006   CABG X4   CORONARY STENT INTERVENTION N/A  03/01/2017   Procedure: Coronary Stent Intervention;  Surgeon: Troy Sine, MD;  Location: [facility];  Service: Cardiovascular;  Laterality: N/A;   CORONARY STENT INTERVENTION N/A 06/23/2019   Procedure: CORONARY STENT INTERVENTION;  Surgeon: Martinique, Peter M, MD;  Location: [place];  Service: Cardiovascular;  Laterality: N/A;   EXCISIONAL HEMORRHOIDECTOMY  1970's   INGUINAL HERNIA REPAIR  1970's?   left   LEFT HEART CATH AND CORS/GRAFTS ANGIOGRAPHY N/A 03/01/2017   Procedure: Left Heart Cath and Cors/Grafts Angiography;  Surgeon: Troy Sine, MD;  Location: [place] [facility];  Service: Cardiovascular;  Laterality: N/A;   LEFT HEART CATH AND CORS/GRAFTS ANGIOGRAPHY N/A 06/22/2019   Procedure: LEFT HEART CATH AND CORS/GRAFTS ANGIOGRAPHY;  Surgeon: Belva Crome, MD;  Location: [place] [facility];  Service: Cardiovascular;  Laterality: N/A;   LEFT HEART CATHETERIZATION WITH CORONARY/GRAFT ANGIOGRAM N/A 07/28/2012   Procedure: LEFT HEART CATHETERIZATION WITH Beatrix Fetters;  Surgeon: Burnell Blanks, MD;  Location: [facility] CATH LAB;  Service: Cardiovascular;  Laterality: N/A;   PERCUTANEOUS CORONARY STENT INTERVENTION (PCI-S)  07/28/2012   Procedure: PERCUTANEOUS CORONARY STENT INTERVENTION (PCI-S);  Surgeon: Burnell Blanks, MD;  Location: [facility] CATH LAB;  Service: Cardiovascular;;   TONSILLECTOMY AND ADENOIDECTOMY  ~  1951  ° ° °There were no vitals filed for this visit. ° ° Subjective Assessment - 08/25/21 0823   ° ° Subjective Patient reports that she poulled on a drawer and it fell in the floor, she reports that she got down in the floor and could not get up, she reports that she had to crawl and then use a chari to get up, she reports right hamstring and calf pain and soreness, also some right shoulder and neck issues after   ° Currently in Pain? Yes   ° Pain Score 7    ° Pain Location Neck   ° Pain Orientation Right   ° Pain Descriptors / Indicators  Spasm;Tightness;Sore   ° Pain Relieving Factors the treatment really helps   ° °  °  ° °  ° ° ° ° ° ° ° ° ° ° ° ° ° ° ° ° ° ° ° ° [facility] Adult PT Treatment/Exercise - 08/25/21 0001   ° °  ° Neck Exercises: Machines for Strengthening  ° UBE (Upper Arm Bike) level 1 x 5 minutes   °  ° Neck Exercises: Standing  ° Other Standing Exercises W backs   ° Other Standing Exercises wand exercises   °  ° Neck Exercises: Seated  ° Neck Retraction 10 reps   ° Cervical Rotation Both;5 reps   ° Cervical Rotation Limitations slight pause and very gentle overpressure by PT   ° Shoulder Shrugs 20 reps   ° Shoulder Rolls 20 reps   ° Other Seated Exercise 2# stick biceps and then a small shoulder press to overhead   °  ° Moist Heat Therapy  ° Number Minutes Moist Heat 10 Minutes   ° Moist Heat Location Cervical   °  ° Electrical Stimulation  ° Electrical Stimulation Location right cervical and upper trap area   ° Electrical Stimulation Action IFC   ° Electrical Stimulation Parameters sitting   ° Electrical Stimulation Goals Pain   °  ° Manual Therapy  ° Manual Therapy Soft tissue mobilization;Manual Traction   ° Soft tissue mobilization to the right cervical area, right upper trap and right upper arm, to the left cervical and upper trap area   ° Manual Traction cervical with her sitting   ° °  °  ° °  ° ° ° ° ° ° ° ° ° ° ° ° PT Short Term Goals - 08/11/21 0905   ° °  ° PT SHORT TERM GOAL #1  ° Title pt will be I with inital HEP   ° Status Achieved   ° °  °  ° °  ° ° ° ° PT Long Term Goals - 08/25/21 0903   ° °  ° PT LONG TERM GOAL #1  ° Title pt will be I with all advanced HEP   ° Status On-going   °  ° PT LONG TERM GOAL #3  ° Baseline flexion 60 degrees, met sidebending/ rotation,    ° Status On-going   ° °  °  ° °  ° ° ° ° ° ° ° ° Plan - 08/25/21 0900   ° ° Clinical Impression Statement patient had an incident where she had to get down in the floor, reports that she could not get up, she struggled and this caused some increase of pain  in the right HS and calf and in the neck and right shoulder.  She reports that she just felt very weak in the   legs and could not get up.  She continues to have significant spasms in the right upper trap and the neck area    PT Next Visit Plan continue to add as she tolerates    Consulted and Agree with Plan of Care Patient             Patient will benefit from skilled therapeutic intervention in order to improve the following deficits and impairments:  Decreased range of motion, Increased muscle spasms, Pain, Improper body mechanics, Decreased strength, Decreased mobility, Postural dysfunction  Visit Diagnosis: Cervicalgia  Cramp and spasm  Radiculopathy, cervical region     Problem List Patient Active Problem List   Diagnosis Date Noted   Dizziness 12/24/2020   Multiple drug allergies 12/24/2020   Dysuria 01/17/2020   Non-ST elevation (NSTEMI) myocardial infarction [facility])    Chest pain 06/21/2019   IBS (irritable bowel syndrome) 06/21/2019   Hyperbilirubinemia 06/21/2019   GERD (gastroesophageal reflux disease) 06/21/2019   Low back pain 05/11/2019   Atopic dermatitis 04/03/2019   Other specified health status 08/10/2018   Encounter for general adult medical examination without abnormal findings 06/15/2018   Noninflammatory disorder of vagina 06/13/2018   Arthralgia of right temporomandibular joint 10/28/2017   Bilateral impacted cerumen 10/28/2017   Chronic otitis externa of left ear 10/28/2017   Frequency of micturition 09/28/2017   Benign neoplasm of colon 08/24/2017   Spinal stenosis of lumbar region 08/24/2017   Fall 04/27/2017   Abnormal nuclear stress test    Other stressful life events affecting family and household 01/26/2017   Diverticula of intestine 09/28/2016   Eructation 09/28/2016   Tinea unguium 06/24/2016   Neck pain 03/12/2016   Hypertensive heart disease 05/25/2015   Hyperglycemia due to type 2 diabetes mellitus ([place]) 03/22/2015   Pure  hypercholesterolemia 03/22/2015   Type II or unspecified type diabetes mellitus without mention of complication, uncontrolled 02/14/2014   Hyponatremia 02/13/2014   HTN (hypertension) 02/13/2014   Sepsis secondary to UTI ([place]) 02/12/2014   Acute pyelonephritis 02/12/2014   Pain in left leg 11/13/2013   Candidiasis 09/12/2013   Impingement syndrome of left shoulder region 02/14/2013   Spasm 12/14/2012   Unstable angina ([facility]) 07/29/2012   Obesity (BMI 30-39.9) 07/29/2012   Chronic ulcer of skin ([place]) 06/16/2012   Hemorrhoids 06/16/2012   Bloating 06/15/2012   Generalized abdominal pain 06/15/2012   Family history of colon cancer 06/15/2012   Breast lump 01/11/2012   Microscopic hematuria 01/11/2012   Myalgia 11/24/2011   Malignant HTN with heart disease, w/o CHF, 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 [place], [place], [address] Phone: [phone]   Fax:  [phone]  Name: Ruth [name] MRN: [id number] Date of Birth: [birth year]

## 2021-08-27 ENCOUNTER — Encounter: Payer: Self-pay | Admitting: Physical Therapy

## 2021-08-27 ENCOUNTER — Other Ambulatory Visit: Payer: Self-pay

## 2021-08-27 ENCOUNTER — Ambulatory Visit: Payer: Medicare Other | Admitting: Physical Therapy

## 2021-08-27 DIAGNOSIS — M542 Cervicalgia: Secondary | ICD-10-CM | POA: Diagnosis not present

## 2021-08-27 DIAGNOSIS — R252 Cramp and spasm: Secondary | ICD-10-CM

## 2021-08-27 DIAGNOSIS — M5412 Radiculopathy, cervical region: Secondary | ICD-10-CM

## 2021-08-27 NOTE — Therapy (Signed)
[facility]. [place], [place], [address] Phone: [phone]   Fax:  [phone]  Physical Therapy Treatment  Patient Details  Name: Ruth [name] MRN: [id number] Date of Birth: 12/18/1945 Referring Provider (PT): Perini   Encounter Date: 08/27/2021   PT End of Session - 08/27/21 0855     Visit Number 9    Date for PT Re-Evaluation 10/07/21    Authorization Type UHC Medicare    PT Start Time 0819    PT Stop Time 0915    PT Time Calculation (min) 56 min    Activity Tolerance Patient tolerated treatment well;Patient limited by pain    Behavior During Therapy [facility] for tasks assessed/performed             Past Medical History:  Diagnosis Date   Anxiety    CAD (coronary artery disease)    a. CABG x 4 in 2006 in [place] (LIMA->LAD, VG->Diag, VG->OM, VG->RCA); b. DES to midbody of SVG-PDA/PLA 07/2012; c. 03/2015 Myoview: EF 65%, small defect of mild severit in basal inferolateral wall, low risk.   Complication of anesthesia    "quit breathing when I got ?Inovar" (07/28/2012)   Depression    Diverticulitis    Diverticulosis    Dyslipidemia    Intolerant of statins   Fecal incontinence    GERD (gastroesophageal reflux disease)    Hyperlipidemia    Hyperplastic colon polyp    IBS (irritable bowel syndrome)    Labile hypertension    a. 09/2016 Renal Artery duplex: nl renal arteries.   Migraines    "had them in my 33's" (07/28/2012)   Multiple allergies    Obesity    Steatohepatitis    Type II diabetes mellitus ([place])    a. no meds, diet controlled - takes cinnamon.    Past Surgical History:  Procedure Laterality Date   ABDOMINAL HYSTERECTOMY  1970's   BREAST LUMPECTOMY     bilateral   CARDIAC CATHETERIZATION  2006   CORONARY ANGIOPLASTY WITH STENT PLACEMENT  07/28/2012   "1; first time for me" (07/28/2012)   CORONARY ARTERY BYPASS GRAFT  2006   CABG X4   CORONARY STENT INTERVENTION N/A  03/01/2017   Procedure: Coronary Stent Intervention;  Surgeon: Troy Sine, MD;  Location: [facility];  Service: Cardiovascular;  Laterality: N/A;   CORONARY STENT INTERVENTION N/A 06/23/2019   Procedure: CORONARY STENT INTERVENTION;  Surgeon: Martinique, Peter M, MD;  Location: [facility];  Service: Cardiovascular;  Laterality: N/A;   EXCISIONAL HEMORRHOIDECTOMY  1970's   INGUINAL HERNIA REPAIR  1970's?   left   LEFT HEART CATH AND CORS/GRAFTS ANGIOGRAPHY N/A 03/01/2017   Procedure: Left Heart Cath and Cors/Grafts Angiography;  Surgeon: Troy Sine, MD;  Location: [facility];  Service: Cardiovascular;  Laterality: N/A;   LEFT HEART CATH AND CORS/GRAFTS ANGIOGRAPHY N/A 06/22/2019   Procedure: LEFT HEART CATH AND CORS/GRAFTS ANGIOGRAPHY;  Surgeon: Belva Crome, MD;  Location: [facility];  Service: Cardiovascular;  Laterality: N/A;   LEFT HEART CATHETERIZATION WITH CORONARY/GRAFT ANGIOGRAM N/A 07/28/2012   Procedure: LEFT HEART CATHETERIZATION WITH Beatrix Fetters;  Surgeon: Burnell Blanks, MD;  Location: [facility] CATH LAB;  Service: Cardiovascular;  Laterality: N/A;   PERCUTANEOUS CORONARY STENT INTERVENTION (PCI-S)  07/28/2012   Procedure: PERCUTANEOUS CORONARY STENT INTERVENTION (PCI-S);  Surgeon: Burnell Blanks, MD;  Location: [facility] CATH LAB;  Service: Cardiovascular;;   TONSILLECTOMY AND ADENOIDECTOMY  ~  1951    There were no vitals filed for this visit.   Subjective Assessment - 08/27/21 0827     Subjective Patient reports IBS issues today, reports less pain    Currently in Pain? Yes    Pain Score 5     Pain Location Neck    Pain Orientation Right    Pain Descriptors / Indicators Spasm;Tightness    Aggravating Factors  activity                               [facility] Adult PT Treatment/Exercise - 08/27/21 0001       Neck Exercises: Theraband   Shoulder Extension 15 reps    Shoulder Extension Limitations  yellow    Rows 15 reps    Rows Limitations yellow    Shoulder External Rotation 20 reps    Shoulder External Rotation Limitations yellow      Neck Exercises: Standing   Other Standing Exercises W backs    Other Standing Exercises wand exercises      Neck Exercises: Seated   Neck Retraction 10 reps    Cervical Rotation Both;5 reps    Cervical Rotation Limitations slight pause and very gentle overpressure by PT    Shoulder Shrugs 20 reps    Other Seated Exercise 2# stick biceps and then a small shoulder press to overhead      Moist Heat Therapy   Number Minutes Moist Heat 10 Minutes    Moist Heat Location Cervical      Electrical Stimulation   Electrical Stimulation Location right cervical and upper trap area    Electrical Stimulation Action IFC    Electrical Stimulation Parameters sitting    Electrical Stimulation Goals Pain      Manual Therapy   Manual Therapy Soft tissue mobilization;Manual Traction    Soft tissue mobilization to the right cervical area, right upper trap and right upper arm, to the left cervical and upper trap area    Manual Traction cervical with her sitting                       PT Short Term Goals - 08/11/21 0905       PT SHORT TERM GOAL #1   Title pt will be I with inital HEP    Status Achieved               PT Long Term Goals - 08/27/21 0858       PT LONG TERM GOAL #1   Title pt will be I with all advanced HEP    Status On-going      PT LONG TERM GOAL #2   Title decrease pain overall 50%    Status On-going      PT LONG TERM GOAL #3   Title report 50% less pain with driving    Status On-going      PT LONG TERM GOAL #4   Title increase cervical ROM 25%    Status Partially Met      PT LONG TERM GOAL #5   Title understand posture and body mechanics    Status On-going                   Plan - 08/27/21 0856     Clinical Impression Statement Patient still recovering from getting in floor and having  difficulty getting up.  she has less issues on the left  today, still tight and tender in the right upper trap and cervical area, she has health issues that do not allow her to lie down so I have been doing the traction with my hands with her sitting.  She also reports the IBS issues and at times cannot or will not do exercises in standing.    PT Next Visit Plan continue to add as she tolerates    Consulted and Agree with Plan of Care Patient             Patient will benefit from skilled therapeutic intervention in order to improve the following deficits and impairments:  Decreased range of motion, Increased muscle spasms, Pain, Improper body mechanics, Decreased strength, Decreased mobility, Postural dysfunction  Visit Diagnosis: Cervicalgia  Cramp and spasm  Radiculopathy, cervical region     Problem List Patient Active Problem List   Diagnosis Date Noted   Dizziness 12/24/2020   Multiple drug allergies 12/24/2020   Dysuria 01/17/2020   Non-ST elevation (NSTEMI) myocardial infarction [facility])    Chest pain 06/21/2019   IBS (irritable bowel syndrome) 06/21/2019   Hyperbilirubinemia 06/21/2019   GERD (gastroesophageal reflux disease) 06/21/2019   Low back pain 05/11/2019   Atopic dermatitis 04/03/2019   Other specified health status 08/10/2018   Encounter for general adult medical examination without abnormal findings 06/15/2018   Noninflammatory disorder of vagina 06/13/2018   Arthralgia of right temporomandibular joint 10/28/2017   Bilateral impacted cerumen 10/28/2017   Chronic otitis externa of left ear 10/28/2017   Frequency of micturition 09/28/2017   Benign neoplasm of colon 08/24/2017   Spinal stenosis of lumbar region 08/24/2017   Fall 04/27/2017   Abnormal nuclear stress test    Other stressful life events affecting family and household 01/26/2017   Diverticula of intestine 09/28/2016   Eructation 09/28/2016   Tinea unguium 06/24/2016   Neck pain 03/12/2016    Hypertensive heart disease 05/25/2015   Hyperglycemia due to type 2 diabetes mellitus ([place]) 03/22/2015   Pure hypercholesterolemia 03/22/2015   Type II or unspecified type diabetes mellitus without mention of complication, uncontrolled 02/14/2014   Hyponatremia 02/13/2014   HTN (hypertension) 02/13/2014   Sepsis secondary to UTI ([place]) 02/12/2014   Acute pyelonephritis 02/12/2014   Pain in left leg 11/13/2013   Candidiasis 09/12/2013   Impingement syndrome of left shoulder region 02/14/2013   Spasm 12/14/2012   Unstable angina ([facility]) 07/29/2012   Obesity (BMI 30-39.9) 07/29/2012   Chronic ulcer of skin ([place]) 06/16/2012   Hemorrhoids 06/16/2012   Bloating 06/15/2012   Generalized abdominal pain 06/15/2012   Family history of colon cancer 06/15/2012   Breast lump 01/11/2012   Microscopic hematuria 01/11/2012   Myalgia 11/24/2011   Malignant HTN with heart disease, w/o CHF, 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 [place], [place], [address] Phone: [phone]   Fax:  [phone]  Name: Ruth [name] MRN: [id number] Date of Birth: [birth year]

## 2021-09-03 ENCOUNTER — Other Ambulatory Visit: Payer: Self-pay

## 2021-09-03 ENCOUNTER — Ambulatory Visit: Payer: Medicare Other | Admitting: Physical Therapy

## 2021-09-03 ENCOUNTER — Encounter: Payer: Self-pay | Admitting: Physical Therapy

## 2021-09-03 DIAGNOSIS — M5412 Radiculopathy, cervical region: Secondary | ICD-10-CM

## 2021-09-03 DIAGNOSIS — M542 Cervicalgia: Secondary | ICD-10-CM

## 2021-09-03 DIAGNOSIS — R252 Cramp and spasm: Secondary | ICD-10-CM

## 2021-09-03 NOTE — Therapy (Signed)
[address] [place], [place], [address] Phone: [phone]   Fax:  [phone] Progress Note Reporting Period 07/23/21 to 09/03/21 for the first 10 visits  See note below for Objective Data and Assessment of Progress/Goals.     Physical Therapy Treatment  Patient Details  Name: Ruth [name] MRN: [id number] Date of Birth: Jan 26, 1946 Referring Provider (PT): Perini   Encounter Date: 09/03/2021   PT End of Session - 09/03/21 0903     Visit Number 10    Date for PT Re-Evaluation 10/07/21    Authorization Type UHC Medicare    PT Start Time 0835    PT Stop Time 0923    PT Time Calculation (min) 48 min    Activity Tolerance Patient tolerated treatment well;Patient limited by pain    Behavior During Therapy [facility] for tasks assessed/performed             Past Medical History:  Diagnosis Date   Anxiety    CAD (coronary artery disease)    a. CABG x 4 in 2006 in [place] (LIMA->LAD, VG->Diag, VG->OM, VG->RCA); b. DES to midbody of SVG-PDA/PLA 07/2012; c. 03/2015 Myoview: EF 65%, small defect of mild severit in basal inferolateral wall, low risk.   Complication of anesthesia    "quit breathing when I got ?Inovar" (07/28/2012)   Depression    Diverticulitis    Diverticulosis    Dyslipidemia    Intolerant of statins   Fecal incontinence    GERD (gastroesophageal reflux disease)    Hyperlipidemia    Hyperplastic colon polyp    IBS (irritable bowel syndrome)    Labile hypertension    a. 09/2016 Renal Artery duplex: nl renal arteries.   Migraines    "had them in my 51's" (07/28/2012)   Multiple allergies    Obesity    Steatohepatitis    Type II diabetes mellitus ([place])    a. no meds, diet controlled - takes cinnamon.    Past Surgical History:  Procedure Laterality Date   ABDOMINAL HYSTERECTOMY  1970's   BREAST LUMPECTOMY     bilateral   CARDIAC CATHETERIZATION  2006   CORONARY ANGIOPLASTY WITH  STENT PLACEMENT  07/28/2012   "1; first time for me" (07/28/2012)   CORONARY ARTERY BYPASS GRAFT  2006   CABG X4   CORONARY STENT INTERVENTION N/A 03/01/2017   Procedure: Coronary Stent Intervention;  Surgeon: Troy Sine, MD;  Location: [place] [facility];  Service: Cardiovascular;  Laterality: N/A;   CORONARY STENT INTERVENTION N/A 06/23/2019   Procedure: CORONARY STENT INTERVENTION;  Surgeon: Martinique, Peter M, MD;  Location: [place] [facility];  Service: Cardiovascular;  Laterality: N/A;   EXCISIONAL HEMORRHOIDECTOMY  1970's   INGUINAL HERNIA REPAIR  1970's?   left   LEFT HEART CATH AND CORS/GRAFTS ANGIOGRAPHY N/A 03/01/2017   Procedure: Left Heart Cath and Cors/Grafts Angiography;  Surgeon: Troy Sine, MD;  Location: [place] [facility];  Service: Cardiovascular;  Laterality: N/A;   LEFT HEART CATH AND CORS/GRAFTS ANGIOGRAPHY N/A 06/22/2019   Procedure: LEFT HEART CATH AND CORS/GRAFTS ANGIOGRAPHY;  Surgeon: Belva Crome, MD;  Location: [place] [facility];  Service: Cardiovascular;  Laterality: N/A;   LEFT HEART CATHETERIZATION WITH CORONARY/GRAFT ANGIOGRAM N/A 07/28/2012   Procedure: LEFT HEART CATHETERIZATION WITH Beatrix Fetters;  Surgeon: Burnell Blanks, MD;  Location: [facility] CATH LAB;  Service: Cardiovascular;  Laterality: N/A;   PERCUTANEOUS CORONARY STENT INTERVENTION (PCI-S)  07/28/2012  Procedure: PERCUTANEOUS CORONARY STENT INTERVENTION (PCI-S);  Surgeon: Burnell Blanks, MD;  Location: [facility] CATH LAB;  Service: Cardiovascular;;   TONSILLECTOMY AND ADENOIDECTOMY  ~ 1951    There were no vitals filed for this visit.   Subjective Assessment - 09/03/21 0837     Subjective Patient reports that she has had some stress, reports her cooking for Christmas did not go well, she reports that this caused stress and then she is thinking of adopting a dog, she is excited but also talks about all the things she needs to do.    Currently in Pain? Yes    Pain Score 5      Pain Location Neck    Pain Orientation Right    Pain Descriptors / Indicators Spasm;Tightness    Aggravating Factors  stress    Pain Relieving Factors this does help, coming here                               [facility] Adult PT Treatment/Exercise - 09/03/21 0001       Moist Heat Therapy   Number Minutes Moist Heat 10 Minutes    Moist Heat Location Cervical      Electrical Stimulation   Electrical Stimulation Location right cervical and upper trap area    Electrical Stimulation Action IFC    Electrical Stimulation Parameters sitting    Electrical Stimulation Goals Pain      Manual Therapy   Manual Therapy Soft tissue mobilization;Manual Traction    Soft tissue mobilization to the right cervical area, right upper trap and right upper arm, to the left cervical and upper trap area    Manual Traction cervical with her sitting                       PT Short Term Goals - 08/11/21 0905       PT SHORT TERM GOAL #1   Title pt will be I with inital HEP    Status Achieved               PT Long Term Goals - 09/03/21 0905       PT LONG TERM GOAL #2   Title decrease pain overall 50%    Status On-going      PT LONG TERM GOAL #3   Title report 50% less pain with driving    Status On-going                   Plan - 09/03/21 0903     Clinical Impression Statement Patient continues to report stress aggravating the neck pain and tightness, she is tight in the upper traps and the cervical area, worse on the right, she has issues with many things, reports that she cannot lie down so traction is done with hands in sitting, she is also allergic to any creams so STM is done through clothes and without much gliding, at the end of soft tissue work she does feel looser to me and she does report less pain and stiffness    PT Next Visit Plan work on the ROM and stiffness    Consulted and Agree with Plan of Care Patient             Patient  will benefit from skilled therapeutic intervention in order to improve the following deficits and impairments:  Decreased range of motion, Increased muscle spasms, Pain, Improper body  mechanics, Decreased strength, Decreased mobility, Postural dysfunction  Visit Diagnosis: Cervicalgia  Cramp and spasm  Radiculopathy, cervical region     Problem List Patient Active Problem List   Diagnosis Date Noted   Dizziness 12/24/2020   Multiple drug allergies 12/24/2020   Dysuria 01/17/2020   Non-ST elevation (NSTEMI) myocardial infarction [facility])    Chest pain 06/21/2019   IBS (irritable bowel syndrome) 06/21/2019   Hyperbilirubinemia 06/21/2019   GERD (gastroesophageal reflux disease) 06/21/2019   Low back pain 05/11/2019   Atopic dermatitis 04/03/2019   Other specified health status 08/10/2018   Encounter for general adult medical examination without abnormal findings 06/15/2018   Noninflammatory disorder of vagina 06/13/2018   Arthralgia of right temporomandibular joint 10/28/2017   Bilateral impacted cerumen 10/28/2017   Chronic otitis externa of left ear 10/28/2017   Frequency of micturition 09/28/2017   Benign neoplasm of colon 08/24/2017   Spinal stenosis of lumbar region 08/24/2017   Fall 04/27/2017   Abnormal nuclear stress test    Other stressful life events affecting family and household 01/26/2017   Diverticula of intestine 09/28/2016   Eructation 09/28/2016   Tinea unguium 06/24/2016   Neck pain 03/12/2016   Hypertensive heart disease 05/25/2015   Hyperglycemia due to type 2 diabetes mellitus ([place]) 03/22/2015   Pure hypercholesterolemia 03/22/2015   Type II or unspecified type diabetes mellitus without mention of complication, uncontrolled 02/14/2014   Hyponatremia 02/13/2014   HTN (hypertension) 02/13/2014   Sepsis secondary to UTI ([place]) 02/12/2014   Acute pyelonephritis 02/12/2014   Pain in left leg 11/13/2013   Candidiasis 09/12/2013   Impingement syndrome of  left shoulder region 02/14/2013   Spasm 12/14/2012   Unstable angina (HCC) 07/29/2012   Obesity (BMI 30-39.9) 07/29/2012   Chronic ulcer of skin ([place]) 06/16/2012   Hemorrhoids 06/16/2012   Bloating 06/15/2012   Generalized abdominal pain 06/15/2012   Family history of colon cancer 06/15/2012   Breast lump 01/11/2012   Microscopic hematuria 01/11/2012   Myalgia 11/24/2011   Malignant HTN with heart disease, w/o CHF, 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 [place], [place], [address] Phone: [phone]   Fax:  [phone]  Name: FONNIE CROOKSHANKS MRN: [id number] Date of Birth: 05-03-1946

## 2021-09-05 ENCOUNTER — Encounter: Payer: Self-pay | Admitting: Physical Therapy

## 2021-09-05 ENCOUNTER — Ambulatory Visit: Payer: Medicare Other | Admitting: Physical Therapy

## 2021-09-05 ENCOUNTER — Other Ambulatory Visit: Payer: Self-pay

## 2021-09-05 DIAGNOSIS — M5412 Radiculopathy, cervical region: Secondary | ICD-10-CM

## 2021-09-05 DIAGNOSIS — M542 Cervicalgia: Secondary | ICD-10-CM | POA: Diagnosis not present

## 2021-09-05 DIAGNOSIS — R252 Cramp and spasm: Secondary | ICD-10-CM

## 2021-09-05 NOTE — Therapy (Signed)
[address]. [place], [place], [address] Phone: [phone]   Fax:  [phone]  Physical Therapy Treatment  Patient Details  Name: Ruth [name] MRN: [id number] Date of Birth: 28-Apr-1946 Referring Provider (PT): Perini   Encounter Date: 09/05/2021   PT End of Session - 09/05/21 0908     Visit Number 11    Date for PT Re-Evaluation 10/07/21    Authorization Type UHC Medicare    PT Start Time 0835    PT Stop Time 0928    PT Time Calculation (min) 53 min    Activity Tolerance Patient tolerated treatment well;Patient limited by pain    Behavior During Therapy [facility] for tasks assessed/performed             Past Medical History:  Diagnosis Date   Anxiety    CAD (coronary artery disease)    a. CABG x 4 in 2006 in [place] (LIMA->LAD, VG->Diag, VG->OM, VG->RCA); b. DES to midbody of SVG-PDA/PLA 07/2012; c. 03/2015 Myoview: EF 65%, small defect of mild severit in basal inferolateral wall, low risk.   Complication of anesthesia    "quit breathing when I got ?Inovar" (07/28/2012)   Depression    Diverticulitis    Diverticulosis    Dyslipidemia    Intolerant of statins   Fecal incontinence    GERD (gastroesophageal reflux disease)    Hyperlipidemia    Hyperplastic colon polyp    IBS (irritable bowel syndrome)    Labile hypertension    a. 09/2016 Renal Artery duplex: nl renal arteries.   Migraines    "had them in my 34's" (07/28/2012)   Multiple allergies    Obesity    Steatohepatitis    Type II diabetes mellitus ([place])    a. no meds, diet controlled - takes cinnamon.    Past Surgical History:  Procedure Laterality Date   ABDOMINAL HYSTERECTOMY  1970's   BREAST LUMPECTOMY     bilateral   CARDIAC CATHETERIZATION  2006   CORONARY ANGIOPLASTY WITH STENT PLACEMENT  07/28/2012   "1; first time for me" (07/28/2012)   CORONARY ARTERY BYPASS GRAFT  2006   CABG X4   CORONARY STENT INTERVENTION N/A  03/01/2017   Procedure: Coronary Stent Intervention;  Surgeon: Troy Sine, MD;  Location: [place] [facility];  Service: Cardiovascular;  Laterality: N/A;   CORONARY STENT INTERVENTION N/A 06/23/2019   Procedure: CORONARY STENT INTERVENTION;  Surgeon: Martinique, Peter M, MD;  Location: [place] [facility];  Service: Cardiovascular;  Laterality: N/A;   EXCISIONAL HEMORRHOIDECTOMY  1970's   INGUINAL HERNIA REPAIR  1970's?   left   LEFT HEART CATH AND CORS/GRAFTS ANGIOGRAPHY N/A 03/01/2017   Procedure: Left Heart Cath and Cors/Grafts Angiography;  Surgeon: Troy Sine, MD;  Location: [place] [facility];  Service: Cardiovascular;  Laterality: N/A;   LEFT HEART CATH AND CORS/GRAFTS ANGIOGRAPHY N/A 06/22/2019   Procedure: LEFT HEART CATH AND CORS/GRAFTS ANGIOGRAPHY;  Surgeon: Belva Crome, MD;  Location: [place] [facility];  Service: Cardiovascular;  Laterality: N/A;   LEFT HEART CATHETERIZATION WITH CORONARY/GRAFT ANGIOGRAM N/A 07/28/2012   Procedure: LEFT HEART CATHETERIZATION WITH Beatrix Fetters;  Surgeon: Burnell Blanks, MD;  Location: [facility] C[facility] LAB;  Service: Cardiovascular;  Laterality: N/A;   PERCUTANEOUS CORONARY STENT INTERVENTION (PCI-S)  07/28/2012   Procedure: PERCUTANEOUS CORONARY STENT INTERVENTION (PCI-S);  Surgeon: Burnell Blanks, MD;  Location: [facility] CATH LAB;  Service: Cardiovascular;;   TONSILLECTOMY AND ADENOIDECTOMY  ~  1951    There were no vitals filed for this visit.   Subjective Assessment - 09/05/21 0840     Subjective I got a new puppy and I feel a little more stress, she reports elevated BP yesterday.  She reports that s feels like she has it under control, she did not want me to take it today    Currently in Pain? Yes    Pain Score 6     Pain Location Neck    Pain Orientation Right    Pain Descriptors / Indicators Sore;Aching    Aggravating Factors  stress                               [facility] Adult PT Treatment/Exercise  - 09/05/21 0001       Neck Exercises: Machines for Strengthening   UBE (Upper Arm Bike) level 1 x 5 minutes      Neck Exercises: Theraband   Shoulder Extension 15 reps    Shoulder Extension Limitations yellow    Rows 15 reps    Rows Limitations yellow      Neck Exercises: Standing   Other Standing Exercises W backs/shrugs    Other Standing Exercises wand exercises      Moist Heat Therapy   Number Minutes Moist Heat 10 Minutes    Moist Heat Location Cervical      Electrical Stimulation   Electrical Stimulation Location right cervical and upper trap area    Electrical Stimulation Action IFC    Electrical Stimulation Parameters sitting    Electrical Stimulation Goals Pain      Manual Therapy   Manual Therapy Soft tissue mobilization;Manual Traction    Soft tissue mobilization to the right cervical area, right upper trap and right upper arm, to the left cervical and upper trap area    Manual Traction cervical with her sitting                       PT Short Term Goals - 08/11/21 0905       PT SHORT TERM GOAL #1   Title pt will be I with inital HEP    Status Achieved               PT Long Term Goals - 09/05/21 0910       PT LONG TERM GOAL #2   Title decrease pain overall 50%    Status Partially Met                   Plan - 09/05/21 0908     Clinical Impression Statement Patient continues with stress at home with getting a new puppy, but she is very happy about it Just reports having some BP issues yesterday with doing a lot more stuff with the dog, she has some incresaed shoulder and neck tightness,    PT Next Visit Plan will try to get her doing a few more exercises she will usually want to stop due to bowel issues    Consulted and Agree with Plan of Care Patient             Patient will benefit from skilled therapeutic intervention in order to improve the following deficits and impairments:  Decreased range of motion, Increased  muscle spasms, Pain, Improper body mechanics, Decreased strength, Decreased mobility, Postural dysfunction  Visit Diagnosis: Cervicalgia  Cramp and spasm  Radiculopathy, cervical region  Problem List Patient Active Problem List   Diagnosis Date Noted   Dizziness 12/24/2020   Multiple drug allergies 12/24/2020   Dysuria 01/17/2020   Non-ST elevation (NSTEMI) myocardial infarction [facility])    Chest pain 06/21/2019   IBS (irritable bowel syndrome) 06/21/2019   Hyperbilirubinemia 06/21/2019   GERD (gastroesophageal reflux disease) 06/21/2019   Low back pain 05/11/2019   Atopic dermatitis 04/03/2019   Other specified health status 08/10/2018   Encounter for general adult medical examination without abnormal findings 06/15/2018   Noninflammatory disorder of vagina 06/13/2018   Arthralgia of right temporomandibular joint 10/28/2017   Bilateral impacted cerumen 10/28/2017   Chronic otitis externa of left ear 10/28/2017   Frequency of micturition 09/28/2017   Benign neoplasm of colon 08/24/2017   Spinal stenosis of lumbar region 08/24/2017   Fall 04/27/2017   Abnormal nuclear stress test    Other stressful life events affecting family and household 01/26/2017   Diverticula of intestine 09/28/2016   Eructation 09/28/2016   Tinea unguium 06/24/2016   Neck pain 03/12/2016   Hypertensive heart disease 05/25/2015   Hyperglycemia due to type 2 diabetes mellitus ([place]) 03/22/2015   Pure hypercholesterolemia 03/22/2015   Type II or unspecified type diabetes mellitus without mention of complication, uncontrolled 02/14/2014   Hyponatremia 02/13/2014   HTN (hypertension) 02/13/2014   Sepsis secondary to UTI ([place]) 02/12/2014   Acute pyelonephritis 02/12/2014   Pain in left leg 11/13/2013   Candidiasis 09/12/2013   Impingement syndrome of left shoulder region 02/14/2013   Spasm 12/14/2012   Unstable angina (HCC) 07/29/2012   Obesity (BMI 30-39.9) 07/29/2012   Chronic ulcer of skin  ([place]) 06/16/2012   Hemorrhoids 06/16/2012   Bloating 06/15/2012   Generalized abdominal pain 06/15/2012   Family history of colon cancer 06/15/2012   Breast lump 01/11/2012   Microscopic hematuria 01/11/2012   Myalgia 11/24/2011   Malignant HTN with heart disease, w/o CHF, 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 [place], [place], [address] Phone: [phone]   Fax:  [phone]  Name: Ruth [name] MRN: [id number] Date of Birth: [birth year]

## 2021-09-08 ENCOUNTER — Other Ambulatory Visit: Payer: Self-pay | Admitting: Cardiovascular Disease

## 2021-09-08 DIAGNOSIS — I2510 Atherosclerotic heart disease of native coronary artery without angina pectoris: Secondary | ICD-10-CM

## 2021-09-08 DIAGNOSIS — E7800 Pure hypercholesterolemia, unspecified: Secondary | ICD-10-CM

## 2021-09-10 ENCOUNTER — Ambulatory Visit: Payer: Medicare Other | Admitting: Physical Therapy

## 2021-09-12 ENCOUNTER — Ambulatory Visit: Payer: Medicare Other | Admitting: Physical Therapy

## 2021-09-15 ENCOUNTER — Ambulatory Visit: Payer: Medicare Other | Attending: Neurological Surgery | Admitting: Physical Therapy

## 2021-09-15 ENCOUNTER — Encounter: Payer: Self-pay | Admitting: Physical Therapy

## 2021-09-15 ENCOUNTER — Other Ambulatory Visit: Payer: Self-pay

## 2021-09-15 DIAGNOSIS — R252 Cramp and spasm: Secondary | ICD-10-CM | POA: Diagnosis present

## 2021-09-15 DIAGNOSIS — M542 Cervicalgia: Secondary | ICD-10-CM | POA: Diagnosis present

## 2021-09-15 DIAGNOSIS — M5412 Radiculopathy, cervical region: Secondary | ICD-10-CM | POA: Diagnosis present

## 2021-09-15 NOTE — Therapy (Signed)
[place] [place], [place], [address] Phone: [phone]   Fax:  [phone]  Physical Therapy Treatment  Patient Details  Name: Ruth [name] MRN: [id number] Date of Birth: 1946-04-22 Referring Provider (PT): Perini   Encounter Date: 09/15/2021   PT End of Session - 09/15/21 0918     Visit Number 12    Date for PT Re-Evaluation 10/07/21    Authorization Type UHC Medicare    PT Start Time 0840    PT Stop Time 0925    PT Time Calculation (min) 45 min    Activity Tolerance Patient tolerated treatment well;Patient limited by pain    Behavior During Therapy [facility] for tasks assessed/performed             Past Medical History:  Diagnosis Date   Anxiety    CAD (coronary artery disease)    a. CABG x 4 in 2006 in [place] (LIMA->LAD, VG->Diag, VG->OM, VG->RCA); b. DES to midbody of SVG-PDA/PLA 07/2012; c. 03/2015 Myoview: EF 65%, small defect of mild severit in basal inferolateral wall, low risk.   Complication of anesthesia    "quit breathing when I got ?Inovar" (07/28/2012)   Depression    Diverticulitis    Diverticulosis    Dyslipidemia    Intolerant of statins   Fecal incontinence    GERD (gastroesophageal reflux disease)    Hyperlipidemia    Hyperplastic colon polyp    IBS (irritable bowel syndrome)    Labile hypertension    a. 09/2016 Renal Artery duplex: nl renal arteries.   Migraines    "had them in my 80's" (07/28/2012)   Multiple allergies    Obesity    Steatohepatitis    Type II diabetes mellitus ([place])    a. no meds, diet controlled - takes cinnamon.    Past Surgical History:  Procedure Laterality Date   ABDOMINAL HYSTERECTOMY  1970's   BREAST LUMPECTOMY     bilateral   CARDIAC CATHETERIZATION  2006   CORONARY ANGIOPLASTY WITH STENT PLACEMENT  07/28/2012   "1; first time for me" (07/28/2012)   CORONARY ARTERY BYPASS GRAFT  2006   CABG X4   CORONARY STENT INTERVENTION N/A  03/01/2017   Procedure: Coronary Stent Intervention;  Surgeon: Troy Sine, MD;  Location: [place] CV LAB;  Service: Cardiovascular;  Laterality: N/A;   CORONARY STENT INTERVENTION N/A 06/23/2019   Procedure: CORONARY STENT INTERVENTION;  Surgeon: Martinique, Peter M, MD;  Location: [place] CV LAB;  Service: Cardiovascular;  Laterality: N/A;   EXCISIONAL HEMORRHOIDECTOMY  1970's   INGUINAL HERNIA REPAIR  1970's?   left   LEFT HEART CATH AND CORS/GRAFTS ANGIOGRAPHY N/A 03/01/2017   Procedure: Left Heart Cath and Cors/Grafts Angiography;  Surgeon: Troy Sine, MD;  Location: [place] CV LAB;  Service: Cardiovascular;  Laterality: N/A;   LEFT HEART CATH AND CORS/GRAFTS ANGIOGRAPHY N/A 06/22/2019   Procedure: LEFT HEART CATH AND CORS/GRAFTS ANGIOGRAPHY;  Surgeon: Belva Crome, MD;  Location: [place] [facility] LAB;  Service: Cardiovascular;  Laterality: N/A;   LEFT HEART CATHETERIZATION WITH CORONARY/GRAFT ANGIOGRAM N/A 07/28/2012   Procedure: LEFT HEART CATHETERIZATION WITH Beatrix Fetters;  Surgeon: Burnell Blanks, MD;  Location[facility] C[facility];  Service: Cardiovascular;  Laterality: N/A;   PERCUTANEOUS CORONARY STENT INTERVENTION (PCI-S)  07/28/2012   Procedure: PERCUTANEOUS CORONARY STENT INTERVENTION (PCI-S);  Surgeon: Burnell Blanks, MD;  Location: [facility] CATH LAB;  Service: Cardiovascular;;   TONSILLECTOMY AND ADENOIDECTOMY  ~  1951    There were no vitals filed for this visit.   Subjective Assessment - 09/15/21 0910     Subjective Patient reports that she was sick lat week and had to cancel, reports increased neck pain and tightness    Currently in Pain? Yes    Pain Score 8     Pain Location Neck    Pain Orientation Right;Left    Pain Descriptors / Indicators Sore;Tightness    Aggravating Factors  stress, not having treatment                               [facility] Adult PT Treatment/Exercise - 09/15/21 0001       Moist Heat Therapy    Number Minutes Moist Heat 10 Minutes    Moist Heat Location Cervical      Electrical Stimulation   Electrical Stimulation Location right cervical and upper trap area    Electrical Stimulation Action IFC    Electrical Stimulation Parameters sitting    Electrical Stimulation Goals Pain      Manual Therapy   Manual Therapy Soft tissue mobilization;Manual Traction    Manual therapy comments DTM over bil upper traps, levator scapulae, sub-occipitals    Soft tissue mobilization to the right cervical area, right upper trap and right upper arm, to the left cervical and upper trap area    Manual Traction cervical with her sitting                       PT Short Term Goals - 08/11/21 0905       PT SHORT TERM GOAL #1   Title pt will be I with inital HEP    Status Achieved               PT Long Term Goals - 09/05/21 0910       PT LONG TERM GOAL #2   Title decrease pain overall 50%    Status Partially Met                   Plan - 09/15/21 0918     Clinical Impression Statement Patient reports that she was sick last week, reports that she has since had increased pain, tightness and stiffness in the neck.  She is much tighter with palpation.  She report that IBS is bad today and did not want to try exercise    PT Next Visit Plan will try to get her doing a few more exercises she will usually want to stop due to bowel issues    Consulted and Agree with Plan of Care Patient             Patient will benefit from skilled therapeutic intervention in order to improve the following deficits and impairments:  Decreased range of motion, Increased muscle spasms, Pain, Improper body mechanics, Decreased strength, Decreased mobility, Postural dysfunction  Visit Diagnosis: Cervicalgia  Cramp and spasm  Radiculopathy, cervical region     Problem List Patient Active Problem List   Diagnosis Date Noted   Dizziness 12/24/2020   Multiple drug allergies  12/24/2020   Dysuria 01/17/2020   Non-ST elevation (NSTEMI) myocardial infarction [facility])    Chest pain 06/21/2019   IBS (irritable bowel syndrome) 06/21/2019   Hyperbilirubinemia 06/21/2019   GERD (gastroesophageal reflux disease) 06/21/2019   Low back pain 05/11/2019   Atopic dermatitis 04/03/2019   Other specified health status 08/10/2018  Encounter for general adult medical examination without abnormal findings 06/15/2018   Noninflammatory disorder of vagina 06/13/2018   Arthralgia of right temporomandibular joint 10/28/2017   Bilateral impacted cerumen 10/28/2017   Chronic otitis externa of left ear 10/28/2017   Frequency of micturition 09/28/2017   Benign neoplasm of colon 08/24/2017   Spinal stenosis of lumbar region 08/24/2017   Fall 04/27/2017   Abnormal nuclear stress test    Other stressful life events affecting family and household 01/26/2017   Diverticula of intestine 09/28/2016   Eructation 09/28/2016   Tinea unguium 06/24/2016   Neck pain 03/12/2016   Hypertensive heart disease 05/25/2015   Hyperglycemia due to type 2 diabetes mellitus ([place]) 03/22/2015   Pure hypercholesterolemia 03/22/2015   Type II or unspecified type diabetes mellitus without mention of complication, uncontrolled 02/14/2014   Hyponatremia 02/13/2014   HTN (hypertension) 02/13/2014   Sepsis secondary to UTI ([place]) 02/12/2014   Acute pyelonephritis 02/12/2014   Pain in left leg 11/13/2013   Candidiasis 09/12/2013   Impingement syndrome of left shoulder region 02/14/2013   Spasm 12/14/2012   Unstable angina (HCC) 07/29/2012   Obesity (BMI 30-39.9) 07/29/2012   Chronic ulcer of skin ([place]) 06/16/2012   Hemorrhoids 06/16/2012   Bloating 06/15/2012   Generalized abdominal pain 06/15/2012   Family history of colon cancer 06/15/2012   Breast lump 01/11/2012   Microscopic hematuria 01/11/2012   Myalgia 11/24/2011   Malignant HTN with heart disease, w/o CHF, 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 [place], [place], [address] Phone: [phone]   Fax:  [phone]  Name: Ruth [name] MRN: [id number] Date of Birth: 1946-01-13

## 2021-09-17 ENCOUNTER — Ambulatory Visit: Payer: Medicare Other | Admitting: Physical Therapy

## 2021-09-17 ENCOUNTER — Encounter: Payer: Self-pay | Admitting: Physical Therapy

## 2021-09-17 ENCOUNTER — Other Ambulatory Visit: Payer: Self-pay

## 2021-09-17 DIAGNOSIS — M5412 Radiculopathy, cervical region: Secondary | ICD-10-CM

## 2021-09-17 DIAGNOSIS — M542 Cervicalgia: Secondary | ICD-10-CM

## 2021-09-17 DIAGNOSIS — R252 Cramp and spasm: Secondary | ICD-10-CM

## 2021-09-17 NOTE — Therapy (Signed)
[place]. [place], [place], [address] Phone: [phone]   Fax:  [phone]  Physical Therapy Treatment  Patient Details  Name: Ruth [name] MRN: [id number] Date of Birth: 29-Aug-1946 Referring Provider (PT): Perini   Encounter Date: 09/17/2021   PT End of Session - 09/17/21 0916     Visit Number 13    Date for PT Re-Evaluation 10/07/21    Authorization Type UHC Medicare    PT Start Time 0840    PT Stop Time 0925    PT Time Calculation (min) 45 min    Activity Tolerance Patient tolerated treatment well;Patient limited by pain    Behavior During Therapy [facility] for tasks assessed/performed             Past Medical History:  Diagnosis Date   Anxiety    CAD (coronary artery disease)    a. CABG x 4 in 2006 in [place] (LIMA->LAD, VG->Diag, VG->OM, VG->RCA); b. DES to midbody of SVG-PDA/PLA 07/2012; c. 03/2015 Myoview: EF 65%, small defect of mild severit in basal inferolateral wall, low risk.   Complication of anesthesia    "quit breathing when I got ?Inovar" (07/28/2012)   Depression    Diverticulitis    Diverticulosis    Dyslipidemia    Intolerant of statins   Fecal incontinence    GERD (gastroesophageal reflux disease)    Hyperlipidemia    Hyperplastic colon polyp    IBS (irritable bowel syndrome)    Labile hypertension    a. 09/2016 Renal Artery duplex: nl renal arteries.   Migraines    "had them in my 32's" (07/28/2012)   Multiple allergies    Obesity    Steatohepatitis    Type II diabetes mellitus ([place])    a. no meds, diet controlled - takes cinnamon.    Past Surgical History:  Procedure Laterality Date   ABDOMINAL HYSTERECTOMY  1970's   BREAST LUMPECTOMY     bilateral   CARDIAC CATHETERIZATION  2006   CORONARY ANGIOPLASTY WITH STENT PLACEMENT  07/28/2012   "1; first time for me" (07/28/2012)   CORONARY ARTERY BYPASS GRAFT  2006   CABG X4   CORONARY STENT INTERVENTION N/A  03/01/2017   Procedure: Coronary Stent Intervention;  Surgeon: Troy Sine, MD;  Location: [place] CV LAB;  Service: Cardiovascular;  Laterality: N/A;   CORONARY STENT INTERVENTION N/A 06/23/2019   Procedure: CORONARY STENT INTERVENTION;  Surgeon: Martinique, Peter M, MD;  Location: [place] CV LAB;  Service: Cardiovascular;  Laterality: N/A;   EXCISIONAL HEMORRHOIDECTOMY  1970's   INGUINAL HERNIA REPAIR  1970's?   left   LEFT HEART CATH AND CORS/GRAFTS ANGIOGRAPHY N/A 03/01/2017   Procedure: Left Heart Cath and Cors/Grafts Angiography;  Surgeon: Troy Sine, MD;  Location: [place] CV LAB;  Service: Cardiovascular;  Laterality: N/A;   LEFT HEART CATH AND CORS/GRAFTS ANGIOGRAPHY N/A 06/22/2019   Procedure: LEFT HEART CATH AND CORS/GRAFTS ANGIOGRAPHY;  Surgeon: Belva Crome, MD;  Location: [place] CV LAB;  Service: Cardiovascular;  Laterality: N/A;   LEFT HEART CATHETERIZATION WITH CORONARY/GRAFT ANGIOGRAM N/A 07/28/2012   Procedure: LEFT HEART CATHETERIZATION WITH Beatrix Fetters;  Surgeon: Burnell Blanks, MD;  Location: [facility] CATH LAB;  Service: Cardiovascular;  Laterality: N/A;   PERCUTANEOUS CORONARY STENT INTERVENTION (PCI-S)  07/28/2012   Procedure: PERCUTANEOUS CORONARY STENT INTERVENTION (PCI-S);  Surgeon: Burnell Blanks, MD;  Location: [facility] CATH LAB;  Service: Cardiovascular;;   TONSILLECTOMY AND ADENOIDECTOMY  ~  1951    There were no vitals filed for this visit.   Subjective Assessment - 09/17/21 0844     Subjective Patient reports thta she was doing better but over the last day or so she has had more pain with some pain in the left neck and upper trap    Currently in Pain? Yes    Pain Score 6     Pain Location Neck    Pain Descriptors / Indicators Sore;Spasm                               [facility] Adult PT Treatment/Exercise - 09/17/21 0001       Moist Heat Therapy   Number Minutes Moist Heat 10 Minutes    Moist Heat  Location Cervical      Electrical Stimulation   Electrical Stimulation Location bilateral cervical and upper trap areas    Electrical Stimulation Action IFC    Electrical Stimulation Parameters sitting    Electrical Stimulation Goals Pain      Manual Therapy   Manual Therapy Soft tissue mobilization;Manual Traction    Manual therapy comments DTM over bil upper traps, levator scapulae, sub-occipitals    Soft tissue mobilization to the right cervical area, right upper trap and right upper arm, to the left cervical and upper trap area    Manual Traction cervical with her sitting                       PT Short Term Goals - 08/11/21 0905       PT SHORT TERM GOAL #1   Title pt will be I with inital HEP    Status Achieved               PT Long Term Goals - 09/17/21 0920       PT LONG TERM GOAL #1   Title pt will be I with all advanced HEP    Status On-going      PT LONG TERM GOAL #2   Title decrease pain overall 50%    Status Partially Met      PT LONG TERM GOAL #3   Title report 50% less pain with driving    Status On-going      PT LONG TERM GOAL #4   Title increase cervical ROM 25%    Status Partially Met                   Plan - 09/17/21 0916     Clinical Impression Statement Patient continues to struggle with other health issues, this seems to be causing some stress and she know is having some pain in the left upper trap and neck area, this area has incresaed tightness and tenderness today.  She reports good relief with the STM and the manual traction, she has health issues besides the IBS that does not allow much exercise or certain positions, like lying down to try the traction unit.    PT Next Visit Plan will try to get her doing a few more exercises she will usually want to stop due to bowel issues    Consulted and Agree with Plan of Care Patient             Patient will benefit from skilled therapeutic intervention in order to  improve the following deficits and impairments:  Decreased range of motion, Increased muscle spasms, Pain, Improper body  mechanics, Decreased strength, Decreased mobility, Postural dysfunction  Visit Diagnosis: Cervicalgia  Cramp and spasm  Radiculopathy, cervical region     Problem List Patient Active Problem List   Diagnosis Date Noted   Dizziness 12/24/2020   Multiple drug allergies 12/24/2020   Dysuria 01/17/2020   Non-ST elevation (NSTEMI) myocardial infarction [facility])    Chest pain 06/21/2019   IBS (irritable bowel syndrome) 06/21/2019   Hyperbilirubinemia 06/21/2019   GERD (gastroesophageal reflux disease) 06/21/2019   Low back pain 05/11/2019   Atopic dermatitis 04/03/2019   Other specified health status 08/10/2018   Encounter for general adult medical examination without abnormal findings 06/15/2018   Noninflammatory disorder of vagina 06/13/2018   Arthralgia of right temporomandibular joint 10/28/2017   Bilateral impacted cerumen 10/28/2017   Chronic otitis externa of left ear 10/28/2017   Frequency of micturition 09/28/2017   Benign neoplasm of colon 08/24/2017   Spinal stenosis of lumbar region 08/24/2017   Fall 04/27/2017   Abnormal nuclear stress test    Other stressful life events affecting family and household 01/26/2017   Diverticula of intestine 09/28/2016   Eructation 09/28/2016   Tinea unguium 06/24/2016   Neck pain 03/12/2016   Hypertensive heart disease 05/25/2015   Hyperglycemia due to type 2 diabetes mellitus ([place]) 03/22/2015   Pure hypercholesterolemia 03/22/2015   Type II or unspecified type diabetes mellitus without mention of complication, uncontrolled 02/14/2014   Hyponatremia 02/13/2014   HTN (hypertension) 02/13/2014   Sepsis secondary to UTI ([place]) 02/12/2014   Acute pyelonephritis 02/12/2014   Pain in left leg 11/13/2013   Candidiasis 09/12/2013   Impingement syndrome of left shoulder region 02/14/2013   Spasm 12/14/2012   Unstable  angina (HCC) 07/29/2012   Obesity (BMI 30-39.9) 07/29/2012   Chronic ulcer of skin ([place]) 06/16/2012   Hemorrhoids 06/16/2012   Bloating 06/15/2012   Generalized abdominal pain 06/15/2012   Family history of colon cancer 06/15/2012   Breast lump 01/11/2012   Microscopic hematuria 01/11/2012   Myalgia 11/24/2011   Malignant HTN with heart disease, w/o CHF, 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 [place], [place], [address] Phone: [phone]   Fax:  [phone]  Name: Ruth [name] MRN: [id number] Date of Birth: Nov 30, 1945

## 2021-09-24 ENCOUNTER — Telehealth: Payer: Self-pay | Admitting: Cardiovascular Disease

## 2021-09-24 NOTE — Telephone Encounter (Signed)
New Message:     Patient says she legs are hurting so bad, she is in so much. Also said her blood pressure goes up high at night. Patient wants to be seen this week if possible please  Pt c/o BP issue: STAT if pt c/o blurred vision, one-sided weakness or slurred speech  1. What are your last 5 BP readings? 203/93 last night this morning it was 150/70   2. Are you having any other symptoms (ex. Dizziness, headache, blurred vision, passed out)? little chest pain, legs are hurting so bad  3. What is your BP issue? Pressure is high at night    .

## 2021-09-24 NOTE — Telephone Encounter (Signed)
Called and spoke with the patient to get more information. She states that she took an Epsom bath and her legs feel better now. She stated that her legs typically hurt when she has elevated blood pressure. Blood pressure while on the phone was 150/70. She currently denies any symptoms.  She takes: Amlodipine 30m and 2.5 mg at night. She will take an extra 2.5 mg for elevated blood pressure per Dr. KEvette Georgesinstructions. Metoprolol 100 mg bid Valsartan 160 mg bid  She does not take Irbesartan. This has been taken off of her list.   She would like an appointment next week to be seen but has been advised that we do not have any openings. [facility] and [facility] an appointment with [facility]d.   She has an appointment with Dr. KClaiborne Billingson 1/31. She has been advised to keep a log of her blood pressure, one to two hours after taking her medication and to watch her sodium intake. She will call back if her pressures remain high and she becomes symptomatic.

## 2021-09-24 NOTE — Telephone Encounter (Signed)
Returned call to pt she states that her legs have been hurting (but may be this is arthritis) denies any swelling. Denies any increased salt intake she states that she does not know why they are hurting she has not done anything different. "Maybe it is just the cold weather". Also she states that her BP has been running high. She informs me that she has been taking her medications as ordered. She did state that she did think that she had some chest pain, she did not take her nitro("it was not that bad"), but decided to take a Tums and states that the CP went away at that point. She states that her BP has been "a little high for the last few days" Her BP today is 150/70, last night it was 203/91 HR 68 after taking her amlodipine 166/62 then later 152/65. Monday 150/70 in the morning. Monday afternoon 166/83 HR 64 then in the PM 192/89 HR 62. Informed pt that Dr Claiborne Billings is on vacation until Monday 1-23. No appt's are available today, informed pt to go to the ER.pt states that she WILL NOT go to the ER as to her son being decreased immune system and she will not get exposed like that! She states that she will call her PCP and discuss LE swelling and BP. I will forward to DOD for further direction.

## 2021-09-29 ENCOUNTER — Ambulatory Visit: Payer: Medicare Other | Admitting: Physical Therapy

## 2021-09-29 ENCOUNTER — Encounter: Payer: Self-pay | Admitting: Physical Therapy

## 2021-09-29 ENCOUNTER — Other Ambulatory Visit: Payer: Self-pay

## 2021-09-29 DIAGNOSIS — M542 Cervicalgia: Secondary | ICD-10-CM

## 2021-09-29 DIAGNOSIS — M5412 Radiculopathy, cervical region: Secondary | ICD-10-CM

## 2021-09-29 DIAGNOSIS — R252 Cramp and spasm: Secondary | ICD-10-CM

## 2021-09-29 NOTE — Therapy (Signed)
[facility] [place], [place], [address] Phone: [phone]   Fax:  [phone]  Physical Therapy Treatment  Patient Details  Name: Ruth [name] MRN: [id number] Date of Birth: Jan 18, 1946 Referring Provider (PT): Perini   Encounter Date: 09/29/2021   PT End of Session - 09/29/21 0907     Visit Number 14    Date for PT Re-Evaluation 10/07/21    Authorization Type UHC Medicare    PT Start Time 0838    PT Stop Time 0925    PT Time Calculation (min) 47 min    Activity Tolerance Patient tolerated treatment well;Patient limited by pain    Behavior During Therapy [facility] for tasks assessed/performed             Past Medical History:  Diagnosis Date   Anxiety    CAD (coronary artery disease)    a. CABG x 4 in 2006 in [place] (LIMA->LAD, VG->Diag, VG->OM, VG->RCA); b. DES to midbody of SVG-PDA/PLA 07/2012; c. 03/2015 Myoview: EF 65%, small defect of mild severit in basal inferolateral wall, low risk.   Complication of anesthesia    "quit breathing when I got ?Inovar" (07/28/2012)   Depression    Diverticulitis    Diverticulosis    Dyslipidemia    Intolerant of statins   Fecal incontinence    GERD (gastroesophageal reflux disease)    Hyperlipidemia    Hyperplastic colon polyp    IBS (irritable bowel syndrome)    Labile hypertension    a. 09/2016 Renal Artery duplex: nl renal arteries.   Migraines    "had them in my 96's" (07/28/2012)   Multiple allergies    Obesity    Steatohepatitis    Type II diabetes mellitus ([place])    a. no meds, diet controlled - takes cinnamon.    Past Surgical History:  Procedure Laterality Date   ABDOMINAL HYSTERECTOMY  1970's   BREAST LUMPECTOMY     bilateral   CARDIAC CATHETERIZATION  2006   CORONARY ANGIOPLASTY WITH STENT PLACEMENT  07/28/2012   "1; first time for me" (07/28/2012)   CORONARY ARTERY BYPASS GRAFT  2006   CABG X4   CORONARY STENT INTERVENTION N/A  03/01/2017   Procedure: Coronary Stent Intervention;  Surgeon: Troy Sine, MD;  Location: [facility];  Service: Cardiovascular;  Laterality: N/A;   CORONARY STENT INTERVENTION N/A 06/23/2019   Procedure: CORONARY STENT INTERVENTION;  Surgeon: Martinique, Peter M, MD;  Location: [place] [facility];  Service: Cardiovascular;  Laterality: N/A;   EXCISIONAL HEMORRHOIDECTOMY  1970's   INGUINAL HERNIA REPAIR  1970's?   left   LEFT HEART CATH AND CORS/GRAFTS ANGIOGRAPHY N/A 03/01/2017   Procedure: Left Heart Cath and Cors/Grafts Angiography;  Surgeon: Troy Sine, MD;  Location: [facility];  Service: Cardiovascular;  Laterality: N/A;   LEFT HEART CATH AND CORS/GRAFTS ANGIOGRAPHY N/A 06/22/2019   Procedure: LEFT HEART CATH AND CORS/GRAFTS ANGIOGRAPHY;  Surgeon: Belva Crome, MD;  Location: [place] [facility];  Service: Cardiovascular;  Laterality: N/A;   LEFT HEART CATHETERIZATION WITH CORONARY/GRAFT ANGIOGRAM N/A 07/28/2012   Procedure: LEFT HEART CATHETERIZATION WITH Beatrix Fetters;  Surgeon: Burnell Blanks, MD;  Location: [facility] CATH LAB;  Service: Cardiovascular;  Laterality: N/A;   PERCUTANEOUS CORONARY STENT INTERVENTION (PCI-S)  07/28/2012   Procedure: PERCUTANEOUS CORONARY STENT INTERVENTION (PCI-S);  Surgeon: Burnell Blanks, MD;  Location: [facility] C[facility] LAB;  Service: Cardiovascular;;   TONSILLECTOMY AND ADENOIDECTOMY  ~  1951    There were no vitals filed for this visit.   Subjective Assessment - 09/29/21 0841     Subjective Patient reports that she was doing a good deed yesterday, She tripped on uneven sidewalk, hit the house and reports bruising on the left chest, pain in both thighs and reports a twist that hurt her back    Currently in Pain? Yes    Pain Score 8     Pain Location Neck   thighs and back lower   Pain Descriptors / Indicators Sore    Pain Type Acute pain    Aggravating Factors  falling                                [facility] Adult PT Treatment/Exercise - 09/29/21 0001       Moist Heat Therapy   Number Minutes Moist Heat 10 Minutes    Moist Heat Location Cervical;Lumbar Spine      Electrical Stimulation   Electrical Stimulation Location cervical and lumbar    Electrical Stimulation Action premod    Electrical Stimulation Parameters sitting    Electrical Stimulation Goals Pain      Manual Therapy   Manual Therapy Soft tissue mobilization;Manual Traction    Manual therapy comments DTM over bil upper traps, levator scapulae, sub-occipitals    Soft tissue mobilization to the right cervical area, right upper trap and right upper arm, to the left cervical and upper trap area    Manual Traction cervical with her sitting                       PT Short Term Goals - 08/11/21 0905       PT SHORT TERM GOAL #1   Title pt will be I with inital HEP    Status Achieved               PT Long Term Goals - 09/17/21 0920       PT LONG TERM GOAL #1   Title pt will be I with all advanced HEP    Status On-going      PT LONG TERM GOAL #2   Title decrease pain overall 50%    Status Partially Met      PT LONG TERM GOAL #3   Title report 50% less pain with driving    Status On-going      PT LONG TERM GOAL #4   Title increase cervical ROM 25%    Status Partially Met                   Plan - 09/29/21 0908     Clinical Impression Statement Patient reports a fall yesterday, reports that she twisted her back, strained her legs and hit her left side on the wall.  She reports that she is very sore and cannot do much today, she reports significant brusing of the left side and tightness in the low back and the thighs.  She is very tight and tender and had difficulty with any motions of the back, neck and arms    PT Next Visit Plan see if we can get the pain and tightness calmed down from the fall she took yesterday    Consulted and Agree with Plan  of Care Patient             Patient will benefit from skilled therapeutic intervention in  order to improve the following deficits and impairments:  Decreased range of motion, Increased muscle spasms, Pain, Improper body mechanics, Decreased strength, Decreased mobility, Postural dysfunction  Visit Diagnosis: Cervicalgia  Cramp and spasm  Radiculopathy, cervical region     Problem List Patient Active Problem List   Diagnosis Date Noted   Dizziness 12/24/2020   Multiple drug allergies 12/24/2020   Dysuria 01/17/2020   Non-ST elevation (NSTEMI) myocardial infarction [facility])    Chest pain 06/21/2019   IBS (irritable bowel syndrome) 06/21/2019   Hyperbilirubinemia 06/21/2019   GERD (gastroesophageal reflux disease) 06/21/2019   Low back pain 05/11/2019   Atopic dermatitis 04/03/2019   Other specified health status 08/10/2018   Encounter for general adult medical examination without abnormal findings 06/15/2018   Noninflammatory disorder of vagina 06/13/2018   Arthralgia of right temporomandibular joint 10/28/2017   Bilateral impacted cerumen 10/28/2017   Chronic otitis externa of left ear 10/28/2017   Frequency of micturition 09/28/2017   Benign neoplasm of colon 08/24/2017   Spinal stenosis of lumbar region 08/24/2017   Fall 04/27/2017   Abnormal nuclear stress test    Other stressful life events affecting family and household 01/26/2017   Diverticula of intestine 09/28/2016   Eructation 09/28/2016   Tinea unguium 06/24/2016   Neck pain 03/12/2016   Hypertensive heart disease 05/25/2015   Hyperglycemia due to type 2 diabetes mellitus ([place]) 03/22/2015   Pure hypercholesterolemia 03/22/2015   Type II or unspecified type diabetes mellitus without mention of complication, uncontrolled 02/14/2014   Hyponatremia 02/13/2014   HTN (hypertension) 02/13/2014   Sepsis secondary to UTI ([place]) 02/12/2014   Acute pyelonephritis 02/12/2014   Pain in left leg 11/13/2013    Candidiasis 09/12/2013   Impingement syndrome of left shoulder region 02/14/2013   Spasm 12/14/2012   Unstable angina (HCC) 07/29/2012   Obesity (BMI 30-39.9) 07/29/2012   Chronic ulcer of skin ([place]) 06/16/2012   Hemorrhoids 06/16/2012   Bloating 06/15/2012   Generalized abdominal pain 06/15/2012   Family history of colon cancer 06/15/2012   Breast lump 01/11/2012   Microscopic hematuria 01/11/2012   Myalgia 11/24/2011   Malignant HTN with heart disease, w/o CHF, 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 [place], [place], [address] Phone: [phone]   Fax:  [phone]  Name: JERENE YEAGER MRN: [id number] Date of Birth: 1946/05/15

## 2021-10-02 ENCOUNTER — Ambulatory Visit: Payer: Medicare Other | Admitting: Physical Therapy

## 2021-10-03 ENCOUNTER — Ambulatory Visit: Payer: Medicare Other | Admitting: Physical Therapy

## 2021-10-03 ENCOUNTER — Other Ambulatory Visit: Payer: Self-pay

## 2021-10-03 ENCOUNTER — Encounter: Payer: Self-pay | Admitting: Physical Therapy

## 2021-10-03 DIAGNOSIS — R252 Cramp and spasm: Secondary | ICD-10-CM

## 2021-10-03 DIAGNOSIS — M542 Cervicalgia: Secondary | ICD-10-CM

## 2021-10-03 DIAGNOSIS — M5412 Radiculopathy, cervical region: Secondary | ICD-10-CM

## 2021-10-03 NOTE — Therapy (Signed)
[facility]. [place], [place], [address] Phone: [phone]   Fax:  [phone]  Physical Therapy Treatment  Patient Details  Name: Ruth [name] MRN: [id number] Date of Birth: 21-Aug-1946 Referring Provider (PT): Perini   Encounter Date: 10/03/2021   PT End of Session - 10/03/21 0914     Visit Number 15    Date for PT Re-Evaluation 10/07/21    Authorization Type UHC Medicare    PT Start Time [id number]    PT Stop Time 0930    PT Time Calculation (min) 52 min    Activity Tolerance Patient tolerated treatment well;Patient limited by pain    Behavior During Therapy [facility] for tasks assessed/performed             Past Medical History:  Diagnosis Date   Anxiety    CAD (coronary artery disease)    a. CABG x 4 in 2006 in [place] (LIMA->LAD, VG->Diag, VG->OM, VG->RCA); b. DES to midbody of SVG-PDA/PLA 07/2012; c. 03/2015 Myoview: EF 65%, small defect of mild severit in basal inferolateral wall, low risk.   Complication of anesthesia    "quit breathing when I got ?Inovar" (07/28/2012)   Depression    Diverticulitis    Diverticulosis    Dyslipidemia    Intolerant of statins   Fecal incontinence    GERD (gastroesophageal reflux disease)    Hyperlipidemia    Hyperplastic colon polyp    IBS (irritable bowel syndrome)    Labile hypertension    a. 09/2016 Renal Artery duplex: nl renal arteries.   Migraines    "had them in my 17's" (07/28/2012)   Multiple allergies    Obesity    Steatohepatitis    Type II diabetes mellitus ([place])    a. no meds, diet controlled - takes cinnamon.    Past Surgical History:  Procedure Laterality Date   ABDOMINAL HYSTERECTOMY  1970's   BREAST LUMPECTOMY     bilateral   CARDIAC CATHETERIZATION  2006   CORONARY ANGIOPLASTY WITH STENT PLACEMENT  07/28/2012   "1; first time for me" (07/28/2012)   CORONARY ARTERY BYPASS GRAFT  2006   CABG X4   CORONARY STENT INTERVENTION N/A  03/01/2017   Procedure: Coronary Stent Intervention;  Surgeon: Troy Sine, MD;  Location: [facility];  Service: Cardiovascular;  Laterality: N/A;   CORONARY STENT INTERVENTION N/A 06/23/2019   Procedure: CORONARY STENT INTERVENTION;  Surgeon: Martinique, Peter M, MD;  Location: [facility];  Service: Cardiovascular;  Laterality: N/A;   EXCISIONAL HEMORRHOIDECTOMY  1970's   INGUINAL HERNIA REPAIR  1970's?   left   LEFT HEART CATH AND CORS/GRAFTS ANGIOGRAPHY N/A 03/01/2017   Procedure: Left Heart Cath and Cors/Grafts Angiography;  Surgeon: Troy Sine, MD;  Location: [facility];  Service: Cardiovascular;  Laterality: N/A;   LEFT HEART CATH AND CORS/GRAFTS ANGIOGRAPHY N/A 06/22/2019   Procedure: LEFT HEART CATH AND CORS/GRAFTS ANGIOGRAPHY;  Surgeon: Belva Crome, MD;  Location: [facility];  Service: Cardiovascular;  Laterality: N/A;   LEFT HEART CATHETERIZATION WITH CORONARY/GRAFT ANGIOGRAM N/A 07/28/2012   Procedure: LEFT HEART CATHETERIZATION WITH Beatrix Fetters;  Surgeon: Burnell Blanks, MD;  Location: [facility] CATH LAB;  Service: Cardiovascular;  Laterality: N/A;   PERCUTANEOUS CORONARY STENT INTERVENTION (PCI-S)  07/28/2012   Procedure: PERCUTANEOUS CORONARY STENT INTERVENTION (PCI-S);  Surgeon: Burnell Blanks, MD;  Location: [facility] CATH LAB;  Service: Cardiovascular;;   TONSILLECTOMY AND ADENOIDECTOMY  ~  1951    There were no vitals filed for this visit.   Subjective Assessment - 10/03/21 0913     Subjective i saw the MD, he thought I was doing a little better, I am hurting in my thighs from the cholesterol medicine.    Currently in Pain? Yes    Pain Score 7     Pain Location Neck   thighs   Aggravating Factors  falls and the cholesterol medicine                               [facility] Adult PT Treatment/Exercise - 10/03/21 0001       Neck Exercises: Seated   Other Seated Exercise shrugs, rolls, ankle rolls,  marches and LAQ's      Manual Therapy   Manual Therapy Soft tissue mobilization;Manual Traction    Manual therapy comments DTM over bil upper traps, levator scapulae, sub-occipitals    Soft tissue mobilization to the right cervical area, right upper trap and right upper arm, to the left cervical and upper trap area, vibration to the thighs    Manual Traction cervical with her sitting                       PT Short Term Goals - 08/11/21 0905       PT SHORT TERM GOAL #1   Title pt will be I with inital HEP    Status Achieved               PT Long Term Goals - 10/03/21 0916       PT LONG TERM GOAL #1   Title pt will be I with all advanced HEP    Status On-going      PT LONG TERM GOAL #5   Title understand posture and body mechanics    Status Partially Met                   Plan - 10/03/21 0915     Clinical Impression Statement Patient is still hurting in the low back and the thighs, saw MD , he reported that we could treat her for this, still significant spasms in the upper traps and neck area.  She was able to do some of the easy sitting exercises with pain    PT Next Visit Plan see if we can get the pain and tightness calmed down from the fall she took yesterday    Consulted and Agree with Plan of Care Patient             Patient will benefit from skilled therapeutic intervention in order to improve the following deficits and impairments:  Decreased range of motion, Increased muscle spasms, Pain, Improper body mechanics, Decreased strength, Decreased mobility, Postural dysfunction  Visit Diagnosis: Cervicalgia  Cramp and spasm  Radiculopathy, cervical region     Problem List Patient Active Problem List   Diagnosis Date Noted   Dizziness 12/24/2020   Multiple drug allergies 12/24/2020   Dysuria 01/17/2020   Non-ST elevation (NSTEMI) myocardial infarction [facility])    Chest pain 06/21/2019   IBS (irritable bowel syndrome) 06/21/2019    Hyperbilirubinemia 06/21/2019   GERD (gastroesophageal reflux disease) 06/21/2019   Low back pain 05/11/2019   Atopic dermatitis 04/03/2019   Other specified health status 08/10/2018   Encounter for general adult medical examination without abnormal findings 06/15/2018   Noninflammatory disorder of vagina  06/13/2018   Arthralgia of right temporomandibular joint 10/28/2017   Bilateral impacted cerumen 10/28/2017   Chronic otitis externa of left ear 10/28/2017   Frequency of micturition 09/28/2017   Benign neoplasm of colon 08/24/2017   Spinal stenosis of lumbar region 08/24/2017   Fall 04/27/2017   Abnormal nuclear stress test    Other stressful life events affecting family and household 01/26/2017   Diverticula of intestine 09/28/2016   Eructation 09/28/2016   Tinea unguium 06/24/2016   Neck pain 03/12/2016   Hypertensive heart disease 05/25/2015   Hyperglycemia due to type 2 diabetes mellitus ([place]) 03/22/2015   Pure hypercholesterolemia 03/22/2015   Type II or unspecified type diabetes mellitus without mention of complication, uncontrolled 02/14/2014   Hyponatremia 02/13/2014   HTN (hypertension) 02/13/2014   Sepsis secondary to UTI ([place]) 02/12/2014   Acute pyelonephritis 02/12/2014   Pain in left leg 11/13/2013   Candidiasis 09/12/2013   Impingement syndrome of left shoulder region 02/14/2013   Spasm 12/14/2012   Unstable angina ([facility]CC) 07/29/2012   Obesity (BMI 30-39.9) 07/29/2012   Chronic ulcer of skin ([place]) 06/16/2012   Hemorrhoids 06/16/2012   Bloating 06/15/2012   Generalized abdominal pain 06/15/2012   Family history of colon cancer 06/15/2012   Breast lump 01/11/2012   Microscopic hematuria 01/11/2012   Myalgia 11/24/2011   Malignant HTN with heart disease, w/o CHF, 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 [place], [place], [address] Phone: [phone]   Fax:  [phone]  Name: Ruth [name] MRN: [id number] Date of Birth: [birth year]

## 2021-10-06 ENCOUNTER — Ambulatory Visit: Payer: Medicare Other | Admitting: Physical Therapy

## 2021-10-06 ENCOUNTER — Encounter: Payer: Self-pay | Admitting: Physical Therapy

## 2021-10-06 ENCOUNTER — Other Ambulatory Visit: Payer: Self-pay

## 2021-10-06 DIAGNOSIS — R252 Cramp and spasm: Secondary | ICD-10-CM

## 2021-10-06 DIAGNOSIS — M542 Cervicalgia: Secondary | ICD-10-CM | POA: Diagnosis not present

## 2021-10-06 DIAGNOSIS — M5412 Radiculopathy, cervical region: Secondary | ICD-10-CM

## 2021-10-06 NOTE — Therapy (Signed)
[facility]. [place], [place], [address] Phone: [phone]   Fax:  [phone]  Physical Therapy Treatment  Patient Details  Name: Ruth [name] MRN: [id number] Date of Birth: 12/30/1945 Referring Provider (PT): Perini   Encounter Date: 10/06/2021   PT End of Session - 10/06/21 0911     Visit Number 16    Date for PT Re-Evaluation 11/05/21    Authorization Type UHC Medicare    PT Start Time 0836    PT Stop Time 0920    PT Time Calculation (min) 44 min    Activity Tolerance Patient tolerated treatment well;Patient limited by pain    Behavior During Therapy [place] [place] for tasks assessed/performed             Past Medical History:  Diagnosis Date   Anxiety    CAD (coronary artery disease)    a. CABG x 4 in 2006 in [place] (LIMA->LAD, VG->Diag, VG->OM, VG->RCA); b. DES to midbody of SVG-PDA/PLA 07/2012; c. 03/2015 Myoview: EF 65%, small defect of mild severit in basal inferolateral wall, low risk.   Complication of anesthesia    "quit breathing when I got ?Inovar" (07/28/2012)   Depression    Diverticulitis    Diverticulosis    Dyslipidemia    Intolerant of statins   Fecal incontinence    GERD (gastroesophageal reflux disease)    Hyperlipidemia    Hyperplastic colon polyp    IBS (irritable bowel syndrome)    Labile hypertension    a. 09/2016 Renal Artery duplex: nl renal arteries.   Migraines    "had them in my 64's" (07/28/2012)   Multiple allergies    Obesity    Steatohepatitis    Type II diabetes mellitus ([place])    a. no meds, diet controlled - takes cinnamon.    Past Surgical History:  Procedure Laterality Date   ABDOMINAL HYSTERECTOMY  1970's   BREAST LUMPECTOMY     bilateral   CARDIAC CATHETERIZATION  2006   CORONARY ANGIOPLASTY WITH STENT PLACEMENT  07/28/2012   "1; first time for me" (07/28/2012)   CORONARY ARTERY BYPASS GRAFT  2006   CABG X4   CORONARY STENT INTERVENTION N/A  03/01/2017   Procedure: Coronary Stent Intervention;  Surgeon: Troy Sine, MD;  Location: [facility];  Service: Cardiovascular;  Laterality: N/A;   CORONARY STENT INTERVENTION N/A 06/23/2019   Procedure: CORONARY STENT INTERVENTION;  Surgeon: Martinique, Peter M, MD;  Location: [facility];  Service: Cardiovascular;  Laterality: N/A;   EXCISIONAL HEMORRHOIDECTOMY  1970's   INGUINAL HERNIA REPAIR  1970's?   left   LEFT HEART CATH AND CORS/GRAFTS ANGIOGRAPHY N/A 03/01/2017   Procedure: Left Heart Cath and Cors/Grafts Angiography;  Surgeon: Troy Sine, MD;  Location: [facility];  Service: Cardiovascular;  Laterality: N/A;   LEFT HEART CATH AND CORS/GRAFTS ANGIOGRAPHY N/A 06/22/2019   Procedure: LEFT HEART CATH AND CORS/GRAFTS ANGIOGRAPHY;  Surgeon: Belva Crome, MD;  Location: [facility];  Service: Cardiovascular;  Laterality: N/A;   LEFT HEART CATHETERIZATION WITH CORONARY/GRAFT ANGIOGRAM N/A 07/28/2012   Procedure: LEFT HEART CATHETERIZATION WITH Beatrix Fetters;  Surgeon: Burnell Blanks, MD;  Location: [facility] C[facility];  Service: Cardiovascular;  Laterality: N/A;   PERCUTANEOUS CORONARY STENT INTERVENTION (PCI-S)  07/28/2012   Procedure: PERCUTANEOUS CORONARY STENT INTERVENTION (PCI-S);  Surgeon: Burnell Blanks, MD;  Location: [facility] CATH LAB;  Service: Cardiovascular;;   TONSILLECTOMY AND ADENOIDECTOMY  ~  1951    There were no vitals filed for this visit.   Subjective Assessment - 10/06/21 0908     Subjective Patietn has been having some increased pain since she took a fall into the wall, she reports bruising of the left chest wall, a strain of the back mms and the legs.  We have been seeing her for neck pain and spasm.  She has been improving with better motions and less pain but this fall has set her back and now having more pain in other areas    Currently in Pain? Yes    Pain Score 7     Pain Location Neck   low back, left chest wall  and legs   Pain Relieving Factors the treatment here helps me                               [facility] Adult PT Treatment/Exercise - 10/06/21 0001       Neck Exercises: Seated   Neck Retraction 10 reps    Shoulder Shrugs 20 reps    Shoulder Rolls 20 reps    Shoulder Flexion Both;15 reps    Other Seated Exercise ankle circles, marching, LAQ's and toe and heel raises all 20x      Manual Therapy   Manual Therapy Soft tissue mobilization;Manual Traction    Manual therapy comments DTM over bil upper traps, levator scapulae, sub-occipitals    Soft tissue mobilization to the right cervical area, right upper trap and right upper arm, to the left cervical and upper trap area, vibration to the thighs    Manual Traction cervical with her sitting                       PT Short Term Goals - 08/11/21 0905       PT SHORT TERM GOAL #1   Title pt will be I with inital HEP    Status Achieved               PT Long Term Goals - 10/06/21 0914       PT LONG TERM GOAL #1   Title pt will be I with all advanced HEP    Status On-going      PT LONG TERM GOAL #2   Title decrease pain overall 50%    Status Partially Met      PT LONG TERM GOAL #3   Title report 50% less pain with driving    Status On-going      PT LONG TERM GOAL #4   Title increase cervical ROM 25%    Status Partially Met      PT LONG TERM GOAL #5   Title understand posture and body mechanics    Status Partially Met                   Plan - 10/06/21 0912     Clinical Impression Statement Patient had been doing well and was making good progress with the neck pain , spasms and loss of ROM, she had a fall about 2 weeks ago, she reports increased neck pain, pain in the back, left chest and the LE's, she reports that she feels tha tshe twisted and hit the wall and just strained all of her mms.  She does have some difficulty with walking now a little slower and a more shuffling type  pattern  PT Duration 8 weeks    PT Treatment/Interventions ADLs/Self Care Home Management;Electrical Stimulation;Moist Heat;Traction;Ultrasound;Therapeutic activities;Therapeutic exercise;Neuromuscular re-education;Manual techniques;Patient/family education;Dry needling    PT Next Visit Plan see if we can get the pain and movement restored to prior to fall level    Consulted and Agree with Plan of Care Patient             Patient will benefit from skilled therapeutic intervention in order to improve the following deficits and impairments:  Decreased range of motion, Increased muscle spasms, Pain, Improper body mechanics, Decreased strength, Decreased mobility, Postural dysfunction  Visit Diagnosis: Cervicalgia - Plan: PT plan of care cert/re-cert  Cramp and spasm - Plan: PT plan of care cert/re-cert  Radiculopathy, cervical region - Plan: PT plan of care cert/re-cert     Problem List Patient Active Problem List   Diagnosis Date Noted   Dizziness 12/24/2020   Multiple drug allergies 12/24/2020   Dysuria 01/17/2020   Non-ST elevation (NSTEMI) myocardial infarction [facility])    Chest pain 06/21/2019   IBS (irritable bowel syndrome) 06/21/2019   Hyperbilirubinemia 06/21/2019   GERD (gastroesophageal reflux disease) 06/21/2019   Low back pain 05/11/2019   Atopic dermatitis 04/03/2019   Other specified health status 08/10/2018   Encounter for general adult medical examination without abnormal findings 06/15/2018   Noninflammatory disorder of vagina 06/13/2018   Arthralgia of right temporomandibular joint 10/28/2017   Bilateral impacted cerumen 10/28/2017   Chronic otitis externa of left ear 10/28/2017   Frequency of micturition 09/28/2017   Benign neoplasm of colon 08/24/2017   Spinal stenosis of lumbar region 08/24/2017   Fall 04/27/2017   Abnormal nuclear stress test    Other stressful life events affecting family and household 01/26/2017   Diverticula of intestine  09/28/2016   Eructation 09/28/2016   Tinea unguium 06/24/2016   Neck pain 03/12/2016   Hypertensive heart disease 05/25/2015   Hyperglycemia due to type 2 diabetes mellitus ([place]) 03/22/2015   Pure hypercholesterolemia 03/22/2015   Type II or unspecified type diabetes mellitus without mention of complication, uncontrolled 02/14/2014   Hyponatremia 02/13/2014   HTN (hypertension) 02/13/2014   Sepsis secondary to UTI ([place]) 02/12/2014   Acute pyelonephritis 02/12/2014   Pain in left leg 11/13/2013   Candidiasis 09/12/2013   Impingement syndrome of left shoulder region 02/14/2013   Spasm 12/14/2012   Unstable angina (HCC) 07/29/2012   Obesity (BMI 30-39.9) 07/29/2012   Chronic ulcer of skin ([place]) 06/16/2012   Hemorrhoids 06/16/2012   Bloating 06/15/2012   Generalized abdominal pain 06/15/2012   Family history of colon cancer 06/15/2012   Breast lump 01/11/2012   Microscopic hematuria 01/11/2012   Myalgia 11/24/2011   Malignant HTN with heart disease, w/o CHF, 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 [place], [place], [address] Phone: [phone]   Fax:  [phone]  Name: CALYNN FERRERO MRN: [id number] Date of Birth: [birth year]

## 2021-10-07 ENCOUNTER — Ambulatory Visit: Payer: Medicare Other | Admitting: Cardiovascular Disease

## 2021-10-08 ENCOUNTER — Ambulatory Visit: Payer: Medicare Other | Attending: Neurological Surgery | Admitting: Physical Therapy

## 2021-10-08 ENCOUNTER — Encounter: Payer: Self-pay | Admitting: Physical Therapy

## 2021-10-08 ENCOUNTER — Other Ambulatory Visit: Payer: Self-pay

## 2021-10-08 DIAGNOSIS — M542 Cervicalgia: Secondary | ICD-10-CM

## 2021-10-08 DIAGNOSIS — R252 Cramp and spasm: Secondary | ICD-10-CM | POA: Diagnosis present

## 2021-10-08 DIAGNOSIS — M5412 Radiculopathy, cervical region: Secondary | ICD-10-CM | POA: Diagnosis present

## 2021-10-08 DIAGNOSIS — R262 Difficulty in walking, not elsewhere classified: Secondary | ICD-10-CM | POA: Diagnosis present

## 2021-10-08 NOTE — Therapy (Signed)
[name] [place], [place], [address] Phone: [phone]   Fax:  [phone]  Physical Therapy Treatment  Patient Details  Name: Ruth [name] MRN: [id number] Date of Birth: [birth year] Referring Provider (PT): Perini   Encounter Date: 10/08/2021   PT End of Session - 10/08/21 0925     Visit Number 17    Date for PT Re-Evaluation 11/05/21    Authorization Type UHC Medicare    PT Start Time [phone]    PT Stop Time 0930    PT Time Calculation (min) 49 min    Activity Tolerance Patient tolerated treatment well;Patient limited by pain    Behavior During Therapy [facility] for tasks assessed/performed             Past Medical History:  Diagnosis Date   Anxiety    CAD (coronary artery disease)    a. CABG x 4 in 2006 in [place] (LIMA->LAD, VG->Diag, VG->OM, VG->RCA); b. DES to midbody of SVG-PDA/PLA 07/2012; c. 03/2015 Myoview: EF 65%, small defect of mild severit in basal inferolateral wall, low risk.   Complication of anesthesia    "quit breathing when I got ?Inovar" (07/28/2012)   Depression    Diverticulitis    Diverticulosis    Dyslipidemia    Intolerant of statins   Fecal incontinence    GERD (gastroesophageal reflux disease)    Hyperlipidemia    Hyperplastic colon polyp    IBS (irritable bowel syndrome)    Labile hypertension    a. 09/2016 Renal Artery duplex: nl renal arteries.   Migraines    "had them in my 25's" (07/28/2012)   Multiple allergies    Obesity    Steatohepatitis    Type II diabetes mellitus ([place])    a. no meds, diet controlled - takes cinnamon.    Past Surgical History:  Procedure Laterality Date   ABDOMINAL HYSTERECTOMY  1970's   BREAST LUMPECTOMY     bilateral   CARDIAC CATHETERIZATION  2006   CORONARY ANGIOPLASTY WITH STENT PLACEMENT  07/28/2012   "1; first time for me" (07/28/2012)   CORONARY ARTERY BYPASS GRAFT  2006   CABG X4   CORONARY STENT INTERVENTION N/A  03/01/2017   Procedure: Coronary Stent Intervention;  Surgeon: Troy Sine, MD;  Location: [facility];  Service: Cardiovascular;  Laterality: N/A;   CORONARY STENT INTERVENTION N/A 06/23/2019   Procedure: CORONARY STENT INTERVENTION;  Surgeon: Martinique, Peter M, MD;  Location: [place];  Service: Cardiovascular;  Laterality: N/A;   EXCISIONAL HEMORRHOIDECTOMY  1970's   INGUINAL HERNIA REPAIR  1970's?   left   LEFT HEART CATH AND CORS/GRAFTS ANGIOGRAPHY N/A 03/01/2017   Procedure: Left Heart Cath and Cors/Grafts Angiography;  Surgeon: Troy Sine, MD;  Location: [place] [facility];  Service: Cardiovascular;  Laterality: N/A;   LEFT HEART CATH AND CORS/GRAFTS ANGIOGRAPHY N/A 06/22/2019   Procedure: LEFT HEART CATH AND CORS/GRAFTS ANGIOGRAPHY;  Surgeon: Belva Crome, MD;  Location: [place] [facility];  Service: Cardiovascular;  Laterality: N/A;   LEFT HEART CATHETERIZATION WITH CORONARY/GRAFT ANGIOGRAM N/A 07/28/2012   Procedure: LEFT HEART CATHETERIZATION WITH Beatrix Fetters;  Surgeon: Burnell Blanks, MD;  Location: [facility] CATH LAB;  Service: Cardiovascular;  Laterality: N/A;   PERCUTANEOUS CORONARY STENT INTERVENTION (PCI-S)  07/28/2012   Procedure: PERCUTANEOUS CORONARY STENT INTERVENTION (PCI-S);  Surgeon: Burnell Blanks, MD;  Location: [facility] CATH LAB;  Service: Cardiovascular;;   TONSILLECTOMY AND ADENOIDECTOMY  ~  1951    There were no vitals filed for this visit.   Subjective Assessment - 10/08/21 0846     Subjective Patient reports doing some more walking with the dog, feeling better and stronger at times, still very tired and painful    Currently in Pain? Yes    Pain Score 6     Pain Location Neck   legs                              [facility] Adult PT Treatment/Exercise - 10/08/21 0001       Neck Exercises: Seated   Neck Retraction 10 reps    Shoulder Shrugs 20 reps    Shoulder Rolls 20 reps    Shoulder Flexion  Both;15 reps    Other Seated Exercise ankle circles, marching, LAQ's and toe and heel raises all 20x      Manual Therapy   Manual Therapy Soft tissue mobilization;Manual Traction    Manual therapy comments DTM over bil upper traps, levator scapulae, sub-occipitals    Soft tissue mobilization to the right cervical area, right upper trap and right upper arm, to the left cervical and upper trap area, vibration to the thighs    Manual Traction cervical with her sitting                       PT Short Term Goals - 08/11/21 0905       PT SHORT TERM GOAL #1   Title pt will be I with inital HEP    Status Achieved               PT Long Term Goals - 10/06/21 0914       PT LONG TERM GOAL #1   Title pt will be I with all advanced HEP    Status On-going      PT LONG TERM GOAL #2   Title decrease pain overall 50%    Status Partially Met      PT LONG TERM GOAL #3   Title report 50% less pain with driving    Status On-going      PT LONG TERM GOAL #4   Title increase cervical ROM 25%    Status Partially Met      PT LONG TERM GOAL #5   Title understand posture and body mechanics    Status Partially Met                   Plan - 10/08/21 0925     Clinical Impression Statement Patient continues to recover from the fall she had, she is still very sore in the LE's and ankles. She has been able to do some exercises to get her mms moving and loosen up.  The upper traps and neck remain very tight    PT Next Visit Plan see if we can get the pain and movement restored to prior to fall level    Consulted and Agree with Plan of Care Patient             Patient will benefit from skilled therapeutic intervention in order to improve the following deficits and impairments:  Decreased range of motion, Increased muscle spasms, Pain, Improper body mechanics, Decreased strength, Decreased mobility, Postural dysfunction  Visit Diagnosis: Cervicalgia  Cramp and  spasm  Radiculopathy, cervical region     Problem List Patient Active Problem List   Diagnosis  Date Noted   Dizziness 12/24/2020   Multiple drug allergies 12/24/2020   Dysuria 01/17/2020   Non-ST elevation (NSTEMI) myocardial infarction [facility])    Chest pain 06/21/2019   IBS (irritable bowel syndrome) 06/21/2019   Hyperbilirubinemia 06/21/2019   GERD (gastroesophageal reflux disease) 06/21/2019   Low back pain 05/11/2019   Atopic dermatitis 04/03/2019   Other specified health status 08/10/2018   Encounter for general adult medical examination without abnormal findings 06/15/2018   Noninflammatory disorder of vagina 06/13/2018   Arthralgia of right temporomandibular joint 10/28/2017   Bilateral impacted cerumen 10/28/2017   Chronic otitis externa of left ear 10/28/2017   Frequency of micturition 09/28/2017   Benign neoplasm of colon 08/24/2017   Spinal stenosis of lumbar region 08/24/2017   Fall 04/27/2017   Abnormal nuclear stress test    Other stressful life events affecting family and household 01/26/2017   Diverticula of intestine 09/28/2016   Eructation 09/28/2016   Tinea unguium 06/24/2016   Neck pain 03/12/2016   Hypertensive heart disease 05/25/2015   Hyperglycemia due to type 2 diabetes mellitus ([place]) 03/22/2015   Pure hypercholesterolemia 03/22/2015   Type II or unspecified type diabetes mellitus without mention of complication, uncontrolled 02/14/2014   Hyponatremia 02/13/2014   HTN (hypertension) 02/13/2014   Sepsis secondary to UTI ([place]) 02/12/2014   Acute pyelonephritis 02/12/2014   Pain in left leg 11/13/2013   Candidiasis 09/12/2013   Impingement syndrome of left shoulder region 02/14/2013   Spasm 12/14/2012   Unstable angina (HCC) 07/29/2012   Obesity (BMI 30-39.9) 07/29/2012   Chronic ulcer of skin ([place]) 06/16/2012   Hemorrhoids 06/16/2012   Bloating 06/15/2012   Generalized abdominal pain 06/15/2012   Family history of colon cancer 06/15/2012    Breast lump 01/11/2012   Microscopic hematuria 01/11/2012   Myalgia 11/24/2011   Malignant HTN with heart disease, w/o CHF, 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 [place], [place], [address] Phone: [phone]   Fax:  [phone]  Name: Ruth [name] MRN: [id number] Date of Birth: Feb 10, 1946

## 2021-10-13 ENCOUNTER — Other Ambulatory Visit: Payer: Self-pay

## 2021-10-13 ENCOUNTER — Encounter: Payer: Self-pay | Admitting: Physical Therapy

## 2021-10-13 ENCOUNTER — Ambulatory Visit: Payer: Medicare Other | Admitting: Physical Therapy

## 2021-10-13 DIAGNOSIS — R252 Cramp and spasm: Secondary | ICD-10-CM

## 2021-10-13 DIAGNOSIS — M5412 Radiculopathy, cervical region: Secondary | ICD-10-CM

## 2021-10-13 DIAGNOSIS — M542 Cervicalgia: Secondary | ICD-10-CM

## 2021-10-13 NOTE — Therapy (Signed)
[place]. [place], [place], [address] Phone: [phone]   Fax:  [phone]  Physical Therapy Treatment  Patient Details  Name: Ruth [name] MRN: [id number] Date of Birth: [birth year] Referring Provider (PT): Perini   Encounter Date: 10/13/2021   PT End of Session - 10/13/21 0909     Visit Number 18    Date for PT Re-Evaluation 11/05/21    Authorization Type UHC Medicare    PT Start Time 0838    PT Stop Time 0923    PT Time Calculation (min) 45 min    Activity Tolerance Patient tolerated treatment well;Patient limited by pain    Behavior During Therapy [facility] for tasks assessed/performed             Past Medical History:  Diagnosis Date   Anxiety    CAD (coronary artery disease)    a. CABG x 4 in 2006 in [place] (LIMA->LAD, VG->Diag, VG->OM, VG->RCA); b. DES to midbody of SVG-PDA/PLA 07/2012; c. 03/2015 Myoview: EF 65%, small defect of mild severit in basal inferolateral wall, low risk.   Complication of anesthesia    "quit breathing when I got ?Inovar" (07/28/2012)   Depression    Diverticulitis    Diverticulosis    Dyslipidemia    Intolerant of statins   Fecal incontinence    GERD (gastroesophageal reflux disease)    Hyperlipidemia    Hyperplastic colon polyp    IBS (irritable bowel syndrome)    Labile hypertension    a. 09/2016 Renal Artery duplex: nl renal arteries.   Migraines    "had them in my 60's" (07/28/2012)   Multiple allergies    Obesity    Steatohepatitis    Type II diabetes mellitus ([employer])    a. no meds, diet controlled - takes cinnamon.    Past Surgical History:  Procedure Laterality Date   ABDOMINAL HYSTERECTOMY  1970's   BREAST LUMPECTOMY     bilateral   CARDIAC CATHETERIZATION  2006   CORONARY ANGIOPLASTY WITH STENT PLACEMENT  07/28/2012   "1; first time for me" (07/28/2012)   CORONARY ARTERY BYPASS GRAFT  2006   CABG X4   CORONARY STENT INTERVENTION N/A  03/01/2017   Procedure: Coronary Stent Intervention;  Surgeon: Troy Sine, MD;  Location: [place] [facility];  Service: Cardiovascular;  Laterality: N/A;   CORONARY STENT INTERVENTION N/A 06/23/2019   Procedure: CORONARY STENT INTERVENTION;  Surgeon: Martinique, Peter M, MD;  Location: [place] [facility];  Service: Cardiovascular;  Laterality: N/A;   EXCISIONAL HEMORRHOIDECTOMY  1970's   INGUINAL HERNIA REPAIR  1970's?   left   LEFT HEART CATH AND CORS/GRAFTS ANGIOGRAPHY N/A 03/01/2017   Procedure: Left Heart Cath and Cors/Grafts Angiography;  Surgeon: Troy Sine, MD;  Location: [place] [facility];  Service: Cardiovascular;  Laterality: N/A;   LEFT HEART CATH AND CORS/GRAFTS ANGIOGRAPHY N/A 06/22/2019   Procedure: LEFT HEART CATH AND CORS/GRAFTS ANGIOGRAPHY;  Surgeon: Belva Crome, MD;  Location: [place] [facility];  Service: Cardiovascular;  Laterality: N/A;   LEFT HEART CATHETERIZATION WITH CORONARY/GRAFT ANGIOGRAM N/A 07/28/2012   Procedure: LEFT HEART CATHETERIZATION WITH Beatrix Fetters;  Surgeon: Burnell Blanks, MD;  Location: [facility] CATH LAB;  Service: Cardiovascular;  Laterality: N/A;   PERCUTANEOUS CORONARY STENT INTERVENTION (PCI-S)  07/28/2012   Procedure: PERCUTANEOUS CORONARY STENT INTERVENTION (PCI-S);  Surgeon: Burnell Blanks, MD;  Location: [facility] CATH LAB;  Service: Cardiovascular;;   TONSILLECTOMY AND ADENOIDECTOMY  ~  1951    There were no vitals filed for this visit.   Subjective Assessment - 10/13/21 0835     Subjective Reports that she is doing better overall, has some stress with son having some medical tests today    Currently in Pain? Yes    Pain Score 6     Pain Location Neck   thighs and back   Pain Descriptors / Indicators Sore;Spasm;Tightness    Aggravating Factors  stress                               [facility] Adult PT Treatment/Exercise - 10/13/21 0001       Neck Exercises: Machines for Strengthening   UBE (Upper  Arm Bike) level 1 x 5 minutes      Neck Exercises: Theraband   Shoulder Extension 20 reps;Red    Rows 20 reps;Red    Shoulder External Rotation 20 reps;Red    Horizontal ABduction 20 reps;Red      Manual Therapy   Manual Therapy Soft tissue mobilization;Manual Traction    Manual therapy comments DTM over bil upper traps, levator scapulae, sub-occipitals    Soft tissue mobilization to the right cervical area, right upper trap and right upper arm, to the left cervical and upper trap area, vibration to the thighs    Manual Traction cervical with her sitting                       PT Short Term Goals - 08/11/21 0905       PT SHORT TERM GOAL #1   Title pt will be I with inital HEP    Status Achieved               PT Long Term Goals - 10/06/21 0914       PT LONG TERM GOAL #1   Title pt will be I with all advanced HEP    Status On-going      PT LONG TERM GOAL #2   Title decrease pain overall 50%    Status Partially Met      PT LONG TERM GOAL #3   Title report 50% less pain with driving    Status On-going      PT LONG TERM GOAL #4   Title increase cervical ROM 25%    Status Partially Met      PT LONG TERM GOAL #5   Title understand posture and body mechanics    Status Partially Met                   Plan - 10/13/21 0910     Clinical Impression Statement Patient reports some stress with her son having some medical tests today, she reports that she has been able to do more with her dog and that she is happy that the pain is less for a few days after our treatment.  She is still very tight in the mms and is hesitant to do much exercise due to IBS type situation    PT Next Visit Plan continue to work on the pain and work on her tolerance to activity    Consulted and Agree with Plan of Care Patient             Patient will benefit from skilled therapeutic intervention in order to improve the following deficits and impairments:  Decreased  range of motion, Increased muscle spasms,  Pain, Improper body mechanics, Decreased strength, Decreased mobility, Postural dysfunction  Visit Diagnosis: Cervicalgia  Cramp and spasm  Radiculopathy, cervical region     Problem List Patient Active Problem List   Diagnosis Date Noted   Dizziness 12/24/2020   Multiple drug allergies 12/24/2020   Dysuria 01/17/2020   Non-ST elevation (NSTEMI) myocardial infarction [facility])    Chest pain 06/21/2019   IBS (irritable bowel syndrome) 06/21/2019   Hyperbilirubinemia 06/21/2019   GERD (gastroesophageal reflux disease) 06/21/2019   Low back pain 05/11/2019   Atopic dermatitis 04/03/2019   Other specified health status 08/10/2018   Encounter for general adult medical examination without abnormal findings 06/15/2018   Noninflammatory disorder of vagina 06/13/2018   Arthralgia of right temporomandibular joint 10/28/2017   Bilateral impacted cerumen 10/28/2017   Chronic otitis externa of left ear 10/28/2017   Frequency of micturition 09/28/2017   Benign neoplasm of colon 08/24/2017   Spinal stenosis of lumbar region 08/24/2017   Fall 04/27/2017   Abnormal nuclear stress test    Other stressful life events affecting family and household 01/26/2017   Diverticula of intestine 09/28/2016   Eructation 09/28/2016   Tinea unguium 06/24/2016   Neck pain 03/12/2016   Hypertensive heart disease 05/25/2015   Hyperglycemia due to type 2 diabetes mellitus ([place]) 03/22/2015   Pure hypercholesterolemia 03/22/2015   Type II or unspecified type diabetes mellitus without mention of complication, uncontrolled 02/14/2014   Hyponatremia 02/13/2014   HTN (hypertension) 02/13/2014   Sepsis secondary to UTI ([place]) 02/12/2014   Acute pyelonephritis 02/12/2014   Pain in left leg 11/13/2013   Candidiasis 09/12/2013   Impingement syndrome of left shoulder region 02/14/2013   Spasm 12/14/2012   Unstable angina (HCC) 07/29/2012   Obesity (BMI 30-39.9) 07/29/2012    Chronic ulcer of skin ([place]) 06/16/2012   Hemorrhoids 06/16/2012   Bloating 06/15/2012   Generalized abdominal pain 06/15/2012   Family history of colon cancer 06/15/2012   Breast lump 01/11/2012   Microscopic hematuria 01/11/2012   Myalgia 11/24/2011   Malignant HTN with heart disease, w/o CHF, 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 [place], [place], [address] Phone: [phone]   Fax:  [phone]  Name: Ruth [name] MRN: [id number] Date of Birth: 1946-08-26

## 2021-10-15 ENCOUNTER — Encounter: Payer: Self-pay | Admitting: Physical Therapy

## 2021-10-15 ENCOUNTER — Ambulatory Visit: Payer: Medicare Other | Admitting: Physical Therapy

## 2021-10-15 ENCOUNTER — Other Ambulatory Visit: Payer: Self-pay

## 2021-10-15 DIAGNOSIS — M542 Cervicalgia: Secondary | ICD-10-CM | POA: Diagnosis not present

## 2021-10-15 DIAGNOSIS — R252 Cramp and spasm: Secondary | ICD-10-CM

## 2021-10-15 DIAGNOSIS — M5412 Radiculopathy, cervical region: Secondary | ICD-10-CM

## 2021-10-15 NOTE — Therapy (Signed)
[place]. [place], [place], [address] Phone: [phone]   Fax:  [phone]  Physical Therapy Treatment  Patient Details  Name: Ruth [name] MRN: [id number] Date of Birth: 10-28-1945 Referring Provider (PT): Perini   Encounter Date: 10/15/2021   PT End of Session - 10/15/21 0919     Visit Number 19    Date for PT Re-Evaluation 11/05/21    Authorization Type UHC Medicare    PT Start Time 0839    PT Stop Time 0928    PT Time Calculation (min) 49 min    Activity Tolerance Patient tolerated treatment well;Patient limited by pain    Behavior During Therapy [facility] for tasks assessed/performed             Past Medical History:  Diagnosis Date   Anxiety    CAD (coronary artery disease)    a. CABG x 4 in 2006 in [place] (LIMA->LAD, VG->Diag, VG->OM, VG->RCA); b. DES to midbody of SVG-PDA/PLA 07/2012; c. 03/2015 Myoview: EF 65%, small defect of mild severit in basal inferolateral wall, low risk.   Complication of anesthesia    "quit breathing when I got ?Inovar" (07/28/2012)   Depression    Diverticulitis    Diverticulosis    Dyslipidemia    Intolerant of statins   Fecal incontinence    GERD (gastroesophageal reflux disease)    Hyperlipidemia    Hyperplastic colon polyp    IBS (irritable bowel syndrome)    Labile hypertension    a. 09/2016 Renal Artery duplex: nl renal arteries.   Migraines    "had them in my 4's" (07/28/2012)   Multiple allergies    Obesity    Steatohepatitis    Type II diabetes mellitus ([place])    a. no meds, diet controlled - takes cinnamon.    Past Surgical History:  Procedure Laterality Date   ABDOMINAL HYSTERECTOMY  1970's   BREAST LUMPECTOMY     bilateral   CARDIAC CATHETERIZATION  2006   CORONARY ANGIOPLASTY WITH STENT PLACEMENT  07/28/2012   "1; first time for me" (07/28/2012)   CORONARY ARTERY BYPASS GRAFT  2006   CABG X4   CORONARY STENT INTERVENTION N/A  03/01/2017   Procedure: Coronary Stent Intervention;  Surgeon: Troy Sine, MD;  Location: [place] [facility];  Service: Cardiovascular;  Laterality: N/A;   CORONARY STENT INTERVENTION N/A 06/23/2019   Procedure: CORONARY STENT INTERVENTION;  Surgeon: Martinique, Peter M, MD;  Location: [place] [facility];  Service: Cardiovascular;  Laterality: N/A;   EXCISIONAL HEMORRHOIDECTOMY  1970's   INGUINAL HERNIA REPAIR  1970's?   left   LEFT HEART CATH AND CORS/GRAFTS ANGIOGRAPHY N/A 03/01/2017   Procedure: Left Heart Cath and Cors/Grafts Angiography;  Surgeon: Troy Sine, MD;  Location: [place] [facility];  Service: Cardiovascular;  Laterality: N/A;   LEFT HEART CATH AND CORS/GRAFTS ANGIOGRAPHY N/A 06/22/2019   Procedure: LEFT HEART CATH AND CORS/GRAFTS ANGIOGRAPHY;  Surgeon: Belva Crome, MD;  Location: [place] [facility];  Service: Cardiovascular;  Laterality: N/A;   LEFT HEART CATHETERIZATION WITH CORONARY/GRAFT ANGIOGRAM N/A 07/28/2012   Procedure: LEFT HEART CATHETERIZATION WITH Beatrix Fetters;  Surgeon: Burnell Blanks, MD;  Location: [facility] CATH LAB;  Service: Cardiovascular;  Laterality: N/A;   PERCUTANEOUS CORONARY STENT INTERVENTION (PCI-S)  07/28/2012   Procedure: PERCUTANEOUS CORONARY STENT INTERVENTION (PCI-S);  Surgeon: Burnell Blanks, MD;  Location: [facility] LAB;  Service: Cardiovascular;;   TONSILLECTOMY AND ADENOIDECTOMY  ~  1951    There were no vitals filed for this visit.   Subjective Assessment - 10/15/21 0837     Subjective Patient reports that some things are getting better and then she gets some pain and it really sets her back    Currently in Pain? Yes    Pain Score 5     Pain Location Back    Pain Relieving Factors the treatment helps                               [facility] Adult PT Treatment/Exercise - 10/15/21 0001       Manual Therapy   Manual Therapy Soft tissue mobilization;Manual Traction    Manual therapy comments  DTM over bil upper traps, levator scapulae, sub-occipitals    Soft tissue mobilization to the right cervical area, right upper trap and right upper arm, to the left cervical and upper trap area, vibration to the thighs    Passive ROM to the right shoulder    Manual Traction cervical with her sitting                       PT Short Term Goals - 08/11/21 0905       PT SHORT TERM GOAL #1   Title pt will be I with inital HEP    Status Achieved               PT Long Term Goals - 10/15/21 0933       PT LONG TERM GOAL #2   Title decrease pain overall 50%    Status Partially Met      PT LONG TERM GOAL #3   Title report 50% less pain with driving    Status Partially Met                   Plan - 10/15/21 0919     Clinical Impression Statement Patient having more pain on the left neck area today.  She remains tight and with very tender areas in the upper traps, the cervical mms and the rhomboids.  She has been limited with wanting to exercise with c/o the bowel issues.    PT Next Visit Plan continue to work on the pain and work on her tolerance to activity    Consulted and Agree with Plan of Care Patient             Patient will benefit from skilled therapeutic intervention in order to improve the following deficits and impairments:  Decreased range of motion, Increased muscle spasms, Pain, Improper body mechanics, Decreased strength, Decreased mobility, Postural dysfunction  Visit Diagnosis: Cervicalgia  Cramp and spasm  Radiculopathy, cervical region     Problem List Patient Active Problem List   Diagnosis Date Noted   Dizziness 12/24/2020   Multiple drug allergies 12/24/2020   Dysuria 01/17/2020   Non-ST elevation (NSTEMI) myocardial infarction [facility])    Chest pain 06/21/2019   IBS (irritable bowel syndrome) 06/21/2019   Hyperbilirubinemia 06/21/2019   GERD (gastroesophageal reflux disease) 06/21/2019   Low back pain 05/11/2019    Atopic dermatitis 04/03/2019   Other specified health status 08/10/2018   Encounter for general adult medical examination without abnormal findings 06/15/2018   Noninflammatory disorder of vagina 06/13/2018   Arthralgia of right temporomandibular joint 10/28/2017   Bilateral impacted cerumen 10/28/2017   Chronic otitis externa of left ear 10/28/2017   Frequency  of micturition 09/28/2017   Benign neoplasm of colon 08/24/2017   Spinal stenosis of lumbar region 08/24/2017   Fall 04/27/2017   Abnormal nuclear stress test    Other stressful life events affecting family and household 01/26/2017   Diverticula of intestine 09/28/2016   Eructation 09/28/2016   Tinea unguium 06/24/2016   Neck pain 03/12/2016   Hypertensive heart disease 05/25/2015   Hyperglycemia due to type 2 diabetes mellitus ([place]) 03/22/2015   Pure hypercholesterolemia 03/22/2015   Type II or unspecified type diabetes mellitus without mention of complication, uncontrolled 02/14/2014   Hyponatremia 02/13/2014   HTN (hypertension) 02/13/2014   Sepsis secondary to UTI ([place]) 02/12/2014   Acute pyelonephritis 02/12/2014   Pain in left leg 11/13/2013   Candidiasis 09/12/2013   Impingement syndrome of left shoulder region 02/14/2013   Spasm 12/14/2012   Unstable angina (HCC) 07/29/2012   Obesity (BMI 30-39.9) 07/29/2012   Chronic ulcer of skin ([place]) 06/16/2012   Hemorrhoids 06/16/2012   Bloating 06/15/2012   Generalized abdominal pain 06/15/2012   Family history of colon cancer 06/15/2012   Breast lump 01/11/2012   Microscopic hematuria 01/11/2012   Myalgia 11/24/2011   Malignant HTN with heart disease, w/o CHF, 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 12/30/2010   Coronary artery disease 12/30/2010   Hyperlipidemia with target LDL less than 70 12/30/2010   Diabetes mellitus ([place]) 12/30/2010   Enthesopathy of hip region 11/29/2009   RECTAL INCONTINENCE 06/13/2008    Sumner Boast, PT 10/15/2021, 9:34 AM  [place]. [place], [place], [address] Phone: [phone]   Fax:  [phone]  Name: Ruth [name] MRN: [id number] Date of Birth: 1945-09-19

## 2021-10-20 ENCOUNTER — Ambulatory Visit: Payer: Medicare Other | Admitting: Physical Therapy

## 2021-10-22 ENCOUNTER — Ambulatory Visit: Payer: Medicare Other | Admitting: Physical Therapy

## 2021-10-22 ENCOUNTER — Encounter: Payer: Self-pay | Admitting: Physical Therapy

## 2021-10-22 ENCOUNTER — Other Ambulatory Visit: Payer: Self-pay

## 2021-10-22 DIAGNOSIS — R252 Cramp and spasm: Secondary | ICD-10-CM

## 2021-10-22 DIAGNOSIS — M542 Cervicalgia: Secondary | ICD-10-CM

## 2021-10-22 DIAGNOSIS — M5412 Radiculopathy, cervical region: Secondary | ICD-10-CM

## 2021-10-22 NOTE — Therapy (Signed)
[address] [place], [place], [address] Phone: [phone]   Fax:  [phone] Progress Note Reporting Period 09/05/21 to 10/22/21 for visits 11-20  See note below for Objective Data and Assessment of Progress/Goals.     Physical Therapy Treatment  Patient Details  Name: Ruth [name] MRN: [id number] Date of Birth: 01-05-1946 Referring Provider (Ruth [name]): Perini   Encounter Date: 10/22/2021   Ruth [name] End of Session - 10/22/21 0920     Visit Number 20    Date for Ruth [name] Re-Evaluation 11/05/21    Authorization Type UHC Medicare    Ruth [name] Start Time 0835    Ruth [name] Stop Time 0920    Ruth [name] Time Calculation (min) 45 min    Activity Tolerance Patient tolerated treatment well;Patient limited by pain    Behavior During Therapy [facility] for tasks assessed/performed             Past Medical History:  Diagnosis Date   Anxiety    CAD (coronary artery disease)    a. CABG x 4 in 2006 in [place] (LIMA->LAD, VG->Diag, VG->OM, VG->RCA); b. DES to midbody of SVG-PDA/PLA 07/2012; c. 03/2015 Myoview: EF 65%, small defect of mild severit in basal inferolateral wall, low risk.   Complication of anesthesia    "quit breathing when I got ?Inovar" (07/28/2012)   Depression    Diverticulitis    Diverticulosis    Dyslipidemia    Intolerant of statins   Fecal incontinence    GERD (gastroesophageal reflux disease)    Hyperlipidemia    Hyperplastic colon polyp    IBS (irritable bowel syndrome)    Labile hypertension    a. 09/2016 Renal Artery duplex: nl renal arteries.   Migraines    "had them in my 5's" (07/28/2012)   Multiple allergies    Obesity    Steatohepatitis    Type II diabetes mellitus ([place])    a. no meds, diet controlled - takes cinnamon.    Past Surgical History:  Procedure Laterality Date   ABDOMINAL HYSTERECTOMY  1970's   BREAST LUMPECTOMY     bilateral   CARDIAC CATHETERIZATION  2006   CORONARY ANGIOPLASTY WITH STENT  PLACEMENT  07/28/2012   "1; first time for me" (07/28/2012)   CORONARY ARTERY BYPASS GRAFT  2006   CABG X4   CORONARY STENT INTERVENTION N/A 03/01/2017   Procedure: Coronary Stent Intervention;  Surgeon: Troy Sine, MD;  Location: [place] [facility] LAB;  Service: Cardiovascular;  Laterality: N/A;   CORONARY STENT INTERVENTION N/A 06/23/2019   Procedure: CORONARY STENT INTERVENTION;  Surgeon: Martinique, Peter M, MD;  Location: [place] CV LAB;  Service: Cardiovascular;  Laterality: N/A;   EXCISIONAL HEMORRHOIDECTOMY  1970's   INGUINAL HERNIA REPAIR  1970's?   left   LEFT HEART CATH AND CORS/GRAFTS ANGIOGRAPHY N/A 03/01/2017   Procedure: Left Heart Cath and Cors/Grafts Angiography;  Surgeon: Troy Sine, MD;  Location: [place] CV LAB;  Service: Cardiovascular;  Laterality: N/A;   LEFT HEART CATH AND CORS/GRAFTS ANGIOGRAPHY N/A 06/22/2019   Procedure: LEFT HEART CATH AND CORS/GRAFTS ANGIOGRAPHY;  Surgeon: Belva Crome, MD;  Location: [place] CV LAB;  Service: Cardiovascular;  Laterality: N/A;   LEFT HEART CATHETERIZATION WITH CORONARY/GRAFT ANGIOGRAM N/A 07/28/2012   Procedure: LEFT HEART CATHETERIZATION WITH Beatrix Fetters;  Surgeon: Burnell Blanks, MD;  Location: [facility] CATH LAB;  Service: Cardiovascular;  Laterality: N/A;   PERCUTANEOUS CORONARY STENT INTERVENTION (PCI-S)  07/28/2012   Procedure:  PERCUTANEOUS CORONARY STENT INTERVENTION (PCI-S);  Surgeon: Burnell Blanks, MD;  Location: [facility] CATH LAB;  Service: Cardiovascular;;   TONSILLECTOMY AND ADENOIDECTOMY  ~ 1951    There were no vitals filed for this visit.   Subjective Assessment - 10/22/21 0837     Subjective She reports that she went to get her taxes done yesterday and had to wait over two hours, reports that she has increased pain in the legs, ankles, neck and shoulders    Currently in Pain? Yes    Pain Score 7     Pain Location Neck    Pain Descriptors / Indicators Sore;Aching;Tightness     Aggravating Factors  sitting, stress, stairs, reports that she is having diffiuclty going up and down stairs                               [facility] Adult Ruth [name] Treatment/Exercise - 10/22/21 0001       Neck Exercises: Theraband   Shoulder Extension 20 reps;Red    Rows 20 reps;Red      Neck Exercises: Seated   Other Seated Exercise ankle circles, marching, LAQ's and toe and heel raises all 20x    Other Seated Exercise 2# stick biceps and then a small shoulder press to overhead      Manual Therapy   Soft tissue mobilization to the right cervical area, right upper trap and right upper arm, to the left cervical and upper trap area, vibration to the thighs    Passive ROM to the right shoulder, neck and ankles, toes    Manual Traction cervical with her sitting                       Ruth [name] Short Term Goals - 08/11/21 0905       Ruth [name] SHORT TERM GOAL #1   Title Ruth [name] will be I with inital HEP    Status Achieved               Ruth [name] Long Term Goals - 10/22/21 0924       Ruth [name] LONG TERM GOAL #1   Title Ruth [name] will be I with all advanced HEP    Status On-going      Ruth [name] LONG TERM GOAL #2   Title decrease pain overall 50%    Status Partially Met      Ruth [name] LONG TERM GOAL #3   Title report 50% less pain with driving    Baseline reports better motions when looking left 10/22/21    Status Partially Met                   Plan - 10/22/21 4097     Clinical Impression Statement Patient with increased pain and reports ankle and leg pain and difficulty walking up and down stairs at home, she reports that she feels it is from stress of waiting and long period yesterday to get taxes done, I added some gentle exercises and ROM to the LE's, when sitting in a chair without arm rests she needs help to get up from sitting.    Ruth [name] Next Visit Plan continue to work on the pain and work on her tolerance to activity    Consulted and Agree with Plan of Care Patient              Patient will benefit from skilled therapeutic intervention in order to improve the following deficits  and impairments:  Decreased range of motion, Increased muscle spasms, Pain, Improper body mechanics, Decreased strength, Decreased mobility, Postural dysfunction  Visit Diagnosis: Cervicalgia  Cramp and spasm  Radiculopathy, cervical region     Problem List Patient Active Problem List   Diagnosis Date Noted   Dizziness 12/24/2020   Multiple drug allergies 12/24/2020   Dysuria 01/17/2020   Non-ST elevation (NSTEMI) myocardial infarction [facility])    Chest pain 06/21/2019   IBS (irritable bowel syndrome) 06/21/2019   Hyperbilirubinemia 06/21/2019   GERD (gastroesophageal reflux disease) 06/21/2019   Low back pain 05/11/2019   Atopic dermatitis 04/03/2019   Other specified health status 08/10/2018   Encounter for general adult medical examination without abnormal findings 06/15/2018   Noninflammatory disorder of vagina 06/13/2018   Arthralgia of right temporomandibular joint 10/28/2017   Bilateral impacted cerumen 10/28/2017   Chronic otitis externa of left ear 10/28/2017   Frequency of micturition 09/28/2017   Benign neoplasm of colon 08/24/2017   Spinal stenosis of lumbar region 08/24/2017   Fall 04/27/2017   Abnormal nuclear stress test    Other stressful life events affecting family and household 01/26/2017   Diverticula of intestine 09/28/2016   Eructation 09/28/2016   Tinea unguium 06/24/2016   Neck pain 03/12/2016   Hypertensive heart disease 05/25/2015   Hyperglycemia due to type 2 diabetes mellitus ([place]) 03/22/2015   Pure hypercholesterolemia 03/22/2015   Type II or unspecified type diabetes mellitus without mention of complication, uncontrolled 02/14/2014   Hyponatremia 02/13/2014   HTN (hypertension) 02/13/2014   Sepsis secondary to UTI ([place]) 02/12/2014   Acute pyelonephritis 02/12/2014   Pain in left leg 11/13/2013   Candidiasis 09/12/2013   Impingement  syndrome of left shoulder region 02/14/2013   Spasm 12/14/2012   Unstable angina (HCC) 07/29/2012   Obesity (BMI 30-39.9) 07/29/2012   Chronic ulcer of skin ([place]) 06/16/2012   Hemorrhoids 06/16/2012   Bloating 06/15/2012   Generalized abdominal pain 06/15/2012   Family history of colon cancer 06/15/2012   Breast lump 01/11/2012   Microscopic hematuria 01/11/2012   Myalgia 11/24/2011   Malignant HTN with heart disease, w/o CHF, 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 [place], [place], [address] Phone: [phone]   Fax:  [phone]  Name: Ruth [name] MRN: [id number] Date of Birth: [birth year]

## 2021-10-27 ENCOUNTER — Ambulatory Visit: Payer: Medicare Other | Admitting: Physical Therapy

## 2021-10-27 ENCOUNTER — Other Ambulatory Visit: Payer: Self-pay

## 2021-10-27 ENCOUNTER — Encounter: Payer: Self-pay | Admitting: Physical Therapy

## 2021-10-27 DIAGNOSIS — R262 Difficulty in walking, not elsewhere classified: Secondary | ICD-10-CM

## 2021-10-27 DIAGNOSIS — M542 Cervicalgia: Secondary | ICD-10-CM | POA: Diagnosis not present

## 2021-10-27 DIAGNOSIS — R252 Cramp and spasm: Secondary | ICD-10-CM

## 2021-10-27 DIAGNOSIS — M5412 Radiculopathy, cervical region: Secondary | ICD-10-CM

## 2021-10-27 NOTE — Therapy (Signed)
[address] [place], [place], [address] Phone: [phone]   Fax:  [phone]  Physical Therapy Treatment  Patient Details  Name: Ruth [name] MRN: [id number] Date of Birth: 1946/01/16 Referring Provider (PT): Perini   Encounter Date: 10/27/2021   PT End of Session - 10/27/21 0917     Visit Number 21    Date for PT Re-Evaluation 11/05/21    Authorization Type UHC Medicare    PT Start Time 0830    PT Stop Time 0912    PT Time Calculation (min) 42 min    Activity Tolerance Patient tolerated treatment well;Patient limited by pain    Behavior During Therapy [facility] for tasks assessed/performed             Past Medical History:  Diagnosis Date   Anxiety    CAD (coronary artery disease)    a. CABG x 4 in 2006 in [place] (LIMA->LAD, VG->Diag, VG->OM, VG->RCA); b. DES to midbody of SVG-PDA/PLA 07/2012; c. 03/2015 Myoview: EF 65%, small defect of mild severit in basal inferolateral wall, low risk.   Complication of anesthesia    "quit breathing when I got ?Inovar" (07/28/2012)   Depression    Diverticulitis    Diverticulosis    Dyslipidemia    Intolerant of statins   Fecal incontinence    GERD (gastroesophageal reflux disease)    Hyperlipidemia    Hyperplastic colon polyp    IBS (irritable bowel syndrome)    Labile hypertension    a. 09/2016 Renal Artery duplex: nl renal arteries.   Migraines    "had them in my 36's" (07/28/2012)   Multiple allergies    Obesity    Steatohepatitis    Type II diabetes mellitus ([place])    a. no meds, diet controlled - takes cinnamon.    Past Surgical History:  Procedure Laterality Date   ABDOMINAL HYSTERECTOMY  1970's   BREAST LUMPECTOMY     bilateral   CARDIAC CATHETERIZATION  2006   CORONARY ANGIOPLASTY WITH STENT PLACEMENT  07/28/2012   "1; first time for me" (07/28/2012)   CORONARY ARTERY BYPASS GRAFT  2006   CABG X4   CORONARY STENT INTERVENTION N/A  03/01/2017   Procedure: Coronary Stent Intervention;  Surgeon: Troy Sine, MD;  Location: [place] CV LAB;  Service: Cardiovascular;  Laterality: N/A;   CORONARY STENT INTERVENTION N/A 06/23/2019   Procedure: CORONARY STENT INTERVENTION;  Surgeon: Martinique, Peter M, MD;  Location: [place] CV LAB;  Service: Cardiovascular;  Laterality: N/A;   EXCISIONAL HEMORRHOIDECTOMY  1970's   INGUINAL HERNIA REPAIR  1970's?   left   LEFT HEART CATH AND CORS/GRAFTS ANGIOGRAPHY N/A 03/01/2017   Procedure: Left Heart Cath and Cors/Grafts Angiography;  Surgeon: Troy Sine, MD;  Location: [place] CV LAB;  Service: Cardiovascular;  Laterality: N/A;   LEFT HEART CATH AND CORS/GRAFTS ANGIOGRAPHY N/A 06/22/2019   Procedure: LEFT HEART CATH AND CORS/GRAFTS ANGIOGRAPHY;  Surgeon: Belva Crome, MD;  Location: [place] CV LAB;  Service: Cardiovascular;  Laterality: N/A;   LEFT HEART CATHETERIZATION WITH CORONARY/GRAFT ANGIOGRAM N/A 07/28/2012   Procedure: LEFT HEART CATHETERIZATION WITH Beatrix Fetters;  Surgeon: Burnell Blanks, MD;  Location: [facility] CATH LAB;  Service: Cardiovascular;  Laterality: N/A;   PERCUTANEOUS CORONARY STENT INTERVENTION (PCI-S)  07/28/2012   Procedure: PERCUTANEOUS CORONARY STENT INTERVENTION (PCI-S);  Surgeon: Burnell Blanks, MD;  Location: [facility] CATH LAB;  Service: Cardiovascular;;   TONSILLECTOMY AND ADENOIDECTOMY  ~  1951    There were no vitals filed for this visit.   Subjective Assessment - 10/27/21 0839     Subjective reports that after the last visit she is feeling much better, walked better    Currently in Pain? Yes    Pain Score 5     Pain Location Neck    Pain Descriptors / Indicators Sore;Spasm;Tightness                               [facility] Adult PT Treatment/Exercise - 10/27/21 0001       Neck Exercises: Seated   Neck Retraction 10 reps    Cervical Rotation Both;5 reps    Cervical Rotation Limitations slight  pause and very gentle overpressure by PT    Shoulder Shrugs 20 reps    Shoulder Rolls 20 reps    Shoulder Flexion Both;15 reps    Other Seated Exercise ankle circles, marching, LAQ's and toe and heel raises all 20x    Other Seated Exercise 2# stick biceps and then a small shoulder press to overhead      Manual Therapy   Soft tissue mobilization to the right cervical area, right upper trap and right upper arm, to the left cervical and upper trap area,    Passive ROM to the right shoulder, neck and ankles, toes                       PT Short Term Goals - 08/11/21 0905       PT SHORT TERM GOAL #1   Title pt will be I with inital HEP    Status Achieved               PT Long Term Goals - 10/22/21 0924       PT LONG TERM GOAL #1   Title pt will be I with all advanced HEP    Status On-going      PT LONG TERM GOAL #2   Title decrease pain overall 50%    Status Partially Met      PT LONG TERM GOAL #3   Title report 50% less pain with driving    Baseline reports better motions when looking left 10/22/21    Status Partially Met                   Plan - 10/27/21 0955     Clinical Impression Statement Patient is reporting that she is walking better and moving better, she still has pain and limitations but seems to be responding well with the last few treatments. may need to give some LE exercises    PT Next Visit Plan may need to give her some LE exercises for HEP    Consulted and Agree with Plan of Care Patient             Patient will benefit from skilled therapeutic intervention in order to improve the following deficits and impairments:  Decreased range of motion, Increased muscle spasms, Pain, Improper body mechanics, Decreased strength, Decreased mobility, Postural dysfunction  Visit Diagnosis: Cervicalgia  Cramp and spasm  Radiculopathy, cervical region  Difficulty in walking, not elsewhere classified     Problem List Patient  Active Problem List   Diagnosis Date Noted   Dizziness 12/24/2020   Multiple drug allergies 12/24/2020   Dysuria 01/17/2020   Non-ST elevation (NSTEMI) myocardial infarction [facility])    Chest  pain 06/21/2019   IBS (irritable bowel syndrome) 06/21/2019   Hyperbilirubinemia 06/21/2019   GERD (gastroesophageal reflux disease) 06/21/2019   Low back pain 05/11/2019   Atopic dermatitis 04/03/2019   Other specified health status 08/10/2018   Encounter for general adult medical examination without abnormal findings 06/15/2018   Noninflammatory disorder of vagina 06/13/2018   Arthralgia of right temporomandibular joint 10/28/2017   Bilateral impacted cerumen 10/28/2017   Chronic otitis externa of left ear 10/28/2017   Frequency of micturition 09/28/2017   Benign neoplasm of colon 08/24/2017   Spinal stenosis of lumbar region 08/24/2017   Fall 04/27/2017   Abnormal nuclear stress test    Other stressful life events affecting family and household 01/26/2017   Diverticula of intestine 09/28/2016   Eructation 09/28/2016   Tinea unguium 06/24/2016   Neck pain 03/12/2016   Hypertensive heart disease 05/25/2015   Hyperglycemia due to type 2 diabetes mellitus ([place]) 03/22/2015   Pure hypercholesterolemia 03/22/2015   Type II or unspecified type diabetes mellitus without mention of complication, uncontrolled 02/14/2014   Hyponatremia 02/13/2014   HTN (hypertension) 02/13/2014   Sepsis secondary to UTI ([place]) 02/12/2014   Acute pyelonephritis 02/12/2014   Pain in left leg 11/13/2013   Candidiasis 09/12/2013   Impingement syndrome of left shoulder region 02/14/2013   Spasm 12/14/2012   Unstable angina (HCC) 07/29/2012   Obesity (BMI 30-39.9) 07/29/2012   Chronic ulcer of skin ([place]) 06/16/2012   Hemorrhoids 06/16/2012   Bloating 06/15/2012   Generalized abdominal pain 06/15/2012   Family history of colon cancer 06/15/2012   Breast lump 01/11/2012   Microscopic hematuria 01/11/2012   Myalgia  11/24/2011   Malignant HTN with heart disease, w/o CHF, 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 [place], [place], [address] Phone: [phone]   Fax:  [phone]  Name: Ruth [name] MRN: [id number] Date of Birth: 1946/07/18

## 2021-10-29 ENCOUNTER — Encounter: Payer: Self-pay | Admitting: Physical Therapy

## 2021-10-29 ENCOUNTER — Ambulatory Visit: Payer: Medicare Other | Admitting: Physical Therapy

## 2021-10-29 ENCOUNTER — Other Ambulatory Visit: Payer: Self-pay

## 2021-10-29 DIAGNOSIS — R252 Cramp and spasm: Secondary | ICD-10-CM

## 2021-10-29 DIAGNOSIS — R262 Difficulty in walking, not elsewhere classified: Secondary | ICD-10-CM

## 2021-10-29 DIAGNOSIS — M5412 Radiculopathy, cervical region: Secondary | ICD-10-CM

## 2021-10-29 DIAGNOSIS — M542 Cervicalgia: Secondary | ICD-10-CM

## 2021-10-29 NOTE — Therapy (Signed)
[place] [place], [place], [address] Phone: [phone]   Fax:  [phone]  Physical Therapy Treatment  Patient Details  Name: Ruth [name] MRN: [id number] Date of Birth: September 15, 1945 Referring Provider (PT): Perini   Encounter Date: 10/29/2021   PT End of Session - 10/29/21 0917     Visit Number 22    Date for PT Re-Evaluation 11/05/21    Authorization Type UHC Medicare    PT Start Time [phone]    Activity Tolerance Patient tolerated treatment well;Patient limited by pain    Behavior During Therapy [facility] for tasks assessed/performed             Past Medical History:  Diagnosis Date   Anxiety    CAD (coronary artery disease)    a. CABG x 4 in 2006 in [place] (LIMA->LAD, VG->Diag, VG->OM, VG->RCA); b. DES to midbody of SVG-PDA/PLA 07/2012; c. 03/2015 Myoview: EF 65%, small defect of mild severit in basal inferolateral wall, low risk.   Complication of anesthesia    "quit breathing when I got ?Inovar" (07/28/2012)   Depression    Diverticulitis    Diverticulosis    Dyslipidemia    Intolerant of statins   Fecal incontinence    GERD (gastroesophageal reflux disease)    Hyperlipidemia    Hyperplastic colon polyp    IBS (irritable bowel syndrome)    Labile hypertension    a. 09/2016 Renal Artery duplex: nl renal arteries.   Migraines    "had them in my 82's" (07/28/2012)   Multiple allergies    Obesity    Steatohepatitis    Type II diabetes mellitus ([place])    a. no meds, diet controlled - takes cinnamon.    Past Surgical History:  Procedure Laterality Date   ABDOMINAL HYSTERECTOMY  1970's   BREAST LUMPECTOMY     bilateral   CARDIAC CATHETERIZATION  2006   CORONARY ANGIOPLASTY WITH STENT PLACEMENT  07/28/2012   "1; first time for me" (07/28/2012)   CORONARY ARTERY BYPASS GRAFT  2006   CABG X4   CORONARY STENT INTERVENTION N/A 03/01/2017   Procedure: Coronary Stent Intervention;  Surgeon:  Troy Sine, MD;  Location: [place] [facility];  Service: Cardiovascular;  Laterality: N/A;   CORONARY STENT INTERVENTION N/A 06/23/2019   Procedure: CORONARY STENT INTERVENTION;  Surgeon: Martinique, Peter M, MD;  Location: [place] [facility];  Service: Cardiovascular;  Laterality: N/A;   EXCISIONAL HEMORRHOIDECTOMY  1970's   INGUINAL HERNIA REPAIR  1970's?   left   LEFT HEART CATH AND CORS/GRAFTS ANGIOGRAPHY N/A 03/01/2017   Procedure: Left Heart Cath and Cors/Grafts Angiography;  Surgeon: Troy Sine, MD;  Location: [place] [facility];  Service: Cardiovascular;  Laterality: N/A;   LEFT HEART CATH AND CORS/GRAFTS ANGIOGRAPHY N/A 06/22/2019   Procedure: LEFT HEART CATH AND CORS/GRAFTS ANGIOGRAPHY;  Surgeon: Belva Crome, MD;  Location: [place] [facility];  Service: Cardiovascular;  Laterality: N/A;   LEFT HEART CATHETERIZATION WITH CORONARY/GRAFT ANGIOGRAM N/A 07/28/2012   Procedure: LEFT HEART CATHETERIZATION WITH Beatrix Fetters;  Surgeon: Burnell Blanks, MD;  Location: [facility] CATH LAB;  Service: Cardiovascular;  Laterality: N/A;   PERCUTANEOUS CORONARY STENT INTERVENTION (PCI-S)  07/28/2012   Procedure: PERCUTANEOUS CORONARY STENT INTERVENTION (PCI-S);  Surgeon: Burnell Blanks, MD;  Location: [facility] CATH LAB;  Service: Cardiovascular;;   TONSILLECTOMY AND ADENOIDECTOMY  ~ 1951    There were no vitals filed for this visit.  Subjective Assessment - 10/29/21 0843     Subjective I am walking better, but I got that cholesterol shot yesterday and my legs are hurting and my neck is stiff    Currently in Pain? Yes    Pain Score 5     Pain Location Neck    Pain Relieving Factors what we have been doing is really helping                               [facility] Adult PT Treatment/Exercise - 10/29/21 0001       Neck Exercises: Seated   Neck Retraction 10 reps    Cervical Rotation Both;10 reps    Shoulder Shrugs 20 reps    Shoulder Rolls 20 reps     Other Seated Exercise ankle circles, marching, LAQ's and toe and heel raises all 20x    Other Seated Exercise 2# stick biceps and then a small shoulder press to overhead      Manual Therapy   Soft tissue mobilization to the right cervical area, right upper trap and right upper arm, to the left cervical and upper trap area,    Passive ROM to the right shoulder, neck and ankles, toes                       PT Short Term Goals - 08/11/21 0905       PT SHORT TERM GOAL #1   Title pt will be I with inital HEP    Status Achieved               PT Long Term Goals - 10/29/21 0924       PT LONG TERM GOAL #1   Title pt will be I with all advanced HEP    Status On-going      PT LONG TERM GOAL #2   Title decrease pain overall 50%    Status Partially Met      PT LONG TERM GOAL #3   Title report 50% less pain with driving    Status Partially Met                   Plan - 10/29/21 2778     Clinical Impression Statement Patient is walking better and faster, she reports that she is going up and down her stairs easier.  She did have injection for cholesterol yesterday and she reports that this usually causes some leg pain and issues.  She is reporting easier to back up the car.  She has been able to tolerate the exercises a little better, she is still having pain and tightness, palpable spasms and tightness iwht tenderness in the feet, upper traps and the cervical area.    PT Next Visit Plan may need to give her some LE exercises for HEP    Consulted and Agree with Plan of Care Patient             Patient will benefit from skilled therapeutic intervention in order to improve the following deficits and impairments:  Decreased range of motion, Increased muscle spasms, Pain, Improper body mechanics, Decreased strength, Decreased mobility, Postural dysfunction  Visit Diagnosis: Cervicalgia  Cramp and spasm  Radiculopathy, cervical region  Difficulty in  walking, not elsewhere classified     Problem List Patient Active Problem List   Diagnosis Date Noted   Dizziness 12/24/2020   Multiple drug allergies 12/24/2020  Dysuria 01/17/2020   Non-ST elevation (NSTEMI) myocardial infarction [facility])    Chest pain 06/21/2019   IBS (irritable bowel syndrome) 06/21/2019   Hyperbilirubinemia 06/21/2019   GERD (gastroesophageal reflux disease) 06/21/2019   Low back pain 05/11/2019   Atopic dermatitis 04/03/2019   Other specified health status 08/10/2018   Encounter for general adult medical examination without abnormal findings 06/15/2018   Noninflammatory disorder of vagina 06/13/2018   Arthralgia of right temporomandibular joint 10/28/2017   Bilateral impacted cerumen 10/28/2017   Chronic otitis externa of left ear 10/28/2017   Frequency of micturition 09/28/2017   Benign neoplasm of colon 08/24/2017   Spinal stenosis of lumbar region 08/24/2017   Fall 04/27/2017   Abnormal nuclear stress test    Other stressful life events affecting family and household 01/26/2017   Diverticula of intestine 09/28/2016   Eructation 09/28/2016   Tinea unguium 06/24/2016   Neck pain 03/12/2016   Hypertensive heart disease 05/25/2015   Hyperglycemia due to type 2 diabetes mellitus ([place]) 03/22/2015   Pure hypercholesterolemia 03/22/2015   Type II or unspecified type diabetes mellitus without mention of complication, uncontrolled 02/14/2014   Hyponatremia 02/13/2014   HTN (hypertension) 02/13/2014   Sepsis secondary to UTI ([place]) 02/12/2014   Acute pyelonephritis 02/12/2014   Pain in left leg 11/13/2013   Candidiasis 09/12/2013   Impingement syndrome of left shoulder region 02/14/2013   Spasm 12/14/2012   Unstable angina (HCC) 07/29/2012   Obesity (BMI 30-39.9) 07/29/2012   Chronic ulcer of skin ([place]) 06/16/2012   Hemorrhoids 06/16/2012   Bloating 06/15/2012   Generalized abdominal pain 06/15/2012   Family history of colon cancer 06/15/2012   Breast  lump 01/11/2012   Microscopic hematuria 01/11/2012   Myalgia 11/24/2011   Malignant HTN with heart disease, w/o CHF, 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 [place], [place], [address] Phone: [phone]   Fax:  [phone]  Name: Ruth [name] MRN: [id number] Date of Birth: [birth year]

## 2021-11-03 ENCOUNTER — Ambulatory Visit: Payer: Medicare Other | Admitting: Physical Therapy

## 2021-11-03 ENCOUNTER — Encounter: Payer: Self-pay | Admitting: Physical Therapy

## 2021-11-03 ENCOUNTER — Other Ambulatory Visit: Payer: Self-pay

## 2021-11-03 DIAGNOSIS — M542 Cervicalgia: Secondary | ICD-10-CM | POA: Diagnosis not present

## 2021-11-03 DIAGNOSIS — M5412 Radiculopathy, cervical region: Secondary | ICD-10-CM

## 2021-11-03 DIAGNOSIS — R262 Difficulty in walking, not elsewhere classified: Secondary | ICD-10-CM

## 2021-11-03 DIAGNOSIS — R252 Cramp and spasm: Secondary | ICD-10-CM

## 2021-11-03 NOTE — Therapy (Signed)
[place]. [place], [place], [address] Phone: [phone]   Fax:  [phone]  Physical Therapy Treatment  Patient Details  Name: Ruth [name] MRN: [id number] Date of Birth: 1945-09-18 Referring Provider (PT): Perini   Encounter Date: 11/03/2021   PT End of Session - 11/03/21 0919     Visit Number 23    Date for PT Re-Evaluation 11/05/21    Authorization Type UHC Medicare    PT Start Time 0836    PT Stop Time 0920    PT Time Calculation (min) 44 min    Activity Tolerance Patient tolerated treatment well;Patient limited by pain    Behavior During Therapy [facility] for tasks assessed/performed             Past Medical History:  Diagnosis Date   Anxiety    CAD (coronary artery disease)    a. CABG x 4 in 2006 in [place] (LIMA->LAD, VG->Diag, VG->OM, VG->RCA); b. DES to midbody of SVG-PDA/PLA 07/2012; c. 03/2015 Myoview: EF 65%, small defect of mild severit in basal inferolateral wall, low risk.   Complication of anesthesia    "quit breathing when I got ?Inovar" (07/28/2012)   Depression    Diverticulitis    Diverticulosis    Dyslipidemia    Intolerant of statins   Fecal incontinence    GERD (gastroesophageal reflux disease)    Hyperlipidemia    Hyperplastic colon polyp    IBS (irritable bowel syndrome)    Labile hypertension    a. 09/2016 Renal Artery duplex: nl renal arteries.   Migraines    "had them in my 58's" (07/28/2012)   Multiple allergies    Obesity    Steatohepatitis    Type II diabetes mellitus ([place])    a. no meds, diet controlled - takes cinnamon.    Past Surgical History:  Procedure Laterality Date   ABDOMINAL HYSTERECTOMY  1970's   BREAST LUMPECTOMY     bilateral   CARDIAC CATHETERIZATION  2006   CORONARY ANGIOPLASTY WITH STENT PLACEMENT  07/28/2012   "1; first time for me" (07/28/2012)   CORONARY ARTERY BYPASS GRAFT  2006   CABG X4   CORONARY STENT INTERVENTION N/A  03/01/2017   Procedure: Coronary Stent Intervention;  Surgeon: Troy Sine, MD;  Location: [place] [facility];  Service: Cardiovascular;  Laterality: N/A;   CORONARY STENT INTERVENTION N/A 06/23/2019   Procedure: CORONARY STENT INTERVENTION;  Surgeon: Martinique, Peter M, MD;  Location: [place] [facility];  Service: Cardiovascular;  Laterality: N/A;   EXCISIONAL HEMORRHOIDECTOMY  1970's   INGUINAL HERNIA REPAIR  1970's?   left   LEFT HEART CATH AND CORS/GRAFTS ANGIOGRAPHY N/A 03/01/2017   Procedure: Left Heart Cath and Cors/Grafts Angiography;  Surgeon: Troy Sine, MD;  Location: [place] [facility];  Service: Cardiovascular;  Laterality: N/A;   LEFT HEART CATH AND CORS/GRAFTS ANGIOGRAPHY N/A 06/22/2019   Procedure: LEFT HEART CATH AND CORS/GRAFTS ANGIOGRAPHY;  Surgeon: Belva Crome, MD;  Location: [place] [facility];  Service: Cardiovascular;  Laterality: N/A;   LEFT HEART CATHETERIZATION WITH CORONARY/GRAFT ANGIOGRAM N/A 07/28/2012   Procedure: LEFT HEART CATHETERIZATION WITH Beatrix Fetters;  Surgeon: Burnell Blanks, MD;  Location[facility] CATH LAB;  Service: Cardiovascular;  Laterality: N/A;   PERCUTANEOUS CORONARY STENT INTERVENTION (PCI-S)  07/28/2012   Procedure: PERCUTANEOUS CORONARY STENT INTERVENTION (PCI-S);  Surgeon: Burnell Blanks, MD;  Location: [facility] LAB;  Service: Cardiovascular;;   TONSILLECTOMY AND ADENOIDECTOMY  ~  1951    There were no vitals filed for this visit.   Subjective Assessment - 11/03/21 0842     Subjective I was feeling good, so I cleaned out the garage, really did a lot of work, I could not have done it before I started feeling better with you    Currently in Pain? Yes    Pain Score 7     Pain Location Neck    Pain Descriptors / Indicators Spasm;Tightness    Aggravating Factors  working in [place]                               [facility] Adult PT Treatment/Exercise - 11/03/21 0001       Neck Exercises: Theraband    Shoulder Extension 20 reps;Red    Rows 20 reps;Red    Shoulder External Rotation 20 reps;Red    Horizontal ABduction 20 reps;Red      Neck Exercises: Standing   Other Standing Exercises wand exercises      Neck Exercises: Seated   Money 15 reps    Other Seated Exercise ankle circles, marching, LAQ's and toe and heel raises all 20x      Manual Therapy   Soft tissue mobilization to the right cervical area, right upper trap and right upper arm, to the left cervical and upper trap area,    Passive ROM to the right shoulder, neck and ankles, toes                       PT Short Term Goals - 08/11/21 0905       PT SHORT TERM GOAL #1   Title pt will be I with inital HEP    Status Achieved               PT Long Term Goals - 10/29/21 0924       PT LONG TERM GOAL #1   Title pt will be I with all advanced HEP    Status On-going      PT LONG TERM GOAL #2   Title decrease pain overall 50%    Status Partially Met      PT LONG TERM GOAL #3   Title report 50% less pain with driving    Status Partially Met                   Plan - 11/03/21 0920     Clinical Impression Statement Patient reports that overall she has been doing better and feeling stronger, got out and cleaned on the garage this weekend.  She reports htat she was surprised that she was able to do so much, she does report some increased pain and tightness, the mms in the neck and the upper traps were very tight today.    PT Next Visit Plan may need to give her some LE exercises for HEP    Consulted and Agree with Plan of Care Patient             Patient will benefit from skilled therapeutic intervention in order to improve the following deficits and impairments:  Decreased range of motion, Increased muscle spasms, Pain, Improper body mechanics, Decreased strength, Decreased mobility, Postural dysfunction  Visit Diagnosis: Cervicalgia  Cramp and spasm  Radiculopathy, cervical  region  Difficulty in walking, not elsewhere classified     Problem List Patient Active Problem List   Diagnosis Date Noted  Dizziness 12/24/2020   Multiple drug allergies 12/24/2020   Dysuria 01/17/2020   Non-ST elevation (NSTEMI) myocardial infarction [facility])    Chest pain 06/21/2019   IBS (irritable bowel syndrome) 06/21/2019   Hyperbilirubinemia 06/21/2019   GERD (gastroesophageal reflux disease) 06/21/2019   Low back pain 05/11/2019   Atopic dermatitis 04/03/2019   Other specified health status 08/10/2018   Encounter for general adult medical examination without abnormal findings 06/15/2018   Noninflammatory disorder of vagina 06/13/2018   Arthralgia of right temporomandibular joint 10/28/2017   Bilateral impacted cerumen 10/28/2017   Chronic otitis externa of left ear 10/28/2017   Frequency of micturition 09/28/2017   Benign neoplasm of colon 08/24/2017   Spinal stenosis of lumbar region 08/24/2017   Fall 04/27/2017   Abnormal nuclear stress test    Other stressful life events affecting family and household 01/26/2017   Diverticula of intestine 09/28/2016   Eructation 09/28/2016   Tinea unguium 06/24/2016   Neck pain 03/12/2016   Hypertensive heart disease 05/25/2015   Hyperglycemia due to type 2 diabetes mellitus ([place]) 03/22/2015   Pure hypercholesterolemia 03/22/2015   Type II or unspecified type diabetes mellitus without mention of complication, uncontrolled 02/14/2014   Hyponatremia 02/13/2014   HTN (hypertension) 02/13/2014   Sepsis secondary to UTI ([place]) 02/12/2014   Acute pyelonephritis 02/12/2014   Pain in left leg 11/13/2013   Candidiasis 09/12/2013   Impingement syndrome of left shoulder region 02/14/2013   Spasm 12/14/2012   Unstable angina (HCC) 07/29/2012   Obesity (BMI 30-39.9) 07/29/2012   Chronic ulcer of skin ([place]) 06/16/2012   Hemorrhoids 06/16/2012   Bloating 06/15/2012   Generalized abdominal pain 06/15/2012   Family history of colon  cancer 06/15/2012   Breast lump 01/11/2012   Microscopic hematuria 01/11/2012   Myalgia 11/24/2011   Malignant HTN with heart disease, w/o CHF, 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 [place], [place], [address] Phone: [phone]   Fax:  [phone]  Name: Ruth [name] MRN: [id number] Date of Birth: [birth year]

## 2021-11-05 ENCOUNTER — Encounter: Payer: Self-pay | Admitting: Physical Therapy

## 2021-11-05 ENCOUNTER — Other Ambulatory Visit: Payer: Self-pay

## 2021-11-05 ENCOUNTER — Ambulatory Visit: Payer: Medicare Other | Attending: Neurological Surgery | Admitting: Physical Therapy

## 2021-11-05 DIAGNOSIS — R262 Difficulty in walking, not elsewhere classified: Secondary | ICD-10-CM

## 2021-11-05 DIAGNOSIS — R252 Cramp and spasm: Secondary | ICD-10-CM | POA: Diagnosis present

## 2021-11-05 DIAGNOSIS — M5412 Radiculopathy, cervical region: Secondary | ICD-10-CM | POA: Diagnosis present

## 2021-11-05 DIAGNOSIS — M542 Cervicalgia: Secondary | ICD-10-CM

## 2021-11-05 NOTE — Therapy (Signed)
[place] [place], [place], [address] Phone: [phone]   Fax:  [phone]  Physical Therapy Treatment  Patient Details  Name: Ruth [name] MRN: [id number] Date of Birth: 1946-08-22 Referring Provider (PT): Perini   Encounter Date: 11/05/2021   PT End of Session - 11/05/21 0926     Visit Number 24    Date for PT Re-Evaluation 12/06/21    Authorization Type UHC Medicare    PT Start Time 0836    PT Stop Time 0920    PT Time Calculation (min) 44 min    Activity Tolerance Patient tolerated treatment well;Patient limited by pain    Behavior During Therapy [facility] for tasks assessed/performed             Past Medical History:  Diagnosis Date   Anxiety    CAD (coronary artery disease)    a. CABG x 4 in 2006 in [place] (LIMA->LAD, VG->Diag, VG->OM, VG->RCA); b. DES to midbody of SVG-PDA/PLA 07/2012; c. 03/2015 Myoview: EF 65%, small defect of mild severit in basal inferolateral wall, low risk.   Complication of anesthesia    "quit breathing when I got ?Inovar" (07/28/2012)   Depression    Diverticulitis    Diverticulosis    Dyslipidemia    Intolerant of statins   Fecal incontinence    GERD (gastroesophageal reflux disease)    Hyperlipidemia    Hyperplastic colon polyp    IBS (irritable bowel syndrome)    Labile hypertension    a. 09/2016 Renal Artery duplex: nl renal arteries.   Migraines    "had them in my 45's" (07/28/2012)   Multiple allergies    Obesity    Steatohepatitis    Type II diabetes mellitus ([place])    a. no meds, diet controlled - takes cinnamon.    Past Surgical History:  Procedure Laterality Date   ABDOMINAL HYSTERECTOMY  1970's   BREAST LUMPECTOMY     bilateral   CARDIAC CATHETERIZATION  2006   CORONARY ANGIOPLASTY WITH STENT PLACEMENT  07/28/2012   "1; first time for me" (07/28/2012)   CORONARY ARTERY BYPASS GRAFT  2006   CABG X4   CORONARY STENT INTERVENTION N/A  03/01/2017   Procedure: Coronary Stent Intervention;  Surgeon: Troy Sine, MD;  Location: [facility];  Service: Cardiovascular;  Laterality: N/A;   CORONARY STENT INTERVENTION N/A 06/23/2019   Procedure: CORONARY STENT INTERVENTION;  Surgeon: Martinique, Peter M, MD;  Location: [place];  Service: Cardiovascular;  Laterality: N/A;   EXCISIONAL HEMORRHOIDECTOMY  1970's   INGUINAL HERNIA REPAIR  1970's?   left   LEFT HEART CATH AND CORS/GRAFTS ANGIOGRAPHY N/A 03/01/2017   Procedure: Left Heart Cath and Cors/Grafts Angiography;  Surgeon: Troy Sine, MD;  Location: [facility];  Service: Cardiovascular;  Laterality: N/A;   LEFT HEART CATH AND CORS/GRAFTS ANGIOGRAPHY N/A 06/22/2019   Procedure: LEFT HEART CATH AND CORS/GRAFTS ANGIOGRAPHY;  Surgeon: Belva Crome, MD;  Location: [facility];  Service: Cardiovascular;  Laterality: N/A;   LEFT HEART CATHETERIZATION WITH CORONARY/GRAFT ANGIOGRAM N/A 07/28/2012   Procedure: LEFT HEART CATHETERIZATION WITH Beatrix Fetters;  Surgeon: Burnell Blanks, MD;  Location: [facility] CATH LAB;  Service: Cardiovascular;  Laterality: N/A;   PERCUTANEOUS CORONARY STENT INTERVENTION (PCI-S)  07/28/2012   Procedure: PERCUTANEOUS CORONARY STENT INTERVENTION (PCI-S);  Surgeon: Burnell Blanks, MD;  Location: [facility] CATH LAB;  Service: Cardiovascular;;   TONSILLECTOMY AND ADENOIDECTOMY  ~  1951    There were no vitals filed for this visit.   Subjective Assessment - 11/05/21 0924     Subjective Patient reports that she has done more work in the garage, reports she is tired and sore but happy that she has been able to do so much    Currently in Pain? Yes    Pain Score 5     Pain Location Neck    Pain Relieving Factors the treatment really helps me                               [facility] Adult PT Treatment/Exercise - 11/05/21 0001       Neck Exercises: Theraband   Shoulder Extension 20 reps;Red     Rows 20 reps;Red    Shoulder External Rotation 20 reps;Red    Horizontal ABduction 20 reps;Red      Neck Exercises: Standing   Wall Wash flexion and CW/CCW circles    Other Standing Exercises wand exercises      Neck Exercises: Seated   Neck Retraction 10 reps    Money 15 reps    Shoulder Shrugs 20 reps    Shoulder Shrugs Limitations with 5# with upper trap and levator stretches after every 5 reps    Other Seated Exercise ankle circles, marching, LAQ's and toe and heel raises all 20x      Manual Therapy   Soft tissue mobilization to the right cervical area, right upper trap and right upper arm, to the left cervical and upper trap area,    Passive ROM to the right shoulder, neck and ankles, toes                       PT Short Term Goals - 08/11/21 0905       PT SHORT TERM GOAL #1   Title pt will be I with inital HEP    Status Achieved               PT Long Term Goals - 11/05/21 1038       PT LONG TERM GOAL #1   Title pt will be I with all advanced HEP    Status Partially Met                   Plan - 11/05/21 1037     Clinical Impression Statement Patient report shtat she is thankful htat she has felt food enough to clean the garage, however this is causing some pain in the neck and shoulders, overall the ROM and function is improving she is walking better, today she did have some difficulty geting up from chair without arm rests    PT Next Visit Plan may need to give her some LE exercises for HEP    Consulted and Agree with Plan of Care Patient             Patient will benefit from skilled therapeutic intervention in order to improve the following deficits and impairments:  Decreased range of motion, Increased muscle spasms, Pain, Improper body mechanics, Decreased strength, Decreased mobility, Postural dysfunction  Visit Diagnosis: Cervicalgia - Plan: PT plan of care cert/re-cert  Cramp and spasm - Plan: PT plan of care  cert/re-cert  Radiculopathy, cervical region - Plan: PT plan of care cert/re-cert  Difficulty in walking, not elsewhere classified - Plan: PT plan of care cert/re-cert     Problem List  Patient Active Problem List   Diagnosis Date Noted   Dizziness 12/24/2020   Multiple drug allergies 12/24/2020   Dysuria 01/17/2020   Non-ST elevation (NSTEMI) myocardial infarction [facility])    Chest pain 06/21/2019   IBS (irritable bowel syndrome) 06/21/2019   Hyperbilirubinemia 06/21/2019   GERD (gastroesophageal reflux disease) 06/21/2019   Low back pain 05/11/2019   Atopic dermatitis 04/03/2019   Other specified health status 08/10/2018   Encounter for general adult medical examination without abnormal findings 06/15/2018   Noninflammatory disorder of vagina 06/13/2018   Arthralgia of right temporomandibular joint 10/28/2017   Bilateral impacted cerumen 10/28/2017   Chronic otitis externa of left ear 10/28/2017   Frequency of micturition 09/28/2017   Benign neoplasm of colon 08/24/2017   Spinal stenosis of lumbar region 08/24/2017   Fall 04/27/2017   Abnormal nuclear stress test    Other stressful life events affecting family and household 01/26/2017   Diverticula of intestine 09/28/2016   Eructation 09/28/2016   Tinea unguium 06/24/2016   Neck pain 03/12/2016   Hypertensive heart disease 05/25/2015   Hyperglycemia due to type 2 diabetes mellitus ([place]) 03/22/2015   Pure hypercholesterolemia 03/22/2015   Type II or unspecified type diabetes mellitus without mention of complication, uncontrolled 02/14/2014   Hyponatremia 02/13/2014   HTN (hypertension) 02/13/2014   Sepsis secondary to UTI ([place]) 02/12/2014   Acute pyelonephritis 02/12/2014   Pain in left leg 11/13/2013   Candidiasis 09/12/2013   Impingement syndrome of left shoulder region 02/14/2013   Spasm 12/14/2012   Unstable angina (HCC) 07/29/2012   Obesity (BMI 30-39.9) 07/29/2012   Chronic ulcer of skin ([place]) 06/16/2012    Hemorrhoids 06/16/2012   Bloating 06/15/2012   Generalized abdominal pain 06/15/2012   Family history of colon cancer 06/15/2012   Breast lump 01/11/2012   Microscopic hematuria 01/11/2012   Myalgia 11/24/2011   Malignant HTN with heart disease, w/o CHF, 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 [place], [place], [address] Phone: [phone]   Fax:  [phone]  Name: KRYSTALLE PILKINGTON MRN: [id number] Date of Birth: [birth year]

## 2021-11-10 ENCOUNTER — Ambulatory Visit: Payer: Medicare Other | Admitting: Physical Therapy

## 2021-11-11 ENCOUNTER — Encounter: Payer: Self-pay | Admitting: Cardiovascular Disease

## 2021-11-12 ENCOUNTER — Encounter: Payer: Self-pay | Admitting: Physical Therapy

## 2021-11-12 ENCOUNTER — Other Ambulatory Visit: Payer: Self-pay

## 2021-11-12 ENCOUNTER — Ambulatory Visit: Payer: Medicare Other | Admitting: Physical Therapy

## 2021-11-12 DIAGNOSIS — M542 Cervicalgia: Secondary | ICD-10-CM

## 2021-11-12 DIAGNOSIS — M5412 Radiculopathy, cervical region: Secondary | ICD-10-CM

## 2021-11-12 DIAGNOSIS — R262 Difficulty in walking, not elsewhere classified: Secondary | ICD-10-CM

## 2021-11-12 DIAGNOSIS — R252 Cramp and spasm: Secondary | ICD-10-CM

## 2021-11-12 NOTE — Therapy (Signed)
[place] [place], [place], [address] Phone: [phone]   Fax:  [phone]  Physical Therapy Treatment  Patient Details  Name: Ruth [name] MRN: [id number] Date of Birth: [birth year] Referring Provider (PT): Perini   Encounter Date: 11/12/2021   PT End of Session - 11/12/21 0925     Visit Number 25    Date for PT Re-Evaluation 12/06/21    Authorization Type UHC Medicare    PT Start Time 0838    PT Stop Time 0925    PT Time Calculation (min) 47 min    Activity Tolerance Patient tolerated treatment well    Behavior During Therapy [facility] for tasks assessed/performed             Past Medical History:  Diagnosis Date   Anxiety    CAD (coronary artery disease)    a. CABG x 4 in 2006 in [place] (LIMA->LAD, VG->Diag, VG->OM, VG->RCA); b. DES to midbody of SVG-PDA/PLA 07/2012; c. 03/2015 Myoview: EF 65%, small defect of mild severit in basal inferolateral wall, low risk.   Complication of anesthesia    "quit breathing when I got ?Inovar" (07/28/2012)   Depression    Diverticulitis    Diverticulosis    Dyslipidemia    Intolerant of statins   Fecal incontinence    GERD (gastroesophageal reflux disease)    Hyperlipidemia    Hyperplastic colon polyp    IBS (irritable bowel syndrome)    Labile hypertension    a. 09/2016 Renal Artery duplex: nl renal arteries.   Migraines    "had them in my 52's" (07/28/2012)   Multiple allergies    Obesity    Steatohepatitis    Type II diabetes mellitus ([place])    a. no meds, diet controlled - takes cinnamon.    Past Surgical History:  Procedure Laterality Date   ABDOMINAL HYSTERECTOMY  1970's   BREAST LUMPECTOMY     bilateral   CARDIAC CATHETERIZATION  2006   CORONARY ANGIOPLASTY WITH STENT PLACEMENT  07/28/2012   "1; first time for me" (07/28/2012)   CORONARY ARTERY BYPASS GRAFT  2006   CABG X4   CORONARY STENT INTERVENTION N/A 03/01/2017   Procedure:  Coronary Stent Intervention;  Surgeon: Troy Sine, MD;  Location: [place] [facility];  Service: Cardiovascular;  Laterality: N/A;   CORONARY STENT INTERVENTION N/A 06/23/2019   Procedure: CORONARY STENT INTERVENTION;  Surgeon: Martinique, Peter M, MD;  Location: [facility];  Service: Cardiovascular;  Laterality: N/A;   EXCISIONAL HEMORRHOIDECTOMY  1970's   INGUINAL HERNIA REPAIR  1970's?   left   LEFT HEART CATH AND CORS/GRAFTS ANGIOGRAPHY N/A 03/01/2017   Procedure: Left Heart Cath and Cors/Grafts Angiography;  Surgeon: Troy Sine, MD;  Location: [facility];  Service: Cardiovascular;  Laterality: N/A;   LEFT HEART CATH AND CORS/GRAFTS ANGIOGRAPHY N/A 06/22/2019   Procedure: LEFT HEART CATH AND CORS/GRAFTS ANGIOGRAPHY;  Surgeon: Belva Crome, MD;  Location: [facility];  Service: Cardiovascular;  Laterality: N/A;   LEFT HEART CATHETERIZATION WITH CORONARY/GRAFT ANGIOGRAM N/A 07/28/2012   Procedure: LEFT HEART CATHETERIZATION WITH Beatrix Fetters;  Surgeon: Burnell Blanks, MD;  Location[facility] CATH LAB;  Service: Cardiovascular;  Laterality: N/A;   PERCUTANEOUS CORONARY STENT INTERVENTION (PCI-S)  07/28/2012   Procedure: PERCUTANEOUS CORONARY STENT INTERVENTION (PCI-S);  Surgeon: Burnell Blanks, MD;  Location: [facility] CATH LAB;  Service: Cardiovascular;;   TONSILLECTOMY AND ADENOIDECTOMY  ~ 1951  There were no vitals filed for this visit.   Subjective Assessment - 11/12/21 0923     Subjective Patient continues to report an increaesd ability to walk better and do things around the house, she does report able to mow a small area of the yard    Currently in Pain? Yes    Pain Score 4     Pain Location Neck    Pain Descriptors / Indicators Sore;Tightness    Pain Relieving Factors I am feeling better after I see you                               [facility] Adult PT Treatment/Exercise - 11/12/21 0001       Neck Exercises:  Standing   Wall Wash flexion and CW/CCW circles    Other Standing Exercises W backs/shrugs, 2# stick extension      Neck Exercises: Seated   Other Seated Exercise ankle circles, marching, LAQ's and toe and heel raises all 20x    Other Seated Exercise 2# stick biceps and then a small shoulder press to overhead      Manual Therapy   Soft tissue mobilization to the right cervical area, right upper trap and right upper arm, to the left cervical and upper trap area,    Passive ROM to the right shoulder, neck and ankles, toes                       PT Short Term Goals - 08/11/21 0905       PT SHORT TERM GOAL #1   Title pt will be I with inital HEP    Status Achieved               PT Long Term Goals - 11/12/21 0929       PT LONG TERM GOAL #1   Title pt will be I with all advanced HEP    Status Partially Met      PT LONG TERM GOAL #2   Title decrease pain overall 50%    Status Partially Met      PT LONG TERM GOAL #3   Title report 50% less pain with driving    Status Achieved                   Plan - 11/12/21 0926     Clinical Impression Statement Patient continues to report feeling well enough to do more around the house and has been able to tolerate the activities with less pain and tightness, she was tighter in the left upper trap today, still tight with knots in the cervical area bilaterally.  We are looking to decrease to 1x/week to see if she can continue to progress with less PT intervention    PT Next Visit Plan try to decrease to 1x/week    Consulted and Agree with Plan of Care Patient             Patient will benefit from skilled therapeutic intervention in order to improve the following deficits and impairments:  Decreased range of motion, Increased muscle spasms, Pain, Improper body mechanics, Decreased strength, Decreased mobility, Postural dysfunction  Visit Diagnosis: Cervicalgia  Cramp and spasm  Radiculopathy, cervical  region  Difficulty in walking, not elsewhere classified     Problem List Patient Active Problem List   Diagnosis Date Noted   Dizziness 12/24/2020   Multiple drug allergies 12/24/2020  Dysuria 01/17/2020   Non-ST elevation (NSTEMI) myocardial infarction [facility])    Chest pain 06/21/2019   IBS (irritable bowel syndrome) 06/21/2019   Hyperbilirubinemia 06/21/2019   GERD (gastroesophageal reflux disease) 06/21/2019   Low back pain 05/11/2019   Atopic dermatitis 04/03/2019   Other specified health status 08/10/2018   Encounter for general adult medical examination without abnormal findings 06/15/2018   Noninflammatory disorder of vagina 06/13/2018   Arthralgia of right temporomandibular joint 10/28/2017   Bilateral impacted cerumen 10/28/2017   Chronic otitis externa of left ear 10/28/2017   Frequency of micturition 09/28/2017   Benign neoplasm of colon 08/24/2017   Spinal stenosis of lumbar region 08/24/2017   Fall 04/27/2017   Abnormal nuclear stress test    Other stressful life events affecting family and household 01/26/2017   Diverticula of intestine 09/28/2016   Eructation 09/28/2016   Tinea unguium 06/24/2016   Neck pain 03/12/2016   Hypertensive heart disease 05/25/2015   Hyperglycemia due to type 2 diabetes mellitus ([place]) 03/22/2015   Pure hypercholesterolemia 03/22/2015   Type II or unspecified type diabetes mellitus without mention of complication, uncontrolled 02/14/2014   Hyponatremia 02/13/2014   HTN (hypertension) 02/13/2014   Sepsis secondary to UTI ([place]) 02/12/2014   Acute pyelonephritis 02/12/2014   Pain in left leg 11/13/2013   Candidiasis 09/12/2013   Impingement syndrome of left shoulder region 02/14/2013   Spasm 12/14/2012   Unstable angina ([facility]CC) 07/29/2012   Obesity (BMI 30-39.9) 07/29/2012   Chronic ulcer of skin ([place]) 06/16/2012   Hemorrhoids 06/16/2012   Bloating 06/15/2012   Generalized abdominal pain 06/15/2012   Family history of colon  cancer 06/15/2012   Breast lump 01/11/2012   Microscopic hematuria 01/11/2012   Myalgia 11/24/2011   Malignant HTN with heart disease, w/o CHF, 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 [place], [place], [address] Phone: [phone]   Fax:  [phone]  Name: LIDIYA REISE MRN: [id number] Date of Birth: 09-26-1945

## 2021-11-17 ENCOUNTER — Ambulatory Visit: Payer: Medicare Other | Admitting: Physical Therapy

## 2021-11-19 ENCOUNTER — Ambulatory Visit: Payer: Medicare Other | Admitting: Physical Therapy

## 2021-11-19 ENCOUNTER — Encounter: Payer: Self-pay | Admitting: Physical Therapy

## 2021-11-19 ENCOUNTER — Other Ambulatory Visit: Payer: Self-pay

## 2021-11-19 DIAGNOSIS — M542 Cervicalgia: Secondary | ICD-10-CM

## 2021-11-19 DIAGNOSIS — R262 Difficulty in walking, not elsewhere classified: Secondary | ICD-10-CM

## 2021-11-19 DIAGNOSIS — R252 Cramp and spasm: Secondary | ICD-10-CM

## 2021-11-19 DIAGNOSIS — M5412 Radiculopathy, cervical region: Secondary | ICD-10-CM

## 2021-11-19 NOTE — Therapy (Signed)
[facility] [address] ?[place], [place], [address] ?Phone: [phone]   Fax:  [phone] ? ?Physical Therapy Treatment ? ?Patient Details  ?Name: Ruth [name] ?MRN: [id number] ?Date of Birth: 06/28/1946 ?Referring Provider (PT): Perini ? ? ?Encounter Date: 11/19/2021 ? ? PT End of Session - 11/19/21 0933   ? ? Visit Number 26   ? Date for PT Re-Evaluation 12/06/21   ? Authorization Type UHC Medicare   ? PT Start Time [phone]   ? PT Stop Time 0925   ? PT Time Calculation (min) 47 min   ? Activity Tolerance Patient tolerated treatment well   ? Behavior During Therapy [facility] for tasks assessed/performed   ? ?  ?  ? ?  ? ? ?Past Medical History:  ?Diagnosis Date  ? Anxiety   ? CAD (coronary artery disease)   ? a. CABG x 4 in 2006 in [place] (LIMA->LAD, VG->Diag, VG->OM, VG->RCA); b. DES to midbody of SVG-PDA/PLA 07/2012; c. 03/2015 Myoview: EF 65%, small defect of mild severit in basal inferolateral wall, low risk.  ? Complication of anesthesia   ? "quit breathing when I got ?Inovar" (07/28/2012)  ? Depression   ? Diverticulitis   ? Diverticulosis   ? Dyslipidemia   ? Intolerant of statins  ? Fecal incontinence   ? GERD (gastroesophageal reflux disease)   ? Hyperlipidemia   ? Hyperplastic colon polyp   ? IBS (irritable bowel syndrome)   ? Labile hypertension   ? a. 09/2016 Renal Artery duplex: nl renal arteries.  ? Migraines   ? "had them in my 30's" (07/28/2012)  ? Multiple allergies   ? Obesity   ? Steatohepatitis   ? Type II diabetes mellitus ([place])   ? a. no meds, diet controlled - takes cinnamon.  ? ? ?Past Surgical History:  ?Procedure Laterality Date  ? ABDOMINAL HYSTERECTOMY  1970's  ? BREAST LUMPECTOMY    ? bilateral  ? CARDIAC CATHETERIZATION  2006  ? CORONARY ANGIOPLASTY WITH STENT PLACEMENT  07/28/2012  ? "1; first time for me" (07/28/2012)  ? CORONARY ARTERY BYPASS GRAFT  2006  ? CABG X4  ? CORONARY STENT INTERVENTION N/A 03/01/2017  ? Procedure:  Coronary Stent Intervention;  Surgeon: Troy Sine, MD;  Location: [facility];  Service: Cardiovascular;  Laterality: N/A;  ? CORONARY STENT INTERVENTION N/A 06/23/2019  ? Procedure: CORONARY STENT INTERVENTION;  Surgeon: Martinique, Peter M, MD;  Location: [facility];  Service: Cardiovascular;  Laterality: N/A;  ? EXCISIONAL HEMORRHOIDECTOMY  1970's  ? INGUINAL HERNIA REPAIR  1970's?  ? left  ? LEFT HEART CATH AND CORS/GRAFTS ANGIOGRAPHY N/A 03/01/2017  ? Procedure: Left Heart Cath and Cors/Grafts Angiography;  Surgeon: Troy Sine, MD;  Location: [facility];  Service: Cardiovascular;  Laterality: N/A;  ? LEFT HEART CATH AND CORS/GRAFTS ANGIOGRAPHY N/A 06/22/2019  ? Procedure: LEFT HEART CATH AND CORS/GRAFTS ANGIOGRAPHY;  Surgeon: Belva Crome, MD;  Location: [facility];  Service: Cardiovascular;  Laterality: N/A;  ? LEFT HEART CATHETERIZATION WITH CORONARY/GRAFT ANGIOGRAM N/A 07/28/2012  ? Procedure: LEFT HEART CATHETERIZATION WITH Beatrix Fetters;  Surgeon: Burnell Blanks, MD;  Location: [facility] CATH LAB;  Service: Cardiovascular;  Laterality: N/A;  ? PERCUTANEOUS CORONARY STENT INTERVENTION (PCI-S)  07/28/2012  ? Procedure: PERCUTANEOUS CORONARY STENT INTERVENTION (PCI-S);  Surgeon: Burnell Blanks, MD;  Location: [facility] CATH LAB;  Service: Cardiovascular;;  ? TONSILLECTOMY AND ADENOIDECTOMY  ~ 1951  ? ? ?  There were no vitals filed for this visit. ? ? Subjective Assessment - 11/19/21 0928   ? ? Subjective Patient reports that she cleaned out her closet and had to climb up on a step stool, reports able to do but neck and shoulders are stiff and sore, she had a little more difficulty getting up from sitting today   ? Currently in Pain? Yes   ? Pain Score 6    ? Pain Location Neck   ? Pain Descriptors / Indicators Sore;Spasm;Tightness   ? ?  ?  ? ?  ? ? ? ? ? ? ? ? ? ? ? ? ? ? ? ? ? ? ? ? [facility] Adult PT Treatment/Exercise - 11/19/21 0001   ? ?  ? Neck Exercises:  Theraband  ? Shoulder Extension 20 reps;Red   ? Rows 20 reps;Red   ? Shoulder External Rotation 20 reps;Red   ? Horizontal ABduction 20 reps;Red   ?  ? Neck Exercises: Standing  ? Other Standing Exercises W backs/shrugs, 2# stick extension   ? Other Standing Exercises wand exercises   ?  ? Neck Exercises: Seated  ? Money 15 reps   ? Shoulder Shrugs 20 reps   ? Shoulder Shrugs Limitations with 5# with upper trap and levator stretches after every 5 reps   ? Other Seated Exercise ankle circles, marching, LAQ's and toe and heel raises all 20x   ? Other Seated Exercise 2# stick biceps and then a small shoulder press to overhead   ?  ? Manual Therapy  ? Soft tissue mobilization to the right cervical area, right upper trap and right upper arm, to the left cervical and upper trap area,   ? Passive ROM to the right shoulder, neck and ankles, toes   ? ?  ?  ? ?  ? ? ? ? ? ? ? ? ? ? ? ? PT Short Term Goals - 08/11/21 0905   ? ?  ? PT SHORT TERM GOAL #1  ? Title pt will be I with inital HEP   ? Status Achieved   ? ?  ?  ? ?  ? ? ? ? PT Long Term Goals - 11/19/21 0935   ? ?  ? PT LONG TERM GOAL #2  ? Title decrease pain overall 50%   ? Status Partially Met   ?  ? PT LONG TERM GOAL #3  ? Title report 50% less pain with driving   ? Status Achieved   ? ?  ?  ? ?  ? ? ? ? ? ? ? ? Plan - 11/19/21 0933   ? ? Clinical Impression Statement Patient continues to be able to attempt and complete more activities at the house, however she doe shave increaed pain and tightness, demonstrated by much more difficulty getting up from sitting and the first few steps being difficult, the upper traps and the neckl are very tight and the ankles and feet very stiff   ? PT Next Visit Plan try to decrease to 1x/week   ? Consulted and Agree with Plan of Care Patient   ? ?  ?  ? ?  ? ? ?Patient will benefit from skilled therapeutic intervention in order to improve the following deficits and impairments:  Decreased range of motion, Increased muscle spasms,  Pain, Improper body mechanics, Decreased strength, Decreased mobility, Postural dysfunction ? ?Visit Diagnosis: ?Cervicalgia ? ?Cramp and spasm ? ?Radiculopathy, cervical region ? ?Difficulty in walking, not elsewhere  classified ? ? ? ? ?Problem List ?Patient Active Problem List  ? Diagnosis Date Noted  ? Dizziness 12/24/2020  ? Multiple drug allergies 12/24/2020  ? Dysuria 01/17/2020  ? Non-ST elevation (NSTEMI) myocardial infarction [facility])   ? Chest pain 06/21/2019  ? IBS (irritable bowel syndrome) 06/21/2019  ? Hyperbilirubinemia 06/21/2019  ? GERD (gastroesophageal reflux disease) 06/21/2019  ? Low back pain 05/11/2019  ? Atopic dermatitis 04/03/2019  ? Other specified health status 08/10/2018  ? Encounter for general adult medical examination without abnormal findings 06/15/2018  ? Noninflammatory disorder of vagina 06/13/2018  ? Arthralgia of right temporomandibular joint 10/28/2017  ? Bilateral impacted cerumen 10/28/2017  ? Chronic otitis externa of left ear 10/28/2017  ? Frequency of micturition 09/28/2017  ? Benign neoplasm of colon 08/24/2017  ? Spinal stenosis of lumbar region 08/24/2017  ? Fall 04/27/2017  ? Abnormal nuclear stress test   ? Other stressful life events affecting family and household 01/26/2017  ? Diverticula of intestine 09/28/2016  ? Eructation 09/28/2016  ? Tinea unguium 06/24/2016  ? Neck pain 03/12/2016  ? Hypertensive heart disease 05/25/2015  ? Hyperglycemia due to type 2 diabetes mellitus ([place]) 03/22/2015  ? Pure hypercholesterolemia 03/22/2015  ? Type II or unspecified type diabetes mellitus without mention of complication, uncontrolled 02/14/2014  ? Hyponatremia 02/13/2014  ? HTN (hypertension) 02/13/2014  ? Sepsis secondary to UTI ([place]) 02/12/2014  ? Acute pyelonephritis 02/12/2014  ? Pain in left leg 11/13/2013  ? Candidiasis 09/12/2013  ? Impingement syndrome of left shoulder region 02/14/2013  ? Spasm 12/14/2012  ? Unstable angina ([place]) 07/29/2012  ? Obesity (BMI 30-39.9)  07/29/2012  ? Chronic ulcer of skin ([place]) 06/16/2012  ? Hemorrhoids 06/16/2012  ? Bloating 06/15/2012  ? Generalized abdominal pain 06/15/2012  ? Family history of colon cancer 06/15/2012  ? Breast lump 05/06/2

## 2021-11-24 ENCOUNTER — Other Ambulatory Visit: Payer: Self-pay

## 2021-11-24 ENCOUNTER — Encounter: Payer: Self-pay | Admitting: Physical Therapy

## 2021-11-24 ENCOUNTER — Ambulatory Visit: Payer: Medicare Other | Admitting: Physical Therapy

## 2021-11-24 DIAGNOSIS — M542 Cervicalgia: Secondary | ICD-10-CM

## 2021-11-24 DIAGNOSIS — R262 Difficulty in walking, not elsewhere classified: Secondary | ICD-10-CM

## 2021-11-24 DIAGNOSIS — M5412 Radiculopathy, cervical region: Secondary | ICD-10-CM

## 2021-11-24 DIAGNOSIS — R252 Cramp and spasm: Secondary | ICD-10-CM

## 2021-11-24 NOTE — Therapy (Signed)
[facility] ?[address] ?[place], [place], [address] ?Phone: [phone]   Fax:  [phone] ? ?Physical Therapy Treatment ? ?Patient Details  ?Name: Ruth [name] ?MRN: [id number] ?Date of Birth: 10-Sep-1945 ?Referring Provider (PT): Perini ? ? ?Encounter Date: 11/24/2021 ? ? PT End of Session - 11/24/21 0915   ? ? Visit Number 27   ? Date for PT Re-Evaluation 12/06/21   ? Authorization Type UHC Medicare   ? PT Start Time [phone]   ? PT Stop Time 0916   ? PT Time Calculation (min) 40 min   ? Activity Tolerance Patient tolerated treatment well   ? Behavior During Therapy [facility] for tasks assessed/performed   ? ?  ?  ? ?  ? ? ?Past Medical History:  ?Diagnosis Date  ? Anxiety   ? CAD (coronary artery disease)   ? a. CABG x 4 in 2006 in [place] (LIMA->LAD, VG->Diag, VG->OM, VG->RCA); b. DES to midbody of SVG-PDA/PLA 07/2012; c. 03/2015 Myoview: EF 65%, small defect of mild severit in basal inferolateral wall, low risk.  ? Complication of anesthesia   ? "quit breathing when I got ?Inovar" (07/28/2012)  ? Depression   ? Diverticulitis   ? Diverticulosis   ? Dyslipidemia   ? Intolerant of statins  ? Fecal incontinence   ? GERD (gastroesophageal reflux disease)   ? Hyperlipidemia   ? Hyperplastic colon polyp   ? IBS (irritable bowel syndrome)   ? Labile hypertension   ? a. 09/2016 Renal Artery duplex: nl renal arteries.  ? Migraines   ? "had them in my 30's" (07/28/2012)  ? Multiple allergies   ? Obesity   ? Steatohepatitis   ? Type II diabetes mellitus ([place])   ? a. no meds, diet controlled - takes cinnamon.  ? ? ?Past Surgical History:  ?Procedure Laterality Date  ? ABDOMINAL HYSTERECTOMY  1970's  ? BREAST LUMPECTOMY    ? bilateral  ? CARDIAC CATHETERIZATION  2006  ? CORONARY ANGIOPLASTY WITH STENT PLACEMENT  07/28/2012  ? "1; first time for me" (07/28/2012)  ? CORONARY ARTERY BYPASS GRAFT  2006  ? CABG X4  ? CORONARY STENT INTERVENTION N/A 03/01/2017  ? Procedure:  Coronary Stent Intervention;  Surgeon: Troy Sine, MD;  Location: [facility];  Service: Cardiovascular;  Laterality: N/A;  ? CORONARY STENT INTERVENTION N/A 06/23/2019  ? Procedure: CORONARY STENT INTERVENTION;  Surgeon: Martinique, Peter M, MD;  Location: [facility];  Service: Cardiovascular;  Laterality: N/A;  ? EXCISIONAL HEMORRHOIDECTOMY  1970's  ? INGUINAL HERNIA REPAIR  1970's?  ? left  ? LEFT HEART CATH AND CORS/GRAFTS ANGIOGRAPHY N/A 03/01/2017  ? Procedure: Left Heart Cath and Cors/Grafts Angiography;  Surgeon: Troy Sine, MD;  Location: [facility];  Service: Cardiovascular;  Laterality: N/A;  ? LEFT HEART CATH AND CORS/GRAFTS ANGIOGRAPHY N/A 06/22/2019  ? Procedure: LEFT HEART CATH AND CORS/GRAFTS ANGIOGRAPHY;  Surgeon: Belva Crome, MD;  Location: [facility];  Service: Cardiovascular;  Laterality: N/A;  ? LEFT HEART CATHETERIZATION WITH CORONARY/GRAFT ANGIOGRAM N/A 07/28/2012  ? Procedure: LEFT HEART CATHETERIZATION WITH Beatrix Fetters;  Surgeon: Burnell Blanks, MD;  Location: [facility] CATH LAB;  Service: Cardiovascular;  Laterality: N/A;  ? PERCUTANEOUS CORONARY STENT INTERVENTION (PCI-S)  07/28/2012  ? Procedure: PERCUTANEOUS CORONARY STENT INTERVENTION (PCI-S);  Surgeon: Burnell Blanks, MD;  Location: [facility] CATH LAB;  Service: Cardiovascular;;  ? TONSILLECTOMY AND ADENOIDECTOMY  ~ 1951  ? ? ?  There were no vitals filed for this visit. ? ? Subjective Assessment - 11/24/21 0836   ? ? Subjective Didn't do much it was cold this weekend   ? Currently in Pain? Yes   ? Pain Score 4    ? Pain Location Neck   ? Pain Orientation Right;Left   ? Pain Descriptors / Indicators Spasm   ? Aggravating Factors  cold weather   ? Pain Relieving Factors what you do helps   ? ?  ?  ? ?  ? ? ? ? ? ? ? ? ? ? ? ? ? ? ? ? ? ? ? ? [facility] Adult PT Treatment/Exercise - 11/24/21 0001   ? ?  ? Manual Therapy  ? Soft tissue mobilization to the right cervical area, right upper trap  and right upper arm, to the left cervical and upper trap area,   ? Passive ROM to the right shoulder, neck and ankles, toes   ? ?  ?  ? ?  ? ? ? ? ? ? ? ? ? ? ? ? PT Short Term Goals - 08/11/21 0905   ? ?  ? PT SHORT TERM GOAL #1  ? Title pt will be I with inital HEP   ? Status Achieved   ? ?  ?  ? ?  ? ? ? ? PT Long Term Goals - 11/19/21 0935   ? ?  ? PT LONG TERM GOAL #2  ? Title decrease pain overall 50%   ? Status Partially Met   ?  ? PT LONG TERM GOAL #3  ? Title report 50% less pain with driving   ? Status Achieved   ? ?  ?  ? ?  ? ? ? ? ? ? ? ? Plan - 11/24/21 0915   ? ? Clinical Impression Statement Patient reports that the colder weaqther set her back a little, reports stiff and tight in the shoulders and neck, she is very tight and with spasms.  Seemed to hvae a little less ROM, the upper traps loosen up after a little bit but the neck mms remain very tight   ? PT Next Visit Plan work on the neck mms   ? Consulted and Agree with Plan of Care Patient   ? ?  ?  ? ?  ? ? ?Patient will benefit from skilled therapeutic intervention in order to improve the following deficits and impairments:  Decreased range of motion, Increased muscle spasms, Pain, Improper body mechanics, Decreased strength, Decreased mobility, Postural dysfunction ? ?Visit Diagnosis: ?Cervicalgia ? ?Cramp and spasm ? ?Radiculopathy, cervical region ? ?Difficulty in walking, not elsewhere classified ? ? ? ? ?Problem List ?Patient Active Problem List  ? Diagnosis Date Noted  ? Dizziness 12/24/2020  ? Multiple drug allergies 12/24/2020  ? Dysuria 01/17/2020  ? Non-ST elevation (NSTEMI) myocardial infarction [facility])   ? Chest pain 06/21/2019  ? IBS (irritable bowel syndrome) 06/21/2019  ? Hyperbilirubinemia 06/21/2019  ? GERD (gastroesophageal reflux disease) 06/21/2019  ? Low back pain 05/11/2019  ? Atopic dermatitis 04/03/2019  ? Other specified health status 08/10/2018  ? Encounter for general adult medical examination without abnormal findings  06/15/2018  ? Noninflammatory disorder of vagina 06/13/2018  ? Arthralgia of right temporomandibular joint 10/28/2017  ? Bilateral impacted cerumen 10/28/2017  ? Chronic otitis externa of left ear 10/28/2017  ? Frequency of micturition 09/28/2017  ? Benign neoplasm of colon 08/24/2017  ? Spinal stenosis of lumbar region 08/24/2017  ?  Fall 04/27/2017  ? Abnormal nuclear stress test   ? Other stressful life events affecting family and household 01/26/2017  ? Diverticula of intestine 09/28/2016  ? Eructation 09/28/2016  ? Tinea unguium 06/24/2016  ? Neck pain 03/12/2016  ? Hypertensive heart disease 05/25/2015  ? Hyperglycemia due to type 2 diabetes mellitus ([place]) 03/22/2015  ? Pure hypercholesterolemia 03/22/2015  ? Type II or unspecified type diabetes mellitus without mention of complication, uncontrolled 02/14/2014  ? Hyponatremia 02/13/2014  ? HTN (hypertension) 02/13/2014  ? Sepsis secondary to UTI ([place]) 02/12/2014  ? Acute pyelonephritis 02/12/2014  ? Pain in left leg 11/13/2013  ? Candidiasis 09/12/2013  ? Impingement syndrome of left shoulder region 02/14/2013  ? Spasm 12/14/2012  ? Unstable angina ([place]) 07/29/2012  ? Obesity (BMI 30-39.9) 07/29/2012  ? Chronic ulcer of skin ([place]) 06/16/2012  ? Hemorrhoids 06/16/2012  ? Bloating 06/15/2012  ? Generalized abdominal pain 06/15/2012  ? Family history of colon cancer 06/15/2012  ? Breast lump 01/11/2012  ? Microscopic hematuria 01/11/2012  ? Myalgia 11/24/2011  ? 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 Coronary artery disease 12/30/2010  ? Hyperlipidemia with target LDL less than 70 12/30/2010  ? Diabetes mellitus ([place]) 12/30/2010  ? Enthesopathy of hip region 11/29/2009  ? RECTAL INCONTINENCE 06/13/2008  ? ? Sumner Boast, PT ?11/24/2021, 9:17 AM ? ?[facility] ?[address] ?[address] ?[place], [place], [address] ?Phone: [phone]   Fax:  [phone] ? ?Name: Ruth [name] ?MRN: [id number] ?Date of Birth: [birth year] ? ? ? ?

## 2021-11-26 ENCOUNTER — Ambulatory Visit: Payer: Medicare Other | Admitting: Physical Therapy

## 2021-11-26 ENCOUNTER — Encounter: Payer: Self-pay | Admitting: Physical Therapy

## 2021-11-26 ENCOUNTER — Other Ambulatory Visit: Payer: Self-pay

## 2021-11-26 DIAGNOSIS — M542 Cervicalgia: Secondary | ICD-10-CM | POA: Diagnosis not present

## 2021-11-26 DIAGNOSIS — M5412 Radiculopathy, cervical region: Secondary | ICD-10-CM

## 2021-11-26 DIAGNOSIS — R252 Cramp and spasm: Secondary | ICD-10-CM

## 2021-11-26 DIAGNOSIS — R262 Difficulty in walking, not elsewhere classified: Secondary | ICD-10-CM

## 2021-11-26 NOTE — Therapy (Signed)
[facility] [address] ?[place], [place], [address] ?Phone: [phone]   Fax:  [phone] ? ?Physical Therapy Treatment ? ?Patient Details  ?Name: Ruth [name] ?MRN: [id number] ?Date of Birth: 10-Mar-1946 ?Referring Provider (PT): Perini ? ? ?Encounter Date: 11/26/2021 ? ? PT End of Session - 11/26/21 0949   ? ? Visit Number 28   ? Date for PT Re-Evaluation 12/06/21   ? Authorization Type UHC Medicare   ? PT Start Time [phone]   ? PT Stop Time [phone]   ? PT Time Calculation (min) 43 min   ? Activity Tolerance Patient tolerated treatment well   ? Behavior During Therapy [facility] for tasks assessed/performed   ? ?  ?  ? ?  ? ? ?Past Medical History:  ?Diagnosis Date  ? Anxiety   ? CAD (coronary artery disease)   ? a. CABG x 4 in 2006 in [place] (LIMA->LAD, VG->Diag, VG->OM, VG->RCA); b. DES to midbody of SVG-PDA/PLA 07/2012; c. 03/2015 Myoview: EF 65%, small defect of mild severit in basal inferolateral wall, low risk.  ? Complication of anesthesia   ? "quit breathing when I got ?Inovar" (07/28/2012)  ? Depression   ? Diverticulitis   ? Diverticulosis   ? Dyslipidemia   ? Intolerant of statins  ? Fecal incontinence   ? GERD (gastroesophageal reflux disease)   ? Hyperlipidemia   ? Hyperplastic colon polyp   ? IBS (irritable bowel syndrome)   ? Labile hypertension   ? a. 09/2016 Renal Artery duplex: nl renal arteries.  ? Migraines   ? "had them in my 30's" (07/28/2012)  ? Multiple allergies   ? Obesity   ? Steatohepatitis   ? Type II diabetes mellitus ([place])   ? a. no meds, diet controlled - takes cinnamon.  ? ? ?Past Surgical History:  ?Procedure Laterality Date  ? ABDOMINAL HYSTERECTOMY  1970's  ? BREAST LUMPECTOMY    ? bilateral  ? CARDIAC CATHETERIZATION  2006  ? CORONARY ANGIOPLASTY WITH STENT PLACEMENT  07/28/2012  ? "1; first time for me" (07/28/2012)  ? CORONARY ARTERY BYPASS GRAFT  2006  ? CABG X4  ? CORONARY STENT INTERVENTION N/A 03/01/2017  ? Procedure:  Coronary Stent Intervention;  Surgeon: Troy Sine, MD;  Location: [facility];  Service: Cardiovascular;  Laterality: N/A;  ? CORONARY STENT INTERVENTION N/A 06/23/2019  ? Procedure: CORONARY STENT INTERVENTION;  Surgeon: Martinique, Peter M, MD;  Location: [facility];  Service: Cardiovascular;  Laterality: N/A;  ? EXCISIONAL HEMORRHOIDECTOMY  1970's  ? INGUINAL HERNIA REPAIR  1970's?  ? left  ? LEFT HEART CATH AND CORS/GRAFTS ANGIOGRAPHY N/A 03/01/2017  ? Procedure: Left Heart Cath and Cors/Grafts Angiography;  Surgeon: Troy Sine, MD;  Location: [facility];  Service: Cardiovascular;  Laterality: N/A;  ? LEFT HEART CATH AND CORS/GRAFTS ANGIOGRAPHY N/A 06/22/2019  ? Procedure: LEFT HEART CATH AND CORS/GRAFTS ANGIOGRAPHY;  Surgeon: Belva Crome, MD;  Location: [facility];  Service: Cardiovascular;  Laterality: N/A;  ? LEFT HEART CATHETERIZATION WITH CORONARY/GRAFT ANGIOGRAM N/A 07/28/2012  ? Procedure: LEFT HEART CATHETERIZATION WITH Beatrix Fetters;  Surgeon: Burnell Blanks, MD;  Location: [facility] C[facility];  Service: Cardiovascular;  Laterality: N/A;  ? PERCUTANEOUS CORONARY STENT INTERVENTION (PCI-S)  07/28/2012  ? Procedure: PERCUTANEOUS CORONARY STENT INTERVENTION (PCI-S);  Surgeon: Burnell Blanks, MD;  Location: [facility] CATH LAB;  Service: Cardiovascular;;  ? TONSILLECTOMY AND ADENOIDECTOMY  ~ 1951  ? ? ?  There were no vitals filed for this visit. ? ? Subjective Assessment - 11/26/21 0938   ? ? Subjective I feel a lot better with you working on me   ? Currently in Pain? Yes   ? Pain Score 3    ? Pain Location Neck   ? Pain Descriptors / Indicators Tightness;Spasm   ? ?  ?  ? ?  ? ? ? ? ? ? ? ? ? ? ? ? ? ? ? ? ? ? ? ? [facility] PT Treatment/Exercise - 11/26/21 0001   ? ?  ? Neck Exercises: Theraband  ? Shoulder Extension 20 reps;Red   ? Rows 20 reps;Red   ? Shoulder External Rotation 20 reps;Red   ? Horizontal ABduction 20 reps;Red   ?  ? Neck Exercises:  Standing  ? Wall Wash flexion and CW/CCW circles   ? Other Standing Exercises W backs/shrugs, 2# stick extension   ? Other Standing Exercises wand exercises   ?  ? Neck Exercises: Seated  ? Shoulder Shrugs 20 reps   ? Shoulder Shrugs Limitations with 5# with upper trap and levator stretches after every 5 reps   ? Other Seated Exercise ankle circles, marching, LAQ's and toe and heel raises all 20x   ?  ? Manual Therapy  ? Soft tissue mobilization to the right cervical area, right upper trap and right upper arm, to the left cervical and upper trap area,   ? Passive ROM to the right shoulder, neck and ankles, toes   ? ?  ?  ? ?  ? ? ? ? ? ? ? ? ? ? ? ? PT Short Term Goals - 08/11/21 0905   ? ?  ? PT SHORT TERM GOAL #1  ? Title pt will be I with inital HEP   ? Status Achieved   ? ?  ?  ? ?  ? ? ? ? PT Long Term Goals - 11/26/21 0951   ? ?  ? PT LONG TERM GOAL #1  ? Title pt will be I with all advanced HEP   ? Status Partially Met   ?  ? PT LONG TERM GOAL #2  ? Title decrease pain overall 50%   ? Status Partially Met   ?  ? PT LONG TERM GOAL #3  ? Title report 50% less pain with driving   ? Status Achieved   ?  ? PT LONG TERM GOAL #4  ? Title increase cervical ROM 25%   ? Status Achieved   ?  ? PT LONG TERM GOAL #5  ? Title understand posture and body mechanics   ? Status Partially Met   ? ?  ?  ? ?  ? ? ? ? ? ? ? ? Plan - 11/26/21 0950   ? ? Clinical Impression Statement Patient reports that she is feeling better and moving better, she reports that she is feeling stronger and has better motions and walking better, she remains very tight in the mms of the neck.  We are working on this and will try to get her moving a little more and look to d/c with home program at the end of April   ? PT Next Visit Plan work on the neck mms   ? Consulted and Agree with Plan of Care Patient   ? ?  ?  ? ?  ? ? ?Patient will benefit from skilled therapeutic intervention in order to improve the following deficits and impairments:  Decreased  range of motion, Increased muscle spasms, Pain, Improper body mechanics, Decreased strength, Decreased mobility, Postural dysfunction ? ?Visit Diagnosis: ?Cervicalgia ? ?Radiculopathy, cervical region ? ?Cramp and spasm ? ?Difficulty in walking, not elsewhere classified ? ? ? ? ?Problem List ?Patient Active Problem List  ? Diagnosis Date Noted  ? Dizziness 12/24/2020  ? Multiple drug allergies 12/24/2020  ? Dysuria 01/17/2020  ? Non-ST elevation (NSTEMI) myocardial infarction [facility])   ? Chest pain 06/21/2019  ? IBS (irritable bowel syndrome) 06/21/2019  ? Hyperbilirubinemia 06/21/2019  ? GERD (gastroesophageal reflux disease) 06/21/2019  ? Low back pain 05/11/2019  ? Atopic dermatitis 04/03/2019  ? Other specified health status 08/10/2018  ? Encounter for general adult medical examination without abnormal findings 06/15/2018  ? Noninflammatory disorder of vagina 06/13/2018  ? Arthralgia of right temporomandibular joint 10/28/2017  ? Bilateral impacted cerumen 10/28/2017  ? Chronic otitis externa of left ear 10/28/2017  ? Frequency of micturition 09/28/2017  ? Benign neoplasm of colon 08/24/2017  ? Spinal stenosis of lumbar region 08/24/2017  ? Fall 04/27/2017  ? Abnormal nuclear stress test   ? Other stressful life events affecting family and household 01/26/2017  ? Diverticula of intestine 09/28/2016  ? Eructation 09/28/2016  ? Tinea unguium 06/24/2016  ? Neck pain 03/12/2016  ? Hypertensive heart disease 05/25/2015  ? Hyperglycemia due to type 2 diabetes mellitus ([place]) 03/22/2015  ? Pure hypercholesterolemia 03/22/2015  ? Type II or unspecified type diabetes mellitus without mention of complication, uncontrolled 02/14/2014  ? Hyponatremia 02/13/2014  ? HTN (hypertension) 02/13/2014  ? Sepsis secondary to UTI ([place]) 02/12/2014  ? Acute pyelonephritis 02/12/2014  ? Pain in left leg 11/13/2013  ? Candidiasis 09/12/2013  ? Impingement syndrome of left shoulder region 02/14/2013  ? Spasm 12/14/2012  ? Unstable angina  ([place]) 07/29/2012  ? Obesity (BMI 30-39.9) 07/29/2012  ? Chronic ulcer of skin ([place]) 06/16/2012  ? Hemorrhoids 06/16/2012  ? Bloating 06/15/2012  ? Generalized abdominal pain 06/15/2012  ? Family history of colon c

## 2021-12-01 ENCOUNTER — Encounter: Payer: Self-pay | Admitting: Physical Therapy

## 2021-12-01 ENCOUNTER — Ambulatory Visit: Payer: Medicare Other | Admitting: Physical Therapy

## 2021-12-01 ENCOUNTER — Other Ambulatory Visit: Payer: Self-pay

## 2021-12-01 DIAGNOSIS — M542 Cervicalgia: Secondary | ICD-10-CM

## 2021-12-01 DIAGNOSIS — R252 Cramp and spasm: Secondary | ICD-10-CM

## 2021-12-01 DIAGNOSIS — R262 Difficulty in walking, not elsewhere classified: Secondary | ICD-10-CM

## 2021-12-01 DIAGNOSIS — M5412 Radiculopathy, cervical region: Secondary | ICD-10-CM

## 2021-12-01 NOTE — Therapy (Signed)
?[facility] [address] ?[place], [place], [address] ?Phone: [phone]   Fax:  [phone] ? ?Physical Therapy Treatment ? ?Patient Details  ?Name: Ruth [name] ?MRN: [id number] ?Date of Birth: 06/09/1946 ?Referring Provider (PT): Perini ? ? ?Encounter Date: 12/01/2021 ? ? PT End of Session - 12/01/21 9373   ? ? Visit Number 29   ? Date for PT Re-Evaluation 12/06/21   ? Authorization Type UHC Medicare   ? PT Start Time [phone]   ? PT Stop Time [phone]   ? PT Time Calculation (min) 46 min   ? Activity Tolerance Patient tolerated treatment well   ? Behavior During Therapy [facility] for tasks assessed/performed   ? ?  ?  ? ?  ? ? ?Past Medical History:  ?Diagnosis Date  ? Anxiety   ? CAD (coronary artery disease)   ? a. CABG x 4 in 2006 in [place] (LIMA->LAD, VG->Diag, VG->OM, VG->RCA); b. DES to midbody of SVG-PDA/PLA 07/2012; c. 03/2015 Myoview: EF 65%, small defect of mild severit in basal inferolateral wall, low risk.  ? Complication of anesthesia   ? "quit breathing when I got ?Inovar" (07/28/2012)  ? Depression   ? Diverticulitis   ? Diverticulosis   ? Dyslipidemia   ? Intolerant of statins  ? Fecal incontinence   ? GERD (gastroesophageal reflux disease)   ? Hyperlipidemia   ? Hyperplastic colon polyp   ? IBS (irritable bowel syndrome)   ? Labile hypertension   ? a. 09/2016 Renal Artery duplex: nl renal arteries.  ? Migraines   ? "had them in my 30's" (07/28/2012)  ? Multiple allergies   ? Obesity   ? Steatohepatitis   ? Type II diabetes mellitus ([place])   ? a. no meds, diet controlled - takes cinnamon.  ? ? ?Past Surgical History:  ?Procedure Laterality Date  ? ABDOMINAL HYSTERECTOMY  1970's  ? BREAST LUMPECTOMY    ? bilateral  ? CARDIAC CATHETERIZATION  2006  ? CORONARY ANGIOPLASTY WITH STENT PLACEMENT  07/28/2012  ? "1; first time for me" (07/28/2012)  ? CORONARY ARTERY BYPASS GRAFT  2006  ? CABG X4  ? CORONARY STENT INTERVENTION N/A 03/01/2017  ? Procedure:  Coronary Stent Intervention;  Surgeon: Troy Sine, MD;  Location: [facility];  Service: Cardiovascular;  Laterality: N/A;  ? CORONARY STENT INTERVENTION N/A 06/23/2019  ? Procedure: CORONARY STENT INTERVENTION;  Surgeon: Martinique, Peter M, MD;  Location: [facility];  Service: Cardiovascular;  Laterality: N/A;  ? EXCISIONAL HEMORRHOIDECTOMY  1970's  ? INGUINAL HERNIA REPAIR  1970's?  ? left  ? LEFT HEART CATH AND CORS/GRAFTS ANGIOGRAPHY N/A 03/01/2017  ? Procedure: Left Heart Cath and Cors/Grafts Angiography;  Surgeon: Troy Sine, MD;  Location: [facility];  Service: Cardiovascular;  Laterality: N/A;  ? LEFT HEART CATH AND CORS/GRAFTS ANGIOGRAPHY N/A 06/22/2019  ? Procedure: LEFT HEART CATH AND CORS/GRAFTS ANGIOGRAPHY;  Surgeon: Belva Crome, MD;  Location: [facility];  Service: Cardiovascular;  Laterality: N/A;  ? LEFT HEART CATHETERIZATION WITH CORONARY/GRAFT ANGIOGRAM N/A 07/28/2012  ? Procedure: LEFT HEART CATHETERIZATION WITH Beatrix Fetters;  Surgeon: Burnell Blanks, MD;  Location: [facility] CATH LAB;  Service: Cardiovascular;  Laterality: N/A;  ? PERCUTANEOUS CORONARY STENT INTERVENTION (PCI-S)  07/28/2012  ? Procedure: PERCUTANEOUS CORONARY STENT INTERVENTION (PCI-S);  Surgeon: Burnell Blanks, MD;  Location: [facility] CATH LAB;  Service: Cardiovascular;;  ? TONSILLECTOMY AND ADENOIDECTOMY  ~ 1951  ? ? ?  There were no vitals filed for this visit. ? ? Subjective Assessment - 12/01/21 0843   ? ? Subjective Patient reports that last night her roof started leaking, and the roof was falling some, she reports that she is really stressed and is in pain now.   ? Currently in Pain? Yes   ? Pain Score 6    ? Pain Location Neck   ? Pain Orientation Right;Left   ? Pain Descriptors / Indicators Spasm;Tightness   ? Aggravating Factors  stress   ? ?  ?  ? ?  ? ? ? ? ? ? ? ? ? ? ? ? ? ? ? ? ? ? ? ? [facility] Adult PT Treatment/Exercise - 12/01/21 0001   ? ?  ? Manual Therapy  ?  Soft tissue mobilization to the right cervical area, right upper trap and right upper arm, to the left cervical and upper trap area, rhomboids   ? Passive ROM to the right shoulder, neck and ankles, toes   ? ?  ?  ? ?  ? ? ? ? ? ? ? ? ? ? ? ? PT Short Term Goals - 08/11/21 0905   ? ?  ? PT SHORT TERM GOAL #1  ? Title pt will be I with inital HEP   ? Status Achieved   ? ?  ?  ? ?  ? ? ? ? PT Long Term Goals - 11/26/21 0951   ? ?  ? PT LONG TERM GOAL #1  ? Title pt will be I with all advanced HEP   ? Status Partially Met   ?  ? PT LONG TERM GOAL #2  ? Title decrease pain overall 50%   ? Status Partially Met   ?  ? PT LONG TERM GOAL #3  ? Title report 50% less pain with driving   ? Status Achieved   ?  ? PT LONG TERM GOAL #4  ? Title increase cervical ROM 25%   ? Status Achieved   ?  ? PT LONG TERM GOAL #5  ? Title understand posture and body mechanics   ? Status Partially Met   ? ?  ?  ? ?  ? ? ? ? ? ? ? ? Plan - 12/01/21 0945   ? ? Clinical Impression Statement Patient had a roof leak and reports some of the inside roof came off last night, she reports increased stress and no sleep.  She comes in and is much tighter in the neck area. I reviewed the stretches with her to assure that she is doing this and focused on STM to the neck and upper trap area today   ? PT Next Visit Plan Will continue to work on the mms and start her exercises a little more to help her help herself in the future   ? Consulted and Agree with Plan of Care Patient   ? ?  ?  ? ?  ? ? ?Patient will benefit from skilled therapeutic intervention in order to improve the following deficits and impairments:  Decreased range of motion, Increased muscle spasms, Pain, Improper body mechanics, Decreased strength, Decreased mobility, Postural dysfunction ? ?Visit Diagnosis: ?Cervicalgia ? ?Radiculopathy, cervical region ? ?Cramp and spasm ? ?Difficulty in walking, not elsewhere classified ? ? ? ? ?Problem List ?Patient Active Problem List  ? Diagnosis Date  Noted  ? Dizziness 12/24/2020  ? Multiple drug allergies 12/24/2020  ? Dysuria 01/17/2020  ? Non-ST elevation (NSTEMI) myocardial  infarction [facility])   ? Chest pain 06/21/2019  ? IBS (irritable bowel syndrome) 06/21/2019  ? Hyperbilirubinemia 06/21/2019  ? GERD (gastroesophageal reflux disease) 06/21/2019  ? Low back pain 05/11/2019  ? Atopic dermatitis 04/03/2019  ? Other specified health status 08/10/2018  ? Encounter for general adult medical examination without abnormal findings 06/15/2018  ? Noninflammatory disorder of vagina 06/13/2018  ? Arthralgia of right temporomandibular joint 10/28/2017  ? Bilateral impacted cerumen 10/28/2017  ? Chronic otitis externa of left ear 10/28/2017  ? Frequency of micturition 09/28/2017  ? Benign neoplasm of colon 08/24/2017  ? Spinal stenosis of lumbar region 08/24/2017  ? Fall 04/27/2017  ? Abnormal nuclear stress test   ? Other stressful life events affecting family and household 01/26/2017  ? Diverticula of intestine 09/28/2016  ? Eructation 09/28/2016  ? Tinea unguium 06/24/2016  ? Neck pain 03/12/2016  ? Hypertensive heart disease 05/25/2015  ? Hyperglycemia due to type 2 diabetes mellitus ([place]) 03/22/2015  ? Pure hypercholesterolemia 03/22/2015  ? Type II or unspecified type diabetes mellitus without mention of complication, uncontrolled 02/14/2014  ? Hyponatremia 02/13/2014  ? HTN (hypertension) 02/13/2014  ? Sepsis secondary to UTI ([place]) 02/12/2014  ? Acute pyelonephritis 02/12/2014  ? Pain in left leg 11/13/2013  ? Candidiasis 09/12/2013  ? Impingement syndrome of left shoulder region 02/14/2013  ? Spasm 12/14/2012  ? Unstable angina ([place]) 07/29/2012  ? Obesity (BMI 30-39.9) 07/29/2012  ? Chronic ulcer of skin ([place]) 06/16/2012  ? Hemorrhoids 06/16/2012  ? Bloating 06/15/2012  ? Generalized abdominal pain 06/15/2012  ? Family history of colon cancer 06/15/2012  ? Breast lump 01/11/2012  ? Microscopic hematuria 01/11/2012  ? Myalgia 11/24/2011  ? 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 Coronary artery disease 12/30/2010  ? Hyperlipidemia with target LDL less than 70 12/30/2010  ? Diabetes mellitus ([place]) 12/30/2010  ? Enthesopathy of

## 2021-12-03 ENCOUNTER — Telehealth: Payer: Self-pay | Admitting: Cardiovascular Disease

## 2021-12-03 ENCOUNTER — Ambulatory Visit: Payer: Medicare Other | Admitting: Physical Therapy

## 2021-12-03 NOTE — Telephone Encounter (Signed)
Called patient, notified of PHARMD response.  ? ?Patient verbalized understanding.  ? ?Thankful for call back.  ? ?

## 2021-12-03 NOTE — Telephone Encounter (Signed)
Patient is calling back to follow up on call from this morning. She has not gotten any recommendations yet as to how to take her medicine and is getting concerned ?

## 2021-12-03 NOTE — Telephone Encounter (Signed)
Pt c/o BP issue: STAT if pt c/o blurred vision, one-sided weakness or slurred speech ? ?1. What are your last 5 BP readings? 160/80; 71with medication....pill makes her feel like she is in another world. 225/111; 104 without medication ? ?2. Are you having any other symptoms (ex. Dizziness, headache, blurred vision, passed out)? No  ? ?3. What is your BP issue? Blood pressure is really high without medication. Medication make patient feel spaced out or drunk  ?

## 2021-12-03 NOTE — Telephone Encounter (Signed)
Contacted patient, she states she has been having issues with her blood pressure. It is going up over night, and when she checks it in the morning it has been high- last night it was 225/111, HR 104. She states she is taking her blood pressure medications, however she started taking hydralazine 10 mg (this was not on her medication list).  She states the blood pressure numbers below are when she does take it, and then when she did not take it. She wanted to know if she can continue to take it with her other medications. Per last note from [facility] it states she was not taking the hydralazine- but patient states this is the only way she can get her blood pressure down. This morning her BP was 152/73 HR 60- after taking her medications. The last time she took hydralazine was at 7:00-8:00 last night.  ? ?These are her medications and when she takes them:  ?-Amlodipine 5 mg and 2.5 mg- she take 5 mg in the afternoon, and 2.5 mg in the evening before bed.  ?-Metoprolol 100 mg- she states she takes it around 7:30 in the morning, and at night around 8:00 PM.  ?-Valsartan 160 mg- she states she takes it at noon around 12, and again at 11:00 at night.  ? ?Patient is very anxious, and was crying on the phone that she is tired of having these issues. I did advise I could try to assist her in this, but we may have to have her come in for an appointment- next OV scheduled 05/18 with Dr.[name]. patient states she does have a lot of stress on her right now. We did discuss if any stroke symptoms occur or if she feels unsafe being at home when BP increases to call 911, patient states she will not go to hospital (but I did advise this would be the best treatment option during that time) patient verbalized understanding.  ? ?Aware I will call back as soon as I can to discuss recommendations.  ? ?

## 2021-12-03 NOTE — Telephone Encounter (Signed)
Yes I recommend she continue the hydralazine.  She can take up to three times a day to help her BP ?

## 2021-12-09 ENCOUNTER — Ambulatory Visit: Payer: Medicare Other | Attending: Neurological Surgery | Admitting: Physical Therapy

## 2021-12-09 ENCOUNTER — Encounter: Payer: Self-pay | Admitting: Physical Therapy

## 2021-12-09 DIAGNOSIS — R262 Difficulty in walking, not elsewhere classified: Secondary | ICD-10-CM | POA: Insufficient documentation

## 2021-12-09 DIAGNOSIS — M542 Cervicalgia: Secondary | ICD-10-CM | POA: Diagnosis present

## 2021-12-09 DIAGNOSIS — R252 Cramp and spasm: Secondary | ICD-10-CM | POA: Diagnosis present

## 2021-12-09 DIAGNOSIS — M5412 Radiculopathy, cervical region: Secondary | ICD-10-CM | POA: Diagnosis present

## 2021-12-09 NOTE — Therapy (Signed)
[facility] ?[address] ?[place], [place], [address] ?Phone: [phone]   Fax:  [phone] ?Progress Note ?Reporting Period 10/27/21 to 12/09/21 ? ?See note below for Objective Data and Assessment of Progress/Goals.  ? ?  ?Physical Therapy Treatment ? ?Patient Details  ?Name: Ruth [name] ?MRN: [id number] ?Date of Birth: [birth year] ?Referring Provider (PT): Perini ? ? ?Encounter Date: 12/09/2021 ? ? PT End of Session - 12/09/21 6010   ? ? Visit Number 30   ? Date for PT Re-Evaluation 01/08/22   ? Authorization Type UHC Medicare   ? PT Start Time [phone]   ? PT Stop Time [phone]   ? PT Time Calculation (min) 49 min   ? Activity Tolerance Patient tolerated treatment well   ? Behavior During Therapy [facility] for tasks assessed/performed   ? ?  ?  ? ?  ? ? ?Past Medical History:  ?Diagnosis Date  ? Anxiety   ? CAD (coronary artery disease)   ? a. CABG x 4 in 2006 in [place] (LIMA->LAD, VG->Diag, VG->OM, VG->RCA); b. DES to midbody of SVG-PDA/PLA 07/2012; c. 03/2015 Myoview: EF 65%, small defect of mild severit in basal inferolateral wall, low risk.  ? Complication of anesthesia   ? "quit breathing when I got ?Inovar" (07/28/2012)  ? Depression   ? Diverticulitis   ? Diverticulosis   ? Dyslipidemia   ? Intolerant of statins  ? Fecal incontinence   ? GERD (gastroesophageal reflux disease)   ? Hyperlipidemia   ? Hyperplastic colon polyp   ? IBS (irritable bowel syndrome)   ? Labile hypertension   ? a. 09/2016 Renal Artery duplex: nl renal arteries.  ? Migraines   ? "had them in my 30's" (07/28/2012)  ? Multiple allergies   ? Obesity   ? Steatohepatitis   ? Type II diabetes mellitus ([place])   ? a. no meds, diet controlled - takes cinnamon.  ? ? ?Past Surgical History:  ?Procedure Laterality Date  ? ABDOMINAL HYSTERECTOMY  1970's  ? BREAST LUMPECTOMY    ? bilateral  ? CARDIAC CATHETERIZATION  2006  ? CORONARY ANGIOPLASTY WITH STENT PLACEMENT  07/28/2012  ? "1; first time for me"  (07/28/2012)  ? CORONARY ARTERY BYPASS GRAFT  2006  ? CABG X4  ? CORONARY STENT INTERVENTION N/A 03/01/2017  ? Procedure: Coronary Stent Intervention;  Surgeon: Troy Sine, MD;  Location: [facility];  Service: Cardiovascular;  Laterality: N/A;  ? CORONARY STENT INTERVENTION N/A 06/23/2019  ? Procedure: CORONARY STENT INTERVENTION;  Surgeon: Martinique, Peter M, MD;  Location: [place] [facility];  Service: Cardiovascular;  Laterality: N/A;  ? EXCISIONAL HEMORRHOIDECTOMY  1970's  ? INGUINAL HERNIA REPAIR  1970's?  ? left  ? LEFT HEART CATH AND CORS/GRAFTS ANGIOGRAPHY N/A 03/01/2017  ? Procedure: Left Heart Cath and Cors/Grafts Angiography;  Surgeon: Troy Sine, MD;  Location: [place] [facility];  Service: Cardiovascular;  Laterality: N/A;  ? LEFT HEART CATH AND CORS/GRAFTS ANGIOGRAPHY N/A 06/22/2019  ? Procedure: LEFT HEART CATH AND CORS/GRAFTS ANGIOGRAPHY;  Surgeon: Belva Crome, MD;  Location: [place] [facility];  Service: Cardiovascular;  Laterality: N/A;  ? LEFT HEART CATHETERIZATION WITH CORONARY/GRAFT ANGIOGRAM N/A 07/28/2012  ? Procedure: LEFT HEART CATHETERIZATION WITH Beatrix Fetters;  Surgeon: Burnell Blanks, MD;  Location: [facility] CATH LAB;  Service: Cardiovascular;  Laterality: N/A;  ? PERCUTANEOUS CORONARY STENT INTERVENTION (PCI-S)  07/28/2012  ? Procedure: PERCUTANEOUS CORONARY STENT INTERVENTION (PCI-S);  Surgeon: Burnell Blanks, MD;  Location: [facility] CATH LAB;  Service: Cardiovascular;;  ? TONSILLECTOMY AND ADENOIDECTOMY  ~ 1951  ? ? ?There were no vitals filed for this visit. ? ? Subjective Assessment - 12/09/21 0839   ? ? Subjective Patient reports htat she has been taking wall paper off the walls and has been busy, tolerating the activity okay, she reports increased tensin and tightness in the neck and upper traps with the reaching up   ? Currently in Pain? Yes   ? Pain Score 7    ? Pain Location Neck   ? Pain Descriptors / Indicators Tightness;Spasm   ? ?  ?  ? ?   ? ? ? ? ? ? ? ? ? ? ? ? ? ? ? ? ? ? ? ? [facility] Adult PT Treatment/Exercise - 12/09/21 0001   ? ?  ? Manual Therapy  ? Soft tissue mobilization to the right cervical area, right upper trap and right upper arm, to the left cervical and upper trap area, rhomboids   ? Passive ROM to the right shoulder, neck and ankles, toes   ? ?  ?  ? ?  ? ? ? ? ? ? ? ? ? ? ? ? PT Short Term Goals - 08/11/21 0905   ? ?  ? PT SHORT TERM GOAL #1  ? Title pt will be I with inital HEP   ? Status Achieved   ? ?  ?  ? ?  ? ? ? ? PT Long Term Goals - 12/09/21 0929   ? ?  ? PT LONG TERM GOAL #1  ? Title pt will be I with all advanced HEP   ? Status Partially Met   ?  ? PT LONG TERM GOAL #2  ? Title decrease pain overall 50%   ? Status Partially Met   ? ?  ?  ? ?  ? ? ? ? ? ? ? ? Plan - 12/09/21 0923   ? ? Clinical Impression Statement Patient with increaed pain and tightness from taking wall paper off the walls, she reports some cramping in the upper traps and lateral shoulders, she was very tight today, she also had issues with stomach so we did not exercise per her request, her ROM is great and she is able to do so much more than when she started but still hurting with the activity   ? PT Next Visit Plan Will continue to work on the mms and start her exercises a little more to help her help herself in the future   ? Consulted and Agree with Plan of Care Patient   ? ?  ?  ? ?  ? ? ?Patient will benefit from skilled therapeutic intervention in order to improve the following deficits and impairments:  Decreased range of motion, Increased muscle spasms, Pain, Improper body mechanics, Decreased strength, Decreased mobility, Postural dysfunction ? ?Visit Diagnosis: ?Cervicalgia - Plan: PT plan of care cert/re-cert ? ?Radiculopathy, cervical region - Plan: PT plan of care cert/re-cert ? ?Cramp and spasm - Plan: PT plan of care cert/re-cert ? ?Difficulty in walking, not elsewhere classified - Plan: PT plan of care cert/re-cert ? ? ? ? ?Problem  List ?Patient Active Problem List  ? Diagnosis Date Noted  ? Dizziness 12/24/2020  ? Multiple drug allergies 12/24/2020  ? Dysuria 01/17/2020  ? Non-ST elevation (NSTEMI) myocardial infarction [facility])   ? Chest pain 06/21/2019  ? IBS (irritable bowel syndrome) 06/21/2019  ?  Hyperbilirubinemia 06/21/2019  ? GERD (gastroesophageal reflux disease) 06/21/2019  ? Low back pain 05/11/2019  ? Atopic dermatitis 04/03/2019  ? Other specified health status 08/10/2018  ? Encounter for general adult medical examination without abnormal findings 06/15/2018  ? Noninflammatory disorder of vagina 06/13/2018  ? Arthralgia of right temporomandibular joint 10/28/2017  ? Bilateral impacted cerumen 10/28/2017  ? Chronic otitis externa of left ear 10/28/2017  ? Frequency of micturition 09/28/2017  ? Benign neoplasm of colon 08/24/2017  ? Spinal stenosis of lumbar region 08/24/2017  ? Fall 04/27/2017  ? Abnormal nuclear stress test   ? Other stressful life events affecting family and household 01/26/2017  ? Diverticula of intestine 09/28/2016  ? Eructation 09/28/2016  ? Tinea unguium 06/24/2016  ? Neck pain 03/12/2016  ? Hypertensive heart disease 05/25/2015  ? Hyperglycemia due to type 2 diabetes mellitus ([place]) 03/22/2015  ? Pure hypercholesterolemia 03/22/2015  ? Type II or unspecified type diabetes mellitus without mention of complication, uncontrolled 02/14/2014  ? Hyponatremia 02/13/2014  ? HTN (hypertension) 02/13/2014  ? Sepsis secondary to UTI ([place]) 02/12/2014  ? Acute pyelonephritis 02/12/2014  ? Pain in left leg 11/13/2013  ? Candidiasis 09/12/2013  ? Impingement syndrome of left shoulder region 02/14/2013  ? Spasm 12/14/2012  ? Unstable angina ([place]) 07/29/2012  ? Obesity (BMI 30-39.9) 07/29/2012  ? Chronic ulcer of skin ([place]) 06/16/2012  ? Hemorrhoids 06/16/2012  ? Bloating 06/15/2012  ? Generalized abdominal pain 06/15/2012  ? Family history of colon cancer 06/15/2012  ? Breast lump 01/11/2012  ? Microscopic hematuria 01/11/2012   ? Myalgia 11/24/2011  ? 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 Coronary artery disease 12/30/2010  ? Hyperlipidemia with target LDL less than 70 12/30/2010  ? [place]

## 2021-12-11 ENCOUNTER — Other Ambulatory Visit: Payer: Self-pay | Admitting: Cardiovascular Disease

## 2021-12-18 ENCOUNTER — Encounter: Payer: Self-pay | Admitting: Physical Therapy

## 2021-12-18 ENCOUNTER — Ambulatory Visit: Payer: Medicare Other | Admitting: Physical Therapy

## 2021-12-18 DIAGNOSIS — M542 Cervicalgia: Secondary | ICD-10-CM

## 2021-12-18 DIAGNOSIS — R252 Cramp and spasm: Secondary | ICD-10-CM

## 2021-12-18 DIAGNOSIS — M5412 Radiculopathy, cervical region: Secondary | ICD-10-CM

## 2021-12-18 DIAGNOSIS — R262 Difficulty in walking, not elsewhere classified: Secondary | ICD-10-CM

## 2021-12-18 NOTE — Therapy (Signed)
[facility] ?[facility] ?[address] ?[place], [place], [address] ?Phone: [phone]   Fax:  [phone] ? ?Physical Therapy Treatment ? ?Patient Details  ?Name: Ruth [name] ?MRN: [id number] ?Date of Birth: 1946/07/08 ?Referring Provider (PT): Perini ? ? ?Encounter Date: 12/18/2021 ? ? PT End of Session - 12/18/21 0921   ? ? Visit Number 31   ? Date for PT Re-Evaluation 01/08/22   ? Authorization Type UHC Medicare   ? PT Start Time [phone]   ? PT Stop Time 5366   ? PT Time Calculation (min) 42 min   ? Activity Tolerance Patient tolerated treatment well   ? Behavior During Therapy [facility] for tasks assessed/performed   ? ?  ?  ? ?  ? ? ?Past Medical History:  ?Diagnosis Date  ? Anxiety   ? CAD (coronary artery disease)   ? a. CABG x 4 in 2006 in [place] (LIMA->LAD, VG->Diag, VG->OM, VG->RCA); b. DES to midbody of SVG-PDA/PLA 07/2012; c. 03/2015 Myoview: EF 65%, small defect of mild severit in basal inferolateral wall, low risk.  ? Complication of anesthesia   ? "quit breathing when I got ?Inovar" (07/28/2012)  ? Depression   ? Diverticulitis   ? Diverticulosis   ? Dyslipidemia   ? Intolerant of statins  ? Fecal incontinence   ? GERD (gastroesophageal reflux disease)   ? Hyperlipidemia   ? Hyperplastic colon polyp   ? IBS (irritable bowel syndrome)   ? Labile hypertension   ? a. 09/2016 Renal Artery duplex: nl renal arteries.  ? Migraines   ? "had them in my 30's" (07/28/2012)  ? Multiple allergies   ? Obesity   ? Steatohepatitis   ? Type II diabetes mellitus ([place])   ? a. no meds, diet controlled - takes cinnamon.  ? ? ?Past Surgical History:  ?Procedure Laterality Date  ? ABDOMINAL HYSTERECTOMY  1970's  ? BREAST LUMPECTOMY    ? bilateral  ? CARDIAC CATHETERIZATION  2006  ? CORONARY ANGIOPLASTY WITH STENT PLACEMENT  07/28/2012  ? "1; first time for me" (07/28/2012)  ? CORONARY ARTERY BYPASS GRAFT  2006  ? CABG X4  ? CORONARY STENT INTERVENTION N/A 03/01/2017  ? Procedure:  Coronary Stent Intervention;  Surgeon: Troy Sine, MD;  Location: [place] [facility];  Service: Cardiovascular;  Laterality: N/A;  ? CORONARY STENT INTERVENTION N/A 06/23/2019  ? Procedure: CORONARY STENT INTERVENTION;  Surgeon: Martinique, Peter M, MD;  Location: [facility] [facility];  Service: Cardiovascular;  Laterality: N/A;  ? EXCISIONAL HEMORRHOIDECTOMY  1970's  ? INGUINAL HERNIA REPAIR  1970's?  ? left  ? LEFT HEART CATH AND CORS/GRAFTS ANGIOGRAPHY N/A 03/01/2017  ? Procedure: Left Heart Cath and Cors/Grafts Angiography;  Surgeon: Troy Sine, MD;  Location: [place];  Service: Cardiovascular;  Laterality: N/A;  ? LEFT HEART CATH AND CORS/GRAFTS ANGIOGRAPHY N/A 06/22/2019  ? Procedure: LEFT HEART CATH AND CORS/GRAFTS ANGIOGRAPHY;  Surgeon: Belva Crome, MD;  Location: [facility];  Service: Cardiovascular;  Laterality: N/A;  ? LEFT HEART CATHETERIZATION WITH CORONARY/GRAFT ANGIOGRAM N/A 07/28/2012  ? Procedure: LEFT HEART CATHETERIZATION WITH Beatrix Fetters;  Surgeon: Burnell Blanks, MD;  Location: [facility] CATH LAB;  Service: Cardiovascular;  Laterality: N/A;  ? PERCUTANEOUS CORONARY STENT INTERVENTION (PCI-S)  07/28/2012  ? Procedure: PERCUTANEOUS CORONARY STENT INTERVENTION (PCI-S);  Surgeon: Burnell Blanks, MD;  Location: [facility] CATH LAB;  Service: Cardiovascular;;  ? TONSILLECTOMY AND ADENOIDECTOMY  ~ 1951  ? ? ?  There were no vitals filed for this visit. ? ? Subjective Assessment - 12/18/21 0843   ? ? Subjective Patient reports that she is really hurting in the shoulders and neck, last Friday she had her bathroom painted, reports that the painter opened the window from the top, she reports that he left it open and that night it was cold and started raining and she tried to push it up to close it.  This caused significant pain in the neck and the upper traps, reports pain was a 9/10.   ? Currently in Pain? Yes   ? Pain Score 8    ? Pain Location Neck   ? Pain  Descriptors / Indicators Sore;Tightness   ? Aggravating Factors  closing window where I had to push it up   ? ?  ?  ? ?  ? ? ? ? ? ? ? ? ? ? ? ? ? ? ? ? ? ? ? ? [facility] Adult PT Treatment/Exercise - 12/18/21 0001   ? ?  ? Manual Therapy  ? Soft tissue mobilization to the right cervical area, right upper trap and right upper arm, to the left cervical and upper trap area, rhomboids   ? Passive ROM to the right shoulder, neck and ankles, toes   ? ?  ?  ? ?  ? ? ? ? ? ? ? ? ? ? ? ? PT Short Term Goals - 08/11/21 0905   ? ?  ? PT SHORT TERM GOAL #1  ? Title pt will be I with inital HEP   ? Status Achieved   ? ?  ?  ? ?  ? ? ? ? PT Long Term Goals - 12/18/21 0923   ? ?  ? PT LONG TERM GOAL #1  ? Title pt will be I with all advanced HEP   ? Status Partially Met   ?  ? PT LONG TERM GOAL #2  ? Title decrease pain overall 50%   ? Status Partially Met   ? ?  ?  ? ?  ? ? ? ? ? ? ? ? Plan - 12/18/21 0921   ? ? Clinical Impression Statement Patient reports significant incresaed neck and shoulder pain after trying to push up on a window to close it, her neck ROm is still good but painful, the left shoulder is limited seems to have good strength but painful, hopefully she just has a mm strain from doing something different but this is defintiely a set back with the pain and ROM   ? PT Next Visit Plan Will continue to work on the mms and start her exercises a little more to help her help herself in the future   ? Consulted and Agree with Plan of Care Patient   ? ?  ?  ? ?  ? ? ?Patient will benefit from skilled therapeutic intervention in order to improve the following deficits and impairments:  Decreased range of motion, Increased muscle spasms, Pain, Improper body mechanics, Decreased strength, Decreased mobility, Postural dysfunction ? ?Visit Diagnosis: ?Cervicalgia ? ?Radiculopathy, cervical region ? ?Cramp and spasm ? ?Difficulty in walking, not elsewhere classified ? ? ? ? ?Problem List ?Patient Active Problem List  ? Diagnosis  Date Noted  ? Dizziness 12/24/2020  ? Multiple drug allergies 12/24/2020  ? Dysuria 01/17/2020  ? Non-ST elevation (NSTEMI) myocardial infarction [facility])   ? Chest pain 06/21/2019  ? IBS (irritable bowel syndrome) 06/21/2019  ? Hyperbilirubinemia 06/21/2019  ?  GERD (gastroesophageal reflux disease) 06/21/2019  ? Low back pain 05/11/2019  ? Atopic dermatitis 04/03/2019  ? Other specified health status 08/10/2018  ? Encounter for general adult medical examination without abnormal findings 06/15/2018  ? Noninflammatory disorder of vagina 06/13/2018  ? Arthralgia of right temporomandibular joint 10/28/2017  ? Bilateral impacted cerumen 10/28/2017  ? Chronic otitis externa of left ear 10/28/2017  ? Frequency of micturition 09/28/2017  ? Benign neoplasm of colon 08/24/2017  ? Spinal stenosis of lumbar region 08/24/2017  ? Fall 04/27/2017  ? Abnormal nuclear stress test   ? Other stressful life events affecting family and household 01/26/2017  ? Diverticula of intestine 09/28/2016  ? Eructation 09/28/2016  ? Tinea unguium 06/24/2016  ? Neck pain 03/12/2016  ? Hypertensive heart disease 05/25/2015  ? Hyperglycemia due to type 2 diabetes mellitus ([place]) 03/22/2015  ? Pure hypercholesterolemia 03/22/2015  ? Type II or unspecified type diabetes mellitus without mention of complication, uncontrolled 02/14/2014  ? Hyponatremia 02/13/2014  ? HTN (hypertension) 02/13/2014  ? Sepsis secondary to UTI ([place]) 02/12/2014  ? Acute pyelonephritis 02/12/2014  ? Pain in left leg 11/13/2013  ? Candidiasis 09/12/2013  ? Impingement syndrome of left shoulder region 02/14/2013  ? Spasm 12/14/2012  ? Unstable angina ([place]) 07/29/2012  ? Obesity (BMI 30-39.9) 07/29/2012  ? Chronic ulcer of skin ([place]) 06/16/2012  ? Hemorrhoids 06/16/2012  ? Bloating 06/15/2012  ? Generalized abdominal pain 06/15/2012  ? Family history of colon cancer 06/15/2012  ? Breast lump 01/11/2012  ? Microscopic hematuria 01/11/2012  ? Myalgia 11/24/2011  ? 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 Coronary artery disease 12/30/2010  ? Hyperlipidemia with target LDL less than 70 12/30/2010  ? Diabetes mellitus ([place]) 12/30/2010  ? Enthesopathy of hi

## 2021-12-23 ENCOUNTER — Encounter: Payer: Self-pay | Admitting: Physical Therapy

## 2021-12-23 ENCOUNTER — Ambulatory Visit: Payer: Medicare Other | Admitting: Physical Therapy

## 2021-12-23 DIAGNOSIS — M542 Cervicalgia: Secondary | ICD-10-CM | POA: Diagnosis not present

## 2021-12-23 DIAGNOSIS — M5412 Radiculopathy, cervical region: Secondary | ICD-10-CM

## 2021-12-23 DIAGNOSIS — R252 Cramp and spasm: Secondary | ICD-10-CM

## 2021-12-23 DIAGNOSIS — R262 Difficulty in walking, not elsewhere classified: Secondary | ICD-10-CM

## 2021-12-23 NOTE — Therapy (Signed)
[facility] ?[place] ?[address] ?[place], [place], [address] ?Phone: [phone]   Fax:  [phone] ? ?Physical Therapy Treatment ? ?Patient Details  ?Name: Ruth [name] ?MRN: [id number] ?Date of Birth: 10-Oct-1945 ?Referring Provider (PT): Perini ? ? ?Encounter Date: 12/23/2021 ? ? PT End of Session - 12/23/21 0932   ? ? Visit Number 32   ? Date for PT Re-Evaluation 01/08/22   ? Authorization Type UHC Medicare   ? PT Start Time [phone]   ? PT Stop Time [phone]   ? PT Time Calculation (min) 43 min   ? Activity Tolerance Patient tolerated treatment well   ? Behavior During Therapy [facility] for tasks assessed/performed   ? ?  ?  ? ?  ? ? ?Past Medical History:  ?Diagnosis Date  ? Anxiety   ? CAD (coronary artery disease)   ? a. CABG x 4 in 2006 in [place] (LIMA->LAD, VG->Diag, VG->OM, VG->RCA); b. DES to midbody of SVG-PDA/PLA 07/2012; c. 03/2015 Myoview: EF 65%, small defect of mild severit in basal inferolateral wall, low risk.  ? Complication of anesthesia   ? "quit breathing when I got ?Inovar" (07/28/2012)  ? Depression   ? Diverticulitis   ? Diverticulosis   ? Dyslipidemia   ? Intolerant of statins  ? Fecal incontinence   ? GERD (gastroesophageal reflux disease)   ? Hyperlipidemia   ? Hyperplastic colon polyp   ? IBS (irritable bowel syndrome)   ? Labile hypertension   ? a. 09/2016 Renal Artery duplex: nl renal arteries.  ? Migraines   ? "had them in my 30's" (07/28/2012)  ? Multiple allergies   ? Obesity   ? Steatohepatitis   ? Type II diabetes mellitus ([place])   ? a. no meds, diet controlled - takes cinnamon.  ? ? ?Past Surgical History:  ?Procedure Laterality Date  ? ABDOMINAL HYSTERECTOMY  1970's  ? BREAST LUMPECTOMY    ? bilateral  ? CARDIAC CATHETERIZATION  2006  ? CORONARY ANGIOPLASTY WITH STENT PLACEMENT  07/28/2012  ? "1; first time for me" (07/28/2012)  ? CORONARY ARTERY BYPASS GRAFT  2006  ? CABG X4  ? CORONARY STENT INTERVENTION N/A 03/01/2017  ? Procedure:  Coronary Stent Intervention;  Surgeon: Troy Sine, MD;  Location: [facility];  Service: Cardiovascular;  Laterality: N/A;  ? CORONARY STENT INTERVENTION N/A 06/23/2019  ? Procedure: CORONARY STENT INTERVENTION;  Surgeon: Martinique, Peter M, MD;  Location: [place] [facility];  Service: Cardiovascular;  Laterality: N/A;  ? EXCISIONAL HEMORRHOIDECTOMY  1970's  ? INGUINAL HERNIA REPAIR  1970's?  ? left  ? LEFT HEART CATH AND CORS/GRAFTS ANGIOGRAPHY N/A 03/01/2017  ? Procedure: Left Heart Cath and Cors/Grafts Angiography;  Surgeon: Troy Sine, MD;  Location: [facility] [facility];  Service: Cardiovascular;  Laterality: N/A;  ? LEFT HEART CATH AND CORS/GRAFTS ANGIOGRAPHY N/A 06/22/2019  ? Procedure: LEFT HEART CATH AND CORS/GRAFTS ANGIOGRAPHY;  Surgeon: Belva Crome, MD;  Location: [place] [facility];  Service: Cardiovascular;  Laterality: N/A;  ? LEFT HEART CATHETERIZATION WITH CORONARY/GRAFT ANGIOGRAM N/A 07/28/2012  ? Procedure: LEFT HEART CATHETERIZATION WITH Beatrix Fetters;  Surgeon: Burnell Blanks, MD;  Location[facility] CATH LAB;  Service: Cardiovascular;  Laterality: N/A;  ? PERCUTANEOUS CORONARY STENT INTERVENTION (PCI-S)  07/28/2012  ? Procedure: PERCUTANEOUS CORONARY STENT INTERVENTION (PCI-S);  Surgeon: Burnell Blanks, MD;  Location: [facility] CATH LAB;  Service: Cardiovascular;;  ? TONSILLECTOMY AND ADENOIDECTOMY  ~ 1951  ? ? ?  There were no vitals filed for this visit. ? ? Subjective Assessment - 12/23/21 0842   ? ? Subjective I finished taking down some wall paper and my shoulders and neck are really sore   ? Currently in Pain? Yes   ? Pain Score 7    ? Pain Location Neck   ? ?  ?  ? ?  ? ? ? ? ? ? ? ? ? ? ? ? ? ? ? ? ? ? ? ? [facility] Adult PT Treatment/Exercise - 12/23/21 0001   ? ?  ? Neck Exercises: Theraband  ? Shoulder Extension 20 reps;Red   ? Rows 20 reps;Red   ? Shoulder External Rotation 20 reps;Red   ? Horizontal ABduction 20 reps;Red   ?  ? Neck Exercises: Seated  ? Neck  Retraction 10 reps   ? Shoulder Shrugs 20 reps   ?  ? Manual Therapy  ? Soft tissue mobilization to the right cervical area, right upper trap and right upper arm, to the left cervical and upper trap area, rhomboids   ? Passive ROM to the right shoulder, neck and ankles, toes   ? ?  ?  ? ?  ? ? ? ? ? ? ? ? ? ? ? ? PT Short Term Goals - 08/11/21 0905   ? ?  ? PT SHORT TERM GOAL #1  ? Title pt will be I with inital HEP   ? Status Achieved   ? ?  ?  ? ?  ? ? ? ? PT Long Term Goals - 12/23/21 0934   ? ?  ? PT LONG TERM GOAL #1  ? Title pt will be I with all advanced HEP   ? Status Partially Met   ? ?  ?  ? ?  ? ? ? ? ? ? ? ? Plan - 12/23/21 0933   ? ? Clinical Impression Statement Looking at her doing more at home and self care activities to help her continue to progress without PT,  She has been doing a lot at home and is hurting more but is surprised that she has been able to do the stuff   ? PT Next Visit Plan Will continue to work on the mms and start her exercises a little more to help her help herself in the future   ? Consulted and Agree with Plan of Care Patient   ? ?  ?  ? ?  ? ? ?Patient will benefit from skilled therapeutic intervention in order to improve the following deficits and impairments:  Decreased range of motion, Increased muscle spasms, Pain, Improper body mechanics, Decreased strength, Decreased mobility, Postural dysfunction ? ?Visit Diagnosis: ?Cervicalgia ? ?Radiculopathy, cervical region ? ?Cramp and spasm ? ?Difficulty in walking, not elsewhere classified ? ? ? ? ?Problem List ?Patient Active Problem List  ? Diagnosis Date Noted  ? Dizziness 12/24/2020  ? Multiple drug allergies 12/24/2020  ? Dysuria 01/17/2020  ? Non-ST elevation (NSTEMI) myocardial infarction [facility])   ? Chest pain 06/21/2019  ? IBS (irritable bowel syndrome) 06/21/2019  ? Hyperbilirubinemia 06/21/2019  ? GERD (gastroesophageal reflux disease) 06/21/2019  ? Low back pain 05/11/2019  ? Atopic dermatitis 04/03/2019  ? Other  specified health status 08/10/2018  ? Encounter for general adult medical examination without abnormal findings 06/15/2018  ? Noninflammatory disorder of vagina 06/13/2018  ? Arthralgia of right temporomandibular joint 10/28/2017  ? Bilateral impacted cerumen 10/28/2017  ? Chronic otitis externa of left ear 10/28/2017  ?  Frequency of micturition 09/28/2017  ? Benign neoplasm of colon 08/24/2017  ? Spinal stenosis of lumbar region 08/24/2017  ? Fall 04/27/2017  ? Abnormal nuclear stress test   ? Other stressful life events affecting family and household 01/26/2017  ? Diverticula of intestine 09/28/2016  ? Eructation 09/28/2016  ? Tinea unguium 06/24/2016  ? Neck pain 03/12/2016  ? Hypertensive heart disease 05/25/2015  ? Hyperglycemia due to type 2 diabetes mellitus ([place]) 03/22/2015  ? Pure hypercholesterolemia 03/22/2015  ? Type II or unspecified type diabetes mellitus without mention of complication, uncontrolled 02/14/2014  ? Hyponatremia 02/13/2014  ? HTN (hypertension) 02/13/2014  ? Sepsis secondary to UTI ([place]) 02/12/2014  ? Acute pyelonephritis 02/12/2014  ? Pain in left leg 11/13/2013  ? Candidiasis 09/12/2013  ? Impingement syndrome of left shoulder region 02/14/2013  ? Spasm 12/14/2012  ? Unstable angina ([place]) 07/29/2012  ? Obesity (BMI 30-39.9) 07/29/2012  ? Chronic ulcer of skin ([place]) 06/16/2012  ? Hemorrhoids 06/16/2012  ? Bloating 06/15/2012  ? Generalized abdominal pain 06/15/2012  ? Family history of colon cancer 06/15/2012  ? Breast lump 01/11/2012  ? Microscopic hematuria 01/11/2012  ? Myalgia 11/24/2011  ? 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 Coronary artery disease 12/30/2010  ? Hyperlipidemia with target LDL less than 70 12/30/2010  ? Diabetes mellitus ([place]) 12/30/2010  ? Enthesopathy of hip region 11/29/2009  ? RECTAL INCONTINENCE 06/13/2008  ? ? Sumner Boast, PT ?12/23/2021, 9:34 AM ? ?[facility] ?[place] ?[address] ?[place], [place], [address] ?Phone: [phone]   Fax:  [phone] ? ?Name: Ruth [name] ?MRN: [id number] ?Date of Birth: Aug 27, 1946 ? ? ? ?

## 2022-01-01 ENCOUNTER — Ambulatory Visit: Payer: Medicare Other | Admitting: Physical Therapy

## 2022-01-01 ENCOUNTER — Encounter: Payer: Self-pay | Admitting: Physical Therapy

## 2022-01-01 DIAGNOSIS — M5412 Radiculopathy, cervical region: Secondary | ICD-10-CM

## 2022-01-01 DIAGNOSIS — R262 Difficulty in walking, not elsewhere classified: Secondary | ICD-10-CM

## 2022-01-01 DIAGNOSIS — M542 Cervicalgia: Secondary | ICD-10-CM | POA: Diagnosis not present

## 2022-01-01 DIAGNOSIS — R252 Cramp and spasm: Secondary | ICD-10-CM

## 2022-01-01 NOTE — Therapy (Signed)
[facility] ?[address] ?[place], [place], [address] ?Phone: [phone]   Fax:  [phone] ? ?Physical Therapy Treatment ? ?Patient Details  ?Name: Ruth [name] ?MRN: [id number] ?Date of Birth: 06/28/1946 ?Referring Provider (PT): Perini ? ? ?Encounter Date: 01/01/2022 ? ? PT End of Session - 01/01/22 0925   ? ? Visit Number 33   ? Authorization Type UHC Medicare   ? PT Start Time 0838   ? PT Stop Time 0921   ? PT Time Calculation (min) 43 min   ? Activity Tolerance Patient tolerated treatment well   ? Behavior During Therapy [facility] for tasks assessed/performed   ? ?  ?  ? ?  ? ? ?Past Medical History:  ?Diagnosis Date  ? Anxiety   ? CAD (coronary artery disease)   ? a. CABG x 4 in 2006 in [place] (LIMA->LAD, VG->Diag, VG->OM, VG->RCA); b. DES to midbody of SVG-PDA/PLA 07/2012; c. 03/2015 Myoview: EF 65%, small defect of mild severit in basal inferolateral wall, low risk.  ? Complication of anesthesia   ? "quit breathing when I got ?Inovar" (07/28/2012)  ? Depression   ? Diverticulitis   ? Diverticulosis   ? Dyslipidemia   ? Intolerant of statins  ? Fecal incontinence   ? GERD (gastroesophageal reflux disease)   ? Hyperlipidemia   ? Hyperplastic colon polyp   ? IBS (irritable bowel syndrome)   ? Labile hypertension   ? a. 09/2016 Renal Artery duplex: nl renal arteries.  ? Migraines   ? "had them in my 30's" (07/28/2012)  ? Multiple allergies   ? Obesity   ? Steatohepatitis   ? Type II diabetes mellitus ([facility])   ? a. no meds, diet controlled - takes cinnamon.  ? ? ?Past Surgical History:  ?Procedure Laterality Date  ? ABDOMINAL HYSTERECTOMY  1970's  ? BREAST LUMPECTOMY    ? bilateral  ? CARDIAC CATHETERIZATION  2006  ? CORONARY ANGIOPLASTY WITH STENT PLACEMENT  07/28/2012  ? "1; first time for me" (07/28/2012)  ? CORONARY ARTERY BYPASS GRAFT  2006  ? CABG X4  ? CORONARY STENT INTERVENTION N/A 03/01/2017  ? Procedure: Coronary Stent Intervention;  Surgeon:  [name], [name] A, MD;  Location: [facility];  Service: Cardiovascular;  Laterality: N/A;  ? CORONARY STENT INTERVENTION N/A 06/23/2019  ? Procedure: CORONARY STENT INTERVENTION;  Surgeon: Jordan, Peter M, MD;  Location: [facility];  Service: Cardiovascular;  Laterality: N/A;  ? EXCISIONAL HEMORRHOIDECTOMY  1970's  ? INGUINAL HERNIA REPAIR  1970's?  ? left  ? LEFT HEART CATH AND CORS/GRAFTS ANGIOGRAPHY N/A 03/01/2017  ? Procedure: Left Heart Cath and Cors/Grafts Angiography;  Surgeon: [name], [name] A, MD;  Location: [facility];  Service: Cardiovascular;  Laterality: N/A;  ? LEFT HEART CATH AND CORS/GRAFTS ANGIOGRAPHY N/A 06/22/2019  ? Procedure: LEFT HEART CATH AND CORS/GRAFTS ANGIOGRAPHY;  Surgeon: Smith, Henry W, MD;  Location: [facility];  Service: Cardiovascular;  Laterality: N/A;  ? LEFT HEART CATHETERIZATION WITH CORONARY/GRAFT ANGIOGRAM N/A 07/28/2012  ? Procedure: LEFT HEART CATHETERIZATION WITH CORONARY/GRAFT ANGIOGRAM;  Surgeon: Christopher D McAlhany, MD;  Location: [facility];  Service: Cardiovascular;  Laterality: N/A;  ? PERCUTANEOUS CORONARY STENT INTERVENTION (PCI-S)  07/28/2012  ? Procedure: PERCUTANEOUS CORONARY STENT INTERVENTION (PCI-S);  Surgeon: Christopher D McAlhany, MD;  Location: [facility];  Service: Cardiovascular;;  ? TONSILLECTOMY AND ADENOIDECTOMY  ~ 1951  ? ? ?There were no vitals filed   for this visit. ? ? Subjective Assessment - 01/01/22 0837   ? ? Subjective Patient reports that earlier this week she had a fall at [employer], she reports that she landed on her knees and jarred her back and neck.  She reports increaed pain   ? Currently in Pain? Yes   ? Pain Score 7    ? Pain Location Neck   ? Pain Descriptors / Indicators Sore;Spasm   ? Aggravating Factors  falling hurt me   ? ?  ?  ? ?  ? ? ? ? ? ? ? ? ? ? ? ? ? ? ? ? ? ? ? ? [facility] Adult PT Treatment/Exercise - 01/01/22 0001   ? ?  ? Manual Therapy  ? Soft tissue mobilization to the right cervical area,  right upper trap and right upper arm, to the left cervical and upper trap area, rhomboids   ? ?  ?  ? ?  ? ? ? ? ? ? ? ? ? ? PT Education - 01/01/22 0924   ? ? Education Details Went over all HEP and form, reps, posture and body mechanics for ADL's, PT demo and patient verbalized understanding, went over stretches to do for the neck and the legs   ? Person(s) Educated Patient   ? Methods Explanation;Demonstration;Tactile cues;Verbal cues   ? Comprehension Verbalized understanding;Returned demonstration;Verbal cues required   ? ?  ?  ? ?  ? ? ? PT Short Term Goals - 08/11/21 0905   ? ?  ? PT SHORT TERM GOAL #1  ? Title pt will be I with inital HEP   ? Status Achieved   ? ?  ?  ? ?  ? ? ? ? PT Long Term Goals - 01/01/22 1007   ? ?  ? PT LONG TERM GOAL #1  ? Title pt will be I with all advanced HEP   ? Status Achieved   ?  ? PT LONG TERM GOAL #2  ? Title decrease pain overall 50%   ? Status Achieved   ?  ? PT LONG TERM GOAL #3  ? Title report 50% less pain with driving   ? Status Achieved   ?  ? PT LONG TERM GOAL #4  ? Title increase cervical ROM 25%   ? Status Achieved   ?  ? PT LONG TERM GOAL #5  ? Title understand posture and body mechanics   ? Status Achieved   ? ?  ?  ? ?  ? ? ? ? ? ? ? ? Plan - 01/01/22 0925   ? ? Clinical Impression Statement This was going to be Avani's last visit, howver she had a fall, I think we will still make it her last visit but She was more tight and tender today, limping some as well, overall she has made good progress with her ability to do things around the house and in the yard some with less pain, she is still tight and has tension in the upper traps and the neck   ? PT Next Visit Plan D/C most goals met   ? Consulted and Agree with Plan of Care Patient   ? ?  ?  ? ?  ? ? ?Patient will benefit from skilled therapeutic intervention in order to improve the following deficits and impairments:  Decreased range of motion, Increased muscle spasms, Pain, Improper body mechanics, Decreased  strength, Decreased mobility, Postural dysfunction ? ?Visit Diagnosis: ?Cervicalgia ? ?Radiculopathy,   cervical region ? ?Cramp and spasm ? ?Difficulty in walking, not elsewhere classified ? ? ? ? ?Problem List ?Patient Active Problem List  ? Diagnosis Date Noted  ? Dizziness 12/24/2020  ? Multiple drug allergies 12/24/2020  ? Dysuria 01/17/2020  ? Non-ST elevation (NSTEMI) myocardial infarction ([facility])   ? Chest pain 06/21/2019  ? IBS (irritable bowel syndrome) 06/21/2019  ? Hyperbilirubinemia 06/21/2019  ? GERD (gastroesophageal reflux disease) 06/21/2019  ? Low back pain 05/11/2019  ? Atopic dermatitis 04/03/2019  ? Other specified health status 08/10/2018  ? Encounter for general adult medical examination without abnormal findings 06/15/2018  ? Noninflammatory disorder of vagina 06/13/2018  ? Arthralgia of right temporomandibular joint 10/28/2017  ? Bilateral impacted cerumen 10/28/2017  ? Chronic otitis externa of left ear 10/28/2017  ? Frequency of micturition 09/28/2017  ? Benign neoplasm of colon 08/24/2017  ? Spinal stenosis of lumbar region 08/24/2017  ? Fall 04/27/2017  ? Abnormal nuclear stress test   ? Other stressful life events affecting family and household 01/26/2017  ? Diverticula of intestine 09/28/2016  ? Eructation 09/28/2016  ? Tinea unguium 06/24/2016  ? Neck pain 03/12/2016  ? Hypertensive heart disease 05/25/2015  ? Hyperglycemia due to type 2 diabetes mellitus (HCC) 03/22/2015  ? Pure hypercholesterolemia 03/22/2015  ? Type II or unspecified type diabetes mellitus without mention of complication, uncontrolled 02/14/2014  ? Hyponatremia 02/13/2014  ? HTN (hypertension) 02/13/2014  ? Sepsis secondary to UTI ([facility]) 02/12/2014  ? Acute pyelonephritis 02/12/2014  ? Pain in left leg 11/13/2013  ? Candidiasis 09/12/2013  ? Impingement syndrome of left shoulder region 02/14/2013  ? Spasm 12/14/2012  ? Unstable angina ([facility]) 07/29/2012  ? Obesity (BMI 30-39.9) 07/29/2012  ? Chronic ulcer of skin ([facility]CC)  06/16/2012  ? Hemorrhoids 06/16/2012  ? Bloating 06/15/2012  ? Generalized abdominal pain 06/15/2012  ? Family history of colon cancer 06/15/2012  ? Breast lump 01/11/2012  ? Microscopic hematuria 01/11/19

## 2022-01-19 NOTE — Telephone Encounter (Signed)
We received a call from patient seeking advise. Per patient, has had bloating and gas pain for 3 days. For relief patient took gas x pills, patient states that helped a little but is still hurting. Patient wants to know if there is anything else she can do? ?

## 2022-01-19 NOTE — Telephone Encounter (Signed)
Last OV 07/23/21 for generalized abd pain. CT and labs recommended at the time of that OV. Labs unremarkable, CT did not explain reason for abd pain. Per Carl Best, CRNP, pt was advised to take Miralax QHS or low dose Linzess if Miralax ineffective. Pt was also advised to eat bland diet and push fluids, proceed to ED for severe abd pain. ? ?Called pt to inquire further. Denies having severe abd pain but is having a lot of gas. States she is taking OTC GasX. Does have 1 large formed stool daily and admits she needs to manually remove stool to promote daily bowel habits. States after she defecates, her gas pain is relieved but quickly returns. States she drinks prune juice daily along with fluids. States she will not return to the ED as she had in 2022 because she "had a very bad experience". 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 Pt has been scheduled for an OV with Tye Savoy, NP 01/23/22 @ 130pm. Advised the recommendation will still stand as previously advised by Carl Best, CRNP, ?Proceed to the ED for severe abd pain ?May take 1 dose of Miralax qhs ?If Miralax is ineffective, may trial low dose Linzess ?Continue to drink at least 64 ounces of fluid daily ?

## 2022-01-21 ENCOUNTER — Encounter: Payer: Self-pay | Admitting: *Deleted

## 2022-01-22 ENCOUNTER — Ambulatory Visit: Payer: Medicare Other | Admitting: Cardiovascular Disease

## 2022-01-23 ENCOUNTER — Ambulatory Visit (INDEPENDENT_AMBULATORY_CARE_PROVIDER_SITE_OTHER): Payer: Medicare Other | Admitting: Cardiovascular Disease

## 2022-01-23 ENCOUNTER — Encounter: Payer: Self-pay | Admitting: Cardiovascular Disease

## 2022-01-23 ENCOUNTER — Ambulatory Visit: Payer: Medicare Other | Admitting: Nurse Practitioner

## 2022-01-23 VITALS — BP 138/82 | HR 61 | Ht 61.5 in | Wt 184.6 lb

## 2022-01-23 DIAGNOSIS — I10 Essential (primary) hypertension: Secondary | ICD-10-CM | POA: Diagnosis not present

## 2022-01-23 DIAGNOSIS — I2510 Atherosclerotic heart disease of native coronary artery without angina pectoris: Secondary | ICD-10-CM | POA: Diagnosis not present

## 2022-01-23 DIAGNOSIS — M47812 Spondylosis without myelopathy or radiculopathy, cervical region: Secondary | ICD-10-CM

## 2022-01-23 DIAGNOSIS — K5790 Diverticulosis of intestine, part unspecified, without perforation or abscess without bleeding: Secondary | ICD-10-CM | POA: Diagnosis not present

## 2022-01-23 DIAGNOSIS — Z789 Other specified health status: Secondary | ICD-10-CM

## 2022-01-23 DIAGNOSIS — E785 Hyperlipidemia, unspecified: Secondary | ICD-10-CM | POA: Diagnosis not present

## 2022-01-23 DIAGNOSIS — Z79899 Other long term (current) drug therapy: Secondary | ICD-10-CM

## 2022-01-23 MED ORDER — AMLODIPINE BESYLATE 5 MG PO TABS: ORAL_TABLET | ORAL | 3 refills | Status: DC

## 2022-01-23 NOTE — Progress Notes (Signed)
Patient ID: Ruth [name], female   DOB: Sep 23, 1945, 76 y.o.   MRN: [id number]    Primary M.D.: Dr. Crist Infante  HPI: Ruth [name] is a 76 year old female who presents for a 7 month follow-up cardiology evaluation.  Ruth [name] has a history of coronary artery disease.  While living in [place] she developed chest discomfort and in 11-27-2004 underwent CABG surgery 4 at the [facility] in [place] by Dr. Marzetta Merino.  She had a LIMA to LAD, SVG to diagonal, SVG to obtuse marginal, and SVG to the RCA.   Her husband died in Nov 27, 2010 and she moved to the [place] area.  In 2011/11/28, she experienced symptoms of increasing shortness of breath.  She underwent cardiac catheterization and a stent was placed in the vein graft to her RCA.  She states that she has not had any subsequent stress testing.  She is a former patient of Dr. Einar Gip and an echocardiogram in July 2014 showed normal LV size and function with mild concentric left ventricular hypertrophy.  She had mild to moderate mitral regurgitation, mild tricuspid regurgitation, and mild primary hypertension with an estimated PA pressure 39 mm.  She has a history of hypertension for at least 15-20 years.  She states her blood pressure at home typically is in the 145-150 range, but her blood pressure is always elevated when she goes to the doctor's office.  She has a history of hyperlipidemia and apparently has been intolerant to statins but has never tried Zetia.  There is a history of diabetes mellitus but is not on therapy and states this is diet controlled.    She admits to  increased stress.  Her mother died in 2014-12-26 and recently her dog died.  She does not routinely exercise.  When I initially saw her in June 2016 she has been taking amlodipine 5 mg, losartan 50 mg twice a day and metoprolol 50 mrem twice a day for hypertension and CAD.  She has a history of multiple allergies.  She states that she is Intolerant to amlodipine as  well as Spironolactone.  She has been having labile blood pressure.  In the past. She became dehydrated on diarrhetic therapy.    An echo Doppler study on 03/13/2015  showed an ejection fraction at 55-60%.  There was mild mitral regurgitation, mild left atrial dilatation, and very mild pulmonary hypertension with an estimated PA pressure 32 mm.  A nuclear perfusion study done on 03/21/2015 was low risk and demonstrated a very small region of mild ischemia in the basal anterolateral wall.  She had normal LV function with an EF of 65%.  She developed recurrent episodes of chest pain in June 2018 which  led to her undergoing definitive cardiac catheterization on 03/01/2017.  She was found to have preserved global LV contractility with an EF of 55-60%.  There was significant multivessel CAD with 70% diffuse proximal LAD stenoses, total occlusion of the circumflex and OM1 proximally, and 75% proximal RCA stenosis with occlusion of the RCA prior to the acute margin.  She had a patent LIMA graft supplying the mid LAD, which may also anastomose into the diagonal vessel, but this was uncertain.  The vein graft supplying the distal marginal circumflex branch had smooth 60% proximal to mid body stenosis with TIMI-3 flow.  The vein graft supplying the distal RCA had focal 60% stenosis followed by 99% thrombotic stenosis beyond a stented segment.  She underwent successful PCI to this vein graft  to the RCA with insertion of a 3.532 mm Synergy stent postdilated to 3.71 mm with a Radiation protection practitioner used for distal graft protection.  It was recommended that she continue to antiplatelet therapy indefinitely.  Ruth [name] has significant allergies to multiple medications.  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  She has been on losartan 50 Milligan grams twice a day, metoprolol 100 mg twice a day, and continues to take  aspirin 81 mg and Effient 10 mg.  She did not tolerate Plavix or Brilinta.  She states that she is intolerant to statins.  She has no interest in PCSK9 inhibition.    Since I saw her in October 2018, she was seen in the office at least 5 times by extenders as well as pharmacists.  She is intolerant to numerous medications.  She last saw Nehemiah Massed, [facility] on December 02, 2017 who suggested the possibility of minoxidil but she continued to deny this medication change.  She states most recently her blood pressure at home has been ranging from 448-185 systolically and 63-14 diastolically.  She had called the office stating that she was having pain running through the area where she had a stent.  This was relieved with "water and vinegar which she drinks for gas.   When I saw her in May 2019, her blood pressure continued to be elevated in the office despite taking amlodipine 2.5 mg (she stated she could not tolerate 5 mg), metoprolol 100 mg twice a day, and she was taking valsartan 160 in the morning and only three quarters of a tablet in the evening.  With her blood pressure elevation I suggested further titration of valsartan to 160 twice daily and added Cardura 1 mg initially at night with plans to titrate to 2 mg.  I recommended follow-up hypertensive clinic evaluations with Joslyn Hy as well as Raquel Joellyn Haff who she has seen on numerous occasions.  He continues to admit to intolerance to numerous medications.  I saw her in February 2020 at which time she was feeling significantly better.   She continued to have blood pressure elevation in the office and told me typically blood pressure at home was ranging around 970 systolically.  Ruth [name] was admitted to the hospital with recurrent abdominal pain which is her anginal equivalent.  I recommended repeat definitive cardiac catheterization since prior to her last intervention she experienced the same symptoms which improved  following intervention to her vein graft to RCA.  Diagnostic catheterization was done on June 22, 2019 by Dr. Tamala Julian and she again had developed restenosis within the stent in the vein graft to her RCA.  She also had new stenosis of 90% in the vein graft supplying her circumflex marginal vessel.  Her LIMA to LAD and diagonal remain patent.  She had an occluded native mid RCA and circumflex and high-grade 70% proximal LAD with 90% mid LAD stenosis with an occluded second diagonal.  After much discussion we ultimately recommended PCI and the following day she underwent successful intervention with stenting to the vein graft supplying her circumflex marginal vessel and PTCA for the in-stent restenosis in the vein graft supplying her RCA.  Subsequently, her symptoms have improved.  Of note, during her hospitalization on presentation she had marked hyperlipidemia highly suggestive of familial hyperlipidemia with total cholesterol at 300, LDL cholesterol 224 and triglycerides 183.  In the past she has not  been able to tolerate statins or Zetia.  I recommended initiation of PCSK9 therapy.   She had called the office  with complaints of elevated blood pressure.  Amlodipine was increased to 7.5 mg daily.  She was to continue to take and record blood pressure readings daily.  She again called the office on August 18, 2019 with complaints of a sharp epigastric discomfort that she felt was gas but Gas-X only partially improved her symptoms.  She was w evaluated by Bunnie Domino, NP  in the office on August 21, 2019 for follow-up evaluation.  She was having some frequent burping and gas symptoms.  Her blood pressure was elevated at 158/92.  She had undergone prior abdominal ultrasound which was negative for cholelithiasis.  GI evaluation was recommended.   I saw her in a telemedicine visit in February 2021.  At that time she felt improved.  Since initiating Praluent, lipid studies had improved from an LDL of 224   To 123 on September 29, 2019.  I mentioned to her that she was only on the 75 mg dose of Praluent and my recommendation would be to titrate this to the 150 mg dose.  She also continued to have blood pressure lability and was adamant about increasing medications.  I saw her in a telemedicine visit in February 2021.  Her blood pressure continued to be elevated and I recommended she increase amlodipine to 5 mg.  Her LDL cholesterol was improved but remained elevated at 123 and I again recommended increasing Praluent to 150 mg rather than the reduced dose of 75 mg every 2 weeks.  I saw her in November 2021.  Over the prior year she had  issues with diverticulitis and irritable bowel syndrome.  She also has had urinary issues and underwent cystoscopy by Dr. Vikki Ports.  She was in need for Praluent and we had obtained a sample of her next dose.  She denies any recurrent chest pain.  Since I last saw her, she was recently notified by Sande Rives after the patient had called our answering service regarding blood pressure concerns.  She admitted that she gets very anxious and at times her blood pressure increases.  She was encouraged to keep a log of her blood pressure and bring it to her follow-up office visit.  I saw her on Jan 29, 2021.  At that time she told me her blood pressure typically runs around 140/70 but at night it can increase to as high as 220/105.  She states her blood pressure last night was 180/90.  She has numerous medication intolerances.  Currently she has been taking amlodipine 5 mg, hydralazine only on a as needed basis 10 mg, metoprolol tartrate 100 mg twice a day and valsartan 160 mg twice a day.  She was requesting another dose of Praluent to be provided.  I checked with our pharmacist and apparently she has been approved for continued therapy.  She continues to have a lot of anxiety.  She denies chest pain.  During that evaluation, with her blood pressure lability I recommended further  titration of amlodipine to 10 mg up from 5 mg and to take this in the morning and not wait 2 months to take a medication.  Apparently she has been staggering all her medications and was taking these medicines at 2-hour intervals throughout the day.  She continued the metoprolol 100 mg twice a day and had a prescription for hydralazine and continued on valsartan 160 twice a day.  I strongly recommended she increase her dose of Praluent from 75 mg up to the 150 mg dose but she did not want to do this.  Her most recent LDL was still elevated at 108.  She was given a sample of Praluent in the office.  I last saw her on July 30, 2021.  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  Apparently she has not taken Praluent over the last 2 months.  She admits to right shoulder upper back tenderness.  She never increased amlodipine to 10 mg and is taking 7.5 mg daily.  She continues to be on irbesartan 1 and 50 mg, Toprol tartrate 100 mg twice a day valsartan 160 twice a day and is not taking hydralazine.    Since I last saw her, she had laboratory recheck by Dr. Joylene Draft November 17, 2021.  Chemistry was normal.  Creatinine 0.7.  Hemoglobin/hematocrit 14.1/40.2.  Lipid studies showed total cholesterol 153, triglycerides 160, HDL 32, LDL 89.  Presently, she continues to have multiple drug intolerances.  She is no longer taking hydralazine daily but has this on a as needed basis.  She has been taking amlodipine 7.5 mg and a 5 mg and 2.5 regimen which she takes 2 hours apart.  She is no longer taking Lindzen's.  She continues to be on metoprolol tartrate 100 mg twice a day, valsartan 160 mg twice a day, prasugrel 10 mg daily, and is now back on low-dose Praluent at 75 mcg/mL.  She has had difficulty with diverticular disease.  She has had significant abdominal bloating.  She takes Gas-X regularly.  Denies any exertional chest  tightness.  She presents for reevaluation.  Past Medical History:  Diagnosis Date   Anxiety    Aortic atherosclerosis (HCC)    CAD (coronary artery disease)    a. CABG x 4 in 2006 in [place] (LIMA->LAD, VG->Diag, VG->OM, VG->RCA); b. DES to midbody of SVG-PDA/PLA 07/2012; c. 03/2015 Myoview: EF 65%, small defect of mild severit in basal inferolateral wall, low risk.   Complication of anesthesia    "quit breathing when I got ?[place]" (07/28/2012)   Coronary atherosclerosis    Depression    Diverticulitis    Diverticulosis    Dyslipidemia    Intolerant of statins   Fecal incontinence    GERD (gastroesophageal reflux disease)    Hyperlipidemia    Hyperplastic colon polyp    IBS (irritable bowel syndrome)    Labile hypertension    a. 09/2016 Renal Artery duplex: nl renal arteries.   Migraines    "had them in my 40's" (07/28/2012)   Multiple allergies    Obesity    Steatohepatitis    Tubular adenoma of colon    Type II diabetes mellitus ([place])    a. no meds, diet controlled - takes cinnamon.    Past Surgical History:  Procedure Laterality Date   ABDOMINAL HYSTERECTOMY  1970's   BREAST LUMPECTOMY     bilateral   CARDIAC CATHETERIZATION  2006   CORONARY ANGIOPLASTY WITH STENT PLACEMENT  07/28/2012   "1; first time for me" (07/28/2012)   CORONARY ARTERY BYPASS GRAFT  2006   CABG X4   CORONARY STENT INTERVENTION N/A 03/01/2017   Procedure: Coronary Stent Intervention;  Surgeon: Troy Sine, MD;  Location: [facility];  Service: Cardiovascular;  Laterality: N/A;   CORONARY STENT INTERVENTION N/A 06/23/2019   Procedure: CORONARY  STENT INTERVENTION;  Surgeon: Martinique, Peter M, MD;  Location: [facility];  Service: Cardiovascular;  Laterality: N/A;   EXCISIONAL HEMORRHOIDECTOMY  1970's   INGUINAL HERNIA REPAIR  1970's?   left   LEFT HEART CATH AND CORS/GRAFTS ANGIOGRAPHY N/A 03/01/2017   Procedure: Left Heart Cath and Cors/Grafts Angiography;  Surgeon: Troy Sine, MD;  Location: [facility];  Service: Cardiovascular;  Laterality: N/A;   LEFT HEART CATH AND CORS/GRAFTS ANGIOGRAPHY N/A 06/22/2019   Procedure: LEFT HEART CATH AND CORS/GRAFTS ANGIOGRAPHY;  Surgeon: Belva Crome, MD;  Location: [place] [facility];  Service: Cardiovascular;  Laterality: N/A;   LEFT HEART CATHETERIZATION WITH CORONARY/GRAFT ANGIOGRAM N/A 07/28/2012   Procedure: LEFT HEART CATHETERIZATION WITH Beatrix Fetters;  Surgeon: Burnell Blanks, MD;  Location: [facility];  Service: Cardiovascular;  Laterality: N/A;   PERCUTANEOUS CORONARY STENT INTERVENTION (PCI-S)  07/28/2012   Procedure: PERCUTANEOUS CORONARY STENT INTERVENTION (PCI-S);  Surgeon: Burnell Blanks, MD;  Location: [facility] C[facility] LAB;  Service: Cardiovascular;;   TONSILLECTOMY AND ADENOIDECTOMY  ~ 1951    Allergies  Allergen Reactions   Betadine [Povidone Iodine] Shortness Of Breath and Swelling   Codeine Anaphylaxis and Shortness Of Breath    "quit breathing" (07/28/2012) Tolerates 1-2 shots of morphine   Demerol Anaphylaxis    "quit breathing" (07/28/2012)   Iohexol Anaphylaxis    Finger/ankle swelling   Latex Anaphylaxis    "quit breathing" (07/28/2012)   Other Anaphylaxis    Perfume, Any Fragrance. Cleaning Fluids.  "quit breathing" (07/28/2012)   Percocet [Oxycodone-Acetaminophen] Anaphylaxis    "quit breathing; disoriented" (07/28/2012)   Plavix [Clopidogrel Bisulfate] Anaphylaxis    "get hot; like I'm burning up inside; had to put me in shower after OHS because of that" (07/28/2012)   Red Dye Anaphylaxis    "quit breathing" (07/28/2012)   Shellfish Allergy Shortness Of Breath    "broke out in knots all over" (07/28/2012)   Sulfonamide Derivatives Shortness Of Breath and Rash    "quit breathing" (07/28/2012)   Tylenol [Acetaminophen] Shortness Of Breath and Itching   Azilsartan     Other reaction(s): muscle pain   Azithromycin     Other reaction(s): she cannot take  it. felt like she would die. legs would not work , painful and had to get in the shower and had diarrhea.   Barbiturates     Other reaction(s): SOB   Cardizem [Diltiazem]     Other reaction(s): took one dose.  felt very sick, bad. said raised bp more.   Ciprofloxacin     Other reaction(s): after one does had charlie horses all over her legs and could not go back to sleep   Coumadin [Warfarin]     Other reaction(s): CAN'T TAKE, GETS HOT/BREAKS OUT   Crestor [Rosuvastatin]     Other reaction(s): RASH   Ezetimibe     Other reaction(s): RASH   Fluogen [Influenza Virus Vaccine]     Other reaction(s): gets very sick and thinks it is the mercury level.   Furosemide     Other reaction(s): makes her too tired.   Hydrochlorothiazide     Other reaction(s): broke out with blisters on her mouth   Omega-3-Acid Ethyl Esters     Other reaction(s): bad diarrhea   Pravastatin Sodium Other (See Comments)    Leg pain, problems ambulating    Spironolactone     Other reaction(s): states she doesn't want to take it as it made her dehydrated in  the past.   Statins     Muscle pain   Sulfa Antibiotics     Other reaction(s): swelling/SOB   Vancomycin     Other reaction(s): pt states it caused her major abd pain   Imdur [Isosorbide Nitrate] Other (See Comments)    Intolerance. "Can't remember the reaction."    Current Outpatient Medications  Medication Sig Dispense Refill   amLODipine (NORVASC) 5 MG tablet Take 5 mg twice a day 180 tablet 3   aspirin EC 81 MG tablet Take 81 mg by mouth daily.     BD PEN NEEDLE NANO 2ND GEN 32G X 4 MM MISC USE THREE TIMES DAILY AS DIRECTED WITH HUMALOG AND TOUJEO     calcium carbonate (TUMS - DOSED IN MG ELEMENTAL CALCIUM) 500 MG chewable tablet Chew 1-2 tablets by mouth 3 (three) times daily as needed for heartburn.      cholecalciferol (VITAMIN D) 1000 UNITS tablet Take 1,000 Units by mouth daily.     CRANBERRY-VITAMIN C PO Take 1 tablet by mouth daily.      EPINEPHrine 0.3 mg/0.3 mL IJ SOAJ injection Inject 0.3 mg into the muscle as needed.     HUMALOG KWIKPEN 100 UNIT/ML KwikPen Inject 5 Units into the skin in the morning and at bedtime.     hydrALAZINE (APRESOLINE) 10 MG tablet Take 10 mg three times a day if needed     Insulin Glargine (TOUJEO MAX SOLOSTAR [place]) Inject 50 Units into the skin daily.      Lancets (ONETOUCH DELICA PLUS [id number]) MISC USE TO CHECK BLOOD SUGAR FOUR TIMES DAILY     metoprolol tartrate (LOPRESSOR) 100 MG tablet TAKE 1 TABLET(100 MG) BY MOUTH TWICE DAILY (Patient taking differently: Take 100 mg by mouth 2 (two) times daily.) 180 tablet 1   nitrofurantoin (MACRODANTIN) 100 MG capsule Take 100 mg by mouth daily.     nitroGLYCERIN (NITROSTAT) 0.4 MG SL tablet Place 1 tablet (0.4 mg total) under the tongue every 5 (five) minutes as needed for chest pain (up to 3 doses). 25 tablet 2   NOVOLOG 100 UNIT/ML injection Inject into the skin.     ONETOUCH VERIO test strip      PRALUENT 75 MG/ML SOAJ ADMINISTER 1 ML UNDER THE SKIN EVERY 14 DAYS 2 mL 11   prasugrel (EFFIENT) 10 MG TABS tablet TAKE 1 TABLET(10 MG) BY MOUTH DAILY 90 tablet 3   Probiotic Product (PROBIOTIC-10 PO) Take 1 capsule by mouth daily.     valsartan (DIOVAN) 160 MG tablet TAKE 1 TABLET(160 MG) BY MOUTH TWICE DAILY (Patient taking differently: Take 160 mg by mouth 2 (two) times daily.) 180 tablet 3   vitamin B-12 (CYANOCOBALAMIN) 1000 MCG tablet Take 1,000 mcg by mouth daily.     No current facility-administered medications for this visit.    Social History   Socioeconomic History   Marital status: Widowed    Spouse name: Not on file   Number of children: 2   Years of education: Not on file   Highest education level: Not on file  Occupational History   Occupation: retired  Tobacco Use   Smoking status: Never   Smokeless tobacco: Never  Vaping Use   Vaping Use: Never used  Substance and Sexual Activity   Alcohol use: No    Alcohol/week: 0.0 standard  drinks   Drug use: No   Sexual activity: Never  Other Topics Concern   Not on file  Social History Narrative   Not on  file   Social Determinants of Health   Financial Resource Strain: Not on file  Food Insecurity: Not on file  Transportation Needs: Not on file  Physical Activity: Not on file  Stress: Not on file  Social Connections: Not on file  Intimate Partner Violence: Not on file   Social history is notable that she is widowed.  She has 2 children who are 5 years apart in their 26s there is no tobacco use.  She does not routinely exercise. She has a son who had undergone CABG surgery and reportedly has a defibrillator and an ejection fraction of 35%.  Family History  Problem Relation Age of Onset   Coronary artery disease Father    Colon cancer Father    Colon polyps Father    Heart disease Father    Stroke Mother    Hypertension Other    Breast cancer Other        grandmother   Diabetes Maternal Grandmother    Diabetes Paternal Grandmother    Esophageal cancer Neg Hx    Rectal cancer Neg Hx    Stomach cancer Neg Hx    Family history is notable in that her father has heart disease and is 79 years old.  Her mother died at age 57 with a stroke.  She has 2 sisters, ages 14 and 56.  ROS General: Negative; No fevers, chills, or night sweats HEENT: Negative; No changes in vision or hearing, sinus congestion, difficulty swallowing Pulmonary: Negative; No cough, wheezing, shortness of breath, hemoptysis Cardiovascular: See HPI:  GI: Positive for left upper quadrant pain which he describes as "gas."  She has issues with diverticulitis intermittently and irritable bowel syndrome GU: Has had some urinary issues and will be undergoing a cystoscopy Musculoskeletal: Negative; no myalgias, joint pain, or weakness Hematologic: Negative; no easy bruising, bleeding Endocrine: Positive for diabetes mellitus , which is untreated.  She has refused medications Neuro: Negative; no  changes in balance, headaches Skin: Negative; No rashes or skin lesions Psychiatric: Anxiety Sleep: Negative; No snoring,  daytime sleepiness, hypersomnolence, bruxism, restless legs, hypnogognic hallucinations. Other comprehensive 14 point system review is negative   Physical Exam BP 138/82   Pulse 61   Ht 5' 1.5" (1.562 m)   Wt 184 lb 9.6 oz (83.7 kg)   SpO2 97%   BMI 34.32 kg/m    Repeat blood pressure by me was 164/64.  She states her blood pressure at home may be 140/60 2 in the morning but during the day may significantly increase.  Wt Readings from Last 3 Encounters:  01/23/22 184 lb 9.6 oz (83.7 kg)  07/30/21 185 lb (83.9 kg)  07/23/21 190 lb 4 oz (86.3 kg)   General: Alert, oriented, no distress.  Skin: normal turgor, no rashes, warm and dry HEENT: Normocephalic, atraumatic. Pupils equal round and reactive to light; sclera anicteric; extraocular muscles intact;  Nose without nasal septal hypertrophy Mouth/Parynx benign; Mallinpatti scale 3 Neck: No JVD, no carotid bruits; normal carotid upstroke Lungs: clear to ausculatation and percussion; no wheezing or rales Chest wall: without tenderness to palpitation Heart: PMI not displaced, RRR, s1 s2 normal, 1/6 systolic murmur, no diastolic murmur, no rubs, gallops, thrills, or heaves Abdomen: Mildly distended soft, nontender; no hepatosplenomehaly, BS+; abdominal aorta nontender and not dilated by palpation. Back: no CVA tenderness Pulses 2+ Musculoskeletal: full range of motion, normal strength, no joint deformities Extremities: no clubbing cyanosis or edema, Homan's sign negative  Neurologic: grossly nonfocal; Cranial nerves grossly wnl  Psychologic: Normal mood and affect    Jan 23, 2022 ECG (independently read by me): NSR at 61, NSSTT changes; QTc 457 msec    July 30, 2021 ECG (independently read by me):  NSR at 64; NSST changes; QTc 464 msec  Jan 29, 2021 ECG (independently read by me): NSR at 65; nonspecific  ST changes  November 17, 2021ECG (independently read by me): Normal sinus rhythm at 63 bpm.  Normal intervals.  Mild T wave abnormality in aVL  October 2020 ECG (independently read by me): Normal sinus rhythm 64 bpm.  Nonspecific ST changes.   February 2020 ECG (independently read by me): NSR ay 68; nonspecific lateral T wave abnormality.  Normal intervals.  No ectopy.  May 2019 ECG (independently read by me): Normal sinus rhythm at 66 bpm.  Lateral T wave abnormality in leads I and aVL.  Normal intervals.  October 2018 ECG (independently read by me): Normal sinus rhythm at 66 bpm.  Nondiagnostic T change laterally.  Normal intervals.  No ectopy.  April 2018 ECG (independently read by me): Normal sinus rhythm at 67 bpm.  PR interval 142 Ruth.  Poor anterior R-wave progression.  Lateral T changes in 1 and L.  December 2017 ECG (independently read by me): Normal sinus rhythm at 70 bpm.  Nonspecific ST changes.  No ectopy.  March 2017 ECG (independently read by me):  Normal sinus rhythm at 70 bpm. Nonspecific ST changes laterally.  03/04/2015 ECG (independently read by me): Normal sinus rhythm at 65 bpm.  Mild T wave abnormality in lead 1 and aVL.  LABS:     Latest Ref Rng & Units 07/23/2021    3:06 PM 12/26/2020    1:43 AM 12/25/2020    3:47 AM  BMP  Glucose 70 - 99 mg/dL 234   157   178    BUN 6 - 23 mg/dL 15   20   14     Creatinine 0.40 - 1.20 mg/dL 0.72   0.81   0.83    Sodium 135 - 145 mEq/L 135   136   138    Potassium 3.5 - 5.1 mEq/L 3.7   3.8   4.6    Chloride 96 - 112 mEq/L 97   102   103    CO2 19 - 32 mEq/L 27   26   25     Calcium 8.4 - 10.5 mg/dL 9.7   9.4   9.3          Latest Ref Rng & Units 07/23/2021    3:06 PM 12/26/2020    1:43 AM 12/24/2020    4:59 AM  Hepatic Function  Total Protein 6.0 - 8.3 g/dL 7.4   6.0   6.8    Albumin 3.5 - 5.2 g/dL 4.4   3.3   3.7    AST 0 - 37 U/L 12   16   18     ALT 0 - 35 U/L 10   14   13     Alk Phosphatase 39 - 117 U/L 76   58    60    Total Bilirubin 0.2 - 1.2 mg/dL 0.6   0.8   0.8         Latest Ref Rng & Units 07/23/2021    3:06 PM 12/25/2020    3:47 AM 12/24/2020    3:12 AM  CBC  WBC 4.0 - 10.5 K/uL 8.2   8.2   6.2    Hemoglobin 12.0 - 15.0  g/dL 14.5   14.6   13.0    Hematocrit 36.0 - 46.0 % 42.8   44.2   39.9    Platelets 150.0 - 400.0 K/uL 289.0   265   201     Lab Results  Component Value Date   MCV 90.1 07/23/2021   MCV 91.5 12/25/2020   MCV 92.6 12/24/2020    Lab Results  Component Value Date   TSH 1.987 03/19/2015    BNP No results found for: BNP  ProBNP No results found for: PROBNP   Lipid Panel     Component Value Date/Time   CHOL 300 (H) 06/21/2019 1855   TRIG 183 (H) 06/21/2019 1855   HDL 39 (L) 06/21/2019 1855   CHOLHDL 7.7 06/21/2019 1855   VLDL 37 06/21/2019 1855   LDLCALC 224 (H) 06/21/2019 1855   LDLDIRECT 160.9 06/21/2012 0833     RADIOLOGY: No results found.  IMPRESSION:  1. Primary hypertension   2. Coronary artery disease involving native coronary artery of native heart without angina pectoris   3. Hyperlipidemia with target LDL less than 55   4. Diverticular disease   5. Cervical arthritis   6. Statin intolerance   7. Medication management     ASSESSMENT AND PLAN: Ruth. Keiva Dina is a 76 year old female who is 17 years status post CABG revascularization surgery 4  at the [facility] in [place], [place] in 2006.  She is status post remote DES stenting to the vein graft supplying her RCA territory.  She developed recurrent anginal symptomatology leading to repeat cardiac catheterization on 03/01/2017. 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  She has had issues with blood pressure lability.  She has not had any recurrent anginal symptomatology.She has  cervical arthritis involving her neck.  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  There also was active inflammation on the right at C2-3 and C3-4 there was concern for possible left radiculopathy.  When I saw her at aprior evaluation I recommended she change the way she took her medications and apparently at that time she was taking medications every 2 hours throughout the day.  She has always been hesitant to make certain changes and has many drug allergies.  He has intolerances to numerous medications.  Most recently she has been taking amlodipine 5 mg and 2 hours later an additional 2.5 mg which she has tolerated.  She continues to be on metoprolol tartrate 100 mg twice a day, valsartan 160 mg twice a day.  Blood pressure continues to be elevated.  I have suggested changing amlodipine to 10 mg but she would prefer taking a 5 twice a day regimen rather than 10 mg daily.  Reviewed her most recent lipid studies.  All these are significantly improved since she has been on low-dose Praluent, I did recommend she titrate this to 150 mg regimen.  However she prefers not to do this.  I discussed ideal target LDL for her is less than and 55 if at all possible and her most recent LDL is 89.  In the past she did not tolerate statins and did not tolerate Zetia.  Is no longer taking her hydralazine and has this available when her blood pressure reaches 200.  She  continues to have some bloating and increased gas for which she takes "Gas-X" with benefit.  She has follow-up GI evaluation.  She denies any exertional chest tightness or pressure.  She is not very active.  I  will see her in 6 months for reevaluation or sooner as needed.  Troy Sine, MD, [facility]  01/23/2022 1:11 PM

## 2022-01-23 NOTE — Patient Instructions (Signed)
Medication Instructions:  Take Amlodipine 5 mg twice a day Continue all other medications *If you need a refill on your cardiac medications before your next appointment, please call your pharmacy*   Lab Work: None ordered   Testing/Procedures: None ordered   Follow-Up: At [facility], you and your health needs are our priority.  As part of our continuing mission to provide you with exceptional heart care, we have created designated Provider Care Teams.  These Care Teams include your primary Cardiologist (physician) and Advanced Practice Providers (APPs -  Physician Assistants and Nurse Practitioners) who all work together to provide you with the care you need, when you need it.  We recommend signing up for the patient portal called "MyChart".  Sign up information is provided on this After Visit Summary.  MyChart is used to connect with patients for Virtual Visits (Telemedicine).  Patients are able to view lab/test results, encounter notes, upcoming appointments, etc.  Non-urgent messages can be sent to your provider as well.   To learn more about what you can do with MyChart, go to [email or web address].    Your next appointment:  6 months    The format for your next appointment:Office    Provider:  Dr.[name]   Important Information About Sugar

## 2022-01-27 ENCOUNTER — Telehealth: Payer: Self-pay | Admitting: Cardiovascular Disease

## 2022-01-27 NOTE — Telephone Encounter (Signed)
Patient called and stated that she forgot if she took her blood thinner asperin this morning! Says that her legs hurt an wants to take another asperin to ease pain

## 2022-01-27 NOTE — Telephone Encounter (Signed)
Spoke with pt she states that she is unsure if she has taken her medications, she states that she woke up with charlie horses so she does not remember if she took her medication. She states that due to her leg pain, she will take ASA 70mx2 (she is allergic to TYL) and thinks that this will help.

## 2022-01-29 NOTE — Telephone Encounter (Signed)
Returned call, patient reports doing fine, no concerns

## 2022-02-17 ENCOUNTER — Ambulatory Visit: Payer: Medicare Other | Admitting: Podiatry

## 2022-02-19 ENCOUNTER — Other Ambulatory Visit (INDEPENDENT_AMBULATORY_CARE_PROVIDER_SITE_OTHER): Payer: Medicare Other

## 2022-02-19 ENCOUNTER — Ambulatory Visit: Payer: Medicare Other | Admitting: Nurse Practitioner

## 2022-02-19 ENCOUNTER — Encounter: Payer: Self-pay | Admitting: Nurse Practitioner

## 2022-02-19 VITALS — BP 152/70 | HR 68 | Ht 61.5 in | Wt 185.2 lb

## 2022-02-19 DIAGNOSIS — G8929 Other chronic pain: Secondary | ICD-10-CM

## 2022-02-19 DIAGNOSIS — R151 Fecal smearing: Secondary | ICD-10-CM | POA: Diagnosis not present

## 2022-02-19 DIAGNOSIS — R1030 Lower abdominal pain, unspecified: Secondary | ICD-10-CM

## 2022-02-19 DIAGNOSIS — R1032 Left lower quadrant pain: Secondary | ICD-10-CM

## 2022-02-19 LAB — URINALYSIS, ROUTINE W REFLEX MICROSCOPIC
Bilirubin Urine: NEGATIVE
Hgb urine dipstick: NEGATIVE
Ketones, ur: NEGATIVE
Nitrite: NEGATIVE
Specific Gravity, Urine: 1.01 (ref 1.000–1.030)
Total Protein, Urine: NEGATIVE
Urine Glucose: NEGATIVE
Urobilinogen, UA: 0.2 (ref 0.0–1.0)
pH: 6 (ref 5.0–8.0)

## 2022-02-19 LAB — CBC WITH DIFFERENTIAL/PLATELET
Basophils Absolute: 0.1 10*3/uL (ref 0.0–0.1)
Basophils Relative: 0.6 % (ref 0.0–3.0)
Eosinophils Absolute: 0.1 10*3/uL (ref 0.0–0.7)
Eosinophils Relative: 0.7 % (ref 0.0–5.0)
HCT: 40.2 % (ref 36.0–46.0)
Hemoglobin: 13.6 g/dL (ref 12.0–15.0)
Lymphocytes Relative: 9.5 % — ABNORMAL LOW (ref 12.0–46.0)
Lymphs Abs: 1.2 10*3/uL (ref 0.7–4.0)
MCHC: 33.7 g/dL (ref 30.0–36.0)
MCV: 89.3 fl (ref 78.0–100.0)
Monocytes Absolute: 1.2 10*3/uL — ABNORMAL HIGH (ref 0.1–1.0)
Monocytes Relative: 9.2 % (ref 3.0–12.0)
Neutro Abs: 10.5 10*3/uL — ABNORMAL HIGH (ref 1.4–7.7)
Neutrophils Relative %: 80 % — ABNORMAL HIGH (ref 43.0–77.0)
Platelets: 268 10*3/uL (ref 150.0–400.0)
RBC: 4.5 Mil/uL (ref 3.87–5.11)
RDW: 13.3 % (ref 11.5–15.5)
WBC: 13.1 10*3/uL — ABNORMAL HIGH (ref 4.0–10.5)

## 2022-02-19 NOTE — Patient Instructions (Addendum)
Miralax twice daily today and tomorrow and then once daily Glycerin suppositories. Use once today and then daily as needed.  Call us tomorrow with a condition update. If you are age 78 or older, your body mass index should be between 23-30. Your Body mass index is 34.44 kg/m. If this is out of the aforementioned range listed, please consider follow up with your Primary Care Provider. Your provider has requested that you go to the basement level for lab work before leaving today. Press "B" on the elevator. The lab is located at the first door on the left as you exit the elevator.   If you are age 46 or younger, your body mass index should be between 19-25. Your Body mass index is 34.44 kg/m. If this is out of the aformentioned range listed, please consider follow up with your Primary Care Provider.   ________________________________________________________  The [facility] providers would like to encourage you to use [facility] to communicate with providers for non-urgent requests or questions.  Due to long hold times on the telephone, sending your provider a message by [facility] may be a faster and more efficient way to get a response.  Please allow 48 business hours for a response.  Please remember that this is for non-urgent requests.   Due to recent changes in healthcare laws, you may see the results of your imaging and laboratory studies on MyChart before your provider has had a chance to review them.  We understand that in some cases there may be results that are confusing or concerning to you. Not all laboratory results come back in the same time frame and the provider may be waiting for multiple results in order to interpret others.  Please give [place] 48 hours in order for your provider to thoroughly review all the results before contacting the office for clarification of your results.    I appreciate the  opportunity to care for you  Thank You   Ruth [name] _______________________________________________________

## 2022-02-19 NOTE — Progress Notes (Signed)
Assessment   Patient Profile:  Ruth [name] is a 76 y.o. female known to Dr. Fuller Plan with a past medical history of IBS, chronic abdominal pain, hepatic steatosis, DM, adenomatous colon polyps, diverticulosis /diverticulitis, GERD, CAD  s/p CABG in 2006 / cardiac stenting  in 2013 and 2018,  hypertension, anxiety, depression.   Additional medical history as listed in [place] .  IBS with chronic abdominal pain, chronic bloating, chronic constipation alternating with loose stools with occasional fecal leakage. No meaningful bowel movement in three days and now with worsening fecal leakage. Could be overflow diarrhea. Also decreased sphincter tone contributes to the leakage.    History of adenomatous colon polyps (small tubular adenoma in Jan 2015). No plan for future surveillance colonoscopies due to comorbidities and age.   Multiple drug allergies/sensitivities  Plan   She says she is not able to tolerate a bowel prep to purge bowels so try Miralax twice daily today and tomorrow and then once daily.  Glycerin suppositories today and then daily as needed.  Obtain urinalysis CBC Call us tomorrow with a condition update on abdominal pain and constipation.    HPI   Chief Complaint : mid lower abdominal pain, fecal leakage, no BM in three days, bloated  Jovita was last seen by Carl Best, PA November 2022 for evaluation of abdominal pain.  She has history of chronic abdominal pain but also has a history of left-sided diverticulitis.  She underwent a noncontrast CT scan which showed severe colonic diverticulosis but no diverticulitis or other acute abnormalities.  She was advised to take MiraLAX as needed at bedtime for constipation.    Interval history:  She hasn't had a good BM in 3 days. She never did start the Miralax as recommended by [facility]. She did take prune juice a few days ago but only having fecal seepage. The fecal leakage is not a new issue for her but has gotten  worse. She always has to use digital manipulation to have a BMs. She is having pain in her lower mid abdomen. She says this pain is new and is stabbing and also pressure like. No dysuria. She has a history of UTIs and is concerned about that as a source of the pain.  No fevers. She wonders if the pain may be due to constipation but also inquires about antibiotics for possible diverticulitis.    Imaging   Nov 2022 Non contrast CTAP IMPRESSION: 1. No acute abnormality. No evidence of bowel obstruction or acute bowel inflammation. Marked diffuse colonic diverticulosis, with no evidence of acute diverticulitis. 2. Coronary atherosclerosis. 3. Aortic Atherosclerosis (ICD10-I70.0).  Labs:     Latest Ref Rng & Units 07/23/2021    3:06 PM 12/25/2020    3:47 AM 12/24/2020    3:12 AM  CBC  WBC 4.0 - 10.5 K/uL 8.2  8.2  6.2   Hemoglobin 12.0 - 15.0 g/dL 14.5  14.6  13.0   Hematocrit 36.0 - 46.0 % 42.8  44.2  39.9   Platelets 150.0 - 400.0 K/uL 289.0  265  201        Latest Ref Rng & Units 07/23/2021    3:06 PM 12/26/2020    1:43 AM 12/24/2020    4:59 AM  Hepatic Function  Total Protein 6.0 - 8.3 g/dL 7.4  6.0  6.8   Albumin 3.5 - 5.2 g/dL 4.4  3.3  3.7   AST 0 - 37 U/L 12  16  18    ALT  0 - 35 U/L 10  14  13    Alk Phosphatase 39 - 117 U/L 76  58  60   Total Bilirubin 0.2 - 1.2 mg/dL 0.6  0.8  0.8      Past Medical History:  Diagnosis Date   Anxiety    Aortic atherosclerosis (HCC)    CAD (coronary artery disease)    a. CABG x 4 in 2006 in [place] (LIMA->LAD, VG->Diag, VG->OM, VG->RCA); b. DES to midbody of SVG-PDA/PLA 07/2012; c. 03/2015 Myoview: EF 65%, small defect of mild severit in basal inferolateral wall, low risk.   Complication of anesthesia    "quit breathing when I got ?[place]" (07/28/2012)   Coronary atherosclerosis    Depression    Diverticulitis    Diverticulosis    Dyslipidemia    Intolerant of statins   Fecal incontinence    GERD (gastroesophageal reflux  disease)    Hyperlipidemia    Hyperplastic colon polyp    IBS (irritable bowel syndrome)    Labile hypertension    a. 09/2016 Renal Artery duplex: nl renal arteries.   Migraines    "had them in my 8's" (07/28/2012)   Multiple allergies    Obesity    Steatohepatitis    Tubular adenoma of colon    Type II diabetes mellitus ([place])    a. no meds, diet controlled - takes cinnamon.    Past Surgical History:  Procedure Laterality Date   ABDOMINAL HYSTERECTOMY  1970's   BREAST LUMPECTOMY     bilateral   CARDIAC CATHETERIZATION  2006   CORONARY ANGIOPLASTY WITH STENT PLACEMENT  07/28/2012   "1; first time for me" (07/28/2012)   CORONARY ARTERY BYPASS GRAFT  2006   CABG X4   CORONARY STENT INTERVENTION N/A 03/01/2017   Procedure: Coronary Stent Intervention;  Surgeon: Troy Sine, MD;  Location: [facility];  Service: Cardiovascular;  Laterality: N/A;   CORONARY STENT INTERVENTION N/A 06/23/2019   Procedure: CORONARY STENT INTERVENTION;  Surgeon: Martinique, Peter M, MD;  Location: [facility];  Service: Cardiovascular;  Laterality: N/A;   EXCISIONAL HEMORRHOIDECTOMY  1970's   INGUINAL HERNIA REPAIR  1970's?   left   LEFT HEART CATH AND CORS/GRAFTS ANGIOGRAPHY N/A 03/01/2017   Procedure: Left Heart Cath and Cors/Grafts Angiography;  Surgeon: Troy Sine, MD;  Location: [facility];  Service: Cardiovascular;  Laterality: N/A;   LEFT HEART CATH AND CORS/GRAFTS ANGIOGRAPHY N/A 06/22/2019   Procedure: LEFT HEART CATH AND CORS/GRAFTS ANGIOGRAPHY;  Surgeon: Belva Crome, MD;  Location: [facility];  Service: Cardiovascular;  Laterality: N/A;   LEFT HEART CATHETERIZATION WITH CORONARY/GRAFT ANGIOGRAM N/A 07/28/2012   Procedure: LEFT HEART CATHETERIZATION WITH Beatrix Fetters;  Surgeon: Burnell Blanks, MD;  Location: [facility] CATH LAB;  Service: Cardiovascular;  Laterality: N/A;   PERCUTANEOUS CORONARY STENT INTERVENTION (PCI-S)  07/28/2012   Procedure:  PERCUTANEOUS CORONARY STENT INTERVENTION (PCI-S);  Surgeon: Burnell Blanks, MD;  Location: [facility] CATH LAB;  Service: Cardiovascular;;   TONSILLECTOMY AND ADENOIDECTOMY  ~ 1951    Current Medications, Allergies, Family History and Social History were reviewed in [employer] record.     Current Outpatient Medications  Medication Sig Dispense Refill   amLODipine (NORVASC) 5 MG tablet Take 5 mg twice a day 180 tablet 3   aspirin EC 81 MG tablet Take 81 mg by mouth daily.     BD PEN NEEDLE NANO 2ND GEN 32G X 4 MM MISC  USE THREE TIMES DAILY AS DIRECTED WITH HUMALOG AND TOUJEO     calcium carbonate (TUMS - DOSED IN MG ELEMENTAL CALCIUM) 500 MG chewable tablet Chew 1-2 tablets by mouth 3 (three) times daily as needed for heartburn.      cholecalciferol (VITAMIN D) 1000 UNITS tablet Take 1,000 Units by mouth daily.     CRANBERRY-VITAMIN C PO Take 1 tablet by mouth daily.     HUMALOG KWIKPEN 100 UNIT/ML KwikPen Inject 5 Units into the skin in the morning and at bedtime.     Insulin Glargine (TOUJEO MAX SOLOSTAR [place]) Inject 50 Units into the skin daily.      Lancets (ONETOUCH DELICA PLUS [id number]) MISC USE TO CHECK BLOOD SUGAR FOUR TIMES DAILY     metoprolol tartrate (LOPRESSOR) 100 MG tablet TAKE 1 TABLET(100 MG) BY MOUTH TWICE DAILY (Patient taking differently: Take 100 mg by mouth 2 (two) times daily.) 180 tablet 1   nitrofurantoin (MACRODANTIN) 100 MG capsule Take 100 mg by mouth daily.     ONETOUCH VERIO test strip      PRALUENT 75 MG/ML SOAJ ADMINISTER 1 ML UNDER THE SKIN EVERY 14 DAYS 2 mL 11   prasugrel (EFFIENT) 10 MG TABS tablet TAKE 1 TABLET(10 MG) BY MOUTH DAILY 90 tablet 3   Probiotic Product (PROBIOTIC-10 PO) Take 1 capsule by mouth daily.     valsartan (DIOVAN) 160 MG tablet TAKE 1 TABLET(160 MG) BY MOUTH TWICE DAILY (Patient taking differently: Take 160 mg by mouth 2 (two) times daily.) 180 tablet 3   vitamin B-12 (CYANOCOBALAMIN) 1000 MCG tablet Take 1,000  mcg by mouth daily.     EPINEPHrine 0.3 mg/0.3 mL IJ SOAJ injection Inject 0.3 mg into the muscle as needed. (Patient not taking: Reported on 02/19/2022)     nitroGLYCERIN (NITROSTAT) 0.4 MG SL tablet Place 1 tablet (0.4 mg total) under the tongue every 5 (five) minutes as needed for chest pain (up to 3 doses). 25 tablet 2   No current facility-administered medications for this visit.    Review of Systems: No chest pain. No shortness of breath. No urinary complaints.    Physical Exam  Wt Readings from Last 3 Encounters:  02/19/22 185 lb 4 oz (84 kg)  01/23/22 184 lb 9.6 oz (83.7 kg)  07/30/21 185 lb (83.9 kg)    BP (!) 152/70 (BP Location: Left Arm, Patient Position: Sitting, Cuff Size: Normal)   Pulse 68 Comment: irregular  Ht 5' 1.5" (1.562 m) Comment: height measured without shoes  Wt 185 lb 4 oz (84 kg)   BMI 34.44 kg/m  Constitutional:  Generally well appearing female in no acute distress. Psychiatric: Pleasant. Normal mood and affect. Behavior is normal. EENT: Pupils normal.  Conjunctivae are normal. No scleral icterus. Neck supple.  Cardiovascular: Normal rate, regular rhythm. No edema Pulmonary/chest: Effort normal and breath sounds normal. No wheezing, rales or rhonchi. Abdominal: Soft, nondistended, nontender. Bowel sounds active throughout. There are no masses palpable. No hepatomegaly. Rectal : External hemorrhoids / hemorrhoid tags. No stool in vault on DRE. Poor sphincter tone Neurological: Alert and oriented to person place and time. Skin: Skin is warm and dry. No rashes noted.  Tye Savoy, NP  02/19/2022, 9:27 AM

## 2022-02-20 ENCOUNTER — Telehealth: Payer: Self-pay | Admitting: Nurse Practitioner

## 2022-02-23 NOTE — Telephone Encounter (Signed)
Left message for pt to call back. See 6/15 lab result notes.

## 2022-02-24 ENCOUNTER — Ambulatory Visit: Payer: Medicare Other | Admitting: Podiatry

## 2022-03-16 ENCOUNTER — Telehealth: Payer: Self-pay | Admitting: Cardiovascular Disease

## 2022-03-16 ENCOUNTER — Telehealth: Payer: Self-pay | Admitting: Gastroenterology

## 2022-03-16 NOTE — Telephone Encounter (Signed)
Patient has been to the ER three times in the last several weeks.  They found blockages in her stomach and she has had rectal bleeding.  The ER told her she needed to see Dr. Fuller Plan ASAP.  She does not want to see an APP.  Please call patient and advise.

## 2022-03-16 NOTE — Telephone Encounter (Signed)
Patient stated she was having chest pain, but didn't think it was from her heart. Two days ago, her BP was 214/107 and "my eye turned black". I asked her to take a NTG and she refused because it causes headache. When I recommended that she go to the ED, she raised her voice at me and stated she was at the ED for rectal bleeding and gastric blockages. She stated nobody cares about her and she hung up. I called her back to check on her. She apologized to me and then explained that she has diverticulitis and had been in the hospital for rectal bleeding. She called to get an appointment with GI but was told there were no appointments until end of 05/29/23. She said "I'll be dead by then." She said she would call her pcp to see if they could help her get a sooner appointment with GI. She said she is bloated and the pain she feels in her chest is light and she believes it it from her GI  problems. When she belches, she gets relief. I recommended that if her chest pain worsens, she needs to go to the ED or an urgent care. Again, she refused to take ntg while on phone with me. Her BP today is 138/52.

## 2022-03-16 NOTE — Telephone Encounter (Signed)
Pt went to ED on 6/23 for rectal bleeding and ED notes stated that GI was consulted and recommended patient to get colonoscopy with her primary GI in 6 weeks. Pt states she is still having abd pain and needed an appt with Dr. Fuller Plan as soon as possible. Pt scheduled for appt on 03/17/22 at 3:40 pm with Dr. Fuller Plan. Pt verbalized understanding and had no other concerns at end of call.

## 2022-03-16 NOTE — Telephone Encounter (Signed)
  Pt c/o of Chest Pain: STAT if CP now or developed within 24 hours  1. Are you having CP right now? A little pain  2. Are you experiencing any other symptoms (ex. SOB, nausea, vomiting, sweating)?   3. How long have you been experiencing CP? This week  4. Is your CP continuous or coming and going? Coming and going   5. Have you taken Nitroglycerin? No   Pt said, she's been having a lot of gas and having chest discomfort. She is waiting for her GI doctor to call her back and for the mean time she wanted to check in with Dr. Claiborne Billings since she is having chest discomfort  ?

## 2022-03-17 ENCOUNTER — Ambulatory Visit: Payer: Medicare Other | Admitting: Gastroenterology

## 2022-03-17 ENCOUNTER — Encounter: Payer: Self-pay | Admitting: Gastroenterology

## 2022-03-17 VITALS — BP 134/62 | HR 71 | Ht 61.5 in | Wt 179.0 lb

## 2022-03-17 DIAGNOSIS — R933 Abnormal findings on diagnostic imaging of other parts of digestive tract: Secondary | ICD-10-CM | POA: Diagnosis not present

## 2022-03-17 DIAGNOSIS — K551 Chronic vascular disorders of intestine: Secondary | ICD-10-CM | POA: Diagnosis not present

## 2022-03-17 DIAGNOSIS — R140 Abdominal distension (gaseous): Secondary | ICD-10-CM | POA: Diagnosis not present

## 2022-03-17 DIAGNOSIS — K921 Melena: Secondary | ICD-10-CM

## 2022-03-17 MED ORDER — DICYCLOMINE HCL 20 MG PO TABS: 10.0000 mg | ORAL_TABLET | Freq: Three times a day (TID) | ORAL | 11 refills | Status: DC

## 2022-03-17 NOTE — Progress Notes (Signed)
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 R/O ischemic colitis, hemorrhoids, colorectal neoplasms.  Chronic constipation Personal history of 1 small adenomatous colon polyp in 2015.  Family history of colon cancer, father. 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 Claiborne Billings Begin dicyclomine 10 mg p.o. 3 times daily 30 to 60 minutes before meals prn Gas-X 4 times daily as needed She declines use of a mild laxative - concerned medication reactions She is at a high risk for complications from colonoscopy and anesthesia.  She states she does not want a colonoscopy for these reasons and she has a very difficult time tolerating the prep with nausea, vomiting.  No plans for colonoscopy at this time.   HPI    This is a 76 year old female who was evaluated at [facility] ED and hospitalized there from June 19-21, 2023.  She was initially evaluated in the ED with small-volume hematochezia and suprapubic pain. CT AP showed diverticulitis and she was treated with amoxicillin/clavulanate. She returned to the ED with persistent pain and hematochezia was hospitalized. She was treated with piperacillin/tazobactam which was converted to amoxicillin/clavulanate at discharge.  Her suprapubic pain has completely resolved.  She has ongoing problems with postprandial abdominal bloating, gas, belching and constipation consistent with her chronic complaints.  She notes bright red blood per  rectum has improved since resolution of diverticulitis vs ischemic colitis.  She states it is difficult for her to evacuate feces and often has to perform a digital manipulation to allow a bowel movement.  Colonoscopy was recommended at [facility] ED after resolution of diverticulitis.   Colonoscopy Jan 2015 Dr. Olevia Perches: One small sessile polyp in the cecum - tubular adenoma Moderate diverticulosis throughout Decreased anal sphincter tone Macerated mucosa in the anal canal Random biopsies of normal appearing mucosa -unremarkable colonic mucosa   Labs / Imaging    CTA AP 02/28/2022 1. Extensive atherosclerotic calcification throughout the visualized  arterial vasculature. No active gastrointestinal hemorrhage  identified.  2. Greater than 75% stenosis of the origin of the superior  mesenteric artery. High-grade stenosis of the inferior mesenteric  artery at its origin. Together, the findings would support the  diagnosis of chronic mesenteric ischemia in the appropriate clinical  setting.  3. 50% stenosis of the left renal artery at its origin.  4. Lower extremity arterial inflow disease. 50-75% stenosis of the  right common iliac artery and 50% stenosis of the left common iliac  artery at their origin.  5. Lower extremity arterial outflow disease. 50% stenosis of the  right common femoral artery. Superimposed high-grade stenoses of the  visualized proximal superficial femoral arteries bilaterally.  6. Extensive right coronary artery calcification.  7. Interval improvement in subacute sigmoid diverticulitis. No  evidence of obstruction or perforation.  8. Mild hepatic steatosis.    CT AP 02/21/2022 1. Acute sigmoid diverticulitis with moderate circumferential wall  thickening. Colonoscopy follow-up recommended after treatment to  exclude underlying lesion.  2. Mild nonlocalizing reactive free fluid in the posterior pelvis  but no free air, abscess or  free hemorrhage.  3. Heavy  aortic and branch vessel atherosclerosis. Coronary  atherosclerosis. Old CABG.  4. Fatty liver.  5. Hiatal hernia, umbilical fat hernia, nonobstructive  transmesenteric internal hernia ascending mesocolon, and  interposition of the hepatic flexure in front of the liver.  6. Pelvic floor laxity and a small cystocele.  7. Left ovary asymmetrically prominent today but also abuts the  inflammatory process. This is most likely reactive edema.       Latest Ref Rng & Units 07/23/2021    3:06 PM 12/26/2020    1:43 AM 12/24/2020    4:59 AM  Hepatic Function  Total Protein 6.0 - 8.3 g/dL 7.4  6.0  6.8   Albumin 3.5 - 5.2 g/dL 4.4  3.3  3.7   AST 0 - 37 U/L _0 ALT 0 - 35 U/L _1 Alk Phosphatase 39 - 117 U/L 76  58  60   Total Bilirubin 0.2 - 1.2 mg/dL 0.6  0.8  0.8        Latest Ref Rng & Units 02/19/2022   10:05 AM 07/23/2021    3:06 PM 12/25/2020    3:47 AM  CBC  WBC 4.0 - 10.5 K/uL 13.1  8.2  8.2   Hemoglobin 12.0 - 15.0 g/dL 13.6  14.5  14.6   Hematocrit 36.0 - 46.0 % 40.2  42.8  44.2   Platelets 150.0 - 400.0 K/uL 268.0  289.0  265     Current Medications, Allergies, Past Medical History, Past Surgical History, Family History and Social History were reviewed in [employer] record.   Physical Exam: General: Well developed, well nourished, no acute distress Head: Normocephalic and atraumatic Eyes: Sclerae anicteric, EOMI Ears: Normal auditory acuity Mouth: Not examined Lungs: Clear throughout to auscultation Heart: Regular rate and rhythm; no murmurs, rubs or bruits Abdomen: Soft, non tender and non distended. No masses, hepatosplenomegaly or hernias noted. Normal Bowel sounds Rectal: Not done Musculoskeletal: Symmetrical with no gross deformities  Pulses:  Normal pulses noted Extremities: No clubbing, cyanosis, edema or deformities noted Neurological: Alert oriented x 4, grossly nonfocal Psychological:  Alert and cooperative. Normal  mood and affect   Ruth [name] T. Fuller Plan, MD 03/17/2022, 3:39 PM

## 2022-03-17 NOTE — Patient Instructions (Signed)
Call the cardiology office and make a sooner appointment.   Keep your appointment with the vascular surgeon.   You can take over the counter Gas-x four times a day for gas and bloating.  We have sent the following medications to your pharmacy for you to pick up at your convenience: dicyclomine 20 mg taking 1/2 tablet by mouth three times a day.  The [facility] providers would like to encourage you to use [facility] to communicate with providers for non-urgent requests or questions.  Due to long hold times on the telephone, sending your provider a message by [facility] may be a faster and more efficient way to get a response.  Please allow 48 business hours for a response.  Please remember that this is for non-urgent requests.   Thank you for choosing me and [facility].  Pricilla Riffle. Dagoberto Ligas., MD., Marval Regal

## 2022-03-18 ENCOUNTER — Telehealth: Payer: Self-pay | Admitting: Cardiovascular Disease
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
## 2022-03-18 NOTE — Progress Notes (Signed)
Cardiology Clinic Note   Patient Name: Ruth [name] Date of Encounter: 03/20/2022  Primary Care Provider:  Crist Infante, MD Primary Cardiologist:  [name], MD  Patient Profile    Ruth [name] 76 year old female presents the clinic today for follow-up evaluation of her hypertension and coronary artery disease.  Past Medical History    Past Medical History:  Diagnosis Date   Anxiety    Aortic atherosclerosis (HCC)    CAD (coronary artery disease)    a. CABG x 4 in 2006 in [place] (LIMA->LAD, VG->Diag, VG->OM, VG->RCA); b. DES to midbody of SVG-PDA/PLA 07/2012; c. 03/2015 Myoview: EF 65%, small defect of mild severit in basal inferolateral wall, low risk.   Complication of anesthesia    "quit breathing when I got ?[place]" (07/28/2012)   Coronary atherosclerosis    Depression    Diverticulitis    Diverticulosis    Dyslipidemia    Intolerant of statins   Fecal incontinence    GERD (gastroesophageal reflux disease)    Hyperlipidemia    Hyperplastic colon polyp    IBS (irritable bowel syndrome)    Labile hypertension    a. 09/2016 Renal Artery duplex: nl renal arteries.   Migraines    "had them in my 48's" (07/28/2012)   Multiple allergies    Obesity    Steatohepatitis    Tubular adenoma of colon    Type II diabetes mellitus ([place])    a. no meds, diet controlled - takes cinnamon.   Past Surgical History:  Procedure Laterality Date   ABDOMINAL HYSTERECTOMY  1970's   BREAST LUMPECTOMY     bilateral   CARDIAC CATHETERIZATION  2006   CORONARY ANGIOPLASTY WITH STENT PLACEMENT  07/28/2012   "1; first time for me" (07/28/2012)   CORONARY ARTERY BYPASS GRAFT  2006   CABG X4   CORONARY STENT INTERVENTION N/A 03/01/2017   Procedure: Coronary Stent Intervention;  Surgeon: Troy Sine, MD;  Location: [facility];  Service: Cardiovascular;  Laterality: N/A;   CORONARY STENT INTERVENTION N/A 06/23/2019   Procedure: CORONARY STENT INTERVENTION;   Surgeon: Martinique, Peter M, MD;  Location: [facility];  Service: Cardiovascular;  Laterality: N/A;   EXCISIONAL HEMORRHOIDECTOMY  1970's   INGUINAL HERNIA REPAIR  1970's?   left   LEFT HEART CATH AND CORS/GRAFTS ANGIOGRAPHY N/A 03/01/2017   Procedure: Left Heart Cath and Cors/Grafts Angiography;  Surgeon: Troy Sine, MD;  Location: [facility];  Service: Cardiovascular;  Laterality: N/A;   LEFT HEART CATH AND CORS/GRAFTS ANGIOGRAPHY N/A 06/22/2019   Procedure: LEFT HEART CATH AND CORS/GRAFTS ANGIOGRAPHY;  Surgeon: Belva Crome, MD;  Location: [facility];  Service: Cardiovascular;  Laterality: N/A;   LEFT HEART CATHETERIZATION WITH CORONARY/GRAFT ANGIOGRAM N/A 07/28/2012   Procedure: LEFT HEART CATHETERIZATION WITH Beatrix Fetters;  Surgeon: Burnell Blanks, MD;  Location: [facility] CATH LAB;  Service: Cardiovascular;  Laterality: N/A;   PERCUTANEOUS CORONARY STENT INTERVENTION (PCI-S)  07/28/2012   Procedure: PERCUTANEOUS CORONARY STENT INTERVENTION (PCI-S);  Surgeon: Burnell Blanks, MD;  Location: [facility] C[facility] LAB;  Service: Cardiovascular;;   TONSILLECTOMY AND ADENOIDECTOMY  ~ 1951    Allergies  Allergies  Allergen Reactions   Betadine [Povidone Iodine] Shortness Of Breath and Swelling   Codeine Anaphylaxis and Shortness Of Breath    "quit breathing" (07/28/2012) Tolerates 1-2 shots of morphine   Demerol Anaphylaxis    "quit breathing" (07/28/2012)   Iohexol Anaphylaxis  Finger/ankle swelling   Latex Anaphylaxis    "quit breathing" (07/28/2012)   Other Anaphylaxis    Perfume, Any Fragrance. Cleaning Fluids.  "quit breathing" (07/28/2012)   Percocet [Oxycodone-Acetaminophen] Anaphylaxis    "quit breathing; disoriented" (07/28/2012)   Plavix [Clopidogrel Bisulfate] Anaphylaxis    "get hot; like I'm burning up inside; had to put me in shower after OHS because of that" (07/28/2012)   Red Dye Anaphylaxis    "quit breathing" (07/28/2012)    Shellfish Allergy Shortness Of Breath    "broke out in knots all over" (07/28/2012)   Sulfonamide Derivatives Shortness Of Breath and Rash    "quit breathing" (07/28/2012)   Tylenol [Acetaminophen] Shortness Of Breath and Itching   Azilsartan     Other reaction(s): muscle pain   Azithromycin     Other reaction(s): she cannot take it. felt like she would die. legs would not work , painful and had to get in the shower and had diarrhea.   Barbiturates     Other reaction(s): SOB   Cardizem [Diltiazem]     Other reaction(s): took one dose.  felt very sick, bad. said raised bp more.   Ciprofloxacin     Other reaction(s): after one does had charlie horses all over her legs and could not go back to sleep   Clonidine     Other reaction(s): dizziness   Crestor [Rosuvastatin]     Other reaction(s): RASH   Ezetimibe     Other reaction(s): RASH   Fluogen [Influenza Virus Vaccine]     Other reaction(s): gets very sick and thinks it is the mercury level.   Furosemide     Other reaction(s): makes her too tired.   Hydralazine Hcl     Other reaction(s): diarrhea   Hydrochlorothiazide     Other reaction(s): broke out with blisters on her mouth   Miralax [Polyethylene Glycol] Other (See Comments)    GI upset severe   Omega-3-Acid Ethyl Esters     Other reaction(s): bad diarrhea   Pravastatin Sodium Other (See Comments)    Leg pain, problems ambulating    Spironolactone     Other reaction(s): states she doesn't want to take it as it made her dehydrated in the past.   Statins     Muscle pain   Sulfa Antibiotics     Other reaction(s): swelling/SOB   Vancomycin     Other reaction(s): pt states it caused her major abd pain   Warfarin     Other reaction(s): CAN'T TAKE, GETS HOT/BREAKS OUT Other reaction(s): CAN'T TAKE, GETS HOT/BREAKS OUT   Imdur [Isosorbide Nitrate] Other (See Comments)    Intolerance. "Can't remember the reaction."    History of Present Illness    Jericka L Denis has a  PMH of chronic constipation, hepatic steatosis, GERD, coronary artery disease status post CABG status post DES, diabetes, hypertension, IBS, and suspected  mesenteric ischemia.  She was seen by Dr. Claiborne Billings on 01/23/2022.  During that time she was no longer taking hydralazine on a daily basis.  She was taking amlodipine 7.5 mg.  She was no longer taking Lindzen's.  She was taking metoprolol 100 mg twice daily, valsartan 160 mg twice daily, prasugrel 10 mg daily and was taking Praluent 75 mcg/mL.  She continued to have difficulty with her diverticular disease.  She was noted to have significant abdominal bloating, and taking Gas-X regularly.  She denied exertional chest tightness.  She was seen by GI on 03/17/2022.  Due to suspected mesenteric  ischemia and they recommended vascular surgery evaluation.  She was started on dicyclomine.  Instructed to continue Gas-X 4 times per day and recommendation was made for mild laxative.  She contacted the nurse triage line on 03/18/2022 with complaints of elevated blood pressure 214/107, black eye, and light chest pain which she felt was related to GI symptoms.  She reported relief with belching.  Her blood pressure at the time of the call was 138/52.  She presents to the clinic today for follow-up evaluation states she was recently in the hospital with diverticulitis and rectal bleeding.  She was admitted at [facility].  She comes in today for posthospital follow-up and evaluation of her chest discomfort.  She reports that she is still having slight amount of bleeding on toilet paper which she feels is related to her hemorrhoids.  We reviewed recommendations for following up with GI.  Her EKG today shows sinus bradycardia.  She reports that she has only been taking valsartan once daily.  Her blood pressure today is 140s over 60s.  I will restart her valsartan twice daily.  We will continue her other medications as prescribed, have her increase her physical activity  as tolerated, give heart healthy low-sodium diet information, and plan follow-up in 2-3 months.  Today she denies  shortness of breath, lower extremity edema, fatigue, palpitations, melena, hematuria, hemoptysis, diaphoresis, weakness, presyncope, syncope, orthopnea, and PND.    Home Medications    Prior to Admission medications   Medication Sig Start Date End Date Taking? Authorizing Provider  amLODipine (NORVASC) 5 MG tablet Take 5 mg twice a day 01/23/22   Troy Sine, MD  aspirin EC 81 MG tablet Take 81 mg by mouth daily.    [provider]  BD PEN NEEDLE NANO 2ND GEN 32G X 4 MM MISC USE THREE TIMES DAILY AS DIRECTED WITH HUMALOG AND TOUJEO 06/04/20   [provider]  calcium carbonate (TUMS - DOSED IN MG ELEMENTAL CALCIUM) 500 MG chewable tablet Chew 1-2 tablets by mouth 3 (three) times daily as needed for heartburn.     [provider]  cholecalciferol (VITAMIN D) 1000 UNITS tablet Take 1,000 Units by mouth daily.    [provider]  CRANBERRY-VITAMIN C PO Take 1 tablet by mouth daily.    [provider]  dicyclomine (BENTYL) 20 MG tablet Take 0.5 tablets (10 mg total) by mouth 3 (three) times daily before meals. 03/17/22   Ladene Artist, MD  EPINEPHrine 0.3 mg/0.3 mL IJ SOAJ injection Inject 0.3 mg into the muscle as needed. 10/05/19   [provider]  HUMALOG KWIKPEN 100 UNIT/ML KwikPen Inject 5 Units into the skin in the morning and at bedtime. 06/17/20   [provider]  Insulin Glargine (TOUJEO MAX SOLOSTAR ) Inject 50 Units into the skin daily.     [provider]  Lancets (ONETOUCH DELICA PLUS CHENID78E) [place] USE TO [place] DAILY 06/03/20   [provider]  metoprolol tartrate (LOPRESSOR) 100 MG tablet TAKE 1 TABLET(100 MG) BY MOUTH TWICE DAILY Patient taking differently: Take 100 mg by mouth 2 (two) times daily. 03/07/20   Troy Sine, MD  nitrofurantoin (MACRODANTIN) 100  MG capsule Take 100 mg by mouth daily. 11/21/21   [provider]  nitroGLYCERIN (NITROSTAT) 0.4 MG SL tablet Place 1 tablet (0.4 mg total) under the tongue every 5 (five) minutes as needed for chest pain (up to 3 doses). 08/21/19   Purcell Nails,  Phill Myron, NP  [facility] VERIO test strip  07/23/20   [provider]  PRALUENT 75 MG/ML SOAJ ADMINISTER 1 ML UNDER THE SKIN EVERY 14 DAYS 09/09/21   Troy Sine, MD  prasugrel (EFFIENT) 10 MG TABS tablet TAKE 1 TABLET(10 MG) BY MOUTH DAILY 12/11/21   Troy Sine, MD  Probiotic Product (PROBIOTIC-10 PO) Take 1 capsule by mouth daily.    [provider]  valsartan (DIOVAN) 160 MG tablet TAKE 1 TABLET(160 MG) BY MOUTH TWICE DAILY Patient taking differently: Take 160 mg by mouth 2 (two) times daily. 02/24/19   Troy Sine, MD  vitamin B-12 (CYANOCOBALAMIN) 1000 MCG tablet Take 1,000 mcg by mouth daily.    [provider]    Family History    Family History  Problem Relation Age of Onset   Coronary artery disease Father    Colon cancer Father    Colon polyps Father    Heart disease Father    Stroke Mother    Hypertension Other    Breast cancer Other        grandmother   Diabetes Maternal Grandmother    Diabetes Paternal Grandmother    Esophageal cancer Neg Hx    Rectal cancer Neg Hx    Stomach cancer Neg Hx    She indicated that her mother is deceased. She indicated that her father is deceased. She indicated that her maternal grandmother is deceased. She indicated that her maternal grandfather is deceased. She indicated that her paternal grandmother is deceased. She indicated that her paternal grandfather is deceased. She indicated that the status of her neg hx is unknown. She indicated that the status of her other is unknown.  Social History    Social History   Socioeconomic History   Marital status: Widowed    Spouse name: Not on file   Number of children: 2   Years of education: Not on file    Highest education level: Not on file  Occupational History   Occupation: retired  Tobacco Use   Smoking status: Never   Smokeless tobacco: Never  Vaping Use   Vaping Use: Never used  Substance and Sexual Activity   Alcohol use: No    Alcohol/week: 0.0 standard drinks of alcohol   Drug use: No   Sexual activity: Never  Other Topics Concern   Not on file  Social History Narrative   Not on file   Social Determinants of Health   Financial Resource Strain: Not on file  Food Insecurity: Not on file  Transportation Needs: Not on file  Physical Activity: Not on file  Stress: Not on file  Social Connections: Not on file  Intimate Partner Violence: Not on file     Review of Systems    General:  No chills, fever, night sweats or weight changes.  Cardiovascular:  No chest pain, dyspnea on exertion, edema, orthopnea, palpitations, paroxysmal nocturnal dyspnea. Dermatological: No rash, lesions/masses Respiratory: No cough, dyspnea Urologic: No hematuria, dysuria Abdominal:   No nausea, vomiting, diarrhea, bright red blood per rectum, melena, or hematemesis Neurologic:  No visual changes, wkns, changes in mental status. All other systems reviewed and are otherwise negative except as noted above.  Physical Exam    VS:  BP (!) 142/60   Pulse 63   Ht 5' 1.5" (1.562 m)   Wt 178 lb (80.7 kg)   SpO2 98%   BMI 33.09 kg/m  , BMI Body mass index is 33.09 kg/m. GEN: Well nourished,  well developed, in no acute distress. HEENT: normal. Neck: Supple, no JVD, carotid bruits, or masses. Cardiac: RRR, no murmurs, rubs, or gallops. No clubbing, cyanosis, edema.  Radials/DP/PT 2+ and equal bilaterally.  Respiratory:  Respirations regular and unlabored, clear to auscultation bilaterally. GI: Soft, nontender, nondistended, BS + x 4. MS: no deformity or atrophy. Skin: warm and dry, no rash. Neuro:  Strength and sensation are intact. Psych: Normal affect.  Accessory Clinical Findings     Recent Labs: 07/23/2021: ALT 10; BUN 15; Creatinine, Ser 0.72; Potassium 3.7; Sodium 135 02/19/2022: Hemoglobin 13.6; Platelets 268.0   Recent Lipid Panel    Component Value Date/Time   CHOL 300 (H) 06/21/2019 1855   TRIG 183 (H) 06/21/2019 1855   HDL 39 (L) 06/21/2019 1855   CHOLHDL 7.7 06/21/2019 1855   VLDL 37 06/21/2019 1855   LDLCALC 224 (H) 06/21/2019 1855   LDLDIRECT 160.9 06/21/2012 0833    ECG personally reviewed by me today-sinus bradycardia 59 bpm- No acute changes  Echocardiogram 06/22/2019  IMPRESSIONS     1. Left ventricular ejection fraction, by visual estimation, is 60 to  65%. The left ventricle has normal function. Normal left ventricular size.  Left ventricular septal wall thickness was mildly increased. Mildly  increased left ventricular posterior  wall thickness. There is mildly increased left ventricular hypertrophy.   2. Left ventricular diastolic Doppler parameters are consistent with  impaired relaxation pattern of LV diastolic filling.   3. Global right ventricle has normal systolic function.The right  ventricular size is normal. No increase in right ventricular wall  thickness.   4. Left atrial size was normal.   5. Right atrial size was normal.   6. Mild calcification of the anterior mitral valve leaflet(s).   7. Mild thickening of the anterior mitral valve leaflet(s).   8. The mitral valve is normal in structure. Trace mitral valve  regurgitation. No evidence of mitral stenosis.   9. The tricuspid valve is normal in structure. Tricuspid valve  regurgitation is trivial.  10. The aortic valve is normal in structure. Aortic valve regurgitation  was not visualized by color flow Doppler. Mild aortic valve sclerosis  without stenosis.  11. Mildly calcified and thickened aortic valve leaflets.  12. The pulmonic valve was normal in structure. Pulmonic valve  regurgitation is not visualized by color flow Doppler.  13. Mildly elevated pulmonary  artery systolic pressure.  14. The inferior vena cava is normal in size with greater than 50%  respiratory variability, suggesting right atrial pressure of 3 mmHg.  Cardiac catheterization 06/23/2019  SVG. Prox Graft to Mid Graft lesion is 90% stenosed. A drug-eluting stent was successfully placed using a STENT SYNERGY DES 3.5X32. Post intervention, there is a 0% residual stenosis. SVG. Dist Graft lesion is 95% stenosed. Balloon angioplasty was performed using a BALLOON SAPPHIRE [place] 3.5X15. Post intervention, there is a 0% residual stenosis.   1. Successful PCI of the SVG to OM with DES x 1 2. Successful PCI of the SVG to RCA with POBA.    Plan: DAPT for at least one year and I would consider indefinitely. Anticipate DC in am if stable.    Diagnostic Dominance: Right  Intervention   Assessment & Plan   1.  Essential hypertension-BP today 142/60.  Contacted nurse triage line on 03/16/2022 and reported blood pressures in the 200s over 100s.  At the time of the call her blood pressure was 138/52. Continue amlodipine, metoprolol,  Resume valsartan twice daily Heart healthy low-sodium  diet-salty 6 given Increase physical activity as tolerated Repeat BMP in 2 weeks.  Coronary artery disease-reports some chest pressure today.  Notes improvement with Gas-X and belching.  Status post CABG.  Stress test 02/24/2017 showed LVEF of 55-65% and intermediate risk study.  Cardiac catheterization 06/23/2019 showed proximal-mid graft 90% stenosed SVG, received PCI with DES.  Distal SVG graft 95% stenosed, received balloon angioplasty.  EKG today shows sinus bradycardia 59 bpm. Continue amlodipine, metoprolol, aspirin, Praluent Heart healthy low-sodium diet-salty 6 given Increase physical activity as tolerated   Hyperlipidemia-reports compliance with Praluent therapy. Continue Praluent Heart healthy low-sodium high-fiber diet increase physical activity as tolerated  Diverticular disease,  suspected mesenteric ischemia-had appointment with GI on 03/17/2022.  Recommendation for follow-up with vascular surgery was made. Keep follow-up appointment with surgeon.  Disposition: Follow-up with Dr. Claiborne Billings in 2-3 months.   Jossie Ng. Rider Ermis NP-C     03/20/2022, 11:47 AM [address] Office [phone] Fax [phone]  Notice: This dictation was prepared with Dragon dictation along with smaller phrase technology. Any transcriptional errors that result from this process are unintentional and may not be corrected upon review.  I spent 14 minutes examining this patient, reviewing medications, and using patient centered shared decision making involving her cardiac care.  Prior to her visit I spent greater than 20 minutes reviewing her past medical history,  medications, and prior cardiac tests.

## 2022-03-18 NOTE — Telephone Encounter (Signed)
Patient called in to triage wanting to speak with me. She saw her PCP yesterday who recommended she be seen . Appointment made with Thomasene Mohair for 7/14.

## 2022-03-20 ENCOUNTER — Encounter: Payer: Self-pay | Admitting: General Practice

## 2022-03-20 ENCOUNTER — Ambulatory Visit: Payer: Medicare Other | Admitting: General Practice

## 2022-03-20 VITALS — BP 142/60 | HR 63 | Ht 61.5 in | Wt 178.0 lb

## 2022-03-20 DIAGNOSIS — K5790 Diverticulosis of intestine, part unspecified, without perforation or abscess without bleeding: Secondary | ICD-10-CM | POA: Diagnosis not present

## 2022-03-20 DIAGNOSIS — E785 Hyperlipidemia, unspecified: Secondary | ICD-10-CM

## 2022-03-20 DIAGNOSIS — I2510 Atherosclerotic heart disease of native coronary artery without angina pectoris: Secondary | ICD-10-CM | POA: Diagnosis not present

## 2022-03-20 DIAGNOSIS — I10 Essential (primary) hypertension: Secondary | ICD-10-CM

## 2022-03-20 MED ORDER — VALSARTAN 160 MG PO TABS
160.0000 mg | ORAL_TABLET | Freq: Two times a day (BID) | ORAL | 3 refills | Status: DC
Start: 2022-03-20 — End: 2022-03-20

## 2022-03-20 MED ORDER — VALSARTAN 160 MG PO TABS: 160.0000 mg | ORAL_TABLET | Freq: Two times a day (BID) | ORAL | 3 refills | Status: DC

## 2022-03-20 MED ORDER — VALSARTAN 160 MG PO TABS: 160.0000 mg | ORAL_TABLET | Freq: Two times a day (BID) | ORAL | 3 refills | Status: AC

## 2022-03-20 NOTE — Addendum Note (Signed)
Addended by: Waylan Rocher on: 03/20/2022 02:07 PM   Modules accepted: Orders

## 2022-03-20 NOTE — Patient Instructions (Signed)
Medication Instructions:  TAKE VALSARTAN TWICE DAILY  *If you need a refill on your cardiac medications before your next appointment, please call your pharmacy*  Lab Work:   Testing/Procedures:  NONE    NONE  If you have labs (blood work) drawn today and your tests are completely normal, you will receive your results only by: Mar[facility] (if you have MyChart) OR  A paper copy in the mail If you have any lab test that is abnormal or we need to change your treatment, we will call you to review the results.  Special Instructions PLEASE READ AND FOLLOW SALTY 6-ATTACHED-1,871m daily  PLEASE INCREASE PHYSICAL ACTIVITY AS TOLERATED   Follow-Up: Your next appointment:  2-3 month(s) In Person with TShelva Majestic MD  or JColetta Memos FNP     1  [facility] you and your health needs are our priority.  As part of our continuing mission to provide you with exceptional heart care, we have created designated Provider Care Teams.  These Care Teams include your primary Cardiologist (physician) and Advanced Practice Providers (APPs -  Physician Assistants and Nurse Practitioners) who all work together to provide you with the care you need, when you need it.  We recommend signing up for the patient portal called "MyChart".  Sign up information is provided on this After Visit Summary.  MyChart is used to connect with patients for Virtual Visits (Telemedicine).  Patients are able to view lab/test results, encounter notes, upcoming appointments, etc.  Non-urgent messages can be sent to your provider as well.   To learn more about what you can do with MyChart, go to [email or web address]    Important Information About Sugar             6 SALTY THINGS TO AVOID     1,800MG DAILY

## 2022-04-02 ENCOUNTER — Ambulatory Visit: Payer: Medicare Other | Admitting: Podiatry

## 2022-04-02 DIAGNOSIS — M79674 Pain in right toe(s): Secondary | ICD-10-CM | POA: Diagnosis not present

## 2022-04-02 DIAGNOSIS — B351 Tinea unguium: Secondary | ICD-10-CM

## 2022-04-02 DIAGNOSIS — M79675 Pain in left toe(s): Secondary | ICD-10-CM

## 2022-04-02 DIAGNOSIS — L600 Ingrowing nail: Secondary | ICD-10-CM

## 2022-04-02 NOTE — Patient Instructions (Signed)
   Place 1/4 cup of epsom salts (or betadine, or white vinegar) in a quart of warm tap water.  Submerge your foot or feet with outer bandage intact for the initial soak; this will allow the bandage to become moist and wet for easy lift off.  Once you remove your bandage, continue to soak in the solution for 20 minutes.  This soak should be done twice a day.  Next, remove your foot or feet from solution, blot dry the affected area. File the hard skin with an emory board and apply lotion here

## 2022-04-02 NOTE — Progress Notes (Signed)
  Subjective:  Patient ID: AUBRY RANKIN, female    DOB: 1946/07/04,  MRN: [id number]  Chief Complaint  Patient presents with   Ingrown Toenail    diabetic with right great toenail ingrown    76 y.o. female presents with the above complaint. History confirmed with patient.  She is not noticed any drainage.  Her A1c is 6.9%.  Objective:  Physical Exam: warm, good capillary refill, no trophic changes or ulcerative lesions, normal DP and PT pulses, normal monofilament exam, normal sensory exam, and she has mild ingrowing left hallux nail medial border, no paronychia no drainage no signs of infection.  Remaining nails dystrophic yellowed discolored and subungual debris  Assessment:   1. Pain due to onychomycosis of toenails of both feet   2. Ingrowing left great toenail      Plan:  Patient was evaluated and treated and all questions answered.  All nails were debrided in length and thickness today.  We discussed routine regular [place] of this and she will consider this.  The left hallux ingrown border did not have any signs of infection and debridement of the offending border in a slant back fashion alleviated all her pain and pressure.  I discussed with her if this returns or does not improve then I would recommend partial permanent medial matricectomy.  She will return to see me as needed  Return if symptoms worsen or fail to improve.

## 2022-06-01 ENCOUNTER — Telehealth: Payer: Self-pay | Admitting: Cardiovascular Disease

## 2022-06-01 NOTE — Telephone Encounter (Signed)
Received call into triage. Patient states that she has been having chest discomfort for the last week. She states she takes gas x and this does improve the symptoms. She does have a lot of GI issues (they are discussing having a colonoscopy to see what is going on) she states she is concerned something is going on with her heart. We did discuss if things improved with the gas x this was GI related. Patient states she would like to see someone to discuss further. She wanted to see Dr.[name] (no appointment open) I did get scheduled with Almyra Deforest, PA for tomorrow. Patient aware she will have an EKG and will discuss symptoms. She does mention being short of breath with activities as well.patient aware if sharp chest pain occurs she should go to ED, patient refused to do this- states she will never go to the ED "she hates that place".  Will route to PA for appointment tomorrow.   Thanks!

## 2022-06-01 NOTE — Telephone Encounter (Signed)
Pt c/o of Chest Pain: STAT if CP now or developed within 24 hours  1. Are you having CP right now?  Yes   2. Are you experiencing any other symptoms (ex. SOB, nausea, vomiting, sweating)?  Stomach swelling--mentions GI issues and upcoming colonoscopy  3. How long have you been experiencing CP?  About 1 week   4. Is your CP continuous or coming and going?  Continuous   5. Have you taken Nitroglycerin?   No   ?

## 2022-06-02 ENCOUNTER — Ambulatory Visit: Payer: Medicare Other | Attending: Physician Assistant | Admitting: Physician Assistant

## 2022-06-02 ENCOUNTER — Encounter: Payer: Self-pay | Admitting: Physician Assistant

## 2022-06-02 VITALS — BP 142/62 | HR 67 | Ht 61.5 in | Wt 174.8 lb

## 2022-06-02 DIAGNOSIS — I10 Essential (primary) hypertension: Secondary | ICD-10-CM

## 2022-06-02 DIAGNOSIS — E785 Hyperlipidemia, unspecified: Secondary | ICD-10-CM | POA: Diagnosis not present

## 2022-06-02 DIAGNOSIS — R079 Chest pain, unspecified: Secondary | ICD-10-CM

## 2022-06-02 DIAGNOSIS — I25709 Atherosclerosis of coronary artery bypass graft(s), unspecified, with unspecified angina pectoris: Secondary | ICD-10-CM | POA: Diagnosis not present

## 2022-06-02 DIAGNOSIS — E119 Type 2 diabetes mellitus without complications: Secondary | ICD-10-CM

## 2022-06-02 NOTE — Progress Notes (Unsigned)
Cardiology Office Note:    Date:  06/04/2022   ID:  NICHOLE NEYER, DOB 04-08-1946, MRN [id number]  PCP:  Crist Infante, [place] Prov[facility] Cardiologist:  [name], MD     Referring MD: Crist Infante, MD   Chief Complaint  Patient presents with   Follow-up    Seen for Dr. Claiborne Billings    History of Present Illness:    EMIL KLASSEN is a 76 y.o. female with a hx of CAD s/p CABG x4 in 2004-11-24 at [facility] by Dr. Marzetta Merino (LIMA-LAD, SVG-diagonal, SVG-OM, and SVG-RCA), hyperlipidemia, hypertension, DM 2.  She moved to [place] area in Nov 24, 2010 after her husband died.  In 25-Nov-2011, she underwent cardiac catheterization and underwent DES to RCA.  She is a former patient of Dr. Einar Gip.  Echocardiogram in 11-24-2012 showed normal EF, mild LVH, mild to moderate MR, mild TR and PA pressure 39 mmHg.  She has many allergies including amlodipine and the spironolactone.  She also has history of labile hypertension.  In the past, she became dehydrated on diuretic therapy.  Echocardiogram in July 2016 showed EF 55 to 60%, mild MR, mild LAE, PA pressure 32 mmHg.  Myoview obtained on 03/21/2015 was low risk, very small region of mild ischemia in the basal anterolateral wall, EF 65%.  Repeat cardiac catheterization in June 2018 revealed EF 55 to 60%, significant multivessel CAD with 70% diffuse proximal LAD lesion, total occlusion of left circumflex and OM1, 75% proximal RCA lesion with occluded RCA prior to acute marginal.  She had patent LIMA to mid LAD, SVG to distal left circumflex marginal had small to 60% mid body stenosis with TIMI-3 flow, SVG to distal RCA has 60% stenosis followed by 99% thrombotic stenosis beyond a stented segment.  She underwent successful PCI to SVG to RCA with insertion of 3.5 x 32 mm Synergy DES postdilated to 3.71 mm.  It was recommended she continue dual antiplatelet therapy indefinitely.  She takes Effient however does not tolerate Plavix or Brilinta.  She is  intolerant of statins as well.  She was now interested in PCSK9 inhibitor.  Repeat cardiac catheterization on 06/22/2019 showed restenosis of the previously placed stent to SVG to RCA and also vein graft to left circumflex, she eventually underwent staged intervention with stenting of SVG to OM 1 and PTCA for in-stent restenosis of SVG to RCA.  Her anginal symptom was abdominal pain.  Patient was last seen by Dr. Claiborne Billings in May 2023 at which time she was no longer taking hydralazine on a daily basis.  She was taking amlodipine 7.5 mg.  She was also not taking Lindzen's.  She was on metoprolol 100 mg twice a day and valsartan 160 mg twice a day.  She was taking Gas-X regularly due to significant abdominal bloating.  Patient was seen by GI in July, due to suspected mesenteric ischemia, patient was referred to vascular surgery.  She was started on dicyclomine.  She was last seen by Coletta Memos on 03/20/2022 due to elevated blood pressure.  She was restarted on losartan twice a day.  Patient presents today for follow-up.  Since the last follow-up, patient and vascular ultrasound at [facility] for superior mesenteric artery stenosis on 05/07/2022.  Result is not available to me.  Patient called cardiology service yesterday due to chest discomfort.  Patient presents today for evaluation of chest discomfort that has been going on for the past several weeks.  She is also  dealing with significant abdominal issue is scheduled to see her GI doctor tomorrow.  She is taking Gas-X for abdominal bloating.  She says the chest discomfort usually occurs not with exertion but more with food and abdominal pain.  Unfortunately this is a patient who's previous anginal symptom often present as abdominal discomfort rather than chest discomfort.  She was quite tearful in the office today as she is dealing with significant stress from her son's poor health.  She was told her sons cardiac function is less than 20%.  I recommend a nuclear stress  test to evaluate her intermittent chest discomfort.   Past Medical History:  Diagnosis Date   Anxiety    Aortic atherosclerosis (HCC)    CAD (coronary artery disease)    a. CABG x 4 in 2006 in [place] (LIMA->LAD, VG->Diag, VG->OM, VG->RCA); b. DES to midbody of SVG-PDA/PLA 07/2012; c. 03/2015 Myoview: EF 65%, small defect of mild severit in basal inferolateral wall, low risk.   Complication of anesthesia    "quit breathing when I got ?[place]" (07/28/2012)   Coronary atherosclerosis    Depression    Diverticulitis    Diverticulosis    Dyslipidemia    Intolerant of statins   Fecal incontinence    GERD (gastroesophageal reflux disease)    Hyperlipidemia    Hyperplastic colon polyp    IBS (irritable bowel syndrome)    Labile hypertension    a. 09/2016 Renal Artery duplex: nl renal arteries.   Migraines    "had them in my 54's" (07/28/2012)   Multiple allergies    Obesity    Steatohepatitis    Tubular adenoma of colon    Type II diabetes mellitus ([place])    a. no meds, diet controlled - takes cinnamon.    Past Surgical History:  Procedure Laterality Date   ABDOMINAL HYSTERECTOMY  1970's   BREAST LUMPECTOMY     bilateral   CARDIAC CATHETERIZATION  2006   CORONARY ANGIOPLASTY WITH STENT PLACEMENT  07/28/2012   "1; first time for me" (07/28/2012)   CORONARY ARTERY BYPASS GRAFT  2006   CABG X4   CORONARY STENT INTERVENTION N/A 03/01/2017   Procedure: Coronary Stent Intervention;  Surgeon: Troy Sine, MD;  Location: [place] [facility];  Service: Cardiovascular;  Laterality: N/A;   CORONARY STENT INTERVENTION N/A 06/23/2019   Procedure: CORONARY STENT INTERVENTION;  Surgeon: Martinique, Peter M, MD;  Location: [place] [facility];  Service: Cardiovascular;  Laterality: N/A;   EXCISIONAL HEMORRHOIDECTOMY  1970's   INGUINAL HERNIA REPAIR  1970's?   left   LEFT HEART CATH AND CORS/GRAFTS ANGIOGRAPHY N/A 03/01/2017   Procedure: Left Heart Cath and Cors/Grafts Angiography;   Surgeon: Troy Sine, MD;  Location: [place] [facility];  Service: Cardiovascular;  Laterality: N/A;   LEFT HEART CATH AND CORS/GRAFTS ANGIOGRAPHY N/A 06/22/2019   Procedure: LEFT HEART CATH AND CORS/GRAFTS ANGIOGRAPHY;  Surgeon: Belva Crome, MD;  Location: [place] [facility];  Service: Cardiovascular;  Laterality: N/A;   LEFT HEART CATHETERIZATION WITH CORONARY/GRAFT ANGIOGRAM N/A 07/28/2012   Procedure: LEFT HEART CATHETERIZATION WITH Beatrix Fetters;  Surgeon: Burnell Blanks, MD;  Location: [facility] C[facility] LAB;  Service: Cardiovascular;  Laterality: N/A;   PERCUTANEOUS CORONARY STENT INTERVENTION (PCI-S)  07/28/2012   Procedure: PERCUTANEOUS CORONARY STENT INTERVENTION (PCI-S);  Surgeon: Burnell Blanks, MD;  Location: [facility] CATH LAB;  Service: Cardiovascular;;   TONSILLECTOMY AND ADENOIDECTOMY  ~ 1951    Current Medications: Current Meds  Medication  Sig   amLODipine (NORVASC) 2.5 MG tablet Take 2.5 mg by mouth every evening.   amLODipine (NORVASC) 5 MG tablet Take 5 mg twice a day   aspirin EC 81 MG tablet Take 81 mg by mouth daily.   BD PEN NEEDLE NANO 2ND GEN 32G X 4 MM MISC USE THREE TIMES DAILY AS DIRECTED WITH HUMALOG AND TOUJEO   calcium carbonate (TUMS - DOSED IN MG ELEMENTAL CALCIUM) 500 MG chewable tablet Chew 1-2 tablets by mouth 3 (three) times daily as needed for heartburn.    cholecalciferol (VITAMIN D) 1000 UNITS tablet Take 1,000 Units by mouth daily.   CRANBERRY-VITAMIN C PO Take 1 tablet by mouth daily.   EPINEPHrine 0.3 mg/0.3 mL IJ SOAJ injection Inject 0.3 mg into the muscle as needed.   HUMALOG KWIKPEN 100 UNIT/ML KwikPen Inject 5 Units into the skin in the morning and at bedtime.   Insulin Glargine (TOUJEO MAX SOLOSTAR [place]) Inject 50 Units into the skin daily.    Lancets (ONETOUCH DELICA PLUS [id number]) MISC USE TO CHECK BLOOD SUGAR FOUR TIMES DAILY   metoprolol tartrate (LOPRESSOR) 100 MG tablet TAKE 1 TABLET(100 MG) BY MOUTH TWICE DAILY (Patient  taking differently: Take 100 mg by mouth 2 (two) times daily.)   nitrofurantoin (MACRODANTIN) 100 MG capsule Take 100 mg by mouth daily.   nitroGLYCERIN (NITROSTAT) 0.4 MG SL tablet Place 1 tablet (0.4 mg total) under the tongue every 5 (five) minutes as needed for chest pain (up to 3 doses).   ONETOUCH VERIO test strip    PRALUENT 75 MG/ML SOAJ ADMINISTER 1 ML UNDER THE SKIN EVERY 14 DAYS   prasugrel (EFFIENT) 10 MG TABS tablet TAKE 1 TABLET(10 MG) BY MOUTH DAILY   Probiotic Product (PROBIOTIC-10 PO) Take 1 capsule by mouth daily.   valsartan (DIOVAN) 160 MG tablet Take 1 tablet (160 mg total) by mouth 2 (two) times daily. TAKE 1 TABLET(160 MG) BY MOUTH TWICE DAILY Strength: 160 mg   vitamin B-12 (CYANOCOBALAMIN) 1000 MCG tablet Take 1,000 mcg by mouth daily.     Allergies:   Betadine [povidone iodine], Codeine, Demerol, Iohexol, Latex, Other, Percocet [oxycodone-acetaminophen], Plavix [clopidogrel bisulfate], Red dye, Shellfish allergy, Sulfonamide derivatives, Tylenol [acetaminophen], Azilsartan, Azithromycin, Barbiturates, Cardizem [diltiazem], Ciprofloxacin, Clonidine, Crestor [rosuvastatin], Ezetimibe, Fluogen [influenza virus vaccine], Furosemide, Hydralazine hcl, Hydrochlorothiazide, Miralax [polyethylene glycol], Omega-3-acid ethyl esters, Pravastatin sodium, Spironolactone, Statins, Sulfa antibiotics, Vancomycin, Warfarin, and Imdur [isosorbide nitrate]   Social History   Socioeconomic History   Marital status: Widowed    Spouse name: Not on file   Number of children: 2   Years of education: Not on file   Highest education level: Not on file  Occupational History   Occupation: retired  Tobacco Use   Smoking status: Never   Smokeless tobacco: Never  Vaping Use   Vaping Use: Never used  Substance and Sexual Activity   Alcohol use: No    Alcohol/week: 0.0 standard drinks of alcohol   Drug use: No   Sexual activity: Never  Other Topics Concern   Not on file  Social History  Narrative   Not on file   Social Determinants of Health   Financial Resource Strain: Not on file  Food Insecurity: Not on file  Transportation Needs: Not on file  Physical Activity: Not on file  Stress: Not on file  Social Connections: Not on file     Family History: The patient's family history includes Breast cancer in an other family member; Colon cancer in her  father; Colon polyps in her father; Coronary artery disease in her father; Diabetes in her maternal grandmother and paternal grandmother; Heart disease in her father; Hypertension in an other family member; Stroke in her mother. There is no history of Esophageal cancer, Rectal cancer, or Stomach cancer.  ROS:   Please see the history of present illness.     All other systems reviewed and are negative.  EKGs/Labs/Other Studies Reviewed:    The following studies were reviewed today:  Myoview 02/24/2017 Nuclear stress EF: 55%. The left ventricular ejection fraction is normal (55-65%). Defect 1: There is a medium defect of moderate severity present in the basal inferior, basal inferolateral, mid inferior, apical inferior and apical lateral location. Findings consistent with inferolateral ischemia. This is an intermediate risk study.   Cath 03/01/2017 Prox Cx to Mid Cx lesion, 100 %stenosed. 1st Mrg lesion, 100 %stenosed. Ost LAD to Prox LAD lesion, 70 %stenosed. Prox RCA lesion, 75 %stenosed. Mid RCA lesion, 100 %stenosed. SVG. Prox Graft lesion, 60 %stenosed. LIMA. SVG. Dist Graft lesion, 95 %stenosed. A STENT SYNERGY DES 3.5X32 drug eluting stent was successfully placed. Mid Graft lesion, 60 %stenosed. Post intervention, there is a 0% residual stenosis. The left ventricular systolic function is normal. LV end diastolic pressure is normal. The left ventricular ejection fraction is 55-65% by visual estimate.   Preserved global LV contractility with an ejection fraction of  55-65%   Significant multivessel  native CAD with 70% diffuse proximal LAD stenosis, total occlusion of the circumflex and OM1 vessel proximally, and 75% proximal RCA stenosis with occlusion of the RCA prior to the acute margin.   Patent LIMA graft which supplies the mid LAD and may also anastomose into the diagonal vessel.   SVG supplying the distal marginal branch of the circumflex vessel with smooth 60% proximal to mid body stenosis and TIMI-3 flow.   SVG supplying the distal RCA with previously noted stented segment with focal 60% stenosis followed by 95-99% thrombotic stenosis beyond the stented segment.   Successful PCI to the SVG to the RCA treated with PTCA and stenting with a 3.532 mm Synergy stent postdilated to 3.71 mm with a Sport and exercise psychologist wire for distal graft protection from embolization.   RECOMMENDATION: The patient should continue dual antiplatelet therapy indefinitely.  Medical therapy will need to be adjusted.  The patient has been resistant to take medicines in the past.  She will also be rechallenged with low dose statin therapy.    Cath 06/23/2019 SVG. Prox Graft to Mid Graft lesion is 90% stenosed. A drug-eluting stent was successfully placed using a STENT SYNERGY DES 3.5X32. Post intervention, there is a 0% residual stenosis. SVG. Dist Graft lesion is 95% stenosed. Balloon angioplasty was performed using a BALLOON SAPPHIRE [place] 3.5X15. Post intervention, there is a 0% residual stenosis.   1. Successful PCI of the SVG to OM with DES x 1 2. Successful PCI of the SVG to RCA with POBA.    Plan: DAPT for at least one year and I would consider indefinitely. Anticipate DC in am if stable.   EKG:  EKG is ordered today.  The ekg ordered today demonstrates normal sinus rhythm, no significant ST-T wave changes.  Recent Labs: 07/23/2021: ALT 10; BUN 15; Creatinine, Ser 0.72; Potassium 3.7; Sodium 135 02/19/2022: Hemoglobin 13.6; Platelets 268.0  Recent Lipid Panel    Component Value Date/Time    CHOL 300 (H) 06/21/2019 1855   TRIG 183 (H) 06/21/2019 1855   HDL 39 (L)  06/21/2019 1855   CHOLHDL 7.7 06/21/2019 1855   VLDL 37 06/21/2019 1855   LDLCALC 224 (H) 06/21/2019 1855   LDLDIRECT 160.9 06/21/2012 0833     Risk Assessment/Calculations:           Physical Exam:    VS:  BP (!) 142/62   Pulse 67   Ht 5' 1.5" (1.562 m)   Wt 174 lb 12.8 oz (79.3 kg)   SpO2 97%   BMI 32.49 kg/m        Wt Readings from Last 3 Encounters:  06/02/22 174 lb 12.8 oz (79.3 kg)  03/20/22 178 lb (80.7 kg)  03/17/22 179 lb (81.2 kg)     GEN:  Well nourished, well developed in no acute distress HEENT: Normal NECK: No JVD; No carotid bruits LYMPHATICS: No lymphadenopathy CARDIAC: RRR, no murmurs, rubs, gallops RESPIRATORY:  Clear to auscultation without rales, wheezing or rhonchi  ABDOMEN: Soft, non-tender, non-distended MUSCULOSKELETAL:  No edema; No deformity  SKIN: Warm and dry NEUROLOGIC:  Alert and oriented x 3 PSYCHIATRIC:  Normal affect   ASSESSMENT:    1. Chest pain, unspecified type   2. Coronary artery disease involving coronary bypass graft of native heart with angina pectoris ([place])   3. Primary hypertension   4. Hyperlipidemia LDL goal <70   5. Controlled type 2 diabetes mellitus without complication, without long-term current use of insulin (HCC)    PLAN:    In order of problems listed above:  Chest pain: She has primarily abdominal pain that radiated to the chest.  She is taking Gas-X, although her symptoms sounds more like abdominal issue for which she is already scheduled to see GI, however her previous angina was also abdominal discomfort as well.  Her current symptom is certainly atypical, I recommend a nuclear stress test.  If she is under significant stress due to her son's poor health.  CAD s/p CABG: Last PCI 2020.  On lifelong aspirin and Effient  Hypertension: Blood pressure stable  Hyperlipidemia: On Praluent  DM2: Managed by primary care  provider.      Shared Decision Making/Informed Consent The risks [chest pain, shortness of breath, cardiac arrhythmias, dizziness, blood pressure fluctuations, myocardial infarction, stroke/transient ischemic attack, nausea, vomiting, allergic reaction, radiation exposure, metallic taste sensation and life-threatening complications (estimated to be 1 in 10,000)], benefits (risk stratification, diagnosing coronary artery disease, treatment guidance) and alternatives of a cardiac PET stress test were discussed in detail with Ms. Renegar and she agrees to proceed.    Medication Adjustments/Labs and Tests Ordered: Current medicines are reviewed at length with the patient today.  Concerns regarding medicines are outlined above.  Orders Placed This Encounter  Procedures   Cardiac Stress Test: Informed Consent Details: Physician/Practitioner Attestation; Transcribe to consent form and obtain patient signature   MYOCARDIAL PERFUSION IMAGING   EKG 12-Lead   No orders of the defined types were placed in this encounter.   Patient Instructions  Medication Instructions:  Your physician recommends that you continue on your current medications as directed. Please refer to the Current Medication list given to you today.  *If you need a refill on your cardiac medications before your next appointment, please call your pharmacy*   Lab Work: NONE ordered at this time of appointment  If you have labs (blood work) drawn today and your tests are completely normal, you will receive your results only by: [place] (if you have MyChart) OR A paper copy in the mail If you have any lab  test that is abnormal or we need to change your treatment, we will call you to review the results.   Testing/Procedures: Your physician has requested that you have a lexiscan myoview. For further information please visit [email or web address]. Please follow instruction sheet, as given.   The test will take approximately  3 to 4 hours to complete; you may bring reading material.  If someone comes with you to your appointment, they will need to remain in the main lobby due to limited space in the testing area.  How to prepare for your Myocardial Perfusion Test: Do not eat or drink 3 hours prior to your test, except you may have water. Do not consume products containing caffeine (regular or decaffeinated) 12 hours prior to your test. (ex: coffee, chocolate, sodas, tea). Do bring a list of your current medications with you.  If not listed below, you may take your medications as normal. Do wear comfortable clothes (no dresses or overalls) and walking shoes, tennis shoes preferred (No heels or open toe shoes are allowed). Do NOT wear cologne, perfume, aftershave, or lotions (deodorant is allowed). If these instructions are not followed, your test will have to be rescheduled.    Follow-Up: At [facility], you and your health needs are our priority.  As part of our continuing mission to provide you with exceptional heart care, we have created designated Provider Care Teams.  These Care Teams include your primary Cardiologist (physician) and Advanced Practice Providers (APPs -  Physician Assistants and Nurse Practitioners) who all work together to provide you with the care you need, when you need it.  We recommend signing up for the patient portal called "MyChart".  Sign up information is provided on this After Visit Summary.  MyChart is used to connect with patients for Virtual Visits (Telemedicine).  Patients are able to view lab/test results, encounter notes, upcoming appointments, etc.  Non-urgent messages can be sent to your provider as well.   To learn more about what you can do with MyChart, go to [email or web address].    Other Instructions Please keep upcoming follow up   Important Information About Sugar         Hilbert Corrigan, [place]  06/04/2022 10:53 PM    [place]

## 2022-06-02 NOTE — Patient Instructions (Addendum)
Medication Instructions:  Your physician recommends that you continue on your current medications as directed. Please refer to the Current Medication list given to you today.  *If you need a refill on your cardiac medications before your next appointment, please call your pharmacy*   Lab Work: NONE ordered at this time of appointment  If you have labs (blood work) drawn today and your tests are completely normal, you will receive your results only by: [place] (if you have MyChart) OR A paper copy in the mail If you have any lab test that is abnormal or we need to change your treatment, we will call you to review the results.   Testing/Procedures: Your physician has requested that you have a lexiscan myoview. For further information please visit [email or web address]. Please follow instruction sheet, as given.   The test will take approximately 3 to 4 hours to complete; you may bring reading material.  If someone comes with you to your appointment, they will need to remain in the main lobby due to limited space in the testing area.  How to prepare for your Myocardial Perfusion Test: Do not eat or drink 3 hours prior to your test, except you may have water. Do not consume products containing caffeine (regular or decaffeinated) 12 hours prior to your test. (ex: coffee, chocolate, sodas, tea). Do bring a list of your current medications with you.  If not listed below, you may take your medications as normal. Do wear comfortable clothes (no dresses or overalls) and walking shoes, tennis shoes preferred (No heels or open toe shoes are allowed). Do NOT wear cologne, perfume, aftershave, or lotions (deodorant is allowed). If these instructions are not followed, your test will have to be rescheduled.    Follow-Up: At [facility], you and your health needs are our priority.  As part of our continuing mission to provide you with exceptional heart care, we have created designated  Provider Care Teams.  These Care Teams include your primary Cardiologist (physician) and Advanced Practice Providers (APPs -  Physician Assistants and Nurse Practitioners) who all work together to provide you with the care you need, when you need it.  We recommend signing up for the patient portal called "MyChart".  Sign up information is provided on this After Visit Summary.  MyChart is used to connect with patients for Virtual Visits (Telemedicine).  Patients are able to view lab/test results, encounter notes, upcoming appointments, etc.  Non-urgent messages can be sent to your provider as well.   To learn more about what you can do with MyChart, go to [email or web address].    Other Instructions Please keep upcoming follow up   Important Information About Sugar

## 2022-06-03 ENCOUNTER — Telehealth (HOSPITAL_COMMUNITY): Payer: Self-pay | Admitting: *Deleted

## 2022-06-03 NOTE — Telephone Encounter (Signed)
Patient given detailed instructions per Myocardial Perfusion Study Information Sheet for the test on 06/08/2022 at 8:15. Patient notified to arrive 15 minutes early and that it is imperative to arrive on time for appointment to keep from having the test rescheduled.  If you need to cancel or reschedule your appointment, please call the office within 24 hours of your appointment. . Patient verbalized understanding.Veronia Beets

## 2022-06-04 ENCOUNTER — Encounter: Payer: Self-pay | Admitting: Physician Assistant

## 2022-06-08 ENCOUNTER — Ambulatory Visit (HOSPITAL_COMMUNITY): Payer: Medicare Other | Attending: Physician Assistant

## 2022-06-08 DIAGNOSIS — R079 Chest pain, unspecified: Secondary | ICD-10-CM | POA: Insufficient documentation

## 2022-06-08 LAB — MYOCARDIAL PERFUSION IMAGING
LV dias vol: 78 mL (ref 46–106)
LV sys vol: 27 mL
Nuc Stress EF: 66 %
Peak HR: 80 {beats}/min
Rest HR: 63 {beats}/min
Rest Nuclear Isotope Dose: 10.5 mCi
SDS: 3
SRS: 1
SSS: 4
ST Depression (mm): 0 mm
Stress Nuclear Isotope Dose: 32.9 mCi
TID: 1.04

## 2022-06-08 MED ORDER — TECHNETIUM TC 99M TETROFOSMIN IV KIT
32.9000 | PACK | Freq: Once | INTRAVENOUS | Status: AC | PRN
  Administered 2022-06-08: 32.9 via INTRAVENOUS

## 2022-06-08 MED ORDER — TECHNETIUM TC 99M TETROFOSMIN IV KIT
10.5000 | PACK | Freq: Once | INTRAVENOUS | Status: AC | PRN
  Administered 2022-06-08: 10.5 via INTRAVENOUS

## 2022-06-08 MED ORDER — REGADENOSON 0.4 MG/5ML IV SOLN
0.4000 mg | Freq: Once | INTRAVENOUS | Status: AC
  Administered 2022-06-08: 0.4 mg via INTRAVENOUS

## 2022-06-15 ENCOUNTER — Telehealth: Payer: Self-pay | Admitting: Cardiovascular Disease

## 2022-06-15 NOTE — Telephone Encounter (Signed)
Spoke to patient 10/5 lexiscan results given.

## 2022-06-15 NOTE — Telephone Encounter (Signed)
Patient is requesting to discuss stress test results.

## 2022-06-30 ENCOUNTER — Other Ambulatory Visit: Payer: Self-pay

## 2022-06-30 ENCOUNTER — Emergency Department (HOSPITAL_BASED_OUTPATIENT_CLINIC_OR_DEPARTMENT_OTHER)
Admission: EM | Admit: 2022-06-30 | Discharge: 2022-06-30 | Disposition: A | Discharge status: Home / Self Care | Payer: Medicare Other | Attending: Emergency Medicine | Admitting: Emergency Medicine

## 2022-06-30 ENCOUNTER — Emergency Department (HOSPITAL_BASED_OUTPATIENT_CLINIC_OR_DEPARTMENT_OTHER): Payer: Medicare Other

## 2022-06-30 DIAGNOSIS — R1032 Left lower quadrant pain: Secondary | ICD-10-CM | POA: Diagnosis not present

## 2022-06-30 DIAGNOSIS — Z7982 Long term (current) use of aspirin: Secondary | ICD-10-CM | POA: Diagnosis not present

## 2022-06-30 DIAGNOSIS — Z794 Long term (current) use of insulin: Secondary | ICD-10-CM | POA: Diagnosis not present

## 2022-06-30 DIAGNOSIS — Z91040 Latex allergy status: Secondary | ICD-10-CM | POA: Diagnosis not present

## 2022-06-30 DIAGNOSIS — E119 Type 2 diabetes mellitus without complications: Secondary | ICD-10-CM | POA: Diagnosis not present

## 2022-06-30 DIAGNOSIS — I2510 Atherosclerotic heart disease of native coronary artery without angina pectoris: Secondary | ICD-10-CM | POA: Insufficient documentation

## 2022-06-30 DIAGNOSIS — Z79899 Other long term (current) drug therapy: Secondary | ICD-10-CM | POA: Diagnosis not present

## 2022-06-30 DIAGNOSIS — R109 Unspecified abdominal pain: Secondary | ICD-10-CM | POA: Diagnosis present

## 2022-06-30 DIAGNOSIS — I10 Essential (primary) hypertension: Secondary | ICD-10-CM | POA: Insufficient documentation

## 2022-06-30 LAB — COMPREHENSIVE METABOLIC PANEL
ALT: 13 U/L (ref 0–44)
AST: 17 U/L (ref 15–41)
Albumin: 4 g/dL (ref 3.5–5.0)
Alkaline Phosphatase: 62 U/L (ref 38–126)
Anion gap: 8 (ref 5–15)
BUN: 16 mg/dL (ref 8–23)
CO2: 25 mmol/L (ref 22–32)
Calcium: 9.2 mg/dL (ref 8.9–10.3)
Chloride: 103 mmol/L (ref 98–111)
Creatinine, Ser: 0.7 mg/dL (ref 0.44–1.00)
GFR, Estimated: >60 mL/min (ref >60)
Glucose, Bld: 135 mg/dL — ABNORMAL HIGH (ref 70–99)
Potassium: 3.8 mmol/L (ref 3.5–5.1)
Sodium: 136 mmol/L (ref 135–145)
Total Bilirubin: 0.9 mg/dL (ref 0.3–1.2)
Total Protein: 7.7 g/dL (ref 6.5–8.1)

## 2022-06-30 LAB — URINALYSIS, ROUTINE W REFLEX MICROSCOPIC
Bilirubin Urine: NEGATIVE
Glucose, UA: NEGATIVE mg/dL
Ketones, ur: NEGATIVE mg/dL
Nitrite: NEGATIVE
Protein, ur: NEGATIVE mg/dL
Specific Gravity, Urine: 1.01 (ref 1.005–1.030)
pH: 6.5 (ref 5.0–8.0)

## 2022-06-30 LAB — CBC
HCT: 44.4 % (ref 36.0–46.0)
Hemoglobin: 14.7 g/dL (ref 12.0–15.0)
MCH: 29.2 pg (ref 26.0–34.0)
MCHC: 33.1 g/dL (ref 30.0–36.0)
MCV: 88.3 fL (ref 80.0–100.0)
Platelets: 304 10*3/uL (ref 150–400)
RBC: 5.03 MIL/uL (ref 3.87–5.11)
RDW: 13.2 % (ref 11.5–15.5)
WBC: 9.5 10*3/uL (ref 4.0–10.5)
nRBC: 0 % (ref 0.0–0.2)

## 2022-06-30 LAB — URINALYSIS, MICROSCOPIC (REFLEX): RBC / HPF: NONE SEEN RBC/hpf (ref 0–5)

## 2022-06-30 LAB — LIPASE, BLOOD: Lipase: 30 U/L (ref 11–51)

## 2022-06-30 LAB — LACTIC ACID, PLASMA: Lactic Acid, Venous: 1 mmol/L (ref 0.5–1.9)

## 2022-06-30 LAB — CBG MONITORING, ED: Glucose-Capillary: 127 mg/dL — ABNORMAL HIGH (ref 70–99)

## 2022-06-30 LAB — TROPONIN I (HIGH SENSITIVITY): Troponin I (High Sensitivity): 4 ng/L (ref <18)

## 2022-06-30 MED ORDER — SODIUM CHLORIDE 0.9 % IV BOLUS: 1000.0000 mL | Freq: Once | INTRAVENOUS | Status: DC

## 2022-06-30 NOTE — ED Triage Notes (Signed)
Reported was sent by gastroenterologist due to left sided abdominal pain, history of diverticulitis, states bowel movement today. States recent hospitalization for it and GI bleed.

## 2022-06-30 NOTE — ED Notes (Signed)
Discharge instructions reviewed with patient. Patient verbalizes understanding, no further questions at this time. Medications and follow up information provided. No acute distress noted at time of departure.

## 2022-06-30 NOTE — Discharge Instructions (Addendum)
Your testing today shows diverticulosis but no diverticulitis.  You should follow-up with your gastroenterologist for further evaluation including EGD and colonoscopy.  Return to the ED with worsening abdominal pain, chest pain, shortness of breath, nausea, vomiting, any other concerns

## 2022-06-30 NOTE — ED Provider Notes (Signed)
[facility] EMERGENCY DEPARTMENT Provider Note   CSN: [id number] Arrival date & time: 06/30/22  1248     History  Chief Complaint  Patient presents with   Abdominal Pain    Ruth [name] is a 76 y.o. female.  Patient told to come to the ED for L sided abdominal pain that she has "had for a while" but became more severe yesterday. Pain is constant, nothing makes it better or worse. Associated with diarrhea "that I have to pull out". But denies straining or hard stools. No blood in stools. No fever, chills, nausea, vomiting, pain with urination or blood in stool. Feels similar to previous episodes of diverticulitis. Still has appendix and gall bladder. Pain is worse with palpation and movement. States constant pressure in chest "from gas" which is unchanged. Recently had reassuring stress test.   She states she was supposed to have a stress test when she was hospitalized at [facility] several months ago but was not able to because of rectal bleeding and being "allergic to the preparation".  Her gastroenterologist has recommended another colonoscopy but she does not want to do this.  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 However she states she did see vascular surgery who told her that she did not have significant mesenteric ischemia.   PMHx including DM, HTN, HLD, CAD chronically anticoagulated with Effient/AsA, GERD, IBS, chronic abdominal pain, fatty liver, history of colon polyps   The history is provided by the patient.  Abdominal Pain Associated symptoms: diarrhea and nausea   Associated symptoms: no fever and no vomiting        Home Medications Prior to Admission medications   Medication Sig Start Date End Date Taking? Authorizing Provider  amLODipine (NORVASC) 2.5 MG tablet Take 2.5 mg by mouth every evening. 05/26/22   [provider]  amLODipine (NORVASC) 5 MG tablet Take 5 mg twice a day  01/23/22   Troy Sine, MD  aspirin EC 81 MG tablet Take 81 mg by mouth daily.    [provider]  BD PEN NEEDLE NANO 2ND GEN 32G X 4 MM MISC USE THREE TIMES DAILY AS DIRECTED WITH HUMALOG AND TOUJEO 06/04/20   [provider]  calcium carbonate (TUMS - DOSED IN MG ELEMENTAL CALCIUM) 500 MG chewable tablet Chew 1-2 tablets by mouth 3 (three) times daily as needed for heartburn.     [provider]  cholecalciferol (VITAMIN D) 1000 UNITS tablet Take 1,000 Units by mouth daily.    [provider]  CRANBERRY-VITAMIN C PO Take 1 tablet by mouth daily.    [provider]  EPINEPHrine 0.3 mg/0.3 mL IJ SOAJ injection Inject 0.3 mg into the muscle as needed. 10/05/19   [provider]  HUMALOG KWIKPEN 100 UNIT/ML KwikPen Inject 5 Units into the skin in the morning and at bedtime. 06/17/20   [provider]  Insulin Glargine (TOUJEO MAX SOLOSTAR [place]) Inject 50 Units into the skin daily.     [provider]  Lancets (ONETOUCH DELICA PLUS [id number]) [place] USE TO [place] DAILY 06/03/20   [provider]  metoprolol tartrate (LOPRESSOR) 100 MG tablet TAKE 1 TABLET(100 MG) BY MOUTH TWICE DAILY Patient taking differently: Take 100 mg by mouth 2 (two) times daily. 03/07/20   Troy Sine, MD  nitrofurantoin (MACRODANTIN) 100 MG capsule Take 100 mg by mouth daily. 11/21/21   [provider]  nitroGLYCERIN (NITROSTAT) 0.4 MG SL tablet Place 1 tablet (0.4 mg total) under the tongue every 5 (five) minutes as needed for chest pain (up to 3 doses). 08/21/19   Lendon Colonel, NP  [facility] VERIO test strip  07/23/20   [provider]  PRALUENT 75 MG/ML SOAJ ADMINISTER 1 ML UNDER THE SKIN EVERY 14 DAYS 09/09/21   Troy Sine, MD  prasugrel (EFFIENT) 10 MG TABS tablet TAKE 1 TABLET(10 MG) BY MOUTH DAILY 12/11/21   Troy Sine, MD  Probiotic Product (PROBIOTIC-10 PO) Take 1 capsule by mouth daily.     [provider]  valsartan (DIOVAN) 160 MG tablet Take 1 tablet (160 mg total) by mouth 2 (two) times daily. TAKE 1 TABLET(160 MG) BY MOUTH TWICE DAILY Strength: 160 mg 03/20/22   Deberah Pelton, NP  vitamin B-12 (CYANOCOBALAMIN) 1000 MCG tablet Take 1,000 mcg by mouth daily.    [provider]      Allergies    Betadine [povidone iodine], Codeine, Demerol, Iohexol, Latex, Other, Percocet [oxycodone-acetaminophen], Plavix [clopidogrel bisulfate], Red dye, Shellfish allergy, Sulfonamide derivatives, Tylenol [acetaminophen], Azilsartan, Azithromycin, Barbiturates, Cardizem [diltiazem], Ciprofloxacin, Clonidine, Crestor [rosuvastatin], Ezetimibe, Fluogen [influenza virus vaccine], Furosemide, Hydralazine hcl, Hydrochlorothiazide, Miralax [polyethylene glycol], Omega-3-acid ethyl esters, Pravastatin sodium, Spironolactone, Statins, Sulfa antibiotics, Vancomycin, Warfarin, and Imdur [isosorbide nitrate]    Review of Systems   Review of Systems  Constitutional:  Positive for activity change and appetite change. Negative for fever.  HENT:  Negative for congestion.   Gastrointestinal:  Positive for abdominal pain, diarrhea and nausea. Negative for vomiting.  Musculoskeletal:  Negative for arthralgias and myalgias.  Skin:  Negative for rash.  Neurological:  Negative for weakness and headaches.   all other systems are negative except as noted in the HPI and PMH.    Physical Exam Updated Vital Signs BP (!) 176/85   Pulse 67   Temp 97.8 F (36.6 C)   Resp 20   Ht 5' 0.5" (1.537 m)   Wt 79.4 kg   SpO2 100%   BMI 33.61 kg/m  Physical Exam Vitals and nursing note reviewed.  Constitutional:      General: She is not in acute distress.    Appearance: She is well-developed. She is not ill-appearing.  HENT:     Head: Normocephalic and atraumatic.     Mouth/Throat:     Pharynx: No oropharyngeal exudate.  Eyes:     Conjunctiva/sclera: Conjunctivae normal.     Pupils: Pupils are  equal, round, and reactive to light.  Neck:     Comments: No meningismus. Cardiovascular:     Rate and Rhythm: Normal rate and regular rhythm.     Heart sounds: Normal heart sounds. No murmur heard. Pulmonary:     Effort: Pulmonary effort is normal. No respiratory distress.     Breath sounds: Normal breath sounds.  Abdominal:     Palpations: Abdomen is soft.     Tenderness: There is abdominal tenderness. There is no guarding or rebound.     Comments: TTP LUQ and LLQ. No guarding or rebound  Musculoskeletal:        General: No tenderness. Normal range of motion.     Cervical back: Normal range of motion and neck supple.     Comments: No CVAT  Skin:    General: Skin is warm.  Neurological:     Mental Status: She is alert and oriented to person, place, and time.     Cranial Nerves: No cranial  nerve deficit.     Motor: No abnormal muscle tone.     Coordination: Coordination normal.     Comments:  5/5 strength throughout. CN 2-12 intact.Equal grip strength.   Psychiatric:        Behavior: Behavior normal.     ED Results / Procedures / Treatments   Labs (all labs ordered are listed, but only abnormal results are displayed) Labs Reviewed  COMPREHENSIVE METABOLIC PANEL - Abnormal; Notable for the following components:      Result Value   Glucose, Bld 135 (*)    All other components within normal limits  URINALYSIS, ROUTINE W REFLEX MICROSCOPIC - Abnormal; Notable for the following components:   APPearance HAZY (*)    Hgb urine dipstick TRACE (*)    Leukocytes,Ua SMALL (*)    All other components within normal limits  URINALYSIS, MICROSCOPIC (REFLEX) - Abnormal; Notable for the following components:   Bacteria, UA FEW (*)    All other components within normal limits  CBG MONITORING, ED - Abnormal; Notable for the following components:   Glucose-Capillary 127 (*)    All other components within normal limits  URINE CULTURE  LIPASE, BLOOD  CBC  LACTIC ACID, PLASMA  LACTIC  ACID, PLASMA  TROPONIN I (HIGH SENSITIVITY)  TROPONIN I (HIGH SENSITIVITY)    EKG EKG Interpretation  Date/Time:  Tuesday June 30 2022 16:09:40 EDT Ventricular Rate:  60 PR Interval:  147 QRS Duration: 98 QT Interval:  461 QTC Calculation: 461 R Axis:   20 Text Interpretation: Sinus rhythm Nonspecific repol abnormality, diffuse leads No significant change was found Confirmed by Ezequiel Essex [phone]) on 06/30/2022 4:17:41 PM  Radiology CT ABDOMEN PELVIS WO CONTRAST  Result Date: 06/30/2022 CLINICAL DATA:  Abdominal pain EXAM: CT ABDOMEN AND PELVIS WITHOUT CONTRAST TECHNIQUE: Multidetector CT imaging of the abdomen and pelvis was performed following the standard protocol without IV contrast. RADIATION DOSE REDUCTION: This exam was performed according to the departmental dose-optimization program which includes automated exposure control, adjustment of the mA and/or kV according to patient size and/or use of iterative reconstruction technique. COMPARISON:  02/28/2022 FINDINGS: Lower chest: No acute abnormality. Coronary artery calcifications. Scarring and or atelectasis of the bilateral lung bases. Hepatobiliary: No solid liver abnormality is seen. No gallstones, gallbladder wall thickening, or biliary dilatation. Pancreas: Unremarkable. No pancreatic ductal dilatation or surrounding inflammatory changes. Spleen: Normal in size without significant abnormality. Adrenals/Urinary Tract: Adrenal glands are unremarkable. Kidneys are normal, without renal calculi, solid lesion, or hydronephrosis. Bladder is unremarkable. Stomach/Bowel: Stomach is within normal limits. Appendix appears normal. No evidence of bowel wall thickening, distention, or inflammatory changes. Descending and sigmoid diverticulosis. Vascular/Lymphatic: Aortic atherosclerosis. No enlarged abdominal or pelvic lymph nodes. Reproductive: Status post hysterectomy. Other: No abdominal wall hernia or abnormality. No ascites.  Musculoskeletal: No acute or significant osseous findings. IMPRESSION: 1. No acute noncontrast CT findings of the abdomen or pelvis to explain abdominal pain. 2. Descending and sigmoid diverticulosis without evidence of acute diverticulitis. 3. Status post hysterectomy. 4. Coronary artery disease. Aortic Atherosclerosis (ICD10-I70.0). Electronically Signed   By: Delanna Ahmadi M.D.   On: 06/30/2022 15:28    Procedures Procedures    Medications Ordered in ED Medications  sodium chloride 0.9 % bolus 1,000 mL (has no administration in time range)    ED Course/ Medical Decision Making/ A&P                           Medical Decision  Making Amount and/or Complexity of Data Reviewed Labs: ordered. Decision-making details documented in ED Course. Radiology: ordered and independent interpretation performed. Decision-making details documented in ED Course. ECG/medicine tests: ordered and independent interpretation performed. Decision-making details documented in ED Course.   L sided abdominal pain similar to previous episodes of diverticulitis. Vitals stable. Abdomen soft without peritoneal signs   LFTs and lipase normal.  WBC normal.   Patient with multiple allergies and states that she cannot take anything for pain or nausea as she is "allergic to everything".   Allergic to IV contrast as well but requesting CT scan to assess for diverticulitis.   Does have pyuria without urinary symptoms. Will send culture. Patient declines IVF, pain and nausea meds.   CT shows diverticulosis without diverticulitis or other acute abnormality. Results reviewed and interpreted by me.   Work-up reassuring.  Abdomen soft without peritoneal signs.  Patient to follow back up with her gastroenterologist for further assessment of her abdominal pain.  Per gastroenterology note she does have some chronic abdominal pain.  She has declined further work-up including colonoscopy  Lactate is normal.  Low suspicion for  acute mesenteric ischemia today.  Abdomen soft without peritoneal signs.  Troponin negative.  EKG unchanged.  Recent stress test 3 weeks ago was reassuring. Low suspicion for ACS.  Patient to follow-up with her gastroenterologist for further evaluation including EGD and colonoscopy.  She is hesitant to get the studies done but knows that is what she needs.  She is anxious to go home. She is relieved that she does not have diverticulitis.  She will follow-up with the ED as needed.        Final Clinical Impression(s) / ED Diagnoses Final diagnoses:  Left lower quadrant abdominal pain    Rx / DC Orders ED Discharge Orders     None         Miranda Frese, Annie Main, MD 06/30/22 1721

## 2022-07-01 ENCOUNTER — Telehealth: Payer: Self-pay | Admitting: Cardiovascular Disease

## 2022-07-01 LAB — URINE CULTURE

## 2022-07-01 NOTE — Telephone Encounter (Signed)
   Pre-operative Risk Assessment    Patient Name: Ruth [name]  DOB: [birth year] MRN: [id number]      Request for Surgical Clearance    Procedure:   colonoscopy and upper scopy    Date of Surgery:  Clearance TBD                                 Surgeon:  Dr. Rolm Bookbinder Surgeon's Group or Practice Name:  [facility]  Phone number:  [phone] Fax number:  [phone]   Type of Clearance Requested:   - Medical  - Pharmacy:  Hold Prasugrel (Effient)     Type of Anesthesia:  General    Additional requests/questions:      Eston Mould   07/01/2022, 4:43 PM

## 2022-07-01 NOTE — Telephone Encounter (Signed)
   Patient Name: Ruth [name]  DOB: 1945/09/21 MRN: [id number]  Primary Cardiologist: [name], MD  Chart reviewed as part of pre-operative protocol coverage.  Ruth [name] will require phone visit for clearance.   Last seen 06/02/22 due to chest pain with subsequent Lexiscan myoview 06/08/22 low risk study. Upcoming visit 07/14/22 with Dr. Claiborne Billings. If she does not anticipate procedure prior to 07/14/22, could address clearance at that time and update appt notes.   Most recent PCI 06/2019. On lifelong Aspirin and Effient (intolerant to Plavix, Brilinta). As > 1 year from DES per office protocol may hold Effient 5 days prior to procedure.   Loel Dubonnet, NP 07/01/2022, 4:56 PM

## 2022-07-02 NOTE — Telephone Encounter (Signed)
I s/w the pt and she first stated she will wait to see DR. [name] for pre op clearance. She then states though don't know if she can wait that long as her stomach is getting bigger. I then offered that we could set up a tele pre op appt as well. Pt states she has to go to another appt right now but she will call back to let us know which way she would like to handle the pre op appt.

## 2022-07-03 NOTE — Telephone Encounter (Signed)
I s/w the pt and confirmed that she is going to wait until she see's Dr. Claiborne Billings as she states Dr. Carleene Overlie cannot get her procedure done for a little bit. Pt thanked me for being so sweet and kind to her. I will add to appt notes for Dr. Claiborne Billings pt needs pre op clearance. Once Dr. Claiborne Billings clears her he will have his nurse/c,a fax clearance notes to Dr. Rolm Bookbinder. Pt said thank you again.

## 2022-07-14 ENCOUNTER — Ambulatory Visit: Payer: Medicare Other | Attending: Cardiovascular Disease | Admitting: Cardiovascular Disease

## 2022-07-14 ENCOUNTER — Encounter: Payer: Self-pay | Admitting: Cardiovascular Disease

## 2022-07-14 DIAGNOSIS — K5790 Diverticulosis of intestine, part unspecified, without perforation or abscess without bleeding: Secondary | ICD-10-CM

## 2022-07-14 DIAGNOSIS — Z789 Other specified health status: Secondary | ICD-10-CM

## 2022-07-14 DIAGNOSIS — E785 Hyperlipidemia, unspecified: Secondary | ICD-10-CM | POA: Diagnosis not present

## 2022-07-14 DIAGNOSIS — Z794 Long term (current) use of insulin: Secondary | ICD-10-CM

## 2022-07-14 DIAGNOSIS — I25810 Atherosclerosis of coronary artery bypass graft(s) without angina pectoris: Secondary | ICD-10-CM | POA: Diagnosis not present

## 2022-07-14 DIAGNOSIS — Z01818 Encounter for other preprocedural examination: Secondary | ICD-10-CM

## 2022-07-14 DIAGNOSIS — I10 Essential (primary) hypertension: Secondary | ICD-10-CM | POA: Diagnosis not present

## 2022-07-14 DIAGNOSIS — E119 Type 2 diabetes mellitus without complications: Secondary | ICD-10-CM

## 2022-07-14 MED ORDER — AMLODIPINE BESYLATE 10 MG PO TABS: ORAL_TABLET | ORAL | 3 refills | Status: DC

## 2022-07-14 NOTE — Patient Instructions (Signed)
Medication Instructions:  START taking Amlodipine 10 mg (two of the 5 mg tablets) in the morning  STOP Effient 7 days prior to procedure. STOP Aspirin 5 days prior to procedure.  *If you need a refill on your cardiac medications before your next   Follow-Up: At [facility], you and your health needs are our priority.  As part of our continuing mission to provide you with exceptional heart care, we have created designated Provider Care Teams.  These Care Teams include your primary Cardiologist (physician) and Advanced Practice Providers (APPs -  Physician Assistants and Nurse Practitioners) who all work together to provide you with the care you need, when you need it.  We recommend signing up for the patient portal called "MyChart".  Sign up information is provided on this After Visit Summary.  MyChart is used to connect with patients for Virtual Visits (Telemedicine).  Patients are able to view lab/test results, encounter notes, upcoming appointments, etc.  Non-urgent messages can be sent to your provider as well.   To learn more about what you can do with MyChart, go to [email or web address].    Your next appointment:   6 month(s)  The format for your next appointment:   In Person  Provider:   [name], MD

## 2022-07-14 NOTE — Progress Notes (Signed)
Patient ID: VORA CLOVER, female   DOB: 04/29/1946, 76 y.o.   MRN: [id number]       Primary M.D.: Dr. Crist Infante  HPI: Ms. Aminata Buffalo is a 76 year-old female who presents for a 6 month follow-up cardiology evaluation.  Ms Marcelli has a history of coronary artery disease.  While living in [place] she developed chest discomfort and in 24-Oct-2004 underwent CABG surgery 4 at the [facility] in [place] by Dr. Marzetta Merino.  She had a LIMA to LAD, SVG to diagonal, SVG to obtuse marginal, and SVG to the RCA.   Her husband died in 10-24-10 and she moved to the [place] area.  In 10/25/2011, she experienced symptoms of increasing shortness of breath.  She underwent cardiac catheterization and a stent was placed in the vein graft to her RCA.  She states that she has not had any subsequent stress testing.  She is a former patient of Dr. Einar Gip and an echocardiogram in July 2014 showed normal LV size and function with mild concentric left ventricular hypertrophy.  She had mild to moderate mitral regurgitation, mild tricuspid regurgitation, and mild primary hypertension with an estimated PA pressure 39 mm.  She has a history of hypertension for at least 15-20 years.  She states her blood pressure at home typically is in the 145-150 range, but her blood pressure is always elevated when she goes to the doctor's office.  She has a history of hyperlipidemia and apparently has been intolerant to statins but has never tried Zetia.  There is a history of diabetes mellitus but is not on therapy and states this is diet controlled.    She admits to  increased stress.  Her mother died in 2014/11/22 and recently her dog died.  She does not routinely exercise.  When I initially saw her in June 2016 she has been taking amlodipine 5 mg, losartan 50 mg twice a day and metoprolol 50 mrem twice a day for hypertension and CAD.  She has a history of multiple allergies.  She states that she is Intolerant to amlodipine  as well as Spironolactone.  She has been having labile blood pressure.  In the past. She became dehydrated on diarrhetic therapy.    An echo Doppler study on 03/13/2015  showed an ejection fraction at 55-60%.  There was mild mitral regurgitation, mild left atrial dilatation, and very mild pulmonary hypertension with an estimated PA pressure 32 mm.  A nuclear perfusion study done on 03/21/2015 was low risk and demonstrated a very small region of mild ischemia in the basal anterolateral wall.  She had normal LV function with an EF of 65%.  She developed recurrent episodes of chest pain in June 2018 which  led to her undergoing definitive cardiac catheterization on 03/01/2017.  She was found to have preserved global LV contractility with an EF of 55-60%.  There was significant multivessel CAD with 70% diffuse proximal LAD stenoses, total occlusion of the circumflex and OM1 proximally, and 75% proximal RCA stenosis with occlusion of the RCA prior to the acute margin.  She had a patent LIMA graft supplying the mid LAD, which may also anastomose into the diagonal vessel, but this was uncertain.  The vein graft supplying the distal marginal circumflex branch had smooth 60% proximal to mid body stenosis with TIMI-3 flow.  The vein graft supplying the distal RCA had focal 60% stenosis followed by 99% thrombotic stenosis beyond a stented segment.  She underwent successful PCI  to this vein graft to the RCA with insertion of a 3.532 mm Synergy stent postdilated to 3.71 mm with a Radiation protection practitioner used for distal graft protection.  It was recommended that she continue to antiplatelet therapy indefinitely.  Ms. Mulroy has significant allergies to multiple medications.  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  She has been on losartan 50 Milligan grams twice a day, metoprolol 100 mg twice a day, and continues to take  aspirin 81 mg and Effient 10 mg.  She did not tolerate Plavix or Brilinta.  She states that she is intolerant to statins.  She has no interest in PCSK9 inhibition.    Since I saw her in October 2018, she was seen in the office at least 5 times by extenders as well as pharmacists.  She is intolerant to numerous medications.  She last saw Nehemiah Massed, [facility] on December 02, 2017 who suggested the possibility of minoxidil but she continued to deny this medication change.  She states most recently her blood pressure at home has been ranging from 517-001 systolically and 74-94 diastolically.  She had called the office stating that she was having pain running through the area where she had a stent.  This was relieved with "water and vinegar which she drinks for gas.   When I saw her in May 2019, her blood pressure continued to be elevated in the office despite taking amlodipine 2.5 mg (she stated she could not tolerate 5 mg), metoprolol 100 mg twice a day, and she was taking valsartan 160 in the morning and only three quarters of a tablet in the evening.  With her blood pressure elevation I suggested further titration of valsartan to 160 twice daily and added Cardura 1 mg initially at night with plans to titrate to 2 mg.  I recommended follow-up hypertensive clinic evaluations with Joslyn Hy as well as Raquel Joellyn Haff who she has seen on numerous occasions.  He continues to admit to intolerance to numerous medications.  I saw her in February 2020 at which time she was feeling significantly better.   She continued to have blood pressure elevation in the office and told me typically blood pressure at home was ranging around 496 systolically.  Ms. Skalicky was admitted to the hospital with recurrent abdominal pain which is her anginal equivalent.  I recommended repeat definitive cardiac catheterization since prior to her last intervention she experienced the same symptoms which improved  following intervention to her vein graft to RCA.  Diagnostic catheterization was done on June 22, 2019 by Dr. Tamala Julian and she again had developed restenosis within the stent in the vein graft to her RCA.  She also had new stenosis of 90% in the vein graft supplying her circumflex marginal vessel.  Her LIMA to LAD and diagonal remain patent.  She had an occluded native mid RCA and circumflex and high-grade 70% proximal LAD with 90% mid LAD stenosis with an occluded second diagonal.  After much discussion we ultimately recommended PCI and the following day she underwent successful intervention with stenting to the vein graft supplying her circumflex marginal vessel and PTCA for the in-stent restenosis in the vein graft supplying her RCA.  Subsequently, her symptoms have improved.  Of note, during her hospitalization on presentation she had marked hyperlipidemia highly suggestive of familial hyperlipidemia with total cholesterol at 300, LDL cholesterol 224 and triglycerides 183.  In the  past she has not been able to tolerate statins or Zetia.  I recommended initiation of PCSK9 therapy.   She had called the office  with complaints of elevated blood pressure.  Amlodipine was increased to 7.5 mg daily.  She was to continue to take and record blood pressure readings daily.  She again called the office on August 18, 2019 with complaints of a sharp epigastric discomfort that she felt was gas but Gas-X only partially improved her symptoms.  She was w evaluated by Bunnie Domino, NP  in the office on August 21, 2019 for follow-up evaluation.  She was having some frequent burping and gas symptoms.  Her blood pressure was elevated at 158/92.  She had undergone prior abdominal ultrasound which was negative for cholelithiasis.  GI evaluation was recommended.   I saw her in a telemedicine visit in February 2021.  At that time she felt improved.  Since initiating Praluent, lipid studies had improved from an LDL of 224   To 123 on September 29, 2019.  I mentioned to her that she was only on the 75 mg dose of Praluent and my recommendation would be to titrate this to the 150 mg dose.  She also continued to have blood pressure lability and was adamant about increasing medications.  I saw her in a telemedicine visit in February 2021.  Her blood pressure continued to be elevated and I recommended she increase amlodipine to 5 mg.  Her LDL cholesterol was improved but remained elevated at 123 and I again recommended increasing Praluent to 150 mg rather than the reduced dose of 75 mg every 2 weeks.  I saw her in November 2021.  Over the prior year she had  issues with diverticulitis and irritable bowel syndrome.  She also has had urinary issues and underwent cystoscopy by Dr. Vikki Ports.  She was in need for Praluent and we had obtained a sample of her next dose.  She denies any recurrent chest pain.  Since I last saw her, she was recently notified by Sande Rives after the patient had called our answering service regarding blood pressure concerns.  She admitted that she gets very anxious and at times her blood pressure increases.  She was encouraged to keep a log of her blood pressure and bring it to her follow-up office visit.  I saw her on Jan 29, 2021.  At that time she told me her blood pressure typically runs around 140/70 but at night it can increase to as high as 220/105.  She states her blood pressure last night was 180/90.  She has numerous medication intolerances.  Currently she has been taking amlodipine 5 mg, hydralazine only on a as needed basis 10 mg, metoprolol tartrate 100 mg twice a day and valsartan 160 mg twice a day.  She was requesting another dose of Praluent to be provided.  I checked with our pharmacist and apparently she has been approved for continued therapy.  She continues to have a lot of anxiety.  She denies chest pain.  During that evaluation, with her blood pressure lability I recommended further  titration of amlodipine to 10 mg up from 5 mg and to take this in the morning and not wait 2 months to take a medication.  Apparently she has been staggering all her medications and was taking these medicines at 2-hour intervals throughout the day.  She continued the metoprolol 100 mg twice a day and had a prescription for hydralazine and continued on valsartan 160  twice a day.  I strongly recommended she increase her dose of Praluent from 75 mg up to the 150 mg dose but she did not want to do this.  Her most recent LDL was still elevated at 108.  She was given a sample of Praluent in the office.  I last saw her on July 30, 2021.  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  Apparently she has not taken Praluent over the last 2 months.  She admits to right shoulder upper back tenderness.  She never increased amlodipine to 10 mg and is taking 7.5 mg daily.  She continues to be on irbesartan 1 and 50 mg, Toprol tartrate 100 mg twice a day valsartan 160 twice a day and is not taking hydralazine.    Since I last saw her, she had laboratory recheck by Dr. Joylene Draft November 17, 2021.  Chemistry was normal.  Creatinine 0.7.  Hemoglobin/hematocrit 14.1/40.2.  Lipid studies showed total cholesterol 153, triglycerides 160, HDL 32, LDL 89.  I last saw her on Jan 23, 2022.  She continues to have multiple drug intolerances.  She is no longer taking hydralazine daily but has this on a as needed basis.  She has been taking amlodipine 7.5 mg and a 5 mg and 2.5 regimen which she takes 2 hours apart.  She is no longer taking Lindzen's.  She continues to be on metoprolol tartrate 100 mg twice a day, valsartan 160 mg twice a day, prasugrel 10 mg daily, and is now back on low-dose Praluent at 75 mcg/mL.  She has had difficulty with diverticular disease.  She has had significant abdominal bloating.  She takes Gas-X regularly.  She denied  any exertional chest tightness.    Since I last saw her, she was evaluated by Coletta Memos, NP on March 20, 2022 and most recently by Desiree Hane on June 02, 2022.  She has had issues with significant abdominal bloating and has suspected mesenteric ischemia.  She had been under significant increased stress from her son's poor health and was told that his cardiac function is less than 20%.  It was recommended that she undergo nuclear stress imaging for assessment.  Myocardial perfusion study was done on June 08, 2022 which was a low risk study and compared to a prior study in 2018 a large reversible inferolateral defect had been previously noted had resolved.  There was now only of minimal sized fixed basal to mid inferolateral perfusion defect suggestive of artifact.  Presently, she is in need to undergo clearance for colonoscopy.  She states her blood pressure in the morning typically at home is 130/52 but at night is 150/72.  She has continued to be on amlodipine 7.5 mg daily, metoprolol tartrate 100 mg twice a day and valsartan 160 mg twice a day.  She is on Praluent 75 mg every 14 days.  She is diabetic on Humulin insulin.  With Gas-X her abdominal bloating has improved.  She has been maintained on prasugrel 10 mg for antiplatelet therapy.    Past Medical History:  Diagnosis Date   Anxiety    Aortic atherosclerosis (HCC)    CAD (coronary artery disease)    a. CABG x 4 in 2006 in [place] (LIMA->LAD, VG->Diag, VG->OM, VG->RCA); b. DES to midbody of SVG-PDA/PLA 07/2012; c. 03/2015 Myoview: EF 65%, small defect of mild severit in basal inferolateral wall, low risk.   Complication  of anesthesia    "quit breathing when I got ?[place]" (07/28/2012)   Coronary atherosclerosis    Depression    Diverticulitis    Diverticulosis    Dyslipidemia    Intolerant of statins   Fecal incontinence    GERD (gastroesophageal reflux disease)    Hyperlipidemia    Hyperplastic colon polyp    IBS  (irritable bowel syndrome)    Labile hypertension    a. 09/2016 Renal Artery duplex: nl renal arteries.   Migraines    "had them in my 29's" (07/28/2012)   Multiple allergies    Obesity    Steatohepatitis    Tubular adenoma of colon    Type II diabetes mellitus ([place])    a. no meds, diet controlled - takes cinnamon.    Past Surgical History:  Procedure Laterality Date   ABDOMINAL HYSTERECTOMY  1970's   BREAST LUMPECTOMY     bilateral   CARDIAC CATHETERIZATION  2006   CORONARY ANGIOPLASTY WITH STENT PLACEMENT  07/28/2012   "1; first time for me" (07/28/2012)   CORONARY ARTERY BYPASS GRAFT  2006   CABG X4   CORONARY STENT INTERVENTION N/A 03/01/2017   Procedure: Coronary Stent Intervention;  Surgeon: Troy Sine, MD;  Location: [facility];  Service: Cardiovascular;  Laterality: N/A;   CORONARY STENT INTERVENTION N/A 06/23/2019   Procedure: CORONARY STENT INTERVENTION;  Surgeon: Martinique, Peter M, MD;  Location: [facility];  Service: Cardiovascular;  Laterality: N/A;   EXCISIONAL HEMORRHOIDECTOMY  1970's   INGUINAL HERNIA REPAIR  1970's?   left   LEFT HEART CATH AND CORS/GRAFTS ANGIOGRAPHY N/A 03/01/2017   Procedure: Left Heart Cath and Cors/Grafts Angiography;  Surgeon: Troy Sine, MD;  Location: [facility];  Service: Cardiovascular;  Laterality: N/A;   LEFT HEART CATH AND CORS/GRAFTS ANGIOGRAPHY N/A 06/22/2019   Procedure: LEFT HEART CATH AND CORS/GRAFTS ANGIOGRAPHY;  Surgeon: Belva Crome, MD;  Location: [facility];  Service: Cardiovascular;  Laterality: N/A;   LEFT HEART CATHETERIZATION WITH CORONARY/GRAFT ANGIOGRAM N/A 07/28/2012   Procedure: LEFT HEART CATHETERIZATION WITH Beatrix Fetters;  Surgeon: Burnell Blanks, MD;  Location: [facility] C[facility] LAB;  Service: Cardiovascular;  Laterality: N/A;   PERCUTANEOUS CORONARY STENT INTERVENTION (PCI-S)  07/28/2012   Procedure: PERCUTANEOUS CORONARY STENT INTERVENTION (PCI-S);  Surgeon:  Burnell Blanks, MD;  Location: [facility] LAB;  Service: Cardiovascular;;   TONSILLECTOMY AND ADENOIDECTOMY  ~ 1951    Allergies  Allergen Reactions   Betadine [Povidone Iodine] Shortness Of Breath and Swelling   Codeine Anaphylaxis and Shortness Of Breath    "quit breathing" (07/28/2012) Tolerates 1-2 shots of morphine   Demerol Anaphylaxis    "quit breathing" (07/28/2012)   Iohexol Anaphylaxis    Finger/ankle swelling   Latex Anaphylaxis    "quit breathing" (07/28/2012)   Other Anaphylaxis    Perfume, Any Fragrance. Cleaning Fluids.  "quit breathing" (07/28/2012)   Percocet [Oxycodone-Acetaminophen] Anaphylaxis    "quit breathing; disoriented" (07/28/2012)   Plavix [Clopidogrel Bisulfate] Anaphylaxis    "get hot; like I'm burning up inside; had to put me in shower after OHS because of that" (07/28/2012)   Red Dye Anaphylaxis    "quit breathing" (07/28/2012)   Shellfish Allergy Shortness Of Breath    "broke out in knots all over" (07/28/2012)   Sulfonamide Derivatives Shortness Of Breath and Rash    "quit breathing" (07/28/2012)   Tylenol [Acetaminophen] Shortness Of Breath and Itching   Azilsartan  Other reaction(s): muscle pain   Azithromycin     Other reaction(s): she cannot take it. felt like she would die. legs would not work , painful and had to get in the shower and had diarrhea.   Barbiturates     Other reaction(s): SOB   Cardizem [Diltiazem]     Other reaction(s): took one dose.  felt very sick, bad. said raised bp more.   Ciprofloxacin     Other reaction(s): after one does had charlie horses all over her legs and could not go back to sleep   Clonidine     Other reaction(s): dizziness   Crestor [Rosuvastatin]     Other reaction(s): RASH   Ezetimibe     Other reaction(s): RASH   Fluogen [Influenza Virus Vaccine]     Other reaction(s): gets very sick and thinks it is the mercury level.   Furosemide     Other reaction(s): makes her too tired.    Hydralazine Hcl     Other reaction(s): diarrhea   Hydrochlorothiazide     Other reaction(s): broke out with blisters on her mouth   Miralax [Polyethylene Glycol] Other (See Comments)    GI upset severe   Omega-3-Acid Ethyl Esters     Other reaction(s): bad diarrhea   Pravastatin Sodium Other (See Comments)    Leg pain, problems ambulating    Spironolactone     Other reaction(s): states she doesn't want to take it as it made her dehydrated in the past.   Statins     Muscle pain   Sulfa Antibiotics     Other reaction(s): swelling/SOB   Vancomycin     Other reaction(s): pt states it caused her major abd pain   Warfarin     Other reaction(s): CAN'T TAKE, GETS HOT/BREAKS OUT Other reaction(s): CAN'T TAKE, GETS HOT/BREAKS OUT   Imdur [Isosorbide Nitrate] Other (See Comments)    Intolerance. "Can't remember the reaction."    Current Outpatient Medications  Medication Sig Dispense Refill   aspirin EC 81 MG tablet Take 81 mg by mouth daily.     BD PEN NEEDLE NANO 2ND GEN 32G X 4 MM MISC USE THREE TIMES DAILY AS DIRECTED WITH HUMALOG AND TOUJEO     calcium carbonate (TUMS - DOSED IN MG ELEMENTAL CALCIUM) 500 MG chewable tablet Chew 1-2 tablets by mouth 3 (three) times daily as needed for heartburn.      cholecalciferol (VITAMIN D) 1000 UNITS tablet Take 1,000 Units by mouth daily.     CRANBERRY-VITAMIN C PO Take 1 tablet by mouth daily.     EPINEPHrine 0.3 mg/0.3 mL IJ SOAJ injection Inject 0.3 mg into the muscle as needed.     HUMALOG KWIKPEN 100 UNIT/ML KwikPen Inject 5 Units into the skin in the morning and at bedtime.     Insulin Glargine (TOUJEO MAX SOLOSTAR [place]) Inject 50 Units into the skin daily.      insulin glargine, 1 Unit Dial, (TOUJEO SOLOSTAR) 300 UNIT/ML Solostar Pen Inject 50 Units into the skin daily. At lunch time.     Lancets (ONETOUCH DELICA PLUS [id number]) MISC USE TO CHECK BLOOD SUGAR FOUR TIMES DAILY     metoprolol tartrate (LOPRESSOR) 100 MG tablet TAKE 1 TABLET(100  MG) BY MOUTH TWICE DAILY (Patient taking differently: Take 100 mg by mouth 2 (two) times daily.) 180 tablet 1   nitrofurantoin (MACRODANTIN) 100 MG capsule Take 100 mg by mouth daily.     nitroGLYCERIN (NITROSTAT) 0.4 MG SL tablet Place 1 tablet (  0.4 mg total) under the tongue every 5 (five) minutes as needed for chest pain (up to 3 doses). 25 tablet 2   ONETOUCH VERIO test strip      PRALUENT 75 MG/ML SOAJ ADMINISTER 1 ML UNDER THE SKIN EVERY 14 DAYS 2 mL 11   prasugrel (EFFIENT) 10 MG TABS tablet TAKE 1 TABLET(10 MG) BY MOUTH DAILY 90 tablet 3   Probiotic Product (PROBIOTIC-10 PO) Take 1 capsule by mouth daily.     valsartan (DIOVAN) 160 MG tablet Take 1 tablet (160 mg total) by mouth 2 (two) times daily. TAKE 1 TABLET(160 MG) BY MOUTH TWICE DAILY Strength: 160 mg 180 tablet 3   vitamin B-12 (CYANOCOBALAMIN) 1000 MCG tablet Take 1,000 mcg by mouth daily.     amLODipine (NORVASC) 10 MG tablet Take 1 tablet (10 mg) by mouth every morning. 90 tablet 3   CREON 36000-114000 units CPEP capsule Take 36,000 Units by mouth.     No current facility-administered medications for this visit.    Social History   Socioeconomic History   Marital status: Widowed    Spouse name: Not on file   Number of children: 2   Years of education: Not on file   Highest education level: Not on file  Occupational History   Occupation: retired  Tobacco Use   Smoking status: Never   Smokeless tobacco: Never  Vaping Use   Vaping Use: Never used  Substance and Sexual Activity   Alcohol use: No    Alcohol/week: 0.0 standard drinks of alcohol   Drug use: No   Sexual activity: Never  Other Topics Concern   Not on file  Social History Narrative   Not on file   Social Determinants of Health   Financial Resource Strain: Not on file  Food Insecurity: Not on file  Transportation Needs: Not on file  Physical Activity: Not on file  Stress: Not on file  Social Connections: Not on file  Intimate Partner Violence:  Not on file   Social history is notable that she is widowed.  She has 2 children who are 5 years apart in their 30s there is no tobacco use.  She does not routinely exercise. She has a son who had undergone CABG surgery and reportedly has a defibrillator and an ejection fraction of 35%.  Family History  Problem Relation Age of Onset   Coronary artery disease Father    Colon cancer Father    Colon polyps Father    Heart disease Father    Stroke Mother    Hypertension Other    Breast cancer Other        grandmother   Diabetes Maternal Grandmother    Diabetes Paternal Grandmother    Esophageal cancer Neg Hx    Rectal cancer Neg Hx    Stomach cancer Neg Hx    Family history is notable in that her father has heart disease and is 72 years old.  Her mother died at age 63 with a stroke.  She has 2 sisters, ages 73 and 84.  ROS General: Negative; No fevers, chills, or night sweats HEENT: Negative; No changes in vision or hearing, sinus congestion, difficulty swallowing Pulmonary: Negative; No cough, wheezing, shortness of breath, hemoptysis Cardiovascular: See HPI:  GI: Positive for left upper quadrant pain which he describes as "gas."  She has issues with diverticulitis intermittently and irritable bowel syndrome.  Abdominal bloating GU: Has had some urinary issues and will be undergoing a cystoscopy Musculoskeletal: Negative; no  myalgias, joint pain, or weakness Hematologic: Negative; no easy bruising, bleeding Endocrine: Positive for diabetes mellitus , which is untreated.  She has refused medications Neuro: Negative; no changes in balance, headaches Skin: Negative; No rashes or skin lesions Psychiatric: Anxiety Sleep: Negative; No snoring,  daytime sleepiness, hypersomnolence, bruxism, restless legs, hypnogognic hallucinations. Other comprehensive 14 point system review is negative   Physical Exam BP (!) 167/78 (BP Location: Left Arm, Patient Position: Sitting)   Pulse (!) 58    Ht _0  (1.549 m)   Wt 177 lb 6.4 oz (80.5 kg)   SpO2 96%   BMI 33.52 kg/m    Repeat blood pressure by me was 164/64.  She states her blood pressure at home may be 140/60 2 in the morning but during the day may significantly increase.  Wt Readings from Last 3 Encounters:  07/14/22 177 lb 6.4 oz (80.5 kg)  06/30/22 175 lb (79.4 kg)  06/08/22 174 lb (78.9 kg)    General: Alert, oriented, no distress.  Skin: normal turgor, no rashes, warm and dry HEENT: Normocephalic, atraumatic. Pupils equal round and reactive to light; sclera anicteric; extraocular muscles intact;  Nose without nasal septal hypertrophy Mouth/Parynx benign; Mallinpatti scale 3 Neck: No JVD, no carotid bruits; normal carotid upstroke Lungs: clear to ausculatation and percussion; no wheezing or rales Chest wall: without tenderness to palpitation Heart: PMI not displaced, RRR, s1 s2 normal, 1/6 systolic murmur, no diastolic murmur, no rubs, gallops, thrills, or heaves Abdomen: soft, nontender; no hepatosplenomehaly, BS+; abdominal aorta nontender and not dilated by palpation. Back: no CVA tenderness Pulses 2+ Musculoskeletal: full range of motion, normal strength, no joint deformities Extremities: no clubbing cyanosis or edema, Homan's sign negative  Neurologic: grossly nonfocal; Cranial nerves grossly wnl Psychologic: Normal mood and affect  July 14, 2022 ECG (independently read by me): Sinus bradycardia at 58, PRWP anteriolry   Jan 23, 2022 ECG (independently read by me): NSR at 61, NSSTT changes; QTc 457 msec    July 30, 2021 ECG (independently read by me):  NSR at 64; NSST changes; QTc 464 msec  Jan 29, 2021 ECG (independently read by me): NSR at 65; nonspecific ST changes  November 17, 2021ECG (independently read by me): Normal sinus rhythm at 63 bpm.  Normal intervals.  Mild T wave abnormality in aVL  October 2020 ECG (independently read by me): Normal sinus rhythm 64 bpm.  Nonspecific ST changes.    February 2020 ECG (independently read by me): NSR ay 68; nonspecific lateral T wave abnormality.  Normal intervals.  No ectopy.  May 2019 ECG (independently read by me): Normal sinus rhythm at 66 bpm.  Lateral T wave abnormality in leads I and aVL.  Normal intervals.  October 2018 ECG (independently read by me): Normal sinus rhythm at 66 bpm.  Nondiagnostic T change laterally.  Normal intervals.  No ectopy.  April 2018 ECG (independently read by me): Normal sinus rhythm at 67 bpm.  PR interval 142 ms.  Poor anterior R-wave progression.  Lateral T changes in 1 and L.  December 2017 ECG (independently read by me): Normal sinus rhythm at 70 bpm.  Nonspecific ST changes.  No ectopy.  March 2017 ECG (independently read by me):  Normal sinus rhythm at 70 bpm. Nonspecific ST changes laterally.  03/04/2015 ECG (independently read by me): Normal sinus rhythm at 65 bpm.  Mild T wave abnormality in lead 1 and aVL.  LABS:     Latest Ref Rng & Units 06/30/2022  1:09 PM 07/23/2021    3:06 PM 12/26/2020    1:43 AM  BMP  Glucose 70 - 99 mg/dL 135  234  157   BUN 8 - 23 mg/dL _0 Creatinine 0.44 - 1.00 mg/dL 0.70  0.72  0.81   Sodium 135 - 145 mmol/L 136  135  136   Potassium 3.5 - 5.1 mmol/L 3.8  3.7  3.8   Chloride 98 - 111 mmol/L 103  97  102   CO2 22 - 32 mmol/L _1 Calcium 8.9 - 10.3 mg/dL 9.2  9.7  9.4         Latest Ref Rng & Units 06/30/2022    1:09 PM 07/23/2021    3:06 PM 12/26/2020    1:43 AM  Hepatic Function  Total Protein 6.5 - 8.1 g/dL 7.7  7.4  6.0   Albumin 3.5 - 5.0 g/dL 4.0  4.4  3.3   AST 15 - 41 U/L _2 ALT 0 - 44 U/L _3 Alk Phosphatase 38 - 126 U/L 62  76  58   Total Bilirubin 0.3 - 1.2 mg/dL 0.9  0.6  0.8        Latest Ref Rng & Units 06/30/2022    1:09 PM 02/19/2022   10:05 AM 07/23/2021    3:06 PM  CBC  WBC 4.0 - 10.5 K/uL 9.5  13.1  8.2   Hemoglobin 12.0 - 15.0 g/dL 14.7  13.6  14.5   Hematocrit 36.0 - 46.0 % 44.4   40.2  42.8   Platelets 150 - 400 K/uL 304  268.0  289.0    Lab Results  Component Value Date   MCV 88.3 06/30/2022   MCV 89.3 02/19/2022   MCV 90.1 07/23/2021    Lab Results  Component Value Date   TSH 1.987 03/19/2015    BNP No results found for: "BNP"  ProBNP No results found for: "PROBNP"   Lipid Panel     Component Value Date/Time   CHOL 300 (H) 06/21/2019 1855   TRIG 183 (H) 06/21/2019 1855   HDL 39 (L) 06/21/2019 1855   CHOLHDL 7.7 06/21/2019 1855   VLDL 37 06/21/2019 1855   LDLCALC 224 (H) 06/21/2019 1855   LDLDIRECT 160.9 06/21/2012 0833     RADIOLOGY: CT ABDOMEN PELVIS WO CONTRAST  Result Date: 06/30/2022 CLINICAL DATA:  Abdominal pain EXAM: CT ABDOMEN AND PELVIS WITHOUT CONTRAST TECHNIQUE: Multidetector CT imaging of the abdomen and pelvis was performed following the standard protocol without IV contrast. RADIATION DOSE REDUCTION: This exam was performed according to the departmental dose-optimization program which includes automated exposure control, adjustment of the mA and/or kV according to patient size and/or use of iterative reconstruction technique. COMPARISON:  02/28/2022 FINDINGS: Lower chest: No acute abnormality. Coronary artery calcifications. Scarring and or atelectasis of the bilateral lung bases. Hepatobiliary: No solid liver abnormality is seen. No gallstones, gallbladder wall thickening, or biliary dilatation. Pancreas: Unremarkable. No pancreatic ductal dilatation or surrounding inflammatory changes. Spleen: Normal in size without significant abnormality. Adrenals/Urinary Tract: Adrenal glands are unremarkable. Kidneys are normal, without renal calculi, solid lesion, or hydronephrosis. Bladder is unremarkable. Stomach/Bowel: Stomach is within normal limits. Appendix appears normal. No evidence of bowel wall thickening, distention, or inflammatory changes. Descending and sigmoid diverticulosis. Vascular/Lymphatic: Aortic atherosclerosis. No enlarged  abdominal or pelvic lymph nodes. Reproductive: Status post hysterectomy. Other: No abdominal  wall hernia or abnormality. No ascites. Musculoskeletal: No acute or significant osseous findings. IMPRESSION: 1. No acute noncontrast CT findings of the abdomen or pelvis to explain abdominal pain. 2. Descending and sigmoid diverticulosis without evidence of acute diverticulitis. 3. Status post hysterectomy. 4. Coronary artery disease. Aortic Atherosclerosis (ICD10-I70.0). Electronically Signed   By: Delanna Ahmadi M.D.   On: 06/30/2022 15:28    IMPRESSION:  1. Essential hypertension   2. Coronary artery disease involving coronary bypass graft of native heart without angina pectoris   3. Hyperlipidemia LDL goal <55   4. Diverticular disease   5. Statin intolerance   6. Type 2 diabetes mellitus without complication, with long-term current use of insulin ([place])   7. Preoperative clearance     ASSESSMENT AND PLAN: Ms. Urvi Imes is a 76 year old female who is 17 years status post CABG revascularization surgery 4  at the [facility] in [place], [place] in 2006.  She is status post remote DES stenting to the vein graft supplying her RCA territory.  She developed recurrent anginal symptomatology leading to repeat cardiac catheterization on 03/01/2017. 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  She has had issues with blood pressure lability.  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  There also was active inflammation on the right at C2-3 and C3-4 there was concern for possible left radiculopathy.  When I saw her at aprior evaluation I  recommended she change the way she took her medications and apparently at that time she was taking medications every 2 hours throughout the day.  She has always been hesitant to make certain changes and has many drug allergies.  He has intolerances to numerous medications.  Most recently she has been taking amlodipine 5 mg and 2 hours later an additional 2.5 mg which she has tolerated.  She continues to be on metoprolol tartrate 100 mg twice a day, valsartan 160 mg twice a day.  Blood pressure continues to be elevated.  I have suggested changing amlodipine to 10 mg but she would prefer taking a 5 twice a day regimen rather than 10 mg daily.  Reviewed her most recent lipid studies.  All these are significantly improved since she has been on low-dose Praluent, I did recommend she titrate this to 150 mg regimen.  However she prefers not to do this.  In the past I had recommended an ideal LDL less than 55 if at all possible and with her LDL at 89 strongly suggested the higher dose.  In the past she did not tolerate statins and did not tolerate Zetia. Her blood pressure today is elevated and on repeat by me was 165/78.  I have read the recommended further titration of her amlodipine from 7.5 mg up to 10 mg daily.  She is in need to undergo colonoscopy which will be done by [facility].  With her being on Effient, she will need to hold this for 7 days prior to the procedure I have suggested that if necessary she can hold aspirin for 5 days depending upon GI  I will see her in 6 months for reevaluation or sooner as needed.  Troy Sine, MD, [facility]  07/21/2022 2:23 PM

## 2022-07-15 NOTE — Telephone Encounter (Signed)
Manuela Schwartz from [facility] is calling back to get update on this pre-op clearance.

## 2022-07-15 NOTE — Telephone Encounter (Signed)
I will send a message to Dr. Claiborne Billings and pre op provider to review if pt has been cleared.

## 2022-07-15 NOTE — Telephone Encounter (Signed)
Dr. Claiborne Billings, you recently saw patient in clinic.  Please provide recommendations holding Effient and risk of upcoming colonoscopy/EGD.  Thank you for your help.  Please respond to [facility] preop pool.  Thank you for your help  Jossie Ng. Dalyah Pla NP-C     07/15/2022, 3:42 PM [address] Office [phone] Fax [phone]

## 2022-07-15 NOTE — Telephone Encounter (Signed)
I s/w Ruth [name] at the GI office and informed her that I have sent Dr. Claiborne Billings and the pre op provider to review for pre op clearance.

## 2022-07-16 NOTE — Telephone Encounter (Signed)
Please see office visit encounter from 07/14/22 with Dr. Claiborne Billings for complete details on clearance. That note has been faxed to [facility].  Emmaline Life, NP-C  07/16/2022, 9:11 AM [address], [address] Office [phone] Fax [phone]

## 2022-07-21 ENCOUNTER — Encounter: Payer: Self-pay | Admitting: Cardiovascular Disease

## 2022-07-21 NOTE — Telephone Encounter (Signed)
Notes re-faxed today, see previous notes from Christen Bame, NP.

## 2022-07-21 NOTE — Telephone Encounter (Signed)
[facility] is calling in regards to this pre-op clearance and needing clearance info to be faxed to them at [phone] Attn: Manuela Schwartz

## 2022-07-27 NOTE — Telephone Encounter (Signed)
Please refax to requesting office - Attn: Manuela Schwartz  [phone]

## 2022-07-28 ENCOUNTER — Ambulatory Visit: Payer: Medicare Other | Admitting: Podiatry

## 2022-07-28 DIAGNOSIS — M79675 Pain in left toe(s): Secondary | ICD-10-CM | POA: Diagnosis not present

## 2022-07-28 DIAGNOSIS — M79674 Pain in right toe(s): Secondary | ICD-10-CM

## 2022-07-28 DIAGNOSIS — L600 Ingrowing nail: Secondary | ICD-10-CM

## 2022-07-28 DIAGNOSIS — B351 Tinea unguium: Secondary | ICD-10-CM | POA: Diagnosis not present

## 2022-07-28 MED ORDER — AMOXICILLIN-POT CLAVULANATE 875-125 MG PO TABS: 1.0000 | ORAL_TABLET | Freq: Two times a day (BID) | ORAL | 0 refills | Status: AC

## 2022-07-28 NOTE — Telephone Encounter (Signed)
NOTES HAVE BEEN RE-FAXED A NUMBER OF TIMES TO REQUESTING OFFICE. I WILL RE-FAX TO THE FAX # GIVEN TODAY [phone].

## 2022-07-28 NOTE — Patient Instructions (Signed)

## 2022-07-28 NOTE — Progress Notes (Signed)
  Subjective:  Patient ID: Ruth [name], female    DOB: September 04, 1946,  MRN: [id number]  Chief Complaint  Patient presents with   Nail Problem    Thick painful toenails, 3 month follow up, diabetic with left great toenail that feels ingrown     76 y.o. female presents with the above complaint. History confirmed with patient.  She has not noticed any drainage.  Her A1c is up a bit to 7.5%.  She notes redness on the left side Objective:  Physical Exam: warm, good capillary refill, no trophic changes or ulcerative lesions, normal DP and PT pulses, normal monofilament exam, normal sensory exam, and she has mild ingrowing left hallux nail medial border, some erythema but there is no paronychia no drainage no signs of infection.  Right hallux also tender medial  Assessment:   1. Ingrowing left great toenail   2. Pain due to onychomycosis of toenails of both feet      Plan:  Patient was evaluated and treated and all questions answered.  Debridement of the incurvated border of the nail was performed today.  Discussed long-term treatment of this including partial permanent matricectomy.  Augmentin prescription sent to pharmacy in case this worsens.  She would like to wait till after the new year to perform the partial prior matricectomy.  Slant back procedure performed today.  She will return next month for the procedure  Return in about 6 weeks (around 09/08/2022).

## 2022-08-13 ENCOUNTER — Ambulatory Visit (HOSPITAL_COMMUNITY)
Admission: RE | Admit: 2022-08-13 | Discharge: 2022-08-13 | Disposition: A | Discharge status: Home / Self Care | Payer: Medicare Other | Source: Ambulatory Visit | Attending: Vascular Surgery | Admitting: Vascular Surgery

## 2022-08-13 ENCOUNTER — Other Ambulatory Visit (HOSPITAL_COMMUNITY): Payer: Self-pay | Admitting: Internal Medicine

## 2022-08-13 DIAGNOSIS — R609 Edema, unspecified: Secondary | ICD-10-CM | POA: Diagnosis present

## 2022-08-26 ENCOUNTER — Telehealth: Payer: Self-pay | Admitting: Cardiovascular Disease

## 2022-08-26 NOTE — Telephone Encounter (Signed)
Patient states she been bleeding from the rectum and she didn't take her blood thinner or asprin. Please advise

## 2022-08-26 NOTE — Telephone Encounter (Signed)
Spoke with patient of Dr. Claiborne Billings. She is having bleeding from rectum - she reports it has not stopped but is slowing down. She already called her GI doctor. She did not take aspirin or effient - held today's doses. She will resume tomorrow.

## 2022-09-10 ENCOUNTER — Ambulatory Visit: Payer: Medicare Other | Admitting: Podiatry

## 2022-09-14 ENCOUNTER — Encounter (HOSPITAL_COMMUNITY): Payer: Self-pay

## 2022-09-14 ENCOUNTER — Emergency Department (HOSPITAL_COMMUNITY)
Admission: EM | Admit: 2022-09-14 | Discharge: 2022-09-14 | Disposition: A | Discharge status: Home / Self Care | Payer: Medicare Other | Attending: Emergency Medicine | Admitting: Emergency Medicine

## 2022-09-14 ENCOUNTER — Other Ambulatory Visit: Payer: Self-pay | Admitting: Cardiovascular Disease

## 2022-09-14 ENCOUNTER — Emergency Department (HOSPITAL_COMMUNITY): Payer: Medicare Other

## 2022-09-14 ENCOUNTER — Other Ambulatory Visit: Payer: Self-pay

## 2022-09-14 DIAGNOSIS — Z794 Long term (current) use of insulin: Secondary | ICD-10-CM | POA: Insufficient documentation

## 2022-09-14 DIAGNOSIS — Z7982 Long term (current) use of aspirin: Secondary | ICD-10-CM | POA: Diagnosis not present

## 2022-09-14 DIAGNOSIS — Z91040 Latex allergy status: Secondary | ICD-10-CM | POA: Insufficient documentation

## 2022-09-14 DIAGNOSIS — N390 Urinary tract infection, site not specified: Secondary | ICD-10-CM | POA: Diagnosis not present

## 2022-09-14 DIAGNOSIS — I2510 Atherosclerotic heart disease of native coronary artery without angina pectoris: Secondary | ICD-10-CM

## 2022-09-14 DIAGNOSIS — E7800 Pure hypercholesterolemia, unspecified: Secondary | ICD-10-CM

## 2022-09-14 DIAGNOSIS — R300 Dysuria: Secondary | ICD-10-CM | POA: Diagnosis present

## 2022-09-14 LAB — URINALYSIS, ROUTINE W REFLEX MICROSCOPIC
Bilirubin Urine: NEGATIVE
Glucose, UA: NEGATIVE mg/dL
Ketones, ur: NEGATIVE mg/dL
Nitrite: POSITIVE — AB
Protein, ur: NEGATIVE mg/dL
Specific Gravity, Urine: 1.006 (ref 1.005–1.030)
WBC, UA: >50 WBC/hpf — ABNORMAL HIGH (ref 0–5)
pH: 6 (ref 5.0–8.0)

## 2022-09-14 LAB — CBC WITH DIFFERENTIAL/PLATELET
Abs Immature Granulocytes: 0.04 10*3/uL (ref 0.00–0.07)
Basophils Absolute: 0.1 10*3/uL (ref 0.0–0.1)
Basophils Relative: 1 %
Eosinophils Absolute: 0.1 10*3/uL (ref 0.0–0.5)
Eosinophils Relative: 1 %
HCT: 44.1 % (ref 36.0–46.0)
Hemoglobin: 14.6 g/dL (ref 12.0–15.0)
Immature Granulocytes: 1 %
Lymphocytes Relative: 16 %
Lymphs Abs: 1.3 10*3/uL (ref 0.7–4.0)
MCH: 30 pg (ref 26.0–34.0)
MCHC: 33.1 g/dL (ref 30.0–36.0)
MCV: 90.7 fL (ref 80.0–100.0)
Monocytes Absolute: 0.7 10*3/uL (ref 0.1–1.0)
Monocytes Relative: 9 %
Neutro Abs: 5.7 10*3/uL (ref 1.7–7.7)
Neutrophils Relative %: 72 %
Platelets: 313 10*3/uL (ref 150–400)
RBC: 4.86 MIL/uL (ref 3.87–5.11)
RDW: 13.2 % (ref 11.5–15.5)
WBC: 7.9 10*3/uL (ref 4.0–10.5)
nRBC: 0 % (ref 0.0–0.2)

## 2022-09-14 LAB — COMPREHENSIVE METABOLIC PANEL
ALT: 14 U/L (ref 0–44)
AST: 15 U/L (ref 15–41)
Albumin: 3.7 g/dL (ref 3.5–5.0)
Alkaline Phosphatase: 58 U/L (ref 38–126)
Anion gap: 10 (ref 5–15)
BUN: 16 mg/dL (ref 8–23)
CO2: 26 mmol/L (ref 22–32)
Calcium: 9.2 mg/dL (ref 8.9–10.3)
Chloride: 100 mmol/L (ref 98–111)
Creatinine, Ser: 0.69 mg/dL (ref 0.44–1.00)
GFR, Estimated: >60 mL/min (ref >60)
Glucose, Bld: 190 mg/dL — ABNORMAL HIGH (ref 70–99)
Potassium: 3.7 mmol/L (ref 3.5–5.1)
Sodium: 136 mmol/L (ref 135–145)
Total Bilirubin: 0.7 mg/dL (ref 0.3–1.2)
Total Protein: 7.3 g/dL (ref 6.5–8.1)

## 2022-09-14 LAB — CBG MONITORING, ED
Glucose-Capillary: 146 mg/dL — ABNORMAL HIGH (ref 70–99)
Glucose-Capillary: 129 mg/dL — ABNORMAL HIGH (ref 70–99)

## 2022-09-14 MED ORDER — FOSFOMYCIN TROMETHAMINE 3 G PO PACK
3.0000 g | PACK | Freq: Once | ORAL | Status: AC
  Administered 2022-09-14: 3 g via ORAL
  Filled 2022-09-14: qty 3

## 2022-09-14 MED ORDER — FOSFOMYCIN TROMETHAMINE 3 G PO PACK: 3.0000 g | PACK | Freq: Once | ORAL | 0 refills | Status: AC

## 2022-09-14 NOTE — ED Triage Notes (Signed)
Patient c/o urinary frequency and dysuria x 4 days. Patient was told by her urologist to come to the ED for IV antibiotics for a UTI.

## 2022-09-14 NOTE — ED Provider Triage Note (Signed)
Emergency Medicine Provider Triage Evaluation Note  Ruth [name] , a 77 y.o. female  was evaluated in triage.  Pt complains of back pain and dysuria.  Patient reports that she was having by her primary care provider for IV antibiotics that she has a UTI with the specific bacteria that she has antibiotic allergies to and is unable to take oral medications for.  Attempted to locate these results from outside provider but was unable to find any signs of any recent urinalysis or urine culture showing bacterial growth or antibiotic resistant patterns.  Advised patient that some labs may need to be repeated for unable to obtain these results but she stated that she would refuse to have any further lab work done to assess her current workup and just wants IV antibiotics.  Review of Systems  Positive: As above Negative: As above  Physical Exam  BP (!) 187/93 (BP Location: Left Arm) Comment: PA made aware  Pulse 84   Temp 98 F (36.7 C) (Oral)   Resp 16   SpO2 97%  Gen:   Awake, no distress   Resp:  Normal effort  MSK:   Moves extremities without difficulty  Other:  Bilateral flank pain, minimal CVA tenderness  Medical Decision Making  Medically screening exam initiated at 2:12 PM.  Appropriate orders placed.  Ruth [name] was informed that the remainder of the evaluation will be completed by another provider, this initial triage assessment does not replace that evaluation, and the importance of remaining in the ED until their evaluation is complete.     Luvenia Heller, PA-C 09/14/22 1414

## 2022-09-14 NOTE — Discharge Instructions (Signed)
Take the antibiotic for 1 dose on Thursday, January 11.  Make sure that you follow-up with your primary care doctor within the next few days to ensure that your symptoms are improving.  Return to the emergency room for any worsening symptoms.

## 2022-09-14 NOTE — ED Provider Notes (Signed)
[facility] DEPT Provider Note   CSN: [id number] Arrival date & time: 09/14/22  1334     History  Chief Complaint  Patient presents with   Back Pain   Urinary Frequency   Dysuria    Ruth [name] is a 77 y.o. female.  Patient is a 77 year old female who presents with urinary frequency and urgency for the last 4 days.  She said the day after it started she took a urine sample to her primary care provider who is Dr. Joylene Draft with [facility].  Reportedly they got the culture results back today and said that she needed IV antibiotics.  They advised that they were sending the culture report to the ED.  She has some discomfort to her lower abdomen.  No nausea or vomiting.  No fevers.       Home Medications Prior to Admission medications   Medication Sig Start Date End Date Taking? Authorizing Provider  fosfomycin (MONUROL) 3 g PACK Take 3 g by mouth once for 1 dose. Take this on 09/17/22 (Thursday). 09/14/22 09/14/22 Yes Malvin Johns, MD  amLODipine (NORVASC) 10 MG tablet Take 1 tablet (10 mg) by mouth every morning. 07/14/22   Troy Sine, MD  aspirin EC 81 MG tablet Take 81 mg by mouth daily.    [provider]  BD PEN NEEDLE NANO 2ND GEN 32G X 4 MM MISC USE THREE TIMES DAILY AS DIRECTED WITH HUMALOG AND TOUJEO 06/04/20   [provider]  calcium carbonate (TUMS - DOSED IN MG ELEMENTAL CALCIUM) 500 MG chewable tablet Chew 1-2 tablets by mouth 3 (three) times daily as needed for heartburn.     [provider]  cholecalciferol (VITAMIN D) 1000 UNITS tablet Take 1,000 Units by mouth daily.    [provider]  CRANBERRY-VITAMIN C PO Take 1 tablet by mouth daily.    [provider]  CREON 36000-114000 units CPEP capsule Take 36,000 Units by mouth.    [provider]  EPINEPHrine 0.3 mg/0.3 mL IJ SOAJ injection Inject 0.3 mg into the muscle as needed. 10/05/19   [provider]   HUMALOG KWIKPEN 100 UNIT/ML KwikPen Inject 5 Units into the skin in the morning and at bedtime. 06/17/20   [provider]  Insulin Glargine (TOUJEO MAX SOLOSTAR [place]) Inject 50 Units into the skin daily.     [provider]  insulin glargine, 1 Unit Dial, (TOUJEO SOLOSTAR) 300 UNIT/ML Solostar Pen Inject 50 Units into the skin daily. At lunch time.    [provider]  Lancets (ONETOUCH DELICA PLUS [id number]) [place] USE TO [place] DAILY 06/03/20   [provider]  metoprolol tartrate (LOPRESSOR) 100 MG tablet TAKE 1 TABLET(100 MG) BY MOUTH TWICE DAILY Patient taking differently: Take 100 mg by mouth 2 (two) times daily. 03/07/20   Troy Sine, MD  nitrofurantoin (MACRODANTIN) 100 MG capsule Take 100 mg by mouth daily. 11/21/21   [provider]  nitroGLYCERIN (NITROSTAT) 0.4 MG SL tablet Place 1 tablet (0.4 mg total) under the tongue every 5 (five) minutes as needed for chest pain (up to 3 doses). 08/21/19   Lendon Colonel, NP  [facility] VERIO test strip  07/23/20   [provider]  PRALUENT 75 MG/ML SOAJ ADMINISTER 1 ML UNDER THE SKIN EVERY 14 DAYS 09/14/22   Troy Sine, MD  prasugrel (EFFIENT) 10 MG TABS tablet TAKE 1 TABLET(10 MG) BY MOUTH DAILY 12/11/21  Troy Sine, MD  Probiotic Product (PROBIOTIC-10 PO) Take 1 capsule by mouth daily.    [provider]  valsartan (DIOVAN) 160 MG tablet Take 1 tablet (160 mg total) by mouth 2 (two) times daily. TAKE 1 TABLET(160 MG) BY MOUTH TWICE DAILY Strength: 160 mg 03/20/22   Deberah Pelton, NP  vitamin B-12 (CYANOCOBALAMIN) 1000 MCG tablet Take 1,000 mcg by mouth daily.    [provider]      Allergies    Betadine [povidone iodine], Codeine, Demerol, Iohexol, Latex, Other, Percocet [oxycodone-acetaminophen], Plavix [clopidogrel bisulfate], Red dye, Shellfish allergy, Sulfonamide derivatives, Tylenol [acetaminophen], Azilsartan, Azithromycin,  Barbiturates, Cardizem [diltiazem], Ciprofloxacin, Clonidine, Crestor [rosuvastatin], Ezetimibe, Fluogen [influenza virus vaccine], Furosemide, Hydralazine hcl, Hydrochlorothiazide, Miralax [polyethylene glycol], Omega-3-acid ethyl esters, Pravastatin sodium, Spironolactone, Statins, Sulfa antibiotics, Vancomycin, Warfarin, and Imdur [isosorbide nitrate]    Review of Systems   Review of Systems  Constitutional:  Negative for chills, diaphoresis, fatigue and fever.  HENT:  Negative for congestion, rhinorrhea and sneezing.   Eyes: Negative.   Respiratory:  Negative for cough, chest tightness and shortness of breath.   Cardiovascular:  Negative for chest pain and leg swelling.  Gastrointestinal:  Positive for abdominal pain (Suprapubic). Negative for blood in stool, diarrhea, nausea and vomiting.  Genitourinary:  Positive for dysuria, frequency and urgency. Negative for difficulty urinating, flank pain and hematuria.  Musculoskeletal:  Negative for arthralgias and back pain.  Skin:  Negative for rash.  Neurological:  Negative for dizziness, speech difficulty, weakness, numbness and headaches.    Physical Exam Updated Vital Signs BP (!) 193/87   Pulse 66   Temp 98 F (36.7 C)   Resp 17   Ht 5' 1.5" (1.562 m)   Wt 78.9 kg   SpO2 98%   BMI 32.34 kg/m  Physical Exam Constitutional:      Appearance: She is well-developed.  HENT:     Head: Normocephalic and atraumatic.  Eyes:     Pupils: Pupils are equal, round, and reactive to light.  Cardiovascular:     Rate and Rhythm: Normal rate and regular rhythm.     Heart sounds: Normal heart sounds.  Pulmonary:     Effort: Pulmonary effort is normal. No respiratory distress.     Breath sounds: Normal breath sounds. No wheezing or rales.  Chest:     Chest wall: No tenderness.  Abdominal:     General: Bowel sounds are normal.     Palpations: Abdomen is soft.     Tenderness: There is no abdominal tenderness. There is no guarding or rebound.   Musculoskeletal:        General: Normal range of motion.     Cervical back: Normal range of motion and neck supple.  Lymphadenopathy:     Cervical: No cervical adenopathy.  Skin:    General: Skin is warm and dry.     Findings: No rash.  Neurological:     Mental Status: She is alert and oriented to person, place, and time.     ED Results / Procedures / Treatments   Labs (all labs ordered are listed, but only abnormal results are displayed) Labs Reviewed  URINALYSIS, ROUTINE W REFLEX MICROSCOPIC - Abnormal; Notable for the following components:      Result Value   APPearance CLOUDY (*)    Hgb urine dipstick SMALL (*)    Nitrite POSITIVE (*)    Leukocytes,Ua LARGE (*)    WBC, UA >50 (*)    Bacteria, UA MANY (*)  All other components within normal limits  COMPREHENSIVE METABOLIC PANEL - Abnormal; Notable for the following components:   Glucose, Bld 190 (*)    All other components within normal limits  CBG MONITORING, ED - Abnormal; Notable for the following components:   Glucose-Capillary 146 (*)    All other components within normal limits  CBG MONITORING, ED - Abnormal; Notable for the following components:   Glucose-Capillary 129 (*)    All other components within normal limits  URINE CULTURE  CBC WITH DIFFERENTIAL/PLATELET    EKG None  Radiology No results found.  Procedures Procedures    Medications Ordered in ED Medications  fosfomycin (MONUROL) packet 3 g (3 g Oral Given 09/14/22 1813)    ED Course/ Medical Decision Making/ A&P                           Medical Decision Making Risk Prescription drug management.   Patient is a 77 year old female who presents with a reported UTI.  I was able to get the urine and culture results from Dr. Joylene Draft office.  Her urinalysis actually did not look infected.  However the culture did grow out Citrobacter which had resistance to multiple antibiotics.  Patient also was complicated by the fact that she has multiple  drug allergies.  I consulted with the pharmacy and it does sound like fosfomycin would be a good option.  The pharmacist recommends giving 1 dose here in the ED and giving a second dose given that it was ESBL 72 hours later.  Patient had labs here that are nonconcerning.  She does not look systemically ill.  Her urine does show suggestions of infection on her urinalysis.  She was given a dose of fosfomycin and monitored for a couple of hours without any signs of allergic reaction.  She has an appointment to follow-up later this week with her primary care doctor.  Return precautions were given.  Final Clinical Impression(s) / ED Diagnoses Final diagnoses:  Lower urinary tract infectious disease    Rx / DC Orders ED Discharge Orders          Ordered    fosfomycin (MONUROL) 3 g PACK   Once        09/14/22 1920              Malvin Johns, MD 09/14/22 [place]

## 2022-09-16 LAB — URINE CULTURE: Culture: >=100000 — AB

## 2022-09-17 ENCOUNTER — Telehealth (HOSPITAL_BASED_OUTPATIENT_CLINIC_OR_DEPARTMENT_OTHER): Payer: Self-pay

## 2022-09-17 NOTE — Telephone Encounter (Signed)
Post ED Visit - Positive Culture Follow-up  Culture report reviewed by antimicrobial stewardship pharmacist: [facility] Team '[]'$  Elenor Quinones, Pharm.D. '[x]'$  Heide Guile, Pharm.D., BCPS AQ-ID '[]'$  Parks Neptune, Pharm.D., BCPS '[]'$  Alycia Rossetti, Pharm.D., BCPS '[]'$  [place], Pharm.D., BCPS, AAHIVP '[]'$  Legrand Como, Pharm.D., BCPS, AAHIVP '[]'$  Salome Arnt, PharmD, BCPS '[]'$  Johnnette Gourd, PharmD, BCPS '[]'$  Hughes Better, PharmD, BCPS '[]'$  Leeroy Cha, PharmD '[]'$  Laqueta Linden, PharmD, BCPS '[]'$  Albertina Parr, PharmD  [facility] Team '[]'$  Leodis Sias, PharmD '[]'$  Lindell Spar, PharmD '[]'$  Royetta Asal, PharmD '[]'$  Graylin Shiver, Rph '[]'$  Rema Fendt) Glennon Mac, PharmD '[]'$  Arlyn Dunning, PharmD '[]'$  Netta Cedars, PharmD '[]'$  Dia Sitter, PharmD '[]'$  Leone Haven, PharmD '[]'$  Gretta Arab, PharmD '[]'$  Theodis Shove, PharmD '[]'$  Peggyann Juba, PharmD '[]'$  Reuel Boom, PharmD   Positive urine culture Treated with Fosfomycin, organism sensitive to the same and no further patient follow-up is required at this time.  Glennon Hamilton 09/17/2022, 9:43 AM

## 2022-09-24 ENCOUNTER — Other Ambulatory Visit (HOSPITAL_COMMUNITY): Payer: Self-pay

## 2022-09-28 ENCOUNTER — Other Ambulatory Visit: Payer: Self-pay | Admitting: Cardiovascular Disease

## 2022-11-18 ENCOUNTER — Telehealth: Payer: Self-pay | Admitting: Cardiovascular Disease

## 2022-11-18 NOTE — Telephone Encounter (Signed)
She should ask her GI doctor or check her instructions. Sometimes they have specific things they want held either the day of or several days before. Its fine to hold if GI wants it held.

## 2022-11-18 NOTE — Telephone Encounter (Signed)
Called patient, advised of message from [place] to contact GI provider (she will do this).   She also asked about Praulent injection that is due same day at Colonoscopy. I spoke with Gerald Stabs, [facility] who states it would be okay to take the injection after the Colonoscopy.   Also had concerns on when to start back on the effient and aspirin- advised surgeon would advise on when to restart.  Patient verbalized understanding.

## 2022-11-18 NOTE — Telephone Encounter (Signed)
Called patient, advised that I would send a message over to make sure, patient states she has her colonoscopy on Tuesday. She was advised of how to hold effient and aspirin, however she wanted to check on the magnesium. I advised this should be okay, but would check to make sure.   Pharmacy team: any reason to hold magnesium before? Just checking.   Thanks!

## 2022-11-18 NOTE — Telephone Encounter (Signed)
Pt would like a callback regarding if she should continue to take her Magnesium vitamin since she has a Colonoscopy on 3/19 and she's very worried about it. Please advise.

## 2022-12-19 IMAGING — MR MR HEAD W/O CM
12 of 13 series · 44 of 48 positions shown · non-contrast
Comparison: Head CT from earlier today

CLINICAL DATA: Awoke with dizziness.  Hypertension.

EXAM:
MRI HEAD WITHOUT CONTRAST
TECHNIQUE: Multiplanar, multiecho pulse sequences of the brain and surrounding
structures were obtained without intravenous contrast.

[Series 5: DWI · axial · 3.0mm · 0.88mm/px · z∈[-124,+28]mm · 8 of 104 slices shown (1 of 4)]
[im 1/104]
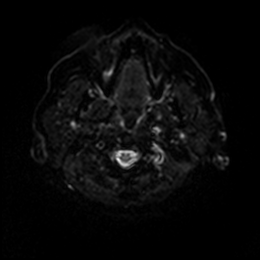
[im 15/104]
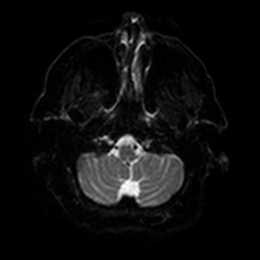
[im 30/104]
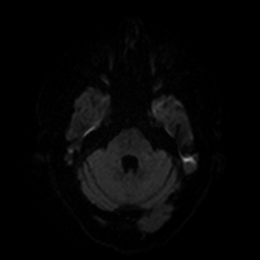
[im 45/104]
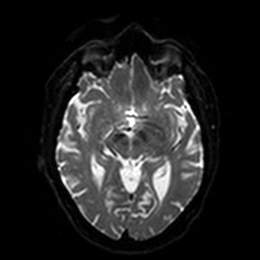
[im 59/104]
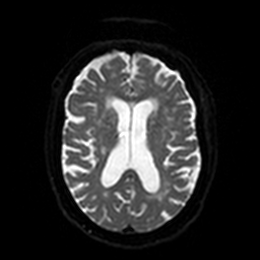
[im 74/104]
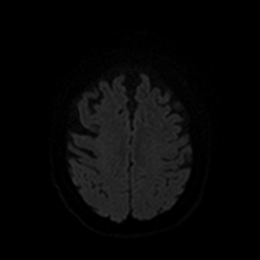
[im 89/104]
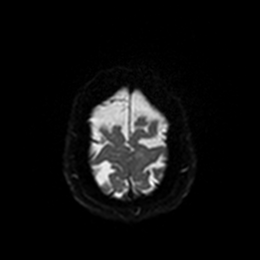
[im 104/104]
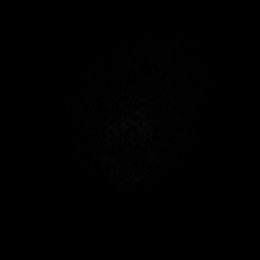

[Series 6: DWI · axial · 3.0mm · 0.88mm/px · z∈[-124,+28]mm · 4 of 52 slices shown (2 of 4)]
[im 1/52]
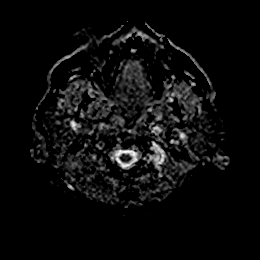
[im 18/52]
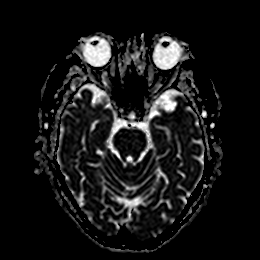
[im 35/52]
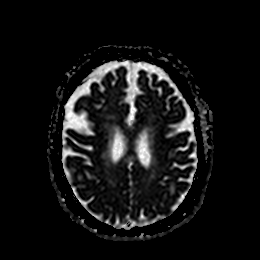
[im 52/52]
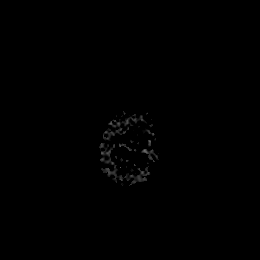

[Series 7: DWI · coronal · 4.0mm · 0.88mm/px · 6 of 72 slices shown (3 of 4)]
[im 1/72]
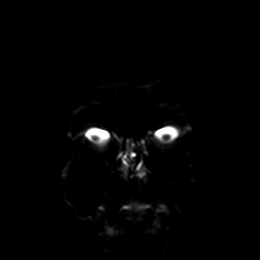
[im 15/72]
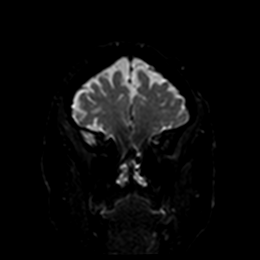
[im 29/72]
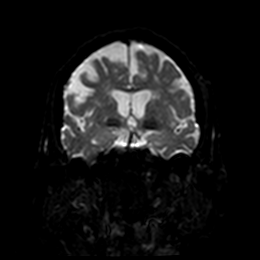
[im 43/72]
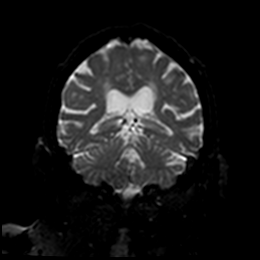
[im 57/72]
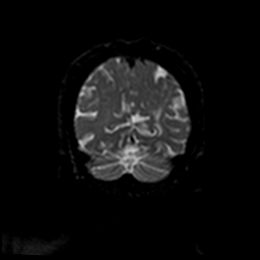
[im 72/72]
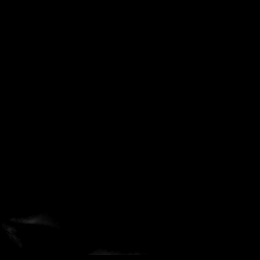

[Series 8: DWI · coronal · 4.0mm · 0.88mm/px · 3 of 36 slices shown (4 of 4)]
[im 1/36]
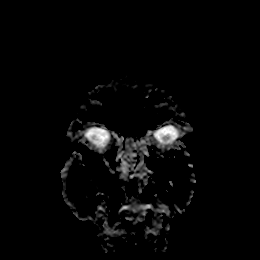
[im 18/36]
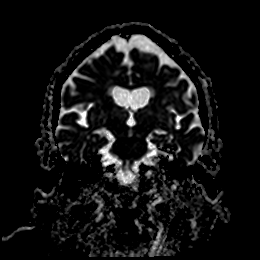
[im 36/36]
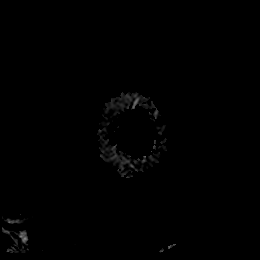

[Series 9: T1 · sagittal · 5.0mm · 0.75mm/px · 2 of 23 slices shown]
[im 1/23]
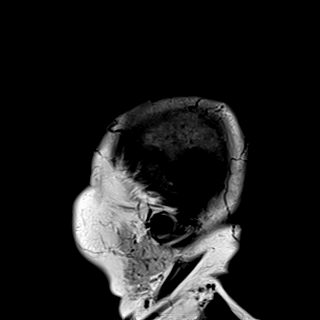
[im 23/23]
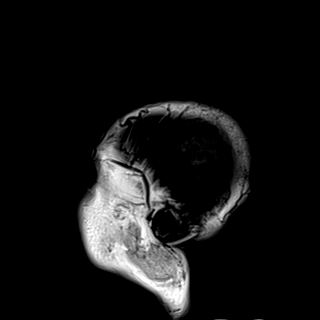

[Series 10: T2 · axial · 5.0mm · 0.72mm/px · z∈[-107,+35]mm · 2 of 25 slices shown (1 of 2)]
[im 1/25]
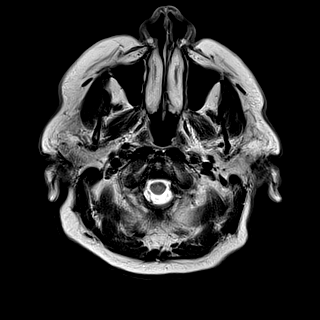
[im 25/25]
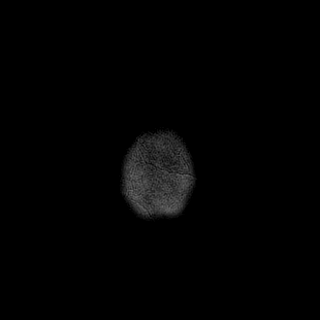

[Series 11: FLAIR · axial · 5.0mm · 0.45mm/px · z∈[-105,+37]mm · 2 of 25 slices shown]
[im 1/25]
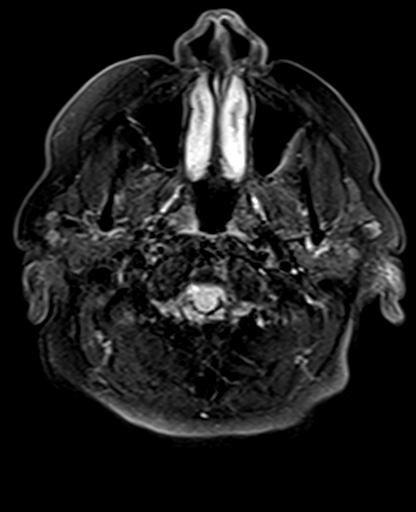
[im 25/25]
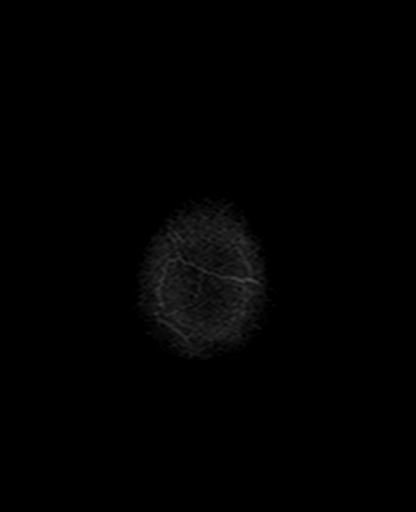

[Series 12: mag_images · axial · 3.0mm · 0.90mm/px · z∈[-110,+42]mm · 4 of 52 slices shown]
[im 1/52]
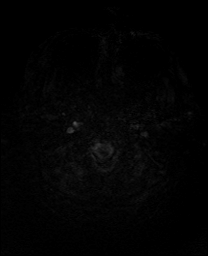
[im 18/52]
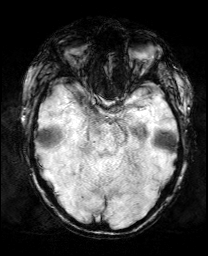
[im 35/52]
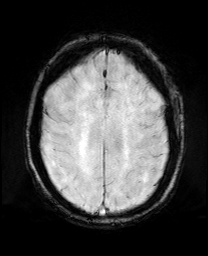
[im 52/52]
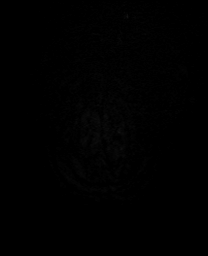

[Series 13: pha_images · axial · 3.0mm · 0.90mm/px · z∈[-110,+42]mm · 4 of 52 slices shown]
[im 1/52]
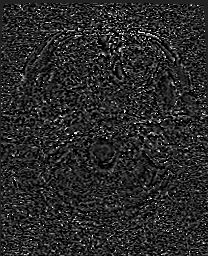
[im 18/52]
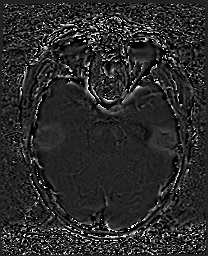
[im 35/52]
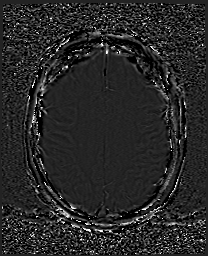
[im 52/52]
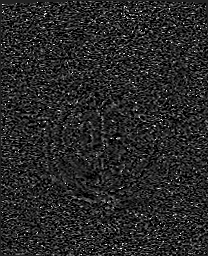

[Series 14: swi_images · axial · 3.0mm · 0.90mm/px · z∈[-110,+42]mm · 4 of 52 slices shown]
[im 1/52]
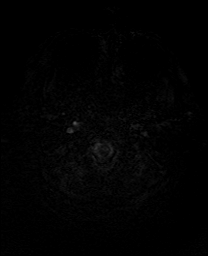
[im 18/52]
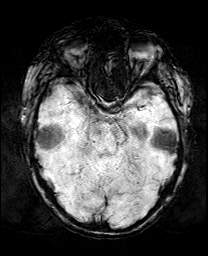
[im 35/52]
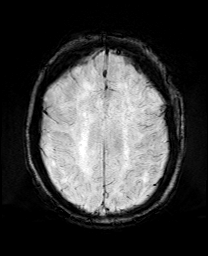
[im 52/52]
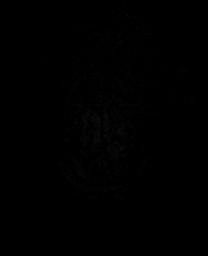

[Series 15: mip_images(sw) · axial · 24.0mm · 0.90mm/px · z∈[-99,+31]mm · 3 of 45 slices shown]
[im 1/45]
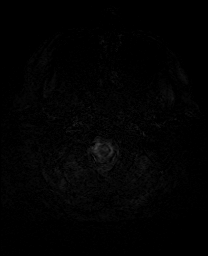
[im 23/45]
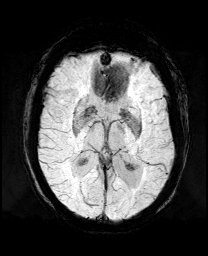
[im 45/45]
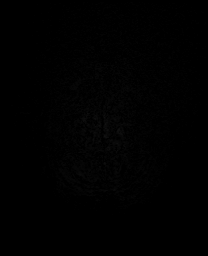

[Series 17: T2 · coronal · 5.0mm · 0.34mm/px · 2 of 29 slices shown (2 of 2)]
[im 1/29]
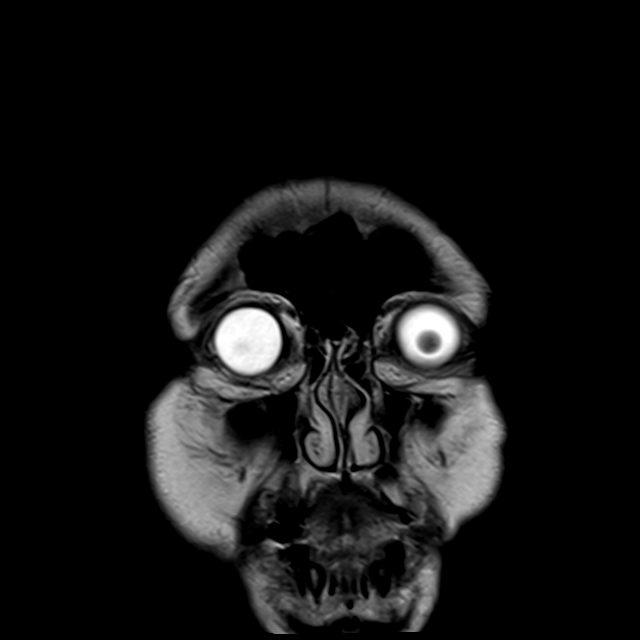
[im 29/29]
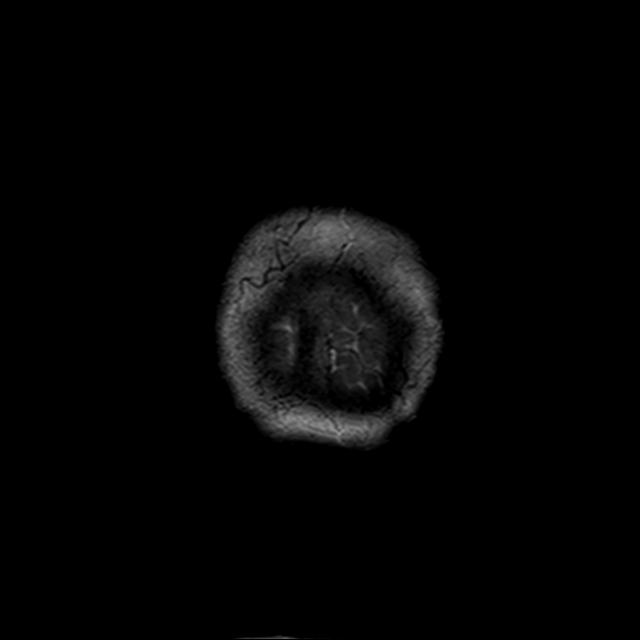

[44 of 48 positions shown; findings below may reference images not displayed]

FINDINGS: Brain: No acute infarction, hemorrhage, hydrocephalus, extra-axial
collection or mass lesion. Moderate chronic small vessel ischemia.
Cerebral volume loss in keeping with aging.

Vascular: Normal flow voids

Skull and upper cervical spine: Normal marrow signal. Notable facet
osteoarthritis in the visible cervical spine with C3-4 ankylosis.

Sinuses/Orbits: Negative
IMPRESSION: 1. No acute or reversible finding.
2. Moderate chronic small vessel ischemia.

## 2023-02-05 ENCOUNTER — Other Ambulatory Visit (HOSPITAL_COMMUNITY): Payer: Self-pay

## 2023-02-17 ENCOUNTER — Other Ambulatory Visit: Payer: Self-pay | Admitting: Cardiovascular Disease

## 2023-04-01 ENCOUNTER — Ambulatory Visit: Payer: Medicare Other | Admitting: Podiatry

## 2023-04-01 DIAGNOSIS — L600 Ingrowing nail: Secondary | ICD-10-CM

## 2023-04-01 NOTE — Progress Notes (Signed)
  Subjective:  Patient ID: PEYTYN TRINE, female    DOB: Aug 13, 1946,  MRN: [id number]  Chief Complaint  Patient presents with   Nail Problem    Bilateral ingrown nails pt states she usually just comes in to get them cut out without procedure     77 y.o. female presents with the above complaint. History confirmed with patient.  Her A1c is 7.4%.  Pain started to return.  Previous debridement has been helpful   Objective:  Physical Exam: warm, good capillary refill, no trophic changes or ulcerative lesions, normal DP and PT pulses, normal monofilament exam, normal sensory exam, and she has mild ingrowing left hallux nail medial border, some erythema but there is no paronychia no drainage no signs of infection.  Right hallux also tender medial, remaining nails thickened and elongated.  Assessment:   1. Ingrowing left great toenail   2. Ingrowing right great toenail      Plan:  Patient was evaluated and treated and all questions answered.  Debridement of the offending borders of the nailbeds were performed.  This been sufficient in alleviating her pain and issues without recurrence.  She would like to hold off on permanent matricectomy for now.  Remaining nails were trimmed in length and thickness using a sharp nail nipper.  Return as needed if this worsens.  Return if symptoms worsen or fail to improve.

## 2023-05-24 ENCOUNTER — Emergency Department (HOSPITAL_COMMUNITY): Payer: Medicare Other

## 2023-05-24 ENCOUNTER — Other Ambulatory Visit: Payer: Self-pay

## 2023-05-24 ENCOUNTER — Emergency Department (HOSPITAL_COMMUNITY)
Admission: EM | Admit: 2023-05-24 | Discharge: 2023-05-24 | Disposition: A | Discharge status: Home / Self Care | Payer: Medicare Other | Attending: Emergency Medicine | Admitting: Emergency Medicine

## 2023-05-24 DIAGNOSIS — R1013 Epigastric pain: Secondary | ICD-10-CM | POA: Insufficient documentation

## 2023-05-24 DIAGNOSIS — Z79899 Other long term (current) drug therapy: Secondary | ICD-10-CM | POA: Diagnosis not present

## 2023-05-24 DIAGNOSIS — Z7982 Long term (current) use of aspirin: Secondary | ICD-10-CM | POA: Diagnosis not present

## 2023-05-24 DIAGNOSIS — E119 Type 2 diabetes mellitus without complications: Secondary | ICD-10-CM | POA: Diagnosis not present

## 2023-05-24 DIAGNOSIS — Z794 Long term (current) use of insulin: Secondary | ICD-10-CM | POA: Diagnosis not present

## 2023-05-24 DIAGNOSIS — Z7984 Long term (current) use of oral hypoglycemic drugs: Secondary | ICD-10-CM | POA: Diagnosis not present

## 2023-05-24 DIAGNOSIS — I10 Essential (primary) hypertension: Secondary | ICD-10-CM | POA: Insufficient documentation

## 2023-05-24 DIAGNOSIS — Z91040 Latex allergy status: Secondary | ICD-10-CM | POA: Diagnosis not present

## 2023-05-24 LAB — CBC
HCT: 44.1 % (ref 36.0–46.0)
Hemoglobin: 14.4 g/dL (ref 12.0–15.0)
MCH: 29.6 pg (ref 26.0–34.0)
MCHC: 32.7 g/dL (ref 30.0–36.0)
MCV: 90.6 fL (ref 80.0–100.0)
Platelets: 301 10*3/uL (ref 150–400)
RBC: 4.87 MIL/uL (ref 3.87–5.11)
RDW: 12.9 % (ref 11.5–15.5)
WBC: 9.4 10*3/uL (ref 4.0–10.5)
nRBC: 0 % (ref 0.0–0.2)

## 2023-05-24 LAB — COMPREHENSIVE METABOLIC PANEL
ALT: 17 U/L (ref 0–44)
AST: 18 U/L (ref 15–41)
Albumin: 4 g/dL (ref 3.5–5.0)
Alkaline Phosphatase: 56 U/L (ref 38–126)
Anion gap: 9 (ref 5–15)
BUN: 15 mg/dL (ref 8–23)
CO2: 25 mmol/L (ref 22–32)
Calcium: 9 mg/dL (ref 8.9–10.3)
Chloride: 100 mmol/L (ref 98–111)
Creatinine, Ser: 0.81 mg/dL (ref 0.44–1.00)
GFR, Estimated: >60 mL/min (ref >60)
Glucose, Bld: 131 mg/dL — ABNORMAL HIGH (ref 70–99)
Potassium: 4 mmol/L (ref 3.5–5.1)
Sodium: 134 mmol/L — ABNORMAL LOW (ref 135–145)
Total Bilirubin: 1 mg/dL (ref 0.3–1.2)
Total Protein: 7.4 g/dL (ref 6.5–8.1)

## 2023-05-24 LAB — URINALYSIS, ROUTINE W REFLEX MICROSCOPIC
Bilirubin Urine: NEGATIVE
Glucose, UA: NEGATIVE mg/dL
Ketones, ur: NEGATIVE mg/dL
Nitrite: NEGATIVE
Protein, ur: NEGATIVE mg/dL
Specific Gravity, Urine: 1.01 (ref 1.005–1.030)
Squamous Epithelial / HPF: >50 /HPF (ref 0–5)
pH: 7 (ref 5.0–8.0)

## 2023-05-24 LAB — LIPASE, BLOOD: Lipase: 33 U/L (ref 11–51)

## 2023-05-24 LAB — CBG MONITORING, ED: Glucose-Capillary: 91 mg/dL (ref 70–99)

## 2023-05-24 NOTE — ED Triage Notes (Signed)
Pt arrives POV from home. Pt c/o upper abd pain / epigastric pain x4 days. Pt states she has a hx of diverticulitis, GERD, and IBS. Pt also endorses diarrhea but denies any N/V. Pt states she has been able to eat and drink at home.

## 2023-05-24 NOTE — ED Provider Notes (Signed)
[place] EMERGENCY DEPARTMENT AT [facility] Provider Note   CSN: [id number] Arrival date & time: 05/24/23  1140     History No chief complaint on file.   Ruth [name] is a 77 y.o. female.  Patient with a past history significant for diabetes, hypertension, rectal incontinence, hyperlipidemia, bloating, family history of colon cancer presents the emergency department concerns of a swollen stomach.  Reports has been ongoing with associate epigastric pain for the last 4 days.  Also endorses significant right-sided abdominal pain.  She has a history of diverticulitis.  Patient states she has had some nausea but no vomiting and also some mild diarrhea.  Reports that she still eating and drinking without any difficulty.  No recent fevers.  HPI     Home Medications Prior to Admission medications   Medication Sig Start Date End Date Taking? Authorizing Provider  amLODipine (NORVASC) 10 MG tablet Take 1 tablet (10 mg) by mouth every morning. 07/14/22   Lennette Bihari, MD  amLODipine (NORVASC) 5 MG tablet TAKE 1 TABLET BY MOUTH TWICE DAILY 02/17/23   Lennette Bihari, MD  aspirin EC 81 MG tablet Take 81 mg by mouth daily.    [provider]  BD PEN NEEDLE NANO 2ND GEN 32G X 4 MM MISC USE THREE TIMES DAILY AS DIRECTED WITH HUMALOG AND TOUJEO 06/04/20   [provider]  calcium carbonate (TUMS - DOSED IN MG ELEMENTAL CALCIUM) 500 MG chewable tablet Chew 1-2 tablets by mouth 3 (three) times daily as needed for heartburn.     [provider]  cholecalciferol (VITAMIN D) 1000 UNITS tablet Take 1,000 Units by mouth daily.    [provider]  CRANBERRY-VITAMIN C PO Take 1 tablet by mouth daily.    [provider]  CREON 36000-114000 units CPEP capsule Take 36,000 Units by mouth.    [provider]  EPINEPHrine 0.3 mg/0.3 mL IJ SOAJ injection Inject 0.3 mg into the muscle as needed. 10/05/19   [provider]  HUMALOG KWIKPEN  100 UNIT/ML KwikPen Inject 5 Units into the skin in the morning and at bedtime. 06/17/20   [provider]  Insulin Glargine (TOUJEO MAX SOLOSTAR [place]) Inject 50 Units into the skin daily.     [provider]  insulin glargine, 1 Unit Dial, (TOUJEO SOLOSTAR) 300 UNIT/ML Solostar Pen Inject 50 Units into the skin daily. At lunch time.    [provider]  Lancets (ONETOUCH DELICA PLUS LANCET30G) MISC USE TO CHECK BLOOD SUGAR FOUR TIMES DAILY 06/03/20   [provider]  metoprolol tartrate (LOPRESSOR) 100 MG tablet TAKE 1 TABLET(100 MG) BY MOUTH TWICE DAILY Patient taking differently: Take 100 mg by mouth 2 (two) times daily. 03/07/20   Lennette Bihari, MD  nitrofurantoin (MACRODANTIN) 100 MG capsule Take 100 mg by mouth daily. 11/21/21   [provider]  nitroGLYCERIN (NITROSTAT) 0.4 MG SL tablet Place 1 tablet (0.4 mg total) under the tongue every 5 (five) minutes as needed for chest pain (up to 3 doses). 08/21/19   Jodelle Gross, NP  [facility] VERIO test strip  07/23/20   [provider]  PRALUENT 75 MG/ML SOAJ ADMINISTER 1 ML UNDER THE SKIN EVERY 14 DAYS 09/14/22   Lennette Bihari, MD  prasugrel (EFFIENT) 10 MG TABS tablet TAKE 1 TABLET(10 MG) BY MOUTH DAILY 09/30/22   Lennette Bihari, MD  Probiotic Product (PROBIOTIC-10 PO) Take 1 capsule by mouth daily.    [provider]  valsartan (DIOVAN) 160 MG tablet Take 1 tablet (160 mg total) by mouth 2 (two) times daily. TAKE 1 TABLET(160 MG) BY MOUTH TWICE DAILY Strength: 160 mg 03/20/22   Ronney Asters, NP  vitamin B-12 (CYANOCOBALAMIN) 1000 MCG tablet Take 1,000 mcg by mouth daily.    [provider]      Allergies    Betadine [povidone iodine], Codeine, Demerol, Iohexol, Latex, Other, Percocet [oxycodone-acetaminophen], Plavix [clopidogrel bisulfate], Red dye #40 (allura red), Shellfish allergy, Sulfonamide derivatives, Tylenol [acetaminophen], Azilsartan, Azithromycin,  Barbiturates, Cardizem [diltiazem], Ciprofloxacin, Clonidine, Crestor [rosuvastatin], Ezetimibe, Fluogen [influenza virus vaccine], Furosemide, Hydralazine hcl, Hydrochlorothiazide, Miralax [polyethylene glycol], Omega-3-acid ethyl esters, Pravastatin sodium, Spironolactone, Statins, Sulfa antibiotics, Vancomycin, Warfarin, and Imdur [isosorbide nitrate]    Review of Systems   Review of Systems  Gastrointestinal:  Positive for abdominal pain.  All other systems reviewed and are negative.   Physical Exam Updated Vital Signs BP 135/60   Pulse 63   Temp 97.9 F (36.6 C)   Resp 20   SpO2 95%  Physical Exam Vitals and nursing note reviewed.  Constitutional:      General: She is not in acute distress.    Appearance: She is well-developed.  HENT:     Head: Normocephalic and atraumatic.  Eyes:     Conjunctiva/sclera: Conjunctivae normal.  Cardiovascular:     Rate and Rhythm: Normal rate and regular rhythm.     Heart sounds: No murmur heard. Pulmonary:     Effort: Pulmonary effort is normal. No respiratory distress.     Breath sounds: Normal breath sounds.  Abdominal:     Palpations: Abdomen is soft.     Tenderness: There is abdominal tenderness in the epigastric area.  Musculoskeletal:        General: No swelling.     Cervical back: Neck supple.  Skin:    General: Skin is warm and dry.     Capillary Refill: Capillary refill takes less than 2 seconds.  Neurological:     Mental Status: She is alert.  Psychiatric:        Mood and Affect: Mood normal.     ED Results / Procedures / Treatments   Labs (all labs ordered are listed, but only abnormal results are displayed) Labs Reviewed  COMPREHENSIVE METABOLIC PANEL - Abnormal; Notable for the following components:      Result Value   Sodium 134 (*)    Glucose, Bld 131 (*)    All other components within normal limits  URINALYSIS, ROUTINE W REFLEX MICROSCOPIC - Abnormal; Notable for the following components:   APPearance  CLOUDY (*)    Hgb urine dipstick SMALL (*)    Leukocytes,Ua MODERATE (*)    Bacteria, UA RARE (*)    All other components within normal limits  LIPASE, BLOOD  CBC  CBG MONITORING, ED  CBG MONITORING, ED    EKG EKG Interpretation Date/Time:  Monday May 24 2023 12:05:37 EDT Ventricular Rate:  65 PR Interval:  144 QRS Duration:  74 QT Interval:  442 QTC Calculation: 459 R Axis:   50  Text Interpretation: Normal sinus rhythm Normal ECG When compared with ECG of 30-Jun-2022 16:09, PREVIOUS ECG IS PRESENT Confirmed by Ernie Avena ([id number]) on 05/24/2023 1:48:25 PM  Radiology CT ABDOMEN PELVIS WO CONTRAST  Result Date: 05/24/2023 CLINICAL DATA:  Bowel obstruction suspected. Upper abdominal pain. Diarrhea. EXAM: CT ABDOMEN AND PELVIS WITHOUT CONTRAST TECHNIQUE: Multidetector CT imaging of the abdomen and pelvis was performed following the standard  protocol without IV contrast. RADIATION DOSE REDUCTION: This exam was performed according to the departmental dose-optimization program which includes automated exposure control, adjustment of the mA and/or kV according to patient size and/or use of iterative reconstruction technique. COMPARISON:  06/30/2022. FINDINGS: Lower chest: There are patchy atelectatic changes in the visualized lung bases. No overt consolidation. No pleural effusion. There are also bronchiectatic changes in the right lung lower lobe. The heart is normal in size. No pericardial effusion. Hepatobiliary: The liver is normal in size. Non-cirrhotic configuration. No suspicious mass. No intrahepatic or extrahepatic bile duct dilation. No calcified gallstones. Normal gallbladder wall thickness. No pericholecystic inflammatory changes. Pancreas: Unremarkable. No pancreatic ductal dilatation or surrounding inflammatory changes. Spleen: Within normal limits. No focal lesion. Adrenals/Urinary Tract: Adrenal glands are unremarkable. No suspicious renal mass. No hydronephrosis. No renal or  ureteric calculi. Unremarkable urinary bladder. Stomach/Bowel: No disproportionate dilation of the small or large bowel loops. No evidence of abnormal bowel wall thickening or inflammatory changes. The appendix is unremarkable. There are multiple diverticula mainly in the left hemi colon, without imaging signs of diverticulitis. Vascular/Lymphatic: No ascites or pneumoperitoneum. No abdominal or pelvic lymphadenopathy, by size criteria. No aneurysmal dilation of the major abdominal arteries. There are marked peripheral atherosclerotic vascular calcifications of the aorta and its major branches. Reproductive: The uterus is surgically absent. No large adnexal mass. Other: There is herniation of portion of the distal small bowel loop in the umbilical hernia without proximal obstruction or radiographic signs of strangulation. The soft tissues and abdominal wall are otherwise unremarkable. Musculoskeletal: No suspicious osseous lesions. There are mild multilevel degenerative changes in the visualized spine. IMPRESSION: 1. No bowel obstruction. No acute inflammatory process identified within the abdomen or pelvis. 2. Colonic diverticulosis without imaging signs of diverticulitis. 3. Umbilical hernia containing a portion of the distal small bowel loop without proximal obstruction or radiographic signs of strangulation. Aortic Atherosclerosis (ICD10-I70.0). Electronically Signed   By: Jules Schick M.D.   On: 05/24/2023 16:23    Procedures Procedures   Medications Ordered in ED Medications - No data to display  ED Course/ Medical Decision Making/ A&P                               Medical Decision Making Amount and/or Complexity of Data Reviewed Labs: ordered. Radiology: ordered.   This patient presents to the ED for concern of abdominal pain.  Differential diagnosis includes pancreatitis, bowel obstruction, diverticulitis, pyelonephritis    Lab Tests:  I Ordered, and personally interpreted labs.  The  pertinent results include: CBC without evidence of anemia or infection, lipase normal, CMP with only mild hyponatremia at 134, CBGs are unremarkable, urinalysis with possible signs of infection although patient is asymptomatic   Imaging Studies ordered:  I ordered imaging studies including CT abdomen pelvis I independently visualized and interpreted imaging which showed no evidence of diverticulitis, bowel obstruction or any other acute process, colonic diverticulosis without diverticulitis, umbilical hernia which is fat-containing and does not appear to be strangulated I agree with the radiologist interpretation   Problem List / ED Course:  Patient presents to the emergency department concerns of abdominal pain and abdominal swelling.  Reports this been ongoing issue for the last 4 days without significant improvement.  States she has a history of diverticulitis, GERD and IBS.  Also reports that she has had some brief diarrhea but denies any nausea or vomiting.  Reports that she is still  tolerating oral intake without any difficulty.  Given prior history and level of tenderness, will evaluate CT imaging of the pelvis.  Given the patient has significant allergies listed with allergies as she is reporting to contrast media as well, will do CT abdomen pelvis without contrast. Lab work was thankfully unremarkable.  No obvious signs of abnormalities that would account for symptoms.  Possible UTI but patient is currently asymptomatic. Informed patient of reassuring findings however given patient's notable allergies with anaphylactic reactions of significant pain medications, differentiating medications available for.  Discussed case with pharmacist who also expresses some concerns about administering medications given no anaphylactic history. Patient is concerned about significant pain that she cannot control well.  States that she does not want any pain medications given allergies wants to be seen by GI.   Advised patient that given reassuring findings, GI likely would recommend outpatient follow-up for patient.  However patient was persistent with request so consultation made to gastroenterology. Spoke with Dr. [name], gastroenterology, who advised outpatient follow up as no acute indication for any scope.  Informed patient of these recommendations patient is somewhat dissatisfied with this.  However provided patient with contact information for [facility] to reach out to schedule appointment as she is now frustrated and requesting to leave. No acute emergent concern at this time for admission but concerned regarding pain control that patient will not manage well. Discussed strict return precautions. All questions answered prior to patient discharge.  Final Clinical Impression(s) / ED Diagnoses Final diagnoses:  Epigastric pain    Rx / DC Orders ED Discharge Orders     None         Salomon Mast 05/24/23 2229    Ernie Avena, MD 05/25/23 0800

## 2023-05-24 NOTE — Discharge Instructions (Addendum)
You were seen today for abdominal pain and swelling. Your labs and imaging were reassuring today with no specific abnormality seen. I spoke with [facility] regarding your care and they advised that they will see you in their office, but you will need to schedule that appointment with them. I have provided you with their contact information. If your symptoms are worsening, please return to the ER. You can try a course of omeprazole or pantoprazole but given your allergy history and having never taken these, I would be cautious with this.

## 2023-05-24 NOTE — ED Notes (Signed)
Pt states she takes blood thinners but denies any blood in stool.

## 2023-05-24 NOTE — ED Notes (Signed)
Patient transported to CT 

## 2023-05-25 ENCOUNTER — Ambulatory Visit: Payer: Medicare Other | Attending: Cardiovascular Disease | Admitting: Physician Assistant

## 2023-05-25 ENCOUNTER — Encounter: Payer: Self-pay | Admitting: Physician Assistant

## 2023-05-25 ENCOUNTER — Ambulatory Visit: Payer: Medicare Other | Admitting: Cardiovascular Disease

## 2023-05-25 VITALS — BP 164/66 | HR 65 | Ht 61.5 in | Wt 179.0 lb

## 2023-05-25 DIAGNOSIS — I10 Essential (primary) hypertension: Secondary | ICD-10-CM

## 2023-05-25 DIAGNOSIS — R1030 Lower abdominal pain, unspecified: Secondary | ICD-10-CM | POA: Diagnosis not present

## 2023-05-25 DIAGNOSIS — I25709 Atherosclerosis of coronary artery bypass graft(s), unspecified, with unspecified angina pectoris: Secondary | ICD-10-CM | POA: Diagnosis not present

## 2023-05-25 DIAGNOSIS — R0789 Other chest pain: Secondary | ICD-10-CM

## 2023-05-25 DIAGNOSIS — E119 Type 2 diabetes mellitus without complications: Secondary | ICD-10-CM

## 2023-05-25 DIAGNOSIS — E785 Hyperlipidemia, unspecified: Secondary | ICD-10-CM

## 2023-05-25 DIAGNOSIS — I119 Hypertensive heart disease without heart failure: Secondary | ICD-10-CM

## 2023-05-25 DIAGNOSIS — Z794 Long term (current) use of insulin: Secondary | ICD-10-CM

## 2023-05-25 LAB — CBG MONITORING, ED
Glucose-Capillary: 103 mg/dL — ABNORMAL HIGH (ref 70–99)
Glucose-Capillary: 105 mg/dL — ABNORMAL HIGH (ref 70–99)

## 2023-05-25 NOTE — Progress Notes (Unsigned)
Cardiology Office Note:  .   Date:  05/27/2023  ID:  Ruth [name], DOB 1946-04-12, MRN [id number] PCP: Chilton Si, MD  [facility] Providers Cardiologist:  [name], MD     History of Present Illness: .   Ruth [name] is a 77 y.o. female with a hx of CAD s/p CABG x4 in 2005-07-31 at [facility] by Dr. Riley Churches (LIMA-LAD, SVG-diagonal, SVG-OM, and SVG-RCA), HTN, HLD and DM 2.  She moved to [place] area in 08/01/11 after her husband died.  In Jul 31, 2012, she underwent cardiac catheterization and underwent DES to RCA.  She is a former patient of [name].  Echocardiogram in 07-31-2013 showed normal EF, mild LVH, mild to moderate MR, mild TR and PA pressure 39 mmHg.  She has many allergies including amlodipine and spironolactone.  She also has history of labile hypertension.  In the past, she became dehydrated on diuretic therapy.  Echocardiogram in July 2016 showed EF 55 to 60%, mild MR, mild LAE, PA pressure 32 mmHg.    Myoview obtained on 03/21/2015 was low risk, very small region of mild ischemia in the basal anterolateral wall, EF 65%.  Repeat cardiac catheterization in June 2018 revealed EF 55 to 60%, significant multivessel CAD with 70% diffuse proximal LAD lesion, total occlusion of left circumflex and OM1, 75% proximal RCA lesion with occluded RCA prior to acute marginal.  She had patent LIMA to mid LAD, SVG to distal left circumflex marginal had small to 60% mid body stenosis with TIMI-3 flow, SVG to distal RCA has 60% stenosis followed by 99% thrombotic stenosis beyond a stented segment.  She underwent successful PCI to SVG to RCA with insertion of 3.5 x 32 mm Synergy DES postdilated to 3.71 mm.  It was recommended she continue dual antiplatelet therapy indefinitely.  She takes Effient but does not tolerate Plavix or Brilinta.  She is intolerant of statins as well.  Repeat cardiac catheterization on 06/22/2019 showed restenosis of the previously placed stent to SVG to RCA  and also vein graft to left circumflex, she eventually underwent staged intervention with stenting of SVG to OM 1 and PTCA for in-stent restenosis of SVG to RCA.  Her anginal symptom was abdominal pain.   I last saw the patient in September 2023 for chest discomfort.  Myoview obtained in October 2023 was a low risk study with EF 66%, small sized moderate intensity fixed basal to mid inferolateral perfusion defect likely artifact, no significant reversible blockage.  She has underwent evaluation at [facility] in 07-31-2022 for suspected mesenteric ischemia.  She was seen by [facility] vascular surgery service who felt her symptom was not typical of mesenteric ischemia symptom.  Patient was last seen by Dr. Tresa Endo in 2023-11-24at which time she was doing well.  Her abdominal bloating symptom has improved with Gas-X.  She was seen at [facility] ED on 01/16/2023 due to left arm pain and shoulder pain that is worse with movement.  This was felt to be musculoskeletal in etiology.  Outpatient MRI was recommended.  Troponin was normal.  EKG showed no evidence of ischemia.  Lactic acid also normal.  She presented back to the ED yesterday due to abdominal swelling with associated epigastric pain for 4 days.  She also had significant right-sided abdominal discomfort.  Lipase was normal.  Urinalysis shows small hemoglobin, moderate leukocyte and rare bacteria.  Renal function and electrolyte normal.  Red blood cell count normal.  Abdominal CT showed no evidence of bowel obstruction, no acute inflammatory process colonic diverticulosis without diverticulitis, umbilical hernia containing a portion of distal small bowel loop without proximal obstruction or strangulation.  Patient did not want any pain medications due to allergies and wanted to be seen by GI.  The case was discussed with GI service who recommended follow-up as outpatient with Dr. [name] given reassuring imaging.  Patient presents today for  evaluation of epigastric pain radiating to the chest.  The pain has been going on since last Thursday.  Epigastric pain is constant.  It is worse with deep palpation, deep inspiration and body rotation.  The symptom was also accompanied by burping as well.  I suspect this is more GI in nature.  She says she has 10 out of 10 abdominal pain since last Thursday.  EKG shows no significant ischemic changes.  I discussed her case with DOD Dr. [name], my suspicion is her symptom is more likely to be GI rather than cardiac even though her previous anginal symptom was abdominal pain as well.  Patient was quite tearful in the room.  I will obtain a Lexiscan Myoview to rule out cardiac cause.  She also complaining of bilateral lower extremity pain going on for 6 months that is worse with ambulation.  She is at high risk for PAD.  I will obtain a nonurgent ABI.  She need to see GI service as soon as possible.   ROS:   Patient complains of significant abdominal discomfort, she denies any chest pain or shortness of breath.  She has no significant dizziness.  Studies Reviewed: .        Cardiac Studies & Procedures   CARDIAC CATHETERIZATION  CARDIAC CATHETERIZATION 06/23/2019  Narrative  SVG.  Prox Graft to Mid Graft lesion is 90% stenosed.  A drug-eluting stent was successfully placed using a STENT SYNERGY DES 3.5X32.  Post intervention, there is a 0% residual stenosis.  SVG.  Dist Graft lesion is 95% stenosed.  Balloon angioplasty was performed using a BALLOON SAPPHIRE [place] 3.5X15.  Post intervention, there is a 0% residual stenosis.  1. Successful PCI of the SVG to OM with DES x 1 2. Successful PCI of the SVG to RCA with POBA.  Plan: DAPT for at least one year and I would consider indefinitely. Anticipate DC in am if stable.  Findings Coronary Findings Diagnostic  Dominance: Right  Left Main The left main was normal and bifurcated into the LAD and left circumflex vessel.  Left  Anterior Descending The LAD had diffuse 70% proximal stenosis.  A flush and fill phenomena was seen after the takeoff of the second diagonal vessel due to competitive LIMA flow. Ost LAD to Prox LAD lesion is 70% stenosed. Mid LAD lesion is 90% stenosed.  Left Circumflex The left circumflex and OM1 branch were occluded proximally. Prox Cx to Mid Cx lesion is 100% stenosed.  First Obtuse Marginal Branch 1st Mrg lesion is 100% stenosed.  Right Coronary Artery The RCA had diffuse 75% proximal stenosis.  After the RV marginal branch, the RCA was occluded before the acute margin. Prox RCA lesion is 75% stenosed. Mid RCA lesion is 100% stenosed.  Right Ventricular Branch RV Branch lesion is 90% stenosed.  Right Posterior Descending Artery RPDA lesion is 70% stenosed.  Second Right Posterolateral Branch 2nd RPL lesion is 75% stenosed.  Saphenous Graft To 2nd Mrg SVG. Prox Graft to Mid Graft lesion is 90% stenosed.  Sequential LIMA LIMA Graft To Suezanne Jacquet  2nd Diag, Dist LAD LIMA.  Saphenous Graft To Ost RPDA SVG. Non-stenotic Mid Graft lesion was previously treated. Non-stenotic Dist Graft lesion was previously treated. The lesion is thrombotic.  Graft To RPDA Mid Graft lesion is 20% stenosed. The lesion was previously treated. Dist Graft lesion is 95% stenosed. The lesion was previously treated.  Sequential LIMA Graft To 1st Diag, Dist LAD  Intervention  Prox Graft to Mid Graft lesion (Saphenous Graft To 2nd Mrg) Stent CATHETER LAUNCHER 6FR AL1 guide catheter was inserted. Lesion crossed with guidewire using a WIRE ASAHI PROWATER 180CM. Pre-stent angioplasty was not performed. A drug-eluting stent was successfully placed using a STENT SYNERGY DES 3.5X32. Maximum pressure: 14 atm. Stent strut is well apposed. Post-stent angioplasty was not performed. Post-Intervention Lesion Assessment The intervention was successful. Embolic protection device was deployed. Pre-interventional TIMI  flow is 3. Post-intervention TIMI flow is 3. No complications occurred at this lesion. There is a 0% residual stenosis post intervention.  Dist Graft lesion (Graft To RPDA) Angioplasty CATHETER LAUNCHER 6FR MP1 guide catheter was inserted. WIRE ASAHI PROWATER 180CM guidewire used to cross lesion. Balloon angioplasty was performed using a BALLOON SAPPHIRE [place] 3.5X15. Maximum pressure: 16 atm. Post-Intervention Lesion Assessment The intervention was successful. Embolic protection device was not deployed. Pre-interventional TIMI flow is 3. Post-intervention TIMI flow is 3. No complications occurred at this lesion. There is a 0% residual stenosis post intervention.   CARDIAC CATHETERIZATION 06/22/2019  Narrative  Bypass graft failure with high-grade obstruction and SVG to RCA (second restenosis.  90% saphenous vein graft to the obtuse marginal.  Patent sequential LIMA to diagonal and LAD.  Occluded mid right coronary  Occluded circumflex  70% proximal LAD and 90% mid LAD.  Occluded second diagonal.  Normal LV function with elevated LVEDP.  Estimated ejection fraction 65%   RECOMMENDATIONS:   IV heparin to start in 4 hours.  IV nitroglycerin if recurrent chest pain.  IV normal saline.  After speaking with Dr. Nicholaus Bloom, will pause the case and decide if repeat CABG is about option then repeated stenting of 79 year old grafts.  Findings Coronary Findings Diagnostic  Dominance: Right  Left Main The left main was normal and bifurcated into the LAD and left circumflex vessel.  Left Anterior Descending The LAD had diffuse 70% proximal stenosis.  A flush and fill phenomena was seen after the takeoff of the second diagonal vessel due to competitive LIMA flow. Ost LAD to Prox LAD lesion is 70% stenosed. Mid LAD lesion is 90% stenosed.  Left Circumflex The left circumflex and OM1 branch were occluded proximally. Prox Cx to Mid Cx lesion is 100% stenosed.  First Obtuse Marginal  Branch 1st Mrg lesion is 100% stenosed.  Right Coronary Artery The RCA had diffuse 75% proximal stenosis.  After the RV marginal branch, the RCA was occluded before the acute margin. Prox RCA lesion is 75% stenosed. Mid RCA lesion is 100% stenosed.  Right Ventricular Branch RV Branch lesion is 90% stenosed.  Right Posterior Descending Artery RPDA lesion is 70% stenosed.  Second Right Posterolateral Branch 2nd RPL lesion is 75% stenosed.  Saphenous Graft To 2nd Mrg SVG. Prox Graft to Mid Graft lesion is 90% stenosed.  Sequential LIMA LIMA Graft To Ost 2nd Diag, Dist LAD LIMA.  Saphenous Graft To Ost RPDA SVG. Previously placed Mid Graft drug eluting stent is widely patent. Previously placed Dist Graft drug eluting stent is widely patent. The lesion is thrombotic.  Graft To RPDA Mid Graft lesion is 20% stenosed.  The lesion was previously treated. Dist Graft lesion is 95% stenosed. The lesion was previously treated.  Sequential LIMA Graft To 1st Diag, Dist LAD  Intervention  No interventions have been documented.   STRESS TESTS  MYOCARDIAL PERFUSION IMAGING 05/27/2023  Narrative   Findings are consistent with no ischemia and no infarction. The study is low risk.   No ST deviation was noted.   LV perfusion is normal. There is no evidence of ischemia. There is no evidence of infarction.   Left ventricular function is abnormal. Nuclear stress EF: 67%. The left ventricular ejection fraction is hyperdynamic (>65%). End diastolic cavity size is normal. End systolic cavity size is normal.   Prior study available for comparison from 06/08/2022.   ECHOCARDIOGRAM  ECHOCARDIOGRAM COMPLETE 06/22/2019  Narrative ECHOCARDIOGRAM REPORT    Patient Name:   Ruth [name] Date of Exam: 06/22/2019 Medical Rec #:  [id number]         Height:       64.0 in Accession #:    [id number]        Weight:       185.0 lb Date of Birth:  07-17-1946          BSA:          1.89 m Patient  Age:    73 years          BP:           169/91 mmHg Patient Gender: F                 HR:           68 bpm. Exam Location:  Inpatient  Procedure: 2D Echo, Cardiac Doppler and Color Doppler  Indications:    Chest pain  History:        Patient has prior history of Echocardiogram examinations, most recent 03/13/2015. CAD; Prior CABG Risk Factors:Hypertension, Dyslipidemia and Diabetes.  Sonographer:    Lavenia Atlas Referring Phys: [id number] LAURA R INGOLD  IMPRESSIONS   1. Left ventricular ejection fraction, by visual estimation, is 60 to 65%. The left ventricle has normal function. Normal left ventricular size. Left ventricular septal wall thickness was mildly increased. Mildly increased left ventricular posterior wall thickness. There is mildly increased left ventricular hypertrophy. 2. Left ventricular diastolic Doppler parameters are consistent with impaired relaxation pattern of LV diastolic filling. 3. Global right ventricle has normal systolic function.The right ventricular size is normal. No increase in right ventricular wall thickness. 4. Left atrial size was normal. 5. Right atrial size was normal. 6. Mild calcification of the anterior mitral valve leaflet(s). 7. Mild thickening of the anterior mitral valve leaflet(s). 8. The mitral valve is normal in structure. Trace mitral valve regurgitation. No evidence of mitral stenosis. 9. The tricuspid valve is normal in structure. Tricuspid valve regurgitation is trivial. 10. The aortic valve is normal in structure. Aortic valve regurgitation was not visualized by color flow Doppler. Mild aortic valve sclerosis without stenosis. 11. Mildly calcified and thickened aortic valve leaflets. 12. The pulmonic valve was normal in structure. Pulmonic valve regurgitation is not visualized by color flow Doppler. 13. Mildly elevated pulmonary artery systolic pressure. 14. The inferior vena cava is normal in size with greater than 50% respiratory  variability, suggesting right atrial pressure of 3 mmHg.  FINDINGS Left Ventricle: Left ventricular ejection fraction, by visual estimation, is 60 to 65%. The left ventricle has normal function. Left ventricular septal wall thickness was mildly increased. Mildly increased left ventricular posterior  wall thickness. There is mildly increased left ventricular hypertrophy. Normal left ventricular size. Spectral Doppler shows Left ventricular diastolic Doppler parameters are consistent with impaired relaxation pattern of LV diastolic filling. Indeterminate filling pressures.  Right Ventricle: The right ventricular size is normal. No increase in right ventricular wall thickness. Global RV systolic function is has normal systolic function. The tricuspid regurgitant velocity is 2.67 m/s, and with an assumed right atrial pressure of 3 mmHg, the estimated right ventricular systolic pressure is mildly elevated at 31.5 mmHg.  Left [facility]: Left atrial size was normal in size.  Right [facility]: Right atrial size was normal in size  Pericardium: There is no evidence of pericardial effusion.  Mitral Valve: The mitral valve is normal in structure. There is mild thickening of the anterior mitral valve leaflet(s). There is mild calcification of the anterior mitral valve leaflet(s). No evidence of mitral valve stenosis by observation. Trace mitral valve regurgitation.  Tricuspid Valve: The tricuspid valve is normal in structure. Tricuspid valve regurgitation is trivial by color flow Doppler.  Aortic Valve: The aortic valve is normal in structure. Aortic valve regurgitation was not visualized by color flow Doppler. Mild aortic valve sclerosis is present, with no evidence of aortic valve stenosis. Mildly calcified and thickened aortic valve leaflets.  Pulmonic Valve: The pulmonic valve was normal in structure. Pulmonic valve regurgitation is not visualized by color flow Doppler.  Aorta: The aortic root, ascending  aorta and aortic arch are all structurally normal, with no evidence of dilitation or obstruction.  Venous: The inferior vena cava is normal in size with greater than 50% respiratory variability, suggesting right atrial pressure of 3 mmHg.  IAS/Shunts: No atrial level shunt detected by color flow Doppler. No ventricular septal defect is seen or detected. There is no evidence of an atrial septal defect.    LEFT VENTRICLE PLAX 2D LVIDd:         4.20 cm  Diastology LVIDs:         3.20 cm  LV e' lateral:   5.77 cm/s LV PW:         1.20 cm  LV E/e' lateral: 12.5 LV IVS:        1.30 cm  LV e' medial:    6.20 cm/s LVOT diam:     2.00 cm  LV E/e' medial:  11.7 LV SV:         38 ml LV SV Index:   19.02 LVOT Area:     3.14 cm   RIGHT VENTRICLE RV Basal diam:  2.50 cm RV S prime:     3.70 cm/s TAPSE (M-mode): 2.0 cm  LEFT ATRIUM             Index       RIGHT ATRIUM           Index LA diam:        2.80 cm 1.48 cm/m  RA Area:     13.60 cm LA Vol (A2C):   27.2 ml 14.37 ml/m RA Volume:   32.60 ml  17.22 ml/m LA Vol (A4C):   28.0 ml 14.79 ml/m LA Biplane Vol: 29.1 ml 15.38 ml/m AORTIC VALVE LVOT Vmax:   92.00 cm/s LVOT Vmean:  56.800 cm/s LVOT VTI:    0.205 m  AORTA Ao Root diam: 2.80 cm  MITRAL VALVE                         TRICUSPID VALVE MV Area (PHT):  3.65 cm              TR Peak grad:   28.5 mmHg MV PHT:        60.32 msec            TR Vmax:        267.00 cm/s MV Decel Time: 208 msec MV E velocity: 72.40 cm/s  103 cm/s  SHUNTS MV A velocity: 104.00 cm/s 70.3 cm/s Systemic VTI:  0.20 m MV E/A ratio:  0.70        1.5       Systemic Diam: 2.00 cm   Chilton Si MD Electronically signed by Chilton Si MD Signature Date/Time: 06/22/2019/1:34:36 PM    Final             Risk Assessment/Calculations:            Physical Exam:   VS:  BP (!) 164/66 (BP Location: Left Arm, Patient Position: Sitting, Cuff Size: Large)   Pulse 65   Ht 5' 1.5" (1.562 m)   Wt  179 lb (81.2 kg)   SpO2 97%   BMI 33.27 kg/m    Wt Readings from Last 3 Encounters:  05/27/23 179 lb (81.2 kg)  05/25/23 179 lb (81.2 kg)  09/14/22 174 lb (78.9 kg)    GEN: Well nourished, well developed in no acute distress NECK: No JVD; No carotid bruits CARDIAC: RRR, no murmurs, rubs, gallops RESPIRATORY:  Clear to auscultation without rales, wheezing or rhonchi  ABDOMEN: Soft, non-tender, non-distended EXTREMITIES:  No edema; No deformity   ASSESSMENT AND PLAN: .    Chest pain: Patient actually have more of epigastric pain rather than chest pain.  Abdominal pain is her anginal equivalent.  Despite so, her symptom is accompanied by burping, constant abdominal pain for the past 4 days.  Therefore this is unlikely to be cardiac in nature.  Given similarity with previous angina, I will obtain Myoview  Abdominal pain: Constant abdominal pain for the past 4 days.  It is not affected by food.  The pain seems to be constant.  Her recently evaluated in the emergency room, ED physician recommended follow-up by GI service as outpatient.  CT of abdomen was negative for acute process.  During the office visit today, she continued to have significant abdominal pain.  I urged the patient to go back to the emergency room if her abdominal pain worsens.  She has a history of SMA stenosis as well.  CAD s/p CABG: Continue aspirin and metoprolol  Hypertension: Blood pressure elevated today, however patient has significant abdominal discomfort  Hyperlipidemia: On Praluent  DM2: Managed by primary care provider.       Dispo: Follow-up with GI service as soon as possible.  Follow-up with cardiology service in 6 to 8 weeks.  Signed, Azalee Course, PA

## 2023-05-25 NOTE — Patient Instructions (Signed)
Medication Instructions:  NO CHANGES *If you need a refill on your cardiac medications before your next appointment, please call your pharmacy*   Lab Work: NO LABS If you have labs (blood work) drawn today and your tests are completely normal, you will receive your results only by: MyChart Message (if you have MyChart) OR A paper copy in the mail If you have any lab test that is abnormal or we need to change your treatment, we will call you to review the results.   Testing/Procedures:[address] Your physician has requested that you have a lexiscan myoview. For further information please visit [email or web address] Please follow instruction sheet, as given.    Your physician has requested that you have an ankle brachial index (ABI). During this test an ultrasound and blood pressure cuff are used to evaluate the arteries that supply the arms and legs with blood. Allow thirty minutes for this exam. There are no restrictions or special instructions. [address]   Follow-Up: At [facility], you and your health needs are our priority.  As part of our continuing mission to provide you with exceptional heart care, we have created designated Provider Care Teams.  These Care Teams include your primary Cardiologist (physician) and Advanced Practice Providers (APPs -  Physician Assistants and Nurse Practitioners) who all work together to provide you with the care you need, when you need it.  We recommend signing up for the patient portal called "MyChart".  Sign up information is provided on this After Visit Summary.  MyChart is used to connect with patients for Virtual Visits (Telemedicine).  Patients are able to view lab/test results, encounter notes, upcoming appointments, etc.  Non-urgent messages can be sent to your provider as well.   To learn more about what you can do with MyChart, go to [email or web address].    Your next appointment:   6-8  week(s)  Provider:   [name], MD or Azalee Course, PA   Other Instructions IF ABDOMINAL PAIN WORSEN GO TO EMERGENCY ROOM. ALSO CALL G.I DOCTOR [phone] TO SCHEDULE SOONER APPOINTMENT.    Azalee Course, [place]  has ordered a Lexiscan Myocardial Perfusion Imaging Study.  Please arrive 15 minutes prior to your appointment time for registration and insurance purposes.   The test will take approximately 3 to 4 hours to complete; you may bring reading material.  If someone comes with you to your appointment, they will need to remain in the main lobby due to limited space in the testing area. **If you are pregnant or breastfeeding, please notify the nuclear lab prior to your appointment**   How to prepare for your Myocardial Perfusion Test: Do not eat or drink 3 hours prior to your test, except you may have water. Do not consume products containing caffeine (regular or decaffeinated) 12 hours prior to your test. (ex: coffee, chocolate, sodas, tea). Do wear comfortable clothes (no dresses or overalls) and walking shoes, tennis shoes preferred (No heels or open toe shoes are allowed). Do NOT wear cologne, perfume, aftershave, or lotions (deodorant is allowed). If you use an inhaler, use it the AM of your test and bring it with you.  If you use a nebulizer, use it the AM of your test.  If these instructions are not followed, your test will have to be rescheduled.

## 2023-05-26 ENCOUNTER — Telehealth: Payer: Self-pay | Admitting: Cardiovascular Disease

## 2023-05-26 ENCOUNTER — Telehealth (HOSPITAL_COMMUNITY): Payer: Self-pay | Admitting: *Deleted

## 2023-05-26 NOTE — Telephone Encounter (Signed)
Patient wanted clarification on if she can take her medications the morning of her lexi scan tomorrow. She is aware she can take morning meds and will not need to stop caffeine 7pm the might before. She verbalized understanding

## 2023-05-26 NOTE — Telephone Encounter (Signed)
Reached pt and gave instructions for MPI.

## 2023-05-26 NOTE — Telephone Encounter (Signed)
Patient called to confirm if she should take her blood thinner medication and Aspirin prior to her test tomorrow.

## 2023-05-26 NOTE — Telephone Encounter (Signed)
Patient is calling with questions in regards to her Myocardial Perfusion test that is scheduled for tomorrow. Please advise.

## 2023-05-26 NOTE — Telephone Encounter (Signed)
Patient is aware she can take all her morning medications. She verbalized understanding

## 2023-05-27 ENCOUNTER — Ambulatory Visit (HOSPITAL_COMMUNITY): Payer: Medicare Other | Attending: Physician Assistant

## 2023-05-27 DIAGNOSIS — R0789 Other chest pain: Secondary | ICD-10-CM | POA: Diagnosis present

## 2023-05-27 LAB — MYOCARDIAL PERFUSION IMAGING
Base ST Depression (mm): 0 mm
LV dias vol: 58 mL (ref 46–106)
LV sys vol: 19 mL
Nuc Stress EF: 67 %
Peak HR: 81 {beats}/min
Rest HR: 62 {beats}/min
Rest Nuclear Isotope Dose: 10.9 mCi
SDS: 0
SRS: 0
SSS: 0
ST Depression (mm): 0 mm
Stress Nuclear Isotope Dose: 32.4 mCi
TID: 0.92

## 2023-05-27 MED ORDER — TECHNETIUM TC 99M TETROFOSMIN IV KIT
32.4000 | PACK | Freq: Once | INTRAVENOUS | Status: AC | PRN
  Administered 2023-05-27: 32.4 via INTRAVENOUS

## 2023-05-27 MED ORDER — AMINOPHYLLINE 25 MG/ML IV SOLN
75.0000 mg | Freq: Once | INTRAVENOUS | Status: AC
  Administered 2023-05-27: 75 mg via INTRAVENOUS

## 2023-05-27 MED ORDER — TECHNETIUM TC 99M TETROFOSMIN IV KIT
10.9000 | PACK | Freq: Once | INTRAVENOUS | Status: AC | PRN
  Administered 2023-05-27: 10.9 via INTRAVENOUS

## 2023-05-27 MED ORDER — REGADENOSON 0.4 MG/5ML IV SOLN
0.4000 mg | Freq: Once | INTRAVENOUS | Status: AC
  Administered 2023-05-27: 0.4 mg via INTRAVENOUS

## 2023-06-01 ENCOUNTER — Telehealth: Payer: Self-pay

## 2023-06-01 NOTE — Telephone Encounter (Addendum)
Called patient regarding results, patient had understanding of results.  ----- Message from Azalee Course sent at 05/28/2023  1:03 PM EDT ----- Low risk stress test, no sign of significant reversible blockage.

## 2023-06-04 ENCOUNTER — Telehealth: Payer: Self-pay | Admitting: Cardiovascular Disease

## 2023-06-04 NOTE — Telephone Encounter (Signed)
Patient is calling in regards with question concerning her procedure on 9/30. And also has questions about if its okay to take medication. Please advise

## 2023-06-04 NOTE — Telephone Encounter (Signed)
Spoke with pt regarding preparation for ABI/TBI duplex. Explained that there is no special instructions for this procedure, she can eat and drink like normal and take all her medications. Pt does mention that she was recently started on an antibiotic by her GI doctor. Added to pt's medication list. No further questions at this time.

## 2023-06-07 ENCOUNTER — Ambulatory Visit (HOSPITAL_COMMUNITY)
Admission: RE | Admit: 2023-06-07 | Discharge: 2023-06-07 | Disposition: A | Discharge status: Home / Self Care | Payer: Medicare Other | Source: Ambulatory Visit | Attending: Internal Medicine | Admitting: Internal Medicine

## 2023-06-07 DIAGNOSIS — R1030 Lower abdominal pain, unspecified: Secondary | ICD-10-CM | POA: Diagnosis not present

## 2023-06-07 LAB — VAS US ABI WITH/WO TBI
Left ABI: 1.08
Right ABI: 1.08

## 2023-07-07 ENCOUNTER — Encounter (HOSPITAL_COMMUNITY): Payer: Medicare Other

## 2023-07-07 ENCOUNTER — Telehealth: Payer: Self-pay | Admitting: Cardiovascular Disease

## 2023-07-07 NOTE — Telephone Encounter (Signed)
Pt c/o medication issue:  1. Name of Medication:   PRALUENT 75 MG/ML SOAJ   2. How are you currently taking this medication (dosage and times per day)?   3. Are you having a reaction (difficulty breathing--STAT)?   4. What is your medication issue?     Caller Samara Deist) called to follow-up on patient's prior authorization for this medication for year 2025.  Caller stated can fax form to fax# [phone].  Caller noted current prior authorization will expire on 09/07/23. Caller stated can reference call# [phone].

## 2023-07-08 ENCOUNTER — Other Ambulatory Visit (HOSPITAL_COMMUNITY): Payer: Self-pay

## 2023-07-08 ENCOUNTER — Telehealth: Payer: Self-pay | Admitting: Pharmacy Technician

## 2023-07-08 NOTE — Telephone Encounter (Signed)
PA request has been Cancelled. New Encounter created for follow up. For additional info see Pharmacy Prior Auth telephone encounter from 07/08/23.   Too soon to submit PA- will try again in 2 weeks

## 2023-07-13 ENCOUNTER — Ambulatory Visit: Payer: Medicare Other | Attending: Physician Assistant | Admitting: Physician Assistant

## 2023-07-13 VITALS — BP 160/70 | HR 65 | Ht 61.0 in | Wt 181.0 lb

## 2023-07-13 DIAGNOSIS — I10 Essential (primary) hypertension: Secondary | ICD-10-CM | POA: Diagnosis not present

## 2023-07-13 DIAGNOSIS — E785 Hyperlipidemia, unspecified: Secondary | ICD-10-CM

## 2023-07-13 DIAGNOSIS — E119 Type 2 diabetes mellitus without complications: Secondary | ICD-10-CM | POA: Diagnosis not present

## 2023-07-13 DIAGNOSIS — I25810 Atherosclerosis of coronary artery bypass graft(s) without angina pectoris: Secondary | ICD-10-CM | POA: Diagnosis not present

## 2023-07-13 NOTE — Telephone Encounter (Signed)
Thank you for taking care of the prior authorization, I saw Mrs. Ruth [name] today, she was very eager to have this PA taken care off. [name], I called [employer] to check, earliest they can renew it is Dec 1st. Please forward to appropriate pool to address it then.   [name]

## 2023-07-13 NOTE — Progress Notes (Signed)
Cardiology Office Note:  .   Date:  07/13/2023  ID:  ABRISH ERNY, DOB Jul 30, 1946, MRN [id number] PCP: [name], MD  [facility] Providers Cardiologist:  [name], MD     History of Present Illness: .   Ruth [name] is a 77 y.o. female with a hx of CAD s/p CABG x4 in 08/05/05 at [facility] by Dr. Riley Churches (LIMA-LAD, SVG-diagonal, SVG-OM, and SVG-RCA), HTN, HLD and DM 2.  She moved to [place] area in 06-Aug-2011 after her husband died.  In 08/05/2012, she underwent cardiac catheterization and underwent DES to RCA.  She is a former patient of [name].  Echocardiogram in 05-Aug-2013 showed normal EF, mild LVH, mild to moderate MR, mild TR and PA pressure 39 mmHg.  She has many allergies including amlodipine and spironolactone.  She also has history of labile hypertension.  In the past, she became dehydrated on diuretic therapy.  Echocardiogram in July 2016 showed EF 55 to 60%, mild MR, mild LAE, PA pressure 32 mmHg.     Myoview obtained on 03/21/2015 was low risk, very small region of mild ischemia in the basal anterolateral wall, EF 65%.  Repeat cardiac catheterization in June 2018 revealed EF 55 to 60%, significant multivessel CAD with 70% diffuse proximal LAD lesion, total occlusion of left circumflex and OM1, 75% proximal RCA lesion with occluded RCA prior to acute marginal.  She had patent LIMA to mid LAD, SVG to distal left circumflex marginal had small to 60% mid body stenosis with TIMI-3 flow, SVG to distal RCA has 60% stenosis followed by 99% thrombotic stenosis beyond a stented segment.  She underwent successful PCI to SVG to RCA with insertion of 3.5 x 32 mm Synergy DES postdilated to 3.71 mm.  It was recommended she continue dual antiplatelet therapy indefinitely.  She takes Effient but does not tolerate Plavix or Brilinta.  She is intolerant of statins as well.  Repeat cardiac catheterization on 06/22/2019 showed restenosis of the previously placed stent to SVG to RCA and  also vein graft to left circumflex, she eventually underwent staged intervention with stenting of SVG to OM 1 and PTCA for in-stent restenosis of SVG to RCA.  Her anginal symptom was abdominal pain.   I last saw the patient in September 2023 for chest discomfort. Myoview obtained in October 2023 was a low risk study with EF 66%, small sized moderate intensity fixed basal to mid inferolateral perfusion defect likely artifact, no significant reversible blockage. She has underwent evaluation at [facility] in August 05, 2022 for suspected mesenteric ischemia. She was seen by [facility] vascular surgery service who felt her symptom was not typical of mesenteric ischemia symptom.  I last saw the patient on 05/25/2023 at which time she had a lot of abdominal swelling and epigastric pain for 4 days.  She was seen in the ED for significant right-sided abdominal pain.  Lipase was normal.  Urinalysis shows small hemoglobin, moderate leukocyte and rare bacteria.  Renal function and electrolyte normal.  Red blood cell count normal.  Abdominal CT showed no evidence of bowel obstruction, no acute inflammatory process, colonic diverticulosis without diverticulitis, umbilical hernia containing a portion of the distal small bowel loop without proximal obstruction or strangulation.  Patient did not want any pain medication due to allergies and wanted to be seen by GI.  GI service was contacted who recommended outpatient follow-up with Dr. [name].  1 saw the patient in the clinic on 05/25/2023, she continued to  have significant epigastric pain which is constant.  It was worse with deep palpation and deep.  Inspiration and body rotation.  I suspect this symptom was more GI in nature.  I discussed the case with DOD Dr. [name] at the time, patient was quite tearful as she also had abdominal pain with previous cardiac issue as well.  We ultimately decided to repeat a stress test on 05/27/2023, this came back low risk, no evidence of ischemia  or infarction, EF 67%.  Lower extremity ABI was obtained which showed normal ABI but some degree of arterial obstruction.  Since the last office visit, patient has been seen by GI service at [facility] Dr. [name] at which time she continued to have abdominal distention and abdominal pain.   Patient presents today for follow-up.  Her blood pressure remain elevated, however she continued to have abdominal discomfort.  She has been seen by GI service at [facility] and the plan is to see her vascular surgeon as well.  She denies any chest pain.  Her abdominal discomfort is essentially all the time.  We discussed the recent ABI and Myoview, both were reassuring.  I have recommended 53-month follow-up with Dr. [name] for follow-up.  She is still under a lot of stress as her son has significant cardiomyopathy.  Otherwise, she does not have any significant lower extremity edema, orthopnea or PND.  She need prior authorization for her Praluent which I will obtain today.  ROS:   Patient continues to have significant abdominal pain, she has no shortness of breath or chest discomfort  Studies Reviewed: .        Cardiac Studies & Procedures   CARDIAC CATHETERIZATION  CARDIAC CATHETERIZATION 06/23/2019  Narrative  SVG.  Prox Graft to Mid Graft lesion is 90% stenosed.  A drug-eluting stent was successfully placed using a STENT SYNERGY DES 3.5X32.  Post intervention, there is a 0% residual stenosis.  SVG.  Dist Graft lesion is 95% stenosed.  Balloon angioplasty was performed using a BALLOON SAPPHIRE [place] 3.5X15.  Post intervention, there is a 0% residual stenosis.  1. Successful PCI of the SVG to OM with DES x 1 2. Successful PCI of the SVG to RCA with POBA.  Plan: DAPT for at least one year and I would consider indefinitely. Anticipate DC in am if stable.  Findings Coronary Findings Diagnostic  Dominance: Right  Left Main The left main was normal and bifurcated into the LAD and left circumflex  vessel.  Left Anterior Descending The LAD had diffuse 70% proximal stenosis.  A flush and fill phenomena was seen after the takeoff of the second diagonal vessel due to competitive LIMA flow. Ost LAD to Prox LAD lesion is 70% stenosed. Mid LAD lesion is 90% stenosed.  Left Circumflex The left circumflex and OM1 branch were occluded proximally. Prox Cx to Mid Cx lesion is 100% stenosed.  First Obtuse Marginal Branch 1st Mrg lesion is 100% stenosed.  Right Coronary Artery The RCA had diffuse 75% proximal stenosis.  After the RV marginal branch, the RCA was occluded before the acute margin. Prox RCA lesion is 75% stenosed. Mid RCA lesion is 100% stenosed.  Right Ventricular Branch RV Branch lesion is 90% stenosed.  Right Posterior Descending Artery RPDA lesion is 70% stenosed.  Second Right Posterolateral Branch 2nd RPL lesion is 75% stenosed.  Saphenous Graft To 2nd Mrg SVG. Prox Graft to Mid Graft lesion is 90% stenosed.  Sequential LIMA LIMA Graft To Ost 2nd Diag, Dist LAD  LIMA.  Saphenous Graft To Ost RPDA SVG. Non-stenotic Mid Graft lesion was previously treated. Non-stenotic Dist Graft lesion was previously treated. The lesion is thrombotic.  Graft To RPDA Mid Graft lesion is 20% stenosed. The lesion was previously treated. Dist Graft lesion is 95% stenosed. The lesion was previously treated.  Sequential LIMA Graft To 1st Diag, Dist LAD  Intervention  Prox Graft to Mid Graft lesion (Saphenous Graft To 2nd Mrg) Stent CATHETER LAUNCHER 6FR AL1 guide catheter was inserted. Lesion crossed with guidewire using a WIRE ASAHI PROWATER 180CM. Pre-stent angioplasty was not performed. A drug-eluting stent was successfully placed using a STENT SYNERGY DES 3.5X32. Maximum pressure: 14 atm. Stent strut is well apposed. Post-stent angioplasty was not performed. Post-Intervention Lesion Assessment The intervention was successful. Embolic protection device was deployed.  Pre-interventional TIMI flow is 3. Post-intervention TIMI flow is 3. No complications occurred at this lesion. There is a 0% residual stenosis post intervention.  Dist Graft lesion (Graft To RPDA) Angioplasty CATHETER LAUNCHER 6FR MP1 guide catheter was inserted. WIRE ASAHI PROWATER 180CM guidewire used to cross lesion. Balloon angioplasty was performed using a BALLOON SAPPHIRE [place] 3.5X15. Maximum pressure: 16 atm. Post-Intervention Lesion Assessment The intervention was successful. Embolic protection device was not deployed. Pre-interventional TIMI flow is 3. Post-intervention TIMI flow is 3. No complications occurred at this lesion. There is a 0% residual stenosis post intervention.   CARDIAC CATHETERIZATION 06/22/2019  Narrative  Bypass graft failure with high-grade obstruction and SVG to RCA (second restenosis.  90% saphenous vein graft to the obtuse marginal.  Patent sequential LIMA to diagonal and LAD.  Occluded mid right coronary  Occluded circumflex  70% proximal LAD and 90% mid LAD.  Occluded second diagonal.  Normal LV function with elevated LVEDP.  Estimated ejection fraction 65%   RECOMMENDATIONS:   IV heparin to start in 4 hours.  IV nitroglycerin if recurrent chest pain.  IV normal saline.  After speaking with Dr. Nicholaus Bloom, will pause the case and decide if repeat CABG is about option then repeated stenting of 79 year old grafts.  Findings Coronary Findings Diagnostic  Dominance: Right  Left Main The left main was normal and bifurcated into the LAD and left circumflex vessel.  Left Anterior Descending The LAD had diffuse 70% proximal stenosis.  A flush and fill phenomena was seen after the takeoff of the second diagonal vessel due to competitive LIMA flow. Ost LAD to Prox LAD lesion is 70% stenosed. Mid LAD lesion is 90% stenosed.  Left Circumflex The left circumflex and OM1 branch were occluded proximally. Prox Cx to Mid Cx lesion is 100%  stenosed.  First Obtuse Marginal Branch 1st Mrg lesion is 100% stenosed.  Right Coronary Artery The RCA had diffuse 75% proximal stenosis.  After the RV marginal branch, the RCA was occluded before the acute margin. Prox RCA lesion is 75% stenosed. Mid RCA lesion is 100% stenosed.  Right Ventricular Branch RV Branch lesion is 90% stenosed.  Right Posterior Descending Artery RPDA lesion is 70% stenosed.  Second Right Posterolateral Branch 2nd RPL lesion is 75% stenosed.  Saphenous Graft To 2nd Mrg SVG. Prox Graft to Mid Graft lesion is 90% stenosed.  Sequential LIMA LIMA Graft To Ost 2nd Diag, Dist LAD LIMA.  Saphenous Graft To Ost RPDA SVG. Previously placed Mid Graft drug eluting stent is widely patent. Previously placed Dist Graft drug eluting stent is widely patent. The lesion is thrombotic.  Graft To RPDA Mid Graft lesion is 20% stenosed. The lesion was previously  treated. Dist Graft lesion is 95% stenosed. The lesion was previously treated.  Sequential LIMA Graft To 1st Diag, Dist LAD  Intervention  No interventions have been documented.   STRESS TESTS  MYOCARDIAL PERFUSION IMAGING 05/27/2023  Interpretation Summary   Findings are consistent with no ischemia and no infarction. The study is low risk.   No ST deviation was noted.   LV perfusion is normal. There is no evidence of ischemia. There is no evidence of infarction.   Left ventricular function is abnormal. Nuclear stress EF: 67%. The left ventricular ejection fraction is hyperdynamic (>65%). End diastolic cavity size is normal. End systolic cavity size is normal.   Prior study available for comparison from 06/08/2022.   ECHOCARDIOGRAM  ECHOCARDIOGRAM COMPLETE 06/22/2019  Narrative ECHOCARDIOGRAM REPORT    Patient Name:   Ruth [name] Date of Exam: 06/22/2019 Medical Rec #:  [id number]         Height:       64.0 in Accession #:    [id number]        Weight:       185.0 lb Date of Birth:   [birth year]          BSA:          1.89 m Patient Age:    73 years          BP:           169/91 mmHg Patient Gender: F                 HR:           68 bpm. Exam Location:  Inpatient  Procedure: 2D Echo, Cardiac Doppler and Color Doppler  Indications:    Chest pain  History:        Patient has prior history of Echocardiogram examinations, most recent 03/13/2015. CAD; Prior CABG Risk Factors:Hypertension, Dyslipidemia and Diabetes.  Sonographer:    Lavenia Atlas Referring Phys: [id number] LAURA R INGOLD  IMPRESSIONS   1. Left ventricular ejection fraction, by visual estimation, is 60 to 65%. The left ventricle has normal function. Normal left ventricular size. Left ventricular septal wall thickness was mildly increased. Mildly increased left ventricular posterior wall thickness. There is mildly increased left ventricular hypertrophy. 2. Left ventricular diastolic Doppler parameters are consistent with impaired relaxation pattern of LV diastolic filling. 3. Global right ventricle has normal systolic function.The right ventricular size is normal. No increase in right ventricular wall thickness. 4. Left atrial size was normal. 5. Right atrial size was normal. 6. Mild calcification of the anterior mitral valve leaflet(s). 7. Mild thickening of the anterior mitral valve leaflet(s). 8. The mitral valve is normal in structure. Trace mitral valve regurgitation. No evidence of mitral stenosis. 9. The tricuspid valve is normal in structure. Tricuspid valve regurgitation is trivial. 10. The aortic valve is normal in structure. Aortic valve regurgitation was not visualized by color flow Doppler. Mild aortic valve sclerosis without stenosis. 11. Mildly calcified and thickened aortic valve leaflets. 12. The pulmonic valve was normal in structure. Pulmonic valve regurgitation is not visualized by color flow Doppler. 13. Mildly elevated pulmonary artery systolic pressure. 14. The inferior vena cava is  normal in size with greater than 50% respiratory variability, suggesting right atrial pressure of 3 mmHg.  FINDINGS Left Ventricle: Left ventricular ejection fraction, by visual estimation, is 60 to 65%. The left ventricle has normal function. Left ventricular septal wall thickness was mildly increased. Mildly increased left ventricular posterior wall thickness. There  is mildly increased left ventricular hypertrophy. Normal left ventricular size. Spectral Doppler shows Left ventricular diastolic Doppler parameters are consistent with impaired relaxation pattern of LV diastolic filling. Indeterminate filling pressures.  Right Ventricle: The right ventricular size is normal. No increase in right ventricular wall thickness. Global RV systolic function is has normal systolic function. The tricuspid regurgitant velocity is 2.67 m/s, and with an assumed right atrial pressure of 3 mmHg, the estimated right ventricular systolic pressure is mildly elevated at 31.5 mmHg.  Left [facility]: Left atrial size was normal in size.  Right [facility]: Right atrial size was normal in size  Pericardium: There is no evidence of pericardial effusion.  Mitral Valve: The mitral valve is normal in structure. There is mild thickening of the anterior mitral valve leaflet(s). There is mild calcification of the anterior mitral valve leaflet(s). No evidence of mitral valve stenosis by observation. Trace mitral valve regurgitation.  Tricuspid Valve: The tricuspid valve is normal in structure. Tricuspid valve regurgitation is trivial by color flow Doppler.  Aortic Valve: The aortic valve is normal in structure. Aortic valve regurgitation was not visualized by color flow Doppler. Mild aortic valve sclerosis is present, with no evidence of aortic valve stenosis. Mildly calcified and thickened aortic valve leaflets.  Pulmonic Valve: The pulmonic valve was normal in structure. Pulmonic valve regurgitation is not visualized by color  flow Doppler.  Aorta: The aortic root, ascending aorta and aortic arch are all structurally normal, with no evidence of dilitation or obstruction.  Venous: The inferior vena cava is normal in size with greater than 50% respiratory variability, suggesting right atrial pressure of 3 mmHg.  IAS/Shunts: No atrial level shunt detected by color flow Doppler. No ventricular septal defect is seen or detected. There is no evidence of an atrial septal defect.    LEFT VENTRICLE PLAX 2D LVIDd:         4.20 cm  Diastology LVIDs:         3.20 cm  LV e' lateral:   5.77 cm/s LV PW:         1.20 cm  LV E/e' lateral: 12.5 LV IVS:        1.30 cm  LV e' medial:    6.20 cm/s LVOT diam:     2.00 cm  LV E/e' medial:  11.7 LV SV:         38 ml LV SV Index:   19.02 LVOT Area:     3.14 cm   RIGHT VENTRICLE RV Basal diam:  2.50 cm RV S prime:     3.70 cm/s TAPSE (M-mode): 2.0 cm  LEFT ATRIUM             Index       RIGHT ATRIUM           Index LA diam:        2.80 cm 1.48 cm/m  RA Area:     13.60 cm LA Vol (A2C):   27.2 ml 14.37 ml/m RA Volume:   32.60 ml  17.22 ml/m LA Vol (A4C):   28.0 ml 14.79 ml/m LA Biplane Vol: 29.1 ml 15.38 ml/m AORTIC VALVE LVOT Vmax:   92.00 cm/s LVOT Vmean:  56.800 cm/s LVOT VTI:    0.205 m  AORTA Ao Root diam: 2.80 cm  MITRAL VALVE                         TRICUSPID VALVE MV Area (PHT): 3.65 cm  TR Peak grad:   28.5 mmHg MV PHT:        60.32 msec            TR Vmax:        267.00 cm/s MV Decel Time: 208 msec MV E velocity: 72.40 cm/s  103 cm/s  SHUNTS MV A velocity: 104.00 cm/s 70.3 cm/s Systemic VTI:  0.20 m MV E/A ratio:  0.70        1.5       Systemic Diam: 2.00 cm   Chilton Si MD Electronically signed by Chilton Si MD Signature Date/Time: 06/22/2019/1:34:36 PM    Final             Risk Assessment/Calculations:            Physical Exam:   VS:  BP (!) 160/70 (BP Location: Left Arm, Patient Position: Sitting, Cuff Size:  Large)   Pulse 65   Ht 5\' 1"  (1.549 m)   Wt 181 lb (82.1 kg)   SpO2 99%   BMI 34.20 kg/m    Wt Readings from Last 3 Encounters:  07/13/23 181 lb (82.1 kg)  05/27/23 179 lb (81.2 kg)  05/25/23 179 lb (81.2 kg)    GEN: Well nourished, well developed in no acute distress NECK: No JVD; No carotid bruits CARDIAC: RRR, no murmurs, rubs, gallops RESPIRATORY:  Clear to auscultation without rales, wheezing or rhonchi  ABDOMEN: Soft, non-tender, non-distended EXTREMITIES:  No edema; No deformity   ASSESSMENT AND PLAN: .    CAD s/p CABG: Her previous anginal symptom was abdominal pain however patient also has significant chronic abdominal discomfort as well and has been followed by GI service at [facility] and also vascular surgery for questionable mesenteric ischemia.  Recent Myoview was negative for any ischemia or infarction.  My suspicion that her recent abdominal pain is most likely GI in nature and unlikely to be cardiac.  Hypertension: Blood pressure remained elevated today, however patient has constant abdominal discomfort and quite anxious.  Therefore did not further drop her blood pressure.  Hyperlipidemia: On Praluent.  Will need to do prior authorization  DM2: Followed by primary care provider.  Abdominal discomfort: Followed by GI service.  She was previously seen by vascular surgery for possible mesenteric ischemia who felt her symptom was inconsistent with mesenteric ischemia.       Dispo: Follow-up with Dr. Tresa Endo in 3-month  Signed, Azalee Course, [place]

## 2023-07-13 NOTE — Patient Instructions (Signed)
Medication Instructions:  NO CHANGES *If you need a refill on your cardiac medications before your next appointment, please call your pharmacy*   Lab Work: NO LABS If you have labs (blood work) drawn today and your tests are completely normal, you will receive your results only by: MyChart Message (if you have MyChart) OR A paper copy in the mail If you have any lab test that is abnormal or we need to change your treatment, we will call you to review the results.   Testing/Procedures: NO TESTING   Follow-Up: At [facility], you and your health needs are our priority.  As part of our continuing mission to provide you with exceptional heart care, we have created designated Provider Care Teams.  These Care Teams include your primary Cardiologist (physician) and Advanced Practice Providers (APPs -  Physician Assistants and Nurse Practitioners) who all work together to provide you with the care you need, when you need it.  We recommend signing up for the patient portal called "MyChart".  Sign up information is provided on this After Visit Summary.  MyChart is used to connect with patients for Virtual Visits (Telemedicine).  Patients are able to view lab/test results, encounter notes, upcoming appointments, etc.  Non-urgent messages can be sent to your provider as well.   To learn more about what you can do with MyChart, go to [email or web address].    Your next appointment:   6 month(s)  Provider:   [name], MD

## 2023-07-19 ENCOUNTER — Telehealth: Payer: Self-pay | Admitting: Physician Assistant

## 2023-07-19 NOTE — Telephone Encounter (Signed)
  Pt c/o medication issue:  1. Name of Medication: PRALUENT 75 MG/ML SOAJ   2. How are you currently taking this medication (dosage and times per day)? ADMINISTER 1 ML UNDER THE SKIN EVERY 14 DAYS   3. Are you having a reaction (difficulty breathing--STAT)? No   4. What is your medication issue? Baxter Hire is calling to ask for clinical information to start prior auth for praluent, she said, she will also fax over the form for it. She gave ref number   [id number]

## 2023-07-20 ENCOUNTER — Other Ambulatory Visit (HOSPITAL_COMMUNITY): Payer: Self-pay

## 2023-07-20 NOTE — Telephone Encounter (Signed)
FWD to prior auth team for assistance   Thank you

## 2023-07-23 ENCOUNTER — Telehealth: Payer: Self-pay | Admitting: Physician Assistant

## 2023-07-23 MED ORDER — NITROGLYCERIN 0.4 MG SL SUBL: 0.4000 mg | SUBLINGUAL_TABLET | SUBLINGUAL | 11 refills | Status: AC | PRN

## 2023-07-23 NOTE — Telephone Encounter (Signed)
*  STAT* If patient is at the pharmacy, call can be transferred to refill team.   1. Which medications need to be refilled? (please list name of each medication and dose if known) nitroGLYCERIN (NITROSTAT) 0.4 MG SL tablet    2. Would you like to learn more about the convenience, safety, & potential cost savings by using the [facility]? No      3. Are you open to using the [facility] (Type [facility]. No ).   4. Which pharmacy/location (including street and city if local pharmacy) is medication to be sent to? [id number] - [place], [place] - [address] AT [facility] [address]    5. Do they need a 30 day or 90 day supply? 30

## 2023-07-23 NOTE — Telephone Encounter (Signed)
Pt's medication was sent to pt's pharmacy as requested. Confirmation received.  °

## 2023-07-29 ENCOUNTER — Telehealth: Payer: Self-pay | Admitting: Physician Assistant

## 2023-07-29 NOTE — Telephone Encounter (Signed)
Patient identification verified by 2 forms. Marilynn Rail, RN    Called and spoke to patient  Patient states:   -insurance is denying coverage for Praluent prescription   -one of the reason for denial is because she may need to try a pill version   -she is allergic to statins and unable to tolerate  Informed patient nursing will outreach pharmacy to check is a PA is needed  Patient has no questions at this time

## 2023-07-29 NOTE — Telephone Encounter (Signed)
Called and spoke to [employer]  RN inquired if Praluent injection needs a PA  Pharmacy states Rx does not a PA, it is available for pick up at no charge

## 2023-07-29 NOTE — Telephone Encounter (Signed)
Pt states her medication is not being renewed. She states her insurance company wants her to take a pill instead of injection. Please advise.    Insurance: [phone]

## 2023-07-29 NOTE — Telephone Encounter (Signed)
Patient identification verified by 2 forms. Marilynn Rail, RN    Called and spoke to patient  Informed patient per pharmacy Rx available for pick up at no cost  Patient states:   -is aware Rx is available for pick up   -is concerned because medication approval was denied for next year   -a new approval needs to be submitted before 12/31 Informed patient message sent to Prior auth team for assistance  Patient verbalized understanding, no questions at this time

## 2023-07-30 ENCOUNTER — Other Ambulatory Visit (HOSPITAL_COMMUNITY): Payer: Self-pay

## 2023-08-03 NOTE — Telephone Encounter (Signed)
Spoke with pt, aware per records in her chart prior auth can not be submitted until December and we will submit then. She wants to make sure the prior Berkley Harvey is sent through [facility]. She also wants to make sure they are aware she can not take statins and they caused her to pass out. Aware prior Berkley Harvey will be done in December and I will forward this information to that team.

## 2023-08-03 NOTE — Telephone Encounter (Signed)
Pt said she received letter that she has been denied. Pt would like a call back.

## 2023-08-09 ENCOUNTER — Other Ambulatory Visit (HOSPITAL_COMMUNITY): Payer: Self-pay

## 2023-08-09 NOTE — Telephone Encounter (Signed)
Patient called back to provide the number for medication [phone] and Insurance ID [id number]. Please advise

## 2023-08-10 ENCOUNTER — Other Ambulatory Visit (HOSPITAL_COMMUNITY): Payer: Self-pay

## 2023-08-10 ENCOUNTER — Telehealth: Payer: Self-pay | Admitting: Pharmacy Technician

## 2023-08-10 NOTE — Telephone Encounter (Signed)
Pharmacy Patient Advocate Encounter   Received notification from Pt Calls Messages that prior authorization for praluent is required/requested.   Insurance verification completed.   The patient is insured through  [facility]  .   Per test claim: PA required; PA submitted to above mentioned insurance via CoverMyMeds Key/confirmation #/EOC faxd Status is pending   FAxed over an appeal- patient submitted original PA and it was denied due to insufficient information. Rep stated that we still had to do an appeal. Appeal was faxed with info requested

## 2023-08-13 ENCOUNTER — Other Ambulatory Visit (HOSPITAL_COMMUNITY): Payer: Self-pay

## 2023-08-14 ENCOUNTER — Other Ambulatory Visit (HOSPITAL_COMMUNITY): Payer: Self-pay

## 2023-08-18 ENCOUNTER — Other Ambulatory Visit (HOSPITAL_COMMUNITY): Payer: Self-pay

## 2023-08-25 ENCOUNTER — Other Ambulatory Visit (HOSPITAL_COMMUNITY): Payer: Self-pay

## 2023-08-30 ENCOUNTER — Other Ambulatory Visit (HOSPITAL_COMMUNITY): Payer: Self-pay

## 2023-09-02 ENCOUNTER — Other Ambulatory Visit (HOSPITAL_COMMUNITY): Payer: Self-pay

## 2023-09-03 ENCOUNTER — Other Ambulatory Visit (HOSPITAL_COMMUNITY): Payer: Self-pay

## 2023-09-06 ENCOUNTER — Other Ambulatory Visit (HOSPITAL_COMMUNITY): Payer: Self-pay

## 2023-09-07 ENCOUNTER — Ambulatory Visit (HOSPITAL_COMMUNITY)
Admission: RE | Admit: 2023-09-07 | Discharge: 2023-09-07 | Disposition: A | Discharge status: Home / Self Care | Payer: Medicare Other | Source: Ambulatory Visit | Attending: Nurse Practitioner | Admitting: Nurse Practitioner

## 2023-09-07 ENCOUNTER — Other Ambulatory Visit (HOSPITAL_COMMUNITY): Payer: Self-pay | Admitting: Nurse Practitioner

## 2023-09-07 DIAGNOSIS — R229 Localized swelling, mass and lump, unspecified: Secondary | ICD-10-CM | POA: Diagnosis present

## 2023-09-09 ENCOUNTER — Other Ambulatory Visit: Payer: Self-pay | Admitting: Cardiovascular Disease

## 2023-09-09 DIAGNOSIS — I2510 Atherosclerotic heart disease of native coronary artery without angina pectoris: Secondary | ICD-10-CM

## 2023-09-09 DIAGNOSIS — E7800 Pure hypercholesterolemia, unspecified: Secondary | ICD-10-CM

## 2023-09-10 ENCOUNTER — Other Ambulatory Visit (HOSPITAL_COMMUNITY): Payer: Self-pay

## 2023-09-10 NOTE — Telephone Encounter (Signed)
 Pharmacy Patient Advocate Encounter  Received notification from  [facility]  that Prior Authorization for praluent  has been APPROVED from 09/06/23 to 09/06/24. Ran test claim, Copay is $53.08     one month    . This test claim was processed through [facility], [place]- copay amounts may vary at other pharmacies due to pharmacy/plan contracts, or as the patient moves through the different stages of their insurance plan.   PA #/Case ID/Reference #: [id number]

## 2023-10-28 ENCOUNTER — Other Ambulatory Visit: Payer: Self-pay | Admitting: Cardiovascular Disease

## 2023-12-22 LAB — LAB REPORT - SCANNED
A1c: 7.4
Albumin, Urine POC: 75.1
Creatinine, POC: 47.8 mg/dL
EGFR: 80.7
Microalb Creat Ratio: 157

## 2024-01-14 ENCOUNTER — Telehealth: Payer: Self-pay

## 2024-01-14 NOTE — Telephone Encounter (Signed)
   Patient Name: ASIAH PINA  DOB: 1946-06-24 MRN: [id number]  Primary Cardiologist: Magnus Schuller, MD  Chart reviewed as part of pre-operative protocol coverage. Cataract extractions are recognized in guidelines as low risk surgeries that do not typically require specific preoperative testing or holding of blood thinner therapy. Therefore, given past medical history and time since last visit, based on ACC/AHA guidelines, Jayliana L Busche would be at acceptable risk for the planned procedure without further cardiovascular testing.   I will route this recommendation to the requesting party via Epic fax function and remove from pre-op pool.  Please call with questions.  Morey Ar, NP 01/14/2024, 4:29 PM

## 2024-01-14 NOTE — Telephone Encounter (Signed)
   Pre-operative Risk Assessment    Patient Name: Ruth [name]  DOB: 10/14/1945 MRN: [id number]   Date of last office visit: 07/13/2023, Ervin Heath, PA Date of next office visit: 02/22/2024, Dr. Magnus Schuller, MD   Request for Surgical Clearance    Procedure:  Cataract Extraction 03/15/2024 and 03/29/2024  Date of Surgery:  Clearance 03/15/24 and 03/29/2024                                Surgeon: Dr. Ronni Colace, FAAO Surgeon's Group or Practice Name: [facility] Phone number: [phone] Fax number: [phone]   Type of Clearance Requested:   - Medical  per medical forms, medications do not need to be held.    Type of Anesthesia:  Topical anesthesia w/IV medication   Additional requests/questions:    Tyrus Gallus   01/14/2024, 3:07 PM

## 2024-01-17 NOTE — Telephone Encounter (Signed)
 Today received another clearance from surgeons office. Will send over all notes to surgeons office for this pt.

## 2024-02-22 ENCOUNTER — Ambulatory Visit: Attending: Cardiovascular Disease | Admitting: Cardiovascular Disease

## 2024-02-22 ENCOUNTER — Encounter: Payer: Self-pay | Admitting: Cardiovascular Disease

## 2024-02-22 VITALS — BP 158/98 | HR 62 | Ht 61.5 in | Wt 178.8 lb

## 2024-02-22 DIAGNOSIS — Z789 Other specified health status: Secondary | ICD-10-CM | POA: Diagnosis not present

## 2024-02-22 DIAGNOSIS — R140 Abdominal distension (gaseous): Secondary | ICD-10-CM

## 2024-02-22 DIAGNOSIS — I25810 Atherosclerosis of coronary artery bypass graft(s) without angina pectoris: Secondary | ICD-10-CM

## 2024-02-22 DIAGNOSIS — I10 Essential (primary) hypertension: Secondary | ICD-10-CM | POA: Diagnosis not present

## 2024-02-22 DIAGNOSIS — E785 Hyperlipidemia, unspecified: Secondary | ICD-10-CM

## 2024-02-22 DIAGNOSIS — K5790 Diverticulosis of intestine, part unspecified, without perforation or abscess without bleeding: Secondary | ICD-10-CM

## 2024-02-22 DIAGNOSIS — Z889 Allergy status to unspecified drugs, medicaments and biological substances status: Secondary | ICD-10-CM

## 2024-02-22 DIAGNOSIS — Z951 Presence of aortocoronary bypass graft: Secondary | ICD-10-CM

## 2024-02-22 MED ORDER — AMLODIPINE BESYLATE 10 MG PO TABS
10.0000 mg | ORAL_TABLET | Freq: Every day | ORAL | 3 refills | Status: AC
Start: 2024-02-22 — End: 2024-05-22

## 2024-02-22 NOTE — Patient Instructions (Signed)
 Medication Instructions:  STOP AMLODIPINE  2.5 MG STOP AMLODIPINE  5 MG START AMLODIPINE  10 MG TAKE ONE TABLET DAILY *If you need a refill on your cardiac medications before your next appointment, please call your pharmacy*  Follow-Up: At [facility], you and your health needs are our priority.  As part of our continuing mission to provide you with exceptional heart care, our providers are all part of one team.  This team includes your primary Cardiologist (physician) and Advanced Practice Providers or APPs (Physician Assistants and Nurse Practitioners) who all work together to provide you with the care you need, when you need it.  Your next appointment:   3 month(s)  Provider:   Hao Meng, PA-C      Then, Randene Bustard, MD will plan to see you again in 6-8 month(s).  We recommend signing up for the patient portal called MyChart.  Sign up information is provided on this After Visit Summary.  MyChart is used to connect with patients for Virtual Visits (Telemedicine).  Patients are able to view lab/test results, encounter notes, upcoming appointments, etc.  Non-urgent messages can be sent to your provider as well.   To learn more about what you can do with MyChart, go to [email or web address].   Other Instructions MONITOR BLOOD PRESSURE

## 2024-02-22 NOTE — Progress Notes (Unsigned)
 Patient ID: Ruth [name], female   DOB: 18-Nov-1945, 78 y.o.   MRN: [id number]       Primary M.D.: Dr. Aldo Hun  HPI: Ruth [name] is a 78 year-old female who presents for a 6 month follow-up cardiology evaluation.  Ruth [name] has a history of coronary artery disease.  While living in [place]  she developed chest discomfort and in 03-16-05 underwent CABG surgery 4 at the [facility] in [place] by Dr. Clemetine Cypher.  She had a LIMA to LAD, SVG to diagonal, SVG to obtuse marginal, and SVG to the RCA.   Her husband died in 03/17/11 and she moved to the [place] area.  In 03-16-2012, she experienced symptoms of increasing shortness of breath.  She underwent cardiac catheterization and a stent was placed in the vein graft to her RCA.  She states that she has not had any subsequent stress testing.  She is a former patient of Dr. Berry Bristol and an echocardiogram in July 2014 showed normal LV size and function with mild concentric left ventricular hypertrophy.  She had mild to moderate mitral regurgitation, mild tricuspid regurgitation, and mild primary hypertension with an estimated PA pressure 39 mm.  She has a history of hypertension for at least 15-20 years.  She states her blood pressure at home typically is in the 145-150 range, but her blood pressure is always elevated when she goes to the doctor's office.  She has a history of hyperlipidemia and apparently has been intolerant to statins but has never tried Zetia .  There is a history of diabetes mellitus but is not on therapy and states this is diet controlled.    She admits to  increased stress.  Her mother died in 09-Apr-2016and recently her dog died.  She does not routinely exercise.  When I initially saw her in 2016/07/10she has been taking amlodipine  5 mg, losartan  50 mg twice a day and metoprolol  50 mrem twice a day for hypertension and CAD.  She has a history of multiple allergies.  She states that she is Intolerant to amlodipine   as well as Spironolactone .  She has been having labile blood pressure.  In the past. She became dehydrated on diarrhetic therapy.    An echo Doppler study on 03/13/2015  showed an ejection fraction at 55-60%.  There was mild mitral regurgitation, mild left atrial dilatation, and very mild pulmonary hypertension with an estimated PA pressure 32 mm.  A nuclear perfusion study done on 03/21/2015 was low risk and demonstrated a very small region of mild ischemia in the basal anterolateral wall.  She had normal LV function with an EF of 65%.  She developed recurrent episodes of chest pain in Jul 10, 2018which  led to her undergoing definitive cardiac catheterization on 03/01/2017.  She was found to have preserved global LV contractility with an EF of 55-60%.  There was significant multivessel CAD with 70% diffuse proximal LAD stenoses, total occlusion of the circumflex and OM1 proximally, and 75% proximal RCA stenosis with occlusion of the RCA prior to the acute margin.  She had a patent LIMA graft supplying the mid LAD, which may also anastomose into the diagonal vessel, but this was uncertain.  The vein graft supplying the distal marginal circumflex branch had smooth 60% proximal to mid body stenosis with TIMI-3 flow.  The vein graft supplying the distal RCA had focal 60% stenosis followed by 99% thrombotic stenosis beyond a stented segment.  She underwent successful PCI  to this vein graft to the RCA with insertion of a 3.532 mm Synergy stent postdilated to 3.71 mm with a Optometrist used for distal graft protection.  It was recommended that she continue to antiplatelet therapy indefinitely.  Ruth [name] has significant allergies to multiple medications.  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  She continues to have difficulty with blood pressure control.  She has been on losartan  50 Milligan grams twice a day, metoprolol  100 mg twice a day, and continues to take  aspirin  81 mg and Effient  10 mg.  She did not tolerate Plavix or Brilinta .  She states that she is intolerant to statins.  She has no interest in PCSK9 inhibition.    Since I saw her in October 2018, she was seen in the office at least 5 times by extenders as well as pharmacists.  She is intolerant to numerous medications.  She last saw Ruth [name], [facility] on December 02, 2017 who suggested the possibility of minoxidil but she continued to deny this medication change.  She states most recently her blood pressure at home has been ranging from 148-158 systolically and 58-68 diastolically.  She had called the office stating that she was having pain running through the area where she had a stent.  This was relieved with water and vinegar which she drinks for gas.   When I saw her in May 2019, her blood pressure continued to be elevated in the office despite taking amlodipine  2.5 mg (she stated she could not tolerate 5 mg), metoprolol  100 mg twice a day, and she was taking valsartan  160 in the morning and only three quarters of a tablet in the evening.  With her blood pressure elevation I suggested further titration of valsartan  to 160 twice daily and added Cardura  1 mg initially at night with plans to titrate to 2 mg.  I recommended follow-up hypertensive clinic evaluations with Ruth [name] as well as Ruth [name] who she has seen on numerous occasions.  He continues to admit to intolerance to numerous medications.  I saw her in February 2020 at which time she was feeling significantly better.   She continued to have blood pressure elevation in the office and told me typically blood pressure at home was ranging around 140 systolically.  Ruth [name] was admitted to the hospital with recurrent abdominal pain which is her anginal equivalent.  I recommended repeat definitive cardiac catheterization since prior to her last intervention she experienced the same symptoms which improved  following intervention to her vein graft to RCA.  Diagnostic catheterization was done on June 22, 2019 by Dr. Felipe Horton and she again had developed restenosis within the stent in the vein graft to her RCA.  She also had new stenosis of 90% in the vein graft supplying her circumflex marginal vessel.  Her LIMA to LAD and diagonal remain patent.  She had an occluded native mid RCA and circumflex and high-grade 70% proximal LAD with 90% mid LAD stenosis with an occluded second diagonal.  After much discussion we ultimately recommended PCI and the following day she underwent successful intervention with stenting to the vein graft supplying her circumflex marginal vessel and PTCA for the in-stent restenosis in the vein graft supplying her RCA.  Subsequently, her symptoms have improved.  Of note, during her hospitalization on presentation she had marked hyperlipidemia highly suggestive of familial hyperlipidemia with total cholesterol at 300, LDL cholesterol 224 and triglycerides 183.  In the  past she has not been able to tolerate statins or Zetia .  I recommended initiation of PCSK9 therapy.   She had called the office  with complaints of elevated blood pressure.  Amlodipine  was increased to 7.5 mg daily.  She was to continue to take and record blood pressure readings daily.  She again called the office on August 18, 2019 with complaints of a sharp epigastric discomfort that she felt was gas but Gas-X only partially improved her symptoms.  She was w evaluated by Ruth Shell, NP  in the office on August 21, 2019 for follow-up evaluation.  She was having some frequent burping and gas symptoms.  Her blood pressure was elevated at 158/92.  She had undergone prior abdominal ultrasound which was negative for cholelithiasis.  GI evaluation was recommended.   I saw her in a telemedicine visit in February 2021.  At that time she felt improved.  Since initiating Praluent , lipid studies had improved from an LDL of 224   To 123 on September 29, 2019.  I mentioned to her that she was only on the 75 mg dose of Praluent  and my recommendation would be to titrate this to the 150 mg dose.  She also continued to have blood pressure lability and was adamant about increasing medications.  I saw her in a telemedicine visit in February 2021.  Her blood pressure continued to be elevated and I recommended she increase amlodipine  to 5 mg.  Her LDL cholesterol was improved but remained elevated at 123 and I again recommended increasing Praluent  to 150 mg rather than the reduced dose of 75 mg every 2 weeks.  I saw her in November 2021.  Over the prior year she had  issues with diverticulitis and irritable bowel syndrome.  She also has had urinary issues and underwent cystoscopy by Dr. Manuel Sell.  She was in need for Praluent  and we had obtained a sample of her next dose.  She denies any recurrent chest pain.  Since I last saw her, she was recently notified by Ruth [name] after the patient had called our answering service regarding blood pressure concerns.  She admitted that she gets very anxious and at times her blood pressure increases.  She was encouraged to keep a log of her blood pressure and bring it to her follow-up office visit.  I saw her on Jan 29, 2021.  At that time she told me her blood pressure typically runs around 140/70 but at night it can increase to as high as 220/105.  She states her blood pressure last night was 180/90.  She has numerous medication intolerances.  Currently she has been taking amlodipine  5 mg, hydralazine  only on a as needed basis 10 mg, metoprolol  tartrate 100 mg twice a day and valsartan  160 mg twice a day.  She was requesting another dose of Praluent  to be provided.  I checked with our pharmacist and apparently she has been approved for continued therapy.  She continues to have a lot of anxiety.  She denies chest pain.  During that evaluation, with her blood pressure lability I recommended further  titration of amlodipine  to 10 mg up from 5 mg and to take this in the morning and not wait 2 months to take a medication.  Apparently she has been staggering all her medications and was taking these medicines at 2-hour intervals throughout the day.  She continued the metoprolol  100 mg twice a day and had a prescription for hydralazine  and continued on valsartan  160  twice a day.  I strongly recommended she increase her dose of Praluent  from 75 mg up to the 150 mg dose but she did not want to do this.  Her most recent LDL was still elevated at 952.  She was given a sample of Praluent  in the office.  I last saw her on July 30, 2021.  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  Apparently she has not taken Praluent  over the last 2 months.  She admits to right shoulder upper back tenderness.  She never increased amlodipine  to 10 mg and is taking 7.5 mg daily.  She continues to be on irbesartan  1 and 50 mg, Toprol  tartrate 100 mg twice a day valsartan  160 twice a day and is not taking hydralazine .    Since I last saw her, she had laboratory recheck by Dr. Genelle Kennedy November 17, 2021.  Chemistry was normal.  Creatinine 0.7.  Hemoglobin/hematocrit 14.1/40.2.  Lipid studies showed total cholesterol 153, triglycerides 160, HDL 32, LDL 89.  I last saw her on Jan 23, 2022.  She continues to have multiple drug intolerances.  She is no longer taking hydralazine  daily but has this on a as needed basis.  She has been taking amlodipine  7.5 mg and a 5 mg and 2.5 regimen which she takes 2 hours apart.  She is no longer taking Lindzen's.  She continues to be on metoprolol  tartrate 100 mg twice a day, valsartan  160 mg twice a day, prasugrel  10 mg daily, and is now back on low-dose Praluent  at 75 mcg/mL.  She has had difficulty with diverticular disease.  She has had significant abdominal bloating.  She takes Gas-X regularly.  She denied  any exertional chest tightness.    Since I last saw her, she was evaluated by Ruth Pray, NP on March 20, 2022 and most recently by Ruth [name] on June 02, 2022.  She has had issues with significant abdominal bloating and has suspected mesenteric ischemia.  She had been under significant increased stress from her son's poor health and was told that his cardiac function is less than 20%.  It was recommended that she undergo nuclear stress imaging for assessment.  Myocardial perfusion study was done on June 08, 2022 which was a low risk study and compared to a prior study in 2018 a large reversible inferolateral defect had been previously noted had resolved.  There was now only of minimal sized fixed basal to mid inferolateral perfusion defect suggestive of artifact.  Presently, she is in need to undergo clearance for colonoscopy.  She states her blood pressure in the morning typically at home is 130/52 but at night is 150/72.  She has continued to be on amlodipine  7.5 mg daily, metoprolol  tartrate 100 mg twice a day and valsartan  160 mg twice a day.  She is on Praluent  75 mg every 14 days.  She is diabetic on Humulin insulin .  With Gas-X her abdominal bloating has improved.  She has been maintained on prasugrel  10 mg for antiplatelet therapy.    Past Medical History:  Diagnosis Date   Anxiety    Aortic atherosclerosis (HCC)    CAD (coronary artery disease)    a. CABG x 4 in [place] (LIMA->LAD, VG->Diag, VG->OM, VG->RCA); b. DES to midbody of SVG-PDA/PLA 07/2012; c. 03/2015 Myoview : EF 65%, small defect of mild severit in basal inferolateral wall, low risk.   Complication  of anesthesia    quit breathing when I got ?Inovar (07/28/2012)   Coronary atherosclerosis    Depression    Diverticulitis    Diverticulosis    Dyslipidemia    Intolerant of statins   Fecal incontinence    GERD (gastroesophageal reflux disease)    Hyperlipidemia    Hyperplastic colon polyp    IBS  (irritable bowel syndrome)    Labile hypertension    a. 09/2016 Renal Artery duplex: nl renal arteries.   Migraines    had them in my 30's (07/28/2012)   Multiple allergies    Obesity    Steatohepatitis    Tubular adenoma of colon    Type II diabetes mellitus (HCC)    a. no meds, diet controlled - takes cinnamon.    Past Surgical History:  Procedure Laterality Date   ABDOMINAL HYSTERECTOMY  1970's   BREAST LUMPECTOMY     bilateral   CARDIAC CATHETERIZATION  2006   CORONARY ANGIOPLASTY WITH STENT PLACEMENT  07/28/2012   1; first time for me (07/28/2012)   CORONARY ARTERY BYPASS GRAFT  2006   CABG X4   CORONARY STENT INTERVENTION N/A 03/01/2017   Procedure: Coronary Stent Intervention;  Surgeon: Millicent Ally, MD;  Location: [facility] INVASIVE CV LAB;  Service: Cardiovascular;  Laterality: N/A;   CORONARY STENT INTERVENTION N/A 06/23/2019   Procedure: CORONARY STENT INTERVENTION;  Surgeon: Swaziland, Peter M, MD;  Location: [facility] INVASIVE CV LAB;  Service: Cardiovascular;  Laterality: N/A;   EXCISIONAL HEMORRHOIDECTOMY  1970's   INGUINAL HERNIA REPAIR  1970's?   left   LEFT HEART CATH AND CORS/GRAFTS ANGIOGRAPHY N/A 03/01/2017   Procedure: Left Heart Cath and Cors/Grafts Angiography;  Surgeon: Millicent Ally, MD;  Location: [facility] INVASIVE CV LAB;  Service: Cardiovascular;  Laterality: N/A;   LEFT HEART CATH AND CORS/GRAFTS ANGIOGRAPHY N/A 06/22/2019   Procedure: LEFT HEART CATH AND CORS/GRAFTS ANGIOGRAPHY;  Surgeon: Arty Binning, MD;  Location: [facility] INVASIVE CV LAB;  Service: Cardiovascular;  Laterality: N/A;   LEFT HEART CATHETERIZATION WITH CORONARY/GRAFT ANGIOGRAM N/A 07/28/2012   Procedure: LEFT HEART CATHETERIZATION WITH Estella Helling;  Surgeon: Odie Benne, MD;  Location: [facility] CATH LAB;  Service: Cardiovascular;  Laterality: N/A;   PERCUTANEOUS CORONARY STENT INTERVENTION (PCI-S)  07/28/2012   Procedure: PERCUTANEOUS CORONARY STENT INTERVENTION (PCI-S);  Surgeon:  Odie Benne, MD;  Location: [facility] CATH LAB;  Service: Cardiovascular;;   TONSILLECTOMY AND ADENOIDECTOMY  ~ 1951    Allergies  Allergen Reactions   Betadine [Povidone Iodine] Shortness Of Breath and Swelling   Codeine Anaphylaxis and Shortness Of Breath    quit breathing (07/28/2012) Tolerates 1-2 shots of morphine    Demerol Anaphylaxis    quit breathing (07/28/2012)   Iohexol  Anaphylaxis    Finger/ankle swelling   Latex Anaphylaxis    quit breathing (07/28/2012)   Other Anaphylaxis    Perfume, Any Fragrance. Cleaning Fluids.  quit breathing (07/28/2012)   Percocet Céleste.Cowing ] Anaphylaxis    quit breathing; disoriented (07/28/2012)   Plavix [Clopidogrel Bisulfate] Anaphylaxis    get hot; like I'm burning up inside; had to put me in shower after OHS because of that (07/28/2012)   Red Dye #40 (Allura Red) Anaphylaxis    quit breathing (07/28/2012)   Shellfish Allergy Shortness Of Breath    broke out in knots all over (07/28/2012)   Sulfonamide Derivatives Shortness Of Breath and Rash    quit breathing (07/28/2012)   Tylenol  [Acetaminophen ] Shortness Of Breath and Itching   Azilsartan  Other reaction(s): muscle pain   Azithromycin     Other reaction(s): she cannot take it. felt like she would die. legs would not work , painful and had to get in the shower and had diarrhea.   Barbiturates     Other reaction(s): SOB   Cardizem  [Diltiazem ]     Other reaction(s): took one dose.  felt very sick, bad. said raised bp more.   Ciprofloxacin     Other reaction(s): after one does had charlie horses all over her legs and could not go back to sleep   Clonidine      Other reaction(s): dizziness   Crestor [Rosuvastatin]     Other reaction(s): RASH   Ezetimibe      Other reaction(s): RASH   Fluogen [Influenza Virus Vaccine]     Other reaction(s): gets very sick and thinks it is the mercury level.   Furosemide     Other reaction(s): makes her too  tired.   Hydralazine  Hcl     Other reaction(s): diarrhea   Hydrochlorothiazide      Other reaction(s): broke out with blisters on her mouth   Miralax [Polyethylene Glycol] Other (See Comments)    GI upset severe   Omega-3-Acid Ethyl Esters (Fish)     Other reaction(s): bad diarrhea   Pravastatin  Sodium Other (See Comments)    Leg pain, problems ambulating    Spironolactone      Other reaction(s): states she doesn't want to take it as it made her dehydrated in the past.   Statins     Muscle pain   Sulfa Antibiotics     Other reaction(s): swelling/SOB   Vancomycin     Other reaction(s): pt states it caused her major abd pain   Warfarin     Other reaction(s): CAN'T TAKE, GETS HOT/BREAKS OUT Other reaction(s): CAN'T TAKE, GETS HOT/BREAKS OUT   Imdur  [Isosorbide  Nitrate] Other (See Comments)    Intolerance. Can't remember the reaction.    Current Outpatient Medications  Medication Sig Dispense Refill   amLODipine  (NORVASC ) 2.5 MG tablet Take 2.5 mg by mouth daily.     amLODipine  (NORVASC ) 5 MG tablet TAKE 1 TABLET BY MOUTH TWICE DAILY 180 tablet 3   aspirin  EC 81 MG tablet Take 81 mg by mouth daily.     BD PEN NEEDLE NANO 2ND GEN 32G X 4 MM MISC USE THREE TIMES DAILY AS DIRECTED WITH HUMALOG AND TOUJEO      cholecalciferol (VITAMIN D ) 1000 UNITS tablet Take 1,000 Units by mouth daily.     CRANBERRY-VITAMIN C PO Take 1 tablet by mouth daily.     EPINEPHrine 0.3 mg/0.3 mL IJ SOAJ injection Inject 0.3 mg into the muscle as needed.     HUMALOG KWIKPEN 100 UNIT/ML KwikPen Inject 5 Units into the skin in the morning and at bedtime.     Insulin  Glargine (TOUJEO  MAX SOLOSTAR [place]) Inject 50 Units into the skin daily.      insulin  glargine, 1 Unit Dial , (TOUJEO  SOLOSTAR) 300 UNIT/ML Solostar Pen Inject 50 Units into the skin daily. At lunch time.     Lancets (ONETOUCH DELICA PLUS LANCET30G) MISC USE TO CHECK BLOOD SUGAR FOUR TIMES DAILY     MAGNESIUM PO Take 250 mg by mouth daily.      metoprolol  tartrate (LOPRESSOR ) 100 MG tablet TAKE 1 TABLET(100 MG) BY MOUTH TWICE DAILY (Patient taking differently: Take 100 mg by mouth 2 (two) times daily.) 180 tablet 1   nitroGLYCERIN  (NITROSTAT ) 0.4 MG SL tablet Place 1 tablet (0.4  mg total) under the tongue every 5 (five) minutes as needed for chest pain (up to 3 doses). 25 tablet 11   ONETOUCH VERIO test strip      PRALUENT  75 MG/ML SOAJ ADMINISTER 1 ML UNDER THE SKIN EVERY 14 DAYS 2 mL 11   prasugrel  (EFFIENT ) 10 MG TABS tablet TAKE 1 TABLET(10 MG) BY MOUTH DAILY 90 tablet 2   Probiotic Product (PROBIOTIC-10 PO) Take 1 capsule by mouth daily.     Simethicone  (GAS-X PO) Take by mouth.     valsartan  (DIOVAN ) 160 MG tablet Take 1 tablet (160 mg total) by mouth 2 (two) times daily. TAKE 1 TABLET(160 MG) BY MOUTH TWICE DAILY Strength: 160 mg 180 tablet 3   vitamin B-12 (CYANOCOBALAMIN ) 1000 MCG tablet Take 1,000 mcg by mouth daily.     amLODipine  (NORVASC ) 10 MG tablet Take 1 tablet (10 mg) by mouth every morning. 90 tablet 3   calcium  carbonate (TUMS - DOSED IN MG ELEMENTAL CALCIUM ) 500 MG chewable tablet Chew 1-2 tablets by mouth 3 (three) times daily as needed for heartburn.  (Patient not taking: Reported on 02/22/2024)     CREON 36000-114000 units CPEP capsule Take 36,000 Units by mouth.     nitrofurantoin (MACRODANTIN) 100 MG capsule Take 100 mg by mouth daily.     No current facility-administered medications for this visit.    Social History   Socioeconomic History   Marital status: Widowed    Spouse name: Not on file   Number of children: 2   Years of education: Not on file   Highest education level: Not on file  Occupational History   Occupation: retired  Tobacco Use   Smoking status: Never   Smokeless tobacco: Never  Vaping Use   Vaping status: Never Used  Substance and Sexual Activity   Alcohol use: No    Alcohol/week: 0.0 standard drinks of alcohol   Drug use: No   Sexual activity: Never  Other Topics Concern    Not on file  Social History Narrative   Not on file   Social Drivers of Health   Financial Resource Strain: Not on file  Food Insecurity: Low Risk  (06/02/2023)   Received from [facility]   Hunger Vital Sign    Within the past 12 months, you worried that your food would run out before you got money to buy more: Never true    Within the past 12 months, the food you bought just didn't last and you didn't have money to get more. : Never true  Transportation Needs: No Transportation Needs (06/02/2023)   Received from [facility]    In the past 12 months, has lack of reliable transportation kept you from medical appointments, meetings, work or from getting things needed for daily living? : No  Physical Activity: Not on file  Stress: Not on file  Social Connections: Unknown (01/20/2022)   Received from [facility]   Social Network    Social Network: Not on file  Intimate Partner Violence: Unknown (12/11/2021)   Received from [facility]   HITS    Physically Hurt: Not on file    Insult or Talk Down To: Not on file    Threaten Physical Harm: Not on file    Scream or Curse: Not on file   Social history is notable that she is widowed.  She has 2 children who are 5 years apart in their 36s there is no tobacco use.  She does  not routinely exercise. She has a son who had undergone CABG surgery and reportedly has a defibrillator and an ejection fraction of 35%.  Family History  Problem Relation Age of Onset   Coronary artery disease Father    Colon cancer Father    Colon polyps Father    Heart disease Father    Stroke Mother    Hypertension Other    Breast cancer Other        grandmother   Diabetes Maternal Grandmother    Diabetes Paternal Grandmother    Esophageal cancer Neg Hx    Rectal cancer Neg Hx    Stomach cancer Neg Hx    Family history is notable in that her father has heart disease and is 90 years old.  Her mother died at age 38 with a stroke.  She has  2 sisters, ages 69 and 29.  ROS General: Negative; No fevers, chills, or night sweats HEENT: Negative; No changes in vision or hearing, sinus congestion, difficulty swallowing Pulmonary: Negative; No cough, wheezing, shortness of breath, hemoptysis Cardiovascular: See HPI:  GI: Positive for left upper quadrant pain which he describes as gas.  She has issues with diverticulitis intermittently and irritable bowel syndrome.  Abdominal bloating GU: Has had some urinary issues and will be undergoing a cystoscopy Musculoskeletal: Negative; no myalgias, joint pain, or weakness Hematologic: Negative; no easy bruising, bleeding Endocrine: Positive for diabetes mellitus , which is untreated.  She has refused medications Neuro: Negative; no changes in balance, headaches Skin: Negative; No rashes or skin lesions Psychiatric: Anxiety Sleep: Negative; No snoring,  daytime sleepiness, hypersomnolence, bruxism, restless legs, hypnogognic hallucinations. Other comprehensive 14 point system review is negative   Physical Exam BP (!) 158/98   Pulse 62   Ht 5' 1.5 (1.562 m)   Wt 178 lb 12.8 oz (81.1 kg)   SpO2 97%   BMI 33.24 kg/m    Repeat blood pressure by me was 164/64.  She states her blood pressure at home may be 140/60 2 in the morning but during the day may significantly increase.  Wt Readings from Last 3 Encounters:  02/22/24 178 lb 12.8 oz (81.1 kg)  07/13/23 181 lb (82.1 kg)  05/27/23 179 lb (81.2 kg)    General: Alert, oriented, no distress.  Skin: normal turgor, no rashes, warm and dry HEENT: Normocephalic, atraumatic. Pupils equal round and reactive to light; sclera anicteric; extraocular muscles intact;  Nose without nasal septal hypertrophy Mouth/Parynx benign; Mallinpatti scale 3 Neck: No JVD, no carotid bruits; normal carotid upstroke Lungs: clear to ausculatation and percussion; no wheezing or rales Chest wall: without tenderness to palpitation Heart: PMI not displaced,  RRR, s1 s2 normal, 1/6 systolic murmur, no diastolic murmur, no rubs, gallops, thrills, or heaves Abdomen: soft, nontender; no hepatosplenomehaly, BS+; abdominal aorta nontender and not dilated by palpation. Back: no CVA tenderness Pulses 2+ Musculoskeletal: full range of motion, normal strength, no joint deformities Extremities: no clubbing cyanosis or edema, Homan's sign negative  Neurologic: grossly nonfocal; Cranial nerves grossly wnl Psychologic: Normal mood and affect  July 14, 2022 ECG (independently read by me): Sinus bradycardia at 58, PRWP anteriolry   Jan 23, 2022 ECG (independently read by me): NSR at 61, NSSTT changes; QTc 457 msec    July 30, 2021 ECG (independently read by me):  NSR at 64; NSST changes; QTc 464 msec  Jan 29, 2021 ECG (independently read by me): NSR at 65; nonspecific ST changes  November 17, 2021ECG (independently read by  me): Normal sinus rhythm at 63 bpm.  Normal intervals.  Mild T wave abnormality in aVL  October 2020 ECG (independently read by me): Normal sinus rhythm 64 bpm.  Nonspecific ST changes.   February 2020 ECG (independently read by me): NSR ay 68; nonspecific lateral T wave abnormality.  Normal intervals.  No ectopy.  May 2019 ECG (independently read by me): Normal sinus rhythm at 66 bpm.  Lateral T wave abnormality in leads I and aVL.  Normal intervals.  October 2018 ECG (independently read by me): Normal sinus rhythm at 66 bpm.  Nondiagnostic T change laterally.  Normal intervals.  No ectopy.  April 2018 ECG (independently read by me): Normal sinus rhythm at 67 bpm.  PR interval 142 Ruth.  Poor anterior R-wave progression.  Lateral T changes in 1 and L.  December 2017 ECG (independently read by me): Normal sinus rhythm at 70 bpm.  Nonspecific ST changes.  No ectopy.  March 2017 ECG (independently read by me):  Normal sinus rhythm at 70 bpm. Nonspecific ST changes laterally.  03/04/2015 ECG (independently read by me): Normal sinus  rhythm at 65 bpm.  Mild T wave abnormality in lead 1 and aVL.  LABS:     Latest Ref Rng & Units 05/24/2023   11:50 AM 09/14/2022    2:29 PM 06/30/2022    1:09 PM  BMP  Glucose 70 - 99 mg/dL 409  811  914   BUN 8 - 23 mg/dL 15  16  16    Creatinine 0.44 - 1.00 mg/dL 7.82  9.56  2.13   Sodium 135 - 145 mmol/L 134  136  136   Potassium 3.5 - 5.1 mmol/L 4.0  3.7  3.8   Chloride 98 - 111 mmol/L 100  100  103   CO2 22 - 32 mmol/L 25  26  25    Calcium  8.9 - 10.3 mg/dL 9.0  9.2  9.2         Latest Ref Rng & Units 05/24/2023   11:50 AM 09/14/2022    2:29 PM 06/30/2022    1:09 PM  Hepatic Function  Total Protein 6.5 - 8.1 g/dL 7.4  7.3  7.7   Albumin 3.5 - 5.0 g/dL 4.0  3.7  4.0   AST 15 - 41 U/L 18  15  17    ALT 0 - 44 U/L 17  14  13    Alk Phosphatase 38 - 126 U/L 56  58  62   Total Bilirubin 0.3 - 1.2 mg/dL 1.0  0.7  0.9        Latest Ref Rng & Units 05/24/2023   11:50 AM 09/14/2022    2:29 PM 06/30/2022    1:09 PM  CBC  WBC 4.0 - 10.5 K/uL 9.4  7.9  9.5   Hemoglobin 12.0 - 15.0 g/dL 08.6  57.8  46.9   Hematocrit 36.0 - 46.0 % 44.1  44.1  44.4   Platelets 150 - 400 K/uL 301  313  304    Lab Results  Component Value Date   MCV 90.6 05/24/2023   MCV 90.7 09/14/2022   MCV 88.3 06/30/2022    Lab Results  Component Value Date   TSH 1.987 03/19/2015    BNP No results found for: BNP  ProBNP No results found for: PROBNP   Lipid Panel     Component Value Date/Time   CHOL 300 (H) 06/21/2019 1855   TRIG 183 (H) 06/21/2019 1855   HDL 39 (L) 06/21/2019  1855   CHOLHDL 7.7 06/21/2019 1855   VLDL 37 06/21/2019 1855   LDLCALC 224 (H) 06/21/2019 1855   LDLDIRECT 160.9 06/21/2012 0833     RADIOLOGY: No results found.  IMPRESSION:  1. Coronary artery disease involving coronary bypass graft of native heart without angina pectoris   2. Essential hypertension   3. Hyperlipidemia LDL goal <70     ASSESSMENT AND PLAN: Ruth. Leoda Savell is a 78 year old female who  is 15 years status post CABG revascularization surgery 4  at the [facility] in [place] in 2006.  She is status post remote DES stenting to the vein graft supplying her RCA territory.  She developed recurrent anginal symptomatology leading to repeat cardiac catheterization on 03/01/2017. 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  She has had issues with blood pressure lability.  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  There also was active inflammation on the right at C2-3 and C3-4 there was concern for possible left radiculopathy.  When I saw her at aprior evaluation I recommended she change the way she took her medications and apparently at that time she was taking medications every 2 hours throughout the day.  She has always been hesitant to make certain changes and has many drug allergies.  He has intolerances to numerous medications.  Most recently she has been taking amlodipine  5 mg and 2 hours later an additional 2.5 mg which she has tolerated.  She continues to be on metoprolol  tartrate 100 mg twice a day, valsartan  160 mg twice a day.  Blood pressure continues to be elevated.  I have suggested changing amlodipine  to 10 mg but she would prefer taking a 5 twice a day regimen rather than 10 mg daily.  Reviewed her most recent lipid studies.  All these are significantly improved since she has been on low-dose Praluent , I did recommend she titrate this to 150 mg regimen.  However she prefers not to do this.  In the past I had recommended an ideal LDL less than 55 if at all possible and with her LDL at 89 strongly suggested the  higher dose.  In the past she did not tolerate statins and did not tolerate Zetia . Her blood pressure today is elevated and on repeat by me was 165/78.  I have read the recommended further titration of her amlodipine  from 7.5 mg up to 10 mg daily.  She is in need to undergo colonoscopy which will be done by [facility].  With her being on Effient , she will need to hold this for 7 days prior to the procedure I have suggested that if necessary she can hold aspirin  for 5 days depending upon GI  I will see her in 6 months for reevaluation or sooner as needed.   Millicent Ally, MD, [facility]  02/22/2024 11:08 AM

## 2024-02-27 ENCOUNTER — Encounter: Payer: Self-pay | Admitting: Cardiovascular Disease

## 2024-04-24 ENCOUNTER — Telehealth: Payer: Self-pay | Admitting: Physician Assistant

## 2024-04-24 NOTE — Telephone Encounter (Signed)
 I was able to call and speak with the patient about her concerns. She stated that the office where she will be getting her cataract surgery is not requesting a clearance form, but she wants to have peace of mind and have someone call her stating that is it okay if she proceeds with the procedure. I will route this to Hao Meng who has seen her before and request that they give her a call.

## 2024-04-24 NOTE — Telephone Encounter (Signed)
 Patient is having cataracts surgery and would like to know if she needs to be cleared by cardiology. She says the office doing the procedure isn't requesting clearance--she just wants to know for herself. She says she had to be cleared for a procedure in the past. Please advise.

## 2024-04-27 NOTE — Telephone Encounter (Signed)
 I have called and spoke with Ruth [name], she is at acceptable risk to undergo cataract surgery. She says her cataract surgery is likely going to be pushed to December. I don't feel strongly about a cardiology visit prior to cataract surgery given the low risk nature.

## 2024-05-24 ENCOUNTER — Ambulatory Visit: Admitting: Physician Assistant

## 2024-07-17 ENCOUNTER — Other Ambulatory Visit: Payer: Self-pay

## 2024-07-18 MED ORDER — PRASUGREL HCL 10 MG PO TABS: 10.0000 mg | ORAL_TABLET | Freq: Every day | ORAL | 2 refills | Status: AC

## 2024-09-20 ENCOUNTER — Ambulatory Visit
Admission: EM | Admit: 2024-09-20 | Discharge: 2024-09-20 | Disposition: A | Discharge status: Home / Self Care | Attending: Physician Assistant | Admitting: Physician Assistant

## 2024-09-20 ENCOUNTER — Other Ambulatory Visit: Payer: Self-pay

## 2024-09-20 DIAGNOSIS — R300 Dysuria: Secondary | ICD-10-CM | POA: Diagnosis not present

## 2024-09-20 DIAGNOSIS — R399 Unspecified symptoms and signs involving the genitourinary system: Secondary | ICD-10-CM | POA: Insufficient documentation

## 2024-09-20 LAB — POCT URINE DIPSTICK
Bilirubin, UA: NEGATIVE
Glucose, UA: 100 mg/dL — AB
Ketones, POC UA: NEGATIVE mg/dL
Nitrite, UA: NEGATIVE
POC PROTEIN,UA: 100 — AB
Spec Grav, UA: 1.025
Urobilinogen, UA: 0.2 U/dL
pH, UA: 5.5

## 2024-09-20 MED ORDER — FOSFOMYCIN TROMETHAMINE 3 G PO PACK: 3.0000 g | PACK | Freq: Once | ORAL | 0 refills | Status: AC

## 2024-09-20 NOTE — ED Triage Notes (Signed)
 Pt presents with a chief complaint of dysuria. Sx onset was two days ago. Currently endorses burning + frequency with urination. Currently rates overall pain a 10/10. Recently finished an antibiotic dose one week ago for a separate medical issue.

## 2024-09-20 NOTE — Discharge Instructions (Addendum)
 Based on your symptoms and results of the urinalysis I believe you have a UTI I recommend the following:  I have sent in a script for Fosfomycin  to be mixed with water and taken for one dose  Please finish the entire course of the antibiotic even if you are feeling better before it is completed unless you develop an allergic reaction or are told by a medical provider to stop taking it. We have sent a sample of your urine off for a urine culture.  This will help us  determine what bacteria is causing your symptoms as well as the most appropriate antibiotic to treat it.  If we need to make any adjustments to your medication regimen and new medication will be sent to the pharmacy on file and you will be updated via phone call in MyChart. Stay well hydrated (at least 75 oz of water per day) and avoid holding your urine for prolonged periods of time. If you have any of the following please return to urgent care or go to the emergency room: Persistent symptoms, fever, trouble urinating or inability to urinate, confusion, flank pain.

## 2024-09-20 NOTE — ED Provider Notes (Signed)
 " Ruth [name]    CSN: [id number] Arrival date & time: 09/20/24  1209      History   Chief Complaint Chief Complaint  Patient presents with   Dysuria    HPI Ruth [name] is a 79 y.o. female.   HPI  Patient is here today with concern of dysuria, increased frequency has been ongoing for the past 2 days. She states she tried to take her urine in to see if she had a UTI yesterday but it was all clear. She has an extensive allergy list and had her pharmacist on the phone in clinic who stated she has been okay with taking fosfomycin  in the past.   Past Medical History:  Diagnosis Date   Anxiety    Aortic atherosclerosis    CAD (coronary artery disease)    a. CABG x 4 in 2006 [place] (LIMA->LAD, VG->Diag, VG->OM, VG->RCA); b. DES to midbody of SVG-PDA/PLA 07/2012; c. 03/2015 Myoview : EF 65%, small defect of mild severit in basal inferolateral wall, low risk.   Complication of anesthesia    quit breathing when I got ?Inovar (07/28/2012)   Coronary atherosclerosis    Depression    Diverticulitis    Diverticulosis    Dyslipidemia    Intolerant of statins   Fecal incontinence    GERD (gastroesophageal reflux disease)    Hyperlipidemia    Hyperplastic colon polyp    IBS (irritable bowel syndrome)    Labile hypertension    a. 09/2016 Renal Artery duplex: nl renal arteries.   Migraines    had them in my 30's (07/28/2012)   Multiple allergies    Obesity    Steatohepatitis    Tubular adenoma of colon    Type II diabetes mellitus (HCC)    a. no meds, diet controlled - takes cinnamon.    Patient Active Problem List   Diagnosis Date Noted   Dizziness 12/24/2020   Multiple drug allergies 12/24/2020   Dysuria 01/17/2020   Non-ST elevation (NSTEMI) myocardial infarction [facility])    Chest pain 06/21/2019   IBS (irritable bowel syndrome) 06/21/2019   Hyperbilirubinemia 06/21/2019   GERD (gastroesophageal reflux disease) 06/21/2019   Low back pain  05/11/2019   Atopic dermatitis 04/03/2019   Other specified health status 08/10/2018   Encounter for general adult medical examination without abnormal findings 06/15/2018   Noninflammatory disorder of vagina 06/13/2018   Arthralgia of right temporomandibular joint 10/28/2017   Bilateral impacted cerumen 10/28/2017   Chronic otitis externa of left ear 10/28/2017   Frequency of micturition 09/28/2017   Benign neoplasm of colon 08/24/2017   Spinal stenosis of lumbar region 08/24/2017   Fall 04/27/2017   Abnormal nuclear stress test    Other stressful life events affecting family and household 01/26/2017   Diverticula of intestine 09/28/2016   Eructation 09/28/2016   Tinea unguium 06/24/2016   Neck pain 03/12/2016   Hypertensive heart disease 05/25/2015   Hyperglycemia due to type 2 diabetes mellitus (HCC) 03/22/2015   Pure hypercholesterolemia 03/22/2015   Type II or unspecified type diabetes mellitus without mention of complication, uncontrolled 02/14/2014   Hyponatremia 02/13/2014   HTN (hypertension) 02/13/2014   Sepsis secondary to UTI (HCC) 02/12/2014   Acute pyelonephritis 02/12/2014   Pain in left leg 11/13/2013   Candidiasis 09/12/2013   Impingement syndrome of left shoulder region 02/14/2013   Spasm 12/14/2012   Unstable angina (HCC) 07/29/2012   Obesity (BMI 30-39.9) 07/29/2012   Chronic ulcer of skin (HCC) 06/16/2012  Hemorrhoids 06/16/2012   Bloating 06/15/2012   Generalized abdominal pain 06/15/2012   Family history of colon cancer 06/15/2012   Breast lump 01/11/2012   Microscopic hematuria 01/11/2012   Myalgia 11/24/2011   Malignant HTN with heart disease, w/o CHF, 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 12/30/2010   Coronary artery disease 12/30/2010   Hyperlipidemia with target LDL less than 70 12/30/2010   Diabetes mellitus (HCC) 12/30/2010   Enthesopathy of hip region 11/29/2009   RECTAL INCONTINENCE 06/13/2008    Past Surgical History:  Procedure Laterality Date    ABDOMINAL HYSTERECTOMY  1970's   BREAST LUMPECTOMY     bilateral   CARDIAC CATHETERIZATION  2006   CORONARY ANGIOPLASTY WITH STENT PLACEMENT  07/28/2012   1; first time for me (07/28/2012)   CORONARY ARTERY BYPASS GRAFT  2006   CABG X4   CORONARY STENT INTERVENTION N/A 03/01/2017   Procedure: Coronary Stent Intervention;  Surgeon: Burnard Debby LABOR, MD;  [facility] INVASIVE CV LAB;  Service: Cardiovascular;  Laterality: N/A;   CORONARY STENT INTERVENTION N/A 06/23/2019   Procedure: CORONARY STENT INTERVENTION;  Surgeon: Jordan, Peter M, MD;  Location: [facility] INVASIVE CV LAB;  Service: Cardiovascular;  Laterality: N/A;   EXCISIONAL HEMORRHOIDECTOMY  1970's   INGUINAL HERNIA REPAIR  1970's?   left   LEFT HEART CATH AND CORS/GRAFTS ANGIOGRAPHY N/A 03/01/2017   Procedure: Left Heart Cath and Cors/Grafts Angiography;  Surgeon: Burnard Debby LABOR, MD;  Location: [facility] INVASIVE CV LAB;  Service: Cardiovascular;  Laterality: N/A;   LEFT HEART CATH AND CORS/GRAFTS ANGIOGRAPHY N/A 06/22/2019   Procedure: LEFT HEART CATH AND CORS/GRAFTS ANGIOGRAPHY;  Surgeon: Claudene Victory ORN, MD;  Location: [facility] INVASIVE CV LAB;  Service: Cardiovascular;  Laterality: N/A;   LEFT HEART CATHETERIZATION WITH CORONARY/GRAFT ANGIOGRAM N/A 07/28/2012   Procedure: LEFT HEART CATHETERIZATION WITH EL BILE;  Surgeon: Lonni JONETTA Cash, MD;  Location: [facility] CATH LAB;  Service: Cardiovascular;  Laterality: N/A;   PERCUTANEOUS CORONARY STENT INTERVENTION (PCI-S)  07/28/2012   Procedure: PERCUTANEOUS CORONARY STENT INTERVENTION (PCI-S);  Surgeon: Lonni JONETTA Cash, MD;  Location: [facility] CATH LAB;  Service: Cardiovascular;;   TONSILLECTOMY AND ADENOIDECTOMY  ~ 1951    OB History   No obstetric history on file.      Home Medications    Prior to Admission medications  Medication Sig Start Date End Date Taking? Authorizing Provider  amLODipine  (NORVASC ) 10 MG tablet Take 1 tablet (10 mg total) by mouth daily.  02/22/24 05/22/24  Burnard Debby LABOR, MD  aspirin  EC 81 MG tablet Take 81 mg by mouth daily.    [provider]  BD PEN NEEDLE NANO 2ND GEN 32G X 4 MM MISC USE THREE TIMES DAILY AS DIRECTED WITH HUMALOG AND TOUJEO  06/04/20   [provider]  cholecalciferol (VITAMIN D ) 1000 UNITS tablet Take 1,000 Units by mouth daily.    [provider]  CRANBERRY-VITAMIN C PO Take 1 tablet by mouth daily.    [provider]  CREON 36000-114000 units CPEP capsule Take 36,000 Units by mouth.    [provider]  EPINEPHrine 0.3 mg/0.3 mL IJ SOAJ injection Inject 0.3 mg into the muscle as needed. 10/05/19   [provider]  HUMALOG KWIKPEN 100 UNIT/ML KwikPen Inject 5 Units into the skin in the morning and at bedtime. 06/17/20   [provider]  Insulin  Glargine (TOUJEO  MAX SOLOSTAR [place]) Inject 50 Units into the skin daily.     [provider]  insulin  glargine, 1 Unit Dial , (  TOUJEO  SOLOSTAR) 300 UNIT/ML Solostar Pen Inject 50 Units into the skin daily. At lunch time.    [provider]  Lancets (ONETOUCH DELICA PLUS LANCET30G) MISC USE TO CHECK BLOOD SUGAR FOUR TIMES DAILY 06/03/20   [provider]  MAGNESIUM PO Take 250 mg by mouth daily. 02/23/22   [provider]  metoprolol  tartrate (LOPRESSOR ) 100 MG tablet TAKE 1 TABLET(100 MG) BY MOUTH TWICE DAILY Patient taking differently: Take 100 mg by mouth 2 (two) times daily. 03/07/20   Burnard Debby LABOR, MD  nitroGLYCERIN  (NITROSTAT ) 0.4 MG SL tablet Place 1 tablet (0.4 mg total) under the tongue every 5 (five) minutes as needed for chest pain (up to 3 doses). 07/23/23   Meng, Hao, PA  ONETOUCH VERIO test strip  07/23/20   [provider]  PRALUENT  75 MG/ML SOAJ ADMINISTER 1 ML UNDER THE SKIN EVERY 14 DAYS 09/09/23   Janene Boer, [place]  prasugrel  (EFFIENT ) 10 MG TABS tablet Take 1 tablet (10 mg total) by mouth daily. 07/18/24   Meng, Hao, PA  Probiotic Product (PROBIOTIC-10 PO)  Take 1 capsule by mouth daily.    [provider]  Simethicone  (GAS-X PO) Take by mouth.    [provider]  valsartan  (DIOVAN ) 160 MG tablet Take 1 tablet (160 mg total) by mouth 2 (two) times daily. TAKE 1 TABLET(160 MG) BY MOUTH TWICE DAILY Strength: 160 mg 03/20/22   Emelia Josefa HERO, NP  vitamin B-12 (CYANOCOBALAMIN ) 1000 MCG tablet Take 1,000 mcg by mouth daily.    [provider]    Family History Family History  Problem Relation Age of Onset   Coronary artery disease Father    Colon cancer Father    Colon polyps Father    Heart disease Father    Stroke Mother    Hypertension Other    Breast cancer Other        grandmother   Diabetes Maternal Grandmother    Diabetes Paternal Grandmother    Esophageal cancer Neg Hx    Rectal cancer Neg Hx    Stomach cancer Neg Hx     Social History Social History[1]   Allergies   Betadine [povidone iodine], Codeine, Demerol, Iohexol , Latex, Other, Percocet [oxycodone-acetaminophen ], Plavix [clopidogrel bisulfate], Red dye #40 (allura red), Shellfish allergy, Sulfonamide derivatives, Tylenol  [acetaminophen ], Azilsartan, Azithromycin, Barbiturates, Cardizem  [diltiazem ], Ciprofloxacin, Clonidine , Crestor [rosuvastatin], Ezetimibe , Fluogen [influenza virus vaccine], Furosemide, Hydralazine  hcl, Hydrochlorothiazide , Miralax [polyethylene glycol (macrogol)], Omega-3-acid ethyl esters (fish), Pravastatin  sodium, Spironolactone , Statins, Sulfa antibiotics, Vancomycin, Warfarin, and Imdur  [isosorbide  nitrate]   Review of Systems Review of Systems  Constitutional:  Negative for chills and fever.  Genitourinary:  Positive for dysuria and frequency.     Physical Exam Triage Vital Signs ED Triage Vitals  Encounter Vitals Group     BP 09/20/24 1224 (!) 150/64     Girls Systolic BP Percentile --      Girls Diastolic BP Percentile --      Boys Systolic BP Percentile --      Boys Diastolic BP Percentile --      Pulse  Rate 09/20/24 1224 75     Resp 09/20/24 1224 17     Temp 09/20/24 1224 98.6 F (37 C)     Temp Source 09/20/24 1224 Oral     SpO2 09/20/24 1224 95 %     Weight --      Height --      Head Circumference --      Peak Flow --  Pain Score 09/20/24 1222 10     Pain Loc --      Pain Education --      Exclude from Growth Chart --    No data found.  Updated Vital Signs BP (!) 150/64 (BP Location: Right Arm)   Pulse 75   Temp 98.6 F (37 C) (Oral)   Resp 17   SpO2 95%   Visual Acuity Right Eye Distance:   Left Eye Distance:   Bilateral Distance:    Right Eye Near:   Left Eye Near:    Bilateral Near:     Physical Exam Vitals reviewed.  Constitutional:      General: She is awake.     Appearance: Normal appearance. She is well-developed and well-groomed.  HENT:     Head: Normocephalic and atraumatic.  Eyes:     General: Lids are normal. Gaze aligned appropriately.     Extraocular Movements: Extraocular movements intact.     Conjunctiva/sclera: Conjunctivae normal.  Pulmonary:     Effort: Pulmonary effort is normal.  Neurological:     Mental Status: She is alert and oriented to person, place, and time.  Psychiatric:        Attention and Perception: Attention and perception normal.        Mood and Affect: Mood and affect normal.        Speech: Speech normal.        Behavior: Behavior normal. Behavior is cooperative.        Thought Content: Thought content normal.        Judgment: Judgment normal.      [name] Treatments / Results  Labs (all labs ordered are listed, but only abnormal results are displayed) Labs Reviewed  POCT URINE DIPSTICK - Abnormal; Notable for the following components:      Result Value   Clarity, UA turbid (*)    Glucose, UA =100 (*)    Blood, UA moderate (*)    POC PROTEIN,UA =100 (*)    Leukocytes, UA Small (1+) (*)    All other components within normal limits  URINE CULTURE    EKG   Radiology No results  found.  Procedures Procedures (including critical care time)  Medications Ordered in [name] Medications - No data to display  Initial Impression / Assessment and Plan / [name] Course  I have reviewed the triage vital signs and the nursing notes.  Pertinent labs & imaging results that were available during my care of the patient were reviewed by me and considered in my medical decision making (see chart for details).      Final Clinical Impressions(s) / [name] Diagnoses   Final diagnoses:  Dysuria  Urinary tract infection symptoms   Patient is here today with concerns of UTI.  She states that she has had dysuria, increased urinary frequency for the past 2 days.  Urine dip is notable for leukocytes, blood, glucose and protein and is turbid in appearance.  Urine culture will be sent off for definitive rule out.  Patient has an extensive allergy and contraindication list so we will send single dose of fosfomycin  3 g to be taken by mouth for 1 dose as patient and her pharmacist states that this medication typically does not cause issues for her and she tolerates it well.  Patient reports that she has recurrent UTIs and has had multiple UTIs in the last few months.  I reviewed with patient that recurrent UTIs especially 3 within 12 months typically indicate a  need to be evaluated by a specialist such as urology.  Patient states that she will contact her PCP to arrange this.  Results of urine culture to dictate further management.  ED and return precautions reviewed and provided in AVS.  Follow-up as needed    Discharge Instructions      Based on your symptoms and results of the urinalysis I believe you have a UTI I recommend the following:  I have sent in a script for Fosfomycin  to be mixed with water and taken for one dose  Please finish the entire course of the antibiotic even if you are feeling better before it is completed unless you develop an allergic reaction or are told by a medical provider to  stop taking it. We have sent a sample of your urine off for a urine culture.  This will help us  determine what bacteria is causing your symptoms as well as the most appropriate antibiotic to treat it.  If we need to make any adjustments to your medication regimen and new medication will be sent to the pharmacy on file and you will be updated via phone call in MyChart. Stay well hydrated (at least 75 oz of water per day) and avoid holding your urine for prolonged periods of time. If you have any of the following please return to urgent care or go to the emergency room: Persistent symptoms, fever, trouble urinating or inability to urinate, confusion, flank pain.       ED Prescriptions     Medication Sig Dispense Auth. Provider   fosfomycin  (MONUROL ) 3 g PACK Take 3 g by mouth once for 1 dose. 3 g Jamin Panther E, PA-C      PDMP not reviewed this encounter.     [1]  Social History Tobacco Use   Smoking status: Never   Smokeless tobacco: Never  Vaping Use   Vaping status: Never Used  Substance Use Topics   Alcohol use: No    Alcohol/week: 0.0 standard drinks of alcohol   Drug use: No     Tejas Seawood, Rocky BRAVO, PA-C 09/21/24 1009  "

## 2024-09-21 ENCOUNTER — Telehealth: Payer: Self-pay

## 2024-09-21 NOTE — Telephone Encounter (Signed)
 This RN called patient at this time. States she has called several times. Informed patient I tried to call and left a message in her voicemail box.   Pt states she is needing another dose of fosfomycin  3 g pack called into her pharmacy. Spoke with [employer] PA. PA does not feel comfortable prescribing another dose.   Pt goes on to say that she is unsure about taking the Diflucan prescribed (at another doctor's office). Would like to know the side effects and the likelihood of her being allergic to the medicine. Pt sounds very concerned about this. States I am allergic to almost every medication. I just want to feel good about taking it and not having to worry about what could happen. This RN advised pt to call the prescribing doctor. In chart review, I cannot see medical record of this. Unsure of what exactly it was prescribed for. Pt plans to call the doctor's office to get in touch with provider that prescribed the medication.

## 2024-09-21 NOTE — Telephone Encounter (Signed)
 This RN called patient at this time. Pt access sent message stating pt had a question about prescribed medication from yesterday's visit. No answer. Left message with callback number.

## 2024-09-22 ENCOUNTER — Ambulatory Visit (HOSPITAL_COMMUNITY): Payer: Self-pay

## 2024-09-22 LAB — URINE CULTURE: Culture: >=100000 — AB

## 2024-09-22 MED ORDER — LEVOFLOXACIN 250 MG PO TABS: 750.0000 mg | ORAL_TABLET | Freq: Every day | ORAL | 0 refills | Status: DC

## 2024-09-22 NOTE — Telephone Encounter (Signed)
 Recommend 3 day course levofloxacin . Rx sent to pharmacy.

## 2024-09-25 ENCOUNTER — Telehealth: Payer: Self-pay

## 2024-09-25 MED ORDER — LEVOFLOXACIN 250 MG PO TABS: 250.0000 mg | ORAL_TABLET | Freq: Every day | ORAL | 0 refills | Status: AC

## 2024-09-25 NOTE — Telephone Encounter (Signed)
 This RN returned patient's phone call at this time. Name and DOB verified. Pt requesting results on urine culture obtained on 1/14 visit. This RN explained that there was bacteria found in her urine. Will treat with levofloxacin  sent in (by Manuelita PA on 1/16). Pt states the pharmacy was unable to fill this, unsure why. Requesting we resend the prescription in. Erin PA made aware. Instructed to stop Augmentin  per PA directions. Pharmacist will be made aware and reiterate this to patient upon pickup.

## 2024-10-05 ENCOUNTER — Other Ambulatory Visit: Payer: Self-pay | Admitting: Physician Assistant

## 2024-10-05 DIAGNOSIS — E7800 Pure hypercholesterolemia, unspecified: Secondary | ICD-10-CM

## 2024-10-05 DIAGNOSIS — I2510 Atherosclerotic heart disease of native coronary artery without angina pectoris: Secondary | ICD-10-CM
